# Patient Record
Sex: Male | Born: 1955 | Race: Black or African American | Hispanic: No | Marital: Single | Smoking: Current every day smoker
Health system: Southern US, Community
[De-identification: ages and names within clinical notes are randomized; demographics above are authoritative.]

## PROBLEM LIST (undated history)

## (undated) DIAGNOSIS — N179 Acute kidney failure, unspecified: Secondary | ICD-10-CM

## (undated) DIAGNOSIS — N189 Chronic kidney disease, unspecified: Secondary | ICD-10-CM

## (undated) DIAGNOSIS — J9819 Other pulmonary collapse: Secondary | ICD-10-CM

## (undated) DIAGNOSIS — Z87891 Personal history of nicotine dependence: Secondary | ICD-10-CM

## (undated) DIAGNOSIS — I639 Cerebral infarction, unspecified: Secondary | ICD-10-CM

## (undated) DIAGNOSIS — F32A Depression, unspecified: Secondary | ICD-10-CM

## (undated) DIAGNOSIS — F209 Schizophrenia, unspecified: Secondary | ICD-10-CM

## (undated) DIAGNOSIS — D649 Anemia, unspecified: Secondary | ICD-10-CM

## (undated) DIAGNOSIS — I209 Angina pectoris, unspecified: Secondary | ICD-10-CM

## (undated) DIAGNOSIS — D473 Essential (hemorrhagic) thrombocythemia: Secondary | ICD-10-CM

## (undated) DIAGNOSIS — I739 Peripheral vascular disease, unspecified: Secondary | ICD-10-CM

## (undated) DIAGNOSIS — E079 Disorder of thyroid, unspecified: Secondary | ICD-10-CM

## (undated) DIAGNOSIS — L8990 Pressure ulcer of unspecified site, unspecified stage: Secondary | ICD-10-CM

## (undated) DIAGNOSIS — I10 Essential (primary) hypertension: Secondary | ICD-10-CM

## (undated) DIAGNOSIS — G9341 Metabolic encephalopathy: Secondary | ICD-10-CM

## (undated) DIAGNOSIS — F329 Major depressive disorder, single episode, unspecified: Secondary | ICD-10-CM

## (undated) DIAGNOSIS — I509 Heart failure, unspecified: Secondary | ICD-10-CM

## (undated) DIAGNOSIS — E119 Type 2 diabetes mellitus without complications: Secondary | ICD-10-CM

## (undated) DIAGNOSIS — A419 Sepsis, unspecified organism: Secondary | ICD-10-CM

## (undated) HISTORY — PX: OTHER SURGICAL HISTORY: SHX169

## (undated) HISTORY — DX: Anemia, unspecified: D64.9

## (undated) HISTORY — DX: Chronic kidney disease, unspecified: N18.9

## (undated) HISTORY — DX: Metabolic encephalopathy: G93.41

## (undated) HISTORY — DX: Essential (hemorrhagic) thrombocythemia: D47.3

## (undated) HISTORY — DX: Sepsis, unspecified organism: A41.9

## (undated) HISTORY — DX: Other pulmonary collapse: J98.19

## (undated) HISTORY — DX: Pressure ulcer of unspecified site, unspecified stage: L89.90

## (undated) HISTORY — DX: Chronic kidney disease, unspecified: N17.9

---

## 2015-11-11 DIAGNOSIS — D75839 Thrombocytosis, unspecified: Secondary | ICD-10-CM

## 2015-11-11 DIAGNOSIS — G9341 Metabolic encephalopathy: Secondary | ICD-10-CM

## 2015-11-11 DIAGNOSIS — A419 Sepsis, unspecified organism: Secondary | ICD-10-CM

## 2015-11-11 HISTORY — DX: Sepsis, unspecified organism: A41.9

## 2015-11-11 HISTORY — DX: Metabolic encephalopathy: G93.41

## 2015-11-11 HISTORY — DX: Thrombocytosis, unspecified: D75.839

## 2016-02-01 ENCOUNTER — Emergency Department: Payer: Medicare Other

## 2016-02-01 ENCOUNTER — Inpatient Hospital Stay: Payer: Medicare Other

## 2016-02-01 ENCOUNTER — Inpatient Hospital Stay
Admission: EM | Admit: 2016-02-01 | Discharge: 2016-02-14 | DRG: 871 | Disposition: A | Discharge status: Hospice | Payer: Medicare Other | Attending: Internal Medicine | Admitting: Internal Medicine

## 2016-02-01 DIAGNOSIS — I509 Heart failure, unspecified: Secondary | ICD-10-CM | POA: Diagnosis not present

## 2016-02-01 DIAGNOSIS — T17500A Unspecified foreign body in bronchus causing asphyxiation, initial encounter: Secondary | ICD-10-CM | POA: Insufficient documentation

## 2016-02-01 DIAGNOSIS — Z950 Presence of cardiac pacemaker: Secondary | ICD-10-CM

## 2016-02-01 DIAGNOSIS — E876 Hypokalemia: Secondary | ICD-10-CM | POA: Diagnosis present

## 2016-02-01 DIAGNOSIS — E872 Acidosis, unspecified: Secondary | ICD-10-CM

## 2016-02-01 DIAGNOSIS — X58XXXA Exposure to other specified factors, initial encounter: Secondary | ICD-10-CM | POA: Diagnosis present

## 2016-02-01 DIAGNOSIS — Z01818 Encounter for other preprocedural examination: Secondary | ICD-10-CM

## 2016-02-01 DIAGNOSIS — J189 Pneumonia, unspecified organism: Secondary | ICD-10-CM | POA: Diagnosis not present

## 2016-02-01 DIAGNOSIS — F419 Anxiety disorder, unspecified: Secondary | ICD-10-CM | POA: Diagnosis not present

## 2016-02-01 DIAGNOSIS — G9341 Metabolic encephalopathy: Secondary | ICD-10-CM

## 2016-02-01 DIAGNOSIS — J9621 Acute and chronic respiratory failure with hypoxia: Secondary | ICD-10-CM | POA: Diagnosis not present

## 2016-02-01 DIAGNOSIS — N179 Acute kidney failure, unspecified: Secondary | ICD-10-CM | POA: Diagnosis not present

## 2016-02-01 DIAGNOSIS — E878 Other disorders of electrolyte and fluid balance, not elsewhere classified: Secondary | ICD-10-CM | POA: Diagnosis present

## 2016-02-01 DIAGNOSIS — J9690 Respiratory failure, unspecified, unspecified whether with hypoxia or hypercapnia: Secondary | ICD-10-CM

## 2016-02-01 DIAGNOSIS — Z6821 Body mass index (BMI) 21.0-21.9, adult: Secondary | ICD-10-CM

## 2016-02-01 DIAGNOSIS — R627 Adult failure to thrive: Secondary | ICD-10-CM | POA: Diagnosis present

## 2016-02-01 DIAGNOSIS — R0902 Hypoxemia: Secondary | ICD-10-CM

## 2016-02-01 DIAGNOSIS — D696 Thrombocytopenia, unspecified: Secondary | ICD-10-CM | POA: Diagnosis present

## 2016-02-01 DIAGNOSIS — Z515 Encounter for palliative care: Secondary | ICD-10-CM | POA: Diagnosis not present

## 2016-02-01 DIAGNOSIS — Z7982 Long term (current) use of aspirin: Secondary | ICD-10-CM | POA: Diagnosis not present

## 2016-02-01 DIAGNOSIS — A419 Sepsis, unspecified organism: Secondary | ICD-10-CM | POA: Diagnosis present

## 2016-02-01 DIAGNOSIS — J811 Chronic pulmonary edema: Secondary | ICD-10-CM | POA: Diagnosis present

## 2016-02-01 DIAGNOSIS — J159 Unspecified bacterial pneumonia: Secondary | ICD-10-CM | POA: Diagnosis present

## 2016-02-01 DIAGNOSIS — R64 Cachexia: Secondary | ICD-10-CM | POA: Diagnosis present

## 2016-02-01 DIAGNOSIS — R001 Bradycardia, unspecified: Secondary | ICD-10-CM | POA: Diagnosis present

## 2016-02-01 DIAGNOSIS — F17210 Nicotine dependence, cigarettes, uncomplicated: Secondary | ICD-10-CM | POA: Diagnosis present

## 2016-02-01 DIAGNOSIS — T17590A Other foreign object in bronchus causing asphyxiation, initial encounter: Secondary | ICD-10-CM | POA: Diagnosis present

## 2016-02-01 DIAGNOSIS — E860 Dehydration: Secondary | ICD-10-CM | POA: Diagnosis present

## 2016-02-01 DIAGNOSIS — R0603 Acute respiratory distress: Secondary | ICD-10-CM

## 2016-02-01 DIAGNOSIS — J9620 Acute and chronic respiratory failure, unspecified whether with hypoxia or hypercapnia: Secondary | ICD-10-CM | POA: Diagnosis not present

## 2016-02-01 DIAGNOSIS — J9819 Other pulmonary collapse: Secondary | ICD-10-CM | POA: Diagnosis not present

## 2016-02-01 DIAGNOSIS — J181 Lobar pneumonia, unspecified organism: Secondary | ICD-10-CM

## 2016-02-01 DIAGNOSIS — L8990 Pressure ulcer of unspecified site, unspecified stage: Secondary | ICD-10-CM | POA: Diagnosis not present

## 2016-02-01 DIAGNOSIS — R6521 Severe sepsis with septic shock: Secondary | ICD-10-CM | POA: Diagnosis present

## 2016-02-01 DIAGNOSIS — Z9889 Other specified postprocedural states: Secondary | ICD-10-CM

## 2016-02-01 DIAGNOSIS — D638 Anemia in other chronic diseases classified elsewhere: Secondary | ICD-10-CM | POA: Diagnosis present

## 2016-02-01 DIAGNOSIS — N170 Acute kidney failure with tubular necrosis: Secondary | ICD-10-CM | POA: Diagnosis present

## 2016-02-01 DIAGNOSIS — Z8673 Personal history of transient ischemic attack (TIA), and cerebral infarction without residual deficits: Secondary | ICD-10-CM | POA: Diagnosis not present

## 2016-02-01 DIAGNOSIS — J9809 Other diseases of bronchus, not elsewhere classified: Secondary | ICD-10-CM | POA: Diagnosis not present

## 2016-02-01 DIAGNOSIS — E870 Hyperosmolality and hypernatremia: Secondary | ICD-10-CM | POA: Diagnosis present

## 2016-02-01 DIAGNOSIS — Z4659 Encounter for fitting and adjustment of other gastrointestinal appliance and device: Secondary | ICD-10-CM

## 2016-02-01 DIAGNOSIS — Z66 Do not resuscitate: Secondary | ICD-10-CM | POA: Diagnosis not present

## 2016-02-01 DIAGNOSIS — J9600 Acute respiratory failure, unspecified whether with hypoxia or hypercapnia: Secondary | ICD-10-CM | POA: Diagnosis not present

## 2016-02-01 DIAGNOSIS — R531 Weakness: Secondary | ICD-10-CM

## 2016-02-01 DIAGNOSIS — Z79899 Other long term (current) drug therapy: Secondary | ICD-10-CM

## 2016-02-01 DIAGNOSIS — D649 Anemia, unspecified: Secondary | ICD-10-CM

## 2016-02-01 HISTORY — DX: Personal history of nicotine dependence: Z87.891

## 2016-02-01 LAB — COMPREHENSIVE METABOLIC PANEL
ALBUMIN: 3.5 g/dL (ref 3.5–5.0)
ALT: 47 U/L (ref 17–63)
AST: 56 U/L — AB (ref 15–41)
Alkaline Phosphatase: 79 U/L (ref 38–126)
BUN: 79 mg/dL — AB (ref 6–20)
CALCIUM: 8.7 mg/dL — AB (ref 8.9–10.3)
CO2: 24 mmol/L (ref 22–32)
CREATININE: 2.17 mg/dL — AB (ref 0.61–1.24)
Chloride: >130 mmol/L (ref 101–111)
GFR calc non Af Amer: 32 mL/min — ABNORMAL LOW (ref >60)
GFR, EST AFRICAN AMERICAN: 37 mL/min — AB (ref >60)
GLUCOSE: 144 mg/dL — AB (ref 65–99)
Potassium: 3.7 mmol/L (ref 3.5–5.1)
SODIUM: 172 mmol/L — AB (ref 135–145)
Total Bilirubin: 1.1 mg/dL (ref 0.3–1.2)
Total Protein: 7.1 g/dL (ref 6.5–8.1)

## 2016-02-01 LAB — NA AND K (SODIUM & POTASSIUM), RAND UR: POTASSIUM UR: 50 mmol/L

## 2016-02-01 LAB — URINALYSIS COMPLETE WITH MICROSCOPIC (ARMC ONLY)
Bilirubin Urine: NEGATIVE
Glucose, UA: NEGATIVE mg/dL
Hgb urine dipstick: NEGATIVE
KETONES UR: NEGATIVE mg/dL
Leukocytes, UA: NEGATIVE
Nitrite: NEGATIVE
PH: 5 (ref 5.0–8.0)
PROTEIN: NEGATIVE mg/dL
RBC / HPF: NONE SEEN RBC/hpf (ref 0–5)
SQUAMOUS EPITHELIAL / LPF: NONE SEEN
Specific Gravity, Urine: 1.014 (ref 1.005–1.030)
WBC, UA: NONE SEEN WBC/hpf (ref 0–5)

## 2016-02-01 LAB — BLOOD GAS, ARTERIAL
ACID-BASE DEFICIT: 3.1 mmol/L — AB (ref 0.0–2.0)
Allens test (pass/fail): POSITIVE — AB
BICARBONATE: 21.1 meq/L (ref 21.0–28.0)
FIO2: 1
O2 Saturation: 91.4 %
PH ART: 7.4 (ref 7.350–7.450)
Patient temperature: 37
pCO2 arterial: 34 mmHg (ref 32.0–48.0)
pO2, Arterial: 62 mmHg — ABNORMAL LOW (ref 83.0–108.0)

## 2016-02-01 LAB — CBC WITH DIFFERENTIAL/PLATELET
BASOS ABS: 0 10*3/uL (ref 0–0.1)
Basophils Relative: 0 %
EOS PCT: 0 %
Eosinophils Absolute: 0 10*3/uL (ref 0–0.7)
HEMATOCRIT: 35.2 % — AB (ref 40.0–52.0)
Hemoglobin: 11.5 g/dL — ABNORMAL LOW (ref 13.0–18.0)
LYMPHS ABS: 0.5 10*3/uL — AB (ref 1.0–3.6)
LYMPHS PCT: 9 %
MCH: 31 pg (ref 26.0–34.0)
MCHC: 32.7 g/dL (ref 32.0–36.0)
MCV: 94.8 fL (ref 80.0–100.0)
MONOS PCT: 7 %
Monocytes Absolute: 0.4 10*3/uL (ref 0.2–1.0)
Neutro Abs: 4.5 10*3/uL (ref 1.4–6.5)
Neutrophils Relative %: 84 %
PLATELETS: 141 10*3/uL — AB (ref 150–440)
RBC: 3.72 MIL/uL — ABNORMAL LOW (ref 4.40–5.90)
RDW: 16.2 % — AB (ref 11.5–14.5)
WBC: 5.4 10*3/uL (ref 3.8–10.6)

## 2016-02-01 LAB — BASIC METABOLIC PANEL
BUN: 80 mg/dL — AB (ref 6–20)
BUN: 80 mg/dL — ABNORMAL HIGH (ref 6–20)
BUN: 81 mg/dL — ABNORMAL HIGH (ref 6–20)
CO2: 23 mmol/L (ref 22–32)
CO2: 19 mmol/L — ABNORMAL LOW (ref 22–32)
CO2: 21 mmol/L — ABNORMAL LOW (ref 22–32)
CREATININE: 2.3 mg/dL — AB (ref 0.61–1.24)
CREATININE: 2.49 mg/dL — AB (ref 0.61–1.24)
Calcium: 8.2 mg/dL — ABNORMAL LOW (ref 8.9–10.3)
Calcium: 8.3 mg/dL — ABNORMAL LOW (ref 8.9–10.3)
Calcium: 8.1 mg/dL — ABNORMAL LOW (ref 8.9–10.3)
Chloride: >130 mmol/L (ref 101–111)
Creatinine, Ser: 2.19 mg/dL — ABNORMAL HIGH (ref 0.61–1.24)
GFR calc non Af Amer: 30 mL/min — ABNORMAL LOW (ref >60)
GFR calc non Af Amer: 27 mL/min — ABNORMAL LOW (ref >60)
GFR, EST AFRICAN AMERICAN: 36 mL/min — AB (ref >60)
GFR, EST AFRICAN AMERICAN: 34 mL/min — AB (ref >60)
GFR, EST AFRICAN AMERICAN: 31 mL/min — AB (ref >60)
GFR, EST NON AFRICAN AMERICAN: 31 mL/min — AB (ref >60)
Glucose, Bld: 177 mg/dL — ABNORMAL HIGH (ref 65–99)
Glucose, Bld: 147 mg/dL — ABNORMAL HIGH (ref 65–99)
Glucose, Bld: 156 mg/dL — ABNORMAL HIGH (ref 65–99)
POTASSIUM: 3.3 mmol/L — AB (ref 3.5–5.1)
POTASSIUM: 3.3 mmol/L — AB (ref 3.5–5.1)
Potassium: 3 mmol/L — ABNORMAL LOW (ref 3.5–5.1)
SODIUM: 169 mmol/L — AB (ref 135–145)
SODIUM: 167 mmol/L — AB (ref 135–145)
SODIUM: 167 mmol/L — AB (ref 135–145)

## 2016-02-01 LAB — URINE DRUG SCREEN, QUALITATIVE (ARMC ONLY)
Amphetamines, Ur Screen: NOT DETECTED
BARBITURATES, UR SCREEN: NOT DETECTED
BENZODIAZEPINE, UR SCRN: NOT DETECTED
COCAINE METABOLITE, UR ~~LOC~~: NOT DETECTED
Cannabinoid 50 Ng, Ur ~~LOC~~: NOT DETECTED
MDMA (Ecstasy)Ur Screen: NOT DETECTED
METHADONE SCREEN, URINE: NOT DETECTED
Opiate, Ur Screen: NOT DETECTED
Phencyclidine (PCP) Ur S: NOT DETECTED
TRICYCLIC, UR SCREEN: NOT DETECTED

## 2016-02-01 LAB — MRSA PCR SCREENING: MRSA BY PCR: NEGATIVE

## 2016-02-01 LAB — OSMOLALITY, URINE: Osmolality, Ur: 416 mOsm/kg (ref 300–900)

## 2016-02-01 LAB — LACTIC ACID, PLASMA
LACTIC ACID, VENOUS: 2.5 mmol/L — AB (ref 0.5–2.0)
LACTIC ACID, VENOUS: 1.5 mmol/L (ref 0.5–2.0)

## 2016-02-01 LAB — INFLUENZA PANEL BY PCR (TYPE A & B)
H1N1 flu by pcr: NOT DETECTED
Influenza A By PCR: NEGATIVE
Influenza B By PCR: NEGATIVE

## 2016-02-01 LAB — OSMOLALITY: OSMOLALITY: 387 mosm/kg — AB (ref 275–295)

## 2016-02-01 LAB — GLUCOSE, CAPILLARY: Glucose-Capillary: 125 mg/dL — ABNORMAL HIGH (ref 65–99)

## 2016-02-01 LAB — TROPONIN I: Troponin I: <0.03 ng/mL (ref <0.031)

## 2016-02-01 LAB — ETHANOL: Alcohol, Ethyl (B): <5 mg/dL (ref <5)

## 2016-02-01 MED ORDER — LORAZEPAM 2 MG/ML IJ SOLN: 0.5000 mg | Freq: Four times a day (QID) | INTRAMUSCULAR | Status: DC | PRN

## 2016-02-01 MED ORDER — SODIUM CHLORIDE 0.45 % IV SOLN
INTRAVENOUS | Status: DC
  Administered 2016-02-01: 17:00:00 via INTRAVENOUS

## 2016-02-01 MED ORDER — FENTANYL CITRATE (PF) 100 MCG/2ML IJ SOLN
INTRAMUSCULAR | Status: AC
  Filled 2016-02-01: qty 4

## 2016-02-01 MED ORDER — VANCOMYCIN HCL IN DEXTROSE 750-5 MG/150ML-% IV SOLN
750.0000 mg | INTRAVENOUS | Status: DC
Start: 2016-02-02 — End: 2016-02-03
  Administered 2016-02-02 – 2016-02-03 (×2): 750 mg via INTRAVENOUS
  Filled 2016-02-01 (×3): qty 150

## 2016-02-01 MED ORDER — PIPERACILLIN-TAZOBACTAM 3.375 G IVPB 30 MIN: 3.3750 g | Freq: Once | INTRAVENOUS | Status: DC

## 2016-02-01 MED ORDER — SODIUM CHLORIDE 0.9 % IV BOLUS (SEPSIS)
1000.0000 mL | Freq: Once | INTRAVENOUS | Status: AC
  Administered 2016-02-01: 1000 mL via INTRAVENOUS

## 2016-02-01 MED ORDER — HEPARIN SODIUM (PORCINE) 5000 UNIT/ML IJ SOLN
5000.0000 [IU] | Freq: Three times a day (TID) | INTRAMUSCULAR | Status: DC
  Administered 2016-02-01 – 2016-02-03 (×6): 5000 [IU] via SUBCUTANEOUS
  Filled 2016-02-01 (×6): qty 1

## 2016-02-01 MED ORDER — SODIUM CHLORIDE 0.9% FLUSH
3.0000 mL | Freq: Two times a day (BID) | INTRAVENOUS | Status: DC
  Administered 2016-02-02 – 2016-02-13 (×22): 3 mL via INTRAVENOUS

## 2016-02-01 MED ORDER — VANCOMYCIN HCL IN DEXTROSE 1-5 GM/200ML-% IV SOLN: 1000.0000 mg | Freq: Once | INTRAVENOUS | Status: DC

## 2016-02-01 MED ORDER — ONDANSETRON HCL 4 MG PO TABS: 4.0000 mg | ORAL_TABLET | Freq: Four times a day (QID) | ORAL | Status: DC | PRN

## 2016-02-01 MED ORDER — PANTOPRAZOLE SODIUM 40 MG IV SOLR
40.0000 mg | INTRAVENOUS | Status: DC
  Administered 2016-02-01 – 2016-02-07 (×7): 40 mg via INTRAVENOUS
  Filled 2016-02-01 (×7): qty 40

## 2016-02-01 MED ORDER — DEXTROSE 5 % IV SOLN
1.0000 g | Freq: Once | INTRAVENOUS | Status: AC
  Administered 2016-02-01: 1 g via INTRAVENOUS
  Filled 2016-02-01: qty 10

## 2016-02-01 MED ORDER — ONDANSETRON HCL 4 MG/2ML IJ SOLN: 4.0000 mg | Freq: Four times a day (QID) | INTRAMUSCULAR | Status: DC | PRN

## 2016-02-01 MED ORDER — IPRATROPIUM BROMIDE 0.02 % IN SOLN
0.5000 mg | RESPIRATORY_TRACT | Status: DC
  Administered 2016-02-01 – 2016-02-03 (×10): 0.5 mg via RESPIRATORY_TRACT
  Filled 2016-02-01 (×10): qty 2.5

## 2016-02-01 MED ORDER — DEXTROSE 5 % IV SOLN
500.0000 mg | INTRAVENOUS | Status: DC
  Administered 2016-02-02 – 2016-02-05 (×4): 500 mg via INTRAVENOUS
  Filled 2016-02-01 (×5): qty 500

## 2016-02-01 MED ORDER — ALBUTEROL SULFATE (2.5 MG/3ML) 0.083% IN NEBU
2.5000 mg | INHALATION_SOLUTION | RESPIRATORY_TRACT | Status: DC
  Administered 2016-02-01 – 2016-02-03 (×10): 2.5 mg via RESPIRATORY_TRACT
  Filled 2016-02-01 (×10): qty 3

## 2016-02-01 MED ORDER — DEXTROSE 5 % IV SOLN
500.0000 mg | Freq: Once | INTRAVENOUS | Status: AC
  Administered 2016-02-01: 500 mg via INTRAVENOUS
  Filled 2016-02-01: qty 500

## 2016-02-01 MED ORDER — POTASSIUM CHLORIDE 10 MEQ/100ML IV SOLN
10.0000 meq | INTRAVENOUS | Status: AC
  Administered 2016-02-01 (×4): 10 meq via INTRAVENOUS
  Filled 2016-02-01 (×4): qty 100

## 2016-02-01 MED ORDER — VANCOMYCIN HCL IN DEXTROSE 1-5 GM/200ML-% IV SOLN
1000.0000 mg | INTRAVENOUS | Status: AC
  Administered 2016-02-01: 1000 mg via INTRAVENOUS
  Filled 2016-02-01: qty 200

## 2016-02-01 MED ORDER — NOREPINEPHRINE BITARTRATE 1 MG/ML IV SOLN: 2.0000 ug/min | INTRAVENOUS | Status: DC

## 2016-02-01 MED ORDER — SODIUM CHLORIDE 0.9 % IV SOLN
750.0000 mg | Freq: Two times a day (BID) | INTRAVENOUS | Status: DC
  Administered 2016-02-02 – 2016-02-08 (×14): 750 mg via INTRAVENOUS
  Filled 2016-02-01 (×18): qty 7.5

## 2016-02-01 MED ORDER — DEXTROSE 5 % IV SOLN
Freq: Once | INTRAVENOUS | Status: AC
  Administered 2016-02-01: 20:00:00 via INTRAVENOUS

## 2016-02-01 MED ORDER — SODIUM CHLORIDE 0.9 % IV SOLN: INTRAVENOUS | Status: DC

## 2016-02-01 MED ORDER — LORAZEPAM 2 MG/ML IJ SOLN
0.2500 mg | Freq: Two times a day (BID) | INTRAMUSCULAR | Status: DC
  Administered 2016-02-02 – 2016-02-07 (×7): 0.25 mg via INTRAVENOUS
  Filled 2016-02-01 (×8): qty 1

## 2016-02-01 MED ORDER — PIPERACILLIN-TAZOBACTAM 3.375 G IVPB
3.3750 g | Freq: Three times a day (TID) | INTRAVENOUS | Status: DC
  Administered 2016-02-01 – 2016-02-10 (×26): 3.375 g via INTRAVENOUS
  Filled 2016-02-01 (×31): qty 50

## 2016-02-01 MED ORDER — NOREPINEPHRINE 4 MG/250ML-% IV SOLN
2.0000 ug/min | INTRAVENOUS | Status: DC
Start: 2016-02-01 — End: 2016-02-03
  Administered 2016-02-02: 2 ug/min via INTRAVENOUS
  Filled 2016-02-01: qty 250

## 2016-02-01 MED ORDER — MIDAZOLAM HCL 2 MG/2ML IJ SOLN
INTRAMUSCULAR | Status: AC
  Filled 2016-02-01: qty 4

## 2016-02-01 MED ORDER — RISPERIDONE 1 MG PO TBDP
1.0000 mg | ORAL_TABLET | Freq: Two times a day (BID) | ORAL | Status: DC
  Administered 2016-02-04 – 2016-02-05 (×2): 1 mg via ORAL
  Filled 2016-02-01 (×8): qty 1

## 2016-02-01 MED ORDER — ASPIRIN 300 MG RE SUPP
300.0000 mg | Freq: Every day | RECTAL | Status: DC
  Administered 2016-02-02 – 2016-02-05 (×4): 300 mg via RECTAL
  Filled 2016-02-01 (×4): qty 1

## 2016-02-01 MED ORDER — DEXTROSE 5 % IV SOLN
Freq: Once | INTRAVENOUS | Status: AC
  Administered 2016-02-01: 12:00:00 via INTRAVENOUS

## 2016-02-01 NOTE — Progress Notes (Signed)
Pharmacy Antibiotic Note  [name] is a 60 y.o. male admitted on 02/01/2016 with sepsis.  Pharmacy has been consulted for vancomycin and Zosyn dosing.  Plan: Vancomycin 1g IV STAT. Will continue vancomycin 750mg  IV Q24hr for goal trough of 15-20. Will obtain trough prior to dose on 3/28.    Will continue Zosyn EI 3.375g IV Q8hr.    Height: 5\' 7"  (170.2 cm) Weight: 128 lb 11.2 oz (58.378 kg) IBW/kg (Calculated) : 66.1  Temp (24hrs), Avg:96.7 F (35.9 C), Min:93.1 F (33.9 C), Max:99.1 F (37.3 C)   Recent Labs Lab 02/01/16 0936 02/01/16 1124 02/01/16 1239  WBC 5.4  --   --   CREATININE 2.17* 2.19*  --   LATICACIDVEN 2.5*  --  1.5    Estimated Creatinine Clearance: 30.4 mL/min (by C-G formula based on Cr of 2.19).    No Known Allergies  Antimicrobials this admission: Vancomycin 3/24 >>  Zosyn 3/24 >>  Azithromycin 3/24 >>  Ceftriaxone 3/24>>3/24   Microbiology results: 3/24 BCx x 2: pending  3/24 UCx: pending  3/24 Sputum: pending 3/24 MRSA PCR: pending  3/24 Influenza: pending  Pharmacy will continue to monitor and adjust per consult.   Simpson,Michael L 02/01/2016 3:58 PM

## 2016-02-01 NOTE — ED Notes (Signed)
Pt to ultrasound

## 2016-02-01 NOTE — ED Provider Notes (Signed)
[facility] Emergency Department Provider Note  Time seen: 9:14 AM  I have reviewed the triage vital signs and the nursing notes.   HISTORY  Chief Complaint No chief complaint on file.    HPI [name] is a 60 y.o. male with no known past medical history who presents the emergency department unresponsive by EMS. According to EMS there were called to a group home due to somnolence/unresponsiveness. EMS states the group home worker could not give any history as far as what his baseline is but states that he is normally able to care for himself. For last 2 days since yesterday the patient has been very somnolent and largely unresponsive. Upon arrival the patient keeps his eyes closed, is able to answer occasional questions, will follow very simple commands, is largely noncooperative during exam. When asked if he is in pain the patient states no. Patient is very somnolent, minimally responsive to questioning and commands.    No past medical history on file.  There are no active problems to display for this patient.   No past surgical history on file.  No current outpatient prescriptions on file.  Allergies Review of patient's allergies indicates not on file.  No family history on file.  Social History Social History  Substance Use Topics  . Smoking status: Not on file  . Smokeless tobacco: Not on file  . Alcohol Use: Not on file    Review of Systems Patient not able to adequately answer review of systems questioning.  ____________________________________________   PHYSICAL EXAM:  Constitutional: Somnolent, keeps eyes closed, will occasionally follow a very basic commands, will answer occasionally simple questions. Alert at times, mostly keeps his eyes closed. Eyes: 23 mm, PERRL ENT   Head: Normocephalic and atraumatic.   Mouth/Throat: Dry mucous membranes Cardiovascular: Normal rate, regular rhythm.  Respiratory: Normal respiratory  effort without tachypnea nor retractions. Breath sounds are clear. No rales, rhonchi, wheeze. Gastrointestinal: Soft and nontender. No distention.   Musculoskeletal: Nontender with normal range of motion in all extremities.  Neurologic:  Somnolent, minimal effort with examination, does appear to move extremities at times. Skin:  Skin is warm, dry and intact.  Psychiatric: Somnolent, altered.  ____________________________________________    EKG  EKG reviewed and interpreted by myself shows normal sinus rhythm 82 bpm, wide QRS, left axis deviation, possibly paced although no pacer spikes noted. Patient does appear to have mild ST elevations in 3 and aVF less than 2 mm, with ST changes and 1, aVL, V5, V6 with ST depressions in V5, V6.  ____________________________________________    RADIOLOGY  CT head shows no acute abnormality Chest x-ray shows right lower lobe pneumonia.  ____________________________________________ CRITICAL CARE Performed by: Harvest Dark   Total critical care time: 60 minutes  Critical care time was exclusive of separately billable procedures and treating other patients.  Critical care was necessary to treat or prevent imminent or life-threatening deterioration.  Critical care was time spent personally by me on the following activities: development of treatment plan with patient and/or surrogate as well as nursing, discussions with consultants, evaluation of patient's response to treatment, examination of patient, obtaining history from patient or surrogate, ordering and performing treatments and interventions, ordering and review of laboratory studies, ordering and review of radiographic studies, pulse oximetry and re-evaluation of patient's condition.      INITIAL IMPRESSION / ASSESSMENT AND PLAN / ED COURSE  Pertinent labs & imaging results that were available during my care of the patient were reviewed  by me and considered in my medical decision  making (see chart for details).  Patient presents with acute altered mental status since yesterday. Upon arrival the patient is very somnolent, will occasionally answer yes or no questions, but otherwise does not speak. Can follow very basic commands such as move hands or squeeze fingers, but has minimal effort and often will not follow commands. Patient found to be quite hypotensive, he has very dry mucous membranes. We have no history on this patient including past medical history. Patient is unable to give a history and his current state. We will check labs, CT head, chest x-ray, begin IV hydrating, and we'll closely monitor in the emergency department.   ----------------------------------------- 9:36 AM on 02/01/2016 -----------------------------------------  Patient is vitals show room air oxygen saturations 78%, 6 L increases to 82%. On a nonrebreather the patient's currently satting 100%. Patient's blood pressure initially quite hypotensive 75/57. After 1 L of normal saline the patient's blood pressure is 120/69. The patient is more responsive at this time. He is able to answer some questions, denies any pain, denies chest pain, denies cough or trouble breathing. Patient remained somnolent however. Patient is able to squeeze my hands and follow simple commands. Appears to be improving with IV fluids and oxygen. Currently awaiting labs, chest x-ray, EKG, CT head. At this time I do not believe the patient needs endotracheal intubation as he appears to be protecting his airway adequately, answering simple questions and following simple commands, However we will continue to very closely monitor the situation.   ----------------------------------------- 10:03 AM on 02/01/2016 -----------------------------------------  Discussed EKG findings with Dr. Fletcher Anon, given his AICD, as well as the patient does not describe any chest pain at this time we will hold off on catheter lab intervention until we  have a more clear clinical picture with laboratory results. We do not have any old EKGs for comparison. 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  ----------------------------------------- 10:39 AM on 02/01/2016 -----------------------------------------  X-ray showing right lower lobe pneumonia, given his initial hypotension I have activated sepsis protocols. We will cover with antibiotics, continue IV hydration to a total of 2 L, and admit to the hospital.  ----------------------------------------- 12:06 PM on 02/01/2016 -----------------------------------------  Patient remains hypotensive, receiving IV fluids however we do not want to drop his sodium too quickly. Patient continues to be able answer questions and follow simple commands. Mean arterial pressure is in the 60-65 range. If the patient's pressure work to lower he would likely require pressors, however this point he is maintaining well.  ____________________________________________   FINAL CLINICAL IMPRESSION(S) / ED DIAGNOSES  Altered mental status Right lower lobe pneumonia  Harvest Dark, MD 02/01/16 1208

## 2016-02-01 NOTE — Consult Note (Signed)
[facility] CONSULT NOTE    Date: 02/01/2016                  Patient Name:  [name]  MRN: [id number]  DOB: [birth year]  Age / Sex: 60 y.o., male         PCP: Vista Mink, FNP                 Service Requesting Consult: Dr. Ether Griffins                 Reason for Consult: Acute renal failure.            History of Present Illness: Patient is a 60 y.o. male with no significant past medical history , who was admitted to [facility] on 02/01/2016 for evaluation of altered mental status.  He was found have severe metabolic derangements upon admission. He was found to have a right middle lobe pneumonia. His serum sodium was very high at 167 with chloride greater than 130. BUN was high at 80 with a creatinine of 2.1. Urine output only 100 cc thus far.  Case was discussed with the patient's daughter in depth. The patient is DO NOT RESUSCITATE status however the patient's daughter would be open to a trial of renal replacement therapy if deemed necessary. He is currently receiving half-normal saline for treatment of his severe hypernatremia.   Medications: Outpatient medications: Prescriptions prior to admission  Medication Sig Dispense Refill Last Dose  . aspirin 325 MG tablet Take 325 mg by mouth daily.   unknown at unknown  . carvedilol (COREG) 6.25 MG tablet Take 6.25 mg by mouth 2 (two) times daily with a meal.   unknown at unknown  . fluticasone (FLONASE) 50 MCG/ACT nasal spray Place 1 spray into both nostrils daily.   unknown at unknown  . levETIRAcetam (KEPPRA) 100 MG/ML solution Take 750 mg by mouth 2 (two) times daily.   unknown at unknown  . loratadine (CLARITIN) 10 MG tablet Take 10 mg by mouth daily.   unknown at unknown  . LORazepam (ATIVAN) 0.5 MG tablet Take 0.25 mg by mouth 2 (two) times daily.   unknown at unknown  . LORazepam (ATIVAN) 1 MG tablet Take 0.5 mg by mouth 2 (two) times daily as needed for anxiety (and agitation).   PRN at PRN  .  risperiDONE (RISPERDAL) 1 MG tablet Take 1 mg by mouth 2 (two) times daily.   unknown at unknown  . sertraline (ZOLOFT) 100 MG tablet Take 150 mg by mouth every morning.   unknown at unknown    Current medications: Current Facility-Administered Medications  Medication Dose Route Frequency Provider Last Rate Last Dose  . 0.45 % sodium chloride infusion   Intravenous Continuous Laverle Hobby, MD 250 mL/hr at 02/01/16 1723    . albuterol (PROVENTIL) (2.5 MG/3ML) 0.083% nebulizer solution 2.5 mg  2.5 mg Nebulization Q4H Theodoro Grist, MD   2.5 mg at 02/01/16 1707  . aspirin suppository 300 mg  300 mg Rectal Daily Theodoro Grist, MD      . Derrill Memo ON 02/02/2016] azithromycin (ZITHROMAX) 500 mg in dextrose 5 % 250 mL IVPB  500 mg Intravenous Q24H Crystal G Scarpena, RPH      . heparin injection 5,000 Units  5,000 Units Subcutaneous 3 times per day Theodoro Grist, MD   5,000 Units at 02/01/16 1654  . ipratropium (ATROVENT) nebulizer solution 0.5 mg  0.5 mg Nebulization Q4H Theodoro Grist, MD   0.5 mg at  02/01/16 1708  . levETIRAcetam (KEPPRA) 750 mg in sodium chloride 0.9 % 100 mL IVPB  750 mg Intravenous Q12H Theodoro Grist, MD      . LORazepam (ATIVAN) injection 0.25 mg  0.25 mg Intravenous Q12H Theodoro Grist, MD      . LORazepam (ATIVAN) injection 0.5 mg  0.5 mg Intravenous Q6H PRN Theodoro Grist, MD      . norepinephrine (LEVOPHED) 4mg  in D5W 263mL premix infusion  2 mcg/min Intravenous Titrated Theodoro Grist, MD      . ondansetron (ZOFRAN) tablet 4 mg  4 mg Oral Q6H PRN Theodoro Grist, MD       Or  . ondansetron (ZOFRAN) injection 4 mg  4 mg Intravenous Q6H PRN Theodoro Grist, MD      . pantoprazole (PROTONIX) injection 40 mg  40 mg Intravenous Q24H Laverle Hobby, MD   40 mg at 02/01/16 1654  . piperacillin-tazobactam (ZOSYN) IVPB 3.375 g  3.375 g Intravenous 3 times per day Theodoro Grist, MD   3.375 g at 02/01/16 1655  . risperiDONE (RISPERDAL M-TABS) disintegrating tablet 1 mg  1 mg Oral  BID Theodoro Grist, MD      . sodium chloride flush (NS) 0.9 % injection 3 mL  3 mL Intravenous Q12H Theodoro Grist, MD      . vancomycin (VANCOCIN) IVPB 1000 mg/200 mL premix  1,000 mg Intravenous STAT Theodoro Grist, MD   1,000 mg at 02/01/16 1655  . [START ON 02/02/2016] vancomycin (VANCOCIN) IVPB 750 mg/150 ml premix  750 mg Intravenous Q24H Charlett Nose, RPH          Allergies: No Known Allergies    Past Medical History: No known past medical history.  Past Surgical History: No known past surgical history.  Family History: Unable to obtain from the patient as he has altered mental status.  Social History: Unable to obtain from the patient due to altered mental status.   Review of Systems: Unable to obtain from the patient due to altered mental status.  Vital Signs: Blood pressure 81/55, pulse 56, temperature 99.6 F (37.6 C), temperature source Oral, resp. rate 32, height 5\' 7"  (1.702 m), weight 58.378 kg (128 lb 11.2 oz), SpO2 95 %.  Weight trends: Filed Weights   02/01/16 0913  Weight: 58.378 kg (128 lb 11.2 oz)    Physical Exam: General: Critically ill appearing  Head: Normocephalic, atraumatic.  Eyes: Anicteric, spontaneous EOMs noted  Nose: Mucous membranes dry, not inflammed, nonerythematous.  Throat: Oropharynx nonerythematous, no exudate appreciated.  Oral mucosa very dry  Neck: Supple, trachea midline.  Lungs:  Right basilar rales  Heart: S1S2 no rubs  Abdomen:  BS normoactive. Soft, Nondistended, non-tender.  No masses or organomegaly.  Extremities: No pretibial edema.  Neurologic: Obtunded, not following commands  Skin: No visible rashes, scars.    Lab results: Basic Metabolic Panel:  Recent Labs Lab 02/01/16 0936 02/01/16 1124 02/01/16 1554  NA 172* 169* 167*  K 3.7 3.3* 3.3*  CL >130* >130* >130*  CO2 24 23 19*  GLUCOSE 144* 177* 147*  BUN 79* 80* 80*  CREATININE 2.17* 2.19* 2.30*  CALCIUM 8.7* 8.2* 8.3*    Liver Function  Tests:  Recent Labs Lab 02/01/16 0936  AST 56*  ALT 47  ALKPHOS 79  BILITOT 1.1  PROT 7.1  ALBUMIN 3.5   No results for input(s): LIPASE, AMYLASE in the last 168 hours. No results for input(s): AMMONIA in the last 168 hours.  CBC:  Recent Labs  Lab 02/01/16 0936  WBC 5.4  NEUTROABS 4.5  HGB 11.5*  HCT 35.2*  MCV 94.8  PLT 141*    Cardiac Enzymes:  Recent Labs Lab 02/01/16 0936  TROPONINI <0.03    BNP: Invalid input(s): POCBNP  CBG:  Recent Labs Lab 02/01/16 1552  GLUCAP 125*    Microbiology: No results found for this or any previous visit.  Coagulation Studies: No results for input(s): LABPROT, INR in the last 72 hours.  Urinalysis:  Recent Labs  02/01/16 0936  COLORURINE YELLOW*  LABSPEC 1.014  PHURINE 5.0  GLUCOSEU NEGATIVE  HGBUR NEGATIVE  BILIRUBINUR NEGATIVE  KETONESUR NEGATIVE  PROTEINUR NEGATIVE  NITRITE NEGATIVE  LEUKOCYTESUR NEGATIVE      Imaging: Ct Head Wo Contrast  02/01/2016  CLINICAL DATA:  Unresponsive EXAM: CT HEAD WITHOUT CONTRAST TECHNIQUE: Contiguous axial images were obtained from the base of the skull through the vertex without intravenous contrast. COMPARISON:  None. FINDINGS: Bony calvarium is intact. 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 Scattered chronic white matter ischemic changes noted. No findings to suggest acute hemorrhage, acute infarction or space-occupying mass lesion are noted. Atrophic changes are again seen. IMPRESSION: Chronic changes without acute abnormality. Electronically Signed   By: Inez Catalina M.D.   On: 02/01/2016 11:08   US Renal  02/01/2016  CLINICAL DATA:  Acute renal failure. EXAM: RENAL / URINARY TRACT ULTRASOUND COMPLETE COMPARISON:  None. FINDINGS: Right Kidney: Length: 9.7 cm. Echogenicity within normal limits. No mass visualized. Slight fullness of the renal pelvis without talus seal dilatation. Left Kidney: Length: 9.9 cm.  Echogenicity within normal limits. No solid mass or hydronephrosis visualized. 1.9 x 2.5 x 1.8 cm upper pole cyst. 8 mm interpolar cyst. Bladder: Decompressed by a Foley catheter. IMPRESSION: 1. Slight fullness of the right renal pelvis without frank hydronephrosis. 2. Left renal cysts. Electronically Signed   By: Logan Bores M.D.   On: 02/01/2016 14:01   Dg Chest Portable 1 View  02/01/2016  CLINICAL DATA:  Altered mental status EXAM: PORTABLE CHEST 1 VIEW COMPARISON:  None. FINDINGS: Cardiac enlargement. Single lead cardiac pacer with generator over the left thorax. Retrocardiac left lower lobe not evaluated on single AP film but there appears to be abnormal opacity in the area. On the right, there is a rounded area of opacity most consistent with infiltrate at the right lung base measuring about 6.5 cm in diameter. IMPRESSION: Right lower lobe infiltrate concerning for pneumonia. Followup PA and lateral chest X-ray is recommended in 3-4 weeks following trial of antibiotic therapy to ensure resolution and exclude underlying malignancy. Retrocardiac left lower lobe not well evaluated. Airspace disease in this area is suspected as well. Electronically Signed   By: Skipper Cliche M.D.   On: 02/01/2016 10:17      Assessment & Plan: Pt is a 60 y.o. male with no significant past medical history , who was admitted to [facility] on 02/01/2016 for evaluation of altered mental status.  He was found have severe metabolic derangements upon admission. He was found to have a right middle lobe pneumonia. His serum sodium was very high at 167 with chloride greater than 130. BUN was high at 80 with a creatinine of 2.1.  1. Acute renal failure secondary to acute tubular necrosis, in the setting of pneumonia. - Unknown baseline renal function. Presented creatinine was 2.1 and is currently up to 2.3. Renal ultrasound was negative for definitive pneumonia. Continue with IV fluid hydration  for now. No urgent indication for  dialysis however if oliguria persists we may need to consider this. The patient's daughter is open to a trial of dialysis if deemed necessary.  2. Severe hypernatremia. Serum sodium currently 167. After half-normal saline volume repletion completed switch the patient to D5W at 150 cc per hour.  3. Right lower lobe bacterial pneumonia. He is on a multitude of antibiotics including Zosyn, vancomycin, ceftriaxone, and azithromycin. Consider nearing antibody regimen.  4. Hypokalemia. Serum potassium low at 3.3. Administer potassium chloride 40 mEq IV.

## 2016-02-01 NOTE — Progress Notes (Signed)
Dr Holley Raring notified of pt's lab results of cl>130 and Na 166. Fluids continue, MD has talked with daughter and states they are open for dialysis tomorrow if no improvement.

## 2016-02-01 NOTE — Consult Note (Signed)
[facility] Consultation     ASSESSMENT/PLAN   The patient is a 60 year old group home resident presents with pneumonia with septic shock. Severe hypernatremia with dehydration and AKI.   PULMONARY A:Acute respiratory failure secondary to pneumonia with sepsis. P:   -We'll treat with broad-spectrum antibiotics for likely healthcare associated pneumonia. -We'll test for influenza as well as a MRSA screen. Sputum cultures if possible. -Wean down oxygen as tolerated, will monitor patient's respiration status closely. Should he decompensate. He may require intubation.  CARDIOVASCULAR -Septic shock due to pneumonia, with hypotension P:  -Patient has received 3 L of IV fluids, appears fluid responsive, continue rehydration. -Pressors if needed. -Will consider am cortisol level if BP remains low.   RENAL A:  Acute kidney injury. -Severe hypernatremia, likely secondary to dehydration. P:   -Continue IV rehydration with saline. -Check urine and serum osmolality, monitor serum sodium frequently and adjust infusions as needed. -Nephrology consulted.  GASTROINTESTINAL A:  Will add H2 PPI   HEMATOLOGIC A:  --  INFECTIOUS A:  Pneumonia with sepsis. P:   Continue antibiotics. -Monitor lactic acid. -Await cultures, MRSA screen, influenza screen.  BCx2 3/242: Pending UC 3/24: Pending Sputum-- Vancomycin 3/24>> Zosyn 3/20.>>  ENDOCRINE A: --  NEUROLOGIC A: Acute metabolic encephalopathy, likely due to septic shock as well as acute hypernatremia. -Continue treatment of underlying metabolic abnormalities.  MAJOR EVENTS/TEST RESULTS:     ---------------------------------------  ---------------------------------------   Name: Bubba Daykin MRN: [id number] DOB: [birth year]    ADMISSION DATE:  02/01/2016 CONSULTATION DATE:  02/01/16  REFERRING MD : Dr. Ether Griffins  CHIEF COMPLAINT:  Dyspnea.    HISTORY OF PRESENT ILLNESS:    The patient is a  60 year old male, he is currently minimally responsive. Therefore, all history is obtained from the chart and from staff. The patient presented from his group home because of diminished responsiveness.  Upon initial evaluation, his sodium level was significant for 172, creatinine 2.17, troponin negative.  CT head results. 3/24, no acute changes. Chest x-ray portable. 3/24: Images reviewed, consistent with right middle lobe for his right lower lobe pneumonia. Initial lactic acid is 2.5  Per the H&P: Apparently patient is a resident of group home, usually is independent in his activities, however, since yesterday, was noted to be laying around and is eating somnolent and today he became poorly responsive, he is he was brought to emergency room for further evaluation and treatment, chest x-ray revealed right lower lobe pneumonia, he was hypotensive, systolic blood pressure in 70s on arrival to the hospital, also hypothermic, he was given 2 L of IV fluids   PAST MEDICAL HISTORY :  No past medical history on file. No past surgical history on file. Prior to Admission medications   Medication Sig Start Date End Date Taking? Authorizing Provider  aspirin 325 MG tablet Take 325 mg by mouth daily.   Yes Historical Provider, MD  carvedilol (COREG) 6.25 MG tablet Take 6.25 mg by mouth 2 (two) times daily with a meal.   Yes Historical Provider, MD  fluticasone (FLONASE) 50 MCG/ACT nasal spray Place 1 spray into both nostrils daily.   Yes Historical Provider, MD  levETIRAcetam (KEPPRA) 100 MG/ML solution Take 750 mg by mouth 2 (two) times daily.   Yes Historical Provider, MD  loratadine (CLARITIN) 10 MG tablet Take 10 mg by mouth daily.   Yes Historical Provider, MD  LORazepam (ATIVAN) 0.5 MG tablet Take 0.25 mg by mouth 2 (two) times daily.   Yes Historical  Provider, MD  LORazepam (ATIVAN) 1 MG tablet Take 0.5 mg by mouth 2 (two) times daily as needed for anxiety (and agitation).   Yes Historical Provider,  MD  risperiDONE (RISPERDAL) 1 MG tablet Take 1 mg by mouth 2 (two) times daily.   Yes Historical Provider, MD  sertraline (ZOLOFT) 100 MG tablet Take 150 mg by mouth every morning.   Yes Historical Provider, MD   No Known Allergies  FAMILY HISTORY:  No family history on file. SOCIAL HISTORY:  has no tobacco, alcohol, and drug history on file.  REVIEW OF SYSTEMS:   Cannot provide a review of systems due to altered mental status.   VITAL SIGNS: Temp:  [93.1 F (33.9 C)-98.1 F (36.7 C)] 98.1 F (36.7 C) (03/24 1315) Pulse Rate:  [70-98] 98 (03/24 1315) Resp:  [16-28] 28 (03/24 1315) BP: (71-120)/(49-75) 102/59 mmHg (03/24 1315) SpO2:  [78 %-100 %] 96 % (03/24 1315) FiO2 (%):  [55 %-100 %] 55 % (03/24 1217) Weight:  [128 lb 11.2 oz (58.378 kg)] 128 lb 11.2 oz (58.378 kg) (03/24 0913) HEMODYNAMICS:   VENTILATOR SETTINGS: Vent Mode:  [-]  FiO2 (%):  [55 %-100 %] 55 % INTAKE / OUTPUT:  Intake/Output Summary (Last 24 hours) at 02/01/16 1344 Last data filed at 02/01/16 1059  Gross per 24 hour  Intake   2000 ml  Output    100 ml  Net   1900 ml    Physical Examination:   VS: BP 102/59 mmHg  Pulse 98  Temp(Src) 98.1 F (36.7 C)  Resp 28  Ht 5\' 7"  (1.702 m)  Wt 128 lb 11.2 oz (58.378 kg)  BMI 20.15 kg/m2  SpO2 96%  General Appearance: No distress  Neuro:without focal findings, mental status reduced.  HEENT: PERRLA, EOM intact, no ptosis, no other lesions noticed;  Pulmonary: normal breath sounds but decreased in right base.   CardiovascularNormal S1,S2.  No m/r/g.    Abdomen: Benign, Soft, non-tender, No masses, hepatosplenomegaly, No lymphadenopathy Renal:  No costovertebral tenderness  GU:  Not performed at this time. Endoc: No evident thyromegaly, no signs of acromegaly. Skin:   warm, no rashes, no ecchymosis  Extremities: normal, no cyanosis, clubbing.   LABS: Reviewed   LABORATORY PANEL:   CBC  Recent Labs Lab 02/01/16 0936  WBC 5.4  HGB 11.5*    HCT 35.2*  PLT 141*    Chemistries   Recent Labs Lab 02/01/16 0936 02/01/16 1124  NA 172* 169*  K 3.7 3.3*  CL >130* >130*  CO2 24 23  GLUCOSE 144* 177*  BUN 79* 80*  CREATININE 2.17* 2.19*  CALCIUM 8.7* 8.2*  AST 56*  --   ALT 47  --   ALKPHOS 79  --   BILITOT 1.1  --     No results for input(s): GLUCAP in the last 168 hours. No results for input(s): PHART, PCO2ART, PO2ART in the last 168 hours.  Recent Labs Lab 02/01/16 0936  AST 56*  ALT 47  ALKPHOS 79  BILITOT 1.1  ALBUMIN 3.5    Cardiac Enzymes  Recent Labs Lab 02/01/16 0936  TROPONINI <0.03    RADIOLOGY:  Ct Head Wo Contrast  02/01/2016  CLINICAL DATA:  Unresponsive EXAM: CT HEAD WITHOUT CONTRAST TECHNIQUE: Contiguous axial images were obtained from the base of the skull through the vertex without intravenous contrast. COMPARISON:  None. FINDINGS: Bony calvarium is intact. 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 Scattered  chronic white matter ischemic changes noted. No findings to suggest acute hemorrhage, acute infarction or space-occupying mass lesion are noted. Atrophic changes are again seen. IMPRESSION: Chronic changes without acute abnormality. Electronically Signed   By: Inez Catalina M.D.   On: 02/01/2016 11:08   Dg Chest Portable 1 View  02/01/2016  CLINICAL DATA:  Altered mental status EXAM: PORTABLE CHEST 1 VIEW COMPARISON:  None. FINDINGS: Cardiac enlargement. Single lead cardiac pacer with generator over the left thorax. Retrocardiac left lower lobe not evaluated on single AP film but there appears to be abnormal opacity in the area. On the right, there is a rounded area of opacity most consistent with infiltrate at the right lung base measuring about 6.5 cm in diameter. IMPRESSION: Right lower lobe infiltrate concerning for pneumonia. Followup PA and lateral chest X-ray is recommended in 3-4 weeks following trial of antibiotic therapy to  ensure resolution and exclude underlying malignancy. Retrocardiac left lower lobe not well evaluated. Airspace disease in this area is suspected as well. Electronically Signed   By: Skipper Cliche M.D.   On: 02/01/2016 10:17       --Marda Stalker, MD.  Board Certified in Internal Medicine, Pulmonary Medicine, [place], and Sleep Medicine.  [facility]  Patricia Pesa, M.D.  Vilinda Boehringer, M.D.  Merton Border, M.D   02/01/2016, 1:44 PM  Critical Care Attestation.  I have personally obtained a history, examined the patient, evaluated laboratory and imaging results, formulated the assessment and plan and placed orders. The Patient requires high complexity decision making for assessment and support, frequent evaluation and titration of therapies, application of advanced monitoring technologies and extensive interpretation of multiple databases. The patient has critical illness that could lead imminently to failure of 1 or more organ systems and requires the highest level of physician preparedness to intervene.  Critical Care Time devoted to patient care services described in this note is 35 minutes and is exclusive of time spent in procedures.

## 2016-02-01 NOTE — Progress Notes (Signed)
Pt seen and examined after arrival to the floor. Labs updated.   VS: reviewed, physical exam unchanged from previous. Other than mental status which appears obtunded. Patient is minimally responsive.  General Appearance: No distress  Neuro:without focal findings, mental status reduced.  HEENT: PERRLA, EOM intact, no ptosis, no other lesions noticed;  Pulmonary: normal breath sounds but decreased in right base.    DW ICU RN, and pts wife via telephone. She notes that her husband would not want anything heroic such as life support including intubation/CPR. Pressors are reasonable, she feels that her husband to see him on life support or very sick.   Pt noted to have mild hypotension, will change IVF to half NS at 250/hr times 1 liter, will await further recs from nephro.   Code status changed to DNR.   Marda Stalker, M.D.  02/01/2016   Critical Care Attestation.  I have personally obtained a history, examined the patient, evaluated laboratory and imaging results, formulated the assessment and plan and placed orders. The Patient requires high complexity decision making for assessment and support, frequent evaluation and titration of therapies, application of advanced monitoring technologies and extensive interpretation of multiple databases. The patient has critical illness that could lead imminently to failure of 1 or more organ systems and requires the highest level of physician preparedness to intervene.  Critical Care Time devoted to patient care services described in this note is 35 minutes and is exclusive of time spent in procedures.

## 2016-02-01 NOTE — ED Notes (Signed)
[place] of patient's critical BP and Lab values. No new orders received.

## 2016-02-01 NOTE — Progress Notes (Addendum)
Received pt from ED as new admit, pt in great distress, labored breathimng, O2 sats in the 80s, pt placed on 100%. Family contacted regarding intubation and aggressive measures, code status changed to DNR. Fluids started and ABX given as well. Consults called. Nephrology at bedside with pt, new orders obtained.

## 2016-02-01 NOTE — H&P (Addendum)
[facility] [place] NAME: [name]    MR#:  [id number]  DATE OF BIRTH:  1956/11/02  DATE OF ADMISSION:  02/01/2016  PRIMARY CARE PHYSICIAN: Vista Mink, FNP   REQUESTING/REFERRING PHYSICIAN:   CHIEF COMPLAINT:   Chief Complaint  Patient presents with  . Altered Mental Status    HISTORY OF PRESENT ILLNESS: [name]  is a 60 y.o. male with unknown past medical history who was brought to the hospital because of poor responsiveness. Apparently patient is a resident of group home, usually is independent in his activities, however, since yesterday, was noted to be laying around and is eating somnolent and today he became poorly responsive, he is he was brought to emergency room for further evaluation and treatment, chest x-ray revealed right lower lobe pneumonia, he was hypotensive, systolic blood pressure in 70s on arrival to the hospital, also hypothermic, he was given 2 L of IV fluids, however, blood pressure still remains low. Labs revealed a hypernatremia, acute renal failure, mild anemia, thrombocytopenia, lactic acidosis. Unable to review systems, however, patient is able to cooperate briefly, answers yes and no to some questions, able to squeeze fingers and move his toes,   PAST MEDICAL HISTORY:  No past medical history on file.  PAST SURGICAL HISTORY: No past surgical history on file.  SOCIAL HISTORY:  Social History  Substance Use Topics  . Smoking status: Not on file  . Smokeless tobacco: Not on file  . Alcohol Use: Not on file    FAMILY HISTORY: No family history on file.  DRUG ALLERGIES: No Known Allergies  Review of Systems  Unable to perform ROS: critical illness    MEDICATIONS AT HOME:  Prior to Admission medications   Medication Sig Start Date End Date Taking? Authorizing Provider  aspirin 325 MG tablet Take 325 mg by mouth daily.   Yes Historical Provider, MD  carvedilol (COREG) 6.25 MG  tablet Take 6.25 mg by mouth 2 (two) times daily with a meal.   Yes Historical Provider, MD  fluticasone (FLONASE) 50 MCG/ACT nasal spray Place 1 spray into both nostrils daily.   Yes Historical Provider, MD  levETIRAcetam (KEPPRA) 100 MG/ML solution Take 750 mg by mouth 2 (two) times daily.   Yes Historical Provider, MD  loratadine (CLARITIN) 10 MG tablet Take 10 mg by mouth daily.   Yes Historical Provider, MD  LORazepam (ATIVAN) 0.5 MG tablet Take 0.25 mg by mouth 2 (two) times daily.   Yes Historical Provider, MD  LORazepam (ATIVAN) 1 MG tablet Take 0.5 mg by mouth 2 (two) times daily as needed for anxiety (and agitation).   Yes Historical Provider, MD  risperiDONE (RISPERDAL) 1 MG tablet Take 1 mg by mouth 2 (two) times daily.   Yes Historical Provider, MD  sertraline (ZOLOFT) 100 MG tablet Take 150 mg by mouth every morning.   Yes Historical Provider, MD      PHYSICAL EXAMINATION:   VITAL SIGNS: Blood pressure 105/60, pulse 88, temperature 97.6 F (36.4 C), resp. rate 27, height 5\' 7"  (1.702 m), weight 58.378 kg (128 lb 11.2 oz), SpO2 96 %.  GENERAL:  60 y.o.-year-old patient lying in the be in moderate respiratory distress, tachypneic , on Ventimask at 55% .  EYES: Pupils equal, round, reactive to light and accommodation. No scleral icterus. Extraocular muscles intact.  HEENT: Head atraumatic, normocephalic. Oropharynx and nasopharynx clear.  Ventimask was on the face  NECK:  Supple, no jugular venous  distention. No thyroid enlargement, no tenderness.  LUNGS: Diminished breath  sounds bilaterally at bases ,  tight auscultation, but no wheezing,  few rales,rhonchi , no crepitations.  intermittently using accessory muscles of respiration.  CARDIOVASCULAR: S1, S 2, tachycardic . No murmurs, rubs, or gallops.  ABDOMEN: Soft, nontender, nondistended. Bowel sounds present. No organomegaly or mass.  EXTREMITIES: No pedal edema, cyanosis, or clubbing.  NEUROLOGIC: Cranial nerves II through XII  are grossly  intact. Muscle strength  not able to evaluate due to poor cooperation, weakness, poor responsiveness . Sensation ,  Gait not checked due to critical condition    PSYCHIATRIC: The patient somnolent, able to open his eyes briefly, answer , some questions yes or no,  is able to assess orientation , able to squeeze with his hands, wiggle his toes.  SKIN: No obvious rash, lesion, or ulcer.   LABORATORY PANEL:   CBC  Recent Labs Lab 02/01/16 0936  WBC 5.4  HGB 11.5*  HCT 35.2*  PLT 141*  MCV 94.8  MCH 31.0  MCHC 32.7  RDW 16.2*  LYMPHSABS 0.5*  MONOABS 0.4  EOSABS 0.0  BASOSABS 0.0   ------------------------------------------------------------------------------------------------------------------  Chemistries   Recent Labs Lab 02/01/16 0936 02/01/16 1124  NA 172* 169*  K 3.7 3.3*  CL >130* >130*  CO2 24 23  GLUCOSE 144* 177*  BUN 79* 80*  CREATININE 2.17* 2.19*  CALCIUM 8.7* 8.2*  AST 56*  --   ALT 47  --   ALKPHOS 79  --   BILITOT 1.1  --    ------------------------------------------------------------------------------------------------------------------  Cardiac Enzymes  Recent Labs Lab 02/01/16 0936  TROPONINI <0.03   ------------------------------------------------------------------------------------------------------------------  RADIOLOGY: Ct Head Wo Contrast  02/01/2016  CLINICAL DATA:  Unresponsive EXAM: CT HEAD WITHOUT CONTRAST TECHNIQUE: Contiguous axial images were obtained from the base of the skull through the vertex without intravenous contrast. COMPARISON:  None. FINDINGS: Bony calvarium is intact. 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 Scattered chronic white matter ischemic changes noted. No findings to suggest acute hemorrhage, acute infarction or space-occupying mass lesion are noted. Atrophic changes are again seen. IMPRESSION: Chronic changes without acute abnormality.  Electronically Signed   By: Inez Catalina M.D.   On: 02/01/2016 11:08   Dg Chest Portable 1 View  02/01/2016  CLINICAL DATA:  Altered mental status EXAM: PORTABLE CHEST 1 VIEW COMPARISON:  None. FINDINGS: Cardiac enlargement. Single lead cardiac pacer with generator over the left thorax. Retrocardiac left lower lobe not evaluated on single AP film but there appears to be abnormal opacity in the area. On the right, there is a rounded area of opacity most consistent with infiltrate at the right lung base measuring about 6.5 cm in diameter. IMPRESSION: Right lower lobe infiltrate concerning for pneumonia. Followup PA and lateral chest X-ray is recommended in 3-4 weeks following trial of antibiotic therapy to ensure resolution and exclude underlying malignancy. Retrocardiac left lower lobe not well evaluated. Airspace disease in this area is suspected as well. Electronically Signed   By: Skipper Cliche M.D.   On: 02/01/2016 10:17    EKG: Orders placed or performed during the hospital encounter of 02/01/16  . EKG 12-Lead  . EKG 12-Lead  . EKG 12-Lead  . EKG 12-Lead  . EKG 12-Lead  . EKG 12-Lead  . EKG 12-Lead  . EKG 12-Lead  . ED EKG  . ED EKG  . ED EKG  . ED EKG   EKG revealed  normal sinus rhythm at 82 bpm, left axis deviation, prolonged PR interval LVH with repolarization abnormality. Prolonged QTc interval to 539 ms  IMPRESSION AND PLAN:  Principal Problem:   Septic shock (HCC) Active Problems:   Right lower lobe pneumonia   Hypernatremia   Acute renal failure (ARF) (HCC)   Lactic acidosis   Metabolic encephalopathy   Thrombocytopenia (HCC)   Anemia   Sepsis ([place]) #1. Sepsis with septic shock, admit patient to intensive care unit, continue IV fluids at high rate, initiate patient on broad-spectrum antibiotic therapy, including vancomycin, Zosyn and Zithromax. Initiate patient on Levophed if map is below 65 #2. Right lower lobe pneumonia, healthcare associated, continue patient on  broad-spectrum antibiotic therapy, get sputum cultures if possible, adjust antibiotics depending on culture results #3. Hypernatremia, continue patient on the high rate IV fluids with normal saline for now until a volume is repleted, then change to D5 water solution when blood pressure is stable, check sodium level every 2 hours. #4. Metabolic encephalopathy, neuro checks frequently, supportive care. Getting urinary drug screen #5. Acute renal failure, Foley catheter is in, follow ins and outs, kidney function closely, get ultrasound   #6. Thrombocytopenia, likely DIC, get pro time INR, follow platelet count tomorrow morning   All the records are reviewed and case discussed with ED provider. Management plans discussed with the patient, family and they are in agreement.  CODE STATUS: Code Status History    This patient does not have a recorded code status. Please follow your organizational policy for patients in this situation.       TOTAL  critical care TIME TAKING CARE OF THIS PATIENT: 60 minutes.   discussed with Dr. Onnie Boer M.D on 02/01/2016 at 12:41 PM  Between 7am to 6pm - Pager - [phone] After 6pm go to [email or web address] - password [email or web address] [facility]  Office  [phone]  CC: Primary care physician; Vista Mink, FNP

## 2016-02-01 NOTE — ED Notes (Signed)
[facility] Arrives with patient: C/O "Found unresponsive yesterday" Prehospital treatment and glucose noted. No further report.

## 2016-02-02 DIAGNOSIS — J189 Pneumonia, unspecified organism: Secondary | ICD-10-CM

## 2016-02-02 LAB — CBC
HCT: 29.7 % — ABNORMAL LOW (ref 40.0–52.0)
Hemoglobin: 9.8 g/dL — ABNORMAL LOW (ref 13.0–18.0)
MCH: 30.8 pg (ref 26.0–34.0)
MCHC: 33 g/dL (ref 32.0–36.0)
MCV: 93.4 fL (ref 80.0–100.0)
PLATELETS: 112 10*3/uL — AB (ref 150–440)
RBC: 3.18 MIL/uL — ABNORMAL LOW (ref 4.40–5.90)
RDW: 16.2 % — AB (ref 11.5–14.5)
WBC: 6.9 10*3/uL (ref 3.8–10.6)

## 2016-02-02 LAB — BASIC METABOLIC PANEL
BUN: 89 mg/dL — AB (ref 6–20)
CO2: 19 mmol/L — AB (ref 22–32)
CREATININE: 2.99 mg/dL — AB (ref 0.61–1.24)
Calcium: 7.9 mg/dL — ABNORMAL LOW (ref 8.9–10.3)
GFR calc non Af Amer: 22 mL/min — ABNORMAL LOW (ref >60)
GFR, EST AFRICAN AMERICAN: 25 mL/min — AB (ref >60)
GLUCOSE: 230 mg/dL — AB (ref 65–99)
POTASSIUM: 3.3 mmol/L — AB (ref 3.5–5.1)
Sodium: 160 mmol/L — ABNORMAL HIGH (ref 135–145)

## 2016-02-02 LAB — URINALYSIS COMPLETE WITH MICROSCOPIC (ARMC ONLY)
BACTERIA UA: NONE SEEN
Bilirubin Urine: NEGATIVE
GLUCOSE, UA: NEGATIVE mg/dL
KETONES UR: NEGATIVE mg/dL
LEUKOCYTES UA: NEGATIVE
NITRITE: NEGATIVE
PROTEIN: NEGATIVE mg/dL
SPECIFIC GRAVITY, URINE: 1.011 (ref 1.005–1.030)
pH: 5 (ref 5.0–8.0)

## 2016-02-02 LAB — PHOSPHORUS: Phosphorus: 1.5 mg/dL — ABNORMAL LOW (ref 2.5–4.6)

## 2016-02-02 LAB — MAGNESIUM: Magnesium: 2.3 mg/dL (ref 1.7–2.4)

## 2016-02-02 LAB — STREP PNEUMONIAE URINARY ANTIGEN: Strep Pneumo Urinary Antigen: POSITIVE — AB

## 2016-02-02 LAB — OSMOLALITY: Osmolality: 375 mOsm/kg (ref 275–295)

## 2016-02-02 MED ORDER — POTASSIUM PHOSPHATES 15 MMOLE/5ML IV SOLN
20.0000 meq | Freq: Once | INTRAVENOUS | Status: AC
  Administered 2016-02-02: 20 meq via INTRAVENOUS
  Filled 2016-02-02: qty 4.55

## 2016-02-02 MED ORDER — POTASSIUM CHLORIDE 10 MEQ/100ML IV SOLN
10.0000 meq | INTRAVENOUS | Status: AC
  Administered 2016-02-02 (×4): 10 meq via INTRAVENOUS
  Filled 2016-02-02 (×4): qty 100

## 2016-02-02 MED ORDER — MORPHINE SULFATE (PF) 2 MG/ML IV SOLN
2.0000 mg | INTRAVENOUS | Status: DC | PRN
  Administered 2016-02-02: 2 mg via INTRAVENOUS
  Administered 2016-02-04 (×2): 1 mg via INTRAVENOUS
  Administered 2016-02-04 – 2016-02-05 (×2): 2 mg via INTRAVENOUS
  Filled 2016-02-02 (×4): qty 1

## 2016-02-02 MED ORDER — ACETAMINOPHEN 650 MG RE SUPP
650.0000 mg | Freq: Four times a day (QID) | RECTAL | Status: DC | PRN
  Administered 2016-02-02: 650 mg via RECTAL
  Filled 2016-02-02: qty 1

## 2016-02-02 MED ORDER — DEXTROSE 5 % IV SOLN
INTRAVENOUS | Status: DC
  Administered 2016-02-02 – 2016-02-03 (×6): via INTRAVENOUS

## 2016-02-02 NOTE — Progress Notes (Signed)
[facility] [place] NAME: [name]    MR#:  [id number]  Myrtle:  [birth year]  SUBJECTIVE:  CHIEF COMPLAINT:  Patient is lethargic and very sick looking  REVIEW OF SYSTEMS:  Review of systems unobtainable as patient is lethargic  DRUG ALLERGIES:  No Known Allergies  VITALS:  Blood pressure 78/51, pulse 88, temperature 100.8 F (38.2 C), temperature source Oral, resp. rate 29, height 5\' 7"  (1.702 m), weight 62.5 kg (137 lb 12.6 oz), SpO2 95 %.  PHYSICAL EXAMINATION:  GENERAL:  60 y.o.-year-old patient lying in the bed with no acute distress.  EYES: Pupils equal, round, reactive to light and accommodation. No scleral icterus. Extraocular muscles intact.  HEENT: Head atraumatic, normocephalic. Oropharynx and nasopharynx clear.  NECK:  Supple, no jugular venous distention. No thyroid enlargement, no tenderness.  LUNGS: Moderate air entry with decreased breath sounds at the lower lung fields, no wheezing, positive rales,rhonchi or crepitation. No use of accessory muscles of respiration.  CARDIOVASCULAR: S1, S2 normal. No murmurs, rubs, or gallops.  ABDOMEN: Soft, nontender, nondistended. Bowel sounds present. No organomegaly or mass.  EXTREMITIES: No pedal edema, cyanosis, or clubbing.  NEUROLOGIC: Altered Psych-altered mental status   CBC  Recent Labs Lab 02/02/16 0251  WBC 6.9  HGB 9.8*  HCT 29.7*  PLT 112*   ------------------------------------------------------------------------------------------------------------------  Chemistries   Recent Labs Lab 02/01/16 0936  02/02/16 0251  NA 172*  < > 160*  K 3.7  < > 3.3*  CL >130*  < > >130*  CO2 24  < > 19*  GLUCOSE 144*  < > 230*  BUN 79*  < > 89*  CREATININE 2.17*  < > 2.99*  CALCIUM 8.7*  < > 7.9*  MG  --   --  2.3  AST 56*  --   --   ALT 47  --   --   ALKPHOS 79  --   --   BILITOT 1.1  --   --   < > = values in this interval not  displayed. ------------------------------------------------------------------------------------------------------------------  Cardiac Enzymes  Recent Labs Lab 02/01/16 0936  TROPONINI <0.03   ------------------------------------------------------------------------------------------------------------------  RADIOLOGY:  Ct Head Wo Contrast  02/01/2016  CLINICAL DATA:  Unresponsive EXAM: CT HEAD WITHOUT CONTRAST TECHNIQUE: Contiguous axial images were obtained from the base of the skull through the vertex without intravenous contrast. COMPARISON:  None. FINDINGS: Bony calvarium is intact. 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 Scattered chronic white matter ischemic changes noted. No findings to suggest acute hemorrhage, acute infarction or space-occupying mass lesion are noted. Atrophic changes are again seen. IMPRESSION: Chronic changes without acute abnormality. Electronically Signed   By: Inez Catalina M.D.   On: 02/01/2016 11:08   US Renal  02/01/2016  CLINICAL DATA:  Acute renal failure. EXAM: RENAL / URINARY TRACT ULTRASOUND COMPLETE COMPARISON:  None. FINDINGS: Right Kidney: Length: 9.7 cm. Echogenicity within normal limits. No mass visualized. Slight fullness of the renal pelvis without talus seal dilatation. Left Kidney: Length: 9.9 cm. Echogenicity within normal limits. No solid mass or hydronephrosis visualized. 1.9 x 2.5 x 1.8 cm upper pole cyst. 8 mm interpolar cyst. Bladder: Decompressed by a Foley catheter. IMPRESSION: 1. Slight fullness of the right renal pelvis without frank hydronephrosis. 2. Left renal cysts. Electronically Signed   By: Logan Bores M.D.   On: 02/01/2016 14:01   Dg Chest Portable 1 View  02/01/2016  CLINICAL DATA:  Altered mental status EXAM: PORTABLE CHEST 1 VIEW COMPARISON:  None. FINDINGS: Cardiac enlargement. Single lead cardiac pacer with generator over the left thorax. Retrocardiac left lower lobe not  evaluated on single AP film but there appears to be abnormal opacity in the area. On the right, there is a rounded area of opacity most consistent with infiltrate at the right lung base measuring about 6.5 cm in diameter. IMPRESSION: Right lower lobe infiltrate concerning for pneumonia. Followup PA and lateral chest X-ray is recommended in 3-4 weeks following trial of antibiotic therapy to ensure resolution and exclude underlying malignancy. Retrocardiac left lower lobe not well evaluated. Airspace disease in this area is suspected as well. Electronically Signed   By: Skipper Cliche M.D.   On: 02/01/2016 10:17    EKG:   Orders placed or performed during the hospital encounter of 02/01/16  . EKG 12-Lead  . EKG 12-Lead  . EKG 12-Lead  . EKG 12-Lead  . EKG 12-Lead  . EKG 12-Lead  . EKG 12-Lead  . EKG 12-Lead  . ED EKG  . ED EKG  . ED EKG  . ED EKG    ASSESSMENT AND PLAN:   #1. Sepsis with septic shock,  secondary to pneumonia  continue IV fluids , Levophed as needed for map 65 and greater    patient on broad-spectrum antibiotic therapy, including vancomycin, Zosyn and Zithromax.  Appreciate pulmonology recommendations   #2. Right lower lobe pneumonia, healthcare associated,  continue patient on broad-spectrum antibiotic therapy, get sputum cultures if possible, adjust antibiotics depending on culture results  #3. Hypernatremia,  sodium trended down from 165-160  continue patient on  D5W and monitor , check sodium level Appreciate nephrology recommendations.  #4. Metabolic encephalopathy, secondary to sepsis and acute renal failure  neuro checks frequently, supportive care. Getting urinary drug screen  #5. Acute renal failure, separate acute TUBular necrosis   Foley catheter is in, follow ins and outs, kidney function closely Renal ultrasound is normal    #6. thrombocytopenia, l no active bleeding, monitor platelet counts closely 144-112,      All the records are  reviewed and case discussed with Care Management/Social Workerr. Management plans discussed with the patient, family and they are in agreement.  CODE STATUS: DNR  TOTAL CRITICAL CARE TIME TAKING CARE OF THIS PATIENT: 35 minutes.   POSSIBLE D/C IN ? DAYS, DEPENDING ON CLINICAL CONDITION.   Nicholes Mango M.D on 02/02/2016 at 2:04 PM  Between 7am to 6pm - Pager - [phone] After 6pm go to [email or web address] - password [email or web address] [facility]  Office  [phone]  CC: Primary care physician; Vista Mink, FNP

## 2016-02-02 NOTE — Progress Notes (Signed)
Speech Therapy Note: received order, reviewed chart notes, consulted NSG re: pt's status this morning. Per chart labs and vitals, pt's temp has fluctuated and is now slightly elevated; Sodium has only reduced to 160 from 167. Pt is poorly responsive, lethargic.  ST will hold on BSE at this time until pt's medical status is more stable and pt is fully awake/alert to safely participate in a BSE. Rec. Frequent oral care for hygiene and stim. NSG agreed.

## 2016-02-02 NOTE — Progress Notes (Signed)
[facility]  ROUNDING NOTE   Subjective:  BUN is up to 89 and creatinine up to 2.99. However serum sodium improved down to 160. Urine output noted as being 650 cc over the preceding 24 hours.   Objective:  Vital signs in last 24 hours:  Temp:  [93.1 F (33.9 C)-100.2 F (37.9 C)] 100.2 F (37.9 C) (03/25 0815) Pulse Rate:  [55-120] 89 (03/25 0815) Resp:  [16-43] 37 (03/25 0815) BP: (68-120)/(39-75) 90/74 mmHg (03/25 0815) SpO2:  [78 %-100 %] 90 % (03/25 0815) FiO2 (%):  [55 %-100 %] 55 % (03/25 0815) Weight:  [58.378 kg (128 lb 11.2 oz)-62.5 kg (137 lb 12.6 oz)] 62.5 kg (137 lb 12.6 oz) (03/25 0458)  Weight change:  Filed Weights   02/01/16 0913 02/02/16 0458  Weight: 58.378 kg (128 lb 11.2 oz) 62.5 kg (137 lb 12.6 oz)    Intake/Output: I/O last 3 completed shifts: In: 2952.3 [I.V.:2294.8; IV Piggyback:657.5] Out: 650 [Urine:650]   Intake/Output this shift:     Physical Exam: General: Critically ill appearing  Head: Normocephalic, atraumatic. Moist oral mucosal membranes  Eyes: Anicteric  Neck: Supple, trachea midline  Lungs:  Bilateral rhonchi normal effort  Heart: S1S2 no rubs  Abdomen:  Soft, nontender, BS present   Extremities: no peripheral edema.  Neurologic: Arousable, not consistently following commands  Skin: No lesions       Basic Metabolic Panel:  Recent Labs Lab 02/01/16 0936 02/01/16 1124 02/01/16 1554 02/01/16 1814 02/02/16 0251  NA 172* 169* 167* 167* 160*  K 3.7 3.3* 3.3* 3.0* 3.3*  CL >130* >130* >130* >130* >130*  CO2 24 23 19* 21* 19*  GLUCOSE 144* 177* 147* 156* 230*  BUN 79* 80* 80* 81* 89*  CREATININE 2.17* 2.19* 2.30* 2.49* 2.99*  CALCIUM 8.7* 8.2* 8.3* 8.1* 7.9*  MG  --   --   --   --  2.3  PHOS  --   --   --   --  1.5*    Liver Function Tests:  Recent Labs Lab 02/01/16 0936  AST 56*  ALT 47  ALKPHOS 79  BILITOT 1.1  PROT 7.1  ALBUMIN 3.5   No results for input(s): LIPASE, AMYLASE in the last 168  hours. No results for input(s): AMMONIA in the last 168 hours.  CBC:  Recent Labs Lab 02/01/16 0936 02/02/16 0251  WBC 5.4 6.9  NEUTROABS 4.5  --   HGB 11.5* 9.8*  HCT 35.2* 29.7*  MCV 94.8 93.4  PLT 141* 112*    Cardiac Enzymes:  Recent Labs Lab 02/01/16 0936  TROPONINI <0.03    BNP: Invalid input(s): POCBNP  CBG:  Recent Labs Lab 02/01/16 1552  GLUCAP 125*    Microbiology: Results for orders placed or performed during the hospital encounter of 02/01/16  Urine culture     Status: None (Preliminary result)   Collection Time: 02/01/16  9:36 AM  Result Value Ref Range Status   Specimen Description URINE, RANDOM  Final   Special Requests NONE  Final   Culture NO GROWTH < 24 HOURS  Final   Report Status PENDING  Incomplete  MRSA PCR Screening     Status: None   Collection Time: 02/01/16  5:00 PM  Result Value Ref Range Status   MRSA by PCR NEGATIVE NEGATIVE Final    Comment:        The GeneXpert MRSA Assay (FDA approved for NASAL specimens only), is one component of a comprehensive MRSA colonization surveillance  program. It is not intended to diagnose MRSA infection nor to guide or monitor treatment for MRSA infections.     Coagulation Studies: No results for input(s): LABPROT, INR in the last 72 hours.  Urinalysis:  Recent Labs  02/01/16 0936  COLORURINE YELLOW*  LABSPEC 1.014  PHURINE 5.0  GLUCOSEU NEGATIVE  HGBUR NEGATIVE  BILIRUBINUR NEGATIVE  KETONESUR NEGATIVE  PROTEINUR NEGATIVE  NITRITE NEGATIVE  LEUKOCYTESUR NEGATIVE      Imaging: Ct Head Wo Contrast  02/01/2016  CLINICAL DATA:  Unresponsive EXAM: CT HEAD WITHOUT CONTRAST TECHNIQUE: Contiguous axial images were obtained from the base of the skull through the vertex without intravenous contrast. COMPARISON:  None. FINDINGS: Bony calvarium is intact. 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 Scattered chronic white matter  ischemic changes noted. No findings to suggest acute hemorrhage, acute infarction or space-occupying mass lesion are noted. Atrophic changes are again seen. IMPRESSION: Chronic changes without acute abnormality. Electronically Signed   By: Inez Catalina M.D.   On: 02/01/2016 11:08   US Renal  02/01/2016  CLINICAL DATA:  Acute renal failure. EXAM: RENAL / URINARY TRACT ULTRASOUND COMPLETE COMPARISON:  None. FINDINGS: Right Kidney: Length: 9.7 cm. Echogenicity within normal limits. No mass visualized. Slight fullness of the renal pelvis without talus seal dilatation. Left Kidney: Length: 9.9 cm. Echogenicity within normal limits. No solid mass or hydronephrosis visualized. 1.9 x 2.5 x 1.8 cm upper pole cyst. 8 mm interpolar cyst. Bladder: Decompressed by a Foley catheter. IMPRESSION: 1. Slight fullness of the right renal pelvis without frank hydronephrosis. 2. Left renal cysts. Electronically Signed   By: Logan Bores M.D.   On: 02/01/2016 14:01   Dg Chest Portable 1 View  02/01/2016  CLINICAL DATA:  Altered mental status EXAM: PORTABLE CHEST 1 VIEW COMPARISON:  None. FINDINGS: Cardiac enlargement. Single lead cardiac pacer with generator over the left thorax. Retrocardiac left lower lobe not evaluated on single AP film but there appears to be abnormal opacity in the area. On the right, there is a rounded area of opacity most consistent with infiltrate at the right lung base measuring about 6.5 cm in diameter. IMPRESSION: Right lower lobe infiltrate concerning for pneumonia. Followup PA and lateral chest X-ray is recommended in 3-4 weeks following trial of antibiotic therapy to ensure resolution and exclude underlying malignancy. Retrocardiac left lower lobe not well evaluated. Airspace disease in this area is suspected as well. Electronically Signed   By: Skipper Cliche M.D.   On: 02/01/2016 10:17     Medications:   . sodium chloride 250 mL/hr at 02/01/16 1723  . dextrose 150 mL/hr at 02/02/16 0553  .  norepinephrine 2 mcg/min (02/02/16 0155)   . albuterol  2.5 mg Nebulization Q4H  . aspirin  300 mg Rectal Daily  . azithromycin  500 mg Intravenous Q24H  . heparin  5,000 Units Subcutaneous 3 times per day  . ipratropium  0.5 mg Nebulization Q4H  . levETIRAcetam  750 mg Intravenous Q12H  . LORazepam  0.25 mg Intravenous Q12H  . pantoprazole (PROTONIX) IV  40 mg Intravenous Q24H  . piperacillin-tazobactam (ZOSYN)  IV  3.375 g Intravenous 3 times per day  . potassium phosphate IVPB (mEq)  20 mEq Intravenous Once  . risperiDONE  1 mg Oral BID  . sodium chloride flush  3 mL Intravenous Q12H  . vancomycin  750 mg Intravenous Q24H   LORazepam, morphine injection, ondansetron **OR** ondansetron (ZOFRAN) IV  Assessment/  Plan:  60 y.o. male with no significant past medical history , who was admitted to [facility] on 02/01/2016 for evaluation of altered mental status. He was found have severe metabolic derangements upon admission. He was found to have a right middle lobe pneumonia. His serum sodium was very high at 167 with chloride greater than 130. BUN was high at 80 with a creatinine of 2.1.  1. Acute renal failure secondary to acute tubular necrosis, in the setting of pneumonia. - Unknown baseline renal function. Presenting creatinine was 2.1. - UOP was 650cc, Cr higher today at 2.99.  No urgent indication for HD at the moment, will continue to monitor progress closely. Will consider dialysis tomorrow if renal function worse and UOP drops.  2. Severe hypernatremia. Na down to 160, continue D5W at 150cc/hr.   3. Right lower lobe bacterial pneumonia. He is on a multitude of antibiotics including Zosyn, vancomycin, ceftriaxone, and azithromycin. Consider nearing antibody regimen.  4. Hypokalemia.  Give another KCL 76meq IV x one.   LOS: 1 [name] 3/25/20178:26 AM

## 2016-02-02 NOTE — Progress Notes (Addendum)
Pharmacy Antibiotic Note  [name] is a 60 y.o. male admitted on 02/01/2016 with sepsis.  Pharmacy has been consulted for vancomycin and Zosyn dosing.  Plan: Will continue vancomycin 750mg  IV Q24hr for goal trough of 15-20. Will obtain trough prior to dose on 3/28. Will discuss d/c vancomycin with provider due to negative MRSA PCR.   Will continue Zosyn EI 3.375g IV Q8hr.    Willl continue azithromycin 500 mg iv q 24 hours.   Height: 5\' 7"  (170.2 cm) Weight: 137 lb 12.6 oz (62.5 kg) IBW/kg (Calculated) : 66.1  Temp (24hrs), Avg:98.5 F (36.9 C), Min:97.5 F (36.4 C), Max:100.2 F (37.9 C)   Recent Labs Lab 02/01/16 0936 02/01/16 1124 02/01/16 1239 02/01/16 1554 02/01/16 1814 02/02/16 0251  WBC 5.4  --   --   --   --  6.9  CREATININE 2.17* 2.19*  --  2.30* 2.49* 2.99*  LATICACIDVEN 2.5*  --  1.5  --   --   --     Estimated Creatinine Clearance: 23.8 mL/min (by C-G formula based on Cr of 2.99).    No Known Allergies  Antimicrobials this admission: Vancomycin 3/24 >>  Zosyn 3/24 >>  Azithromycin 3/24 >>  Ceftriaxone 3/24>>3/24   Microbiology results: 3/24 BCx x 2: NGTD 3/24 UCx: NGTD 3/24 Sputum: pending 3/24 MRSA PCR: negative  3/24 Influenza: pending  Pharmacy will continue to monitor and adjust per consult.   [name] D 02/02/2016 11:32 AM

## 2016-02-02 NOTE — Consult Note (Signed)
[facility] Consultation     ASSESSMENT/PLAN   The patient is a 60 year old group home resident presents with pneumonia with septic shock. Severe hypernatremia with dehydration and AKI. Remains on vasopressors, looks very ill  PULMONARY A:Acute respiratory failure secondary to pneumonia with sepsis. P:   -broad-spectrum antibiotics for likely healthcare associated pneumonia. - Sputum cultures if possible. -Wean down oxygen as tolerated, will monitor patient's respiration status closely. Patient is DNR now -step down status  CARDIOVASCULAR -Septic shock due to pneumonia, with hypotension P:  -Patient has received 3 L of IV fluids, appears fluid responsive, continue rehydration. -Pressors if needed to keep MAP>55  RENAL A:  Acute kidney injury. -Severe hypernatremia, likely secondary to dehydration. P:   -Continue IV rehydration with saline. -Nephrology consulted.  INFECTIOUS A:  Pneumonia with sepsis. P:   Continue antibiotics. -Monitor lactic acid. -Await cultures  BCx2 3/242: Pending UC 3/24: Pending Sputum-- Vancomycin 3/24>> Zosyn 3/20.>> MRSA and INF tests NEGATIVE    NEUROLOGIC A: Acute metabolic encephalopathy, likely due to septic shock as well as acute hypernatremia. -Continue treatment of underlying metabolic abnormalities.     Name: [name] MRN: [id number] DOB: 1956-05-05    ADMISSION DATE:  02/01/2016 CONSULTATION DATE:  02/01/16  REFERRING MD : Dr. Ether Griffins  CHIEF COMPLAINT:  Dyspnea.    HISTORY OF PRESENT ILLNESS:   Remains obtunded, on venti mask, looks very ill  No Known Allergies  REVIEW OF SYSTEMS:   Cannot provide a review of systems due to altered mental status.   VITAL SIGNS: Temp:  [93.1 F (33.9 C)-100.2 F (37.9 C)] 100.2 F (37.9 C) (03/25 0815) Pulse Rate:  [55-120] 89 (03/25 0815) Resp:  [16-43] 37 (03/25 0815) BP: (68-120)/(39-75) 90/74 mmHg (03/25 0815) SpO2:  [78 %-100 %] 90 %  (03/25 0815) FiO2 (%):  [55 %-100 %] 55 % (03/25 0815) Weight:  [128 lb 11.2 oz (58.378 kg)-137 lb 12.6 oz (62.5 kg)] 137 lb 12.6 oz (62.5 kg) (03/25 0458) HEMODYNAMICS:   VENTILATOR SETTINGS: Vent Mode:  [-]  FiO2 (%):  [55 %-100 %] 55 % INTAKE / OUTPUT:  Intake/Output Summary (Last 24 hours) at 02/02/16 0848 Last data filed at 02/02/16 0423  Gross per 24 hour  Intake 2952.3 ml  Output    650 ml  Net 2302.3 ml    Physical Examination:   VS: BP 90/74 mmHg  Pulse 89  Temp(Src) 100.2 F (37.9 C) (Oral)  Resp 37  Ht 5\' 7"  (1.702 m)  Wt 137 lb 12.6 oz (62.5 kg)  BMI 21.58 kg/m2  SpO2 90%  General Appearance:+distress  Neuro:without focal findings, mental status reduced. GCs8  HEENT: PERRLA, EOM intact, no ptosis, no other lesions noticed;  Pulmonary: normal breath sounds but decreased in right base. slight  Increased WOB CardiovascularNormal S1,S2.  No m/r/g.    Abdomen: Benign, Soft, non-tender, No masses, hepatosplenomegaly, No lymphadenopathy Renal:  No costovertebral tenderness  GU:  Not performed at this time. Endoc: No evident thyromegaly, no signs of acromegaly. Skin:   warm, no rashes, no ecchymosis  Extremities: normal, no cyanosis, clubbing.   LABS: Reviewed   LABORATORY PANEL:   CBC  Recent Labs Lab 02/02/16 0251  WBC 6.9  HGB 9.8*  HCT 29.7*  PLT 112*    Chemistries   Recent Labs Lab 02/01/16 0936  02/02/16 0251  NA 172*  < > 160*  K 3.7  < > 3.3*  CL >130*  < > >130*  CO2 24  < >  19*  GLUCOSE 144*  < > 230*  BUN 79*  < > 89*  CREATININE 2.17*  < > 2.99*  CALCIUM 8.7*  < > 7.9*  MG  --   --  2.3  PHOS  --   --  1.5*  AST 56*  --   --   ALT 47  --   --   ALKPHOS 79  --   --   BILITOT 1.1  --   --   < > = values in this interval not displayed.   Recent Labs Lab 02/01/16 1552  GLUCAP 125*    Recent Labs Lab 02/01/16 1608  PHART 7.40  PCO2ART 34  PO2ART 62*    Recent Labs Lab 02/01/16 0936  AST 56*  ALT 47    ALKPHOS 79  BILITOT 1.1  ALBUMIN 3.5    Cardiac Enzymes  Recent Labs Lab 02/01/16 0936  TROPONINI <0.03    RADIOLOGY:  Ct Head Wo Contrast  02/01/2016  CLINICAL DATA:  Unresponsive EXAM: CT HEAD WITHOUT CONTRAST TECHNIQUE: Contiguous axial images were obtained from the base of the skull through the vertex without intravenous contrast. COMPARISON:  None. FINDINGS: Bony calvarium is intact. 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 Scattered chronic white matter ischemic changes noted. No findings to suggest acute hemorrhage, acute infarction or space-occupying mass lesion are noted. Atrophic changes are again seen. IMPRESSION: Chronic changes without acute abnormality. Electronically Signed   By: Inez Catalina M.D.   On: 02/01/2016 11:08   US Renal  02/01/2016  CLINICAL DATA:  Acute renal failure. EXAM: RENAL / URINARY TRACT ULTRASOUND COMPLETE COMPARISON:  None. FINDINGS: Right Kidney: Length: 9.7 cm. Echogenicity within normal limits. No mass visualized. Slight fullness of the renal pelvis without talus seal dilatation. Left Kidney: Length: 9.9 cm. Echogenicity within normal limits. No solid mass or hydronephrosis visualized. 1.9 x 2.5 x 1.8 cm upper pole cyst. 8 mm interpolar cyst. Bladder: Decompressed by a Foley catheter. IMPRESSION: 1. Slight fullness of the right renal pelvis without frank hydronephrosis. 2. Left renal cysts. Electronically Signed   By: Logan Bores M.D.   On: 02/01/2016 14:01   Dg Chest Portable 1 View  02/01/2016  CLINICAL DATA:  Altered mental status EXAM: PORTABLE CHEST 1 VIEW COMPARISON:  None. FINDINGS: Cardiac enlargement. Single lead cardiac pacer with generator over the left thorax. Retrocardiac left lower lobe not evaluated on single AP film but there appears to be abnormal opacity in the area. On the right, there is a rounded area of opacity most consistent with infiltrate at the right lung base measuring  about 6.5 cm in diameter. IMPRESSION: Right lower lobe infiltrate concerning for pneumonia. Followup PA and lateral chest X-ray is recommended in 3-4 weeks following trial of antibiotic therapy to ensure resolution and exclude underlying malignancy. Retrocardiac left lower lobe not well evaluated. Airspace disease in this area is suspected as well. Electronically Signed   By: Skipper Cliche M.D.   On: 02/01/2016 10:17     The Patient requires high complexity decision making for assessment and support, frequent evaluation and titration of therapies, application of advanced monitoring technologies and extensive interpretation of multiple databases. Critical Care Time devoted to patient care services described in this note is 35 minutes.   Overall, patient is critically ill, prognosis is guarded.  Patient with Multiorgan failure and at high risk for cardiac arrest and death.    Corrin Parker, M.D.  [facility]  Medical Director [place] Director [facility] Cardio-Pulmonary Department

## 2016-02-03 ENCOUNTER — Inpatient Hospital Stay: Payer: Medicare Other

## 2016-02-03 LAB — LACTIC ACID, PLASMA: LACTIC ACID, VENOUS: 1.3 mmol/L (ref 0.5–2.0)

## 2016-02-03 LAB — URINE CULTURE: CULTURE: NO GROWTH

## 2016-02-03 LAB — CBC WITH DIFFERENTIAL/PLATELET
Basophils Absolute: 0 10*3/uL (ref 0–0.1)
Basophils Relative: 0 %
EOS ABS: 0 10*3/uL (ref 0–0.7)
HCT: 27.4 % — ABNORMAL LOW (ref 40.0–52.0)
HEMOGLOBIN: 9.1 g/dL — AB (ref 13.0–18.0)
LYMPHS ABS: 1.1 10*3/uL (ref 1.0–3.6)
Lymphocytes Relative: 11 %
MCH: 31.5 pg (ref 26.0–34.0)
MCHC: 33.2 g/dL (ref 32.0–36.0)
MCV: 95 fL (ref 80.0–100.0)
Monocytes Absolute: 0.4 10*3/uL (ref 0.2–1.0)
Neutro Abs: 8.2 10*3/uL — ABNORMAL HIGH (ref 1.4–6.5)
Platelets: 87 10*3/uL — ABNORMAL LOW (ref 150–440)
RBC: 2.88 MIL/uL — ABNORMAL LOW (ref 4.40–5.90)
RDW: 16.3 % — ABNORMAL HIGH (ref 11.5–14.5)
WBC: 9.7 10*3/uL (ref 3.8–10.6)

## 2016-02-03 LAB — BASIC METABOLIC PANEL
ANION GAP: 3 — AB (ref 5–15)
BUN: 70 mg/dL — AB (ref 6–20)
BUN: 54 mg/dL — ABNORMAL HIGH (ref 6–20)
CO2: 19 mmol/L — AB (ref 22–32)
CO2: 18 mmol/L — ABNORMAL LOW (ref 22–32)
CREATININE: 2.5 mg/dL — AB (ref 0.61–1.24)
CREATININE: 2.03 mg/dL — AB (ref 0.61–1.24)
Calcium: 8 mg/dL — ABNORMAL LOW (ref 8.9–10.3)
Calcium: 8.1 mg/dL — ABNORMAL LOW (ref 8.9–10.3)
Chloride: 127 mmol/L — ABNORMAL HIGH (ref 101–111)
GFR calc Af Amer: 31 mL/min — ABNORMAL LOW (ref >60)
GFR calc non Af Amer: 27 mL/min — ABNORMAL LOW (ref >60)
GFR calc non Af Amer: 34 mL/min — ABNORMAL LOW (ref >60)
GFR, EST AFRICAN AMERICAN: 40 mL/min — AB (ref >60)
Glucose, Bld: 179 mg/dL — ABNORMAL HIGH (ref 65–99)
Glucose, Bld: 222 mg/dL — ABNORMAL HIGH (ref 65–99)
Potassium: 3.8 mmol/L (ref 3.5–5.1)
Potassium: 2.7 mmol/L — CL (ref 3.5–5.1)
SODIUM: 152 mmol/L — AB (ref 135–145)
SODIUM: 148 mmol/L — AB (ref 135–145)

## 2016-02-03 LAB — TROPONIN I: TROPONIN I: 0.1 ng/mL — AB (ref <0.031)

## 2016-02-03 LAB — GLUCOSE, CAPILLARY: Glucose-Capillary: 208 mg/dL — ABNORMAL HIGH (ref 65–99)

## 2016-02-03 LAB — PHOSPHORUS: PHOSPHORUS: 5.1 mg/dL — AB (ref 2.5–4.6)

## 2016-02-03 LAB — MAGNESIUM: MAGNESIUM: 2.4 mg/dL (ref 1.7–2.4)

## 2016-02-03 MED ORDER — IPRATROPIUM BROMIDE 0.02 % IN SOLN: 0.5000 mg | Freq: Four times a day (QID) | RESPIRATORY_TRACT | Status: DC

## 2016-02-03 MED ORDER — SODIUM CHLORIDE 0.9 % IV SOLN
1.0000 g | Freq: Once | INTRAVENOUS | Status: AC
  Administered 2016-02-03: 1 g via INTRAVENOUS
  Filled 2016-02-03: qty 10

## 2016-02-03 MED ORDER — IPRATROPIUM-ALBUTEROL 0.5-2.5 (3) MG/3ML IN SOLN
3.0000 mL | Freq: Four times a day (QID) | RESPIRATORY_TRACT | Status: DC
  Administered 2016-02-03 – 2016-02-10 (×26): 3 mL via RESPIRATORY_TRACT
  Filled 2016-02-03 (×26): qty 3

## 2016-02-03 MED ORDER — POTASSIUM CHLORIDE 10 MEQ/100ML IV SOLN
10.0000 meq | INTRAVENOUS | Status: AC
  Administered 2016-02-03 – 2016-02-04 (×4): 10 meq via INTRAVENOUS
  Filled 2016-02-03 (×4): qty 100

## 2016-02-03 MED ORDER — ALBUTEROL SULFATE (2.5 MG/3ML) 0.083% IN NEBU: 2.5000 mg | INHALATION_SOLUTION | Freq: Four times a day (QID) | RESPIRATORY_TRACT | Status: DC

## 2016-02-03 NOTE — Progress Notes (Signed)
Pharmacy Antibiotic Note  [name] is a 60 y.o. male admitted on 02/01/2016 with sepsis.  Pharmacy has been consulted for vancomycin and Zosyn dosing.  Plan: Will continue vancomycin 750mg  IV Q24hr for goal trough of 15-20. Will obtain trough prior to dose on 3/28. Spoke to Dr. Margaretmary Eddy regarding negative MRSA PCR. MD will consider d/c vancomycin.   Will continue Zosyn EI 3.375g IV Q8hr.    Willl continue azithromycin 500 mg iv q 24 hours.   Height: 5\' 7"  (170.2 cm) Weight: 137 lb 12.6 oz (62.5 kg) IBW/kg (Calculated) : 66.1  Temp (24hrs), Avg:100 F (37.8 C), Min:98.8 F (37.1 C), Max:101.3 F (38.5 C)   Recent Labs Lab 02/01/16 0936 02/01/16 1124 02/01/16 1239 02/01/16 1554 02/01/16 1814 02/02/16 0251 02/03/16 0610  WBC 5.4  --   --   --   --  6.9 9.7  CREATININE 2.17* 2.19*  --  2.30* 2.49* 2.99* 2.50*  LATICACIDVEN 2.5*  --  1.5  --   --   --   --     Estimated Creatinine Clearance: 28.5 mL/min (by C-G formula based on Cr of 2.5).    No Known Allergies  Antimicrobials this admission: Vancomycin 3/24 >>  Zosyn 3/24 >>  Azithromycin 3/24 >>  Ceftriaxone 3/24>>3/24   Microbiology results: 3/24 BCx x 2: NGTD 3/24 UCx: NGTD 3/24 Sputum: pending 3/24 MRSA PCR: negative  3/24 Influenza: negative  Pharmacy will continue to monitor and adjust per consult.   [name] D 02/03/2016 10:25 AM

## 2016-02-03 NOTE — Consult Note (Signed)
[place] [facility] Consultation     ASSESSMENT/PLAN   The patient is a 60 year old group home resident presents with pneumonia with septic shock. Severe hypernatremia with dehydration and AKI. Remains on vasopressors, looks very ill, Recommend hospice referral  PULMONARY A:Acute respiratory failure secondary to pneumonia with sepsis. P:   -broad-spectrum antibiotics for likely healthcare associated pneumonia. - Sputum cultures if possible. -Wean down oxygen as tolerated, will monitor patient's respiration status closely. Patient is DNR/DNI now -step down status  CARDIOVASCULAR -Septic shock due to pneumonia, with hypotension-resolved off vasopressors   RENAL A:  Acute kidney injury. -Severe hypernatremia, likely secondary to dehydration. P:   -Continue IV rehydration with saline. -Nephrology consulted.  INFECTIOUS A:  Pneumonia with sepsis. P:   Continue antibiotics.   BCx2 3/242: Pending UC 3/24: Pending Sputum-- Vancomycin 3/24>> Zosyn 3/20.>> MRSA and INF tests NEGATIVE    NEUROLOGIC A: Acute metabolic encephalopathy, likely due to septic shock as well as acute hypernatremia. -Continue treatment of underlying metabolic abnormalities.     Name: [name] MRN: [id number] DOB: 29-Apr-1956    ADMISSION DATE:  02/01/2016 CONSULTATION DATE:  02/01/16  REFERRING MD : Dr. Ether Griffins  CHIEF COMPLAINT:  Dyspnea.    HISTORY OF PRESENT ILLNESS:   Remains obtunded, on venti mask, looks very ill  No Known Allergies  REVIEW OF SYSTEMS:   Cannot provide a review of systems due to altered mental status.   VITAL SIGNS: Temp:  [98.8 F (37.1 C)-101.3 F (38.5 C)] 98.8 F (37.1 C) (03/26 0800) Pulse Rate:  [65-88] 67 (03/26 0800) Resp:  [17-37] 25 (03/26 0800) BP: (56-110)/(38-94) 108/51 mmHg (03/26 0800) SpO2:  [91 %-100 %] 91 % (03/26 0800) FiO2 (%):  [45 %-55 %] 45 % (03/26 0809) HEMODYNAMICS:   VENTILATOR SETTINGS: Vent Mode:  [-]    FiO2 (%):  [45 %-55 %] 45 % INTAKE / OUTPUT:  Intake/Output Summary (Last 24 hours) at 02/03/16 0955 Last data filed at 02/03/16 0800  Gross per 24 hour  Intake   4818 ml  Output   2850 ml  Net   1968 ml    Physical Examination:   VS: BP 108/51 mmHg  Pulse 67  Temp(Src) 98.8 F (37.1 C) (Oral)  Resp 25  Ht 5\' 7"  (1.702 m)  Wt 137 lb 12.6 oz (62.5 kg)  BMI 21.58 kg/m2  SpO2 91%  General Appearance:+distress  Neuro:without focal findings, mental status reduced. GCs8  HEENT: PERRLA, EOM intact, no ptosis, no other lesions noticed;  Pulmonary: normal breath sounds but decreased in right base. slight  Increased WOB CardiovascularNormal S1,S2.  No m/r/g.    Abdomen: Benign, Soft, non-tender, No masses, hepatosplenomegaly, No lymphadenopathy Renal:  No costovertebral tenderness  GU:  Not performed at this time. Endoc: No evident thyromegaly, no signs of acromegaly. Skin:   warm, no rashes, no ecchymosis  Extremities: normal, no cyanosis, clubbing.   LABS: Reviewed   LABORATORY PANEL:   CBC  Recent Labs Lab 02/03/16 0610  WBC 9.7  HGB 9.1*  HCT 27.4*  PLT 87*    Chemistries   Recent Labs Lab 02/01/16 0936  02/03/16 0610  NA 172*  < > 152*  K 3.7  < > 3.8  CL >130*  < > >130*  CO2 24  < > 19*  GLUCOSE 144*  < > 179*  BUN 79*  < > 70*  CREATININE 2.17*  < > 2.50*  CALCIUM 8.7*  < > 8.0*  MG  --   < >  2.4  PHOS  --   < > 5.1*  AST 56*  --   --   ALT 47  --   --   ALKPHOS 79  --   --   BILITOT 1.1  --   --   < > = values in this interval not displayed.   Recent Labs Lab 02/01/16 1552  GLUCAP 125*    Recent Labs Lab 02/01/16 1608  PHART 7.40  PCO2ART 34  PO2ART 62*    Recent Labs Lab 02/01/16 0936  AST 56*  ALT 47  ALKPHOS 79  BILITOT 1.1  ALBUMIN 3.5    Cardiac Enzymes  Recent Labs Lab 02/01/16 0936  TROPONINI <0.03    RADIOLOGY:  Ct Head Wo Contrast  02/01/2016  CLINICAL DATA:  Unresponsive EXAM: CT HEAD WITHOUT  CONTRAST TECHNIQUE: Contiguous axial images were obtained from the base of the skull through the vertex without intravenous contrast. COMPARISON:  None. FINDINGS: Bony calvarium is intact. 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 Scattered chronic white matter ischemic changes noted. No findings to suggest acute hemorrhage, acute infarction or space-occupying mass lesion are noted. Atrophic changes are again seen. IMPRESSION: Chronic changes without acute abnormality. Electronically Signed   By: Inez Catalina M.D.   On: 02/01/2016 11:08   US Renal  02/01/2016  CLINICAL DATA:  Acute renal failure. EXAM: RENAL / URINARY TRACT ULTRASOUND COMPLETE COMPARISON:  None. FINDINGS: Right Kidney: Length: 9.7 cm. Echogenicity within normal limits. No mass visualized. Slight fullness of the renal pelvis without talus seal dilatation. Left Kidney: Length: 9.9 cm. Echogenicity within normal limits. No solid mass or hydronephrosis visualized. 1.9 x 2.5 x 1.8 cm upper pole cyst. 8 mm interpolar cyst. Bladder: Decompressed by a Foley catheter. IMPRESSION: 1. Slight fullness of the right renal pelvis without frank hydronephrosis. 2. Left renal cysts. Electronically Signed   By: Logan Bores M.D.   On: 02/01/2016 14:01   Dg Chest Portable 1 View  02/01/2016  CLINICAL DATA:  Altered mental status EXAM: PORTABLE CHEST 1 VIEW COMPARISON:  None. FINDINGS: Cardiac enlargement. Single lead cardiac pacer with generator over the left thorax. Retrocardiac left lower lobe not evaluated on single AP film but there appears to be abnormal opacity in the area. On the right, there is a rounded area of opacity most consistent with infiltrate at the right lung base measuring about 6.5 cm in diameter. IMPRESSION: Right lower lobe infiltrate concerning for pneumonia. Followup PA and lateral chest X-ray is recommended in 3-4 weeks following trial of antibiotic therapy to ensure resolution and  exclude underlying malignancy. Retrocardiac left lower lobe not well evaluated. Airspace disease in this area is suspected as well. Electronically Signed   By: Skipper Cliche M.D.   On: 02/01/2016 10:17     The Patient requires high complexity decision making for assessment and support, frequent evaluation and titration of therapies, application of advanced monitoring technologies and extensive interpretation of multiple databases. Critical Care Time devoted to patient care services described in this note is 35 minutes.   Overall, patient is critically ill, prognosis is guarded.  Patient with Multiorgan failure and at high risk for cardiac arrest and death.    Corrin Parker, M.D.  [facility]  Medical Director [place] Director [facility] Cardio-Pulmonary Department

## 2016-02-03 NOTE — Progress Notes (Signed)
Pt has been sleeping most of morning, but is becoming more awake, mumbling and making sounds, repositioned in bed and woke up and stated "fuck you" to nursing staff. Foley and IV remain in place , urine output has been 1650 mls this shift. Pt remains Npo as is too lethargic to eat, mouthcaren give. O2 remains on at 1 L with sats of 94 % Md order for sats to be above 88%.

## 2016-02-03 NOTE — Progress Notes (Signed)
[place] [facility] [place] NAME: [name]    MR#:  [id number]  DATE OF BIRTH:  Oct 01, 1956  SUBJECTIVE:  CHIEF COMPLAINT:  Patient is less lethargic and arousable to verbal commands but still not providing good history  REVIEW OF SYSTEMS:  Review of systems unobtainable as patient is still lethargic  DRUG ALLERGIES:  No Known Allergies  VITALS:  Blood pressure 108/51, pulse 67, temperature 98.8 F (37.1 C), temperature source Oral, resp. rate 25, height 5\' 7"  (1.702 m), weight 62.5 kg (137 lb 12.6 oz), SpO2 91 %.  PHYSICAL EXAMINATION:  GENERAL:  60 y.o.-year-old patient lying in the bed with no acute distress.  EYES: Pupils equal, round, reactive to light and accommodation. No scleral icterus. Extraocular muscles intact.  HEENT: Head atraumatic, normocephalic. Oropharynx and nasopharynx clear.  NECK:  Supple, no jugular venous distention. No thyroid enlargement, no tenderness.  LUNGS: Moderate air entry with decreased breath sounds at the lower lung fields, wheezing, positive rales,rhonchi or crepitation. No use of accessory muscles of respiration.  CARDIOVASCULAR: S1, S2 normal. No murmurs, rubs, or gallops.  ABDOMEN: Soft, nontender, nondistended. Bowel sounds present. No organomegaly or mass.  EXTREMITIES: No pedal edema, cyanosis, or clubbing.  NEUROLOGIC: Altered Psych-altered mental status   CBC  Recent Labs Lab 02/03/16 0610  WBC 9.7  HGB 9.1*  HCT 27.4*  PLT 87*   ------------------------------------------------------------------------------------------------------------------  Chemistries   Recent Labs Lab 02/01/16 0936  02/03/16 0610  NA 172*  < > 152*  K 3.7  < > 3.8  CL >130*  < > >130*  CO2 24  < > 19*  GLUCOSE 144*  < > 179*  BUN 79*  < > 70*  CREATININE 2.17*  < > 2.50*  CALCIUM 8.7*  < > 8.0*  MG  --   < > 2.4  AST 56*  --   --   ALT 47  --   --   ALKPHOS 79  --   --   BILITOT 1.1  --   --    < > = values in this interval not displayed. ------------------------------------------------------------------------------------------------------------------  Cardiac Enzymes  Recent Labs Lab 02/01/16 0936  TROPONINI <0.03   ------------------------------------------------------------------------------------------------------------------  RADIOLOGY:  US Renal  02/01/2016  CLINICAL DATA:  Acute renal failure. EXAM: RENAL / URINARY TRACT ULTRASOUND COMPLETE COMPARISON:  None. FINDINGS: Right Kidney: Length: 9.7 cm. Echogenicity within normal limits. No mass visualized. Slight fullness of the renal pelvis without talus seal dilatation. Left Kidney: Length: 9.9 cm. Echogenicity within normal limits. No solid mass or hydronephrosis visualized. 1.9 x 2.5 x 1.8 cm upper pole cyst. 8 mm interpolar cyst. Bladder: Decompressed by a Foley catheter. IMPRESSION: 1. Slight fullness of the right renal pelvis without frank hydronephrosis. 2. Left renal cysts. Electronically Signed   By: Logan Bores M.D.   On: 02/01/2016 14:01    EKG:   Orders placed or performed during the hospital encounter of 02/01/16  . EKG 12-Lead  . EKG 12-Lead  . EKG 12-Lead  . EKG 12-Lead  . EKG 12-Lead  . EKG 12-Lead  . EKG 12-Lead  . EKG 12-Lead  . ED EKG  . ED EKG  . ED EKG  . ED EKG    ASSESSMENT AND PLAN:   #1. Sepsis with septic shock,  secondary to pneumonia  continue IV fluids , Levophed as needed for map 65 and greater    patient on broad-spectrum antibiotic therapy, including vancomycin, Zosyn  and Zithromax. Will discontinue vancomycin as MRSA screen is negative Continue IV fluids Appreciate pulmonology recommendations   #2. Right lower lobe pneumonia, healthcare associated,  continue patient on broad-spectrum antibiotic therapy, get sputum cultures if possible, adjust antibiotics depending on culture results  #3. Hypernatremia,  sodium trended down from 165-160 --152 and hyperchloremia continue  patient on  D5W and monitor , check sodium level Appreciate nephrology recommendations.  #4. Metabolic encephalopathy, secondary to sepsis and acute renal failure  neuro checks frequently, supportive care. Getting urinary drug screen  #5. Acute renal failure, separate acute TUBular necrosis   Foley catheter is in, follow ins and outs, kidney function closely Renal ultrasound is normal    #6. thrombocytopenia, l no active bleeding, monitor platelet counts closely 144-112-87,000. DC Lovenox SCDs. CBC a.m.   Discussed with Dr. Jenell Milliner   All the records are reviewed and case discussed with Care Management/Social Workerr. Management plans discussed with the patient, family and they are in agreement.  CODE STATUS: DNR  TOTAL CRITICAL CARE TIME TAKING CARE OF THIS PATIENT: 35 minutes.   POSSIBLE D/C IN ? DAYS, DEPENDING ON CLINICAL CONDITION.   Nicholes Mango M.D on 02/03/2016 at 12:02 PM  Between 7am to 6pm - Pager - [phone] After 6pm go to [email or web address] - password [email or web address] [place] [facility]  Office  [phone]  CC: Primary care physician; Vista Mink, FNP

## 2016-02-03 NOTE — Progress Notes (Signed)
Lab called with critical value of chlide of 130 and greater. DR Mauro Kaufmann notified, no new orders noted

## 2016-02-03 NOTE — Progress Notes (Signed)
Pts O2 sat noted to be in 70's on [place] @ 5lpm and 55% VM. It came up to 88 on NRB.  Coarse rhonchi which improved about 30 min later.

## 2016-02-03 NOTE — Progress Notes (Signed)
Called to patients room by NT doing routine vitals, patient o2 sat as 69 on venti mask, pulse rate 59 on telemetry, rate change to idoventricular from SR, called dr Jannifer Franklin to bedside to evaluate patient, respiratory at bedside switched to non rebreather 88%, stat ekg performed, calcium gluconate administered. Labs drawn, chest xray done, order to transfer patient to ICU, spoke to patients daughter latisha with change in status

## 2016-02-03 NOTE — Progress Notes (Signed)
Called by nurse about pt hypoxia.  Pt here with sepsis due to pneumonia, was in ICU, transferred to floor this afternoon.  This evening became lethargic, less responsive, then hypoxic.  Then began having some cardiac abnormalities, EKG showed frequent PVCs and widening QRS.  O2 sats improved some with venti mask. Calcium gluconate given, stat BMP, lactate, troponin ordered.  CXR ordered.  Has been on IV fluids for hypernatremia.  Has some unknown cardiac history with device implant (pacer/defibrillator?).  Concern for CHF vs aspiration superimposed on sepsis PNA.  Transfer to ICU, follow up studies.  BP stable at this time.  Minimum O2 goal >88-90%, higher if possible.  Jacqulyn Bath [facility] 02/03/2016, 10:42 PM

## 2016-02-03 NOTE — Progress Notes (Signed)
[facility]  ROUNDING NOTE   Subjective:  Patient more awake and alert today. Sodium currently down to 152. Urine output good at 2.7 L over the preceding 3 shifts. Renal function also improved.   Objective:  Vital signs in last 24 hours:  Temp:  [98.8 F (37.1 C)-101.3 F (38.5 C)] 98.8 F (37.1 C) (03/26 0800) Pulse Rate:  [65-88] 67 (03/26 0800) Resp:  [17-37] 25 (03/26 0800) BP: (56-110)/(38-94) 108/51 mmHg (03/26 0800) SpO2:  [91 %-100 %] 91 % (03/26 0800) FiO2 (%):  [45 %-55 %] 45 % (03/26 0809)  Weight change:  Filed Weights   02/01/16 0913 02/02/16 0458  Weight: 58.378 kg (128 lb 11.2 oz) 62.5 kg (137 lb 12.6 oz)    Intake/Output: I/O last 3 completed shifts: In: 5238.6 [I.V.:3816.1; IV Piggyback:1422.5] Out: 2750 [Urine:2750]   Intake/Output this shift:  Total I/O In: 150 [I.V.:150] Out: 300 [Urine:300]  Physical Exam: General: Critically ill appearing  Head: Normocephalic, atraumatic. Dry oral mucosal membranes  Eyes: Anicteric  Neck: Supple, trachea midline  Lungs:  Bilateral rhonchi normal effort  Heart: S1S2 no rubs  Abdomen:  Soft, nontender, BS present   Extremities: no peripheral edema.  Neurologic: Awake, does follow simple commands but minimally conversant.  Skin: No lesions       Basic Metabolic Panel:  Recent Labs Lab 02/01/16 1124 02/01/16 1554 02/01/16 1814 02/02/16 0251 02/03/16 0610  NA 169* 167* 167* 160* 152*  K 3.3* 3.3* 3.0* 3.3* 3.8  CL >130* >130* >130* >130* >130*  CO2 23 19* 21* 19* 19*  GLUCOSE 177* 147* 156* 230* 179*  BUN 80* 80* 81* 89* 70*  CREATININE 2.19* 2.30* 2.49* 2.99* 2.50*  CALCIUM 8.2* 8.3* 8.1* 7.9* 8.0*  MG  --   --   --  2.3 2.4  PHOS  --   --   --  1.5* 5.1*    Liver Function Tests:  Recent Labs Lab 02/01/16 0936  AST 56*  ALT 47  ALKPHOS 79  BILITOT 1.1  PROT 7.1  ALBUMIN 3.5   No results for input(s): LIPASE, AMYLASE in the last 168 hours. No results for input(s):  AMMONIA in the last 168 hours.  CBC:  Recent Labs Lab 02/01/16 0936 02/02/16 0251 02/03/16 0610  WBC 5.4 6.9 9.7  NEUTROABS 4.5  --  8.2*  HGB 11.5* 9.8* 9.1*  HCT 35.2* 29.7* 27.4*  MCV 94.8 93.4 95.0  PLT 141* 112* 87*    Cardiac Enzymes:  Recent Labs Lab 02/01/16 0936  TROPONINI <0.03    BNP: Invalid input(s): POCBNP  CBG:  Recent Labs Lab 02/01/16 1552  GLUCAP 125*    Microbiology: Results for orders placed or performed during the hospital encounter of 02/01/16  Urine culture     Status: None   Collection Time: 02/01/16  9:36 AM  Result Value Ref Range Status   Specimen Description URINE, RANDOM  Final   Special Requests NONE  Final   Culture NO GROWTH 2 DAYS  Final   Report Status 02/03/2016 FINAL  Final  Culture, blood (routine x 2)     Status: None (Preliminary result)   Collection Time: 02/01/16  9:36 AM  Result Value Ref Range Status   Specimen Description BLOOD LEFT HAND  Final   Special Requests BOTTLES DRAWN AEROBIC AND ANAEROBIC  1CC  Final   Culture NO GROWTH < 24 HOURS  Final   Report Status PENDING  Incomplete  Culture, blood (routine x 2)  Status: None (Preliminary result)   Collection Time: 02/01/16 10:00 AM  Result Value Ref Range Status   Specimen Description BLOOD RIGHT HAND  Final   Special Requests BOTTLES DRAWN AEROBIC AND ANAEROBIC  1CC  Final   Culture NO GROWTH < 24 HOURS  Final   Report Status PENDING  Incomplete  MRSA PCR Screening     Status: None   Collection Time: 02/01/16  5:00 PM  Result Value Ref Range Status   MRSA by PCR NEGATIVE NEGATIVE Final    Comment:        The GeneXpert MRSA Assay (FDA approved for NASAL specimens only), is one component of a comprehensive MRSA colonization surveillance program. It is not intended to diagnose MRSA infection nor to guide or monitor treatment for MRSA infections.   Urine culture     Status: None (Preliminary result)   Collection Time: 02/02/16  5:56 PM  Result  Value Ref Range Status   Specimen Description URINE, RANDOM  Final   Special Requests NONE  Final   Culture NO GROWTH < 12 HOURS  Final   Report Status PENDING  Incomplete    Coagulation Studies: No results for input(s): LABPROT, INR in the last 72 hours.  Urinalysis:  Recent Labs  02/01/16 0936 02/02/16 1756  COLORURINE YELLOW* YELLOW*  LABSPEC 1.014 1.011  PHURINE 5.0 5.0  GLUCOSEU NEGATIVE NEGATIVE  HGBUR NEGATIVE 3+*  BILIRUBINUR NEGATIVE NEGATIVE  KETONESUR NEGATIVE NEGATIVE  PROTEINUR NEGATIVE NEGATIVE  NITRITE NEGATIVE NEGATIVE  LEUKOCYTESUR NEGATIVE NEGATIVE      Imaging: Ct Head Wo Contrast  02/01/2016  CLINICAL DATA:  Unresponsive EXAM: CT HEAD WITHOUT CONTRAST TECHNIQUE: Contiguous axial images were obtained from the base of the skull through the vertex without intravenous contrast. COMPARISON:  None. FINDINGS: Bony calvarium is intact. 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 Scattered chronic white matter ischemic changes noted. No findings to suggest acute hemorrhage, acute infarction or space-occupying mass lesion are noted. Atrophic changes are again seen. IMPRESSION: Chronic changes without acute abnormality. Electronically Signed   By: Inez Catalina M.D.   On: 02/01/2016 11:08   US Renal  02/01/2016  CLINICAL DATA:  Acute renal failure. EXAM: RENAL / URINARY TRACT ULTRASOUND COMPLETE COMPARISON:  None. FINDINGS: Right Kidney: Length: 9.7 cm. Echogenicity within normal limits. No mass visualized. Slight fullness of the renal pelvis without talus seal dilatation. Left Kidney: Length: 9.9 cm. Echogenicity within normal limits. No solid mass or hydronephrosis visualized. 1.9 x 2.5 x 1.8 cm upper pole cyst. 8 mm interpolar cyst. Bladder: Decompressed by a Foley catheter. IMPRESSION: 1. Slight fullness of the right renal pelvis without frank hydronephrosis. 2. Left renal cysts. Electronically Signed   By: Logan Bores  M.D.   On: 02/01/2016 14:01     Medications:   . dextrose 150 mL/hr at 02/03/16 0842  . norepinephrine Stopped (02/02/16 0800)   . albuterol  2.5 mg Nebulization Q4H  . aspirin  300 mg Rectal Daily  . azithromycin  500 mg Intravenous Q24H  . heparin  5,000 Units Subcutaneous 3 times per day  . ipratropium  0.5 mg Nebulization Q4H  . levETIRAcetam  750 mg Intravenous Q12H  . LORazepam  0.25 mg Intravenous Q12H  . pantoprazole (PROTONIX) IV  40 mg Intravenous Q24H  . piperacillin-tazobactam (ZOSYN)  IV  3.375 g Intravenous 3 times per day  . risperiDONE  1 mg Oral BID  . sodium chloride flush  3  mL Intravenous Q12H  . vancomycin  750 mg Intravenous Q24H   acetaminophen, LORazepam, morphine injection, ondansetron **OR** ondansetron (ZOFRAN) IV  Assessment/ Plan:  60 y.o. male with no significant past medical history , who was admitted to [facility] on 02/01/2016 for evaluation of altered mental status. He was found have severe metabolic derangements upon admission. He was found to have a right middle lobe pneumonia. His serum sodium was very high at 167 with chloride greater than 130. BUN was high at 80 with a creatinine of 2.1.  1. Acute renal failure secondary to acute tubular necrosis, in the setting of pneumonia. - Unknown baseline renal function. Presenting creatinine was 2.1. - Good urine output noted of 2.7 L over the preceding 3 shifts. Renal function improved creatinine down to 2.5. Therefore no indication for dialysis at the moment. Continue to monitor renal parameters daily.  2. Severe hypernatremia. Sodium has come down to 152. Continue D5W at 150 cc per hour. Hyperchloremia also coexistent.  3. Right lower lobe bacterial pneumonia. Patient remains on azithromycin, Zosyn, and vancomycin.  4. Hypokalemia.  Potassium up to 3.8 and acceptable.   LOS: 2 [name] 3/26/201710:23 AM

## 2016-02-04 ENCOUNTER — Inpatient Hospital Stay: Payer: Medicare Other

## 2016-02-04 DIAGNOSIS — L8990 Pressure ulcer of unspecified site, unspecified stage: Secondary | ICD-10-CM | POA: Insufficient documentation

## 2016-02-04 LAB — BASIC METABOLIC PANEL
Anion gap: 5 (ref 5–15)
Anion gap: 3 — ABNORMAL LOW (ref 5–15)
BUN: 50 mg/dL — AB (ref 6–20)
BUN: 41 mg/dL — AB (ref 6–20)
CHLORIDE: 128 mmol/L — AB (ref 101–111)
CO2: 18 mmol/L — AB (ref 22–32)
CO2: 18 mmol/L — ABNORMAL LOW (ref 22–32)
CREATININE: 1.52 mg/dL — AB (ref 0.61–1.24)
Calcium: 8.5 mg/dL — ABNORMAL LOW (ref 8.9–10.3)
Calcium: 8.3 mg/dL — ABNORMAL LOW (ref 8.9–10.3)
Chloride: 129 mmol/L — ABNORMAL HIGH (ref 101–111)
Creatinine, Ser: 1.91 mg/dL — ABNORMAL HIGH (ref 0.61–1.24)
GFR calc Af Amer: 57 mL/min — ABNORMAL LOW (ref >60)
GFR calc non Af Amer: 37 mL/min — ABNORMAL LOW (ref >60)
GFR, EST AFRICAN AMERICAN: 43 mL/min — AB (ref >60)
GFR, EST NON AFRICAN AMERICAN: 49 mL/min — AB (ref >60)
GLUCOSE: 160 mg/dL — AB (ref 65–99)
Glucose, Bld: 142 mg/dL — ABNORMAL HIGH (ref 65–99)
POTASSIUM: 3.5 mmol/L (ref 3.5–5.1)
Potassium: 3.2 mmol/L — ABNORMAL LOW (ref 3.5–5.1)
SODIUM: 151 mmol/L — AB (ref 135–145)
SODIUM: 150 mmol/L — AB (ref 135–145)

## 2016-02-04 LAB — CBC
HEMATOCRIT: 28.5 % — AB (ref 40.0–52.0)
HEMOGLOBIN: 9 g/dL — AB (ref 13.0–18.0)
MCH: 30.3 pg (ref 26.0–34.0)
MCHC: 31.6 g/dL — AB (ref 32.0–36.0)
MCV: 96 fL (ref 80.0–100.0)
Platelets: 90 10*3/uL — ABNORMAL LOW (ref 150–440)
RBC: 2.97 MIL/uL — AB (ref 4.40–5.90)
RDW: 16.5 % — ABNORMAL HIGH (ref 11.5–14.5)
WBC: 12 10*3/uL — ABNORMAL HIGH (ref 3.8–10.6)

## 2016-02-04 LAB — PHOSPHORUS: PHOSPHORUS: 2.6 mg/dL (ref 2.5–4.6)

## 2016-02-04 LAB — URINE CULTURE: Culture: NO GROWTH

## 2016-02-04 LAB — MAGNESIUM
MAGNESIUM: 2.4 mg/dL (ref 1.7–2.4)
MAGNESIUM: 2.4 mg/dL (ref 1.7–2.4)

## 2016-02-04 LAB — TROPONIN I
TROPONIN I: 0.17 ng/mL — AB (ref <0.031)
Troponin I: 0.18 ng/mL — ABNORMAL HIGH (ref <0.031)
Troponin I: 0.09 ng/mL — ABNORMAL HIGH (ref <0.031)

## 2016-02-04 MED ORDER — POTASSIUM CHLORIDE 10 MEQ/100ML IV SOLN
10.0000 meq | INTRAVENOUS | Status: AC
  Administered 2016-02-04 (×2): 10 meq via INTRAVENOUS
  Filled 2016-02-04 (×2): qty 100

## 2016-02-04 NOTE — Progress Notes (Addendum)
[facility] nurse notified about recent CXR showing mediastinal shift. Nurse will relay message to [place] doc and follow-up. Pt currently on Bipap @40 %, Sat 92 and above. Will continue to monitor respiratory status.

## 2016-02-04 NOTE — Progress Notes (Signed)
[facility]  ROUNDING NOTE   Subjective:   UOP 4250 Tmax 101.1 Na 151 (148) K 3.5 (2.7)  Creatinine 1.91 (2.03)  Transferred to ICU due to hypoxia   Objective:  Vital signs in last 24 hours:  Temp:  [98.8 F (37.1 C)-101.1 F (38.4 C)] 99.5 F (37.5 C) (03/27 0800) Pulse Rate:  [31-133] 133 (03/27 0800) Resp:  [16-33] 24 (03/27 0800) BP: (75-126)/(46-90) 93/71 mmHg (03/27 0800) SpO2:  [87 %-100 %] 93 % (03/27 0830) FiO2 (%):  [100 %] 100 % (03/27 0830) Weight:  [69 kg (152 lb 1.9 oz)] 69 kg (152 lb 1.9 oz) (03/26 2300)  Weight change:  Filed Weights   02/01/16 0913 02/02/16 0458 02/03/16 2300  Weight: 58.378 kg (128 lb 11.2 oz) 62.5 kg (137 lb 12.6 oz) 69 kg (152 lb 1.9 oz)    Intake/Output: I/O last 3 completed shifts: In: 2928 [I.V.:1953; IV Piggyback:975] Out: [id number] [Urine:5550; Stool:1]   Intake/Output this shift:  Total I/O In: 50 [IV Piggyback:50] Out: -   Physical Exam: General: Critically ill appearing  Head: Normocephalic, atraumatic. Dry oral mucosal membranes, +venturi mask  Eyes: Anicteric  Neck: Supple, trachea midline  Lungs:  Bilateral rhonchi normal effort  Heart: tachycardia  Abdomen:  Soft, nontender, BS present   Extremities: no peripheral edema.  Neurologic: Awake, not following commands.  Skin: No lesions       Basic Metabolic Panel:  Recent Labs Lab 02/01/16 1814 02/02/16 0251 02/03/16 0610 02/03/16 2214 02/04/16 0531  NA 167* 160* 152* 148* 151*  K 3.0* 3.3* 3.8 2.7* 3.5  CL >130* >130* >130* 127* 128*  CO2 21* 19* 19* 18* 18*  GLUCOSE 156* 230* 179* 222* 142*  BUN 81* 89* 70* 54* 50*  CREATININE 2.49* 2.99* 2.50* 2.03* 1.91*  CALCIUM 8.1* 7.9* 8.0* 8.1* 8.5*  MG  --  2.3 2.4  --   --   PHOS  --  1.5* 5.1*  --   --     Liver Function Tests:  Recent Labs Lab 02/01/16 0936  AST 56*  ALT 47  ALKPHOS 79  BILITOT 1.1  PROT 7.1  ALBUMIN 3.5   No results for input(s): LIPASE, AMYLASE in the last  168 hours. No results for input(s): AMMONIA in the last 168 hours.  CBC:  Recent Labs Lab 02/01/16 0936 02/02/16 0251 02/03/16 0610 02/04/16 0531  WBC 5.4 6.9 9.7 12.0*  NEUTROABS 4.5  --  8.2*  --   HGB 11.5* 9.8* 9.1* 9.0*  HCT 35.2* 29.7* 27.4* 28.5*  MCV 94.8 93.4 95.0 96.0  PLT 141* 112* 87* 90*    Cardiac Enzymes:  Recent Labs Lab 02/01/16 0936 02/03/16 2214 02/04/16 0531  TROPONINI <0.03 0.10* 0.18*    BNP: Invalid input(s): POCBNP  CBG:  Recent Labs Lab 02/01/16 1552 02/03/16 2259  GLUCAP 125* 68*    Microbiology: Results for orders placed or performed during the hospital encounter of 02/01/16  Urine culture     Status: None   Collection Time: 02/01/16  9:36 AM  Result Value Ref Range Status   Specimen Description URINE, RANDOM  Final   Special Requests NONE  Final   Culture NO GROWTH 2 DAYS  Final   Report Status 02/03/2016 FINAL  Final  Culture, blood (routine x 2)     Status: None (Preliminary result)   Collection Time: 02/01/16  9:36 AM  Result Value Ref Range Status   Specimen Description BLOOD LEFT HAND  Final  Special Requests BOTTLES DRAWN AEROBIC AND ANAEROBIC  1CC  Final   Culture NO GROWTH < 24 HOURS  Final   Report Status PENDING  Incomplete  Culture, blood (routine x 2)     Status: None (Preliminary result)   Collection Time: 02/01/16 10:00 AM  Result Value Ref Range Status   Specimen Description BLOOD RIGHT HAND  Final   Special Requests BOTTLES DRAWN AEROBIC AND ANAEROBIC  1CC  Final   Culture NO GROWTH < 24 HOURS  Final   Report Status PENDING  Incomplete  MRSA PCR Screening     Status: None   Collection Time: 02/01/16  5:00 PM  Result Value Ref Range Status   MRSA by PCR NEGATIVE NEGATIVE Final    Comment:        The GeneXpert MRSA Assay (FDA approved for NASAL specimens only), is one component of a comprehensive MRSA colonization surveillance program. It is not intended to diagnose MRSA infection nor to guide  or monitor treatment for MRSA infections.   Urine culture     Status: None (Preliminary result)   Collection Time: 02/02/16  5:56 PM  Result Value Ref Range Status   Specimen Description URINE, RANDOM  Final   Special Requests NONE  Final   Culture NO GROWTH < 12 HOURS  Final   Report Status PENDING  Incomplete    Coagulation Studies: No results for input(s): LABPROT, INR in the last 72 hours.  Urinalysis:  Recent Labs  02/01/16 0936 02/02/16 1756  COLORURINE YELLOW* YELLOW*  LABSPEC 1.014 1.011  PHURINE 5.0 5.0  GLUCOSEU NEGATIVE NEGATIVE  HGBUR NEGATIVE 3+*  BILIRUBINUR NEGATIVE NEGATIVE  KETONESUR NEGATIVE NEGATIVE  PROTEINUR NEGATIVE NEGATIVE  NITRITE NEGATIVE NEGATIVE  LEUKOCYTESUR NEGATIVE NEGATIVE      Imaging: Dg Chest Port 1 View  02/03/2016  CLINICAL DATA:  Acute onset of hypoxia.  Initial encounter. EXAM: PORTABLE CHEST 1 VIEW COMPARISON:  Chest radiograph from 02/01/2016 FINDINGS: The lungs are well-aerated. Bibasilar airspace opacities, left greater than right, raise concern for multifocal pneumonia, though superimposed pulmonary edema cannot be excluded. Small bilateral pleural effusions are seen. No pneumothorax is identified. The cardiomediastinal silhouette is the borderline normal in size. An AICD is noted overlying the left chest wall, with a single lead ending overlying the right ventricle. No acute osseous abnormalities are seen. IMPRESSION: Bibasilar airspace opacities, left greater than right, raise concern for multifocal pneumonia, though superimposed pulmonary edema cannot be excluded. Small bilateral pleural effusions seen. Electronically Signed   By: Garald Balding M.D.   On: 02/03/2016 23:00     Medications:   . dextrose Stopped (02/03/16 2300)   . aspirin  300 mg Rectal Daily  . azithromycin  500 mg Intravenous Q24H  . ipratropium-albuterol  3 mL Nebulization Q6H  . levETIRAcetam  750 mg Intravenous Q12H  . LORazepam  0.25 mg Intravenous  Q12H  . pantoprazole (PROTONIX) IV  40 mg Intravenous Q24H  . piperacillin-tazobactam (ZOSYN)  IV  3.375 g Intravenous 3 times per day  . potassium chloride  10 mEq Intravenous Q1 Hr x 2  . risperiDONE  1 mg Oral BID  . sodium chloride flush  3 mL Intravenous Q12H   acetaminophen, LORazepam, morphine injection, ondansetron **OR** ondansetron (ZOFRAN) IV  Assessment/ Plan:  60 y.o. black male with no significant past medical history, who was admitted to [facility] on 02/01/2016 for evaluation of altered mental status. He was found have severe metabolic derangements upon admission. He was found to have  a right middle lobe pneumonia.   1. Acute renal failure secondary to acute tubular necrosis, in the setting of pneumonia. Creatinine improved, nonoliguric urine output. No acute indication for dialysis. Renally dose all medications.  No known baseline creatinine.   2. Hypernatremia: Free water deficit of 3.3 litres. Now with pulmonary edema with D5W gtt. Currently off IV fluids.  - echocardiogram - SLP consult for swallow eval, will need to be drinking water.    3. Metabolic Acidosis: CO2 18. With hypokalemia.  - will consider sodium bicarbonate if sodium can be improved.   4. Right lower lobe bacterial pneumonia. Febrile overnight. Patient remains on azithromycin, Zosyn, and vancomycin.    LOS: [place], Caelie [place] 3/27/20179:24 AM

## 2016-02-04 NOTE — Progress Notes (Addendum)
Per Dr. Jannifer Franklin, keep pt on non-rebreather and hold the continuous fluids(D5).   Pt tachypneic. Currently on a non-rebreather mask. Sats 93% and above. Pt denies pain. HOB slightly elevated. E-Link doctor notified. No new orders given at this time.

## 2016-02-04 NOTE — Plan of Care (Signed)
Problem: Fluid and Electrolyte Imbalance Goal: Ability to maintain adequate oxygenation and ventilation will improve Outcome: Progressing Na+ 151 and K= 3.5. Mg WNL. BUN50. Creat 1.9.  Potassium runs iv x2.  D5W at 113ml/ hr DC'd  Problem: Safety: Goal: Ability to remain free from injury will improve Outcome: Progressing No injury  Problem: Health Behavior/Discharge Planning: Goal: Ability to manage health-related needs will improve Outcome: Not Progressing Care manager working on discharge planning  Problem: Pain Managment: Goal: General experience of comfort will improve Outcome: Progressing Denies pain. Morphine 2 given for tachypnea  Problem: Physical Regulation: Goal: Ability to maintain clinical measurements within normal limits will improve Outcome: Progressing VSS.  Sats good on 40% bipap.  Stated he breaths better on bipap.  He does not pull bipap off Goal: Will remain free from infection Outcome: Progressing Temp max 99.8 oral . WBC 12  Problem: Skin Integrity: Goal: Risk for impaired skin integrity will decrease Outcome: Progressing Boggy grey heels with foam in place. Difficult to keep heels elevated as pt frequently moves legs and kicks out pillows  Problem: Activity: Goal: Risk for activity intolerance will decrease Outcome: Progressing Remains on bedrest.  Drowsy.  Moves around himself in bed  Problem: Fluid Volume: Goal: Ability to maintain a balanced intake and output will improve Outcome: Progressing UOP adequate.   Problem: Nutrition: Goal: Adequate nutrition will be maintained Outcome: Progressing SPLT in for swallow eval. Diet ordered.  Pt fed minimal amount today. He was placed on BIPAP after CXR showed collapse left lung  Problem: Bowel/Gastric: Goal: Will not experience complications related to bowel motility Outcome: Progressing Incontinent of loose brown stool

## 2016-02-04 NOTE — Progress Notes (Signed)
[place] [facility] [place] NAME: Geofrey Harbold    MR#:  [id number]  DATE OF BIRTH:  January 25, 1956  SUBJECTIVE:  CHIEF COMPLAINT:  Patient is lethargic and arousable to verbal commands . On BiPAP Per speech therapy patient was swallowing with no aspiration  REVIEW OF SYSTEMS:  Review of systems unobtainable as patient is still lethargic  DRUG ALLERGIES:  No Known Allergies  VITALS:  Blood pressure 88/56, pulse 95, temperature 99.3 F (37.4 C), temperature source Oral, resp. rate 33, height 5\' 7"  (1.702 m), weight 69 kg (152 lb 1.9 oz), SpO2 94 %.  PHYSICAL EXAMINATION:  GENERAL:  60 y.o.-year-old patient lying in the bed with no acute distress.  EYES: Pupils equal, round, reactive to light and accommodation. No scleral icterus. Extraocular muscles intact.  HEENT: Head atraumatic, normocephalic. Oropharynx and nasopharynx clear.  NECK:  Supple, no jugular venous distention. No thyroid enlargement, no tenderness.  LUNGS: Moderate air entry with decreased breath sounds at the lower lung fields, wheezing, positive rales,rhonchi or crepitation. No use of accessory muscles of respiration.  CARDIOVASCULAR: S1, S2 normal. No murmurs, rubs, or gallops.  ABDOMEN: Soft, nontender, nondistended. Bowel sounds present. No organomegaly or mass.  EXTREMITIES: No pedal edema, cyanosis, or clubbing.  NEUROLOGIC: Altered Psych-altered mental status   CBC  Recent Labs Lab 02/04/16 0531  WBC 12.0*  HGB 9.0*  HCT 28.5*  PLT 90*   ------------------------------------------------------------------------------------------------------------------  Chemistries   Recent Labs Lab 02/01/16 0936  02/04/16 0531 02/04/16 1006  NA 172*  < > 151*  --   K 3.7  < > 3.5  --   CL >130*  < > 128*  --   CO2 24  < > 18*  --   GLUCOSE 144*  < > 142*  --   BUN 79*  < > 50*  --   CREATININE 2.17*  < > 1.91*  --   CALCIUM 8.7*  < > 8.5*  --   MG  --   < >  --   2.4  AST 56*  --   --   --   ALT 47  --   --   --   ALKPHOS 79  --   --   --   BILITOT 1.1  --   --   --   < > = values in this interval not displayed. ------------------------------------------------------------------------------------------------------------------  Cardiac Enzymes  Recent Labs Lab 02/04/16 1006  TROPONINI 0.09*   ------------------------------------------------------------------------------------------------------------------  RADIOLOGY:  Dg Chest Port 1 View  02/04/2016  CLINICAL DATA:  Found unresponsive at group home. Respiratory distress, pneumonia. EXAM: PORTABLE CHEST 1 VIEW COMPARISON:  02/03/2016 and 02/01/2016. FINDINGS: 1017 hours. Two views were obtained. There is new complete opacification of the left hemithorax with volume loss and mediastinal shift to the left. Patchy right basilar airspace disease is unchanged. There is no evidence of pneumothorax or significant pleural effusion on the right. Left subclavian AICD lead appears unchanged. No acute osseous findings are seen. IMPRESSION: Complete left lung collapse, likely due to progressive pneumonia as correlated with recent prior studies. Underlying central neoplasm cannot be excluded, and continued follow-up warranted. Electronically Signed   By: Richardean Sale M.D.   On: 02/04/2016 10:55   Dg Chest Port 1 View  02/03/2016  CLINICAL DATA:  Acute onset of hypoxia.  Initial encounter. EXAM: PORTABLE CHEST 1 VIEW COMPARISON:  Chest radiograph from 02/01/2016 FINDINGS: The lungs are well-aerated. Bibasilar airspace opacities, left greater  than right, raise concern for multifocal pneumonia, though superimposed pulmonary edema cannot be excluded. Small bilateral pleural effusions are seen. No pneumothorax is identified. The cardiomediastinal silhouette is the borderline normal in size. An AICD is noted overlying the left chest wall, with a single lead ending overlying the right ventricle. No acute osseous  abnormalities are seen. IMPRESSION: Bibasilar airspace opacities, left greater than right, raise concern for multifocal pneumonia, though superimposed pulmonary edema cannot be excluded. Small bilateral pleural effusions seen. Electronically Signed   By: Garald Balding M.D.   On: 02/03/2016 23:00    EKG:   Orders placed or performed during the hospital encounter of 02/01/16  . EKG 12-Lead  . EKG 12-Lead  . EKG 12-Lead  . EKG 12-Lead  . EKG 12-Lead  . EKG 12-Lead  . EKG 12-Lead  . EKG 12-Lead  . ED EKG  . ED EKG  . ED EKG  . ED EKG  . EKG 12-Lead  . EKG 12-Lead    ASSESSMENT AND PLAN:   #1. Sepsis with septic shock,  secondary to pneumonia  Not in shock anymore continue IV fluids , Levophed as needed for map 65 and greater    patient on broad-spectrum antibiotic therapy, including  Zosyn and Zithromax. discontinued vancomycin as MRSA screen is negative Continue IV fluids Appreciate pulmonology recommendations   #2. Right lower lobe pneumonia, healthcare associated,  continue patient on broad-spectrum antibiotic therapy, get sputum cultures if possible, adjust antibiotics depending on culture results  #3. Hypernatremia,  sodium trended down from 165-160 --152 and he is in--151 and hyperchloremia continue patient on  D5W and monitor , check sodium level Appreciate nephrology recommendations.  #4. Metabolic encephalopathy, secondary to sepsis and acute renal failure  neuro checks frequently, supportive care. Getting urinary drug screen  #5. Acute renal failure, separate acute TUBular necrosis   Foley catheter is in, and output 3.4 L yesterday, kidney function closely Renal ultrasound is normal   appreciate nephrology recommendations   #6. thrombocytopenia, l no active bleeding, monitor platelet counts closely 144-112-87,000. DC Lovenox SCDs. CBC a.m.   Discussed with Dr. Juleen China   All the records are reviewed and case discussed with Care Management/Social  Workerr. Management plans discussed with the patient, family and they are in agreement.  CODE STATUS: DNR  TOTAL CRITICAL CARE TIME TAKING CARE OF THIS PATIENT: 35 minutes.   POSSIBLE D/C IN ? DAYS, DEPENDING ON CLINICAL CONDITION.   Nicholes Mango M.D on 02/04/2016 at 1:48 PM  Between 7am to 6pm - Pager - [phone] After 6pm go to [email or web address] - password [email or web address] [facility]  Office  [phone]  CC: Primary care physician; Vista Mink, FNP

## 2016-02-04 NOTE — Progress Notes (Signed)
Dr Stevenson Clinch called regarding CXR report: Left lung collapse.  Orders for BIPAP.  RT notified

## 2016-02-04 NOTE — Progress Notes (Deleted)
IR here for thoracentesis on right by Dr Anselm Pancoast.  Pt alert and able to teachback what  The thoracentesis procedure is and why it is being done.  She has signed her consent. I asked if she wanted me to call her daughter prior.  She said "it is not necessary"

## 2016-02-04 NOTE — Progress Notes (Signed)
eLink Physician-Brief Progress Note Patient Name: [name] DOB: May 08, 1956 MRN: [id number]   Date of Service  02/04/2016  HPI/Events of Note  8 PM CXR >> "white out" of L lung c/w mucous plugging. Sat = 95% and RR = 27.  eICU Interventions  Dr. Ashby Dawes notified of finding. He doesn't feel that urgent bronchoscopy indicated. Will continue Chest PT and PD.        [name],[name] [name] 02/04/2016, 9:07 PM

## 2016-02-04 NOTE — Evaluation (Signed)
Clinical/Bedside Swallow Evaluation Patient Details  Name: [name] MRN: [id number] Date of Birth: 05/20/1956  Today's Date: 02/04/2016 Time: SLP Start Time (ACUTE ONLY): 1200 SLP Stop Time (ACUTE ONLY): 1300 SLP Time Calculation (min) (ACUTE ONLY): 60 min  Past Medical History:  Past Medical History  Diagnosis Date  . Discontinued smoking    Past Surgical History: History reviewed. No pertinent past surgical history. HPI:  Pt is a 60 y.o. male with unknown past medical history who was brought to the hospital because of poor responsiveness. Apparently patient is a resident of group home, usually is independent in his activities, however, since yesterday, was noted to be laying around and is eating somnolent and today he became poorly responsive, he is he was brought to emergency room for further evaluation and treatment, chest x-ray revealed right lower lobe pneumonia, he was hypotensive, systolic blood pressure in 70s on arrival to the hospital, also hypothermic, he was given 2 L of IV fluids, however, blood pressure still remains low. Labs revealed a hypernatremia, acute renal failure, mild anemia, thrombocytopenia, lactic acidosis. Pt was dx'd w/ pneumonia and septic shock. Recent CXR f\u - "L lung collapse, new compared to CXR on 3/24, Most likely mucus plugged is the reason for the this new finding.", per MD note. Pt has weaned from BiPAP to NRB to  O2 support currently w/ stable O2 sats and RR/HR per NSG. Pt is awake w/ mumbled speech giving few responses to basic questions; he does not follow instruction and requires max. cues for follow through. Pt has been NPO. Noted dried saliva at mouth.   Assessment / Plan / Recommendation Clinical Impression  Pt appeared to adequately tolerate trials of ice chips, Nectar consistency liquids via small, single sips from tsp then straw(pinched to monitor amt), and trials of puree (1/2 tsp size) w/ no immediate, overt s/s of aspiration noted. No  immediate coughing or change in vocal quality noted during mumbled speech; no decline in O2 sats or RR w/ po trials. However, pt exhibited declined Cognitive awareness for po trials and required max verbal/tactile cues for follow through w/ po tasks/trials. His declined Cognitive awareness impacted the oral phase of swallowing which can then increased risk for pharyngeal phase deficits. Pt was given rest breaks to avoid any increased WOB w/ exertion of po trials - no overt decline in O2 sats or RR/HR occured during po trials given. Pt's respiratory rate and O2 sats fluctuated around his baseline. Pt required full feeding assistance and cues including monitoring of oral clearing w/ po trials. Of note, post po trials, pt was resting and increased pharyngeal wetness was noted during breathing (increased saliva pooling from the po trials?). Pt appeares at moderate risk for aspiration sec. to declined Cognitive and Respiratory status' overall. Rec. initiate a dysphagia 1 diet w/ Nectar consistency liqudis w/ strict aspiration precautions during feeding. ST will f/u to assess tolerance of diet consistency for any potential upgrade next 1-3 days. NSG updated.     Aspiration Risk  Moderate aspiration risk    Diet Recommendation  Dys. 1 w/ Nectar liquids; strict aspiration precautions; full feeding assistance at all meals w/ upright positioning(head forward).   Medication Administration: Crushed with puree    Other  Recommendations Recommended Consults:  (Dietician) Oral Care Recommendations: Oral care BID;Staff/trained caregiver to provide oral care Other Recommendations: Order thickener from pharmacy;Prohibited food (jello, ice cream, thin soups);Remove water pitcher   Follow up Recommendations  Skilled Nursing facility (TBD)    Frequency  and Duration min 3x week  2 weeks       Prognosis Prognosis for Safe Diet Advancement: Fair Barriers to Reach Goals: Cognitive deficits      Swallow Study    General Date of Onset: 02/01/16 HPI: Pt is a 60 y.o. male with unknown past medical history who was brought to the hospital because of poor responsiveness. Apparently patient is a resident of group home, usually is independent in his activities, however, since yesterday, was noted to be laying around and is eating somnolent and today he became poorly responsive, he is he was brought to emergency room for further evaluation and treatment, chest x-ray revealed right lower lobe pneumonia, he was hypotensive, systolic blood pressure in 70s on arrival to the hospital, also hypothermic, he was given 2 L of IV fluids, however, blood pressure still remains low. Labs revealed a hypernatremia, acute renal failure, mild anemia, thrombocytopenia, lactic acidosis. Pt was dx'd w/ pneumonia and septic shock. Recent CXR f\u - "L lung collapse, new compared to CXR on 3/24, Most likely mucus plugged is the reason for the this new finding.", per MD note. Pt has weaned from BiPAP to NRB to [place] O2 support currently w/ stable O2 sats and RR/HR per NSG. Pt is awake w/ mumbled speech giving few responses to basic questions; he does not follow instruction and requires max. cues for follow through. Pt has been NPO. Noted dried saliva at mouth. Type of Study: Bedside Swallow Evaluation Previous Swallow Assessment: none indicated Diet Prior to this Study:  (unknown) Temperature Spikes Noted: Yes (wbc 12.0) Respiratory Status: Nasal cannula (4 liters) History of Recent Intubation: No Behavior/Cognition: Pleasant mood;Confused;Lethargic/Drowsy;Distractible;Requires cueing;Doesn't follow directions (awake) Oral Cavity Assessment:  (unable to assess sec. to declined Cognitive status) Oral Care Completed by SLP:  (unable to complete sec. to declined Cognitive status) Oral Cavity - Dentition:  (some native dentition (front) noted) Vision:  (n/a) Self-Feeding Abilities: Total assist Patient Positioning: Postural control adequate for  testing (head turn to the Left) Baseline Vocal Quality: Low vocal intensity (mumbled speech) Volitional Cough: Cognitively unable to elicit Volitional Swallow: Unable to elicit    Oral/Motor/Sensory Function Overall Oral Motor/Sensory Function: Mild impairment (overall decreased movements; clenched teeth) Facial Symmetry: Within Functional Limits   Ice Chips Ice chips: Impaired Presentation: Spoon (fed; 3 trials) Oral Phase Impairments: Poor awareness of bolus;Reduced lingual movement/coordination (Cognitive deficits) Oral Phase Functional Implications: Prolonged oral transit Pharyngeal Phase Impairments:  (none)   Thin Liquid Thin Liquid: Not tested    Nectar Thick Nectar Thick Liquid: Impaired Presentation: Spoon;Straw (fed; 6 trials then ~1-2 via straw(pinched to monitor amt)) Oral Phase Impairments: Poor awareness of bolus;Reduced lingual movement/coordination Oral phase functional implications: Prolonged oral transit Pharyngeal Phase Impairments:  (none)   Honey Thick Honey Thick Liquid: Not tested   Puree Puree: Impaired Presentation: Spoon (fed; 6 trials) Oral Phase Impairments: Reduced lingual movement/coordination;Impaired mastication;Poor awareness of bolus (Cognitive deficits) Oral Phase Functional Implications: Prolonged oral transit Pharyngeal Phase Impairments:  (none)   Solid   GO   Solid: Not tested       Orinda Kenner, MS, CCC-SLP   Watson,Katherine 02/04/2016,3:49 PM

## 2016-02-04 NOTE — Progress Notes (Signed)
Dr. Juanell Fairly notified of CXR findings by Dr. Oletta Darter from [place]. Per Dr. Juanell Fairly, no urgent bronchoscopy is necessary at this time. This RN will continue to monitor pt closely, and notify eLink if any respiratory changes occur.

## 2016-02-04 NOTE — Consult Note (Signed)
MEDICATION RELATED CONSULT NOTE - FOLLOW UP  Pharmacy Consult for electrolyte management Indication: hypokalemia, hypomagnesemia  No Known Allergies  Patient Measurements: Height: 5\' 7"  (170.2 cm) Weight: 152 lb 1.9 oz (69 kg) IBW/kg (Calculated) : 66.1  Vital Signs: Temp: 99.9 F (37.7 C) (03/27 1400) Temp Source: Core (Comment) (03/27 0400) BP: 107/64 mmHg (03/27 1400) Pulse Rate: 89 (03/27 1400) Intake/Output from previous day: 03/26 0701 - 03/27 0700 In: 770.5 [I.V.:153; IV Piggyback:617.5] Out: 4251 [Urine:4250; Stool:1] Intake/Output from this shift: Total I/O In: 500 [IV Piggyback:500] Out: 976 [Urine:975; Stool:1]  Assessment: DG is a 60yo male admitted for PNA. Pharmacy consulted to manage electrolytes in this patient. 3/27 K 3.5, Mag 2.4.  3/26 phos 5.1  Plan:  Will give KCl 67mEq IV x2. Will recheck with AM labs.  Vena Rua 02/04/2016,3:41 PM

## 2016-02-04 NOTE — Consult Note (Addendum)
[facility] Consultation     ASSESSMENT/PLAN   60 year old group home resident presents with pneumonia with septic shock. Severe hypernatremia with dehydration and AKI, now off vasopressors, with slow improvement. Recommend hospice referral  PULMONARY A:Acute respiratory failure secondary to pneumonia with sepsis - slowly improving, had NRB overnight P:   -broad-spectrum antibiotics for likely healthcare associated pneumonia. - Sputum cultures if possible. -Wean down oxygen as tolerated, will monitor patient's respiration status closely. Patient is DNR/DNI now -step down status -maintain sats >90% -CXR f\u - L lung collapse, new compared to CXR on 3/24, plan for Bipap today and tonight, recheck CXR in the AM.  Most likely mucus plugged is the reason for the this new finding.    CARDIOVASCULAR -Septic shock due to pneumonia, with hypotension-resolved off vasopressors   RENAL A:  Acute kidney injury. -Severe hypernatremia, likely secondary to dehydration. P:   -Continue IV rehydration with saline. -Nephrology following  -f/u BMP, Mg/Phos  INFECTIOUS A:  Pneumonia with sepsis. P:   Continue antibiotics.   BCx2 3/242: Pending UC 3/24: Pending Sputum-- Vancomycin 3/24>> Zosyn 3/20.>> MRSA and INF tests NEGATIVE    NEUROLOGIC A: Acute metabolic encephalopathy, likely due to septic shock as well as acute hypernatremia. -Continue treatment of underlying metabolic abnormalities.  Name: [name] MRN: [id number] DOB: 1956-04-14    ADMISSION DATE:  02/01/2016 CONSULTATION DATE:  02/01/16  REFERRING MD : Dr. Ether Griffins  CHIEF COMPLAINT:  Dyspnea.    SUBJECTIVE: Remains confused, but becoming more awake, able to wean of NRB this morning. One episode of fever overnight.  No Known Allergies  REVIEW OF SYSTEMS:   Cannot provide a review of systems due to altered mental status.   VITAL SIGNS: Temp:  [98.8 F (37.1 C)-101.1 F (38.4 C)]  99.5 F (37.5 C) (03/27 0800) Pulse Rate:  [31-133] 133 (03/27 0800) Resp:  [16-33] 24 (03/27 0800) BP: (75-126)/(46-90) 93/71 mmHg (03/27 0800) SpO2:  [87 %-100 %] 93 % (03/27 0830) FiO2 (%):  [100 %] 100 % (03/27 0830) Weight:  [152 lb 1.9 oz (69 kg)] 152 lb 1.9 oz (69 kg) (03/26 2300) HEMODYNAMICS:   VENTILATOR SETTINGS: Vent Mode:  [-]  FiO2 (%):  [100 %] 100 % INTAKE / OUTPUT:  Intake/Output Summary (Last 24 hours) at 02/04/16 0953 Last data filed at 02/04/16 0800  Gross per 24 hour  Intake  670.5 ml  Output   3951 ml  Net -3280.5 ml    Physical Examination:   VS: BP 93/71 mmHg  Pulse 133  Temp(Src) 99.5 F (37.5 C) (Oral)  Resp 24  Ht 5\' 7"  (1.702 m)  Wt 152 lb 1.9 oz (69 kg)  BMI 23.82 kg/m2  SpO2 93%  General Appearance:+distress  Neuro:without focal findings, mental status reduced. GCs8  HEENT: PERRLA, EOM intact, no ptosis, no other lesions noticed;  Pulmonary:shallow BS, dec on the left.  CardiovascularNormal S1,S2.  No m/r/g.    Abdomen: Benign, Soft, non-tender, No masses, hepatosplenomegaly, No lymphadenopathy Renal:  No costovertebral tenderness  GU:  Not performed at this time. Endoc: No evident thyromegaly, no signs of acromegaly. Skin:   warm, no rashes, no ecchymosis  Extremities: normal, no cyanosis, clubbing.   LABS: Reviewed   LABORATORY PANEL:   CBC  Recent Labs Lab 02/04/16 0531  WBC 12.0*  HGB 9.0*  HCT 28.5*  PLT 90*    Chemistries   Recent Labs Lab 02/01/16 0936  02/03/16 0610  02/04/16 0531  NA 172*  < >  152*  < > 151*  K 3.7  < > 3.8  < > 3.5  CL >130*  < > >130*  < > 128*  CO2 24  < > 19*  < > 18*  GLUCOSE 144*  < > 179*  < > 142*  BUN 79*  < > 70*  < > 50*  CREATININE 2.17*  < > 2.50*  < > 1.91*  CALCIUM 8.7*  < > 8.0*  < > 8.5*  MG  --   < > 2.4  --   --   PHOS  --   < > 5.1*  --   --   AST 56*  --   --   --   --   ALT 47  --   --   --   --   ALKPHOS 79  --   --   --   --   BILITOT 1.1  --   --   --    --   < > = values in this interval not displayed.   Recent Labs Lab 02/01/16 1552 02/03/16 2259  GLUCAP 125* 208*    Recent Labs Lab 02/01/16 1608  PHART 7.40  PCO2ART 34  PO2ART 62*    Recent Labs Lab 02/01/16 0936  AST 56*  ALT 47  ALKPHOS 79  BILITOT 1.1  ALBUMIN 3.5    Cardiac Enzymes  Recent Labs Lab 02/04/16 0531  TROPONINI 0.18*    RADIOLOGY:  Dg Chest Port 1 View  02/03/2016  CLINICAL DATA:  Acute onset of hypoxia.  Initial encounter. EXAM: PORTABLE CHEST 1 VIEW COMPARISON:  Chest radiograph from 02/01/2016 FINDINGS: The lungs are well-aerated. Bibasilar airspace opacities, left greater than right, raise concern for multifocal pneumonia, though superimposed pulmonary edema cannot be excluded. Small bilateral pleural effusions are seen. No pneumothorax is identified. The cardiomediastinal silhouette is the borderline normal in size. An AICD is noted overlying the left chest wall, with a single lead ending overlying the right ventricle. No acute osseous abnormalities are seen. IMPRESSION: Bibasilar airspace opacities, left greater than right, raise concern for multifocal pneumonia, though superimposed pulmonary edema cannot be excluded. Small bilateral pleural effusions seen. Electronically Signed   By: Garald Balding M.D.   On: 02/03/2016 23:00    I have personally obtained a history, examined the patient, evaluated laboratory and imaging results, formulated the assessment and plan and placed orders. CRITICAL CARE: The patient is critically ill with multiple organ systems failure and requires high complexity decision making for assessment and support, frequent evaluation and titration of therapies, application of advanced monitoring technologies and extensive interpretation of multiple databases.   Critical Care Time devoted to patient care services described in this note is 35 minutes.    Vilinda Boehringer, MD [facility] Pager [phone] (please enter 7-digits) On Call Pager - [phone] (please enter 7-digits)

## 2016-02-04 NOTE — Progress Notes (Signed)
Chaplain rounded the unit and provided a compassionate presence and support with silent prayer to the patient. [name] [phone]

## 2016-02-04 NOTE — Consult Note (Signed)
Pharmacy Antibiotic Note  [name] is a 60 y.o. male admitted on 02/01/2016 with pneumonia.  Pharmacy has been consulted for azithromycin and zosyn dosing (previously on vanc which was D/c'd due to negative MRSA PCR).  Plan: Zosyn 3.375g IV q8h (4 hour infusion). Continue azithromycin 500mg  IV q24hrs  Will address narrowing antibiotics with MD on rounds tomorrow (wanted one more day of broad spectrum today).  Height: 5\' 7"  (170.2 cm) Weight: 152 lb 1.9 oz (69 kg) IBW/kg (Calculated) : 66.1  Temp (24hrs), Avg:99.8 F (37.7 C), Min:99 F (37.2 C), Max:101.1 F (38.4 C)   Recent Labs Lab 02/01/16 0936  02/01/16 1239  02/01/16 1814 02/02/16 0251 02/03/16 0610 02/03/16 2214 02/03/16 2219 02/04/16 0531  WBC 5.4  --   --   --   --  6.9 9.7  --   --  12.0*  CREATININE 2.17*  < >  --   < > 2.49* 2.99* 2.50* 2.03*  --  1.91*  LATICACIDVEN 2.5*  --  1.5  --   --   --   --   --  1.3  --   < > = values in this interval not displayed.  Estimated Creatinine Clearance: 39.4 mL/min (by C-G formula based on Cr of 1.91).    No Known Allergies  Antimicrobials this admission: Zosyn 3/24 >>  Azithromycin 3/24>> Vancomycin 3/24 >> 3/26  Microbiology results: 3/25 BCx x2: NGTD 3/25 UCx: NGTD 3/24 MRSA PCR: (-)  Thank you for allowing pharmacy to be a part of this patient's care.  Vena Rua 02/04/2016 3:36 PM

## 2016-02-04 NOTE — Significant Event (Signed)
Rapid Response Event Note  Overview:  Called to room by supervisor.  Patient transferred from ICU to floor earlier today, now more lethargic.   Initial Focused Assessment:  Pt on 100% NRB mask, breathing 35-40 times per minute. O2 sats 99%.  Minimal response to sternal rub but withdraws from pain.  Lungs sound coarse and rhonchus.  Febrile 101 axillary.    Interventions:  Dr. Jannifer Franklin at bedside, stat orders for labs and chest xray.  Transfer to ICU.  Event Summary:  Called at 2200     Ended at 2210     Transferred to ICU     Farris Has D

## 2016-02-04 NOTE — Care Management (Addendum)
Order present for CM assessment for long term placement.  Patient presented from [facility] assisted living.  Contacted Herschell Dimes  from the [phone].  Patient has been resident for 3 months and was admitted  there from home environment.  On 3/24,  He was found unresponsive in the facility and sent to the ED.  Admitted with septic shock and admitted to icu on levophed.  He was transferred out of ICU 3/26 but transferred back to icu due to respiratory difficulty.  He is currently  DNR.  There is a palliative consult present for hospice care.  CSW referral made.Marland Kitchen

## 2016-02-05 ENCOUNTER — Inpatient Hospital Stay: Payer: Medicare Other

## 2016-02-05 ENCOUNTER — Inpatient Hospital Stay: Admit: 2016-02-05 | Payer: Medicare Other

## 2016-02-05 ENCOUNTER — Ambulatory Visit: Payer: Self-pay | Admitting: Cardiology

## 2016-02-05 ENCOUNTER — Inpatient Hospital Stay (HOSPITAL_COMMUNITY)
Admit: 2016-02-05 | Discharge: 2016-02-05 | Disposition: A | Discharge status: Home / Self Care | Payer: Medicare Other | Attending: Nephrology | Admitting: Nephrology

## 2016-02-05 DIAGNOSIS — J9620 Acute and chronic respiratory failure, unspecified whether with hypoxia or hypercapnia: Secondary | ICD-10-CM | POA: Insufficient documentation

## 2016-02-05 DIAGNOSIS — Z515 Encounter for palliative care: Secondary | ICD-10-CM

## 2016-02-05 DIAGNOSIS — J9811 Atelectasis: Secondary | ICD-10-CM

## 2016-02-05 DIAGNOSIS — E860 Dehydration: Secondary | ICD-10-CM

## 2016-02-05 DIAGNOSIS — F419 Anxiety disorder, unspecified: Secondary | ICD-10-CM

## 2016-02-05 DIAGNOSIS — E876 Hypokalemia: Secondary | ICD-10-CM

## 2016-02-05 DIAGNOSIS — T17500A Unspecified foreign body in bronchus causing asphyxiation, initial encounter: Secondary | ICD-10-CM | POA: Insufficient documentation

## 2016-02-05 DIAGNOSIS — Z66 Do not resuscitate: Secondary | ICD-10-CM

## 2016-02-05 DIAGNOSIS — I509 Heart failure, unspecified: Secondary | ICD-10-CM

## 2016-02-05 DIAGNOSIS — J9622 Acute and chronic respiratory failure with hypercapnia: Secondary | ICD-10-CM

## 2016-02-05 DIAGNOSIS — I959 Hypotension, unspecified: Secondary | ICD-10-CM

## 2016-02-05 DIAGNOSIS — J9809 Other diseases of bronchus, not elsewhere classified: Secondary | ICD-10-CM

## 2016-02-05 DIAGNOSIS — F17210 Nicotine dependence, cigarettes, uncomplicated: Secondary | ICD-10-CM

## 2016-02-05 DIAGNOSIS — J9600 Acute respiratory failure, unspecified whether with hypoxia or hypercapnia: Secondary | ICD-10-CM

## 2016-02-05 DIAGNOSIS — J9819 Other pulmonary collapse: Secondary | ICD-10-CM | POA: Insufficient documentation

## 2016-02-05 DIAGNOSIS — J9621 Acute and chronic respiratory failure with hypoxia: Secondary | ICD-10-CM

## 2016-02-05 LAB — CBC
HCT: 28.9 % — ABNORMAL LOW (ref 40.0–52.0)
HEMOGLOBIN: 9.6 g/dL — AB (ref 13.0–18.0)
MCH: 31.1 pg (ref 26.0–34.0)
MCHC: 33.2 g/dL (ref 32.0–36.0)
MCV: 93.6 fL (ref 80.0–100.0)
Platelets: 84 10*3/uL — ABNORMAL LOW (ref 150–440)
RBC: 3.09 MIL/uL — AB (ref 4.40–5.90)
RDW: 15.7 % — ABNORMAL HIGH (ref 11.5–14.5)
WBC: 7 10*3/uL (ref 3.8–10.6)

## 2016-02-05 LAB — BLOOD GAS, ARTERIAL
ALLENS TEST (PASS/FAIL): POSITIVE — AB
Acid-base deficit: 4 mmol/L — ABNORMAL HIGH (ref 0.0–2.0)
Bicarbonate: 21.6 mEq/L (ref 21.0–28.0)
FIO2: 0.4
O2 Saturation: 95 %
PEEP: 5 cmH2O
Patient temperature: 37
RATE: 16 resp/min
VT: 450 mL
pCO2 arterial: 41 mmHg (ref 32.0–48.0)
pH, Arterial: 7.33 — ABNORMAL LOW (ref 7.350–7.450)
pO2, Arterial: 81 mmHg — ABNORMAL LOW (ref 83.0–108.0)

## 2016-02-05 LAB — BASIC METABOLIC PANEL
BUN: 39 mg/dL — ABNORMAL HIGH (ref 6–20)
CO2: 20 mmol/L — AB (ref 22–32)
Calcium: 8.7 mg/dL — ABNORMAL LOW (ref 8.9–10.3)
Creatinine, Ser: 1.65 mg/dL — ABNORMAL HIGH (ref 0.61–1.24)
GFR calc non Af Amer: 44 mL/min — ABNORMAL LOW (ref >60)
GFR, EST AFRICAN AMERICAN: 51 mL/min — AB (ref >60)
Glucose, Bld: 142 mg/dL — ABNORMAL HIGH (ref 65–99)
POTASSIUM: 3.5 mmol/L (ref 3.5–5.1)
Sodium: 155 mmol/L — ABNORMAL HIGH (ref 135–145)

## 2016-02-05 LAB — GLUCOSE, CAPILLARY: Glucose-Capillary: 150 mg/dL — ABNORMAL HIGH (ref 65–99)

## 2016-02-05 LAB — ECHOCARDIOGRAM COMPLETE
Height: 67 in
WEIGHTICAEL: 2433.88 [oz_av]

## 2016-02-05 MED ORDER — VITAL HIGH PROTEIN PO LIQD
1000.0000 mL | ORAL | Status: DC
  Administered 2016-02-05: 23:00:00
  Administered 2016-02-05 – 2016-02-06 (×2): 1000 mL
  Administered 2016-02-06: 05:00:00

## 2016-02-05 MED ORDER — MIDAZOLAM HCL 2 MG/2ML IJ SOLN
2.0000 mg | Freq: Once | INTRAMUSCULAR | Status: AC
  Administered 2016-02-05: 2 mg via INTRAVENOUS

## 2016-02-05 MED ORDER — ROCURONIUM BROMIDE 50 MG/5ML IV SOLN
1.0000 mg/kg | Freq: Once | INTRAVENOUS | Status: AC
  Administered 2016-02-05: 50 mg via INTRAVENOUS

## 2016-02-05 MED ORDER — SODIUM CHLORIDE 0.9 % IV SOLN
200.0000 mg | Freq: Once | INTRAVENOUS | Status: AC
  Administered 2016-02-05: 200 mg via INTRAVENOUS
  Filled 2016-02-05: qty 200

## 2016-02-05 MED ORDER — FENTANYL CITRATE (PF) 100 MCG/2ML IJ SOLN
100.0000 ug | INTRAMUSCULAR | Status: DC | PRN
  Administered 2016-02-05 – 2016-02-06 (×4): 100 ug via INTRAVENOUS
  Filled 2016-02-05 (×2): qty 2

## 2016-02-05 MED ORDER — FREE WATER
100.0000 mL | Status: DC
Start: 2016-02-05 — End: 2016-02-06
  Administered 2016-02-05 – 2016-02-06 (×6): 100 mL

## 2016-02-05 MED ORDER — POTASSIUM CHLORIDE 20 MEQ PO PACK
20.0000 meq | PACK | Freq: Once | ORAL | Status: AC
  Administered 2016-02-05: 20 meq
  Filled 2016-02-05: qty 1

## 2016-02-05 MED ORDER — MIDAZOLAM HCL 2 MG/2ML IJ SOLN: 2.0000 mg | INTRAMUSCULAR | Status: DC | PRN

## 2016-02-05 MED ORDER — ANTISEPTIC ORAL RINSE SOLUTION (CORINZ)
7.0000 mL | Freq: Four times a day (QID) | OROMUCOSAL | Status: DC
  Administered 2016-02-06 – 2016-02-09 (×15): 7 mL via OROMUCOSAL
  Filled 2016-02-05 (×18): qty 7

## 2016-02-05 MED ORDER — FENTANYL CITRATE (PF) 100 MCG/2ML IJ SOLN
100.0000 ug | Freq: Once | INTRAMUSCULAR | Status: AC
  Administered 2016-02-05: 100 ug via INTRAVENOUS

## 2016-02-05 MED ORDER — SODIUM CHLORIDE 0.9 % IV SOLN
100.0000 mg | INTRAVENOUS | Status: DC
  Administered 2016-02-06: 100 mg via INTRAVENOUS
  Filled 2016-02-05 (×2): qty 100

## 2016-02-05 MED ORDER — HYDROCHLOROTHIAZIDE 50 MG PO TABS
50.0000 mg | ORAL_TABLET | Freq: Once | ORAL | Status: AC
  Administered 2016-02-05: 50 mg via ORAL
  Filled 2016-02-05: qty 1

## 2016-02-05 MED ORDER — CHLORHEXIDINE GLUCONATE 0.12% ORAL RINSE (MEDLINE KIT)
15.0000 mL | Freq: Two times a day (BID) | OROMUCOSAL | Status: DC
  Administered 2016-02-05 – 2016-02-09 (×8): 15 mL via OROMUCOSAL
  Filled 2016-02-05 (×10): qty 15

## 2016-02-05 MED ORDER — RISPERIDONE 1 MG PO TBDP
0.5000 mg | ORAL_TABLET | Freq: Two times a day (BID) | ORAL | Status: DC
  Administered 2016-02-06 – 2016-02-08 (×5): 0.5 mg via ORAL
  Filled 2016-02-05 (×5): qty 1

## 2016-02-05 MED ORDER — FENTANYL CITRATE (PF) 100 MCG/2ML IJ SOLN
100.0000 ug | INTRAMUSCULAR | Status: DC | PRN
  Filled 2016-02-05: qty 2

## 2016-02-05 NOTE — Progress Notes (Signed)
[facility]  ROUNDING NOTE   Subjective:   On bipap this morning.  Na 155 (150)   Objective:  Vital signs in last 24 hours:  Temp:  [98.1 F (36.7 C)-99.9 F (37.7 C)] 98.1 F (36.7 C) (03/28 0900) Pulse Rate:  [40-97] 68 (03/28 0900) Resp:  [22-37] 27 (03/28 0900) BP: (88-130)/(53-101) 114/75 mmHg (03/28 0900) SpO2:  [88 %-100 %] 100 % (03/28 0915) FiO2 (%):  [40 %] 40 % (03/28 0915)  Weight change:  Filed Weights   02/01/16 0913 02/02/16 0458 02/03/16 2300  Weight: 58.378 kg (128 lb 11.2 oz) 62.5 kg (137 lb 12.6 oz) 69 kg (152 lb 1.9 oz)    Intake/Output: I/O last 3 completed shifts: In: 1435.5 [I.V.:3; IV Piggyback:1432.5] Out: [id number]; Stool:1]   Intake/Output this shift:  Total I/O In: -  Out: 125 [Urine:125]  Physical Exam: General: Critically ill appearing  Head: Normocephalic, atraumatic. Dry oral mucosal membranes,   Eyes: Anicteric  Neck: Supple, trachea midline  Lungs:  Bilateral rhonchi BiPAP  Heart: tachycardia  Abdomen:  Soft, nontender, BS present   Extremities: no peripheral edema.  Neurologic: Awake, not following commands.  Skin: No lesions       Basic Metabolic Panel:  Recent Labs Lab 02/02/16 0251 02/03/16 0610 02/03/16 2214 02/04/16 0531 02/04/16 1006 02/04/16 1609 02/05/16 0643  NA 160* 152* 148* 151*  --  150* 155*  K 3.3* 3.8 2.7* 3.5  --  3.2* 3.5  CL >130* >130* 127* 128*  --  129* >130*  CO2 19* 19* 18* 18*  --  18* 20*  GLUCOSE 230* 179* 222* 142*  --  160* 142*  BUN 89* 70* 54* 50*  --  41* 39*  CREATININE 2.99* 2.50* 2.03* 1.91*  --  1.52* 1.65*  CALCIUM 7.9* 8.0* 8.1* 8.5*  --  8.3* 8.7*  MG 2.3 2.4  --   --  2.4 2.4  --   PHOS 1.5* 5.1*  --   --   --  2.6  --     Liver Function Tests:  Recent Labs Lab 02/01/16 0936  AST 56*  ALT 47  ALKPHOS 79  BILITOT 1.1  PROT 7.1  ALBUMIN 3.5   No results for input(s): LIPASE, AMYLASE in the last 168 hours. No results for input(s): AMMONIA  in the last 168 hours.  CBC:  Recent Labs Lab 02/01/16 0936 02/02/16 0251 02/03/16 0610 02/04/16 0531 02/05/16 0643  WBC 5.4 6.9 9.7 12.0* 7.0  NEUTROABS 4.5  --  8.2*  --   --   HGB 11.5* 9.8* 9.1* 9.0* 9.6*  HCT 35.2* 29.7* 27.4* 28.5* 28.9*  MCV 94.8 93.4 95.0 96.0 93.6  PLT 141* 112* 87* 90* 84*    Cardiac Enzymes:  Recent Labs Lab 02/01/16 0936 02/03/16 2214 02/04/16 0531 02/04/16 1006 02/04/16 1609  TROPONINI <0.03 0.10* 0.18* 0.09* 0.17*    BNP: Invalid input(s): POCBNP  CBG:  Recent Labs Lab 02/01/16 1552 02/03/16 2259  GLUCAP 125* 208*    Microbiology: Results for orders placed or performed during the hospital encounter of 02/01/16  Urine culture     Status: None   Collection Time: 02/01/16  9:36 AM  Result Value Ref Range Status   Specimen Description URINE, RANDOM  Final   Special Requests NONE  Final   Culture NO GROWTH 2 DAYS  Final   Report Status 02/03/2016 FINAL  Final  Culture, blood (routine x 2)     Status: None (Preliminary  result)   Collection Time: 02/01/16  9:36 AM  Result Value Ref Range Status   Specimen Description BLOOD LEFT HAND  Final   Special Requests BOTTLES DRAWN AEROBIC AND ANAEROBIC  1CC  Final   Culture NO GROWTH 3 DAYS  Final   Report Status PENDING  Incomplete  Culture, blood (routine x 2)     Status: None (Preliminary result)   Collection Time: 02/01/16 10:00 AM  Result Value Ref Range Status   Specimen Description BLOOD RIGHT HAND  Final   Special Requests BOTTLES DRAWN AEROBIC AND ANAEROBIC  1CC  Final   Culture NO GROWTH 3 DAYS  Final   Report Status PENDING  Incomplete  MRSA PCR Screening     Status: None   Collection Time: 02/01/16  5:00 PM  Result Value Ref Range Status   MRSA by PCR NEGATIVE NEGATIVE Final    Comment:        The GeneXpert MRSA Assay (FDA approved for NASAL specimens only), is one component of a comprehensive MRSA colonization surveillance program. It is not intended to diagnose  MRSA infection nor to guide or monitor treatment for MRSA infections.   Urine culture     Status: None   Collection Time: 02/02/16  5:56 PM  Result Value Ref Range Status   Specimen Description URINE, RANDOM  Final   Special Requests NONE  Final   Culture NO GROWTH 2 DAYS  Final   Report Status 02/04/2016 FINAL  Final  Culture, blood (Routine X 2) w Reflex to ID Panel     Status: None (Preliminary result)   Collection Time: 02/02/16  6:30 PM  Result Value Ref Range Status   Specimen Description BLOOD RIGHT HAND  Final   Special Requests   Final    BOTTLES DRAWN AEROBIC AND ANAEROBIC  AER 4CC ANA 10CC   Culture NO GROWTH 2 DAYS  Final   Report Status PENDING  Incomplete  Culture, blood (Routine X 2) w Reflex to ID Panel     Status: None (Preliminary result)   Collection Time: 02/02/16  6:44 PM  Result Value Ref Range Status   Specimen Description BLOOD BLOOD LEFT HAND  Final   Special Requests BOTTLES DRAWN AEROBIC AND ANAEROBIC 6CC  Final   Culture NO GROWTH 2 DAYS  Final   Report Status PENDING  Incomplete    Coagulation Studies: No results for input(s): LABPROT, INR in the last 72 hours.  Urinalysis:  Recent Labs  02/02/16 1756  COLORURINE YELLOW*  LABSPEC 1.011  PHURINE 5.0  GLUCOSEU NEGATIVE  HGBUR 3+*  BILIRUBINUR NEGATIVE  KETONESUR NEGATIVE  PROTEINUR NEGATIVE  NITRITE NEGATIVE  LEUKOCYTESUR NEGATIVE      Imaging: Dg Chest Port 1 View  02/04/2016  CLINICAL DATA:  Collapsed lung, sepsis, acute renal failure, former smoker EXAM: PORTABLE CHEST 1 VIEW COMPARISON:  Portable exam 1955 hours compared to 1017 hours FINDINGS: Complete opacification of the LEFT hemithorax with mediastinal shift to the LEFT compatible with LEFT lung atelectasis. This is new since 02/03/2016 exam when patient demonstrated bibasilar consolidation. Abrupt termination of the air column within the LEFT mainstem bronchus cannot exclude mucous plugging Hyperexpanded RIGHT lung is clear. No  pneumothorax. Bones unremarkable. IMPRESSION: Complete opacification of the LEFT hemi thorax with associated volume loss likely representing a combination of LEFT lung atelectasis and underlying consolidation seen on an earlier study of 02/03/2016. Unable to exclude mucous plugging/endobronchial obstruction of the LEFT mainstem bronchus ; followup until resolution recommended. Electronically Signed  By: Lavonia Dana M.D.   On: 02/04/2016 20:26   Dg Chest Port 1 View  02/04/2016  CLINICAL DATA:  Found unresponsive at group home. Respiratory distress, pneumonia. EXAM: PORTABLE CHEST 1 VIEW COMPARISON:  02/03/2016 and 02/01/2016. FINDINGS: 1017 hours. Two views were obtained. There is new complete opacification of the left hemithorax with volume loss and mediastinal shift to the left. Patchy right basilar airspace disease is unchanged. There is no evidence of pneumothorax or significant pleural effusion on the right. Left subclavian AICD lead appears unchanged. No acute osseous findings are seen. IMPRESSION: Complete left lung collapse, likely due to progressive pneumonia as correlated with recent prior studies. Underlying central neoplasm cannot be excluded, and continued follow-up warranted. Electronically Signed   By: Richardean Sale M.D.   On: 02/04/2016 10:55   Dg Chest Port 1 View  02/03/2016  CLINICAL DATA:  Acute onset of hypoxia.  Initial encounter. EXAM: PORTABLE CHEST 1 VIEW COMPARISON:  Chest radiograph from 02/01/2016 FINDINGS: The lungs are well-aerated. Bibasilar airspace opacities, left greater than right, raise concern for multifocal pneumonia, though superimposed pulmonary edema cannot be excluded. Small bilateral pleural effusions are seen. No pneumothorax is identified. The cardiomediastinal silhouette is the borderline normal in size. An AICD is noted overlying the left chest wall, with a single lead ending overlying the right ventricle. No acute osseous abnormalities are seen. IMPRESSION:  Bibasilar airspace opacities, left greater than right, raise concern for multifocal pneumonia, though superimposed pulmonary edema cannot be excluded. Small bilateral pleural effusions seen. Electronically Signed   By: Garald Balding M.D.   On: 02/03/2016 23:00     Medications:     . aspirin  300 mg Rectal Daily  . azithromycin  500 mg Intravenous Q24H  . hydrochlorothiazide  50 mg Oral Once  . ipratropium-albuterol  3 mL Nebulization Q6H  . levETIRAcetam  750 mg Intravenous Q12H  . LORazepam  0.25 mg Intravenous Q12H  . pantoprazole (PROTONIX) IV  40 mg Intravenous Q24H  . piperacillin-tazobactam (ZOSYN)  IV  3.375 g Intravenous 3 times per day  . risperiDONE  1 mg Oral BID  . sodium chloride flush  3 mL Intravenous Q12H   acetaminophen, LORazepam, morphine injection, ondansetron **OR** ondansetron (ZOFRAN) IV  Assessment/ Plan:  60 y.o. black male with no significant past medical history, who was admitted to [facility] on 02/01/2016 for evaluation of altered mental status. He was found have severe metabolic derangements upon admission. He was found to have a right middle lobe pneumonia.   1. Acute renal failure secondary to acute tubular necrosis, in the setting of pneumonia. Creatinine worse, nonoliguric urine output. No acute indication for dialysis. Renally dose all medications.  No known baseline creatinine.   2. Hypernatremia: Free water deficit of 4.4 litres. Now with pulmonary edema with D5W gtt. Currently off IV fluids.  - Unable to get feeding tube for free water flushes. - trial of PO hydrochlorothiazide today. Will get pharmacy to order IV hydrochlorothiazide.   3. Metabolic Acidosis: CO2 20. With hypokalemia.  - hydrochlorothiazide will help   4. Right lower lobe bacterial pneumonia. Febrile overnight. Patient remains on azithromycin, Zosyn, and vancomycin.  5. Anemia with acute renal failure: hemoglobin 9.6 - has not received epo.     LOS: [name],  Alpha 3/28/20179:19 AM

## 2016-02-05 NOTE — Progress Notes (Signed)
Pts daughter called back and said her and her mother would be here this AM.

## 2016-02-05 NOTE — Progress Notes (Signed)
Pt became increasingly tachypneic. Breathing seems labored with accessory muscle use and slightly more asymetrical. Breath sounds remain diminished on the left side. E-Link notified, suggested that I contact the daughter in order to start discussing comfort care.

## 2016-02-05 NOTE — Consult Note (Signed)
Pharmacy Antibiotic Note  [name] is a 60 y.o. male admitted on 02/01/2016 with pneumonia.  Pharmacy has been consulted for eraxis and zosyn dosing (previously on azithro and vanc which was D/c'd due to negative MRSA PCR).  Anidulafungin (eraxis) ordered after patient was intubated and found to have extensive white fluid throughout lungs, suspicious for fungal infection.  Plan: Continue zosyn 3.375 gm EIV q8hrs  Will initiate anidulafungin (eraxis) 200mg  IV x1 and then 100mg  IV q24hrs thereafter.  Height: 5\' 7"  (170.2 cm) Weight: 152 lb 1.9 oz (69 kg) IBW/kg (Calculated) : 66.1  Temp (24hrs), Avg:98.6 F (37 C), Min:97.2 F (36.2 C), Max:99.9 F (37.7 C)   Recent Labs Lab 02/01/16 0936  02/01/16 1239  02/02/16 0251 02/03/16 0610 02/03/16 2214 02/03/16 2219 02/04/16 0531 02/04/16 1609 02/05/16 0643  WBC 5.4  --   --   --  6.9 9.7  --   --  12.0*  --  7.0  CREATININE 2.17*  < >  --   < > 2.99* 2.50* 2.03*  --  1.91* 1.52* 1.65*  LATICACIDVEN 2.5*  --  1.5  --   --   --   --  1.3  --   --   --   < > = values in this interval not displayed.  Estimated Creatinine Clearance: 45.6 mL/min (by C-G formula based on Cr of 1.65).    No Known Allergies  Antimicrobials this admission: Zosyn 3/24 >>  Anidulafungin (Eraxis) 3/28>> Azithromycin 3/24>>3/28 Vancomycin 3/24 >> 3/26  Microbiology results: 3/25 BCx x2: NGTD 3/25 UCx: NGTD 3/24 MRSA PCR: (-)  Thank you for allowing pharmacy to be a part of this patient's care.  Vena Rua 02/05/2016 3:15 PM

## 2016-02-05 NOTE — Progress Notes (Signed)
[place], [place].   02/05/2016  Patient: [name]   Date of Birth:  08-23-1956  Date of admission:  02/01/2016  Date of Discharge  02/05/2016    To Whom it May Concern:   [name] is under our care. His wife Ms. [name] is here in the hospital on March 28 to accompany him  If you have any questions or concerns, please don't hesitate to call.  Sincerely,   Nicholes Mango M.D Pager [phone] Office : [phone]   .

## 2016-02-05 NOTE — Progress Notes (Signed)
eLink Physician-Brief Progress Note Patient Name: [name] DOB: 01-16-1956 MRN: [id number]   Date of Service  02/05/2016  HPI/Events of Note  Called by bedside RN with tachypnea.  eICU Interventions  Patient is DNR and complete whiteout on the left that is known and seen by PCCM during the day.  Elected to rightfully not do anything about given code status and functional status.  Will not give narcotics right now, need to discuss comfort care.  As of now, DNR not comfort care but needs comfort care discussion.     Intervention Category Major Interventions: Other:  Tannya Gonet 02/05/2016, 5:13 AM

## 2016-02-05 NOTE — Progress Notes (Signed)
[place], [place].   02/05/2016  Patient: [name]   Date of Birth:  11/26/1955  Date of admission:  02/01/2016  Date of Discharge     To Whom it May Concern:   Dodger Shy is under our care. His daughter Domenic Moras is here in the hospital on March 28 to accompany him   If you have any questions or concerns, please don't hesitate to call.  Sincerely,   Nicholes Mango M.D Pager [phone] Office : [phone]   .

## 2016-02-05 NOTE — Consult Note (Signed)
[facility] Consultation     ASSESSMENT/PLAN   60 year old group home resident presents with pneumonia with septic shock. Severe hypernatremia with dehydration and AKI, now off vasopressors, left lung collapse, intubated on 02/05/2016, bronchoscopy with large mucous plug removed from left mainstem.  PULMONARY A:Acute respiratory failure secondary to pneumonia with sepsis - slowly improving Left lung collapse Mucus plugging Status post bronchoscopy on 02/05/2016 Ventilator-dependent respiratory failure 02/04/2016 ?Fungal infection of upper airways (see bronch report).  P:   -Continue mechanical ventilation, wean as tolerated -maintain sats >90% - as Needed nebulizers - SBT/WUA daily - ABG as needed -I have discussed intubation and left lung collapse with family, they have agreed with intubation and will reevaluate goals of care in 3 or 4 days if unable to wean off ventilator - Follow-up BAL cultures (micro, viral, fungal, afb) - continue with Eraxis for suspected pulmonary fungal infection, follow up fungal BAL cultures.    CARDIOVASCULAR -Septic shock due to pneumonia, with hypotension-resolved off vasopressors   RENAL A:  Acute kidney injury. -Severe hypernatremia, likely secondary to dehydration. Na=155 today P:   -Continue IV rehydration with saline. -Nephrology following  -f/u BMP, Mg/Phos -Now intubated and has an NG tube, will try free water flushes for hyponatremia -Got a dose of HCTZ today  INFECTIOUS A:  Pneumonia with sepsis. P:   Continue antibiotics.   BCx2 3/242: Pending UC 3/24: Pending Sputum-- Vancomycin 3/24>> Zosyn 3/20.>> MRSA and INF tests NEGATIVE BAL 3/28>>    NEUROLOGIC A: Acute metabolic encephalopathy, likely due to septic shock as well as acute hypernatremia. -Continue treatment of underlying metabolic abnormalities.  Name: [name] MRN: [id number] DOB: 03/08/1956    ADMISSION DATE:   02/01/2016 CONSULTATION DATE:  02/01/16  REFERRING MD : Dr. Ether Griffins  CHIEF COMPLAINT:  Dyspnea.    SUBJECTIVE: Remains confused, spoke with family at bedside, they have decided on intubation along with bronchoscopy for suspected mucus plugging of left lobe causing worsening clinical deterioration..  No Known Allergies  REVIEW OF SYSTEMS:   Cannot provide a review of systems due to altered mental status.   VITAL SIGNS: Temp:  [98.1 F (36.7 C)-99.9 F (37.7 C)] 98.1 F (36.7 C) (03/28 0900) Pulse Rate:  [40-97] 68 (03/28 0900) Resp:  [22-37] 27 (03/28 0900) BP: (88-130)/(53-101) 114/75 mmHg (03/28 0900) SpO2:  [88 %-100 %] 100 % (03/28 0915) FiO2 (%):  [40 %] 40 % (03/28 0915) HEMODYNAMICS:   VENTILATOR SETTINGS: Vent Mode:  [-]  FiO2 (%):  [40 %] 40 % INTAKE / OUTPUT:  Intake/Output Summary (Last 24 hours) at 02/05/16 0927 Last data filed at 02/05/16 0800  Gross per 24 hour  Intake    768 ml  Output   2361 ml  Net  -1593 ml    Physical Examination:   VS: BP 114/75 mmHg  Pulse 68  Temp(Src) 98.1 F (36.7 C) (Core (Comment))  Resp 27  Ht 5\' 7"  (1.702 m)  Wt 152 lb 1.9 oz (69 kg)  BMI 23.82 kg/m2  SpO2 100%  General Appearance:+distress  Neuro:without focal findings, mental status reduced. GCs8  HEENT: PERRLA, EOM intact, no ptosis, no other lesions noticed;  Pulmonary:shallow BS, dec on the left.  CardiovascularNormal S1,S2.  No m/r/g.    Abdomen: Benign, Soft, non-tender, No masses, hepatosplenomegaly, No lymphadenopathy Renal:  No costovertebral tenderness  GU:  Not performed at this time. Endoc: No evident thyromegaly, no signs of acromegaly. Skin:   warm, no rashes, no ecchymosis  Extremities: normal,  no cyanosis, clubbing.   LABS: Reviewed   LABORATORY PANEL:   CBC  Recent Labs Lab 02/05/16 0643  WBC 7.0  HGB 9.6*  HCT 28.9*  PLT 84*    Chemistries   Recent Labs Lab 02/01/16 0936  02/04/16 1609 02/05/16 0643  NA 172*  < > 150*  155*  K 3.7  < > 3.2* 3.5  CL >130*  < > 129* >130*  CO2 24  < > 18* 20*  GLUCOSE 144*  < > 160* 142*  BUN 79*  < > 41* 39*  CREATININE 2.17*  < > 1.52* 1.65*  CALCIUM 8.7*  < > 8.3* 8.7*  MG  --   < > 2.4  --   PHOS  --   < > 2.6  --   AST 56*  --   --   --   ALT 47  --   --   --   ALKPHOS 79  --   --   --   BILITOT 1.1  --   --   --   < > = values in this interval not displayed.   Recent Labs Lab 02/01/16 1552 02/03/16 2259  GLUCAP 125* 208*    Recent Labs Lab 02/01/16 1608  PHART 7.40  PCO2ART 34  PO2ART 62*    Recent Labs Lab 02/01/16 0936  AST 56*  ALT 47  ALKPHOS 79  BILITOT 1.1  ALBUMIN 3.5    Cardiac Enzymes  Recent Labs Lab 02/04/16 1609  TROPONINI 0.17*    RADIOLOGY:  Dg Chest Port 1 View  02/04/2016  CLINICAL DATA:  Collapsed lung, sepsis, acute renal failure, former smoker EXAM: PORTABLE CHEST 1 VIEW COMPARISON:  Portable exam 1955 hours compared to 1017 hours FINDINGS: Complete opacification of the LEFT hemithorax with mediastinal shift to the LEFT compatible with LEFT lung atelectasis. This is new since 02/03/2016 exam when patient demonstrated bibasilar consolidation. Abrupt termination of the air column within the LEFT mainstem bronchus cannot exclude mucous plugging Hyperexpanded RIGHT lung is clear. No pneumothorax. Bones unremarkable. IMPRESSION: Complete opacification of the LEFT hemi thorax with associated volume loss likely representing a combination of LEFT lung atelectasis and underlying consolidation seen on an earlier study of 02/03/2016. Unable to exclude mucous plugging/endobronchial obstruction of the LEFT mainstem bronchus ; followup until resolution recommended. Electronically Signed   By: Lavonia Dana M.D.   On: 02/04/2016 20:26   Dg Chest Port 1 View  02/04/2016  CLINICAL DATA:  Found unresponsive at group home. Respiratory distress, pneumonia. EXAM: PORTABLE CHEST 1 VIEW COMPARISON:  02/03/2016 and 02/01/2016. FINDINGS: 1017  hours. Two views were obtained. There is new complete opacification of the left hemithorax with volume loss and mediastinal shift to the left. Patchy right basilar airspace disease is unchanged. There is no evidence of pneumothorax or significant pleural effusion on the right. Left subclavian AICD lead appears unchanged. No acute osseous findings are seen. IMPRESSION: Complete left lung collapse, likely due to progressive pneumonia as correlated with recent prior studies. Underlying central neoplasm cannot be excluded, and continued follow-up warranted. Electronically Signed   By: Richardean Sale M.D.   On: 02/04/2016 10:55   Dg Chest Port 1 View  02/03/2016  CLINICAL DATA:  Acute onset of hypoxia.  Initial encounter. EXAM: PORTABLE CHEST 1 VIEW COMPARISON:  Chest radiograph from 02/01/2016 FINDINGS: The lungs are well-aerated. Bibasilar airspace opacities, left greater than right, raise concern for multifocal pneumonia, though superimposed pulmonary edema cannot be excluded. Small bilateral  pleural effusions are seen. No pneumothorax is identified. The cardiomediastinal silhouette is the borderline normal in size. An AICD is noted overlying the left chest wall, with a single lead ending overlying the right ventricle. No acute osseous abnormalities are seen. IMPRESSION: Bibasilar airspace opacities, left greater than right, raise concern for multifocal pneumonia, though superimposed pulmonary edema cannot be excluded. Small bilateral pleural effusions seen. Electronically Signed   By: Garald Balding M.D.   On: 02/03/2016 23:00    I have personally obtained a history, examined the patient, evaluated laboratory and imaging results, formulated the assessment and plan and placed orders. CRITICAL CARE: The patient is critically ill with multiple organ systems failure and requires high complexity decision making for assessment and support, frequent evaluation and titration of therapies, application of advanced  monitoring technologies and extensive interpretation of multiple databases.   Critical Care Time devoted to patient care services described in this note is 45 minutes.    Vilinda Boehringer, MD [facility] Pager [phone] (please enter 7-digits) On Call Pager - [phone] (please enter 7-digits)

## 2016-02-05 NOTE — Progress Notes (Signed)
Speech Therapy Note: reviewed chart notes; consulted NSG/MD. Pt is being orally intubated this morning sec. To declined respiratory status(lung collapse noted per chart notes). Diet order d/c'd; ST will f/u w/ pt's status when extubated and appropriate. MD agreed.

## 2016-02-05 NOTE — Progress Notes (Signed)
Brief Nutrition Note  Consult received for enteral/tube feeding initiation and management.  Adult Enteral Nutrition Protocol initiated. Full assessment to follow.  Admitting Dx: Hypernatremia [E87.0] ARF (acute renal failure) (HCC) [N17.9] Right lower lobe pneumonia [J18.9] Sepsis, due to unspecified organism ([facility]) [A41.9]  Body mass index is 23.82 kg/(m^2). BMI WNL.   Labs:   Recent Labs Lab 02/02/16 0251 02/03/16 0610  02/04/16 0531 02/04/16 1006 02/04/16 1609 02/05/16 0643  NA 160* 152*  < > 151*  --  150* 155*  K 3.3* 3.8  < > 3.5  --  3.2* 3.5  CL >130* >130*  < > 128*  --  129* >130*  CO2 19* 19*  < > 18*  --  18* 20*  BUN 89* 70*  < > 50*  --  41* 39*  CREATININE 2.99* 2.50*  < > 1.91*  --  1.52* 1.65*  CALCIUM 7.9* 8.0*  < > 8.5*  --  8.3* 8.7*  MG 2.3 2.4  --   --  2.4 2.4  --   PHOS 1.5* 5.1*  --   --   --  2.6  --   GLUCOSE 230* 179*  < > 142*  --  160* 142*  < > = values in this interval not displayed.  [place], [place], [place] Pager [phone] After Hours Pager

## 2016-02-05 NOTE — Progress Notes (Signed)
Update: Family at Bedside - daughter, mother, sister, son-in-law  Update family on current clinical status. L lung collapse with mediastinal shift - probable mucus plug Tried non invasive measures - chest physiotherapy, percussion, bipap- unable to re-expand lung.  After discussion with family, they have agreed to the following plan 1. Intubation  2. Bronchoscopy  3. If unable to wean of the vent after 3-4 days, then will readdress goal of care 4. Keep DNR (even on mechanical ventilation).   RN Witness  - Johney Maine, MD [facility] Pager [phone] (please enter 7-digits) On Call Pager [phone] (please enter 7-digits)

## 2016-02-05 NOTE — Plan of Care (Signed)
Problem: Fluid and Electrolyte Imbalance Goal: Ability to maintain adequate oxygenation and ventilation will improve Outcome: Progressing ABG good after bronchoscopy. On 40% / rate 16/ TV 450/ 5peep  Problem: Safety: Goal: Ability to remain free from injury will improve Outcome: Progressing No injury  Problem: Health Behavior/Discharge Planning: Goal: Ability to manage health-related needs will improve Outcome: Progressing Daughter and wife met with Dr Stevenson Clinch.  Decision made to intubate and do bronchoscopy.  Problem: Pain Managment: Goal: General experience of comfort will improve Outcome: Progressing Fentanyl prn for pain  Problem: Physical Regulation: Goal: Ability to maintain clinical measurements within normal limits will improve Outcome: Progressing Lung sounds and CXR improved after bronchoscopy. Copius  tan and yellow thick tenacous secretions removed and sent for testing. Goal: Will remain free from infection Outcome: Progressing Temp increasing to 100.7 now.  Cultures pending. Eraxis added.  Zosyn continues  Problem: Skin Integrity: Goal: Risk for impaired skin integrity will decrease Outcome: Progressing Lower extremities unchanged.  Heels floated  Problem: Activity: Goal: Risk for activity intolerance will decrease Outcome: Not Progressing Remains critically ill on bedrest.  Problem: Fluid Volume: Goal: Ability to maintain a balanced intake and output will improve Outcome: Progressing UOP good. Water flushes added for elevated sodium.  Problem: Nutrition: Goal: Adequate nutrition will be maintained Outcome: Progressing TF started at 1700.  Problem: Bowel/Gastric: Goal: Will not experience complications related to bowel motility Outcome: Progressing Large loose brown stool x1 today

## 2016-02-05 NOTE — Consult Note (Addendum)
MEDICATION RELATED CONSULT NOTE - FOLLOW UP  Pharmacy Consult for electrolyte management Indication: hypokalemia, hypomagnesemia  No Known Allergies  Patient Measurements: Height: 5\' 7"  (170.2 cm) Weight: 152 lb 1.9 oz (69 kg) IBW/kg (Calculated) : 66.1  Vital Signs: Temp: 98.2 F (36.8 C) (03/28 1500) Temp Source: Core (Comment) (03/28 0400) BP: 102/69 mmHg (03/28 1500) Pulse Rate: 108 (03/28 1500) Intake/Output from previous day: 03/27 0701 - 03/28 0700 In: 868 [I.V.:3; IV Piggyback:865] Out: 2236 [Urine:2235; Stool:1] Intake/Output from this shift: Total I/O In: 310 [IV Piggyback:310] Out: 751 [Urine:750; Stool:1]  Assessment: [name] is a 60yo male admitted for PNA. Pharmacy consulted to manage electrolytes in this patient. 3/27 Mag 2.4, Phos 2.6  3/28 K 3.5  Plan:  Patient was intubated today, starting tube feeds. Will give KCl 51mEq packet PT x1.   Will recheck with AM labs.   Vena Rua 02/05/2016,3:20 PM

## 2016-02-05 NOTE — Progress Notes (Signed)
Was able to get in touch with daughter the second try. I gave an update about her dad, and explained that his condition had declined. I suggested that she try to come in today to meet with the physician to discuss next steps in care. She stated, "I will call my mom and get back with you." I inquired whether or not her mother, the pts wife, was the legal decision maker and she stated, "they are still legally married, but it's better if I relay the information to her because she has a difficult time understanding."

## 2016-02-05 NOTE — Procedures (Signed)
Procedure Note:  Orotracheal Intubation  Implied consent due to emergent nature of patient's condition. Correct Patient, Name & ID confirmed.  The patient was pre-oxygenated and then, under direct visualization, a 7.5 mm cuffed endotracheal tube was placed through the vocal cords into the trachea, using the Glidescope.  Total attempts made 2, using Glidescope, initially tried 8.77mm ETT but unable to successfully intubate due to very anterior vocal cords.  During intubation an assistant applied gentle pressure to the cricoid cartilage.  Position confirmed by auscultation of lungs (good breath sounds bilaterally) and no stomach sounds.  Tube secured at 25 cm. Pulse ox 98 %. CO2 detector in place with appropriate color change.   ETT position verified via FOB.   Pt tolerated procedure well.  No complications were noted.   CXR ordered.   Vilinda Boehringer, MD [facility] Pager [phone] On Call Pager [phone]

## 2016-02-05 NOTE — Progress Notes (Addendum)
Was contacted by Dr. Corinna Lines from [facility], noted pt with white out of lung. Explained to Dr. Corinna Lines that if patient undergoes bronchoscopy,  at this time will likely require intubation due to sedation required for bronchoscopy, as pt apperas to be on bipap already.  Asked Dr. Corinna Lines if pt is in respiratory distress requiring intubation. He felt that the patient is not in respiratory distress at this time.  Asked Dr. Corinna Lines to call me should the patient develop respiratory distress and I would subsequently intubate the patient at that time, and bronchoscopy could also be completed at that time if indicated.

## 2016-02-05 NOTE — Procedures (Signed)
Date: 02/05/2016, @TIME     PHYSICIAN:  Fox Salminen  Indications/Preliminary Diagnosis:   Consent: (Place X beside choice/s below)  The benefits, risks and possible complications of the procedure were        explained to:  ___ patient  __x_ patient's family  ___ other:___________  who verbalized understanding and gave:  ___ verbal  ___ written  __x_ verbal and written  ___ telephone  ___ other:________ consent.      Unable to obtain consent; procedure performed on emergent basis.     Other:       PRESEDATION ASSESSMENT: History and Physical has been performed. Patient meds and allergies have been reviewed. Presedation airway examination has been performed and documented. Baseline vital signs, sedation score, oxygenation status, and cardiac rhythm were reviewed. Patient was deemed to be in satisfactory condition to undergo the procedure.  PREMEDICATIONS:   Sedative/Narcotic Amt Dose   Versed  mg   Fentanyl  mcg  Diprivan  mg        Airway Prep (Place X beside choice below)   1% Transtracheal Lidocaine Anesthetization 7 cc   Patient prepped per Bronchoscopy Lab Policy       Insertion Route (Place X beside choice below)   Nasal   Oral  x Endotracheal Tube   Tracheostomy   INTRAPROCEDURE MEDICATIONS:  Sedative/Narcotic Amt Dose   Versed  mg   Fentanyl  mcg  Diprivan  mg       Medication Amt Dose  Medication Amt Dose  Xylocaine 2%  cc  Epinephrine 1:10,000 sol  cc  Xylocaine 4%  cc  Cocaine  cc   TECHNICAL PROCEDURES: (Place X beside choice below)   Procedures  Description    None     Electrocautery     Cryotherapy     Balloon Dilatation     Bronchography     Stent Placement   x  Therapeutic Aspiration LLL    Laser/Argon Plasma    Brachytherapy Catheter Placement    Foreign Body Removal     SPECIMENS (Sites): (Place X beside choice below)  Specimens Description   No Specimens Obtained     Washings   x Lavage LLL   Biopsies    Fine Needle Aspirates     Brushings    Sputum    FINDINGS: see below  ESTIMATED BLOOD LOSS: none  COMPLICATIONS/RESOLUTION: none  PROCEDURE DETAILS: Timeout performed and correct patient, name, & ID confirmed. Following prep per Pulmonary policy, appropriate sedation was administered.  Airway exam proceeded with findings, technical procedures, and specimen collection as noted below. At the end of exam the scope was withdrawn without incident. Impression and Plan as noted below.   Inspection: Trachea with thick white material that here to the tracheal wall extending down to the right and left mainstem bronchus, some of this material was easily suctioned, while the majority of it was not. The majority of the right airways with moderate erythema, no endobronchial lesions in the right upper lobe, right middle lobe, there was a small mucous plug in the anterior aspect of the right lower lobe. There was significant mucus plugging of the left mainstem bronchus, this was suction using therapeutic bronchoscope, after removal of mucous plug there was noted to be significant mucus plugging in the left lower lobe, it was flushed with 3 rounds of 10 mL of saline and successfully suction/aspirated. After complete therapeutic aspiration of the left mainstem and left lower lobe bronchus, further inspection did not show any  endobronchial lesions. Left mainstem bronchus with white thick adherent material, the rest of the left airways with moderate erythema. Procedure: BAL of left lower lobe Therapeutic aspiration of left mainstem bronchus mucous plug See above  IMPLANTED DEVICE(S): None  IMPRESSION:POST-PROCEDURE DX: Left mainstem mucous plug, successfully aspirated. Possible fungal infection of trachea and left and right mainstem bronchus.  RECOMMENDATION/PLAN:  Consider antifungal for possible fungal infection of upper airways Follow-up the BAL of left lower lobe   Procedure time: 35 mins  Larry Boehringer, MD [facility] Pager : [phone] (Please enter 7 digits) On call pager [phone] (please enter 7-digits) Clinic - [phone]

## 2016-02-05 NOTE — Consult Note (Signed)
Palliative Medicine Inpatient Consult Note   Name: [name] Date: 02/05/2016 MRN: [id number]  DOB: [birth year]  Referring Physician: Nicholes Mango, MD  Palliative Care consult requested for this 60 y.o. male for goals of medical therapy in patient with pneumonia.  HISTORY: [name] is a resident of a [facility] which is equivalent to assisted living).  He has been there for three months and came from home. He was admitted in septic shock from right lower lobe pneumonia. He was hypotensive and was reated with fluids and levophed, and was transferred to the floor but then back to the ICU due to worsening respiratory failure. He also had severe hypernatremia with dehydration and acute kidney injury. Vancomycin and Zosyn were started. Pt has been encephalopathic.    Pt is married.  His wife told Dr Ashby Dawes that she would not want intubation or CPR but pressors would be OK.  Sodium improved with fluids and urine output and creatinine improved by 3/26.  However, after doing reasonably well on 1 LPM O2 he worsened and was placed on NRB mask.  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  His glucose has run high and his hgb is mildly low at around 9. He had idioventricular rhythm on 3/26 at 11/29 and calcium gluconate was given and pt was moved back to the ICU. Metabolic acidosis has been improving recently.  Unknown baseline creatinine.  Pt had thoracentesis on 3/27 of right side.   Chest films began to show complete collapse of left lung and this was thought to be related to mucous plugging.  BIPAP was started. Lovenox was DCd due to dropping platelets.  Dr. Stevenson Clinch spoke with pt's family members and they agreed with intubation and bronchoscopy on 3/28. This was done and pt also had mucous plugging suctioned out.  There remained white thick  adherent material in left mainstem bronchus but the mucous had been aspirated.   I have discussed pt's current status with his attending and with Dr. Stevenson Clinch.  I feel it best that I follow at a distance for a few days since Dr. Stevenson Clinch has already obtained an agreement from family that they will re-assess goals of care in a few days if there is little progress.  Pts CXR has already improved after mucous plugging was suctioned out during bronchoscopy.   I do think it would be helpful to try to get more past medical hx about pt from family.  He apparently has a pacemaker.  Also, it would be good to know why he is on Risperdal twice a day.  In fact, I have reduced this to half dose since he was so very lethargic since admission (per notes).  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     IMPRESSION: Acute Respiratory Failure due to pneumonia Right Middle lobe HCAP (pt in family care home) Septic Shock due to pneumonia with sepsis ---with hypothermia Acute renal failure due to sepsis and dehydration with ATN Thrombocytopenia Metabolic acidosis due to sepsis Hypokalemia Left lung collapse due to mucous plugging. Hypomagnesemia Hypokalemia     REVIEW OF SYSTEMS:  Patient is not able to provide ROS due to being intubated  SPIRITUAL SUPPORT SYSTEM: Yes -family  SOCIAL HISTORY:  reports  that he has been smoking Cigarettes.  He has a 30 pack-year smoking history. He has never used smokeless tobacco. He reports that he does not drink alcohol or use illicit drugs. Pt is married and has a daughter. Wife had asked for DNR status on 3/24.  Daughter said he can have dialysis if needed as a trial on 3/24. Daughter is Location manager.  She has stated that pt's wife 'has a hard time processing the information' and that 'they are still legally married though'. She has stated that it is best to  talk to her and then she will relay info to pt';s wife.   LEGAL DOCUMENTS:  I placed a completed portable DNR form in the paper chart.   CODE STATUS: DNR (even while intubated)  PAST MEDICAL HISTORY: Past Medical History  Diagnosis Date  . Discontinued smoking     PAST SURGICAL HISTORY: History reviewed. No pertinent past surgical history.  ALLERGIES:  has No Known Allergies.  MEDICATIONS:  Current Facility-Administered Medications  Medication Dose Route Frequency Provider Last Rate Last Dose  . acetaminophen (TYLENOL) suppository 650 mg  650 mg Rectal Q6H PRN Mikael Spray, NP   650 mg at 02/02/16 1752  . anidulafungin (ERAXIS) 200 mg in sodium chloride 0.9 % 200 mL IVPB  200 mg Intravenous Once Vena Rua, RPH   200 mg at 02/05/16 1217   Followed by  . [START ON 02/06/2016] anidulafungin (ERAXIS) 100 mg in sodium chloride 0.9 % 100 mL IVPB  100 mg Intravenous Q24H Vena Rua, RPH      . aspirin suppository 300 mg  300 mg Rectal Daily Theodoro Grist, MD   300 mg at 02/05/16 0929  . fentaNYL (SUBLIMAZE) injection 100 mcg  100 mcg Intravenous Q15 min PRN Vishal Mungal, MD      . fentaNYL (SUBLIMAZE) injection 100 mcg  100 mcg Intravenous Q2H PRN Vishal Mungal, MD   100 mcg at 02/05/16 1214  . free water 100 mL  100 mL Per Tube 6 times per day Lavonia Dana, MD   100 mL at 02/05/16 1600  . ipratropium-albuterol (DUONEB) 0.5-2.5 (3) MG/3ML nebulizer solution 3 mL  3 mL Nebulization Q6H Flora Lipps, MD   3 mL at 02/05/16 1435  . levETIRAcetam (KEPPRA) 750 mg in sodium chloride 0.9 % 100 mL IVPB  750 mg Intravenous Q12H Theodoro Grist, MD   750 mg at 02/05/16 1345  . LORazepam (ATIVAN) injection 0.25 mg  0.25 mg Intravenous Q12H Theodoro Grist, MD   Stopped at 02/04/16 2200  . LORazepam (ATIVAN) injection 0.5 mg  0.5 mg Intravenous Q6H PRN Theodoro Grist, MD      . midazolam (VERSED) injection 2 mg  2 mg Intravenous Q15 min PRN Vishal Mungal, MD      . midazolam (VERSED)  injection 2 mg  2 mg Intravenous Q2H PRN Vishal Mungal, MD      . morphine 2 MG/ML injection 2 mg  2 mg Intravenous Q4H PRN Lance Coon, MD   2 mg at 02/05/16 0529  . ondansetron (ZOFRAN) tablet 4 mg  4 mg Oral Q6H PRN Theodoro Grist, MD       Or  . ondansetron (ZOFRAN) injection 4 mg  4 mg Intravenous Q6H PRN Theodoro Grist, MD      . pantoprazole (PROTONIX) injection 40 mg  40 mg Intravenous Q24H Laverle Hobby, MD   40 mg at 02/05/16 1452  . piperacillin-tazobactam (ZOSYN) IVPB 3.375 g  3.375 g Intravenous 3  times per day Theodoro Grist, MD   3.375 g at 02/05/16 1451  . risperiDONE (RISPERDAL M-TABS) disintegrating tablet 1 mg  1 mg Oral BID Theodoro Grist, MD   1 mg at 02/05/16 0929  . sodium chloride flush (NS) 0.9 % injection 3 mL  3 mL Intravenous Q12H Theodoro Grist, MD   3 mL at 02/05/16 1100    Vital Signs: BP 102/69 mmHg  Pulse 108  Temp(Src) 98.2 F (36.8 C) (Core (Comment))  Resp 22  Ht 5\' 7"  (1.702 m)  Wt 69 kg (152 lb 1.9 oz)  BMI 23.82 kg/m2  SpO2 100% Filed Weights   02/01/16 0913 02/02/16 0458 02/03/16 2300  Weight: 58.378 kg (128 lb 11.2 oz) 62.5 kg (137 lb 12.6 oz) 69 kg (152 lb 1.9 oz)    Estimated body mass index is 23.82 kg/(m^2) as calculated from the following:   Height as of this encounter: 5\' 7"  (1.702 m).   Weight as of this encounter: 69 kg (152 lb 1.9 oz).  PERFORMANCE STATUS (ECOG) : 4 - Bedbound  PHYSICAL EXAM: Sedated and intubated Eyes closed Neck w/o JVD or TM hrt rrr no m Lungs with some ronchi Abd soft and NT Ext no mottling or cyanosis  LABS: CBC:    Component Value Date/Time   WBC 7.0 02/05/2016 0643   HGB 9.6* 02/05/2016 0643   HCT 28.9* 02/05/2016 0643   PLT 84* 02/05/2016 0643   MCV 93.6 02/05/2016 0643   NEUTROABS 8.2* 02/03/2016 0610   LYMPHSABS 1.1 02/03/2016 0610   MONOABS 0.4 02/03/2016 0610   EOSABS 0.0 02/03/2016 0610   BASOSABS 0.0 02/03/2016 0610   Comprehensive Metabolic Panel:    Component Value Date/Time    NA 155* 02/05/2016 0643   K 3.5 02/05/2016 0643   CL >130* 02/05/2016 0643   CO2 20* 02/05/2016 0643   BUN 39* 02/05/2016 0643   CREATININE 1.65* 02/05/2016 0643   GLUCOSE 142* 02/05/2016 0643   CALCIUM 8.7* 02/05/2016 0643   AST 56* 02/01/2016 0936   ALT 47 02/01/2016 0936   ALKPHOS 79 02/01/2016 0936   BILITOT 1.1 02/01/2016 0936   PROT 7.1 02/01/2016 0936   ALBUMIN 3.5 02/01/2016 0936    More than 50% of the visit was spent in counseling/coordination of care: Yes  Time Spent: 80 minutes

## 2016-02-05 NOTE — Progress Notes (Signed)
[facility] [place] NAME: [name]    MR#:  [id number]  DATE OF BIRTH:  05-02-1956  SUBJECTIVE:  CHIEF COMPLAINT:  Patient is lethargic. On BiPAP   REVIEW OF SYSTEMS:  Review of systems unobtainable as patient is still lethargic  DRUG ALLERGIES:  No Known Allergies  VITALS:  Blood pressure 97/76, pulse 101, temperature 97.2 F (36.2 C), temperature source Core (Comment), resp. rate 24, height 5\' 7"  (1.702 m), weight 69 kg (152 lb 1.9 oz), SpO2 100 %.  PHYSICAL EXAMINATION:  GENERAL:  60 y.o.-year-old patient lying in the bed with no acute distress.  EYES: Pupils equal, round, reactive to light and accommodation. No scleral icterus. Extraocular muscles intact.  HEENT: Head atraumatic, normocephalic. Oropharynx and nasopharynx clear.  NECK:  Supple, no jugular venous distention. No thyroid enlargement, no tenderness.  LUNGS: Moderate air entry on rt with decreased breath sounds on the left lung fields, wheezing, positive rales,rhonchi or crepitation. No use of accessory muscles of respiration.  CARDIOVASCULAR: S1, S2 normal. No murmurs, rubs, or gallops.  ABDOMEN: Soft, nontender, nondistended. Bowel sounds present. No organomegaly or mass.  EXTREMITIES: No pedal edema, cyanosis, or clubbing.  NEUROLOGIC: Altered Psych-altered mental status   CBC  Recent Labs Lab 02/05/16 0643  WBC 7.0  HGB 9.6*  HCT 28.9*  PLT 84*   ------------------------------------------------------------------------------------------------------------------  Chemistries   Recent Labs Lab 02/01/16 0936  02/04/16 1609 02/05/16 0643  NA 172*  < > 150* 155*  K 3.7  < > 3.2* 3.5  CL >130*  < > 129* >130*  CO2 24  < > 18* 20*  GLUCOSE 144*  < > 160* 142*  BUN 79*  < > 41* 39*  CREATININE 2.17*  < > 1.52* 1.65*  CALCIUM 8.7*  < > 8.3* 8.7*  MG  --   < > 2.4  --   AST 56*  --   --   --   ALT 47  --   --   --   ALKPHOS 79  --   --   --    BILITOT 1.1  --   --   --   < > = values in this interval not displayed. ------------------------------------------------------------------------------------------------------------------  Cardiac Enzymes  Recent Labs Lab 02/04/16 1609  TROPONINI 0.17*   ------------------------------------------------------------------------------------------------------------------  RADIOLOGY:  Dg Abd 1 View  02/05/2016  CLINICAL DATA:  Status post NG tube placement EXAM: ABDOMEN - 1 VIEW COMPARISON:  02/04/2016 FINDINGS: The nasogastric tube tip is in the projection of the left upper quadrant of the abdomen. The side port is well below the expected location of the GE junction. Cholecystectomy clips are identified in the right upper quadrant. There is gaseous distension of the large and small bowel loops. IMPRESSION: 1. Nasogastric tube is in the projection of the left upper quadrant of the abdomen. 2. Gaseous distension of the bowel loops. Electronically Signed   By: Kerby Moors M.D.   On: 02/05/2016 12:22   Dg Chest Port 1 View  02/05/2016  CLINICAL DATA:  Endotracheal tube placement. EXAM: PORTABLE CHEST 1 VIEW COMPARISON:  02/04/2016 FINDINGS: The endotracheal the it is 3.5 cm above the carina. The NGT and is in the stomach. The pacer wires are stable. Interval re-expansion of the left lung with some post expansion edema. Persistent left basilar atelectasis and probable small left effusion. The right lung remains clear. IMPRESSION: The endotracheal tube is in good position, 3.5 cm above the  carina. The NG tube is in good position in the stomach. Re-aeration/ Re-expansion of the left lung. Some residual left lower lobe atelectasis and small left effusion. Electronically Signed   By: Marijo Sanes M.D.   On: 02/05/2016 12:23   Dg Chest Port 1 View  02/04/2016  CLINICAL DATA:  Collapsed lung, sepsis, acute renal failure, former smoker EXAM: PORTABLE CHEST 1 VIEW COMPARISON:  Portable exam 1955 hours  compared to 1017 hours FINDINGS: Complete opacification of the LEFT hemithorax with mediastinal shift to the LEFT compatible with LEFT lung atelectasis. This is new since 02/03/2016 exam when patient demonstrated bibasilar consolidation. Abrupt termination of the air column within the LEFT mainstem bronchus cannot exclude mucous plugging Hyperexpanded RIGHT lung is clear. No pneumothorax. Bones unremarkable. IMPRESSION: Complete opacification of the LEFT hemi thorax with associated volume loss likely representing a combination of LEFT lung atelectasis and underlying consolidation seen on an earlier study of 02/03/2016. Unable to exclude mucous plugging/endobronchial obstruction of the LEFT mainstem bronchus ; followup until resolution recommended. Electronically Signed   By: Lavonia Dana M.D.   On: 02/04/2016 20:26   Dg Chest Port 1 View  02/04/2016  CLINICAL DATA:  Found unresponsive at group home. Respiratory distress, pneumonia. EXAM: PORTABLE CHEST 1 VIEW COMPARISON:  02/03/2016 and 02/01/2016. FINDINGS: 1017 hours. Two views were obtained. There is new complete opacification of the left hemithorax with volume loss and mediastinal shift to the left. Patchy right basilar airspace disease is unchanged. There is no evidence of pneumothorax or significant pleural effusion on the right. Left subclavian AICD lead appears unchanged. No acute osseous findings are seen. IMPRESSION: Complete left lung collapse, likely due to progressive pneumonia as correlated with recent prior studies. Underlying central neoplasm cannot be excluded, and continued follow-up warranted. Electronically Signed   By: Richardean Sale M.D.   On: 02/04/2016 10:55   Dg Chest Port 1 View  02/03/2016  CLINICAL DATA:  Acute onset of hypoxia.  Initial encounter. EXAM: PORTABLE CHEST 1 VIEW COMPARISON:  Chest radiograph from 02/01/2016 FINDINGS: The lungs are well-aerated. Bibasilar airspace opacities, left greater than right, raise concern for  multifocal pneumonia, though superimposed pulmonary edema cannot be excluded. Small bilateral pleural effusions are seen. No pneumothorax is identified. The cardiomediastinal silhouette is the borderline normal in size. An AICD is noted overlying the left chest wall, with a single lead ending overlying the right ventricle. No acute osseous abnormalities are seen. IMPRESSION: Bibasilar airspace opacities, left greater than right, raise concern for multifocal pneumonia, though superimposed pulmonary edema cannot be excluded. Small bilateral pleural effusions seen. Electronically Signed   By: Garald Balding M.D.   On: 02/03/2016 23:00    EKG:   Orders placed or performed during the hospital encounter of 02/01/16  . EKG 12-Lead  . EKG 12-Lead  . EKG 12-Lead  . EKG 12-Lead  . EKG 12-Lead  . EKG 12-Lead  . EKG 12-Lead  . EKG 12-Lead  . ED EKG  . ED EKG  . ED EKG  . ED EKG  . EKG 12-Lead  . EKG 12-Lead    ASSESSMENT AND PLAN:   #1. Acute respiratory failure secondary to pneumonia with sepsis Chest x-ray has revealed left lung collapse which is most likely from mucous plugging Patient is intubated and bronchoscopies done today reexpansion of the left lung after discussing with the patient's family Appreciate pulmonology recommendations  continue patient on broad-spectrum antibiotic therapy, get sputum cultures if possible, adjust antibiotics depending on culture results patient  on broad-spectrum antibiotic therapy, including  Zosyn and Zithromax. discontinued vancomycin as MRSA screen is negative Continue IV fluids     #3. Hypernatremia,  sodium trended down from 165-160 --152 and 155 and hyperchloremia continue patient on  D5W and monitor , check sodium level Appreciate nephrology recommendations. Will consider IV hydrochlorothiazide if no improvement  #4. Metabolic encephalopathy, secondary to sepsis and acute renal failure  neuro checks frequently, supportive care. Getting urinary  drug screen  #5. Acute renal failure, separate acute TUBular necrosis   Foley catheter is in, and output 3.4 L yesterday, kidney function closely Renal ultrasound is normal   appreciate nephrology recommendations   #6. thrombocytopenia, l no active bleeding, monitor platelet counts closely [id number] 7 84,000- DC Lovenox SCDs. CBC a.m.  #7 failure to thrive  Palliative care consult is placed  Currently patient is DO NOT RESUSCITATE  Family is considering alternat options if there is no improvement in patient's situation in next 2 days  Discussed with Dr. Juleen China   All the records are reviewed and case discussed with Care Management/Social Workerr. Management plans discussed with the patient, family and they are in agreement.  CODE STATUS: DNR  TOTAL CRITICAL CARE TIME TAKING CARE OF THIS PATIENT: 35 minutes.   POSSIBLE D/C IN ? DAYS, DEPENDING ON CLINICAL CONDITION.   Nicholes Mango M.D on 02/05/2016 at 1:43 PM  Between 7am to 6pm - Pager - [phone] After 6pm go to [email or web address] - password [email or web address] [facility]  Office  [phone]  CC: Primary care physician; Vista Mink, FNP

## 2016-02-06 ENCOUNTER — Inpatient Hospital Stay: Payer: Medicare Other

## 2016-02-06 LAB — BASIC METABOLIC PANEL
ANION GAP: UNDETERMINED (ref 5–15)
BUN: 39 mg/dL — AB (ref 6–20)
CALCIUM: 8.4 mg/dL — AB (ref 8.9–10.3)
CO2: 20 mmol/L — ABNORMAL LOW (ref 22–32)
Chloride: >130 mmol/L (ref 101–111)
Creatinine, Ser: 1.55 mg/dL — ABNORMAL HIGH (ref 0.61–1.24)
GFR calc Af Amer: 55 mL/min — ABNORMAL LOW (ref >60)
GFR, EST NON AFRICAN AMERICAN: 48 mL/min — AB (ref >60)
GLUCOSE: 210 mg/dL — AB (ref 65–99)
Potassium: 3.1 mmol/L — ABNORMAL LOW (ref 3.5–5.1)
SODIUM: 155 mmol/L — AB (ref 135–145)

## 2016-02-06 LAB — CBC
HCT: 26.6 % — ABNORMAL LOW (ref 40.0–52.0)
Hemoglobin: 8.9 g/dL — ABNORMAL LOW (ref 13.0–18.0)
MCH: 32 pg (ref 26.0–34.0)
MCHC: 33.6 g/dL (ref 32.0–36.0)
MCV: 95.3 fL (ref 80.0–100.0)
PLATELETS: 92 10*3/uL — AB (ref 150–440)
RBC: 2.79 MIL/uL — AB (ref 4.40–5.90)
RDW: 15.9 % — AB (ref 11.5–14.5)
WBC: 4.5 10*3/uL (ref 3.8–10.6)

## 2016-02-06 LAB — CULTURE, BLOOD (ROUTINE X 2)
CULTURE: NO GROWTH
Culture: NO GROWTH

## 2016-02-06 LAB — SODIUM
SODIUM: 157 mmol/L — AB (ref 135–145)
Sodium: 157 mmol/L — ABNORMAL HIGH (ref 135–145)
Sodium: 159 mmol/L — ABNORMAL HIGH (ref 135–145)

## 2016-02-06 LAB — GLUCOSE, CAPILLARY
GLUCOSE-CAPILLARY: 197 mg/dL — AB (ref 65–99)
GLUCOSE-CAPILLARY: 205 mg/dL — AB (ref 65–99)
GLUCOSE-CAPILLARY: 169 mg/dL — AB (ref 65–99)
GLUCOSE-CAPILLARY: 224 mg/dL — AB (ref 65–99)
Glucose-Capillary: 166 mg/dL — ABNORMAL HIGH (ref 65–99)
Glucose-Capillary: 190 mg/dL — ABNORMAL HIGH (ref 65–99)

## 2016-02-06 LAB — MAGNESIUM: MAGNESIUM: 2.7 mg/dL — AB (ref 1.7–2.4)

## 2016-02-06 MED ORDER — FREE WATER
200.0000 mL | Status: DC
  Administered 2016-02-06 (×2): 200 mL

## 2016-02-06 MED ORDER — INSULIN ASPART 100 UNIT/ML ~~LOC~~ SOLN
0.0000 [IU] | Freq: Four times a day (QID) | SUBCUTANEOUS | Status: DC
  Administered 2016-02-06: 2 [IU] via SUBCUTANEOUS
  Administered 2016-02-06 – 2016-02-07 (×3): 3 [IU] via SUBCUTANEOUS
  Administered 2016-02-07: 2 [IU] via SUBCUTANEOUS
  Administered 2016-02-07 – 2016-02-08 (×3): 3 [IU] via SUBCUTANEOUS
  Administered 2016-02-08: 2 [IU] via SUBCUTANEOUS
  Administered 2016-02-08: 3 [IU] via SUBCUTANEOUS
  Filled 2016-02-06: qty 3
  Filled 2016-02-06 (×3): qty 2
  Filled 2016-02-06 (×6): qty 3

## 2016-02-06 MED ORDER — VITAL HIGH PROTEIN PO LIQD
1000.0000 mL | ORAL | Status: DC
  Administered 2016-02-07: 1000 mL

## 2016-02-06 MED ORDER — INSULIN ASPART 100 UNIT/ML ~~LOC~~ SOLN: 0.0000 [IU] | Freq: Three times a day (TID) | SUBCUTANEOUS | Status: DC

## 2016-02-06 MED ORDER — DEXTROSE 5 % IV SOLN
INTRAVENOUS | Status: DC
  Administered 2016-02-06 – 2016-02-10 (×4): via INTRAVENOUS

## 2016-02-06 MED ORDER — POTASSIUM CHLORIDE 20 MEQ PO PACK
40.0000 meq | PACK | Freq: Two times a day (BID) | ORAL | Status: AC
  Administered 2016-02-06 (×2): 40 meq
  Filled 2016-02-06 (×2): qty 2

## 2016-02-06 MED ORDER — SODIUM CHLORIDE 0.45 % IV SOLN
INTRAVENOUS | Status: DC
  Administered 2016-02-06: 07:00:00 via INTRAVENOUS

## 2016-02-06 MED ORDER — INSULIN ASPART 100 UNIT/ML ~~LOC~~ SOLN
0.0000 [IU] | Freq: Every day | SUBCUTANEOUS | Status: DC
  Administered 2016-02-06: 2 [IU] via SUBCUTANEOUS
  Filled 2016-02-06: qty 2

## 2016-02-06 MED ORDER — ASPIRIN 81 MG PO CHEW
324.0000 mg | CHEWABLE_TABLET | Freq: Every day | ORAL | Status: DC
  Administered 2016-02-07 – 2016-02-08 (×2): 324 mg
  Filled 2016-02-06 (×2): qty 4

## 2016-02-06 MED ORDER — FREE WATER
250.0000 mL | Status: DC
  Administered 2016-02-06 – 2016-02-07 (×4): 250 mL

## 2016-02-06 NOTE — Progress Notes (Signed)
[facility] [place] NAME: [name]    MR#:  [id number]  DATE OF BIRTH:  1955/11/28  SUBJECTIVE:  CHIEF COMPLAINT:  Patient is intubated. Had bronchoscopy yesterday   REVIEW OF SYSTEMS:  Review of systems unobtainable as patient is still lethargic  DRUG ALLERGIES:  No Known Allergies  VITALS:  Blood pressure 92/60, pulse 90, temperature 99.9 F (37.7 C), temperature source Core (Comment), resp. rate 18, height 5\' 7"  (1.702 m), weight 60.7 kg (133 lb 13.1 oz), SpO2 98 %.  PHYSICAL EXAMINATION:  GENERAL:  59 y.o.-year-old patient lying in the bed with no acute distress.  EYES: Pupils equal, round, reactive to light and accommodation. No scleral icterus.  HEENT: Head atraumatic, normocephalic. Oropharynx-ET tube intact NECK:  Supple, no jugular venous distention. No thyroid enlargement, no tenderness.  LUNGS: Moderate air entry bilaterally, no wheezing,  rales,rhonchi or crepitation. No use of accessory muscles of respiration.  CARDIOVASCULAR: S1, S2 normal. No murmurs, rubs, or gallops.  ABDOMEN: Soft, nontender, nondistended. Bowel sounds present. No organomegaly or mass.  EXTREMITIES: No pedal edema, cyanosis, or clubbing.  NEUROLOGIC: Altered Psych-altered mental status   CBC  Recent Labs Lab 02/06/16 0538  WBC 4.5  HGB 8.9*  HCT 26.6*  PLT 92*   ------------------------------------------------------------------------------------------------------------------  Chemistries   Recent Labs Lab 02/01/16 0936  02/06/16 0538 02/06/16 1000  NA 172*  < > 155* 157*  K 3.7  < > 3.1*  --   CL >130*  < > >130*  --   CO2 24  < > 20*  --   GLUCOSE 144*  < > 210*  --   BUN 79*  < > 39*  --   CREATININE 2.17*  < > 1.55*  --   CALCIUM 8.7*  < > 8.4*  --   MG  --   < > 2.7*  --   AST 56*  --   --   --   ALT 47  --   --   --   ALKPHOS 79  --   --   --   BILITOT 1.1  --   --   --   < > = values in this interval not  displayed. ------------------------------------------------------------------------------------------------------------------  Cardiac Enzymes  Recent Labs Lab 02/04/16 1609  TROPONINI 0.17*   ------------------------------------------------------------------------------------------------------------------  RADIOLOGY:  Dg Abd 1 View  02/05/2016  CLINICAL DATA:  Status post NG tube placement EXAM: ABDOMEN - 1 VIEW COMPARISON:  02/04/2016 FINDINGS: The nasogastric tube tip is in the projection of the left upper quadrant of the abdomen. The side port is well below the expected location of the GE junction. Cholecystectomy clips are identified in the right upper quadrant. There is gaseous distension of the large and small bowel loops. IMPRESSION: 1. Nasogastric tube is in the projection of the left upper quadrant of the abdomen. 2. Gaseous distension of the bowel loops. Electronically Signed   By: Kerby Moors M.D.   On: 02/05/2016 12:22   Portable Chest Xray  02/06/2016  CLINICAL DATA:  Respiratory failure. EXAM: PORTABLE CHEST 1 VIEW COMPARISON:  02/05/2016 FINDINGS: Endotracheal tube is 3.5 cm above the carina. Nasogastric tube tip in the gastric fundus. Persistent opacity in the retrocardiac space with probable left pleural fluid. Right lung is clear. Heart size is within normal limits and stable. 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 IMPRESSION: Stable densities at the left lung base. Findings are suggestive for consolidation and pleural fluid.  Minimal change from the prior examination. Support apparatuses are appropriately positioned. Electronically Signed   By: Markus Daft M.D.   On: 02/06/2016 07:22   Dg Chest Port 1 View  02/05/2016  CLINICAL DATA:  Endotracheal tube placement. EXAM: PORTABLE CHEST 1 VIEW COMPARISON:  02/04/2016 FINDINGS: The endotracheal the it is 3.5 cm above the carina. The NGT and is in the stomach. The pacer wires are stable. Interval re-expansion of the left lung with  some post expansion edema. Persistent left basilar atelectasis and probable small left effusion. The right lung remains clear. IMPRESSION: The endotracheal tube is in good position, 3.5 cm above the carina. The NG tube is in good position in the stomach. Re-aeration/ Re-expansion of the left lung. Some residual left lower lobe atelectasis and small left effusion. Electronically Signed   By: Marijo Sanes M.D.   On: 02/05/2016 12:23   Dg Chest Port 1 View  02/04/2016  CLINICAL DATA:  Collapsed lung, sepsis, acute renal failure, former smoker EXAM: PORTABLE CHEST 1 VIEW COMPARISON:  Portable exam 1955 hours compared to 1017 hours FINDINGS: Complete opacification of the LEFT hemithorax with mediastinal shift to the LEFT compatible with LEFT lung atelectasis. This is new since 02/03/2016 exam when patient demonstrated bibasilar consolidation. Abrupt termination of the air column within the LEFT mainstem bronchus cannot exclude mucous plugging Hyperexpanded RIGHT lung is clear. No pneumothorax. Bones unremarkable. IMPRESSION: Complete opacification of the LEFT hemi thorax with associated volume loss likely representing a combination of LEFT lung atelectasis and underlying consolidation seen on an earlier study of 02/03/2016. Unable to exclude mucous plugging/endobronchial obstruction of the LEFT mainstem bronchus ; followup until resolution recommended. Electronically Signed   By: Lavonia Dana M.D.   On: 02/04/2016 20:26    EKG:   Orders placed or performed during the hospital encounter of 02/01/16  . EKG 12-Lead  . EKG 12-Lead  . EKG 12-Lead  . EKG 12-Lead  . EKG 12-Lead  . EKG 12-Lead  . EKG 12-Lead  . EKG 12-Lead  . ED EKG  . ED EKG  . ED EKG  . ED EKG  . EKG 12-Lead  . EKG 12-Lead    ASSESSMENT AND PLAN:   #1. Acute respiratory failure secondary to pneumonia with sepsis Chest x-ray has revealed left lung collapse which is most likely from mucous plugging status post bronchoscopy Patient is  intubated and bronchoscopies done 02/05/2016- reexpansion of the left lung noticed Appreciate pulmonology recommendations  continue patient on broad-spectrum antibiotic therapy, get sputum cultures if possible, adjust antibiotics depending on culture results patient on broad-spectrum antibiotic therapy, including  Zosyn and Zithromax. discontinued vancomycin as MRSA screen is negative Continue IV fluids     #3. Hypernatremia,  sodium trended down from 165-160 --152 and 155 and hyperchloremia continue patient on  D5W and monitor , free water flushes 200 mL every 4 hours check sodium level Appreciate nephrology recommendations. Will consider IV hydrochlorothiazide if no improvement  #4. Metabolic encephalopathy, secondary to sepsis and acute renal failure  neuro checks frequently, supportive care. Getting urinary drug screen  #5. Acute renal failure, separate acute TUBular necrosis   Foley catheter is in, monitor intake and output closely Renal ultrasound is normal   appreciate nephrology recommendations   #6. thrombocytopenia, l no active bleeding, monitor platelet counts closely 144-112-8 7 84,000-92,000 DC Lovenox SCDs. CBC a.m.  #7 failure to thrive  Palliative care consult is pending Currently patient is DO NOT RESUSCITATE  Family is considering alternat options  if there is no improvement in patient's situation in next 2 days  Discussed with Dr. Juleen China, Dr. Stevenson Clinch   All the records are reviewed and case discussed with Care Management/Social Workerr. Management plans discussed with the patient, family and they are in agreement.  CODE STATUS: DNR  TOTAL CRITICAL CARE TIME TAKING CARE OF THIS PATIENT: 35 minutes.   POSSIBLE D/C IN ? DAYS, DEPENDING ON CLINICAL CONDITION.   Nicholes Mango M.D on 02/06/2016 at 3:48 PM  Between 7am to 6pm - Pager - [phone] After 6pm go to [email or web address] - password [email or web address] [facility]  Office   [phone]  CC: Primary care physician; Vista Mink, FNP

## 2016-02-06 NOTE — Progress Notes (Signed)
Palliative Care Update  This note is simply a reminder that I am following pt at a distance while it is being determined if he can be safely weaned from the ventilator within 3-4 days.  Dr Stevenson Clinch had discussed this plan with family.  If I am needed due to failure of pt to improve, then I will be glad to assist in talking with family about comfort care options.    Colleen Can, MD

## 2016-02-06 NOTE — Care Management (Signed)
Barrier to discharge- intubated and on ventilator.

## 2016-02-06 NOTE — Consult Note (Signed)
MEDICATION RELATED CONSULT NOTE - FOLLOW UP  Pharmacy Consult for electrolyte management Indication: hypokalemia, hypomagnesemia  No Known Allergies  Patient Measurements: Height: 5\' 7"  (170.2 cm) Weight: 133 lb 13.1 oz (60.7 kg) IBW/kg (Calculated) : 66.1  Vital Signs: Temp: 99.3 F (37.4 C) (03/29 0900) Temp Source: Core (Comment) (03/29 0400) BP: 92/70 mmHg (03/29 0900) Pulse Rate: 97 (03/29 0900) Intake/Output from previous day: 03/28 0701 - 03/29 0700 In: 1026 [I.V.:78; NG/GT:430.5; IV Piggyback:517.5] Out: 2176 [Urine:2175; Stool:1] Intake/Output from this shift: Total I/O In: 305 [I.V.:125; NG/GT:180] Out: -   Assessment: [name] is a 60yo male admitted for PNA. Pharmacy consulted to manage electrolytes in this patient. 3/29 K 3.1  Plan:  Patient was intubated today, starting tube feeds. Will give KCl 66mEq packet PT x2.   Will recheck with AM labs.   Vena Rua 02/06/2016,9:42 AM

## 2016-02-06 NOTE — Progress Notes (Signed)
Dr. Juleen China present and RN made him aware of most recent sodium result of 159.  MD gave order to increase d5W to 100cc/H and increase free water flushes to 250cc q4H.

## 2016-02-06 NOTE — Consult Note (Signed)
Cross Medicine Consultation     ASSESSMENT/PLAN   60 year old group home resident presents with pneumonia with septic shock. Severe hypernatremia with dehydration and AKI, now off vasopressors, left lung collapse, intubated on 02/05/2016, bronchoscopy with large mucous plug removed from left mainstem.  PULMONARY A:Acute respiratory failure secondary to pneumonia with sepsis - slowly improving Left lung collapse - intubated Mucus plugging - resolved Status post bronchoscopy on 02/05/2016 Ventilator-dependent respiratory failure 02/04/2016 ?Fungal infection of upper airways (see bronch report).  P:   -Continue mechanical ventilation, wean as tolerated.  Possible extubation in next 1-2 days. Patient with hypernatremia and adjust IVFs and free H2O flushes.  -maintain sats >90% - as Needed nebulizers - SBT/WUA daily - ABG as needed -I have discussed intubation and left lung collapse with family, they have agreed with intubation and will reevaluate goals of care in 3 or 4 days if unable to wean off ventilator - Follow-up BAL cultures (micro, viral, fungal, afb) - continue with Eraxis for suspected pulmonary fungal infection, follow up fungal BAL cultures.    CARDIOVASCULAR -Septic shock due to pneumonia, with hypotension-resolved, off vasopressors   RENAL A:  Acute kidney injury. -Severe hypernatremia, likely secondary to dehydration. Na=157 today P:   -started on D5W@75cc , Free H2O flushes increased to 200 Q4hrs -Nephrology following  -f/u BMP, Mg/Phos  INFECTIOUS A:  Pneumonia with sepsis. P:   Continue antibiotics.   BCx2 3/242: Pending UC 3/24: Pending Sputum-- Vancomycin 3/24>> Zosyn 3/20.>> MRSA and INF tests NEGATIVE BAL 3/28>>    NEUROLOGIC A: Acute metabolic encephalopathy, likely due to septic shock as well as acute hypernatremia. -Continue treatment of underlying metabolic abnormalities.  Name: [name] MRN: [id number] DOB: August 16, 1956    ADMISSION DATE:  02/01/2016 CONSULTATION DATE:  02/01/16  REFERRING MD : Dr. Ether Griffins  CHIEF COMPLAINT:  Dyspnea.    SUBJECTIVE: Tolerated WUA well today, however, given hypernatremia and volume status, not ready to come off vent yet. No Known Allergies  REVIEW OF SYSTEMS:   Cannot provide a review of systems due to intubated.    VITAL SIGNS: Temp:  [97.2 F (36.2 C)-100.8 F (38.2 C)] 99.3 F (37.4 C) (03/29 0900) Pulse Rate:  [27-111] 97 (03/29 0900) Resp:  [14-49] 18 (03/29 0900) BP: (79-123)/(51-77) 92/70 mmHg (03/29 0900) SpO2:  [93 %-100 %] 100 % (03/29 0900) FiO2 (%):  [28 %-40 %] 28 % (03/29 0828) Weight:  [133 lb 13.1 oz (60.7 kg)] 133 lb 13.1 oz (60.7 kg) (03/29 0500) HEMODYNAMICS:   VENTILATOR SETTINGS: Vent Mode:  [-] PSV;CPAP FiO2 (%):  [28 %-40 %] 28 % Set Rate:  [16 bmp] 16 bmp Vt Set:  [450 mL] 450 mL PEEP:  [5 cmH20] 5 cmH20 Pressure Support:  [5 cmH20] 5 cmH20 Plateau Pressure:  [14 cmH20] 14 cmH20 INTAKE / OUTPUT:  Intake/Output Summary (Last 24 hours) at 02/06/16 0926 Last data filed at 02/06/16 0901  Gross per 24 hour  Intake   1331 ml  Output   2051 ml  Net   -720 ml    Physical Examination:   VS: BP 92/70 mmHg  Pulse 97  Temp(Src) 99.3 F (37.4 C) (Core (Comment))  Resp 18  Ht 5\' 7"  (1.702 m)  Wt 133 lb 13.1 oz (60.7 kg)  BMI 20.95 kg/m2  SpO2 100%  General Appearance:+distress  Neuro:without focal findings, mental status reduced.  HEENT: PERRLA, EOM intact, no ptosis, no other lesions noticed;  Pulmonary:shallow BS, improving BS on the left.  CardiovascularNormal S1,S2.  No m/r/g.    Abdomen: Benign, Soft, non-tender, No masses, hepatosplenomegaly, No lymphadenopathy Renal:  No costovertebral tenderness  GU:  Not performed at this time. Endoc: No evident thyromegaly, no signs of acromegaly. Skin:   warm, no rashes, no ecchymosis  Extremities: normal, no cyanosis,  clubbing.   LABS: Reviewed   LABORATORY PANEL:   CBC  Recent Labs Lab 02/05/16 0643  WBC 7.0  HGB 9.6*  HCT 28.9*  PLT 84*    Chemistries   Recent Labs Lab 02/01/16 0936  02/04/16 1609  02/06/16 0538  NA 172*  < > 150*  < > 155*  K 3.7  < > 3.2*  < > 3.1*  CL >130*  < > 129*  < > >130*  CO2 24  < > 18*  < > 20*  GLUCOSE 144*  < > 160*  < > 210*  BUN 79*  < > 41*  < > 39*  CREATININE 2.17*  < > 1.52*  < > 1.55*  CALCIUM 8.7*  < > 8.3*  < > 8.4*  MG  --   < > 2.4  --  2.7*  PHOS  --   < > 2.6  --   --   AST 56*  --   --   --   --   ALT 47  --   --   --   --   ALKPHOS 79  --   --   --   --   BILITOT 1.1  --   --   --   --   < > = values in this interval not displayed.   Recent Labs Lab 02/01/16 1552 02/03/16 2259 02/05/16 2006 02/06/16 0012 02/06/16 0442 02/06/16 0746  GLUCAP 125* 208* 150* 166* 197* 190*    Recent Labs Lab 02/01/16 1608 02/05/16 1233  PHART 7.40 7.33*  PCO2ART 34 41  PO2ART 62* 81*    Recent Labs Lab 02/01/16 0936  AST 56*  ALT 47  ALKPHOS 79  BILITOT 1.1  ALBUMIN 3.5    Cardiac Enzymes  Recent Labs Lab 02/04/16 1609  TROPONINI 0.17*    RADIOLOGY:  Dg Abd 1 View  02/05/2016  CLINICAL DATA:  Status post NG tube placement EXAM: ABDOMEN - 1 VIEW COMPARISON:  02/04/2016 FINDINGS: The nasogastric tube tip is in the projection of the left upper quadrant of the abdomen. The side port is well below the expected location of the GE junction. Cholecystectomy clips are identified in the right upper quadrant. There is gaseous distension of the large and small bowel loops. IMPRESSION: 1. Nasogastric tube is in the projection of the left upper quadrant of the abdomen. 2. Gaseous distension of the bowel loops. Electronically Signed   By: Kerby Moors M.D.   On: 02/05/2016 12:22   Portable Chest Xray  02/06/2016  CLINICAL DATA:  Respiratory failure. EXAM: PORTABLE CHEST 1 VIEW COMPARISON:  02/05/2016 FINDINGS: Endotracheal tube is  3.5 cm above the carina. Nasogastric tube tip in the gastric fundus. Persistent opacity in the retrocardiac space with probable left pleural fluid. Right lung is clear. Heart size is within normal limits and stable. 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 IMPRESSION: Stable densities at the left lung base. Findings are suggestive for consolidation and pleural fluid. Minimal change from the prior examination. Support apparatuses are appropriately positioned. Electronically Signed   By: Markus Daft M.D.   On: 02/06/2016 07:22   Dg Chest [facility] 1 [address]  02/05/2016  CLINICAL DATA:  Endotracheal tube placement. EXAM: PORTABLE CHEST 1 VIEW COMPARISON:  02/04/2016 FINDINGS: The endotracheal the it is 3.5 cm above the carina. The NGT and is in the stomach. The pacer wires are stable. Interval re-expansion of the left lung with some post expansion edema. Persistent left basilar atelectasis and probable small left effusion. The right lung remains clear. IMPRESSION: The endotracheal tube is in good position, 3.5 cm above the carina. The NG tube is in good position in the stomach. Re-aeration/ Re-expansion of the left lung. Some residual left lower lobe atelectasis and small left effusion. Electronically Signed   By: Marijo Sanes M.D.   On: 02/05/2016 12:23   Dg Chest Port 1 View  02/04/2016  CLINICAL DATA:  Collapsed lung, sepsis, acute renal failure, former smoker EXAM: PORTABLE CHEST 1 VIEW COMPARISON:  Portable exam 1955 hours compared to 1017 hours FINDINGS: Complete opacification of the LEFT hemithorax with mediastinal shift to the LEFT compatible with LEFT lung atelectasis. This is new since 02/03/2016 exam when patient demonstrated bibasilar consolidation. Abrupt termination of the air column within the LEFT mainstem bronchus cannot exclude mucous plugging Hyperexpanded RIGHT lung is clear. No pneumothorax. Bones unremarkable. IMPRESSION: Complete opacification of the LEFT hemi thorax with associated volume loss likely  representing a combination of LEFT lung atelectasis and underlying consolidation seen on an earlier study of 02/03/2016. Unable to exclude mucous plugging/endobronchial obstruction of the LEFT mainstem bronchus ; followup until resolution recommended. Electronically Signed   By: Lavonia Dana M.D.   On: 02/04/2016 20:26   Dg Chest Port 1 View  02/04/2016  CLINICAL DATA:  Found unresponsive at group home. Respiratory distress, pneumonia. EXAM: PORTABLE CHEST 1 VIEW COMPARISON:  02/03/2016 and 02/01/2016. FINDINGS: 1017 hours. Two views were obtained. There is new complete opacification of the left hemithorax with volume loss and mediastinal shift to the left. Patchy right basilar airspace disease is unchanged. There is no evidence of pneumothorax or significant pleural effusion on the right. Left subclavian AICD lead appears unchanged. No acute osseous findings are seen. IMPRESSION: Complete left lung collapse, likely due to progressive pneumonia as correlated with recent prior studies. Underlying central neoplasm cannot be excluded, and continued follow-up warranted. Electronically Signed   By: Richardean Sale M.D.   On: 02/04/2016 10:55    I have personally obtained a history, examined the patient, evaluated laboratory and imaging results, formulated the assessment and plan and placed orders. CRITICAL CARE: The patient is critically ill with multiple organ systems failure and requires high complexity decision making for assessment and support, frequent evaluation and titration of therapies, application of advanced monitoring technologies and extensive interpretation of multiple databases.   Critical Care Time devoted to patient care services described in this note is 35 minutes.    Vilinda Boehringer, MD [facility] Pager [phone] (please enter 7-digits) On Call Pager - [phone] (please enter 7-digits)

## 2016-02-06 NOTE — Progress Notes (Signed)
[facility]  ROUNDING NOTE   Subjective:   Intubated, bronchoscopy yesterday.  Na 155 - started on 1/2NS at 75   Objective:  Vital signs in last 24 hours:  Temp:  [97.2 F (36.2 C)-100.8 F (38.2 C)] 99.3 F (37.4 C) (03/29 0800) Pulse Rate:  [27-111] 102 (03/29 0700) Resp:  [14-49] 17 (03/29 0800) BP: (79-123)/(51-77) 98/66 mmHg (03/29 0800) SpO2:  [93 %-100 %] 100 % (03/29 0828) FiO2 (%):  [28 %-40 %] 28 % (03/29 0828) Weight:  [60.7 kg (133 lb 13.1 oz)] 60.7 kg (133 lb 13.1 oz) (03/29 0500)  Weight change:  Filed Weights   02/02/16 0458 02/03/16 2300 02/06/16 0500  Weight: 62.5 kg (137 lb 12.6 oz) 69 kg (152 lb 1.9 oz) 60.7 kg (133 lb 13.1 oz)    Intake/Output: I/O last 3 completed shifts: In: 1236.5 [I.V.:81; NG/GT:430.5; IV Piggyback:725] Out: 2130 [Urine:3260; Stool:1]   Intake/Output this shift:  Total I/O In: 215 [I.V.:75; NG/GT:140] Out: -   Physical Exam: General: Critically ill appearing  Head: Normocephalic, atraumatic. +ETT +OGT  Eyes: Anicteric  Neck: Supple, trachea midline  Lungs:  Diminished bilaterally, PRVC 28% Pressure support  Heart: regular  Abdomen:  Soft, nontender, BS present   Extremities: no peripheral edema.  Neurologic: Sedated, intubated  Skin: No lesions       Basic Metabolic Panel:  Recent Labs Lab 02/02/16 0251 02/03/16 0610 02/03/16 2214 02/04/16 0531 02/04/16 1006 02/04/16 1609 02/05/16 0643 02/06/16 0538  NA 160* 152* 148* 151*  --  150* 155* 155*  K 3.3* 3.8 2.7* 3.5  --  3.2* 3.5 3.1*  CL >130* >130* 127* 128*  --  129* >130* >130*  CO2 19* 19* 18* 18*  --  18* 20* 20*  GLUCOSE 230* 179* 222* 142*  --  160* 142* 210*  BUN 89* 70* 54* 50*  --  41* 39* 39*  CREATININE 2.99* 2.50* 2.03* 1.91*  --  1.52* 1.65* 1.55*  CALCIUM 7.9* 8.0* 8.1* 8.5*  --  8.3* 8.7* 8.4*  MG 2.3 2.4  --   --  2.4 2.4  --  2.7*  PHOS 1.5* 5.1*  --   --   --  2.6  --   --     Liver Function Tests:  Recent Labs Lab  02/01/16 0936  AST 56*  ALT 47  ALKPHOS 79  BILITOT 1.1  PROT 7.1  ALBUMIN 3.5   No results for input(s): LIPASE, AMYLASE in the last 168 hours. No results for input(s): AMMONIA in the last 168 hours.  CBC:  Recent Labs Lab 02/01/16 0936 02/02/16 0251 02/03/16 0610 02/04/16 0531 02/05/16 0643  WBC 5.4 6.9 9.7 12.0* 7.0  NEUTROABS 4.5  --  8.2*  --   --   HGB 11.5* 9.8* 9.1* 9.0* 9.6*  HCT 35.2* 29.7* 27.4* 28.5* 28.9*  MCV 94.8 93.4 95.0 96.0 93.6  PLT 141* 112* 87* 90* 84*    Cardiac Enzymes:  Recent Labs Lab 02/01/16 0936 02/03/16 2214 02/04/16 0531 02/04/16 1006 02/04/16 1609  TROPONINI <0.03 0.10* 0.18* 0.09* 0.17*    BNP: Invalid input(s): POCBNP  CBG:  Recent Labs Lab 02/03/16 2259 02/05/16 2006 02/06/16 0012 02/06/16 0442 02/06/16 0746  GLUCAP 208* 150* 166* 197* 190*    Microbiology: Results for orders placed or performed during the hospital encounter of 02/01/16  Urine culture     Status: None   Collection Time: 02/01/16  9:36 AM  Result Value Ref Range Status  Specimen Description URINE, RANDOM  Final   Special Requests NONE  Final   Culture NO GROWTH 2 DAYS  Final   Report Status 02/03/2016 FINAL  Final  Culture, blood (routine x 2)     Status: None (Preliminary result)   Collection Time: 02/01/16  9:36 AM  Result Value Ref Range Status   Specimen Description BLOOD LEFT HAND  Final   Special Requests BOTTLES DRAWN AEROBIC AND ANAEROBIC  1CC  Final   Culture NO GROWTH 4 DAYS  Final   Report Status PENDING  Incomplete  Culture, blood (routine x 2)     Status: None (Preliminary result)   Collection Time: 02/01/16 10:00 AM  Result Value Ref Range Status   Specimen Description BLOOD RIGHT HAND  Final   Special Requests BOTTLES DRAWN AEROBIC AND ANAEROBIC  1CC  Final   Culture NO GROWTH 4 DAYS  Final   Report Status PENDING  Incomplete  MRSA PCR Screening     Status: None   Collection Time: 02/01/16  5:00 PM  Result Value Ref  Range Status   MRSA by PCR NEGATIVE NEGATIVE Final    Comment:        The GeneXpert MRSA Assay (FDA approved for NASAL specimens only), is one component of a comprehensive MRSA colonization surveillance program. It is not intended to diagnose MRSA infection nor to guide or monitor treatment for MRSA infections.   Urine culture     Status: None   Collection Time: 02/02/16  5:56 PM  Result Value Ref Range Status   Specimen Description URINE, RANDOM  Final   Special Requests NONE  Final   Culture NO GROWTH 2 DAYS  Final   Report Status 02/04/2016 FINAL  Final  Culture, blood (Routine X 2) w Reflex to ID Panel     Status: None (Preliminary result)   Collection Time: 02/02/16  6:30 PM  Result Value Ref Range Status   Specimen Description BLOOD RIGHT HAND  Final   Special Requests   Final    BOTTLES DRAWN AEROBIC AND ANAEROBIC  AER 4CC ANA 10CC   Culture NO GROWTH 3 DAYS  Final   Report Status PENDING  Incomplete  Culture, blood (Routine X 2) w Reflex to ID Panel     Status: None (Preliminary result)   Collection Time: 02/02/16  6:44 PM  Result Value Ref Range Status   Specimen Description BLOOD BLOOD LEFT HAND  Final   Special Requests BOTTLES DRAWN AEROBIC AND ANAEROBIC 6CC  Final   Culture NO GROWTH 3 DAYS  Final   Report Status PENDING  Incomplete    Coagulation Studies: No results for input(s): LABPROT, INR in the last 72 hours.  Urinalysis: No results for input(s): COLORURINE, LABSPEC, PHURINE, GLUCOSEU, HGBUR, BILIRUBINUR, KETONESUR, PROTEINUR, UROBILINOGEN, NITRITE, LEUKOCYTESUR in the last 72 hours.  Invalid input(s): APPERANCEUR    Imaging: Dg Abd 1 View  02/05/2016  CLINICAL DATA:  Status post NG tube placement EXAM: ABDOMEN - 1 VIEW COMPARISON:  02/04/2016 FINDINGS: The nasogastric tube tip is in the projection of the left upper quadrant of the abdomen. The side port is well below the expected location of the GE junction. Cholecystectomy clips are identified in  the right upper quadrant. There is gaseous distension of the large and small bowel loops. IMPRESSION: 1. Nasogastric tube is in the projection of the left upper quadrant of the abdomen. 2. Gaseous distension of the bowel loops. Electronically Signed   By: Queen Slough.D.  On: 02/05/2016 12:22   Portable Chest Xray  02/06/2016  CLINICAL DATA:  Respiratory failure. EXAM: PORTABLE CHEST 1 VIEW COMPARISON:  02/05/2016 FINDINGS: Endotracheal tube is 3.5 cm above the carina. Nasogastric tube tip in the gastric fundus. Persistent opacity in the retrocardiac space with probable left pleural fluid. Right lung is clear. Heart size is within normal limits and stable. 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 IMPRESSION: Stable densities at the left lung base. Findings are suggestive for consolidation and pleural fluid. Minimal change from the prior examination. Support apparatuses are appropriately positioned. Electronically Signed   By: Markus Daft M.D.   On: 02/06/2016 07:22   Dg Chest Port 1 View  02/05/2016  CLINICAL DATA:  Endotracheal tube placement. EXAM: PORTABLE CHEST 1 VIEW COMPARISON:  02/04/2016 FINDINGS: The endotracheal the it is 3.5 cm above the carina. The NGT and is in the stomach. The pacer wires are stable. Interval re-expansion of the left lung with some post expansion edema. Persistent left basilar atelectasis and probable small left effusion. The right lung remains clear. IMPRESSION: The endotracheal tube is in good position, 3.5 cm above the carina. The NG tube is in good position in the stomach. Re-aeration/ Re-expansion of the left lung. Some residual left lower lobe atelectasis and small left effusion. Electronically Signed   By: Marijo Sanes M.D.   On: 02/05/2016 12:23   Dg Chest Port 1 View  02/04/2016  CLINICAL DATA:  Collapsed lung, sepsis, acute renal failure, former smoker EXAM: PORTABLE CHEST 1 VIEW COMPARISON:  Portable exam 1955 hours compared to 1017 hours FINDINGS: Complete  opacification of the LEFT hemithorax with mediastinal shift to the LEFT compatible with LEFT lung atelectasis. This is new since 02/03/2016 exam when patient demonstrated bibasilar consolidation. Abrupt termination of the air column within the LEFT mainstem bronchus cannot exclude mucous plugging Hyperexpanded RIGHT lung is clear. No pneumothorax. Bones unremarkable. IMPRESSION: Complete opacification of the LEFT hemi thorax with associated volume loss likely representing a combination of LEFT lung atelectasis and underlying consolidation seen on an earlier study of 02/03/2016. Unable to exclude mucous plugging/endobronchial obstruction of the LEFT mainstem bronchus ; followup until resolution recommended. Electronically Signed   By: Lavonia Dana M.D.   On: 02/04/2016 20:26   Dg Chest Port 1 View  02/04/2016  CLINICAL DATA:  Found unresponsive at group home. Respiratory distress, pneumonia. EXAM: PORTABLE CHEST 1 VIEW COMPARISON:  02/03/2016 and 02/01/2016. FINDINGS: 1017 hours. Two views were obtained. There is new complete opacification of the left hemithorax with volume loss and mediastinal shift to the left. Patchy right basilar airspace disease is unchanged. There is no evidence of pneumothorax or significant pleural effusion on the right. Left subclavian AICD lead appears unchanged. No acute osseous findings are seen. IMPRESSION: Complete left lung collapse, likely due to progressive pneumonia as correlated with recent prior studies. Underlying central neoplasm cannot be excluded, and continued follow-up warranted. Electronically Signed   By: Richardean Sale M.D.   On: 02/04/2016 10:55     Medications:     . anidulafungin  100 mg Intravenous Q24H  . antiseptic oral rinse  7 mL Mouth Rinse QID  . aspirin  300 mg Rectal Daily  . chlorhexidine gluconate (SAGE KIT)  15 mL Mouth Rinse BID  . feeding supplement (VITAL HIGH PROTEIN)  1,000 mL Per Tube Q24H  . free water  100 mL Per Tube 6 times per day   . insulin aspart  0-5 Units Subcutaneous QHS  . insulin  aspart  0-9 Units Subcutaneous 4 times per day  . ipratropium-albuterol  3 mL Nebulization Q6H  . levETIRAcetam  750 mg Intravenous Q12H  . LORazepam  0.25 mg Intravenous Q12H  . pantoprazole (PROTONIX) IV  40 mg Intravenous Q24H  . piperacillin-tazobactam (ZOSYN)  IV  3.375 g Intravenous 3 times per day  . risperiDONE  0.5 mg Oral BID  . sodium chloride flush  3 mL Intravenous Q12H   acetaminophen, fentaNYL (SUBLIMAZE) injection, fentaNYL (SUBLIMAZE) injection, LORazepam, midazolam, midazolam, morphine injection, ondansetron **OR** ondansetron (ZOFRAN) IV  Assessment/ Plan:  60 y.o. black male with no significant past medical history, who was admitted to [facility] on 02/01/2016 for evaluation of altered mental status. He was found have severe metabolic derangements upon admission. He was found to have a right middle lobe pneumonia.   1. Acute renal failure secondary to acute tubular necrosis, in the setting of pneumonia:  No known baseline creatinine.  Creatinine stable. Nonoliguric urine output.  - No acute indication for dialysis. Renally dose all medications.   2. Hypernatremia: Free water deficit of 4.4 litres. Now with pulmonary edema  - Restart D5W at 25 - Increase free water flushes to 232m q4 hours   3. Metabolic Acidosis: CO2 20. With hypokalemia.   4. Right lower lobe bacterial pneumonia. Febrile overnight.  - appreciate pulmonary input  5. Anemia with acute renal failure: hemoglobin 9.6 - has not received epo.     LOS: [place]3/29/20178:43 AM

## 2016-02-06 NOTE — Progress Notes (Signed)
eLink Physician-Brief Progress Note Patient Name: [name] DOB: Mar 28, 1956 MRN: [id number]   Date of Service  02/06/2016  HPI/Events of Note  Called with critical Cl- > 130.  eICU Interventions  Will start IV fluid with 0.45 NaCl to run IV at 75 mL/hour.      Intervention Category Intermediate Interventions: Electrolyte abnormality - evaluation and management  [name] 02/06/2016, 6:43 AM

## 2016-02-06 NOTE — Progress Notes (Signed)
Initial Nutrition Assessment    INTERVENTION:   EN: recommend increasing Vital High Protein to new goal rate of 65 ml/hr providing 1560 kcals, 137 g of protein, 1310 mL of free water. Additional 1200 mL of free water from scheduled free water flushes in addition to flushes with meds at present. Calories from D5 also taken into consideration. Meets 100% of estimated calorie and protein needs per ASPEN guidelines   NUTRITION DIAGNOSIS:   Inadequate oral intake related to acute illness as evidenced by NPO status.  GOAL:   Provide needs based on ASPEN/SCCM guidelines  MONITOR:   Labs, Vent status, I & O's, Weight trends, TF tolerance  REASON FOR ASSESSMENT:   Ventilator, Consult Enteral/tube feeding initiation and management  ASSESSMENT:   60 yo male admitted with acute respiratory failure due to pneumonia, sepsis, AKI due to dehdration with severe hypernatremia. Pt currently sedated on vent  Past Medical History  Diagnosis Date  . Discontinued smoking     Diet Order:  Diet NPO time specified   EN: tolerating Vital High Protein at rate of 40 ml/hr, free water flushes at 200 mL q 4 hours per MD order  Digestive System: abdomen soft, BS active, +BM   Skin:   (multiple deep tissue injuries)  Last BM:  02/06/16    Recent Labs Lab 02/02/16 0251 02/03/16 0610  02/04/16 1006 02/04/16 1609 02/05/16 0643 02/06/16 0538 02/06/16 1000  NA 160* 152*  < >  --  150* 155* 155* 157*  K 3.3* 3.8  < >  --  3.2* 3.5 3.1*  --   CL >130* >130*  < >  --  129* >130* >130*  --   CO2 19* 19*  < >  --  18* 20* 20*  --   BUN 89* 70*  < >  --  41* 39* 39*  --   CREATININE 2.99* 2.50*  < >  --  1.52* 1.65* 1.55*  --   CALCIUM 7.9* 8.0*  < >  --  8.3* 8.7* 8.4*  --   MG 2.3 2.4  --  2.4 2.4  --  2.7*  --   PHOS 1.5* 5.1*  --   --  2.6  --   --   --   GLUCOSE 230* 179*  < >  --  160* 142* 210*  --   < > = values in this interval not displayed.  Glucose Profile:   Recent Labs  02/06/16 0442 02/06/16 0746 02/06/16 1155  GLUCAP 197* 190* 205*   Meds: D5 at 50 ml/hr (204 kcals), ss novolog, potassium chloride  Height:   Ht Readings from Last 1 Encounters:  02/03/16 5\' 7"  (1.702 m)    Weight:   Wt Readings from Last 1 Encounters:  02/06/16 133 lb 13.1 oz (60.7 kg)    Filed Weights   02/02/16 0458 02/03/16 2300 02/06/16 0500  Weight: 137 lb 12.6 oz (62.5 kg) 152 lb 1.9 oz (69 kg) 133 lb 13.1 oz (60.7 kg)    BMI:  Body mass index is 20.95 kg/(m^2).  Estimated Nutritional Needs:   Kcal:  1856 kcals (Ve: 11.7, Tmax: 38.2)   Protein:  92-122 g (1.5-2.0 g/kg)   Fluid:  1.8 L  EDUCATION NEEDS:   Education needs no appropriate at this time  [place], [place], [place] [phone] Pager  [phone] Weekend/On-Call Pager

## 2016-02-07 LAB — CBC
HEMATOCRIT: 22.9 % — AB (ref 40.0–52.0)
Hemoglobin: 7.6 g/dL — ABNORMAL LOW (ref 13.0–18.0)
MCH: 31.3 pg (ref 26.0–34.0)
MCHC: 33.1 g/dL (ref 32.0–36.0)
MCV: 94.6 fL (ref 80.0–100.0)
PLATELETS: 105 10*3/uL — AB (ref 150–440)
RBC: 2.42 MIL/uL — AB (ref 4.40–5.90)
RDW: 15.5 % — ABNORMAL HIGH (ref 11.5–14.5)
WBC: 4.8 10*3/uL (ref 3.8–10.6)

## 2016-02-07 LAB — BASIC METABOLIC PANEL
Anion gap: UNDETERMINED (ref 5–15)
BUN: 37 mg/dL — AB (ref 6–20)
CO2: 23 mmol/L (ref 22–32)
CREATININE: 1.45 mg/dL — AB (ref 0.61–1.24)
Calcium: 8.4 mg/dL — ABNORMAL LOW (ref 8.9–10.3)
Chloride: >130 mmol/L (ref 101–111)
GFR calc Af Amer: 60 mL/min — ABNORMAL LOW (ref >60)
GFR calc non Af Amer: 52 mL/min — ABNORMAL LOW (ref >60)
GLUCOSE: 196 mg/dL — AB (ref 65–99)
POTASSIUM: 3.2 mmol/L — AB (ref 3.5–5.1)
Sodium: 157 mmol/L — ABNORMAL HIGH (ref 135–145)

## 2016-02-07 LAB — GLUCOSE, CAPILLARY
GLUCOSE-CAPILLARY: 239 mg/dL — AB (ref 65–99)
GLUCOSE-CAPILLARY: 199 mg/dL — AB (ref 65–99)
Glucose-Capillary: 200 mg/dL — ABNORMAL HIGH (ref 65–99)
Glucose-Capillary: 201 mg/dL — ABNORMAL HIGH (ref 65–99)
Glucose-Capillary: 217 mg/dL — ABNORMAL HIGH (ref 65–99)

## 2016-02-07 LAB — SODIUM
Sodium: 155 mmol/L — ABNORMAL HIGH (ref 135–145)
Sodium: 152 mmol/L — ABNORMAL HIGH (ref 135–145)
Sodium: 153 mmol/L — ABNORMAL HIGH (ref 135–145)

## 2016-02-07 LAB — CULTURE, BLOOD (ROUTINE X 2)
CULTURE: NO GROWTH
CULTURE: NO GROWTH

## 2016-02-07 MED ORDER — FREE WATER
300.0000 mL | Status: DC
  Administered 2016-02-07 – 2016-02-08 (×6): 300 mL

## 2016-02-07 MED ORDER — DEXTROSE 5 % IV SOLN
INTRAVENOUS | Status: DC
  Administered 2016-02-08: 10:00:00 via INTRAVENOUS

## 2016-02-07 MED ORDER — POTASSIUM CHLORIDE 20 MEQ PO PACK
40.0000 meq | PACK | Freq: Three times a day (TID) | ORAL | Status: AC
  Administered 2016-02-07 (×3): 40 meq
  Filled 2016-02-07 (×3): qty 2

## 2016-02-07 MED ORDER — SODIUM CHLORIDE 0.9 % IV SOLN
100.0000 mg | INTRAVENOUS | Status: DC
  Administered 2016-02-07 – 2016-02-08 (×2): 100 mg via INTRAVENOUS
  Filled 2016-02-07 (×4): qty 100

## 2016-02-07 NOTE — Progress Notes (Signed)
While turning and repositioning patient he became agitated and combative swinging right arm at RN and NT.  Safety mittens placed on patient.

## 2016-02-07 NOTE — Progress Notes (Signed)
Inpatient Diabetes Program Recommendations  AACE/ADA: New Consensus Statement on Inpatient Glycemic Control (2015)  Target Ranges:  Prepandial:   less than 140 mg/dL      Peak postprandial:   less than 180 mg/dL (1-2 hours)      Critically ill patients:  140 - 180 mg/dL   Review of Glycemic Control  Results for CHIEDOZIE, OVERMEYER (MRN [id number]) as of 02/07/2016 12:08  Ref. Range 02/06/2016 22:03 02/07/2016 00:07 02/07/2016 02:39 02/07/2016 07:16 02/07/2016 11:40  Glucose-Capillary Latest Ref Range: 65-99 mg/dL 224 (H) 217 (H) 201 (H) 200 (H) 239 (H)    Diabetes history: unknown Outpatient Diabetes medications: none Current orders for Inpatient glycemic control: Novolog 0-9 units tid, Novolog 0-5 units qhs  Inpatient Diabetes Program Recommendations: Please consider changing Novolog to q4h since the patient is NPO and blood sugars remain elevated.  Consider starting low dose basal insulin, Lantus 7 units qday. (0.1 unit/kg)   Gentry Fitz, RN, [place], [place], CDE Diabetes Coordinator Inpatient Diabetes Program  [phone] (Team Pager) [phone] ([place]) 02/07/2016 12:11 PM

## 2016-02-07 NOTE — Consult Note (Signed)
[facility] Consultation     ASSESSMENT/PLAN   60 year old group home resident presents with pneumonia with septic shock. Severe hypernatremia with dehydration and AKI, now off vasopressors, left lung collapse, intubated on 02/05/2016, bronchoscopy with large mucous plug removed from left mainstem.  PULMONARY A:Acute respiratory failure secondary to pneumonia with sepsis - slowly improving Left lung collapse - intubated 3/28 Mucus plugging - resolved Status post bronchoscopy on 02/05/2016 Ventilator-dependent respiratory failure 02/04/2016 ?Fungal infection of upper airways (see bronch report).  P:   -Continue mechanical ventilation, wean as tolerated.  Possible extubation in next 1-2 days. Patient with hypernatremia and adjust IVFs and free H2O flushes.  -maintain sats >90% - as Needed nebulizers - SBT/WUA daily - ABG as needed - Follow-up BAL cultures (micro, viral, fungal, afb) - continue with Eraxis for suspected pulmonary fungal infection, follow up fungal BAL cultures.  - given hypernatremia and volume status, not ready to come off vent yet.  -I have discussed intubation and left lung collapse with family, they have agreed with intubation and will reevaluate goals of care in 3 or 4 days if unable to wean off ventilator  CARDIOVASCULAR -Septic shock due to pneumonia, with hypotension-resolved, off vasopressors   RENAL A:  Acute kidney injury. -Severe hypernatremia, likely secondary to dehydration. Na=152 today P:   -started on D5W@50cc , Free H2O flushes increased to 300 Q4hrs -Nephrology following  -f/u BMP, Mg/Phos  INFECTIOUS A:  Pneumonia with sepsis. P:   Continue antibiotics.   BCx2 3/242: Pending UC 3/24: Pending Sputum-- Vancomycin 3/24>> Zosyn 3/20.>> MRSA and INF tests NEGATIVE BAL 3/28>>    NEUROLOGIC A: Acute metabolic encephalopathy, likely due to septic shock as well as acute hypernatremia. Hx of multiple  CVAs -Continue treatment of underlying metabolic abnormalities.  Name: [name] MRN: [id number] DOB: 1956-06-20    ADMISSION DATE:  02/01/2016 CONSULTATION DATE:  02/01/16  REFERRING MD : Dr. Ether Griffins  CHIEF COMPLAINT:  Dyspnea.    SUBJECTIVE: Tolerated WUA well today, however, given hypernatremia and volume status, not ready to come off vent yet.  Na elevated this AM, patient with lethargy this AM.  No Known Allergies  REVIEW OF SYSTEMS:   Cannot provide a review of systems due to intubated.    VITAL SIGNS: Temp:  [98.2 F (36.8 C)-99.7 F (37.6 C)] 99.5 F (37.5 C) (03/30 1900) Pulse Rate:  [74-92] 82 (03/30 1900) Resp:  [13-25] 17 (03/30 1900) BP: (89-112)/(59-75) 103/68 mmHg (03/30 1900) SpO2:  [98 %-100 %] 100 % (03/30 1900) FiO2 (%):  [28 %] 28 % (03/30 1556) Weight:  [136 lb 11 oz (62 kg)] 136 lb 11 oz (62 kg) (03/30 0500) HEMODYNAMICS:   VENTILATOR SETTINGS: Vent Mode:  [-] PSV;CPAP FiO2 (%):  [28 %] 28 % PEEP:  [5 cmH20] 5 cmH20 Pressure Support:  [5 cmH20-10 cmH20] 10 cmH20 INTAKE / OUTPUT:  Intake/Output Summary (Last 24 hours) at 02/07/16 2050 Last data filed at 02/07/16 1924  Gross per 24 hour  Intake 6015.83 ml  Output   2752 ml  Net 3263.83 ml    Physical Examination:   VS: BP 103/68 mmHg  Pulse 82  Temp(Src) 99.5 F (37.5 C) (Core (Comment))  Resp 17  Ht 5\' 7"  (1.702 m)  Wt 136 lb 11 oz (62 kg)  BMI 21.40 kg/m2  SpO2 100%  General Appearance:+distress  Neuro:without focal findings, mental status reduced.  HEENT: PERRLA, EOM intact, no ptosis, no other lesions noticed;  Pulmonary:shallow BS, improving BS on the  left.   CardiovascularNormal S1,S2.  No m/r/g.    Abdomen: Benign, Soft, non-tender, No masses, hepatosplenomegaly, No lymphadenopathy Renal:  No costovertebral tenderness  GU:  Not performed at this time. Endoc: No evident thyromegaly, no signs of acromegaly. Skin:   warm, no rashes, no ecchymosis  Extremities: normal, no  cyanosis, clubbing.   LABS: Reviewed   LABORATORY PANEL:   CBC  Recent Labs Lab 02/07/16 0331  WBC 4.8  HGB 7.6*  HCT 22.9*  PLT 105*    Chemistries   Recent Labs Lab 02/01/16 0936  02/04/16 1609  02/06/16 0538  02/07/16 0331  02/07/16 1452  NA 172*  < > 150*  < > 155*  < > 157*  < > 152*  K 3.7  < > 3.2*  < > 3.1*  --  3.2*  --   --   CL >130*  < > 129*  < > >130*  --  >130*  --   --   CO2 24  < > 18*  < > 20*  --  23  --   --   GLUCOSE 144*  < > 160*  < > 210*  --  196*  --   --   BUN 79*  < > 41*  < > 39*  --  37*  --   --   CREATININE 2.17*  < > 1.52*  < > 1.55*  --  1.45*  --   --   CALCIUM 8.7*  < > 8.3*  < > 8.4*  --  8.4*  --   --   MG  --   < > 2.4  --  2.7*  --   --   --   --   PHOS  --   < > 2.6  --   --   --   --   --   --   AST 56*  --   --   --   --   --   --   --   --   ALT 47  --   --   --   --   --   --   --   --   ALKPHOS 79  --   --   --   --   --   --   --   --   BILITOT 1.1  --   --   --   --   --   --   --   --   < > = values in this interval not displayed.   Recent Labs Lab 02/06/16 2203 02/07/16 0007 02/07/16 0239 02/07/16 0716 02/07/16 1140 02/07/16 1631  GLUCAP 224* 217* 201* 200* 239* 199*    Recent Labs Lab 02/01/16 1608 02/05/16 1233  PHART 7.40 7.33*  PCO2ART 34 41  PO2ART 62* 81*    Recent Labs Lab 02/01/16 0936  AST 56*  ALT 47  ALKPHOS 79  BILITOT 1.1  ALBUMIN 3.5    Cardiac Enzymes  Recent Labs Lab 02/04/16 1609  TROPONINI 0.17*    RADIOLOGY:  Portable Chest Xray  02/06/2016  CLINICAL DATA:  Respiratory failure. EXAM: PORTABLE CHEST 1 VIEW COMPARISON:  02/05/2016 FINDINGS: Endotracheal tube is 3.5 cm above the carina. Nasogastric tube tip in the gastric fundus. Persistent opacity in the retrocardiac space with probable left pleural fluid. Right lung is clear. Heart size is within normal limits and stable. 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 IMPRESSION: Stable  densities at the left lung base.  Findings are suggestive for consolidation and pleural fluid. Minimal change from the prior examination. Support apparatuses are appropriately positioned. Electronically Signed   By: Markus Daft M.D.   On: 02/06/2016 07:22    I have personally obtained a history, examined the patient, evaluated laboratory and imaging results, formulated the assessment and plan and placed orders. CRITICAL CARE: The patient is critically ill with multiple organ systems failure and requires high complexity decision making for assessment and support, frequent evaluation and titration of therapies, application of advanced monitoring technologies and extensive interpretation of multiple databases.   Critical Care Time devoted to patient care services described in this note is 35 minutes.    Vilinda Boehringer, MD [facility] Pager [phone] (please enter 7-digits) On Call Pager - [phone] (please enter 7-digits)

## 2016-02-07 NOTE — Progress Notes (Signed)
Speech Therapy Note: reviewed chart notes; pt remains orally intubated and on vent support at this time. ST will f/u when appropriate.

## 2016-02-07 NOTE — Progress Notes (Signed)
[facility] [place] NAME: [name]    MR#:  [id number]  DATE OF BIRTH:  27-Dec-1955  SUBJECTIVE:  CHIEF COMPLAINT:  Remains intubated sodium  Little improved   REVIEW OF SYSTEMS:  Review of systems unobtainable as patient is intubated  DRUG ALLERGIES:  No Known Allergies  VITALS:  Blood pressure 108/72, pulse 77, temperature 98.6 F (37 C), temperature source Core (Comment), resp. rate 20, height 5\' 7"  (1.702 m), weight 62 kg (136 lb 11 oz), SpO2 100 %.  PHYSICAL EXAMINATION:  GENERAL:  60 y.o.-year-old patient lying in the bed critically  Ill on the ventilator nd, reactive to light and accommodation. No scleral icterus.  HEENT: Head atraumatic, normocephalic. Oropharynx-ET tube intact NECK:  Supple, no jugular venous distention. No thyroid enlargement, no tenderness.  LUNGS: Diminished breath sounds bilaterally without any rales rhonchi wheezing  CARDIOVASCULAR: S1, S2 normal. No murmurs, rubs, or gallops.  ABDOMEN: Soft, nontender, nondistended. Bowel sounds present. No organomegaly or mass.  EXTREMITIES: No pedal edema, cyanosis, or clubbing.  NEUROLOGIC:  intubated and sedated psych-intubated    CBC  Recent Labs Lab 02/07/16 0331  WBC 4.8  HGB 7.6*  HCT 22.9*  PLT 105*   ------------------------------------------------------------------------------------------------------------------  Chemistries   Recent Labs Lab 02/01/16 0936  02/06/16 0538  02/07/16 0331 02/07/16 0852  NA 172*  < > 155*  < > 157* 155*  K 3.7  < > 3.1*  --  3.2*  --   CL >130*  < > >130*  --  >130*  --   CO2 24  < > 20*  --  23  --   GLUCOSE 144*  < > 210*  --  196*  --   BUN 79*  < > 39*  --  37*  --   CREATININE 2.17*  < > 1.55*  --  1.45*  --   CALCIUM 8.7*  < > 8.4*  --  8.4*  --   MG  --   < > 2.7*  --   --   --   AST 56*  --   --   --   --   --   ALT 47  --   --   --   --   --   ALKPHOS 79  --   --   --   --   --    BILITOT 1.1  --   --   --   --   --   < > = values in this interval not displayed. ------------------------------------------------------------------------------------------------------------------  Cardiac Enzymes  Recent Labs Lab 02/04/16 1609  TROPONINI 0.17*   ------------------------------------------------------------------------------------------------------------------  RADIOLOGY:  Dg Abd 1 View  02/05/2016  CLINICAL DATA:  Status post NG tube placement EXAM: ABDOMEN - 1 VIEW COMPARISON:  02/04/2016 FINDINGS: The nasogastric tube tip is in the projection of the left upper quadrant of the abdomen. The side port is well below the expected location of the GE junction. Cholecystectomy clips are identified in the right upper quadrant. There is gaseous distension of the large and small bowel loops. IMPRESSION: 1. Nasogastric tube is in the projection of the left upper quadrant of the abdomen. 2. Gaseous distension of the bowel loops. Electronically Signed   By: Kerby Moors M.D.   On: 02/05/2016 12:22   Portable Chest Xray  02/06/2016  CLINICAL DATA:  Respiratory failure. EXAM: PORTABLE CHEST 1 VIEW COMPARISON:  02/05/2016 FINDINGS: Endotracheal tube is 3.5 cm  above the carina. Nasogastric tube tip in the gastric fundus. Persistent opacity in the retrocardiac space with probable left pleural fluid. Right lung is clear. Heart size is within normal limits and stable. 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 IMPRESSION: Stable densities at the left lung base. Findings are suggestive for consolidation and pleural fluid. Minimal change from the prior examination. Support apparatuses are appropriately positioned. Electronically Signed   By: Markus Daft M.D.   On: 02/06/2016 07:22   Dg Chest Port 1 View  02/05/2016  CLINICAL DATA:  Endotracheal tube placement. EXAM: PORTABLE CHEST 1 VIEW COMPARISON:  02/04/2016 FINDINGS: The endotracheal the it is 3.5 cm above the carina. The NGT and is in the stomach.  The pacer wires are stable. Interval re-expansion of the left lung with some post expansion edema. Persistent left basilar atelectasis and probable small left effusion. The right lung remains clear. IMPRESSION: The endotracheal tube is in good position, 3.5 cm above the carina. The NG tube is in good position in the stomach. Re-aeration/ Re-expansion of the left lung. Some residual left lower lobe atelectasis and small left effusion. Electronically Signed   By: Marijo Sanes M.D.   On: 02/05/2016 12:23    EKG:   Orders placed or performed during the hospital encounter of 02/01/16  . EKG 12-Lead  . EKG 12-Lead  . EKG 12-Lead  . EKG 12-Lead  . EKG 12-Lead  . EKG 12-Lead  . EKG 12-Lead  . EKG 12-Lead  . ED EKG  . ED EKG  . ED EKG  . ED EKG  . EKG 12-Lead  . EKG 12-Lead    ASSESSMENT AND PLAN:   #1. Acute respiratory failure secondary to pneumonia with sepsis left lung collapse  from mucous plugging status post bronchoscopy Patient is intubated and bronchoscopies done 02/05/2016- reexpansion of the left lung noticed Appreciate pulmonology recommendations continue patient on broad-spectrum antibiotic therapy,  per pulmonary concern for fungal infection continue Eraxis    #3. Hypernatremia,   Continue D5W containing fluid as well as free water Appreciate nephrology input and follow-up Continue to monitor sodium closely   #4. Metabolic encephalopathy, secondary to sepsis and acute renal failure Continue ventilator and sedation assess mental status once extubated   #5. Acute renal failure, separate acute TUBular necrosis   Foley catheter is in, monitor intake and output closely  renal function improved   #6. Thrombocytopenia, Continue to monitor he is off Lovenox therapy   #7 failure to thrive  Palliative care consult is pending Currently patient is DO NOT RESUSCITATE  Prognosis poor   All the records are reviewed and case discussed with Care Management/Social  Workerr. Management plans discussed with the patient, family and they are in agreement.  CODE STATUS: DNR  TOTAL CRITICAL CARE TIME TAKING CARE OF THIS PATIENT: 35 minutes.      Dustin Flock M.D on 02/07/2016 at 11:52 AM  Between 7am to 6pm - Pager - [phone] After 6pm go to [email or web address] - password [email or web address] [facility]  Office  [phone]  CC: Primary care physician; Vista Mink, FNP

## 2016-02-07 NOTE — Consult Note (Signed)
MEDICATION RELATED CONSULT NOTE - FOLLOW UP  Pharmacy Consult for electrolyte management Indication: hypokalemia, hypomagnesemia  No Known Allergies  Patient Measurements: Height: 5\' 7"  (170.2 cm) Weight: 136 lb 11 oz (62 kg) IBW/kg (Calculated) : 66.1  Vital Signs: Temp: 98.6 F (37 C) (03/30 1500) BP: 98/68 mmHg (03/30 1500) Pulse Rate: 85 (03/30 1500) Intake/Output from previous day: 03/29 0701 - 03/30 0700 In: 4982.5 [I.V.:1850; NG/GT:2795; IV Piggyback:337.5] Out: 2450 [Urine:2450] Intake/Output from this shift: Total I/O In: 2092.5 [I.V.:600; Other:300; NG/GT:905; IV Piggyback:287.5] Out: 850 [Urine:850]  Assessment: [name] is a 60yo male admitted for PNA. Pharmacy consulted to manage electrolytes in this patient.  K= 3.2 Plan:  Potassium replaced with 40 meq PT x 3.   Will recheck with AM labs.   [name] D 02/07/2016,4:01 PM

## 2016-02-07 NOTE — Progress Notes (Signed)
[facility]  ROUNDING NOTE   Subjective:   Na 155 (157)   Objective:  Vital signs in last 24 hours:  Temp:  [98.4 F (36.9 C)-99.9 F (37.7 C)] 98.6 F (37 C) (03/30 0900) Pulse Rate:  [74-100] 84 (03/30 0900) Resp:  [14-24] 20 (03/30 0900) BP: (89-110)/(48-74) 96/68 mmHg (03/30 0900) SpO2:  [96 %-100 %] 99 % (03/30 0900) FiO2 (%):  [28 %] 28 % (03/30 0821) Weight:  [62 kg (136 lb 11 oz)] 62 kg (136 lb 11 oz) (03/30 0500)  Weight change: 1.3 kg (2 lb 13.9 oz) Filed Weights   02/03/16 2300 02/06/16 0500 02/07/16 0500  Weight: 69 kg (152 lb 1.9 oz) 60.7 kg (133 lb 13.1 oz) 62 kg (136 lb 11 oz)    Intake/Output: I/O last 3 completed shifts: In: 5667.2 [I.V.:1928; LX/BW:6203.5; IV Piggyback:545] Out: 5974 [Urine:3550]   Intake/Output this shift:  Total I/O In: 660 [I.V.:200; NG/GT:460] Out: 250 [Urine:250]  Physical Exam: General: Critically ill appearing  Head: Normocephalic, atraumatic. +ETT +OGT  Eyes: Anicteric  Neck: Supple, trachea midline  Lungs:  Diminished bilaterally, PRVC 28% Pressure support  Heart: regular  Abdomen:  Soft, nontender, BS present   Extremities: no peripheral edema.  Neurologic: Sedated, intubated  Skin: No lesions       Basic Metabolic Panel:  Recent Labs Lab 02/02/16 0251 02/03/16 0610  02/04/16 0531 02/04/16 1006 02/04/16 1609 02/05/16 0643 02/06/16 0538 02/06/16 1000 02/06/16 1519 02/06/16 2046 02/07/16 0331 02/07/16 0852  NA 160* 152*  < > 151*  --  150* 155* 155* 157* 159* 157* 157* 155*  K 3.3* 3.8  < > 3.5  --  3.2* 3.5 3.1*  --   --   --  3.2*  --   CL >130* >130*  < > 128*  --  129* >130* >130*  --   --   --  >130*  --   CO2 19* 19*  < > 18*  --  18* 20* 20*  --   --   --  23  --   GLUCOSE 230* 179*  < > 142*  --  160* 142* 210*  --   --   --  196*  --   BUN 89* 70*  < > 50*  --  41* 39* 39*  --   --   --  37*  --   CREATININE 2.99* 2.50*  < > 1.91*  --  1.52* 1.65* 1.55*  --   --   --  1.45*  --    CALCIUM 7.9* 8.0*  < > 8.5*  --  8.3* 8.7* 8.4*  --   --   --  8.4*  --   MG 2.3 2.4  --   --  2.4 2.4  --  2.7*  --   --   --   --   --   PHOS 1.5* 5.1*  --   --   --  2.6  --   --   --   --   --   --   --   < > = values in this interval not displayed.  Liver Function Tests:  Recent Labs Lab 02/01/16 0936  AST 56*  ALT 47  ALKPHOS 79  BILITOT 1.1  PROT 7.1  ALBUMIN 3.5   No results for input(s): LIPASE, AMYLASE in the last 168 hours. No results for input(s): AMMONIA in the last 168 hours.  CBC:  Recent Labs Lab 02/01/16  1224  02/03/16 0610 02/04/16 0531 02/05/16 0643 02/06/16 0538 02/07/16 0331  WBC 5.4  < > 9.7 12.0* 7.0 4.5 4.8  NEUTROABS 4.5  --  8.2*  --   --   --   --   HGB 11.5*  < > 9.1* 9.0* 9.6* 8.9* 7.6*  HCT 35.2*  < > 27.4* 28.5* 28.9* 26.6* 22.9*  MCV 94.8  < > 95.0 96.0 93.6 95.3 94.6  PLT 141*  < > 87* 90* 84* 92* 105*  < > = values in this interval not displayed.  Cardiac Enzymes:  Recent Labs Lab 02/01/16 0936 02/03/16 2214 02/04/16 0531 02/04/16 1006 02/04/16 1609  TROPONINI <0.03 0.10* 0.18* 0.09* 0.17*    BNP: Invalid input(s): POCBNP  CBG:  Recent Labs Lab 02/06/16 1626 02/06/16 2203 02/07/16 0007 02/07/16 0239 02/07/16 0716  GLUCAP 169* 224* 217* 201* 200*    Microbiology: Results for orders placed or performed during the hospital encounter of 02/01/16  Urine culture     Status: None   Collection Time: 02/01/16  9:36 AM  Result Value Ref Range Status   Specimen Description URINE, RANDOM  Final   Special Requests NONE  Final   Culture NO GROWTH 2 DAYS  Final   Report Status 02/03/2016 FINAL  Final  Culture, blood (routine x 2)     Status: None   Collection Time: 02/01/16  9:36 AM  Result Value Ref Range Status   Specimen Description BLOOD LEFT HAND  Final   Special Requests BOTTLES DRAWN AEROBIC AND ANAEROBIC  1CC  Final   Culture NO GROWTH 5 DAYS  Final   Report Status 02/06/2016 FINAL  Final  Culture, blood  (routine x 2)     Status: None   Collection Time: 02/01/16 10:00 AM  Result Value Ref Range Status   Specimen Description BLOOD RIGHT HAND  Final   Special Requests BOTTLES DRAWN AEROBIC AND ANAEROBIC  1CC  Final   Culture NO GROWTH 5 DAYS  Final   Report Status 02/06/2016 FINAL  Final  MRSA PCR Screening     Status: None   Collection Time: 02/01/16  5:00 PM  Result Value Ref Range Status   MRSA by PCR NEGATIVE NEGATIVE Final    Comment:        The GeneXpert MRSA Assay (FDA approved for NASAL specimens only), is one component of a comprehensive MRSA colonization surveillance program. It is not intended to diagnose MRSA infection nor to guide or monitor treatment for MRSA infections.   Urine culture     Status: None   Collection Time: 02/02/16  5:56 PM  Result Value Ref Range Status   Specimen Description URINE, RANDOM  Final   Special Requests NONE  Final   Culture NO GROWTH 2 DAYS  Final   Report Status 02/04/2016 FINAL  Final  Culture, blood (Routine X 2) w Reflex to ID Panel     Status: None (Preliminary result)   Collection Time: 02/02/16  6:30 PM  Result Value Ref Range Status   Specimen Description BLOOD RIGHT HAND  Final   Special Requests   Final    BOTTLES DRAWN AEROBIC AND ANAEROBIC  AER 4CC ANA 10CC   Culture NO GROWTH 4 DAYS  Final   Report Status PENDING  Incomplete  Culture, blood (Routine X 2) w Reflex to ID Panel     Status: None (Preliminary result)   Collection Time: 02/02/16  6:44 PM  Result Value Ref Range Status  Specimen Description BLOOD BLOOD LEFT HAND  Final   Special Requests BOTTLES DRAWN AEROBIC AND ANAEROBIC 6CC  Final   Culture NO GROWTH 4 DAYS  Final   Report Status PENDING  Incomplete  Culture, bal-quantitative     Status: None (Preliminary result)   Collection Time: 02/05/16 11:35 AM  Result Value Ref Range Status   Specimen Description BRONCHIAL WASHINGS  Final   Special Requests Normal  Final   Gram Stain PENDING  Incomplete    Culture NO GROWTH < 24 HOURS  Final   Report Status PENDING  Incomplete  Culture, fungus without smear ([facility]-Only)     Status: None (Preliminary result)   Collection Time: 02/05/16 11:35 AM  Result Value Ref Range Status   Specimen Description BRONCHIAL WASHINGS  Final   Special Requests Normal  Final   Culture TOO YOUNG TO READ  Final   Report Status PENDING  Incomplete    Coagulation Studies: No results for input(s): LABPROT, INR in the last 72 hours.  Urinalysis: No results for input(s): COLORURINE, LABSPEC, PHURINE, GLUCOSEU, HGBUR, BILIRUBINUR, KETONESUR, PROTEINUR, UROBILINOGEN, NITRITE, LEUKOCYTESUR in the last 72 hours.  Invalid input(s): APPERANCEUR    Imaging: Dg Abd 1 View  02/05/2016  CLINICAL DATA:  Status post NG tube placement EXAM: ABDOMEN - 1 VIEW COMPARISON:  02/04/2016 FINDINGS: The nasogastric tube tip is in the projection of the left upper quadrant of the abdomen. The side port is well below the expected location of the GE junction. Cholecystectomy clips are identified in the right upper quadrant. There is gaseous distension of the large and small bowel loops. IMPRESSION: 1. Nasogastric tube is in the projection of the left upper quadrant of the abdomen. 2. Gaseous distension of the bowel loops. Electronically Signed   By: Kerby Moors M.D.   On: 02/05/2016 12:22   Portable Chest Xray  02/06/2016  CLINICAL DATA:  Respiratory failure. EXAM: PORTABLE CHEST 1 VIEW COMPARISON:  02/05/2016 FINDINGS: Endotracheal tube is 3.5 cm above the carina. Nasogastric tube tip in the gastric fundus. Persistent opacity in the retrocardiac space with probable left pleural fluid. Right lung is clear. Heart size is within normal limits and stable. 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 IMPRESSION: Stable densities at the left lung base. Findings are suggestive for consolidation and pleural fluid. Minimal change from the prior examination. Support apparatuses are appropriately positioned.  Electronically Signed   By: Markus Daft M.D.   On: 02/06/2016 07:22   Dg Chest Port 1 View  02/05/2016  CLINICAL DATA:  Endotracheal tube placement. EXAM: PORTABLE CHEST 1 VIEW COMPARISON:  02/04/2016 FINDINGS: The endotracheal the it is 3.5 cm above the carina. The NGT and is in the stomach. The pacer wires are stable. Interval re-expansion of the left lung with some post expansion edema. Persistent left basilar atelectasis and probable small left effusion. The right lung remains clear. IMPRESSION: The endotracheal tube is in good position, 3.5 cm above the carina. The NG tube is in good position in the stomach. Re-aeration/ Re-expansion of the left lung. Some residual left lower lobe atelectasis and small left effusion. Electronically Signed   By: Marijo Sanes M.D.   On: 02/05/2016 12:23     Medications:   . dextrose 100 mL/hr at 02/07/16 0500  . feeding supplement (VITAL HIGH PROTEIN) 1,000 mL (02/07/16 0500)   . anidulafungin  100 mg Intravenous Q24H  . antiseptic oral rinse  7 mL Mouth Rinse QID  . aspirin  324 mg Per  Tube Daily  . chlorhexidine gluconate (SAGE KIT)  15 mL Mouth Rinse BID  . free water  250 mL Per Tube 6 times per day  . insulin aspart  0-5 Units Subcutaneous QHS  . insulin aspart  0-9 Units Subcutaneous 4 times per day  . ipratropium-albuterol  3 mL Nebulization Q6H  . levETIRAcetam  750 mg Intravenous Q12H  . LORazepam  0.25 mg Intravenous Q12H  . pantoprazole (PROTONIX) IV  40 mg Intravenous Q24H  . piperacillin-tazobactam (ZOSYN)  IV  3.375 g Intravenous 3 times per day  . risperiDONE  0.5 mg Oral BID  . sodium chloride flush  3 mL Intravenous Q12H   acetaminophen, fentaNYL (SUBLIMAZE) injection, fentaNYL (SUBLIMAZE) injection, LORazepam, midazolam, midazolam, morphine injection, ondansetron **OR** ondansetron (ZOFRAN) IV  Assessment/ Plan:  60 y.o. black male with no significant past medical history, who was admitted to [facility] on 02/01/2016 for evaluation of  altered mental status. He was found have severe metabolic derangements upon admission. He was found to have a right middle lobe pneumonia.   1. Acute renal failure secondary to acute tubular necrosis, in the setting of pneumonia:  No known baseline creatinine.  Creatinine stable. Nonoliguric urine output.  - No acute indication for dialysis. Renally dose all medications.   2. Hypernatremia: Free water deficit of 4 litres. Now with pulmonary edema  - Continue D5W  - Increase free water flushes to 364m q4 hours   3. Metabolic Acidosis: CO2 23. With hypokalemia.   4. Right lower lobe bacterial pneumonia. Febrile overnight.  - appreciate pulmonary input. Status post bronchoscopy  5. Anemia with acute renal failure: hemoglobin 7.6 - has not received epo.  - low threshold for transfusion.     LOS: 6 ,  3/30/201710:28 AM

## 2016-02-07 NOTE — Progress Notes (Signed)
Nutrition Follow-up    INTERVENTION:   EN: recommend continuing current TF regimen with current goal rate, additional kcals from D5 taken into consideration at present. Meets 100% estimated calorie and protein needs per ASPEN    NUTRITION DIAGNOSIS:   Inadequate oral intake related to acute illness as evidenced by NPO status. Being addressed via TF  GOAL:   Provide needs based on ASPEN/SCCM guidelines  MONITOR:   Labs, Vent status, I & O's, Weight trends, TF tolerance  REASON FOR ASSESSMENT:   Ventilator, Consult Enteral/tube feeding initiation and management  ASSESSMENT:   60 yo male admitted with acute respiratory failure due to pneumonia, sepsis, AKI due to dehdration with severe hypernatremia  Hypernatremia persists, potassium supplemented  Patient is currently intubated on ventilator support MV: 11.7 L/min Temp (24hrs), Avg:99.1 F (37.3 C), Min:98.2 F (36.8 C), Max:99.9 F (37.7 C)  Diet Order:  Diet NPO time specified   EN: tolerating Vital High protein at rate of 65 ml/hr, free water increase to 300 mL q 4 hours per MD order  Digestive System: no signs of TF intolerance  Skin:   (multiple deep tissue injuries)  Last BM:  02/06/16 large loose BM    Recent Labs Lab 02/02/16 0251 02/03/16 0610  02/04/16 1006 02/04/16 1609 02/05/16 0643 02/06/16 0538  02/06/16 2046 02/07/16 0331 02/07/16 0852  NA 160* 152*  < >  --  150* 155* 155*  < > 157* 157* 155*  K 3.3* 3.8  < >  --  3.2* 3.5 3.1*  --   --  3.2*  --   CL >130* >130*  < >  --  129* >130* >130*  --   --  >130*  --   CO2 19* 19*  < >  --  18* 20* 20*  --   --  23  --   BUN 89* 70*  < >  --  41* 39* 39*  --   --  37*  --   CREATININE 2.99* 2.50*  < >  --  1.52* 1.65* 1.55*  --   --  1.45*  --   CALCIUM 7.9* 8.0*  < >  --  8.3* 8.7* 8.4*  --   --  8.4*  --   MG 2.3 2.4  --  2.4 2.4  --  2.7*  --   --   --   --   PHOS 1.5* 5.1*  --   --  2.6  --   --   --   --   --   --   GLUCOSE 230* 179*  < >   --  160* 142* 210*  --   --  196*  --   < > = values in this interval not displayed.  Glucose Profile:   Recent Labs  02/07/16 0239 02/07/16 0716 02/07/16 1140  GLUCAP 201* 200* 239*   Meds: D5 at 100 ml/hr (408 kcals), ss novolog  Height:   Ht Readings from Last 1 Encounters:  02/03/16 5\' 7"  (1.702 m)    Weight:   Wt Readings from Last 1 Encounters:  02/07/16 136 lb 11 oz (62 kg)    Filed Weights   02/03/16 2300 02/06/16 0500 02/07/16 0500  Weight: 152 lb 1.9 oz (69 kg) 133 lb 13.1 oz (60.7 kg) 136 lb 11 oz (62 kg)    BMI:  Body mass index is 21.4 kg/(m^2).  Estimated Nutritional Needs:   Kcal:  1783 kcals (BEE 1393, Tmax: 37.7.  Ve: 11.7) using wt of 62 kg  Protein:  92-122 g (1.5-2.0 g/kg)   Fluid:  1.8 L  EDUCATION NEEDS:   Education needs no appropriate at this time  [place], [place], [place] [phone] Pager  [phone] Weekend/On-Call Pager

## 2016-02-08 ENCOUNTER — Inpatient Hospital Stay: Payer: Medicare Other

## 2016-02-08 LAB — CBC
HCT: 21 % — ABNORMAL LOW (ref 40.0–52.0)
Hemoglobin: 6.9 g/dL — ABNORMAL LOW (ref 13.0–18.0)
MCH: 31 pg (ref 26.0–34.0)
MCHC: 32.9 g/dL (ref 32.0–36.0)
MCV: 94.2 fL (ref 80.0–100.0)
PLATELETS: 121 10*3/uL — AB (ref 150–440)
RBC: 2.23 MIL/uL — ABNORMAL LOW (ref 4.40–5.90)
RDW: 15.6 % — AB (ref 11.5–14.5)
WBC: 5.7 10*3/uL (ref 3.8–10.6)

## 2016-02-08 LAB — GLUCOSE, CAPILLARY
GLUCOSE-CAPILLARY: 204 mg/dL — AB (ref 65–99)
GLUCOSE-CAPILLARY: 198 mg/dL — AB (ref 65–99)
Glucose-Capillary: 135 mg/dL — ABNORMAL HIGH (ref 65–99)
Glucose-Capillary: 187 mg/dL — ABNORMAL HIGH (ref 65–99)
Glucose-Capillary: 210 mg/dL — ABNORMAL HIGH (ref 65–99)
Glucose-Capillary: 215 mg/dL — ABNORMAL HIGH (ref 65–99)
Glucose-Capillary: 219 mg/dL — ABNORMAL HIGH (ref 65–99)

## 2016-02-08 LAB — BASIC METABOLIC PANEL
BUN: 30 mg/dL — ABNORMAL HIGH (ref 6–20)
CALCIUM: 8.5 mg/dL — AB (ref 8.9–10.3)
CO2: 22 mmol/L (ref 22–32)
CREATININE: 1.13 mg/dL (ref 0.61–1.24)
Chloride: >130 mmol/L (ref 101–111)
GFR calc Af Amer: >60 mL/min (ref >60)
GFR calc non Af Amer: >60 mL/min (ref >60)
Glucose, Bld: 214 mg/dL — ABNORMAL HIGH (ref 65–99)
Potassium: 3.5 mmol/L (ref 3.5–5.1)
Sodium: 152 mmol/L — ABNORMAL HIGH (ref 135–145)

## 2016-02-08 LAB — SODIUM
Sodium: 152 mmol/L — ABNORMAL HIGH (ref 135–145)
Sodium: 151 mmol/L — ABNORMAL HIGH (ref 135–145)
Sodium: 148 mmol/L — ABNORMAL HIGH (ref 135–145)

## 2016-02-08 LAB — MAGNESIUM: Magnesium: 2.2 mg/dL (ref 1.7–2.4)

## 2016-02-08 MED ORDER — FENTANYL CITRATE (PF) 100 MCG/2ML IJ SOLN
25.0000 ug | INTRAMUSCULAR | Status: DC | PRN
  Administered 2016-02-09: 25 ug via INTRAVENOUS
  Filled 2016-02-08: qty 2

## 2016-02-08 MED ORDER — FREE WATER
200.0000 mL | Status: DC
  Administered 2016-02-08 – 2016-02-09 (×5): 200 mL

## 2016-02-08 MED ORDER — MIDAZOLAM HCL 2 MG/2ML IJ SOLN: 1.0000 mg | INTRAMUSCULAR | Status: DC | PRN

## 2016-02-08 MED ORDER — INSULIN ASPART 100 UNIT/ML ~~LOC~~ SOLN
0.0000 [IU] | SUBCUTANEOUS | Status: DC
  Administered 2016-02-08: 3 [IU] via SUBCUTANEOUS
  Administered 2016-02-08: 2 [IU] via SUBCUTANEOUS
  Administered 2016-02-08: 3 [IU] via SUBCUTANEOUS
  Administered 2016-02-09: 2 [IU] via SUBCUTANEOUS
  Administered 2016-02-09: 3 [IU] via SUBCUTANEOUS
  Filled 2016-02-08: qty 3
  Filled 2016-02-08: qty 2
  Filled 2016-02-08: qty 3
  Filled 2016-02-08: qty 2
  Filled 2016-02-08: qty 3

## 2016-02-08 MED ORDER — POTASSIUM CHLORIDE 20 MEQ/15ML (10%) PO SOLN
40.0000 meq | Freq: Once | ORAL | Status: AC
  Administered 2016-02-08: 40 meq
  Filled 2016-02-08: qty 30

## 2016-02-08 MED ORDER — POTASSIUM CHLORIDE 20 MEQ PO PACK
40.0000 meq | PACK | Freq: Once | ORAL | Status: AC
  Administered 2016-02-08: 40 meq via ORAL
  Filled 2016-02-08: qty 2

## 2016-02-08 MED ORDER — VITAL HIGH PROTEIN PO LIQD
1000.0000 mL | ORAL | Status: DC
  Administered 2016-02-08 – 2016-02-09 (×2): 1000 mL

## 2016-02-08 MED ORDER — RISPERIDONE 1 MG PO TBDP: 1.0000 mg | ORAL_TABLET | Freq: Every day | ORAL | Status: DC

## 2016-02-08 MED ORDER — FREE WATER
400.0000 mL | Status: DC
Start: 2016-02-08 — End: 2016-02-08
  Administered 2016-02-08: 400 mL

## 2016-02-08 MED ORDER — SODIUM CHLORIDE 0.9 % IV SOLN
500.0000 mg | Freq: Two times a day (BID) | INTRAVENOUS | Status: DC
  Administered 2016-02-08 – 2016-02-12 (×8): 500 mg via INTRAVENOUS
  Filled 2016-02-08 (×10): qty 5

## 2016-02-08 MED ORDER — PANTOPRAZOLE SODIUM 40 MG PO PACK
40.0000 mg | PACK | Freq: Every day | ORAL | Status: DC
  Administered 2016-02-08: 40 mg
  Filled 2016-02-08: qty 20

## 2016-02-08 NOTE — Care Management (Signed)
Intensivisit anticipates patient will be able to wean off vent.  Tube feedings

## 2016-02-08 NOTE — Progress Notes (Signed)
[facility]  ROUNDING NOTE   Subjective:   Na 152 [phone]   Objective:  Vital signs in last 24 hours:  Temp:  [98.2 F (36.8 C)-99.7 F (37.6 C)] 98.8 F (37.1 C) (03/31 0800) Pulse Rate:  [69-88] 69 (03/31 0800) Resp:  [13-25] 13 (03/31 0800) BP: (89-112)/(46-77) 103/62 mmHg (03/31 0800) SpO2:  [98 %-100 %] 100 % (03/31 0816) FiO2 (%):  [28 %] 28 % (03/31 0816) Weight:  [59.3 kg (130 lb 11.7 oz)] 59.3 kg (130 lb 11.7 oz) (03/31 0510)  Weight change: -2.7 kg (-5 lb 15.2 oz) Filed Weights   02/06/16 0500 02/07/16 0500 02/08/16 0510  Weight: 60.7 kg (133 lb 13.1 oz) 62 kg (136 lb 11 oz) 59.3 kg (130 lb 11.7 oz)    Intake/Output: I/O last 3 completed shifts: In: 9230.8 [I.V.:3778.3; Other:300; NG/GT:4815; IV Piggyback:337.5] Out: 7322 [Urine:4250; Stool:2]   Intake/Output this shift:     Physical Exam: General: Critically ill appearing  Head: Normocephalic, atraumatic. +ETT +OGT  Eyes: Anicteric  Neck: Supple, trachea midline  Lungs:  Diminished bilaterally, PRVC 28% Pressure support  Heart: regular  Abdomen:  Soft, nontender, BS present   Extremities: no peripheral edema.  Neurologic: Sedated, intubated  Skin: No lesions       Basic Metabolic Panel:  Recent Labs Lab 02/02/16 0251 02/03/16 0610  02/04/16 1006 02/04/16 1609 02/05/16 0254 02/06/16 0538  02/07/16 0331 02/07/16 2706 02/07/16 1452 02/07/16 2044 02/08/16 0307 02/08/16 0629  NA 160* 152*  < >  --  150* 155* 155*  < > 157* 155* 152* 153* 152* 152*  K 3.3* 3.8  < >  --  3.2* 3.5 3.1*  --  3.2*  --   --   --   --  3.5  CL >130* >130*  < >  --  129* >130* >130*  --  >130*  --   --   --   --  >130*  CO2 19* 19*  < >  --  18* 20* 20*  --  23  --   --   --   --  22  GLUCOSE 230* 179*  < >  --  160* 142* 210*  --  196*  --   --   --   --  214*  BUN 89* 70*  < >  --  41* 39* 39*  --  37*  --   --   --   --  30*  CREATININE 2.99* 2.50*  < >  --  1.52* 1.65* 1.55*  --  1.45*  --   --    --   --  1.13  CALCIUM 7.9* 8.0*  < >  --  8.3* 8.7* 8.4*  --  8.4*  --   --   --   --  8.5*  MG 2.3 2.4  --  2.4 2.4  --  2.7*  --   --   --   --   --   --  2.2  PHOS 1.5* 5.1*  --   --  2.6  --   --   --   --   --   --   --   --   --   < > = values in this interval not displayed.  Liver Function Tests:  Recent Labs Lab 02/01/16 0936  AST 56*  ALT 47  ALKPHOS 79  BILITOT 1.1  PROT 7.1  ALBUMIN 3.5   No results for input(s):  LIPASE, AMYLASE in the last 168 hours. No results for input(s): AMMONIA in the last 168 hours.  CBC:  Recent Labs Lab 02/01/16 0936  02/03/16 0610 02/04/16 0531 02/05/16 0643 02/06/16 0538 02/07/16 0331 02/08/16 0629  WBC 5.4  < > 9.7 12.0* 7.0 4.5 4.8 5.7  NEUTROABS 4.5  --  8.2*  --   --   --   --   --   HGB 11.5*  < > 9.1* 9.0* 9.6* 8.9* 7.6* 6.9*  HCT 35.2*  < > 27.4* 28.5* 28.9* 26.6* 22.9* 21.0*  MCV 94.8  < > 95.0 96.0 93.6 95.3 94.6 94.2  PLT 141*  < > 87* 90* 84* 92* 105* 121*  < > = values in this interval not displayed.  Cardiac Enzymes:  Recent Labs Lab 02/01/16 0936 02/03/16 2214 02/04/16 0531 02/04/16 1006 02/04/16 1609  TROPONINI <0.03 0.10* 0.18* 0.09* 0.17*    BNP: Invalid input(s): POCBNP  CBG:  Recent Labs Lab 02/07/16 1140 02/07/16 1631 02/08/16 0015 02/08/16 0651 02/08/16 0727  GLUCAP 239* 199* 219* 210* 204*    Microbiology: Results for orders placed or performed during the hospital encounter of 02/01/16  Urine culture     Status: None   Collection Time: 02/01/16  9:36 AM  Result Value Ref Range Status   Specimen Description URINE, RANDOM  Final   Special Requests NONE  Final   Culture NO GROWTH 2 DAYS  Final   Report Status 02/03/2016 FINAL  Final  Culture, blood (routine x 2)     Status: None   Collection Time: 02/01/16  9:36 AM  Result Value Ref Range Status   Specimen Description BLOOD LEFT HAND  Final   Special Requests BOTTLES DRAWN AEROBIC AND ANAEROBIC  1CC  Final   Culture NO GROWTH 5  DAYS  Final   Report Status 02/06/2016 FINAL  Final  Culture, blood (routine x 2)     Status: None   Collection Time: 02/01/16 10:00 AM  Result Value Ref Range Status   Specimen Description BLOOD RIGHT HAND  Final   Special Requests BOTTLES DRAWN AEROBIC AND ANAEROBIC  1CC  Final   Culture NO GROWTH 5 DAYS  Final   Report Status 02/06/2016 FINAL  Final  MRSA PCR Screening     Status: None   Collection Time: 02/01/16  5:00 PM  Result Value Ref Range Status   MRSA by PCR NEGATIVE NEGATIVE Final    Comment:        The GeneXpert MRSA Assay (FDA approved for NASAL specimens only), is one component of a comprehensive MRSA colonization surveillance program. It is not intended to diagnose MRSA infection nor to guide or monitor treatment for MRSA infections.   Urine culture     Status: None   Collection Time: 02/02/16  5:56 PM  Result Value Ref Range Status   Specimen Description URINE, RANDOM  Final   Special Requests NONE  Final   Culture NO GROWTH 2 DAYS  Final   Report Status 02/04/2016 FINAL  Final  Culture, blood (Routine X 2) w Reflex to ID Panel     Status: None   Collection Time: 02/02/16  6:30 PM  Result Value Ref Range Status   Specimen Description BLOOD RIGHT HAND  Final   Special Requests   Final    BOTTLES DRAWN AEROBIC AND ANAEROBIC  AER 4CC ANA 10CC   Culture NO GROWTH 5 DAYS  Final   Report Status 02/07/2016 FINAL  Final  Culture,  blood (Routine X 2) w Reflex to ID Panel     Status: None   Collection Time: 02/02/16  6:44 PM  Result Value Ref Range Status   Specimen Description BLOOD BLOOD LEFT HAND  Final   Special Requests BOTTLES DRAWN AEROBIC AND ANAEROBIC 6CC  Final   Culture NO GROWTH 5 DAYS  Final   Report Status 02/07/2016 FINAL  Final  Culture, bal-quantitative     Status: None (Preliminary result)   Collection Time: 02/05/16 11:35 AM  Result Value Ref Range Status   Specimen Description BRONCHIAL WASHINGS  Final   Special Requests Normal  Final    Gram Stain MODERATE WBC SEEN NO ORGANISMS SEEN   Final   Culture HOLDING FOR POSSIBLE PATHOGEN  Final   Report Status PENDING  Incomplete  Culture, fungus without smear ([facility]-Only)     Status: None (Preliminary result)   Collection Time: 02/05/16 11:35 AM  Result Value Ref Range Status   Specimen Description BRONCHIAL WASHINGS  Final   Special Requests Normal  Final   Culture HOLDING FOR POSSIBLE PATHOGEN  Final   Report Status PENDING  Incomplete    Coagulation Studies: No results for input(s): LABPROT, INR in the last 72 hours.  Urinalysis: No results for input(s): COLORURINE, LABSPEC, PHURINE, GLUCOSEU, HGBUR, BILIRUBINUR, KETONESUR, PROTEINUR, UROBILINOGEN, NITRITE, LEUKOCYTESUR in the last 72 hours.  Invalid input(s): APPERANCEUR    Imaging: Dg Chest Port 1 View  02/08/2016  CLINICAL DATA:  Hypoxia EXAM: PORTABLE CHEST 1 VIEW COMPARISON:  February 06, 2016 FINDINGS: Endotracheal tube tip is 3.9 cm above the carina. Nasogastric tube tip and side port are below the diaphragm. 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 No pneumothorax. There is persistent left lower lobe consolidation with small left pleural effusion. The right lung is clear. Heart is upper normal in size with pulmonary vascular within normal limits. No adenopathy. IMPRESSION: Tube positions as described without pneumothorax. Persistent left lower lobe consolidation with small left effusion. Right lung clear. No new opacity. No change in cardiac silhouette. Electronically Signed   By: Lowella Grip III M.D.   On: 02/08/2016 07:27     Medications:   . dextrose 100 mL/hr at 02/08/16 0600  . dextrose 50 mL/hr at 02/08/16 0600  . feeding supplement (VITAL HIGH PROTEIN) 1,000 mL (02/08/16 0600)   . anidulafungin  100 mg Intravenous Q24H  . antiseptic oral rinse  7 mL Mouth Rinse QID  . aspirin  324 mg Per Tube Daily  . chlorhexidine gluconate (SAGE KIT)  15 mL Mouth Rinse BID  . free water  300 mL Per  Tube 6 times per day  . insulin aspart  0-5 Units Subcutaneous QHS  . insulin aspart  0-9 Units Subcutaneous 4 times per day  . ipratropium-albuterol  3 mL Nebulization Q6H  . levETIRAcetam  750 mg Intravenous Q12H  . LORazepam  0.25 mg Intravenous Q12H  . pantoprazole (PROTONIX) IV  40 mg Intravenous Q24H  . piperacillin-tazobactam (ZOSYN)  IV  3.375 g Intravenous 3 times per day  . potassium chloride  40 mEq Oral Once  . risperiDONE  0.5 mg Oral BID  . sodium chloride flush  3 mL Intravenous Q12H   acetaminophen, fentaNYL (SUBLIMAZE) injection, fentaNYL (SUBLIMAZE) injection, LORazepam, midazolam, midazolam, morphine injection, ondansetron **OR** ondansetron (ZOFRAN) IV  Assessment/ Plan:  60 y.o. black male with no significant past medical history, who was admitted to [facility] on 02/01/2016 for evaluation of altered mental status. He was found have  severe metabolic derangements upon admission. He was found to have a right middle lobe pneumonia.   1. Acute renal failure secondary to acute tubular necrosis, in the setting of pneumonia:  No known baseline creatinine.  Creatinine improved. Nonoliguric urine output.  - No acute indication for dialysis. Renally dose all medications.   2. Hypernatremia: Free water deficit of 3 litres. Now with pulmonary edema  - Continue D5W  - Increase free water flushes to 419m q4 hours   3. Metabolic Acidosis: CO2 22. With hypokalemia 3.5  4. Right lower lobe bacterial pneumonia.  - anidulafungin, zosyn  - appreciate pulmonary input. Status post bronchoscopy  5. Anemia with acute renal failure: hemoglobin 6.9 - has not received epo.  - low threshold for transfusion.     LOS: 7 Dinorah Masullo 3/31/20178:59 AM

## 2016-02-08 NOTE — Consult Note (Signed)
MEDICATION RELATED CONSULT NOTE - FOLLOW UP  Pharmacy Consult for electrolyte management Indication: hypokalemia, hypomagnesemia  No Known Allergies  Patient Measurements: Height: 5\' 7"  (170.2 cm) Weight: 130 lb 11.7 oz (59.3 kg) IBW/kg (Calculated) : 66.1  Vital Signs: Temp: 98.8 F (37.1 C) (03/31 0800) BP: 103/62 mmHg (03/31 0800) Pulse Rate: 69 (03/31 0800) Intake/Output from previous day: 03/30 0701 - 03/31 0700 In: 6450.8 [I.V.:2578.3; NG/GT:3285; IV Piggyback:287.5] Out: [id number] [Urine:3150; Stool:2] Intake/Output from this shift:    Assessment: [name] is a 60yo male admitted for PNA. Pharmacy consulted to manage electrolytes in this patient.  K= 3.5, Mg= 2.2  Plan:  Potassium replaced with 40 meq po once.    Will recheck with AM labs.   [name] D 02/08/2016,8:17 AM

## 2016-02-08 NOTE — Progress Notes (Signed)
[facility] [place] NAME: Jaydis William    MR#:  [id number]  DATE OF BIRTH:  Jan 08, 1956  SUBJECTIVE:  CHIEF COMPLAINT:  Remains on vent off sedation since this morning not responding   REVIEW OF SYSTEMS:  Review of systems unobtainable as patient is intubated  DRUG ALLERGIES:  No Known Allergies  VITALS:  Blood pressure 103/62, pulse 69, temperature 98.8 F (37.1 C), temperature source Core (Comment), resp. rate 13, height 5\' 7"  (1.702 m), weight 59.3 kg (130 lb 11.7 oz), SpO2 100 %.  PHYSICAL EXAMINATION:  GENERAL:  60 y.o.-year-old patient lying in the bed critically  Ill on the ventilator nd, reactive to light and accommodation. No scleral icterus.  HEENT: Head atraumatic, normocephalic. Oropharynx-ET tube intact NECK:  Supple, no jugular venous distention. No thyroid enlargement, no tenderness.  LUNGS: Diminished breath sounds bilaterally without any rales rhonchi wheezing  CARDIOVASCULAR: S1, S2 normal. No murmurs, rubs, or gallops.  ABDOMEN: Soft, nontender, nondistended. Bowel sounds present. No organomegaly or mass.  EXTREMITIES: No pedal edema, cyanosis, or clubbing.  NEUROLOGIC:  intubated and sedated psych-intubated    CBC  Recent Labs Lab 02/08/16 0629  WBC 5.7  HGB 6.9*  HCT 21.0*  PLT 121*   ------------------------------------------------------------------------------------------------------------------  Chemistries   Recent Labs Lab 02/08/16 0629 02/08/16 0903  NA 152* 151*  K 3.5  --   CL >130*  --   CO2 22  --   GLUCOSE 214*  --   BUN 30*  --   CREATININE 1.13  --   CALCIUM 8.5*  --   MG 2.2  --    ------------------------------------------------------------------------------------------------------------------  Cardiac Enzymes  Recent Labs Lab 02/04/16 1609  TROPONINI 0.17*    ------------------------------------------------------------------------------------------------------------------  RADIOLOGY:  Dg Chest Port 1 View  02/08/2016  CLINICAL DATA:  Hypoxia EXAM: PORTABLE CHEST 1 VIEW COMPARISON:  February 06, 2016 FINDINGS: Endotracheal tube tip is 3.9 cm above the carina. Nasogastric tube tip and side port are below the diaphragm. 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 No pneumothorax. There is persistent left lower lobe consolidation with small left pleural effusion. The right lung is clear. Heart is upper normal in size with pulmonary vascular within normal limits. No adenopathy. IMPRESSION: Tube positions as described without pneumothorax. Persistent left lower lobe consolidation with small left effusion. Right lung clear. No new opacity. No change in cardiac silhouette. Electronically Signed   By: Lowella Grip III M.D.   On: 02/08/2016 07:27    EKG:   Orders placed or performed during the hospital encounter of 02/01/16  . EKG 12-Lead  . EKG 12-Lead  . EKG 12-Lead  . EKG 12-Lead  . EKG 12-Lead  . EKG 12-Lead  . EKG 12-Lead  . EKG 12-Lead  . ED EKG  . ED EKG  . ED EKG  . ED EKG  . EKG 12-Lead  . EKG 12-Lead    ASSESSMENT AND PLAN:   #1. Acute respiratory failure secondary to pneumonia with sepsis left lung collapse  from mucous plugging status post bronchoscopy Patient is intubated and bronchoscopies done 02/05/2016- reexpansion of the left lung noticed Appreciate pulmonology recommendations continue patient on broad-spectrum antibiotic therapy,  per pulmonary concern for fungal infection continue Eraxis  Cultures pending  #3. Hypernatremia,   Continue D5W containing fluid as well as free water Appreciate nephrology input and follow-up Sodium improved  #4. Metabolic encephalopathy, secondary to sepsis and acute renal failure Is not responding this morning.  May need EEG  #5. Acute renal failure, separate acute TUBular  necrosis   Foley catheter is in, monitor intake and output closely  renal function improved   #6. Thrombocytopenia, Continue to monitor he is off Lovenox therapy  #7 failure to thrive  Palliative care consult is pending Currently patient is DO NOT RESUSCITATE  Prognosis poor   All the records are reviewed and case discussed with Care Management/Social Workerr. Management plans discussed with the patient, family and they are in agreement.  CODE STATUS: DNR  TOTAL CRITICAL CARE TIME TAKING CARE OF THIS PATIENT: 35 minutes.      Dustin Flock M.D on 02/08/2016 at 10:41 AM  Between 7am to 6pm - Pager - [phone] After 6pm go to [email or web address] - password [email or web address] [facility]  Office  [phone]  CC: Primary care physician; Vista Mink, FNP

## 2016-02-08 NOTE — Progress Notes (Signed)
Remains intubated Tolerating PS 10 cm H2O Depressed LOC persists  Filed Vitals:   02/08/16 1300 02/08/16 1317 02/08/16 1400 02/08/16 1513  BP: 97/63  100/65   Pulse: 69  71   Temp: 98.6 F (37 C)  98.6 F (37 C)   TempSrc:      Resp: 21  12   Height:      Weight:      SpO2: 100% 100% 100% 100%   RASS -3, not F/C HEENT WNL No JVD noted No wheezes Reg, soft systolic M NABS, soft, +BS Ext warm without edema   BMP Latest Ref Rng 02/08/2016 02/08/2016 02/08/2016  Glucose 65 - 99 mg/dL - 214(H) -  BUN 6 - 20 mg/dL - 30(H) -  Creatinine 0.61 - 1.24 mg/dL - 1.13 -  Sodium 135 - 145 mmol/L 151(H) 152(H) 152(H)  Potassium 3.5 - 5.1 mmol/L - 3.5 -  Chloride 101 - 111 mmol/L - >130(HH) -  CO2 22 - 32 mmol/L - 22 -  Calcium 8.9 - 10.3 mg/dL - 8.5(L) -    CBC Latest Ref Rng 02/08/2016 02/07/2016 02/06/2016  WBC 3.8 - 10.6 K/uL 5.7 4.8 4.5  Hemoglobin 13.0 - 18.0 g/dL 6.9(L) 7.6(L) 8.9(L)  Hematocrit 40.0 - 52.0 % 21.0(L) 22.9(L) 26.6(L)  Platelets 150 - 440 K/uL 121(L) 105(L) 92(L)    CXR: LLL infiltrate/atx  Results for orders placed or performed during the hospital encounter of 02/01/16  Urine culture     Status: None   Collection Time: 02/01/16  9:36 AM  Result Value Ref Range Status   Specimen Description URINE, RANDOM  Final   Special Requests NONE  Final   Culture NO GROWTH 2 DAYS  Final   Report Status 02/03/2016 FINAL  Final  Culture, blood (routine x 2)     Status: None   Collection Time: 02/01/16  9:36 AM  Result Value Ref Range Status   Specimen Description BLOOD LEFT HAND  Final   Special Requests BOTTLES DRAWN AEROBIC AND ANAEROBIC  1CC  Final   Culture NO GROWTH 5 DAYS  Final   Report Status 02/06/2016 FINAL  Final  Culture, blood (routine x 2)     Status: None   Collection Time: 02/01/16 10:00 AM  Result Value Ref Range Status   Specimen Description BLOOD RIGHT HAND  Final   Special Requests BOTTLES DRAWN AEROBIC AND ANAEROBIC  1CC  Final   Culture NO  GROWTH 5 DAYS  Final   Report Status 02/06/2016 FINAL  Final  MRSA PCR Screening     Status: None   Collection Time: 02/01/16  5:00 PM  Result Value Ref Range Status   MRSA by PCR NEGATIVE NEGATIVE Final    Comment:        The GeneXpert MRSA Assay (FDA approved for NASAL specimens only), is one component of a comprehensive MRSA colonization surveillance program. It is not intended to diagnose MRSA infection nor to guide or monitor treatment for MRSA infections.   Urine culture     Status: None   Collection Time: 02/02/16  5:56 PM  Result Value Ref Range Status   Specimen Description URINE, RANDOM  Final   Special Requests NONE  Final   Culture NO GROWTH 2 DAYS  Final   Report Status 02/04/2016 FINAL  Final  Culture, blood (Routine X 2) w Reflex to ID Panel     Status: None   Collection Time: 02/02/16  6:30 PM  Result Value Ref Range Status  Specimen Description BLOOD RIGHT HAND  Final   Special Requests   Final    BOTTLES DRAWN AEROBIC AND ANAEROBIC  AER 4CC ANA 10CC   Culture NO GROWTH 5 DAYS  Final   Report Status 02/07/2016 FINAL  Final  Culture, blood (Routine X 2) w Reflex to ID Panel     Status: None   Collection Time: 02/02/16  6:44 PM  Result Value Ref Range Status   Specimen Description BLOOD BLOOD LEFT HAND  Final   Special Requests BOTTLES DRAWN AEROBIC AND ANAEROBIC 6CC  Final   Culture NO GROWTH 5 DAYS  Final   Report Status 02/07/2016 FINAL  Final  Culture, bal-quantitative     Status: None (Preliminary result)   Collection Time: 02/05/16 11:35 AM  Result Value Ref Range Status   Specimen Description BRONCHIAL WASHINGS  Final   Special Requests Normal  Final   Gram Stain MODERATE WBC SEEN NO ORGANISMS SEEN   Final   Culture MODERATE GROWTH YEAST IDENTIFICATION TO FOLLOW   Final   Report Status PENDING  Incomplete  Culture, fungus without smear ([facility]-Only)     Status: None (Preliminary result)   Collection Time: 02/05/16 11:35 AM  Result Value Ref  Range Status   Specimen Description BRONCHIAL WASHINGS  Final   Special Requests Normal  Final   Culture HOLDING FOR POSSIBLE PATHOGEN  Final   Report Status PENDING  Incomplete   Anti-infectives    Start     Dose/Rate Route Frequency Ordered Stop   02/07/16 1300  anidulafungin (ERAXIS) 100 mg in sodium chloride 0.9 % 100 mL IVPB     100 mg over 90 Minutes Intravenous Every 24 hours 02/07/16 0901     02/06/16 1130  anidulafungin (ERAXIS) 100 mg in sodium chloride 0.9 % 100 mL IVPB  Status:  Discontinued     100 mg over 90 Minutes Intravenous Every 24 hours 02/05/16 1128 02/07/16 0901   02/05/16 1130  anidulafungin (ERAXIS) 200 mg in sodium chloride 0.9 % 200 mL IVPB     200 mg over 180 Minutes Intravenous  Once 02/05/16 1128 02/05/16 1517   02/02/16 1000  azithromycin (ZITHROMAX) 500 mg in dextrose 5 % 250 mL IVPB  Status:  Discontinued     500 mg 250 mL/hr over 60 Minutes Intravenous Every 24 hours 02/01/16 1413 02/05/16 1124   02/02/16 0400  vancomycin (VANCOCIN) IVPB 750 mg/150 ml premix  Status:  Discontinued     750 mg 150 mL/hr over 60 Minutes Intravenous Every 24 hours 02/01/16 1557 02/03/16 1207   02/01/16 1600  vancomycin (VANCOCIN) IVPB 1000 mg/200 mL premix     1,000 mg 200 mL/hr over 60 Minutes Intravenous STAT 02/01/16 1554 02/01/16 1755   02/01/16 1600  piperacillin-tazobactam (ZOSYN) IVPB 3.375 g     3.375 g 12.5 mL/hr over 240 Minutes Intravenous 3 times per day 02/01/16 1555     02/01/16 1315  piperacillin-tazobactam (ZOSYN) IVPB 3.375 g  Status:  Discontinued     3.375 g 100 mL/hr over 30 Minutes Intravenous  Once 02/01/16 1308 02/01/16 1555   02/01/16 1315  vancomycin (VANCOCIN) IVPB 1000 mg/200 mL premix  Status:  Discontinued     1,000 mg 200 mL/hr over 60 Minutes Intravenous  Once 02/01/16 1308 02/01/16 1554   02/01/16 1045  cefTRIAXone (ROCEPHIN) 1 g in dextrose 5 % 50 mL IVPB     1 g 100 mL/hr over 30 Minutes Intravenous  Once 02/01/16 1038 02/01/16 1123  02/01/16 1045  azithromycin (ZITHROMAX) 500 mg in dextrose 5 % 250 mL IVPB     500 mg 250 mL/hr over 60 Minutes Intravenous  Once 02/01/16 1038 02/01/16 1145     IMPRESSION: VDRF Pneumonia Mucus plugging Hypotension, resolved AKI, improving Severe hypernatremia, improving Acute metabolic encephalopathy H/O multiple CVAs  PLAN/REC: Cont vent support - settings reviewed and/or adjusted Wean in PSV as tolerated Cont vent bundle Daily SBT if/when meets criteria MAP goal > 65 mmHg Monitor BMET intermittently Monitor I/Os Correct electrolytes as indicated Monitor temp, WBC count Micro and abx as above DVT px: SQ heparin Monitor CBC intermittently Transfuse per usual guidelines Minimize sedation  I will meet with family 04/01 and encourage that we optimize respiratory and neurological status and then pursue one way extubation  CCM time: 35 mins The above time includes time spent in consultation with patient and/or family members and reviewing care plan on multidisciplinary rounds  Merton Border, MD PCCM service Mobile [phone] Pager [phone]

## 2016-02-08 NOTE — Progress Notes (Signed)
Inpatient Diabetes Program Recommendations  AACE/ADA: New Consensus Statement on Inpatient Glycemic Control (2015)  Target Ranges:  Prepandial:   less than 140 mg/dL      Peak postprandial:   less than 180 mg/dL (1-2 hours)      Critically ill patients:  140 - 180 mg/dL   Review of Glycemic Control   Results for HUW, DEMUTH (MRN [id number]) as of 02/08/2016 09:05  Ref. Range 02/07/2016 11:40 02/07/2016 16:31 02/08/2016 00:15 02/08/2016 06:51 02/08/2016 07:27  Glucose-Capillary Latest Ref Range: 65-99 mg/dL 239 (H) 199 (H) 219 (H) 210 (H) 204 (H)    Diabetes history: unknown Outpatient Diabetes medications: none Current orders for Inpatient glycemic control: Novolog 0-9 units q6h, Novolog 0-5 units qhs  Inpatient Diabetes Program Recommendations: Please stop Novolog 0-5 units qhs since Novolog 0-9 units has been ordered q6h.   Consider starting low dose basal insulin, Lantus 7 units qday. (0.1 unit/kg)  Gentry Fitz, RN, [place], [place], CDE Diabetes Coordinator Inpatient Diabetes Program  [phone] (Team Pager) [phone] ([place]) 02/08/2016 9:08 AM

## 2016-02-08 NOTE — Progress Notes (Signed)
Spoke w/Magdeline, NP about VTE prophylaxis.  She will address.

## 2016-02-08 NOTE — Progress Notes (Signed)
Nutrition Follow-up    INTERVENTION:   EN: calories from D5 taken into consideration, recommend decreasing TF to rate of 55 ml/hr, continues to meet 100% calorie and protein needs estimated vis ASPEN guidelines  NUTRITION DIAGNOSIS:   Inadequate oral intake related to acute illness as evidenced by NPO status. Being addressed via TF  GOAL:   Provide needs based on ASPEN/SCCM guidelines  MONITOR:   Labs, Vent status, I & O's, Weight trends, TF tolerance  REASON FOR ASSESSMENT:   Ventilator, Consult Enteral/tube feeding initiation and management  ASSESSMENT:   60 yo male admitted with acute respiratory failure due to pneumonia, sepsis, AKI due to dehdration with severe hypernatremia   Hypernatremia persists, on free water flushes and D5 infusion  Patient is currently intubated on ventilator support, off sedation this AM, poorly responsive MV: 10.1 L/min Temp (24hrs), Avg:99.1 F (37.3 C), Min:98.2 F (36.8 C), Max:99.7 F (37.6 C)  Diet Order:   NPO  EN: tolerating Vital High Protein at rate of 65 ml/hr, free water 200 mL q 4 hours  Skin:  Reviewed, no issues  Last BM:  02/07/16 large loose BM    Recent Labs Lab 02/02/16 0251 02/03/16 0610  02/04/16 1609  02/06/16 0538  02/07/16 0331  02/08/16 0307 02/08/16 0629 02/08/16 0903  NA 160* 152*  < > 150*  < > 155*  < > 157*  < > 152* 152* 151*  K 3.3* 3.8  < > 3.2*  < > 3.1*  --  3.2*  --   --  3.5  --   CL >130* >130*  < > 129*  < > >130*  --  >130*  --   --  >130*  --   CO2 19* 19*  < > 18*  < > 20*  --  23  --   --  22  --   BUN 89* 70*  < > 41*  < > 39*  --  37*  --   --  30*  --   CREATININE 2.99* 2.50*  < > 1.52*  < > 1.55*  --  1.45*  --   --  1.13  --   CALCIUM 7.9* 8.0*  < > 8.3*  < > 8.4*  --  8.4*  --   --  8.5*  --   MG 2.3 2.4  < > 2.4  --  2.7*  --   --   --   --  2.2  --   PHOS 1.5* 5.1*  --  2.6  --   --   --   --   --   --   --   --   GLUCOSE 230* 179*  < > 160*  < > 210*  --  196*  --   --   214*  --   < > = values in this interval not displayed.  Glucose Profile:   Recent Labs  02/08/16 0651 02/08/16 0727 02/08/16 1126  GLUCAP 210* 204* 215*   Meds: ss novolog, D5 at 100 ml/hr (408 kcals)  Height:   Ht Readings from Last 1 Encounters:  02/03/16 5\' 7"  (1.702 m)    Weight:   Wt Readings from Last 1 Encounters:  02/08/16 130 lb 11.7 oz (59.3 kg)    Filed Weights   02/06/16 0500 02/07/16 0500 02/08/16 0510  Weight: 133 lb 13.1 oz (60.7 kg) 136 lb 11 oz (62 kg) 130 lb 11.7 oz (59.3 kg)    BMI:  Body mass index is 20.47 kg/(m^2).  Estimated Nutritional Needs:   Kcal:  1709 kcals (Ve: 10.1, Tmax: 37.7)   Protein:  92-122 g (1.5-2.0 g/kg)   Fluid:  1.8 L  EDUCATION NEEDS:   Education needs no appropriate at this time  [facility], [place], [place] [phone] Pager  [phone] Weekend/On-Call Pager

## 2016-02-08 NOTE — Consult Note (Signed)
Pharmacy Antibiotic Note  [name] is a 60 y.o. male admitted on 02/01/2016 with pneumonia.  Pharmacy has been consulted for eraxis and zosyn dosing (previously on azithro and vanc which was D/c'd due to negative MRSA PCR).  Anidulafungin (eraxis) ordered after patient was intubated and found to have extensive white fluid throughout lungs, suspicious for fungal infection.  Plan: Continue zosyn 3.375 gm EIV q8hrs  Condtinue anidulafungin 100 mg iv q 24 hours.  Height: 5\' 7"  (170.2 cm) Weight: 130 lb 11.7 oz (59.3 kg) IBW/kg (Calculated) : 66.1  Temp (24hrs), Avg:99 F (37.2 C), Min:98.2 F (36.8 C), Max:99.7 F (37.6 C)   Recent Labs Lab 02/01/16 0936  02/01/16 1239  02/03/16 2219 02/04/16 0531 02/04/16 1609 02/05/16 0643 02/06/16 0538 02/07/16 0331 02/08/16 0629  WBC 5.4  --   --   < >  --  12.0*  --  7.0 4.5 4.8 5.7  CREATININE 2.17*  < >  --   < >  --  1.91* 1.52* 1.65* 1.55* 1.45* 1.13  LATICACIDVEN 2.5*  --  1.5  --  1.3  --   --   --   --   --   --   < > = values in this interval not displayed.  Estimated Creatinine Clearance: 59.8 mL/min (by C-G formula based on Cr of 1.13).    No Known Allergies  Antimicrobials this admission: Zosyn 3/24 >>  Anidulafungin (Eraxis) 3/28>> Azithromycin 3/24>>3/28 Vancomycin 3/24 >> 3/26  Microbiology results: 3/25 BCx x2: negative 3/24 UCx: negative 3/24 MRSA PCR: (-) 3/28 BAL: pending  Thank you for allowing pharmacy to be a part of this patient's care.  [name] D 02/08/2016 8:19 AM

## 2016-02-09 LAB — BASIC METABOLIC PANEL
ANION GAP: 1 — AB (ref 5–15)
BUN: 28 mg/dL — AB (ref 6–20)
CALCIUM: 8.9 mg/dL (ref 8.9–10.3)
CO2: 24 mmol/L (ref 22–32)
CREATININE: 0.93 mg/dL (ref 0.61–1.24)
Chloride: 125 mmol/L — ABNORMAL HIGH (ref 101–111)
GFR calc Af Amer: >60 mL/min (ref >60)
GLUCOSE: 182 mg/dL — AB (ref 65–99)
Potassium: 3.8 mmol/L (ref 3.5–5.1)
Sodium: 150 mmol/L — ABNORMAL HIGH (ref 135–145)

## 2016-02-09 LAB — CBC
HCT: 22.3 % — ABNORMAL LOW (ref 40.0–52.0)
HEMOGLOBIN: 7.4 g/dL — AB (ref 13.0–18.0)
MCH: 32.2 pg (ref 26.0–34.0)
MCHC: 33.2 g/dL (ref 32.0–36.0)
MCV: 96.9 fL (ref 80.0–100.0)
PLATELETS: 162 10*3/uL (ref 150–440)
RBC: 2.3 MIL/uL — ABNORMAL LOW (ref 4.40–5.90)
RDW: 15.6 % — AB (ref 11.5–14.5)
WBC: 8 10*3/uL (ref 3.8–10.6)

## 2016-02-09 LAB — CULTURE, BAL-QUANTITATIVE W GRAM STAIN

## 2016-02-09 LAB — GLUCOSE, CAPILLARY
GLUCOSE-CAPILLARY: 213 mg/dL — AB (ref 65–99)
GLUCOSE-CAPILLARY: 153 mg/dL — AB (ref 65–99)
Glucose-Capillary: 139 mg/dL — ABNORMAL HIGH (ref 65–99)
Glucose-Capillary: 188 mg/dL — ABNORMAL HIGH (ref 65–99)
Glucose-Capillary: 108 mg/dL — ABNORMAL HIGH (ref 65–99)

## 2016-02-09 LAB — CULTURE, BAL-QUANTITATIVE: SPECIAL REQUESTS: NORMAL

## 2016-02-09 LAB — MAGNESIUM: MAGNESIUM: 2 mg/dL (ref 1.7–2.4)

## 2016-02-09 LAB — PHOSPHORUS: PHOSPHORUS: 2.7 mg/dL (ref 2.5–4.6)

## 2016-02-09 MED ORDER — CETYLPYRIDINIUM CHLORIDE 0.05 % MT LIQD
7.0000 mL | Freq: Two times a day (BID) | OROMUCOSAL | Status: DC
  Administered 2016-02-09 – 2016-02-10 (×4): 7 mL via OROMUCOSAL

## 2016-02-09 MED ORDER — INSULIN ASPART 100 UNIT/ML ~~LOC~~ SOLN
0.0000 [IU] | SUBCUTANEOUS | Status: DC
  Administered 2016-02-09: 3 [IU] via SUBCUTANEOUS
  Administered 2016-02-09: 2 [IU] via SUBCUTANEOUS
  Administered 2016-02-10 (×5): 1 [IU] via SUBCUTANEOUS
  Administered 2016-02-10: 2 [IU] via SUBCUTANEOUS
  Administered 2016-02-11 – 2016-02-12 (×6): 1 [IU] via SUBCUTANEOUS
  Administered 2016-02-13: 100 [IU] via SUBCUTANEOUS
  Filled 2016-02-09: qty 2
  Filled 2016-02-09 (×5): qty 1
  Filled 2016-02-09: qty 3
  Filled 2016-02-09 (×6): qty 1
  Filled 2016-02-09: qty 2
  Filled 2016-02-09: qty 1

## 2016-02-09 MED ORDER — IPRATROPIUM-ALBUTEROL 0.5-2.5 (3) MG/3ML IN SOLN
RESPIRATORY_TRACT | Status: AC
  Administered 2016-02-09: 3 mL
  Filled 2016-02-09: qty 3

## 2016-02-09 MED ORDER — ENOXAPARIN SODIUM 40 MG/0.4ML ~~LOC~~ SOLN
40.0000 mg | SUBCUTANEOUS | Status: DC
  Administered 2016-02-09 – 2016-02-12 (×4): 40 mg via SUBCUTANEOUS
  Filled 2016-02-09 (×5): qty 0.4

## 2016-02-09 NOTE — Consult Note (Signed)
Pharmacy Antibiotic Note  [name] is a 60 y.o. male admitted on 02/01/2016 with pneumonia.  Pharmacy has been consulted for eraxis and zosyn dosing (previously on azithro and vanc which was D/c'd due to negative MRSA PCR).  Anidulafungin (eraxis) ordered after patient was intubated and found to have extensive white fluid throughout lungs, suspicious for fungal infection.  Plan: Continue zosyn 3.375 gm EIV q8hrs  Condtinue anidulafungin 100 mg iv q 24 hours.  Height: 5\' 7"  (170.2 cm) Weight: 137 lb 9.1 oz (62.4 kg) IBW/kg (Calculated) : 66.1  Temp (24hrs), Avg:98.8 F (37.1 C), Min:96.1 F (35.6 C), Max:99.7 F (37.6 C)   Recent Labs Lab 02/03/16 2219  02/05/16 0643 02/06/16 0538 02/07/16 0331 02/08/16 0629 02/09/16 0551  WBC  --   < > 7.0 4.5 4.8 5.7 8.0  CREATININE  --   < > 1.65* 1.55* 1.45* 1.13 0.93  LATICACIDVEN 1.3  --   --   --   --   --   --   < > = values in this interval not displayed.  Estimated Creatinine Clearance: 76.4 mL/min (by C-G formula based on Cr of 0.93).    No Known Allergies  Antimicrobials this admission: Zosyn 3/24 >>  Anidulafungin (Eraxis) 3/28>> Azithromycin 3/24>>3/28 Vancomycin 3/24 >> 3/26  Microbiology results: 3/25 BCx x2: negative 3/24 UCx: negative 3/24 MRSA PCR: (-) 3/28 BAL: pending  Thank you for allowing pharmacy to be a part of this patient's care.  Charell Faulk D 02/09/2016 9:19 AM

## 2016-02-09 NOTE — Progress Notes (Signed)
LCSW met with ICU nurse and consulted. LCSW agreed to contact group home and collect information to complete assessment and  [place] [phone]

## 2016-02-09 NOTE — Progress Notes (Signed)
[facility]  ROUNDING NOTE   Subjective:   Na 150 (152) (155) (157)   Objective:  Vital signs in last 24 hours:  Temp:  [96.1 F (35.6 C)-99.7 F (37.6 C)] 99.1 F (37.3 C) (04/01 0800) Pulse Rate:  [57-113] 57 (04/01 0800) Resp:  [11-21] 11 (04/01 0800) BP: (85-139)/(53-98) 90/59 mmHg (04/01 0800) SpO2:  [96 %-100 %] 100 % (04/01 0854) FiO2 (%):  [28 %] 28 % (04/01 0854) Weight:  [62.4 kg (137 lb 9.1 oz)] 62.4 kg (137 lb 9.1 oz) (04/01 0500)  Weight change: 3.1 kg (6 lb 13.4 oz) Filed Weights   02/07/16 0500 02/08/16 0510 02/09/16 0500  Weight: 62 kg (136 lb 11 oz) 59.3 kg (130 lb 11.7 oz) 62.4 kg (137 lb 9.1 oz)    Intake/Output: I/O last 3 completed shifts: In: 7832.7 [I.V.:4053; NG/GT:3239.7; IV Piggyback:540] Out: 5101 [Urine:5100; Stool:1]   Intake/Output this shift:  Total I/O In: 560 [I.V.:200; NG/GT:310; IV Piggyback:50] Out: 400 [Urine:400]  Physical Exam: General: Critically ill appearing  Head: Normocephalic, atraumatic. +ETT +OGT  Eyes: Anicteric  Neck: Supple, trachea midline  Lungs:  Diminished bilaterally, PRVC 28% Pressure support  Heart: regular  Abdomen:  Soft, nontender, BS present   Extremities: no peripheral edema.  Neurologic: Sedated, intubated  Skin: No lesions       Basic Metabolic Panel:  Recent Labs Lab 02/03/16 0610  02/04/16 1006 02/04/16 1609 02/05/16 6269 02/06/16 0538  02/07/16 0331  02/08/16 0307 02/08/16 0629 02/08/16 0903 02/08/16 1737 02/09/16 0551  NA 152*  < >  --  150* 155* 155*  < > 157*  < > 152* 152* 151* 148* 150*  K 3.8  < >  --  3.2* 3.5 3.1*  --  3.2*  --   --  3.5  --   --  3.8  CL >130*  < >  --  129* >130* >130*  --  >130*  --   --  >130*  --   --  125*  CO2 19*  < >  --  18* 20* 20*  --  23  --   --  22  --   --  24  GLUCOSE 179*  < >  --  160* 142* 210*  --  196*  --   --  214*  --   --  182*  BUN 70*  < >  --  41* 39* 39*  --  37*  --   --  30*  --   --  28*  CREATININE 2.50*  < >   --  1.52* 1.65* 1.55*  --  1.45*  --   --  1.13  --   --  0.93  CALCIUM 8.0*  < >  --  8.3* 8.7* 8.4*  --  8.4*  --   --  8.5*  --   --  8.9  MG 2.4  --  2.4 2.4  --  2.7*  --   --   --   --  2.2  --   --  2.0  PHOS 5.1*  --   --  2.6  --   --   --   --   --   --   --   --   --  2.7  < > = values in this interval not displayed.  Liver Function Tests: No results for input(s): AST, ALT, ALKPHOS, BILITOT, PROT, ALBUMIN in the last 168 hours. No results for input(s):  LIPASE, AMYLASE in the last 168 hours. No results for input(s): AMMONIA in the last 168 hours.  CBC:  Recent Labs Lab 02/03/16 0610  02/05/16 0643 02/06/16 0538 02/07/16 0331 02/08/16 0629 02/09/16 0551  WBC 9.7  < > 7.0 4.5 4.8 5.7 8.0  NEUTROABS 8.2*  --   --   --   --   --   --   HGB 9.1*  < > 9.6* 8.9* 7.6* 6.9* 7.4*  HCT 27.4*  < > 28.9* 26.6* 22.9* 21.0* 22.3*  MCV 95.0  < > 93.6 95.3 94.6 94.2 96.9  PLT 87*  < > 84* 92* 105* 121* 162  < > = values in this interval not displayed.  Cardiac Enzymes:  Recent Labs Lab 02/03/16 2214 02/04/16 0531 02/04/16 1006 02/04/16 1609  TROPONINI 0.10* 0.18* 0.09* 0.17*    BNP: Invalid input(s): POCBNP  CBG:  Recent Labs Lab 02/08/16 1736 02/08/16 1957 02/08/16 2326 02/09/16 0405 02/09/16 0722  GLUCAP 187* 198* 135* 139* 188*    Microbiology: Results for orders placed or performed during the hospital encounter of 02/01/16  Urine culture     Status: None   Collection Time: 02/01/16  9:36 AM  Result Value Ref Range Status   Specimen Description URINE, RANDOM  Final   Special Requests NONE  Final   Culture NO GROWTH 2 DAYS  Final   Report Status 02/03/2016 FINAL  Final  Culture, blood (routine x 2)     Status: None   Collection Time: 02/01/16  9:36 AM  Result Value Ref Range Status   Specimen Description BLOOD LEFT HAND  Final   Special Requests BOTTLES DRAWN AEROBIC AND ANAEROBIC  1CC  Final   Culture NO GROWTH 5 DAYS  Final   Report Status  02/06/2016 FINAL  Final  Culture, blood (routine x 2)     Status: None   Collection Time: 02/01/16 10:00 AM  Result Value Ref Range Status   Specimen Description BLOOD RIGHT HAND  Final   Special Requests BOTTLES DRAWN AEROBIC AND ANAEROBIC  1CC  Final   Culture NO GROWTH 5 DAYS  Final   Report Status 02/06/2016 FINAL  Final  MRSA PCR Screening     Status: None   Collection Time: 02/01/16  5:00 PM  Result Value Ref Range Status   MRSA by PCR NEGATIVE NEGATIVE Final    Comment:        The GeneXpert MRSA Assay (FDA approved for NASAL specimens only), is one component of a comprehensive MRSA colonization surveillance program. It is not intended to diagnose MRSA infection nor to guide or monitor treatment for MRSA infections.   Urine culture     Status: None   Collection Time: 02/02/16  5:56 PM  Result Value Ref Range Status   Specimen Description URINE, RANDOM  Final   Special Requests NONE  Final   Culture NO GROWTH 2 DAYS  Final   Report Status 02/04/2016 FINAL  Final  Culture, blood (Routine X 2) w Reflex to ID Panel     Status: None   Collection Time: 02/02/16  6:30 PM  Result Value Ref Range Status   Specimen Description BLOOD RIGHT HAND  Final   Special Requests   Final    BOTTLES DRAWN AEROBIC AND ANAEROBIC  AER 4CC ANA 10CC   Culture NO GROWTH 5 DAYS  Final   Report Status 02/07/2016 FINAL  Final  Culture, blood (Routine X 2) w Reflex to ID Panel  Status: None   Collection Time: 02/02/16  6:44 PM  Result Value Ref Range Status   Specimen Description BLOOD BLOOD LEFT HAND  Final   Special Requests BOTTLES DRAWN AEROBIC AND ANAEROBIC 6CC  Final   Culture NO GROWTH 5 DAYS  Final   Report Status 02/07/2016 FINAL  Final  Culture, bal-quantitative     Status: None   Collection Time: 02/05/16 11:35 AM  Result Value Ref Range Status   Specimen Description BRONCHIAL WASHINGS  Final   Special Requests Normal  Final   Gram Stain MODERATE WBC SEEN NO ORGANISMS SEEN    Final   Culture MODERATE GROWTH CANDIDA ALBICANS  Final   Report Status 02/09/2016 FINAL  Final  Culture, fungus without smear ([facility]-Only)     Status: None (Preliminary result)   Collection Time: 02/05/16 11:35 AM  Result Value Ref Range Status   Specimen Description BRONCHIAL WASHINGS  Final   Special Requests Normal  Final   Culture CANDIDA ALBICANS  Final   Report Status PENDING  Incomplete    Coagulation Studies: No results for input(s): LABPROT, INR in the last 72 hours.  Urinalysis: No results for input(s): COLORURINE, LABSPEC, PHURINE, GLUCOSEU, HGBUR, BILIRUBINUR, KETONESUR, PROTEINUR, UROBILINOGEN, NITRITE, LEUKOCYTESUR in the last 72 hours.  Invalid input(s): APPERANCEUR    Imaging: Dg Chest Port 1 View  02/08/2016  CLINICAL DATA:  Hypoxia EXAM: PORTABLE CHEST 1 VIEW COMPARISON:  February 06, 2016 FINDINGS: Endotracheal tube tip is 3.9 cm above the carina. Nasogastric tube tip and side port are below the diaphragm. 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 No pneumothorax. There is persistent left lower lobe consolidation with small left pleural effusion. The right lung is clear. Heart is upper normal in size with pulmonary vascular within normal limits. No adenopathy. IMPRESSION: Tube positions as described without pneumothorax. Persistent left lower lobe consolidation with small left effusion. Right lung clear. No new opacity. No change in cardiac silhouette. Electronically Signed   By: Lowella Grip III M.D.   On: 02/08/2016 07:27     Medications:   . dextrose 100 mL/hr at 02/08/16 1338  . feeding supplement (VITAL HIGH PROTEIN) 1,000 mL (02/09/16 0325)   . anidulafungin  100 mg Intravenous Q24H  . antiseptic oral rinse  7 mL Mouth Rinse QID  . aspirin  324 mg Per Tube Daily  . chlorhexidine gluconate (SAGE KIT)  15 mL Mouth Rinse BID  . free water  200 mL Per Tube 6 times per day  . insulin aspart  0-15 Units Subcutaneous 6 times per day  .  ipratropium-albuterol  3 mL Nebulization Q6H  . levETIRAcetam  500 mg Intravenous Q12H  . pantoprazole sodium  40 mg Per Tube Q1200  . piperacillin-tazobactam (ZOSYN)  IV  3.375 g Intravenous 3 times per day  . risperiDONE  1 mg Sublingual QHS  . sodium chloride flush  3 mL Intravenous Q12H   acetaminophen, fentaNYL (SUBLIMAZE) injection, midazolam  Assessment/ Plan:  60 y.o. black male with no significant past medical history, who was admitted to [facility] on 02/01/2016 for evaluation of altered mental status. He was found have severe metabolic derangements upon admission. He was found to have a right middle lobe pneumonia.   1. Acute renal failure secondary to acute tubular necrosis, in the setting of pneumonia:  No known baseline creatinine.  Creatinine improved. Nonoliguric urine output.  - No acute indication for dialysis. Renally dose all medications.   2. Hypernatremia: Free water deficit of 3  litres. Now with pulmonary edema  - Continue D5W  - Increase free water flushes to 451m q4 hours   3. Metabolic Acidosis: CO2 22. With hypokalemia 3.5  4. Right lower lobe bacterial pneumonia.  - anidulafungin, zosyn  - appreciate pulmonary input. Status post bronchoscopy  5. Anemia with acute renal failure: - has not received epo.  - low threshold for transfusion.     LOS: 8Elmo Jacquelynne Hughes 4/1/20178:57 AM

## 2016-02-09 NOTE — Progress Notes (Signed)
RASS -1. Not F/C. Passed SBT. Extubated and tolerating  Filed Vitals:   02/09/16 0900 02/09/16 1000 02/09/16 1100 02/09/16 1200  BP: 84/61 93/59 89/62  87/54  Pulse: 67 64 66 51  Temp: 99 F (37.2 C) 99 F (37.2 C) 98.8 F (37.1 C) 98.8 F (37.1 C)  TempSrc:      Resp: 11 13 12 16   Height:      Weight:      SpO2: 100% 100% 100% 100%   RASS -1, not F/C HEENT WNL No JVD noted No wheezes Reg, soft systolic M NABS, soft, +BS Ext warm without edema   BMP Latest Ref Rng 02/09/2016 02/08/2016 02/08/2016  Glucose 65 - 99 mg/dL 182(H) - -  BUN 6 - 20 mg/dL 28(H) - -  Creatinine 0.61 - 1.24 mg/dL 0.93 - -  Sodium 135 - 145 mmol/L 150(H) 148(H) 151(H)  Potassium 3.5 - 5.1 mmol/L 3.8 - -  Chloride 101 - 111 mmol/L 125(H) - -  CO2 22 - 32 mmol/L 24 - -  Calcium 8.9 - 10.3 mg/dL 8.9 - -    CBC Latest Ref Rng 02/09/2016 02/08/2016 02/07/2016  WBC 3.8 - 10.6 K/uL 8.0 5.7 4.8  Hemoglobin 13.0 - 18.0 g/dL 7.4(L) 6.9(L) 7.6(L)  Hematocrit 40.0 - 52.0 % 22.3(L) 21.0(L) 22.9(L)  Platelets 150 - 440 K/uL 162 121(L) 105(L)    CXR: NNF  Results for orders placed or performed during the hospital encounter of 02/01/16  Urine culture     Status: None   Collection Time: 02/01/16  9:36 AM  Result Value Ref Range Status   Specimen Description URINE, RANDOM  Final   Special Requests NONE  Final   Culture NO GROWTH 2 DAYS  Final   Report Status 02/03/2016 FINAL  Final  Culture, blood (routine x 2)     Status: None   Collection Time: 02/01/16  9:36 AM  Result Value Ref Range Status   Specimen Description BLOOD LEFT HAND  Final   Special Requests BOTTLES DRAWN AEROBIC AND ANAEROBIC  1CC  Final   Culture NO GROWTH 5 DAYS  Final   Report Status 02/06/2016 FINAL  Final  Culture, blood (routine x 2)     Status: None   Collection Time: 02/01/16 10:00 AM  Result Value Ref Range Status   Specimen Description BLOOD RIGHT HAND  Final   Special Requests BOTTLES DRAWN AEROBIC AND ANAEROBIC  1CC  Final   Culture NO GROWTH 5 DAYS  Final   Report Status 02/06/2016 FINAL  Final  MRSA PCR Screening     Status: None   Collection Time: 02/01/16  5:00 PM  Result Value Ref Range Status   MRSA by PCR NEGATIVE NEGATIVE Final    Comment:        The GeneXpert MRSA Assay (FDA approved for NASAL specimens only), is one component of a comprehensive MRSA colonization surveillance program. It is not intended to diagnose MRSA infection nor to guide or monitor treatment for MRSA infections.   Urine culture     Status: None   Collection Time: 02/02/16  5:56 PM  Result Value Ref Range Status   Specimen Description URINE, RANDOM  Final   Special Requests NONE  Final   Culture NO GROWTH 2 DAYS  Final   Report Status 02/04/2016 FINAL  Final  Culture, blood (Routine X 2) w Reflex to ID Panel     Status: None   Collection Time: 02/02/16  6:30 PM  Result Value Ref  Range Status   Specimen Description BLOOD RIGHT HAND  Final   Special Requests   Final    BOTTLES DRAWN AEROBIC AND ANAEROBIC  AER 4CC ANA 10CC   Culture NO GROWTH 5 DAYS  Final   Report Status 02/07/2016 FINAL  Final  Culture, blood (Routine X 2) w Reflex to ID Panel     Status: None   Collection Time: 02/02/16  6:44 PM  Result Value Ref Range Status   Specimen Description BLOOD BLOOD LEFT HAND  Final   Special Requests BOTTLES DRAWN AEROBIC AND ANAEROBIC 6CC  Final   Culture NO GROWTH 5 DAYS  Final   Report Status 02/07/2016 FINAL  Final  Culture, bal-quantitative     Status: None   Collection Time: 02/05/16 11:35 AM  Result Value Ref Range Status   Specimen Description BRONCHIAL WASHINGS  Final   Special Requests Normal  Final   Gram Stain MODERATE WBC SEEN NO ORGANISMS SEEN   Final   Culture MODERATE GROWTH CANDIDA ALBICANS  Final   Report Status 02/09/2016 FINAL  Final  Culture, fungus without smear ([facility]-Only)     Status: None (Preliminary result)   Collection Time: 02/05/16 11:35 AM  Result Value Ref Range Status   Specimen  Description BRONCHIAL WASHINGS  Final   Special Requests Normal  Final   Culture CANDIDA ALBICANS  Final   Report Status PENDING  Incomplete   Anti-infectives    Start     Dose/Rate Route Frequency Ordered Stop   02/07/16 1300  anidulafungin (ERAXIS) 100 mg in sodium chloride 0.9 % 100 mL IVPB  Status:  Discontinued     100 mg over 90 Minutes Intravenous Every 24 hours 02/07/16 0901 02/09/16 0935   02/06/16 1130  anidulafungin (ERAXIS) 100 mg in sodium chloride 0.9 % 100 mL IVPB  Status:  Discontinued     100 mg over 90 Minutes Intravenous Every 24 hours 02/05/16 1128 02/07/16 0901   02/05/16 1130  anidulafungin (ERAXIS) 200 mg in sodium chloride 0.9 % 200 mL IVPB     200 mg over 180 Minutes Intravenous  Once 02/05/16 1128 02/05/16 1517   02/02/16 1000  azithromycin (ZITHROMAX) 500 mg in dextrose 5 % 250 mL IVPB  Status:  Discontinued     500 mg 250 mL/hr over 60 Minutes Intravenous Every 24 hours 02/01/16 1413 02/05/16 1124   02/02/16 0400  vancomycin (VANCOCIN) IVPB 750 mg/150 ml premix  Status:  Discontinued     750 mg 150 mL/hr over 60 Minutes Intravenous Every 24 hours 02/01/16 1557 02/03/16 1207   02/01/16 1600  vancomycin (VANCOCIN) IVPB 1000 mg/200 mL premix     1,000 mg 200 mL/hr over 60 Minutes Intravenous STAT 02/01/16 1554 02/01/16 1755   02/01/16 1600  piperacillin-tazobactam (ZOSYN) IVPB 3.375 g     3.375 g 12.5 mL/hr over 240 Minutes Intravenous 3 times per day 02/01/16 1555     02/01/16 1315  piperacillin-tazobactam (ZOSYN) IVPB 3.375 g  Status:  Discontinued     3.375 g 100 mL/hr over 30 Minutes Intravenous  Once 02/01/16 1308 02/01/16 1555   02/01/16 1315  vancomycin (VANCOCIN) IVPB 1000 mg/200 mL premix  Status:  Discontinued     1,000 mg 200 mL/hr over 60 Minutes Intravenous  Once 02/01/16 1308 02/01/16 1554   02/01/16 1045  cefTRIAXone (ROCEPHIN) 1 g in dextrose 5 % 50 mL IVPB     1 g 100 mL/hr over 30 Minutes Intravenous  Once 02/01/16 1038 02/01/16 1123  02/01/16 1045  azithromycin (ZITHROMAX) 500 mg in dextrose 5 % 250 mL IVPB     500 mg 250 mL/hr over 60 Minutes Intravenous  Once 02/01/16 1038 02/01/16 1145     IMPRESSION: VDRF - appears resolved Pneumonia Mucus plugging/depressed cough reflex Hypotension, resolved AKI, improving Severe hypernatremia, improving Acute metabolic encephalopathy H/O multiple CVAs  PLAN/REC: Cont supplemental O2 as needed PRN BiPAP Monitor BMET intermittently Monitor I/Os Correct electrolytes as indicated Monitor temp, WBC count Micro and abx as above DVT px: LMWH Monitor CBC intermittently Transfuse per usual guidelines No sedatives!!!  I have spoken with daughter over phone and will meet in person 04/02. She seems to be leaning towards no re-intubation but wishes to have other family members involved in the conversation  CCM time: 40 mins The above time includes time spent in consultation with patient and/or family members and reviewing care plan on multidisciplinary rounds  Merton Border, MD PCCM service Mobile [phone] Pager [phone]

## 2016-02-09 NOTE — Progress Notes (Signed)
Nutrition Follow-up   INTERVENTION:   -Await diet advancement as medically able as pt s/p extubation this am -Pt may benefit from addition of supplement once diet able to be advanced   NUTRITION DIAGNOSIS:   Inadequate oral intake related to acute illness as evidenced by NPO status.  GOAL:   Patient will meet greater than or equal to 90% of their needs  MONITOR:   Diet advancement, Labs, Weight trends, I & O's  REASON FOR ASSESSMENT:   Ventilator, Consult Enteral/tube feeding initiation and management  ASSESSMENT:   60 yo male admitted with acute respiratory failure due to pneumonia, sepsis, AKI due to dehdration with severe hypernatremia  Pt extubated this am. Resting on rounds this afternoon.   Diet Order:  Diet NPO time specified    Current Nutrition: pt tolerating Vital High Protein at 83mL/hr with free water flushes of 244mL q 4 hours this am prior to extubation.    Gastrointestinal Profile: Last BM:  4/1 loose BM   Scheduled Medications:  . antiseptic oral rinse  7 mL Mouth Rinse BID  . enoxaparin (LOVENOX) injection  40 mg Subcutaneous Q24H  . insulin aspart  0-9 Units Subcutaneous 6 times per day  . ipratropium-albuterol  3 mL Nebulization Q6H  . levETIRAcetam  500 mg Intravenous Q12H  . piperacillin-tazobactam (ZOSYN)  IV  3.375 g Intravenous 3 times per day  . sodium chloride flush  3 mL Intravenous Q12H    Continuous Medications:  . dextrose 100 mL/hr at 02/08/16 1338   D5 at 153mL/hr providing 408kcals in 24 hours   Electrolyte/Renal Profile and Glucose Profile:   Recent Labs Lab 02/03/16 0610  02/04/16 1609  02/06/16 0538  02/07/16 0331  02/08/16 0629 02/08/16 0903 02/08/16 1737 02/09/16 0551  NA 152*  < > 150*  < > 155*  < > 157*  < > 152* 151* 148* 150*  K 3.8  < > 3.2*  < > 3.1*  --  3.2*  --  3.5  --   --  3.8  CL >130*  < > 129*  < > >130*  --  >130*  --  >130*  --   --  125*  CO2 19*  < > 18*  < > 20*  --  23  --  22  --   --   24  BUN 70*  < > 41*  < > 39*  --  37*  --  30*  --   --  28*  CREATININE 2.50*  < > 1.52*  < > 1.55*  --  1.45*  --  1.13  --   --  0.93  CALCIUM 8.0*  < > 8.3*  < > 8.4*  --  8.4*  --  8.5*  --   --  8.9  MG 2.4  < > 2.4  --  2.7*  --   --   --  2.2  --   --  2.0  PHOS 5.1*  --  2.6  --   --   --   --   --   --   --   --  2.7  GLUCOSE 179*  < > 160*  < > 210*  --  196*  --  214*  --   --  182*  < > = values in this interval not displayed.   Weight Trend since Admission: Filed Weights   02/07/16 0500 02/08/16 0510 02/09/16 0500  Weight: 136 lb 11 oz (62  kg) 130 lb 11.7 oz (59.3 kg) 137 lb 9.1 oz (62.4 kg)     Skin:  Reviewed, no issues   BMI:  Body mass index is 21.54 kg/(m^2).  Estimated Nutritional Needs:   Kcal:  2006-2341kcals, (IF 1.2-1.4)(AF 1.2)  Protein:  75-93g protein (1.2-1.5g/kg)  Fluid:  1.8-2.1L of fluid   EDUCATION NEEDS:   Education needs no appropriate at this time  Dwyane Luo, RD, LDN Pager [phone] Weekend/On-Call Pager [phone]

## 2016-02-09 NOTE — Consult Note (Signed)
MEDICATION RELATED CONSULT NOTE - FOLLOW UP  Pharmacy Consult for electrolyte management Indication: hypokalemia, hypomagnesemia  No Known Allergies  Patient Measurements: Height: 5\' 7"  (170.2 cm) Weight: 137 lb 9.1 oz (62.4 kg) IBW/kg (Calculated) : 66.1  Vital Signs: Temp: 99.1 F (37.3 C) (04/01 0800) Temp Source: Core (Comment) (04/01 0700) BP: 90/59 mmHg (04/01 0800) Pulse Rate: 57 (04/01 0800) Intake/Output from previous day: 03/31 0701 - 04/01 0700 In: 4567.7 [I.V.:2403; NG/GT:1624.7; IV Piggyback:540] Out: 3250 [Urine:3250] Intake/Output from this shift: Total I/O In: 560 [I.V.:200; NG/GT:310; IV Piggyback:50] Out: 400 [Urine:400]  Assessment: [name] is a 60yo male admitted for PNA. Pharmacy consulted to manage electrolytes in this patient.  All electrolytes wnl   Plan:  Will recheck with AM labs.   [name] D 02/09/2016,9:24 AM

## 2016-02-09 NOTE — Progress Notes (Signed)
Pt. Extubated and tolerated to stay on room air,sat 97%. No apparent resp. Distress noted .

## 2016-02-10 ENCOUNTER — Inpatient Hospital Stay: Payer: Medicare Other

## 2016-02-10 LAB — GLUCOSE, CAPILLARY
GLUCOSE-CAPILLARY: 123 mg/dL — AB (ref 65–99)
GLUCOSE-CAPILLARY: 129 mg/dL — AB (ref 65–99)
Glucose-Capillary: 137 mg/dL — ABNORMAL HIGH (ref 65–99)
Glucose-Capillary: 146 mg/dL — ABNORMAL HIGH (ref 65–99)
Glucose-Capillary: 147 mg/dL — ABNORMAL HIGH (ref 65–99)
Glucose-Capillary: 147 mg/dL — ABNORMAL HIGH (ref 65–99)
Glucose-Capillary: 153 mg/dL — ABNORMAL HIGH (ref 65–99)
Glucose-Capillary: 163 mg/dL — ABNORMAL HIGH (ref 65–99)

## 2016-02-10 LAB — CBC WITH DIFFERENTIAL/PLATELET
BASOS ABS: 0 10*3/uL (ref 0–0.1)
BASOS PCT: 0 %
EOS PCT: 2 %
Eosinophils Absolute: 0.1 10*3/uL (ref 0–0.7)
HEMATOCRIT: 20.8 % — AB (ref 40.0–52.0)
Hemoglobin: 6.9 g/dL — ABNORMAL LOW (ref 13.0–18.0)
Lymphocytes Relative: 16 %
Lymphs Abs: 1 10*3/uL (ref 1.0–3.6)
MCH: 31.6 pg (ref 26.0–34.0)
MCHC: 33.2 g/dL (ref 32.0–36.0)
MCV: 95.3 fL (ref 80.0–100.0)
MONO ABS: 0.2 10*3/uL (ref 0.2–1.0)
Monocytes Relative: 4 %
NEUTROS ABS: 5.1 10*3/uL (ref 1.4–6.5)
Neutrophils Relative %: 78 %
Platelets: 159 10*3/uL (ref 150–440)
RBC: 2.18 MIL/uL — AB (ref 4.40–5.90)
RDW: 15.2 % — AB (ref 11.5–14.5)
WBC: 6.5 10*3/uL (ref 3.8–10.6)

## 2016-02-10 LAB — BASIC METABOLIC PANEL
ANION GAP: 3 — AB (ref 5–15)
BUN: 23 mg/dL — ABNORMAL HIGH (ref 6–20)
CHLORIDE: 119 mmol/L — AB (ref 101–111)
CO2: 23 mmol/L (ref 22–32)
CREATININE: 1.03 mg/dL (ref 0.61–1.24)
Calcium: 8.5 mg/dL — ABNORMAL LOW (ref 8.9–10.3)
GFR calc non Af Amer: >60 mL/min (ref >60)
GLUCOSE: 168 mg/dL — AB (ref 65–99)
Potassium: 3 mmol/L — ABNORMAL LOW (ref 3.5–5.1)
Sodium: 145 mmol/L (ref 135–145)

## 2016-02-10 MED ORDER — IPRATROPIUM-ALBUTEROL 0.5-2.5 (3) MG/3ML IN SOLN: 3.0000 mL | Freq: Four times a day (QID) | RESPIRATORY_TRACT | Status: DC | PRN

## 2016-02-10 MED ORDER — POTASSIUM CHLORIDE 2 MEQ/ML IV SOLN
INTRAVENOUS | Status: DC
  Administered 2016-02-10 – 2016-02-12 (×5): via INTRAVENOUS
  Filled 2016-02-10 (×7): qty 1000

## 2016-02-10 MED ORDER — CHLORHEXIDINE GLUCONATE 0.12 % MT SOLN
15.0000 mL | Freq: Two times a day (BID) | OROMUCOSAL | Status: DC
  Administered 2016-02-11 – 2016-02-13 (×5): 15 mL via OROMUCOSAL
  Filled 2016-02-10 (×5): qty 15

## 2016-02-10 MED ORDER — CETYLPYRIDINIUM CHLORIDE 0.05 % MT LIQD: 7.0000 mL | Freq: Two times a day (BID) | OROMUCOSAL | Status: DC

## 2016-02-10 MED ORDER — SERTRALINE HCL 50 MG PO TABS
50.0000 mg | ORAL_TABLET | Freq: Every day | ORAL | Status: DC
  Administered 2016-02-10 – 2016-02-13 (×4): 50 mg via ORAL
  Filled 2016-02-10 (×4): qty 1

## 2016-02-10 NOTE — Clinical Social Work Note (Signed)
Clinical Social Work Assessment  Patient Details  Name: [name] MRN: [id number] Date of Birth: 1956/01/16  Date of referral:  02/10/16               Reason for consult:  Facility Placement                Permission sought to share information with:  Family Supports, Customer service manager Permission granted to share information::  Yes, Verbal Permission Granted  Name::     [facility] [phone]  Agency::  [facility] [phone]  Relationship::  yes  Contact Information:  yes  Housing/Transportation Living arrangements for the past 2 months:  [place] of Information:  Facility Patient Interpreter Needed:  None Criminal Activity/Legal Involvement Pertinent to Current Situation/Hospitalization:    Significant Relationships:  Spouse Lives with:  Facility Resident Do you feel safe going back to the place where you live?  Yes Need for family participation in patient care:  Yes (Comment)  Care giving concerns: Patient was unable to participate in his assessment data collected from [place] Provider   Social Worker assessment / plan: LCSW met with patient on April 1st and he was able to nod yes and no to simple questions. LCSW was informed the patient became very weak in the group home and was encouraged to drink ensures. Patient very weak and refused to get out of bed and [facility] brought him to ED. Once family contacted more information will be collected. He is married, he has a daughter and he has medicare and Medicaid and has been in [place] since February 2017. They do not have a lot of medical history on the patient. LCSW in process of collecting more information from family  Employment status:  Disabled (Comment on whether or not currently receiving Disability) Insurance information:  Medicare, Medicaid In [place] PT Recommendations:  Not assessed at this time Information / Referral to community resources:  Other (Comment Required) (Group Home in  [place])  Patient/Family's Response to care:  TBD  Patient/Family's Understanding of and Emotional Response to Diagnosis, Current Treatment, and Prognosis:  TBD  Emotional Assessment Appearance:  Appears stated age Attitude/Demeanor/Rapport:  Unresponsive Affect (typically observed):  Unable to Assess Orientation:  Fluctuating Orientation (Suspected and/or reported Sundowners), Oriented to Self Alcohol / Substance use:  Never Used Psych involvement (Current and /or in the community):  No (Comment)  Discharge Needs  Concerns to be addressed:  Adjustment to Illness Readmission within the last 30 days:  No Current discharge risk:  Chronically ill Barriers to Discharge:  Continued Medical Work up   [place], [place], LCSW 02/10/2016, 8:05 AM

## 2016-02-10 NOTE — Progress Notes (Signed)
Subjective: Pt now stable post-extubation for acute respiratory failure from mucous plugging and pneumonia. Less SOB. Denies CP. Afebrele. AKI has resolved.  Objective: Vital signs in last 24 hours: Temp:  [98.8 F (37.1 C)-99.9 F (37.7 C)] 99.1 F (37.3 C) (04/02 0900) Pulse Rate:  [50-74] 53 (04/02 0900) Resp:  [11-19] 13 (04/02 0900) BP: (85-103)/(44-70) 98/62 mmHg (04/02 0900) SpO2:  [95 %-100 %] 98 % (04/02 1024) Weight change:  Last BM Date: 02/09/16  Intake/Output from previous day: 04/01 0701 - 04/02 0700 In: 3028.4 [I.V.:2126; NG/GT:492.4; IV Piggyback:410] Out: 2365 [Urine:2365] Intake/Output this shift:    General appearance: alert, cachectic and no distress Neck: no adenopathy, no carotid bruit, no JVD, supple, symmetrical, trachea midline and thyroid not enlarged, symmetric, no tenderness/mass/nodules Resp: diminished breath sounds bilaterally and rhonchi bilaterally Cardio: slow rate, regualr rhythm. no rubs or gallops GI: soft, non-tender; bowel sounds normal; no masses,  no organomegaly Extremities: extremities normal, atraumatic, no cyanosis or edema Neurologic: Alert and oriented X 3, normal strength and tone. Normal symmetric reflexes. Normal coordination and gait  Lab Results:  Recent Labs  02/09/16 0551 02/10/16 0458  WBC 8.0 6.5  HGB 7.4* 6.9*  HCT 22.3* 20.8*  PLT 162 159   BMET  Recent Labs  02/09/16 0551 02/10/16 0458  NA 150* 145  K 3.8 3.0*  CL 125* 119*  CO2 24 23  GLUCOSE 182* 168*  BUN 28* 23*  CREATININE 0.93 1.03  CALCIUM 8.9 8.5*    Studies/Results: Dg Chest 1 View  02/10/2016  CLINICAL DATA:  Pulmonary edema. EXAM: CHEST 1 VIEW COMPARISON:  February 08, 2016. FINDINGS: Stable cardiomediastinal silhouette. Endotracheal and nasogastric tubes have been removed. Left-sided pacemaker is unchanged in position. No pneumothorax is noted. Right lung is clear. Mild left basilar opacity is noted concerning for pneumonia or atelectasis  with associated pleural effusion. Bony thorax is unremarkable. IMPRESSION: Mild left basilar opacity concerning for pneumonia or atelectasis with associated effusion. Electronically Signed   By: Marijo Conception, M.D.   On: 02/10/2016 09:25   Ct Head Wo Contrast  02/10/2016  CLINICAL DATA:  60 year old male with altered mental status and right-sided weakness. EXAM: CT HEAD WITHOUT CONTRAST TECHNIQUE: Contiguous axial images were obtained from the base of the skull through the vertex without intravenous contrast. COMPARISON:  02/01/2016 FINDINGS: Atrophy and moderate to severe chronic small-vessel white matter ischemic changes are again noted. 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 No acute intracranial abnormalities are identified, including mass lesion or mass effect, hydrocephalus, extra-axial fluid collection, midline shift, hemorrhage, or acute infarction. The visualized bony calvarium is unremarkable. IMPRESSION: No evidence of acute intracranial abnormality. Atrophy, chronic small-vessel white matter ischemic changes and remote infarcts as described. Electronically Signed   By: Margarette Canada M.D.   On: 02/10/2016 09:28    Medications: I have reviewed the patient's current medications.  Assessment/Plan: Will move to telemetry and assume care. No change in meds for now. Repeat labs and CXR tomorrow   LOS: 9 days   SPARKS,JEFFREY D 02/10/2016, 10:48 AM

## 2016-02-10 NOTE — Progress Notes (Signed)
Pt is alert, oriented to self an situation, follows commands.  Pupils are unequal, 3 mm left and 1 mm on right.  CT head obtained and negative for any acute changes.  Sinus Brady, lungs diminished on room air.  Pt remains NPO, speech consult pending.  VSS, afebrile.  Remains on D5W with 40K @100ml /hr.  Pt to be transferred to Telemetry when bed available.  Adequate urine output from foley, per Dr. Doy Hutching leave foley in place at this time.

## 2016-02-10 NOTE — Progress Notes (Signed)
eLink Physician-Brief Progress Note Patient Name: [name] DOB: 12/29/1955 MRN: [id number]   Date of Service  02/10/2016  HPI/Events of Note  Camera check on patient post extubation on 4/1. Patient currently normotensive &saturating well on room air. Sleeping comfortably turned to the right side.  eICU Interventions  Continue ICU monitoring overnight.     Intervention Category Major Interventions: Respiratory failure - evaluation and management  Tera Partridge 02/10/2016, 2:10 AM

## 2016-02-10 NOTE — Progress Notes (Signed)
PT Cancellation Note  Patient Details Name: [name] MRN: [id number] DOB: 06/15/1956   Cancelled Treatment:    Reason Eval/Treat Not Completed: Patient not medically ready Pt's Hgb at 6.9, has low BP - will check in tomorrow to assess if pt is ready for PT.   Wayne Both, PT, DPT [phone]  Kreg Shropshire 02/10/2016, 2:14 PM

## 2016-02-10 NOTE — Progress Notes (Signed)
Pt pupils are unequal, right is 1 mm and sluggish, left is 3 mm and brisk.  Pt has some weakness to RLE.  Spoke with Dr. Juleen China regarding pupils and weakness, per [facility] order for CT Head and CXR.

## 2016-02-10 NOTE — Plan of Care (Signed)
Problem: Fluid and Electrolyte Imbalance Goal: Ability to maintain adequate oxygenation and ventilation will improve Outcome: Progressing Pt. On rm air since extubation, no complications  Problem: Safety: Goal: Ability to remain free from injury will improve Outcome: Progressing Pt. On bedrest, no injuries O/N  Problem: Health Behavior/Discharge Planning: Goal: Ability to manage health-related needs will improve Outcome: Not Progressing Pt. Is dependent and requires repositioning and all direct care to be done by staff  Problem: Pain Managment: Goal: General experience of comfort will improve Outcome: Progressing Pt. Was able to rest without any medication assistance  Problem: Physical Regulation: Goal: Ability to maintain clinical measurements within normal limits will improve Outcome: Progressing VSS O/N  Problem: Skin Integrity: Goal: Risk for impaired skin integrity will decrease Outcome: Not Progressing Pt. Unable to reposition himself and requires prevalon boots on feet bilaterally to protect deep tissue injuries on heels  Problem: Fluid Volume: Goal: Ability to maintain a balanced intake and output will improve Outcome: Progressing Pt. Getting 100 mL/h of maintenance fluid- net I & O for shift: +290.  Problem: Nutrition: Goal: Adequate nutrition will be maintained Outcome: Not Progressing Pt. Awaiting nutrition consult, NPO since extubation yesterday  Problem: Bowel/Gastric: Goal: Will not experience complications related to bowel motility Outcome: Progressing Pt. Had BM on 02/09/16, active bowel sounds

## 2016-02-10 NOTE — NC FL2 (Deleted)
[place] LEVEL OF CARE SCREENING TOOL     IDENTIFICATION  Patient Name: [name] Birthdate: 1956-02-12 Sex: male Admission Date (Current Location): 02/01/2016  [place] and [place] Number:  Engineering geologist and Address:  [facility], [address] [place], [place] [address]      Provider Number: [id number]  Attending Physician Name and Address:  Wilhelmina Mcardle, MD  Relative Name and Phone Number:       Current Level of Care: Hospital Recommended Level of Care: [facility] Prior Approval Number:    Date Approved/Denied:   PASRR Number:    Discharge Plan:  [facility])    Current Diagnoses: Patient Active Problem List   Diagnosis Date Noted  . Collapse of lung   . Mucus plugging of bronchi   . Acute on chronic respiratory failure ([place])   . Pressure ulcer 02/04/2016  . Right lower lobe pneumonia 02/01/2016  . Septic shock ([place]) 02/01/2016  . Hypernatremia 02/01/2016  . Acute renal failure (ARF) ([place]) 02/01/2016  . Lactic acidosis 02/01/2016  . Metabolic encephalopathy [id number]  . Thrombocytopenia ([place]) 02/01/2016  . Anemia 02/01/2016  . Sepsis ([place]) 02/01/2016    Orientation RESPIRATION BLADDER Height & Weight     Self    Incontinent Weight: 137 lb 9.1 oz (62.4 kg) Height:  5\' 7"  (170.2 cm)  BEHAVIORAL SYMPTOMS/MOOD NEUROLOGICAL BOWEL NUTRITION STATUS      Incontinent Diet (Stopped eating)  AMBULATORY STATUS COMMUNICATION OF NEEDS Skin   Supervision   Normal                       Personal Care Assistance Level of Assistance  Bathing, Feeding, Dressing, Total care Bathing Assistance: Limited assistance Feeding assistance: Limited assistance Dressing Assistance: Limited assistance Total Care Assistance: Limited assistance   Functional Limitations Info  Sight, Hearing, Speech Sight Info: Adequate Hearing Info: Adequate Speech Info: Adequate    SPECIAL CARE FACTORS FREQUENCY                        Contractures Contractures Info: Not present    Additional Factors Info  Code Status Code Status Info: DNR             Current Medications (02/10/2016):  This is the current hospital active medication list Current Facility-Administered Medications  Medication Dose Route Frequency Provider Last Rate Last Dose  . acetaminophen (TYLENOL) suppository 650 mg  650 mg Rectal Q6H PRN Mikael Spray, NP   650 mg at 02/02/16 1752  . antiseptic oral rinse (CPC / CETYLPYRIDINIUM CHLORIDE 0.05%) solution 7 mL  7 mL Mouth Rinse BID Wilhelmina Mcardle, MD   7 mL at 02/09/16 2300  . dextrose 5% 1000 mL with potassium chloride 40 mEq/L IV infusion   Intravenous Continuous Mikael Spray, NP 100 mL/hr at 02/10/16 0733    . enoxaparin (LOVENOX) injection 40 mg  40 mg Subcutaneous Q24H Wilhelmina Mcardle, MD   40 mg at 02/09/16 2208  . insulin aspart (novoLOG) injection 0-9 Units  0-9 Units Subcutaneous 6 times per day Wilhelmina Mcardle, MD   1 Units at 02/10/16 0410  . ipratropium-albuterol (DUONEB) 0.5-2.5 (3) MG/3ML nebulizer solution 3 mL  3 mL Nebulization Q6H Flora Lipps, MD   3 mL at 02/10/16 0254  . levETIRAcetam (KEPPRA) 500 mg in sodium chloride 0.9 % 100 mL IVPB  500 mg Intravenous Q12H Wilhelmina Mcardle,  MD   500 mg at 02/10/16 0034  . piperacillin-tazobactam (ZOSYN) IVPB 3.375 g  3.375 g Intravenous 3 times per day Theodoro Grist, MD   3.375 g at 02/10/16 0556  . sodium chloride flush (NS) 0.9 % injection 3 mL  3 mL Intravenous Q12H Theodoro Grist, MD   3 mL at 02/09/16 2200     Discharge Medications: Please see discharge summary for a list of discharge medications.  Relevant Imaging Results:  Relevant Lab Results:   Additional Information    Pauletta [place], [place], [place]

## 2016-02-10 NOTE — Progress Notes (Signed)
Dr. Doy Hutching at bedside seeing pt.  Per Dr. Doy Hutching, leave foley in place at present time due to pt immobility.

## 2016-02-10 NOTE — Progress Notes (Signed)
LCSW called and collected data with group home consultant [facility]. LCSW met with patient late yesterday afternoon and he was able to answer some questions. LCSW will proceed to complete assessment/Fl2, Patient is able to return to group home once he is better.  [employer]  [phone]

## 2016-02-10 NOTE — Progress Notes (Signed)
[facility]  ROUNDING NOTE   Subjective:   nas 145  Extubated  Now with unequal pupils and right sided weakness  Objective:  Vital signs in last 24 hours:  Temp:  [98.8 F (37.1 C)-99.9 F (37.7 C)] 99.1 F (37.3 C) (04/02 0700) Pulse Rate:  [50-74] 61 (04/02 0700) Resp:  [11-19] 14 (04/02 0700) BP: (84-102)/(44-70) 102/61 mmHg (04/02 0700) SpO2:  [95 %-100 %] 99 % (04/02 0700) FiO2 (%):  [28 %] 28 % (04/01 0854)  Weight change:  Filed Weights   02/07/16 0500 02/08/16 0510 02/09/16 0500  Weight: 62 kg (136 lb 11 oz) 59.3 kg (130 lb 11.7 oz) 62.4 kg (137 lb 9.1 oz)    Intake/Output: I/O last 3 completed shifts: In: 7463.1 [I.V.:4526; NG/GT:2117.1; IV Piggyback:820] Out: [id number] [Urine:4115]   Intake/Output this shift:     Physical Exam: General: Critically ill appearing  Head: Normocephalic, atraumatic.  Eyes: Anicteric, unequal pupils  Neck: Supple, trachea midline  Lungs:  Diminished bilaterally  Heart: regular  Abdomen:  Soft, nontender, BS present   Extremities: no peripheral edema.  Neurologic: Lethargic, answering commands  Skin: No lesions       Basic Metabolic Panel:  Recent Labs Lab 02/04/16 1006 02/04/16 1609  02/06/16 0538  02/07/16 0331  02/08/16 0629 02/08/16 0903 02/08/16 1737 02/09/16 0551 02/10/16 0458  NA  --  150*  < > 155*  < > 157*  < > 152* 151* 148* 150* 145  K  --  3.2*  < > 3.1*  --  3.2*  --  3.5  --   --  3.8 3.0*  CL  --  129*  < > >130*  --  >130*  --  >130*  --   --  125* 119*  CO2  --  18*  < > 20*  --  23  --  22  --   --  24 23  GLUCOSE  --  160*  < > 210*  --  196*  --  214*  --   --  182* 168*  BUN  --  41*  < > 39*  --  37*  --  30*  --   --  28* 23*  CREATININE  --  1.52*  < > 1.55*  --  1.45*  --  1.13  --   --  0.93 1.03  CALCIUM  --  8.3*  < > 8.4*  --  8.4*  --  8.5*  --   --  8.9 8.5*  MG 2.4 2.4  --  2.7*  --   --   --  2.2  --   --  2.0  --   PHOS  --  2.6  --   --   --   --   --   --   --    --  2.7  --   < > = values in this interval not displayed.  Liver Function Tests: No results for input(s): AST, ALT, ALKPHOS, BILITOT, PROT, ALBUMIN in the last 168 hours. No results for input(s): LIPASE, AMYLASE in the last 168 hours. No results for input(s): AMMONIA in the last 168 hours.  CBC:  Recent Labs Lab 02/05/16 0643 02/06/16 0538 02/07/16 0331 02/08/16 0629 02/09/16 0551  WBC 7.0 4.5 4.8 5.7 8.0  HGB 9.6* 8.9* 7.6* 6.9* 7.4*  HCT 28.9* 26.6* 22.9* 21.0* 22.3*  MCV 93.6 95.3 94.6 94.2 96.9  PLT 84* 92* 105* 121* 162  Cardiac Enzymes:  Recent Labs Lab 02/03/16 2214 02/04/16 0531 02/04/16 1006 02/04/16 1609  TROPONINI 0.10* 0.18* 0.09* 0.17*    BNP: Invalid input(s): POCBNP  CBG:  Recent Labs Lab 02/09/16 1625 02/09/16 2059 02/10/16 0011 02/10/16 0400 02/10/16 0721  GLUCAP 153* 108* 147* 146* 163*    Microbiology: Results for orders placed or performed during the hospital encounter of 02/01/16  Urine culture     Status: None   Collection Time: 02/01/16  9:36 AM  Result Value Ref Range Status   Specimen Description URINE, RANDOM  Final   Special Requests NONE  Final   Culture NO GROWTH 2 DAYS  Final   Report Status 02/03/2016 FINAL  Final  Culture, blood (routine x 2)     Status: None   Collection Time: 02/01/16  9:36 AM  Result Value Ref Range Status   Specimen Description BLOOD LEFT HAND  Final   Special Requests BOTTLES DRAWN AEROBIC AND ANAEROBIC  1CC  Final   Culture NO GROWTH 5 DAYS  Final   Report Status 02/06/2016 FINAL  Final  Culture, blood (routine x 2)     Status: None   Collection Time: 02/01/16 10:00 AM  Result Value Ref Range Status   Specimen Description BLOOD RIGHT HAND  Final   Special Requests BOTTLES DRAWN AEROBIC AND ANAEROBIC  1CC  Final   Culture NO GROWTH 5 DAYS  Final   Report Status 02/06/2016 FINAL  Final  MRSA PCR Screening     Status: None   Collection Time: 02/01/16  5:00 PM  Result Value Ref Range  Status   MRSA by PCR NEGATIVE NEGATIVE Final    Comment:        The GeneXpert MRSA Assay (FDA approved for NASAL specimens only), is one component of a comprehensive MRSA colonization surveillance program. It is not intended to diagnose MRSA infection nor to guide or monitor treatment for MRSA infections.   Urine culture     Status: None   Collection Time: 02/02/16  5:56 PM  Result Value Ref Range Status   Specimen Description URINE, RANDOM  Final   Special Requests NONE  Final   Culture NO GROWTH 2 DAYS  Final   Report Status 02/04/2016 FINAL  Final  Culture, blood (Routine X 2) w Reflex to ID Panel     Status: None   Collection Time: 02/02/16  6:30 PM  Result Value Ref Range Status   Specimen Description BLOOD RIGHT HAND  Final   Special Requests   Final    BOTTLES DRAWN AEROBIC AND ANAEROBIC  AER 4CC ANA 10CC   Culture NO GROWTH 5 DAYS  Final   Report Status 02/07/2016 FINAL  Final  Culture, blood (Routine X 2) w Reflex to ID Panel     Status: None   Collection Time: 02/02/16  6:44 PM  Result Value Ref Range Status   Specimen Description BLOOD BLOOD LEFT HAND  Final   Special Requests BOTTLES DRAWN AEROBIC AND ANAEROBIC 6CC  Final   Culture NO GROWTH 5 DAYS  Final   Report Status 02/07/2016 FINAL  Final  Culture, bal-quantitative     Status: None   Collection Time: 02/05/16 11:35 AM  Result Value Ref Range Status   Specimen Description BRONCHIAL WASHINGS  Final   Special Requests Normal  Final   Gram Stain MODERATE WBC SEEN NO ORGANISMS SEEN   Final   Culture MODERATE GROWTH CANDIDA ALBICANS  Final   Report Status 02/09/2016 FINAL  Final  Culture, fungus without smear ([facility]-Only)     Status: None (Preliminary result)   Collection Time: 02/05/16 11:35 AM  Result Value Ref Range Status   Specimen Description BRONCHIAL WASHINGS  Final   Special Requests Normal  Final   Culture CANDIDA ALBICANS  Final   Report Status PENDING  Incomplete    Coagulation Studies: No  results for input(s): LABPROT, INR in the last 72 hours.  Urinalysis: No results for input(s): COLORURINE, LABSPEC, PHURINE, GLUCOSEU, HGBUR, BILIRUBINUR, KETONESUR, PROTEINUR, UROBILINOGEN, NITRITE, LEUKOCYTESUR in the last 72 hours.  Invalid input(s): APPERANCEUR    Imaging: No results found.   Medications:   . dextrose 5 % with KCl Pediatric custom IV fluid 100 mL/hr at 02/10/16 0733   . antiseptic oral rinse  7 mL Mouth Rinse BID  . enoxaparin (LOVENOX) injection  40 mg Subcutaneous Q24H  . insulin aspart  0-9 Units Subcutaneous 6 times per day  . ipratropium-albuterol  3 mL Nebulization Q6H  . levETIRAcetam  500 mg Intravenous Q12H  . piperacillin-tazobactam (ZOSYN)  IV  3.375 g Intravenous 3 times per day  . sodium chloride flush  3 mL Intravenous Q12H   acetaminophen  Assessment/ Plan:  60 y.o. black male with no significant past medical history, who was admitted to [facility] on 02/01/2016 for evaluation of altered mental status. He was found have severe metabolic derangements upon admission. He was found to have a right middle lobe pneumonia.   1. Acute renal failure secondary to acute tubular necrosis, in the setting of pneumonia:  No known baseline creatinine.  Creatinine improved. Nonoliguric urine output.   2. Hypernatremia: Free water deficit improved. Now with pulmonary edema  - Continue D5W  - Swallow eval and CT before allowing PO  3. Metabolic Acidosis: CO2 22. With hypokalemia 3 - IV potassium replacement  4. Right lower lobe bacterial pneumonia. Afebrile  - anidulafungin, zosyn  - appreciate pulmonary input. Status post bronchoscopy  5. Anemia with acute renal failure: no CBC this morning - has not received epo.  - low threshold for transfusion.     LOS: [place], [name] 4/2/20177:45 AM

## 2016-02-10 NOTE — Progress Notes (Addendum)
Tolerating extubation. RASS 0. + F/C CT head ordered by Kolluru - No evidence of acute intracranial abnormality  Filed Vitals:   02/10/16 0600 02/10/16 0700 02/10/16 0800 02/10/16 0900  BP: 93/57 102/61 103/63 98/62  Pulse: 59 61 52 53  Temp: 99.7 F (37.6 C) 99.1 F (37.3 C) 99.1 F (37.3 C) 99.1 F (37.3 C)  TempSrc:      Resp: 17 14 14 13   Height:      Weight:      SpO2: 97% 99% 99% 100%   RASS 0, + F/C HEENT WNL No JVD noted No wheezes Reg, soft systolic M NABS, soft, +BS Ext warm without edema   BMP Latest Ref Rng 02/10/2016 02/09/2016 02/08/2016  Glucose 65 - 99 mg/dL 168(H) 182(H) -  BUN 6 - 20 mg/dL 23(H) 28(H) -  Creatinine 0.61 - 1.24 mg/dL 1.03 0.93 -  Sodium 135 - 145 mmol/L 145 150(H) 148(H)  Potassium 3.5 - 5.1 mmol/L 3.0(L) 3.8 -  Chloride 101 - 111 mmol/L 119(H) 125(H) -  CO2 22 - 32 mmol/L 23 24 -  Calcium 8.9 - 10.3 mg/dL 8.5(L) 8.9 -    CBC Latest Ref Rng 02/10/2016 02/09/2016 02/08/2016  WBC 3.8 - 10.6 K/uL 6.5 8.0 5.7  Hemoglobin 13.0 - 18.0 g/dL 6.9(L) 7.4(L) 6.9(L)  Hematocrit 40.0 - 52.0 % 20.8(L) 22.3(L) 21.0(L)  Platelets 150 - 440 K/uL 159 162 121(L)    CXR (4/02): [place]  Results for orders placed or performed during the hospital encounter of 02/01/16  Urine culture     Status: None   Collection Time: 02/01/16  9:36 AM  Result Value Ref Range Status   Specimen Description URINE, RANDOM  Final   Special Requests NONE  Final   Culture NO GROWTH 2 DAYS  Final   Report Status 02/03/2016 FINAL  Final  Culture, blood (routine x 2)     Status: None   Collection Time: 02/01/16  9:36 AM  Result Value Ref Range Status   Specimen Description BLOOD LEFT HAND  Final   Special Requests BOTTLES DRAWN AEROBIC AND ANAEROBIC  1CC  Final   Culture NO GROWTH 5 DAYS  Final   Report Status 02/06/2016 FINAL  Final  Culture, blood (routine x 2)     Status: None   Collection Time: 02/01/16 10:00 AM  Result Value Ref Range Status   Specimen Description BLOOD  RIGHT HAND  Final   Special Requests BOTTLES DRAWN AEROBIC AND ANAEROBIC  1CC  Final   Culture NO GROWTH 5 DAYS  Final   Report Status 02/06/2016 FINAL  Final  MRSA PCR Screening     Status: None   Collection Time: 02/01/16  5:00 PM  Result Value Ref Range Status   MRSA by PCR NEGATIVE NEGATIVE Final    Comment:        The GeneXpert MRSA Assay (FDA approved for NASAL specimens only), is one component of a comprehensive MRSA colonization surveillance program. It is not intended to diagnose MRSA infection nor to guide or monitor treatment for MRSA infections.   Urine culture     Status: None   Collection Time: 02/02/16  5:56 PM  Result Value Ref Range Status   Specimen Description URINE, RANDOM  Final   Special Requests NONE  Final   Culture NO GROWTH 2 DAYS  Final   Report Status 02/04/2016 FINAL  Final  Culture, blood (Routine X 2) w Reflex to ID Panel     Status: None  Collection Time: 02/02/16  6:30 PM  Result Value Ref Range Status   Specimen Description BLOOD RIGHT HAND  Final   Special Requests   Final    BOTTLES DRAWN AEROBIC AND ANAEROBIC  AER 4CC ANA 10CC   Culture NO GROWTH 5 DAYS  Final   Report Status 02/07/2016 FINAL  Final  Culture, blood (Routine X 2) w Reflex to ID Panel     Status: None   Collection Time: 02/02/16  6:44 PM  Result Value Ref Range Status   Specimen Description BLOOD BLOOD LEFT HAND  Final   Special Requests BOTTLES DRAWN AEROBIC AND ANAEROBIC 6CC  Final   Culture NO GROWTH 5 DAYS  Final   Report Status 02/07/2016 FINAL  Final  Culture, bal-quantitative     Status: None   Collection Time: 02/05/16 11:35 AM  Result Value Ref Range Status   Specimen Description BRONCHIAL WASHINGS  Final   Special Requests Normal  Final   Gram Stain MODERATE WBC SEEN NO ORGANISMS SEEN   Final   Culture MODERATE GROWTH CANDIDA ALBICANS  Final   Report Status 02/09/2016 FINAL  Final  Culture, fungus without smear ([facility]-Only)     Status: None (Preliminary  result)   Collection Time: 02/05/16 11:35 AM  Result Value Ref Range Status   Specimen Description BRONCHIAL WASHINGS  Final   Special Requests Normal  Final   Culture CANDIDA ALBICANS  Final   Report Status PENDING  Incomplete   Anti-infectives    Start     Dose/Rate Route Frequency Ordered Stop   02/07/16 1300  anidulafungin (ERAXIS) 100 mg in sodium chloride 0.9 % 100 mL IVPB  Status:  Discontinued     100 mg over 90 Minutes Intravenous Every 24 hours 02/07/16 0901 02/09/16 0935   02/06/16 1130  anidulafungin (ERAXIS) 100 mg in sodium chloride 0.9 % 100 mL IVPB  Status:  Discontinued     100 mg over 90 Minutes Intravenous Every 24 hours 02/05/16 1128 02/07/16 0901   02/05/16 1130  anidulafungin (ERAXIS) 200 mg in sodium chloride 0.9 % 200 mL IVPB     200 mg over 180 Minutes Intravenous  Once 02/05/16 1128 02/05/16 1517   02/02/16 1000  azithromycin (ZITHROMAX) 500 mg in dextrose 5 % 250 mL IVPB  Status:  Discontinued     500 mg 250 mL/hr over 60 Minutes Intravenous Every 24 hours 02/01/16 1413 02/05/16 1124   02/02/16 0400  vancomycin (VANCOCIN) IVPB 750 mg/150 ml premix  Status:  Discontinued     750 mg 150 mL/hr over 60 Minutes Intravenous Every 24 hours 02/01/16 1557 02/03/16 1207   02/01/16 1600  vancomycin (VANCOCIN) IVPB 1000 mg/200 mL premix     1,000 mg 200 mL/hr over 60 Minutes Intravenous STAT 02/01/16 1554 02/01/16 1755   02/01/16 1600  piperacillin-tazobactam (ZOSYN) IVPB 3.375 g  Status:  Discontinued     3.375 g 12.5 mL/hr over 240 Minutes Intravenous 3 times per day 02/01/16 1555 02/10/16 0907   02/01/16 1315  piperacillin-tazobactam (ZOSYN) IVPB 3.375 g  Status:  Discontinued     3.375 g 100 mL/hr over 30 Minutes Intravenous  Once 02/01/16 1308 02/01/16 1555   02/01/16 1315  vancomycin (VANCOCIN) IVPB 1000 mg/200 mL premix  Status:  Discontinued     1,000 mg 200 mL/hr over 60 Minutes Intravenous  Once 02/01/16 1308 02/01/16 1554   02/01/16 1045  cefTRIAXone  (ROCEPHIN) 1 g in dextrose 5 % 50 mL IVPB  1 g 100 mL/hr over 30 Minutes Intravenous  Once 02/01/16 1038 02/01/16 1123   02/01/16 1045  azithromycin (ZITHROMAX) 500 mg in dextrose 5 % 250 mL IVPB     500 mg 250 mL/hr over 60 Minutes Intravenous  Once 02/01/16 1038 02/01/16 1145     IMPRESSION: VDRF, resolved Pneumonia, NOS - Has completed 9 days abx Mucus plugging - resolved Hypotension, resolved Sinus bradycardia -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 Prior CVAs noted  PLAN/REC: Transfer to telemetry Cont supplemental O2 as needed Monitor BMET intermittently Monitor I/Os Correct electrolytes as indicated IVFs adjusted by Dr Juleen China Monitor temp, WBC count Micro and abx as above DVT px: LMWH Monitor CBC intermittently NPO until SLP eval completed  Merton Border, MD PCCM service Mobile [phone] Pager [phone]

## 2016-02-11 ENCOUNTER — Inpatient Hospital Stay: Payer: Medicare Other

## 2016-02-11 LAB — GLUCOSE, CAPILLARY
GLUCOSE-CAPILLARY: 134 mg/dL — AB (ref 65–99)
GLUCOSE-CAPILLARY: 123 mg/dL — AB (ref 65–99)
GLUCOSE-CAPILLARY: 123 mg/dL — AB (ref 65–99)
Glucose-Capillary: 103 mg/dL — ABNORMAL HIGH (ref 65–99)
Glucose-Capillary: 124 mg/dL — ABNORMAL HIGH (ref 65–99)
Glucose-Capillary: 127 mg/dL — ABNORMAL HIGH (ref 65–99)

## 2016-02-11 LAB — BASIC METABOLIC PANEL
Anion gap: 2 — ABNORMAL LOW (ref 5–15)
BUN: 15 mg/dL (ref 6–20)
CHLORIDE: 115 mmol/L — AB (ref 101–111)
CO2: 24 mmol/L (ref 22–32)
CREATININE: 0.85 mg/dL (ref 0.61–1.24)
Calcium: 8.5 mg/dL — ABNORMAL LOW (ref 8.9–10.3)
GFR calc Af Amer: >60 mL/min (ref >60)
GFR calc non Af Amer: >60 mL/min (ref >60)
GLUCOSE: 138 mg/dL — AB (ref 65–99)
Potassium: 3 mmol/L — ABNORMAL LOW (ref 3.5–5.1)
Sodium: 141 mmol/L (ref 135–145)

## 2016-02-11 LAB — CBC
HCT: 21.4 % — ABNORMAL LOW (ref 40.0–52.0)
Hemoglobin: 7.1 g/dL — ABNORMAL LOW (ref 13.0–18.0)
MCH: 31 pg (ref 26.0–34.0)
MCHC: 33.3 g/dL (ref 32.0–36.0)
MCV: 93.1 fL (ref 80.0–100.0)
PLATELETS: 195 10*3/uL (ref 150–440)
RBC: 2.3 MIL/uL — AB (ref 4.40–5.90)
RDW: 14.8 % — AB (ref 11.5–14.5)
WBC: 6.3 10*3/uL (ref 3.8–10.6)

## 2016-02-11 LAB — POTASSIUM: POTASSIUM: 4.9 mmol/L (ref 3.5–5.1)

## 2016-02-11 MED ORDER — POTASSIUM CHLORIDE 20 MEQ PO PACK
40.0000 meq | PACK | Freq: Once | ORAL | Status: AC
  Administered 2016-02-11: 40 meq via ORAL
  Filled 2016-02-11: qty 2

## 2016-02-11 NOTE — Progress Notes (Signed)
Nutrition Follow-up  DOCUMENTATION CODES:      INTERVENTION:   Medical Food Supplement Therapy: will send Mighty Shakes TID and Magic Cup BID on meal trays  NUTRITION DIAGNOSIS:   Inadequate oral intake related to acute illness as evidenced by NPO status.  GOAL:   Patient will meet greater than or equal to 90% of their needs  MONITOR:   PO intake, Supplement acceptance, Labs, Skin, Weight trends  REASON FOR ASSESSMENT:   Ventilator, Consult Enteral/tube feeding initiation and management  ASSESSMENT:   60 yo male admitted with acute respiratory failure due to pneumonia, sepsis, AKI due to dehdration with severe hypernatremia  Pt s/p extubation, diet advanced post SLP evaluation today  Diet Order:  DIET - DYS 1 Room service appropriate?: Yes with Assist; Fluid consistency:: Nectar Thick  Energy Intake: no po intake recorded, diet just advanced today  Skin:   (multiple deep tissue injuries)  Last BM:  02/10/16    Recent Labs Lab 02/04/16 1609  02/06/16 0538  02/08/16 0629  02/09/16 0551 02/10/16 0458 02/11/16 0351 02/11/16 1311  NA 150*  < > 155*  < > 152*  < > 150* 145 141  --   K 3.2*  < > 3.1*  < > 3.5  --  3.8 3.0* 3.0* 4.9  CL 129*  < > >130*  < > >130*  --  125* 119* 115*  --   CO2 18*  < > 20*  < > 22  --  24 23 24   --   BUN 41*  < > 39*  < > 30*  --  28* 23* 15  --   CREATININE 1.52*  < > 1.55*  < > 1.13  --  0.93 1.03 0.85  --   CALCIUM 8.3*  < > 8.4*  < > 8.5*  --  8.9 8.5* 8.5*  --   MG 2.4  --  2.7*  --  2.2  --  2.0  --   --   --   PHOS 2.6  --   --   --   --   --  2.7  --   --   --   GLUCOSE 160*  < > 210*  < > 214*  --  182* 168* 138*  --   < > = values in this interval not displayed.  Meds: D5 at 100 ml/hr, potassium chloride, ss novolog  Height:   Ht Readings from Last 1 Encounters:  02/03/16 5\' 7"  (1.702 m)    Weight:   Wt Readings from Last 1 Encounters:  02/09/16 137 lb 9.1 oz (62.4 kg)    Filed Weights   02/07/16 0500  02/08/16 0510 02/09/16 0500  Weight: 136 lb 11 oz (62 kg) 130 lb 11.7 oz (59.3 kg) 137 lb 9.1 oz (62.4 kg)    BMI:  Body mass index is 21.54 kg/(m^2).  Estimated Nutritional Needs:   Kcal:  2006-2341kcals, (IF 1.2-1.4)(AF 1.2)  Protein:  75-93g protein (1.2-1.5g/kg)  Fluid:  1.8-2.1L of fluid   EDUCATION NEEDS:   Education needs no appropriate at this time  [place], [place], [place] [phone] Pager  [phone] Weekend/On-Call Pager

## 2016-02-11 NOTE — Progress Notes (Addendum)
[place] [facility] [place] NAME: [name]    MR#:  [id number]  DATE OF BIRTH:  December 31, 1955  SUBJECTIVE:   Patient has been extubated for acute respiratory failure from mucous plugging and pneumonia. Nursing patient was talking this morning whenever currently patient opens his eyes and is alert by does not answer any questions. Per intensivist and nursing this may be at baseline  REVIEW OF SYSTEMS:    Review of Systems  Unable to perform ROS: medical condition    Tolerating Diet: Nothing by mouth      DRUG ALLERGIES:  No Known Allergies  VITALS:  Blood pressure 92/63, pulse 77, temperature 99.3 F (37.4 C), temperature source Core (Comment), resp. rate 21, height 5\' 7"  (1.702 m), weight 62.4 kg (137 lb 9.1 oz), SpO2 97 %.  PHYSICAL EXAMINATION:   Physical Exam  Constitutional: He is well-developed, well-nourished, and in no distress. No distress.  HENT:  Head: Normocephalic.  Eyes: No scleral icterus.  Neck: No JVD present. No tracheal deviation present.  Cardiovascular: Normal rate, regular rhythm and normal heart sounds.  Exam reveals no gallop and no friction rub.   No murmur heard. Pulmonary/Chest: Effort normal and breath sounds normal. No respiratory distress. He has no wheezes. He has no rales. He exhibits no tenderness.  Abdominal: Soft. Bowel sounds are normal. He exhibits no distension and no mass. There is no tenderness. There is no rebound and no guarding.  Musculoskeletal: Normal range of motion. He exhibits no edema.  Neurological: He is alert.  Skin: Skin is warm. No rash noted. No erythema.      LABORATORY PANEL:   CBC  Recent Labs Lab 02/11/16 0351  WBC 6.3  HGB 7.1*  HCT 21.4*  PLT 195   ------------------------------------------------------------------------------------------------------------------  Chemistries   Recent Labs Lab 02/09/16 0551  02/11/16 0351  NA 150*  < > 141  K  3.8  < > 3.0*  CL 125*  < > 115*  CO2 24  < > 24  GLUCOSE 182*  < > 138*  BUN 28*  < > 15  CREATININE 0.93  < > 0.85  CALCIUM 8.9  < > 8.5*  MG 2.0  --   --   < > = values in this interval not displayed. ------------------------------------------------------------------------------------------------------------------  Cardiac Enzymes  Recent Labs Lab 02/04/16 1609  TROPONINI 0.17*   ------------------------------------------------------------------------------------------------------------------  RADIOLOGY:  Dg Chest 1 View  02/10/2016  CLINICAL DATA:  Pulmonary edema. EXAM: CHEST 1 VIEW COMPARISON:  February 08, 2016. FINDINGS: Stable cardiomediastinal silhouette. Endotracheal and nasogastric tubes have been removed. Left-sided pacemaker is unchanged in position. No pneumothorax is noted. Right lung is clear. Mild left basilar opacity is noted concerning for pneumonia or atelectasis with associated pleural effusion. Bony thorax is unremarkable. IMPRESSION: Mild left basilar opacity concerning for pneumonia or atelectasis with associated effusion. Electronically Signed   By: Marijo Conception, M.D.   On: 02/10/2016 09:25   Dg Chest 2 View  02/11/2016  CLINICAL DATA:  Left lower lobe pneumonia. Septic shock. Acute renal failure. EXAM: CHEST  2 VIEW COMPARISON:  02/10/2016 FINDINGS: Left lower lobe airspace disease shows no significant interval change. Right lung remains clear. Mild cardiomegaly stable. Pacemaker remains in appropriate position. IMPRESSION: Left lower lobe airspace disease, without significant change. Electronically Signed   By: Earle Gell M.D.   On: 02/11/2016 08:13   Ct Head Wo Contrast  02/10/2016  CLINICAL DATA:  60 year old male with  altered mental status and right-sided weakness. EXAM: CT HEAD WITHOUT CONTRAST TECHNIQUE: Contiguous axial images were obtained from the base of the skull through the vertex without intravenous contrast. COMPARISON:  02/01/2016 FINDINGS: Atrophy  and moderate to severe chronic small-vessel white matter ischemic changes are again noted. 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 No acute intracranial abnormalities are identified, including mass lesion or mass effect, hydrocephalus, extra-axial fluid collection, midline shift, hemorrhage, or acute infarction. The visualized bony calvarium is unremarkable. IMPRESSION: No evidence of acute intracranial abnormality. Atrophy, chronic small-vessel white matter ischemic changes and remote infarcts as described. Electronically Signed   By: Margarette Canada M.D.   On: 02/10/2016 09:28     ASSESSMENT AND PLAN:   60 year old male who is initially brought in with altered mental status and found to have pneumonia and mucous plug and was subsequently intubated and now extubated.   1. Acute respiratory failure: This is resolved. Patient has now been extubated. He has completed 90 treatment for community-acquired pneumonia. His mucous plug has also resolved.  2. Septic shock: Hypotension has resolved. Patient was treated for sepsis due to pneumonia as mentioned above.    3. Acute renal failure secondary to ATN in the setting of pneumonia: Creatinine is much improved.  4. Severe hypernatremia: Sodium level has now improved as well on D5. Will needs to stop this and see how he does without dextrose.  5. Anemia of chronic disease: Hemoglobin has remained stable. No indication for blood transfusion.  6. Hypokalemia: Potassium will be repleted and magnesium level will be checked..  Patient needs speech and physical therapy consultation for disposition.     CODE STATUS: DNR TOTAL TIME TAKING CARE OF THIS PATIENT: 25 minutes.     POSSIBLE D/C 1-2 days, DEPENDING ON CLINICAL CONDITION.   [name] M.D on 02/11/2016 at 11:35 AM  Between 7am to 6pm - Pager - [phone] After 6pm go to [email or web address] - password [email or web address] [facility]   Office  [phone]  CC: Primary care physician; Vista Mink, FNP  Note: This dictation was prepared with Dragon dictation along with smaller phrase technology. Any transcriptional errors that result from this process are unintentional.

## 2016-02-11 NOTE — Care Management (Signed)
Extubated and floor care as of 4/2.  Pt consult pending.

## 2016-02-11 NOTE — Evaluation (Signed)
Clinical/Bedside Swallow Evaluation Patient Details  Name: [name] MRN: [id number] Date of Birth: 02/09/1956  Today's Date: 02/11/2016 Time: SLP Start Time (ACUTE ONLY): [id number] SLP Stop Time (ACUTE ONLY): 1515 SLP Time Calculation (min) (ACUTE ONLY): 60 min  Past Medical History:  Past Medical History  Diagnosis Date  . Discontinued smoking    Past Surgical History: History reviewed. No pertinent past surgical history. HPI:  Pt is a 60 y.o. male with unknown past medical history who was brought to the hospital because of poor responsiveness. Apparently patient is a resident of group home, usually is independent in his activities, however, since yesterday, was noted to be laying around and is eating somnolent and today he became poorly responsive, he is he was brought to emergency room for further evaluation and treatment, chest x-ray revealed right lower lobe pneumonia, he was hypotensive, systolic blood pressure in 70s on arrival to the hospital, also hypothermic, he was given 2 L of IV fluids, however, blood pressure still remains low. Labs revealed a hypernatremia, acute renal failure, mild anemia, thrombocytopenia, lactic acidosis. Pt was dx'd w/ pneumonia and septic shock. Recent CXR f\u - "L lung collapse, new compared to CXR on 3/24, Most likely mucus plugged is the reason for the this new finding.", per MD note. Pt has weaned from BiPAP to NRB to [place]  O2 support currently w/ stable O2 sats and RR/HR per NSG. Pt is awake w/ mumbled speech giving few responses to basic questions; he does not consistently follow instruction and requires mod. cues for follow through. Pt has been NPO since oral extubation (5 days orally intubated). NSG reported pt tolerated ice chips last night.    Assessment / Plan / Recommendation Clinical Impression  This BSE was completed post pt's recent oral intubation now extubation indicating a change in status from his previous BSE this admission. Pt appeared to  adequately tolerate trials of ice chips, Nectar consistency liquids via smaller, 1-2 sips from straw (piinched to monitor amt), and trials of puree (1/2 tsp size) w/ no immediate, overt s/s of aspiration noted. No immediate coughing or change in vocal quality noted during mumbled speech; no decline in O2 sats or RR w/ po trials. However, overt s/s of aspiration (Delayed Coughing) were noted w/ trials of thin liquids x2/2 trials. Pt exhibited declined Cognitive awareness for po trials and required max verbal/tactile cues for follow through w/ po tasks/trials. His declined Cognitive awareness can then increased risk for pharyngeal phase deficits. Pt was given rest breaks to avoid any increased WOB w/ exertion of po trials - no overt decline in O2 sats or RR/HR occured during po trials given. Pt required full feeding assistance and cues including monitoring of bolus size during drinking liquids. Of note, post po trials, pt appeared to tolerate few ice chip trials. Pt appeares at moderate risk for aspiration sec. to declined Cognitive and Medical/Respiratory status' overall. Rec. initiate a dysphagia 1 diet w/ Nectar consistency liqudis w/ strict aspiration precautions during feeding. ST will f/u to assess tolerance of diet consistency for any potential upgrade next 1-3 days. NSG updated.     Aspiration Risk  Moderate aspiration risk    Diet Recommendation  Dys. 1 w/ Nectar liquids; strict aspiration precautions; feeding assistance at meals; rest breaks as nec.  Medication Administration: Crushed with puree    Other  Recommendations Recommended Consults:  (Dietician) Oral Care Recommendations: Oral care BID;Staff/trained caregiver to provide oral care Other Recommendations: Order thickener from pharmacy;Prohibited food (jello, ice cream,  thin soups);Remove water pitcher   Follow up Recommendations  Skilled Nursing facility (TBD)    Frequency and Duration min 3x week  2 weeks       Prognosis  Prognosis for Safe Diet Advancement: Fair Barriers to Reach Goals: Cognitive deficits      Swallow Study   General Date of Onset: 02/01/16 HPI: Pt is a 60 y.o. male with unknown past medical history who was brought to the hospital because of poor responsiveness. Apparently patient is a resident of group home, usually is independent in his activities, however, since yesterday, was noted to be laying around and is eating somnolent and today he became poorly responsive, he is he was brought to emergency room for further evaluation and treatment, chest x-ray revealed right lower lobe pneumonia, he was hypotensive, systolic blood pressure in 70s on arrival to the hospital, also hypothermic, he was given 2 L of IV fluids, however, blood pressure still remains low. Labs revealed a hypernatremia, acute renal failure, mild anemia, thrombocytopenia, lactic acidosis. Pt was dx'd w/ pneumonia and septic shock. Recent CXR f\u - "L lung collapse, new compared to CXR on 3/24, Most likely mucus plugged is the reason for the this new finding.", per MD note. Pt has weaned from BiPAP to NRB to [place] O2 support currently w/ stable O2 sats and RR/HR per NSG. Pt is awake w/ mumbled speech giving few responses to basic questions; he does not consistently follow instruction and requires mod. cues for follow through. Pt has been NPO since oral extubation (5 days orally intubated). NSG reported pt tolerated ice chips last night.  Type of Study: Bedside Swallow Evaluation (re-evaluation post oral extubation) Previous Swallow Assessment: 3.27.17 Diet Prior to this Study: NPO (orally intubated since 02/05/16) Temperature Spikes Noted: Yes (temps 99; wbc 6.3) Respiratory Status: Room air History of Recent Intubation: Yes Length of Intubations (days): 5 days Date extubated: 02/09/16 Behavior/Cognition: Cooperative;Pleasant mood;Distractible;Requires cueing (awake) Oral Cavity Assessment: Dry (sticky) Oral Care Completed by SLP:  Yes Oral Cavity - Dentition: Adequate natural dentition;Missing dentition Vision:  (n/a) Self-Feeding Abilities: Total assist (helped to hold cup) Patient Positioning: Upright in bed Baseline Vocal Quality: Low vocal intensity (mumbled speech) Volitional Cough: Cognitively unable to elicit Volitional Swallow: Unable to elicit    Oral/Motor/Sensory Function Overall Oral Motor/Sensory Function: Generalized oral weakness (min.; unable to assess overall sec. to cognitive status) Facial Symmetry: Within Functional Limits Lingual ROM: Within Functional Limits Lingual Symmetry: Within Functional Limits   Ice Chips Ice chips: Within functional limits Presentation: Spoon (fed 3 trials) Pharyngeal Phase Impairments:  (none)   Thin Liquid Thin Liquid: Impaired Presentation: Cup;Straw (2 trials; assisted w/ feeding support of cup) Oral Phase Impairments:  (none) Oral Phase Functional Implications:  (none) Pharyngeal  Phase Impairments: Cough - Delayed (x2/2 trials) Other Comments: congested, phlegmy cough    Nectar Thick Nectar Thick Liquid: Within functional limits Presentation: Straw (fed; ~3 ozs total) Oral Phase Impairments:  (none) Oral phase functional implications:  (none) Pharyngeal Phase Impairments:  (none)   Honey Thick Honey Thick Liquid: Not tested   Puree Puree: Within functional limits Presentation: Spoon (fed; 8-9 trials) Oral Phase Impairments:  (none) Oral Phase Functional Implications:  (none) Pharyngeal Phase Impairments:  (none)   Solid   GO   Solid: Not tested         Orinda Kenner, MS, CCC-SLP  Watson,Katherine 02/11/2016,4:40 PM

## 2016-02-11 NOTE — Progress Notes (Signed)
Patient transferred to room. Bed alarm on. Patient resting quietly, no complaints. Skin checked with Velna Hatchet RN. Tele box called into [facility].

## 2016-02-11 NOTE — Plan of Care (Signed)
Problem: Fluid and Electrolyte Imbalance Goal: Ability to maintain adequate oxygenation and ventilation will improve Outcome: Progressing Pt. Maintains appropriate O2Sats on room air O/N  Problem: Pain Managment: Goal: General experience of comfort will improve Outcome: Progressing Pt. Denied pain and was able to rest most of the evening.  Problem: Physical Regulation: Goal: Ability to maintain clinical measurements within normal limits will improve Outcome: Progressing VSS O/N  Problem: Fluid Volume: Goal: Ability to maintain a balanced intake and output will improve Outcome: Progressing Pt.'s net I&O +118  Problem: Nutrition: Goal: Adequate nutrition will be maintained Outcome: Not Progressing Pt. Scheduled for SLP eval  Problem: Bowel/Gastric: Goal: Will not experience complications related to bowel motility Outcome: Progressing Pt. LBM 02/10/16

## 2016-02-11 NOTE — Care Management (Signed)
Informed by physical therapy discipline was not able to evaluate patient today because he became combative

## 2016-02-11 NOTE — Progress Notes (Signed)
PT Cancellation Note  Patient Details Name: [name] MRN: [id number] DOB: 1956/02/17   Cancelled Treatment:    Reason Eval/Treat Not Completed: Other (comment) Started evaluation of pt. Pt became agitated and combative during eval. Pt unwilling to participate or have PT move LE. Started growling at therapist. Unable to further evaluate pt at this time. Will re-attempt next date.    Sherral Hammers 02/11/2016, 2:27 PM M. Barnett Abu, SPT

## 2016-02-11 NOTE — Clinical Social Work Note (Signed)
Patient's hemoglobin was too low in order to work with PT today. It is difficult to surmise if patient will be able to return to his family care home until he is assessed by PT. CSW attempting to contact patient's family care home to determine what the name of the family care home is as this was not mentioned in original assessment. CSW has noted patient has orders for floor care. CSW will continue to follow.  Shela Leff MSW,LCSW [phone]

## 2016-02-12 LAB — GLUCOSE, CAPILLARY
Glucose-Capillary: 113 mg/dL — ABNORMAL HIGH (ref 65–99)
Glucose-Capillary: 121 mg/dL — ABNORMAL HIGH (ref 65–99)
Glucose-Capillary: 112 mg/dL — ABNORMAL HIGH (ref 65–99)
Glucose-Capillary: 112 mg/dL — ABNORMAL HIGH (ref 65–99)
Glucose-Capillary: 91 mg/dL (ref 65–99)

## 2016-02-12 LAB — VITAMIN B12: Vitamin B-12: 1109 pg/mL — ABNORMAL HIGH (ref 180–914)

## 2016-02-12 LAB — TSH: TSH: 5.352 u[IU]/mL — AB (ref 0.350–4.500)

## 2016-02-12 MED ORDER — ASPIRIN 325 MG PO TABS: 325.0000 mg | ORAL_TABLET | Freq: Every day | ORAL | Status: DC

## 2016-02-12 MED ORDER — LEVETIRACETAM 100 MG/ML PO SOLN
500.0000 mg | Freq: Two times a day (BID) | ORAL | Status: DC
  Administered 2016-02-12: 500 mg via ORAL
  Filled 2016-02-12 (×2): qty 5

## 2016-02-12 MED ORDER — LEVETIRACETAM 100 MG/ML PO SOLN
500.0000 mg | Freq: Two times a day (BID) | ORAL | Status: DC
  Administered 2016-02-13: 500 mg via ORAL
  Filled 2016-02-12 (×11): qty 5

## 2016-02-12 MED ORDER — LORAZEPAM 0.5 MG PO TABS: 0.5000 mg | ORAL_TABLET | Freq: Two times a day (BID) | ORAL | Status: DC | PRN

## 2016-02-12 NOTE — Progress Notes (Signed)
PT Cancellation Note  Patient Details Name: [name] MRN: [id number] DOB: 09/04/1956   Cancelled Treatment:    Reason Eval/Treat Not Completed: Other (comment) Pt lethargic and unwilling to participate in PT. Pt noted soiled in bed, RN notified. Unable to further evaluate pt at this time. Will re-attempt next date.    Sherral Hammers 02/12/2016, 2:35 PM M. Barnett Abu, SPT

## 2016-02-12 NOTE — Progress Notes (Signed)
Speech Therapy Note: reviewed chart notes; consulted NSG re: pt's status at this time. Pt is on a Dysphagia diet w/ Nectar consistency liquids. Pt presents w/ increased lethargy at this time per NSG and has not taken po's today d/t such. Reviewed aspiration precautions posted in room and rec'd strict supervision by NSG when attempting to feed pt any po's, or to hold meal trays until pt is more alert/awake. NSG agreed. ST services is available for any further education on swallowing and aspiration precautions as needed. Will f/u w/ pt's status next 1-2 days as indicated.

## 2016-02-12 NOTE — Progress Notes (Signed)
[facility] at [place] NAME: [name]    MR#:  [id number]  DATE OF BIRTH:  06-Jan-1956  SUBJECTIVE:   Patient got agitated when physical therapy try to work with him. No acute events overnight. Patient does not really contribute to todays visit.  As per nursing he was eating however had to be fed.  REVIEW OF SYSTEMS:    Review of Systems  Unable to perform ROS: medical condition    Tolerating Diet: yes     DRUG ALLERGIES:  No Known Allergies  VITALS:  Blood pressure 94/67, pulse 52, temperature 97.5 F (36.4 C), temperature source Oral, resp. rate 16, height 5\' 7"  (1.702 m), weight 62.4 kg (137 lb 9.1 oz), SpO2 93 %.  PHYSICAL EXAMINATION:   Physical Exam  Constitutional: He is well-developed, well-nourished, and in no distress. No distress.  HENT:  Head: Normocephalic.  Eyes: No scleral icterus.  Neck: No JVD present. No tracheal deviation present.  Cardiovascular: Normal rate, regular rhythm and normal heart sounds.  Exam reveals no gallop and no friction rub.   No murmur heard. Pulmonary/Chest: Effort normal and breath sounds normal. No respiratory distress. He has no wheezes. He has no rales. He exhibits no tenderness.  Abdominal: Soft. Bowel sounds are normal. He exhibits no distension and no mass. There is no tenderness. There is no rebound and no guarding.  Musculoskeletal: Normal range of motion. He exhibits no edema.  Neurological: He is alert.  Skin: Skin is warm. No rash noted. No erythema.      LABORATORY PANEL:   CBC  Recent Labs Lab 02/11/16 0351  WBC 6.3  HGB 7.1*  HCT 21.4*  PLT 195   ------------------------------------------------------------------------------------------------------------------  Chemistries   Recent Labs Lab 02/09/16 0551  02/11/16 0351 02/11/16 1311  NA 150*  < > 141  --   K 3.8  < > 3.0* 4.9  CL 125*  < > 115*  --   CO2 24  < > 24  --   GLUCOSE 182*  <  > 138*  --   BUN 28*  < > 15  --   CREATININE 0.93  < > 0.85  --   CALCIUM 8.9  < > 8.5*  --   MG 2.0  --   --   --   < > = values in this interval not displayed. ------------------------------------------------------------------------------------------------------------------  Cardiac Enzymes No results for input(s): TROPONINI in the last 168 hours. ------------------------------------------------------------------------------------------------------------------  RADIOLOGY:  Dg Chest 2 View  02/11/2016  CLINICAL DATA:  Left lower lobe pneumonia. Septic shock. Acute renal failure. EXAM: CHEST  2 VIEW COMPARISON:  02/10/2016 FINDINGS: Left lower lobe airspace disease shows no significant interval change. Right lung remains clear. Mild cardiomegaly stable. Pacemaker remains in appropriate position. IMPRESSION: Left lower lobe airspace disease, without significant change. Electronically Signed   By: Earle Gell M.D.   On: 02/11/2016 08:13     ASSESSMENT AND PLAN:   60 year old male who is initially brought in with altered mental status and found to have pneumonia and mucous plug and was subsequently intubated and now extubated.   1. Acute respiratory failure: This was due to PNA and mucus plug. This is resolved. Patient has now been extubated. He has completed 9 day treatment for community-acquired pneumonia.  2. Septic shock: Hypotension has resolved. Patient was treated for sepsis due to pneumonia as mentioned above.    3. Acute renal failure secondary to ATN in the  setting of pneumonia: Creatinine is much improved.  4. Severe hypernatremia: Sodium level has now improved as well on D5. Stop fluids and check Na tomorrow. 5. Anemia of chronic disease: Hemoglobin has remained stable. No indication for blood transfusion.  6. Hypokalemia: resolved   7. Moderate aspiration risk. Continue Dys. 1 w/ Nectar liquids; strict aspiration precautions; feeding assistance at meals; rest breaks as  nec.  Medication Administration: Crushed with puree    Await physical therapy consult for disposition.     CODE STATUS: DNR TOTAL TIME TAKING CARE OF THIS PATIENT: 25 minutes.     POSSIBLE D/C tomorrow?? DEPENDING ON CLINICAL CONDITION.   Contrina Orona M.D on 02/12/2016 at 12:14 PM  Between 7am to 6pm - Pager - [phone] After 6pm go to [email or web address] - password [email or web address] [facility]  Office  [phone]  CC: Primary care physician; Vista Mink, FNP  Note: This dictation was prepared with Dragon dictation along with smaller phrase technology. Any transcriptional errors that result from this process are unintentional.

## 2016-02-12 NOTE — Care Management Important Message (Signed)
Important Message  Patient Details  Name: [name] MRN: [id number] Date of Birth: [birth year]   Medicare Important Message Given:  Yes    [name] A Rajendra Spiller 02/12/2016, 10:08 AM

## 2016-02-12 NOTE — Care Management (Signed)
Spoke with [name] from [facility] to determine patient's baseline functional and cognitive status.  At baseline patient was a total care patient and had to be fed.  Offered very little verbal communication.  [name] says that Larry Mesi- home owner informed her that patient could return to facility at discharge.  He was incontinent.  CM asked that patient be assessed prior to return.  She or owner will assess patient 02/13/2016.  Patient has not participated with physical therapy- today he was lethargic.  Updated csw

## 2016-02-13 LAB — BASIC METABOLIC PANEL
ANION GAP: 7 (ref 5–15)
BUN: 12 mg/dL (ref 6–20)
CO2: 24 mmol/L (ref 22–32)
Calcium: 9 mg/dL (ref 8.9–10.3)
Chloride: 107 mmol/L (ref 101–111)
Creatinine, Ser: 0.91 mg/dL (ref 0.61–1.24)
Glucose, Bld: 101 mg/dL — ABNORMAL HIGH (ref 65–99)
POTASSIUM: 4.1 mmol/L (ref 3.5–5.1)
SODIUM: 138 mmol/L (ref 135–145)

## 2016-02-13 LAB — VIRUS CULTURE

## 2016-02-13 LAB — GLUCOSE, CAPILLARY
GLUCOSE-CAPILLARY: 111 mg/dL — AB (ref 65–99)
GLUCOSE-CAPILLARY: 114 mg/dL — AB (ref 65–99)
GLUCOSE-CAPILLARY: 134 mg/dL — AB (ref 65–99)
GLUCOSE-CAPILLARY: 82 mg/dL (ref 65–99)
GLUCOSE-CAPILLARY: 95 mg/dL (ref 65–99)
Glucose-Capillary: 110 mg/dL — ABNORMAL HIGH (ref 65–99)
Glucose-Capillary: 142 mg/dL — ABNORMAL HIGH (ref 65–99)

## 2016-02-13 LAB — CBC
HCT: 27.1 % — ABNORMAL LOW (ref 40.0–52.0)
HEMOGLOBIN: 9 g/dL — AB (ref 13.0–18.0)
MCH: 31.5 pg (ref 26.0–34.0)
MCHC: 33.4 g/dL (ref 32.0–36.0)
MCV: 94.5 fL (ref 80.0–100.0)
PLATELETS: 315 10*3/uL (ref 150–440)
RBC: 2.87 MIL/uL — AB (ref 4.40–5.90)
RDW: 14.8 % — ABNORMAL HIGH (ref 11.5–14.5)
WBC: 5.2 10*3/uL (ref 3.8–10.6)

## 2016-02-13 LAB — T4, FREE: FREE T4: 1.28 ng/dL — AB (ref 0.61–1.12)

## 2016-02-13 MED ORDER — SODIUM CHLORIDE 0.9 % IV BOLUS (SEPSIS)
500.0000 mL | Freq: Once | INTRAVENOUS | Status: AC
  Administered 2016-02-13: 500 mL via INTRAVENOUS

## 2016-02-13 MED ORDER — SODIUM CHLORIDE 0.9 % IV BOLUS (SEPSIS)
250.0000 mL | Freq: Once | INTRAVENOUS | Status: AC
  Administered 2016-02-13: 250 mL via INTRAVENOUS

## 2016-02-13 MED ORDER — MIDODRINE HCL 5 MG PO TABS
2.5000 mg | ORAL_TABLET | Freq: Two times a day (BID) | ORAL | Status: DC
  Administered 2016-02-13: 2.5 mg via ORAL
  Filled 2016-02-13 (×3): qty 1

## 2016-02-13 MED ORDER — LORAZEPAM 0.5 MG PO TABS: 0.5000 mg | ORAL_TABLET | Freq: Two times a day (BID) | ORAL | Status: DC | PRN

## 2016-02-13 MED ORDER — SERTRALINE HCL 50 MG PO TABS: 50.0000 mg | ORAL_TABLET | Freq: Every day | ORAL | Status: DC

## 2016-02-13 MED ORDER — LEVETIRACETAM 100 MG/ML PO SOLN: 500.0000 mg | Freq: Two times a day (BID) | ORAL | Status: DC

## 2016-02-13 MED ORDER — MIDODRINE HCL 5 MG PO TABS
2.5000 mg | ORAL_TABLET | Freq: Once | ORAL | Status: AC
  Administered 2016-02-13: 2.5 mg via ORAL
  Filled 2016-02-13: qty 1

## 2016-02-13 MED ORDER — SODIUM CHLORIDE 0.9 % IV BOLUS (SEPSIS)
500.0000 mL | INTRAVENOUS | Status: AC
  Administered 2016-02-13: 500 mL via INTRAVENOUS

## 2016-02-13 MED ORDER — IPRATROPIUM-ALBUTEROL 0.5-2.5 (3) MG/3ML IN SOLN: 3.0000 mL | Freq: Four times a day (QID) | RESPIRATORY_TRACT | Status: DC | PRN

## 2016-02-13 NOTE — Progress Notes (Signed)
Pt's BP remains low, MD Dr. Jannifer Franklin notified. Orders given for additional 233mL bolus. RN will continue to monitor. Rachael Fee, RN

## 2016-02-13 NOTE — Progress Notes (Signed)
Waiting for bolus to finish but patient did eat a good lunch with his aide from the group home. Per Dr. Benjie Karvonen, recheck BP after bolus finishes and as long as SBP > 90 patient can be discharged. Group home employee is still here and is supposed to be meeting with Margreta Journey, [place] soon.

## 2016-02-13 NOTE — Progress Notes (Signed)
Dr. Benjie Karvonen aware of low BP. Order to give 500cc bolus.

## 2016-02-13 NOTE — Discharge Summary (Addendum)
[facility] at [place] NAME: [name]    MR#:  [id number]  DATE OF BIRTH:  [birth year]  DATE OF ADMISSION:  02/01/2016 ADMITTING PHYSICIAN: Theodoro Grist, MD  DATE OF DISCHARGE: 4/5/017  PRIMARY CARE PHYSICIAN: Vista Mink, FNP    ADMISSION DIAGNOSIS:  Hypernatremia [E87.0] ARF (acute renal failure) (HCC) [N17.9] Right lower lobe pneumonia [J18.9] Sepsis, due to unspecified organism ([place]) [A41.9]  DISCHARGE DIAGNOSIS:  Principal Problem:   Septic shock ([place]) Active Problems:   Right lower lobe pneumonia   Hypernatremia   Acute renal failure (ARF) (HCC)   Lactic acidosis   Metabolic encephalopathy   Thrombocytopenia (HCC)   Anemia   Sepsis (HCC)   Pressure ulcer   Collapse of lung   Mucus plugging of bronchi   Acute on chronic respiratory failure ([place])   SECONDARY DIAGNOSIS:   Past Medical History  Diagnosis Date  . Discontinued smoking     HOSPITAL COURSE:    60 year old male who is initially brought in with altered mental status and found to have pneumonia and mucous plug and was subsequently intubated and now extubated.   1. Acute respiratory failure: This was due to PNA and mucus plug. This is resolved. Patient has now been extubated. He has completed 9 day treatment for community-acquired pneumonia.  2. Septic shock: With hypotension. Patient remains to have low blood pressure but map is greater than 65.Marland Kitchen Blood pressure medications have been discontinued.   3. Acute renal failure secondary to ATN in the setting of pneumonia: Creatinine has improved.  4. Severe hypernatremia: Sodium level is 138 off of D5.  5. Anemia of chronic disease: Hemoglobin is at 9 this am. He was not transfused.  6. Hypokalemia: resolved   7. Moderate aspiration risk. Continue Dys. 1 w/ Nectar liquids; strict aspiration precautions; feeding assistance at meals; rest breaks as necessary.  Medication  Administration: Crushed with puree    Patient will be discharged to group home with hospice   DISCHARGE CONDITIONS AND DIET:  Guarded in addition Diet as above  CONSULTS OBTAINED:  Treatment Team:  Munsoor Holley Raring, MD  DRUG ALLERGIES:  No Known Allergies  DISCHARGE MEDICATIONS:   Current Discharge Medication List    START taking these medications   Details  acetaminophen (TYLENOL) 650 MG suppository Place 1 suppository (650 mg total) rectally every 6 (six) hours as needed for mild pain, moderate pain or fever. Qty: 12 suppository, Refills: 0    bisacodyl (DULCOLAX) 10 MG suppository Place 1 suppository (10 mg total) rectally daily as needed for moderate constipation. Qty: 12 suppository, Refills: 0    ipratropium-albuterol (DUONEB) 0.5-2.5 (3) MG/3ML SOLN Take 3 mLs by nebulization every 4 (four) hours as needed. Qty: 30 mL, Refills: 0    midodrine (PROAMATINE) 5 MG tablet Take 1 tablet (5 mg total) by mouth 2 (two) times daily with a meal. Qty: 30 tablet, Refills: 0    prochlorperazine (COMPAZINE) 25 MG suppository Place 1 suppository (25 mg total) rectally every 8 (eight) hours as needed for nausea or vomiting. Qty: 6 suppository, Refills: 0      CONTINUE these medications which have CHANGED   Details  levETIRAcetam (KEPPRA) 100 MG/ML solution Take 5 mLs (500 mg total) by mouth 2 (two) times daily. Qty: 473 mL, Refills: 0    LORazepam (ATIVAN) 0.5 MG tablet Take 1 tablet (0.5 mg total) by mouth 2 (two) times daily as needed (anxiety or agitation). Qty: 30 tablet, Refills: 0  sertraline (ZOLOFT) 50 MG tablet Take 1 tablet (50 mg total) by mouth at bedtime. Qty: 30 tablet, Refills: 0      STOP taking these medications     aspirin 325 MG tablet      carvedilol (COREG) 6.25 MG tablet      fluticasone (FLONASE) 50 MCG/ACT nasal spray      loratadine (CLARITIN) 10 MG tablet      risperiDONE (RISPERDAL) 1 MG tablet               Today   CHIEF  COMPLAINT:  Patient non verbal this total care   VITAL SIGNS:  Blood pressure 84/48, pulse 57, temperature 98.5 F (36.9 C), temperature source Oral, resp. rate 19, height 5\' 7"  (1.702 m), weight 62.4 kg (137 lb 9.1 oz), SpO2 99 %.   REVIEW OF SYSTEMS:  Review of Systems  Constitutional: Negative for fever, chills and malaise/fatigue.  HENT: Negative for ear discharge, ear pain, hearing loss, nosebleeds and sore throat.   Eyes: Negative for blurred vision and pain.  Respiratory: Negative for cough, hemoptysis, shortness of breath and wheezing.   Cardiovascular: Negative for chest pain, palpitations and leg swelling.  Gastrointestinal: Negative for nausea, vomiting, abdominal pain, diarrhea and blood in stool.  Genitourinary: Negative for dysuria.  Musculoskeletal: Negative for back pain.  Neurological: Negative for dizziness, tremors, speech change, focal weakness, seizures and headaches.  Endo/Heme/Allergies: Does not bruise/bleed easily.  Psychiatric/Behavioral: Negative for depression, suicidal ideas and hallucinations.     PHYSICAL EXAMINATION:  GENERAL:  60 y.o.-year-old patient lying in the bed with no acute distress.  NECK:  Supple, no jugular venous distention. No thyroid enlargement, no tenderness.  LUNGS: Normal breath sounds bilaterally, no wheezing, rales,rhonchi  No use of accessory muscles of respiration.  CARDIOVASCULAR: S1, S2 normal. No murmurs, rubs, or gallops.  ABDOMEN: Soft, non-tender, non-distended. Bowel sounds present. No organomegaly or mass.  EXTREMITIES: No pedal edema, cyanosis, or clubbing.  PSYCHIATRIC: The patient opens his eyes at times SKIN: No obvious rash, lesion, or ulcer.   DATA REVIEW:   CBC  Recent Labs Lab 02/14/16 0507  WBC 4.8  HGB 7.4*  HCT 21.8*  PLT 278    Chemistries   Recent Labs Lab 02/09/16 0551  02/14/16 0507  NA 150*  < > 139  K 3.8  < > 4.0  CL 125*  < > 110  CO2 24  < > 25  GLUCOSE 182*  < > 100*  BUN  28*  < > 16  CREATININE 0.93  < > 0.97  CALCIUM 8.9  < > 8.2*  MG 2.0  --   --   < > = values in this interval not displayed.  Cardiac Enzymes No results for input(s): TROPONINI in the last 168 hours.  Microbiology Results  @[email or web address] @  RADIOLOGY:  No results found.     Stable for discharge  Patient should follow up with hospice  CODE STATUS:     Code Status Orders        Start     Ordered   02/01/16 1639  Do not attempt resuscitation (DNR)   Continuous    Question Answer Comment  In the event of cardiac or respiratory ARREST Do not call a "code blue"   In the event of cardiac or respiratory ARREST Do not perform Intubation, CPR, defibrillation or ACLS   In the event of cardiac or respiratory ARREST Use medication by any route, position, wound care, and other measures  to relive pain and suffering. May use oxygen, suction and manual treatment of airway obstruction as needed for comfort.   Comments Pressors are OK      02/01/16 1638    Code Status History    Date Active Date Inactive Code Status Order ID Comments User Context   02/01/2016  3:48 PM 02/01/2016  4:38 PM Full Code [id number]  Theodoro Grist, MD Inpatient      TOTAL TIME TAKING CARE OF THIS PATIENT: 35 minutes.    Note: This dictation was prepared with Dragon dictation along with smaller phrase technology. Any transcriptional errors that result from this process are unintentional.  Sergio Zawislak M.D on 02/14/2016 at 12:48 PM  Between 7am to 6pm - Pager - [phone] After 6pm go to [email or web address] - password [email or web address] [facility]  Office  [phone]  CC: Primary care physician; Vista Mink, FNP

## 2016-02-13 NOTE — Progress Notes (Signed)
PT Cancellation Note  Patient Details Name: [name] MRN: [id number] DOB: 17-Dec-1955   Cancelled Treatment:    Reason Eval/Treat Not Completed: Other (comment) Attempted to evaluate pt x3. Pt still combative and unwilling to participate in therapy. When attempting to move LE, pt becomes very agitated and growls at therapist. RN in room. Not appropriate for therapy evaluation at this time. Will DC orders. Please reorder when pt able to participate.    Sherral Hammers 02/13/2016, 3:29 PM M. Barnett Abu, SPT

## 2016-02-13 NOTE — Progress Notes (Signed)
Pt BP low, MD Dr. Bridgett Larsson notified. Orders given for 21mL NS bolus. RN will administer and continue to monitor. Rachael Fee, RN

## 2016-02-13 NOTE — Care Management Note (Signed)
Case Management Note  Patient Details  Name: [name] MRN: [id number] Date of Birth: 07-21-1956  Subjective/Objective: Spoke with Mariann Laster at [facility]. She is agreeable that patient would benefit from [place]. They use [facility]. TC to [place] at [facility]. She states they can do SN and PT but no home health aide due to facility having the staff to provide that care. Will fax orders to agency [phone]).                  Action/Plan: [facility] SN/ PT  Expected Discharge Date:                  Expected Discharge Plan:  Group Home [facility])  In-House Referral:  Clinical Social Work  Discharge planning Services  CM Consult  Post Acute Care Choice:  Home Health Choice offered to:  [facility] POA / Guardian  DME Arranged:    DME Agency:     HH Arranged:  RN/PT [place] Agency:  [facility]  Status of Service:  Completed, signed off  Medicare Important Message Given:  Yes Date Medicare IM Given:    Medicare IM give by:    Date Additional Medicare IM Given:    Additional Medicare Important Message give by:     If discussed at [place] of Stay Meetings, dates discussed:    Additional Comments:  Jolly Mango, RN 02/13/2016, 11:37 AM

## 2016-02-13 NOTE — NC FL2 (Addendum)
[place] LEVEL OF CARE SCREENING TOOL     IDENTIFICATION  Patient Name: [name] Birthdate: 1956-06-23 Sex: male Admission Date (Current Location): 02/01/2016  [facility] and [place] Number:  Selena Lesser  ([id number]) Facility and Address:  [facility], [address], [place], [place] [address]      Provider Number: [id number]  Attending Physician Name and Address:  Bettey Costa, MD  Relative Name and Phone Number:       Current Level of Care: Hospital Recommended Level of Care: [facility] Prior Approval Number:    Date Approved/Denied:   PASRR Number:  ([id number])  Discharge Plan: Domiciliary (Rest home)    Current Diagnoses: Patient Active Problem List   Diagnosis Date Noted  . Collapse of lung   . Mucus plugging of bronchi   . Acute on chronic respiratory failure ([place])   . Pressure ulcer 02/04/2016  . Right lower lobe pneumonia 02/01/2016  . Septic shock ([place]) 02/01/2016  . Hypernatremia 02/01/2016  . Acute renal failure (ARF) ([place]) 02/01/2016  . Lactic acidosis 02/01/2016  . Metabolic encephalopathy [id number]  . Thrombocytopenia ([place]) 02/01/2016  . Anemia 02/01/2016  . Sepsis ([place]) 02/01/2016    Orientation RESPIRATION BLADDER Height & Weight     Self  Normal Continent Weight: 137 lb 9.1 oz (62.4 kg) Height:  5\' 7"  (170.2 cm)  BEHAVIORAL SYMPTOMS/MOOD NEUROLOGICAL BOWEL NUTRITION STATUS   (None)  (None) Continent Diet (DYS1)  AMBULATORY STATUS COMMUNICATION OF NEEDS Skin   Limited Assist Verbally Other (Comment) (Pressure Ulcer Deep Tissue Injury- Bilateral Heel; Pressure )                       Personal Care Assistance Level of Assistance  Bathing, Feeding, Dressing Bathing Assistance: Limited assistance Feeding assistance: Limited assistance Dressing Assistance: Limited assistance Total Care Assistance: Limited assistance   Functional Limitations Info  Sight, Hearing, Speech Sight Info:  Adequate Hearing Info: Adequate Speech Info: Adequate    SPECIAL CARE FACTORS FREQUENCY                        Contractures Contractures Info: Not present    Additional Factors Info  Code Status, Allergies, Insulin Sliding Scale Code Status Info:  (DNR) Allergies Info:  (No Known Allergies )   Insulin Sliding Scale Info:  (insulin aspart (novoLOG) injection 0-9 Units 0-9 Units, Subcutaneous, 6 times per day )       Discharge Medications: DISCHARGE MEDICATIONS:   Current Discharge Medication List    START taking these medications   Details  ipratropium-albuterol (DUONEB) 0.5-2.5 (3) MG/3ML SOLN Take 3 mLs by nebulization every 6 (six) hours as needed. Qty: 360 mL, Refills: 0    midodrine 5 mg by mouth 2 (two) times daily with meal.  Compazine 25 mg suppository 25 mg Q8 hours if needed for nausea and vomiting. Tylenol 650 mg suppository rectally every 6 hours as needed fo rmild pain, moderate pain or fever. ducolax 10 mg suppository rectally daily as needed for moderate constipation.  CONTINUE these medications which have CHANGED   Details  levETIRAcetam (KEPPRA) 100 MG/ML solution Take 5 mLs (500 mg total) by mouth 2 (two) times daily. Qty: 473 mL, Refills: 12    LORazepam (ATIVAN) 0.5 MG tablet Take 1 tablet (0.5 mg total) by mouth 2 (two) times daily as needed (anxiety or agitation). Qty: 30 tablet, Refills: 0    sertraline (ZOLOFT) 50 MG tablet Take 1  tablet (50 mg total) by mouth at bedtime. Qty: 30 tablet, Refills: 0      CONTINUE these medications which have NOT CHANGED   Details  aspirin 325 MG tablet Take 325 mg by mouth daily.      STOP taking these medications     carvedilol (COREG) 6.25 MG tablet      fluticasone (FLONASE) 50 MCG/ACT nasal spray      loratadine (CLARITIN) 10 MG tablet      risperiDONE (RISPERDAL) 1 MG tablet                  Relevant Imaging  Results:  Relevant Lab Results:   Additional Information  (SSN [id number])  Lorenso Quarry Sunkins, LCSW

## 2016-02-13 NOTE — Progress Notes (Signed)
Dr. Benjie Karvonen aware of BP following second bolus. Instructed to order 2.5mg  midodrine PO BID

## 2016-02-13 NOTE — Progress Notes (Signed)
[name] reports she cannot accept patient back today due to his blood pressure being "low". She reports that she'll reevaluate patient tomorrow. CSW called MD Mody and informed her of above. CSW will continue to follow and assist.   [name], MSW, [place] Work Department [phone]

## 2016-02-13 NOTE — Progress Notes (Signed)
BP still low, another bolus ordered. Instructed to cancel discharge.

## 2016-02-13 NOTE — Progress Notes (Signed)
Pt's BP remains low, MD Dr. Jannifer Franklin notified. Orders given for an additional one time dose of midodrine. RN will administer and continue to monitor. Rachael Fee, RN

## 2016-02-13 NOTE — Progress Notes (Signed)
CSW was informed by MD Benjie Karvonen that patient will be ready for discharge. CSW informed her that Mariann Laster- Nurse at [facility] wants to evaluate patient and stated she'll be at [facility] at around 11:30AM/ 12:00PM. CSW called Mariann Laster to confirm that she's still planning to come to [facility] She stated that she will be here in about 2 hours. CSW will continue to follow and assist.   Ernest Pine, MSW, [place] Work Department [phone]

## 2016-02-13 NOTE — Progress Notes (Signed)
Pt to discharge today per Dr. Benjie Karvonen. Instructed to remove foley and let him void on his own. Group home to come assess patient around noon to see if they are able to accept him back.

## 2016-02-14 LAB — CBC
HEMATOCRIT: 21.8 % — AB (ref 40.0–52.0)
Hemoglobin: 7.4 g/dL — ABNORMAL LOW (ref 13.0–18.0)
MCH: 32.3 pg (ref 26.0–34.0)
MCHC: 34.1 g/dL (ref 32.0–36.0)
MCV: 94.8 fL (ref 80.0–100.0)
Platelets: 278 10*3/uL (ref 150–440)
RBC: 2.3 MIL/uL — ABNORMAL LOW (ref 4.40–5.90)
RDW: 15.1 % — AB (ref 11.5–14.5)
WBC: 4.8 10*3/uL (ref 3.8–10.6)

## 2016-02-14 LAB — GLUCOSE, CAPILLARY
Glucose-Capillary: 98 mg/dL (ref 65–99)
Glucose-Capillary: 100 mg/dL — ABNORMAL HIGH (ref 65–99)

## 2016-02-14 LAB — BASIC METABOLIC PANEL
Anion gap: 4 — ABNORMAL LOW (ref 5–15)
BUN: 16 mg/dL (ref 6–20)
CALCIUM: 8.2 mg/dL — AB (ref 8.9–10.3)
CO2: 25 mmol/L (ref 22–32)
CREATININE: 0.97 mg/dL (ref 0.61–1.24)
Chloride: 110 mmol/L (ref 101–111)
GFR calc non Af Amer: >60 mL/min (ref >60)
Glucose, Bld: 100 mg/dL — ABNORMAL HIGH (ref 65–99)
Potassium: 4 mmol/L (ref 3.5–5.1)
Sodium: 139 mmol/L (ref 135–145)

## 2016-02-14 LAB — CORTISOL-AM, BLOOD: Cortisol - AM: 8.4 ug/dL (ref 6.7–22.6)

## 2016-02-14 LAB — T3, FREE: T3, Free: 2.4 pg/mL (ref 2.0–4.4)

## 2016-02-14 LAB — LEVETIRACETAM LEVEL: LEVETIRACETAM: 12.7 ug/mL (ref 10.0–40.0)

## 2016-02-14 MED ORDER — IPRATROPIUM-ALBUTEROL 0.5-2.5 (3) MG/3ML IN SOLN: 3.0000 mL | RESPIRATORY_TRACT | Status: DC | PRN

## 2016-02-14 MED ORDER — FLUDROCORTISONE ACETATE 0.1 MG PO TABS
0.1000 mg | ORAL_TABLET | Freq: Every day | ORAL | Status: DC
  Filled 2016-02-14: qty 1

## 2016-02-14 MED ORDER — RISPERIDONE 1 MG PO TABS: 0.5000 mg | ORAL_TABLET | Freq: Every day | ORAL | Status: DC

## 2016-02-14 MED ORDER — MIDODRINE HCL 5 MG PO TABS: 5.0000 mg | ORAL_TABLET | Freq: Two times a day (BID) | ORAL | Status: DC

## 2016-02-14 MED ORDER — BISACODYL 10 MG RE SUPP: 10.0000 mg | Freq: Every day | RECTAL | Status: DC | PRN

## 2016-02-14 MED ORDER — ACETAMINOPHEN 650 MG RE SUPP: 650.0000 mg | Freq: Four times a day (QID) | RECTAL | Status: DC | PRN

## 2016-02-14 MED ORDER — MIDODRINE HCL 5 MG PO TABS
5.0000 mg | ORAL_TABLET | Freq: Two times a day (BID) | ORAL | Status: DC
Start: 2016-02-14 — End: 2016-02-14
  Filled 2016-02-14: qty 1

## 2016-02-14 MED ORDER — LEVETIRACETAM 100 MG/ML PO SOLN: 500.0000 mg | Freq: Two times a day (BID) | ORAL | Status: DC

## 2016-02-14 MED ORDER — PROCHLORPERAZINE 25 MG RE SUPP: 25.0000 mg | Freq: Three times a day (TID) | RECTAL | Status: DC | PRN

## 2016-02-14 MED ORDER — PROCHLORPERAZINE 25 MG RE SUPP
25.0000 mg | Freq: Three times a day (TID) | RECTAL | Status: DC | PRN
  Filled 2016-02-14: qty 1

## 2016-02-14 NOTE — Progress Notes (Signed)
Spoke with Dr. Benjie Karvonen regarding new orders and discharge plan. Patient is going back to group home with hospice. She wants me to try to go ahead and give a dose of midodrine now if patient will take it.

## 2016-02-14 NOTE — Progress Notes (Signed)
Packet prepared by CSW who has already spoken with facility. EMS called for transport. Patient changed to transport gown and sheet. Attempted to remove IVs but patient became combative, will wait til EMS gets here to take out.

## 2016-02-14 NOTE — Progress Notes (Signed)
[place] at [place] NAME: [name]    MR#:  [id number]  DATE OF BIRTH:  December 10, 1955  SUBJECTIVE:   Patient refused medications this morning. He now agrees to take them. His blood pressure systolic remains between 80 and 90.  REVIEW OF SYSTEMS:    Review of Systems  Constitutional: Negative for fever, chills and malaise/fatigue.  HENT: Negative for ear discharge, ear pain, hearing loss, nosebleeds and sore throat.   Eyes: Negative for blurred vision and pain.  Respiratory: Negative for cough, hemoptysis, shortness of breath and wheezing.   Cardiovascular: Negative for chest pain, palpitations and leg swelling.  Gastrointestinal: Negative for nausea, vomiting, abdominal pain, diarrhea and blood in stool.  Genitourinary: Negative for dysuria.  Musculoskeletal: Negative for back pain.  Neurological: Negative for dizziness, tremors, speech change, focal weakness, seizures and headaches.  Endo/Heme/Allergies: Does not bruise/bleed easily.  Psychiatric/Behavioral: Negative for depression, suicidal ideas and hallucinations.    Tolerating Diet: yes      DRUG ALLERGIES:  No Known Allergies  VITALS:  Blood pressure 84/48, pulse 57, temperature 98.5 F (36.9 C), temperature source Oral, resp. rate 19, height 5\' 7"  (1.702 m), weight 62.4 kg (137 lb 9.1 oz), SpO2 99 %.  PHYSICAL EXAMINATION:   Physical Exam  Constitutional: No distress.  frail  HENT:  Head: Normocephalic.  Eyes: No scleral icterus.  Neck: No JVD present. No tracheal deviation present.  Cardiovascular: Normal rate, regular rhythm and normal heart sounds.  Exam reveals no gallop and no friction rub.   No murmur heard. Pulmonary/Chest: Effort normal and breath sounds normal. No respiratory distress. He has no wheezes. He has no rales. He exhibits no tenderness.  Abdominal: Soft. Bowel sounds are normal. He exhibits no distension and no mass. There is no  tenderness. There is no rebound and no guarding.  Musculoskeletal: Normal range of motion. He exhibits no edema.  Neurological: He is alert.  Skin: Skin is warm. No rash noted. No erythema.      LABORATORY PANEL:   CBC  Recent Labs Lab 02/14/16 0507  WBC 4.8  HGB 7.4*  HCT 21.8*  PLT 278   ------------------------------------------------------------------------------------------------------------------  Chemistries   Recent Labs Lab 02/09/16 0551  02/14/16 0507  NA 150*  < > 139  K 3.8  < > 4.0  CL 125*  < > 110  CO2 24  < > 25  GLUCOSE 182*  < > 100*  BUN 28*  < > 16  CREATININE 0.93  < > 0.97  CALCIUM 8.9  < > 8.2*  MG 2.0  --   --   < > = values in this interval not displayed. ------------------------------------------------------------------------------------------------------------------  Cardiac Enzymes No results for input(s): TROPONINI in the last 168 hours. ------------------------------------------------------------------------------------------------------------------  RADIOLOGY:  No results found.   ASSESSMENT AND PLAN:   60 year old male who is initially brought in with altered mental status and found to have pneumonia and mucous plug and was subsequently intubated and now extubated.   1. Acute respiratory failure: This was due to PNA and mucus plug. This is resolved. Patient has now been extubated. He has completed 9 day treatment for community-acquired pneumonia.   2. Septic shock: With hypotension. Patient remains to have low blood pressure appears asymptomatic. Blood pressure medications have been discontinued.  Check cortisol. Admitted drainage and [place].  3. Acute renal failure secondary to ATN in the setting of pneumonia: Creatinine has improved.  4. Severe hypernatremia: Sodium level  has improved off of D5. 5. Anemia of chronic disease: Hemoglobin is relatively stable. 6. Hypokalemia: resolved   7. Moderate aspiration risk.  Continue Dys. 1 w/ Nectar liquids; strict aspiration precautions; feeding assistance at meals; rest breaks as necessary.       CODE STATUS: DNR  TOTAL TIME TAKING CARE OF THIS PATIENT: 25 minutes.     POSSIBLE D/C today, DEPENDING ON CLINICAL CONDITION.   [name] M.D on 02/13/2016 at 12:14 PM  Between 7am to 6pm - Pager - [phone] After 6pm go to [email or web address] - password [email or web address] [place] [facility]  Office  [phone]  CC: Primary care physician; Vista Mink, FNP  Note: This dictation was prepared with Dragon dictation along with smaller phrase technology. Any transcriptional errors that result from this process are unintentional.

## 2016-02-14 NOTE — Progress Notes (Addendum)
Palliative Medicine Inpatient Consult Follow Up Note   Name: [name] Date: 02/14/2016 MRN: [id number]  DOB: 1956/06/10  Referring Physician: Bettey Costa, MD  Palliative Care consult requested for this 60 y.o. male for goals of medical therapy in patient with pneumonia.  BRIEF HISTORY: See history provided in note from me on 3/28.  At that time, family had already made it clear that they wanted intubation and bronchoscopy with removal of mucous plug.  But he was to remain DNR status.  I have followed pt 'at a distance' since then as outlined in that first consult note --and yesterday noted that he was to be discharged to a facility. He was intubated orally for 5 days and then extubated.    Yesterday, he did eat a good lunch per notes and it was planned that as long as his BP was above 90 then the plan was to DC him back to his group home.   However, he continued to have lower and lower blood pressures. He had fluids and midodrine but these did not help.  There is no desire to restart pressors.  His baseline is total care and he has to be fed.  Offers little communication.   He is on a dysphagia 1 diet with nectar liquids.  He has been combative with PT so no meaningful PT has been accomplished.   He is refusing all po meds. He has been encephalopathic all along.  He may have 'had a good lunch' yesterday, but I's and O's show little intake of fluids.  He is resisting/ refusing some meds, but he perked up when the family care home manager came to see him and offered him a cigarette yesterday.  The Care Manager just called Mariann Laster at the [facility] and she stated it would be fine for him to go back there as a HOSPICE PT.  I then called the patient's wife, Derald Torti, who is decision maker for pt.  She said she preferred to have pt go back to the family care home as a Hospice Pt. I mentioned that he should have some of his meds changed to comfort focused meds.  She agreed with this.    I  will therefore do the DC med recs. All has been discussed with care mgr and with pts attending.      REVIEW OF SYSTEMS:  Patient is not able to provide ROS due to confusion  CODE STATUS: DNR   PAST MEDICAL HISTORY: Past Medical History  Diagnosis Date  . Discontinued smoking     PAST SURGICAL HISTORY: History reviewed. No pertinent past surgical history.  Vital Signs: BP 82/43 mmHg  Pulse 65  Temp(Src) 98.5 F (36.9 C) (Oral)  Resp 18  Ht 5\' 7"  (1.702 m)  Wt 62.4 kg (137 lb 9.1 oz)  BMI 21.54 kg/m2  SpO2 92% Filed Weights   02/07/16 0500 02/08/16 0510 02/09/16 0500  Weight: 62 kg (136 lb 11 oz) 59.3 kg (130 lb 11.7 oz) 62.4 kg (137 lb 9.1 oz)    Estimated body mass index is 21.54 kg/(m^2) as calculated from the following:   Height as of this encounter: 5\' 7"  (1.702 m).   Weight as of this encounter: 62.4 kg (137 lb 9.1 oz).  PHYSICAL EXAM: Confused in medical bed Eyes closed No JVD or TM Hrt rrr Lungs with decreased BS bases Abd soft nt   LABS: CBC:    Component Value Date/Time   WBC 4.8 02/14/2016 0507  HGB 7.4* 02/14/2016 0507   HCT 21.8* 02/14/2016 0507   PLT 278 02/14/2016 0507   MCV 94.8 02/14/2016 0507   NEUTROABS 5.1 02/10/2016 0458   LYMPHSABS 1.0 02/10/2016 0458   MONOABS 0.2 02/10/2016 0458   EOSABS 0.1 02/10/2016 0458   BASOSABS 0.0 02/10/2016 0458   Comprehensive Metabolic Panel:    Component Value Date/Time   NA 139 02/14/2016 0507   K 4.0 02/14/2016 0507   CL 110 02/14/2016 0507   CO2 25 02/14/2016 0507   BUN 16 02/14/2016 0507   CREATININE 0.97 02/14/2016 0507   GLUCOSE 100* 02/14/2016 0507   CALCIUM 8.2* 02/14/2016 0507   AST 56* 02/01/2016 0936   ALT 47 02/01/2016 0936   ALKPHOS 79 02/01/2016 0936   BILITOT 1.1 02/01/2016 0936   PROT 7.1 02/01/2016 0936   ALBUMIN 3.5 02/01/2016 0936    More than 50% of the visit was spent in counseling/coordination of care: YES  Time Spent:  35 min

## 2016-02-14 NOTE — Care Management Important Message (Signed)
Important Message  Patient Details  Name: [name] MRN: [id number] Date of Birth: [birth year]   Medicare Important Message Given:  Yes    [name] [name] 02/14/2016, 1:46 PM

## 2016-02-14 NOTE — Progress Notes (Signed)
Attempted to feed patient lunch and give the midodrine per Dr. Gardiner Coins request. Patient agresively refused. Will continue to monitor and prepare for discharge per orders.

## 2016-02-14 NOTE — Care Management (Signed)
Case discussed with Dr. Benjie Karvonen. BP remains low and has been unresponsive to IVF. HGB 7.4, no intervention at this time.  Patient confused and combative. Case discussed with dr. Megan Salon. She is coming to complete palliative consult for possible hospice care??

## 2016-02-14 NOTE — Care Management (Signed)
TC to [employer] at [facility]. Discussed patient's current condition. She states patient can return to facility with hospice care. She states wife is primary Media planner. She has voiced concerns that she may want to move him closer to [facility]. Dr. Megan Salon will call wife.

## 2016-02-14 NOTE — Progress Notes (Signed)
Pt's BP remains low, MD. Dr. Jannifer Franklin notified. Will monitor pt's BP for the remainder of the shift (at 0300 and 0500). Will notify MD if systolic drops below 80. Rachael Fee, RN

## 2016-02-14 NOTE — Clinical Social Work Note (Signed)
Patient to discharge today to return to [facility]. CSW contacted Lavada Mesi, the owner, and she stated that she will be able to take patient back today and understands that he will have hospice following. RN CM has arranged the hospice. Patient's wife aware of discharge and Lavada Mesi is requesting transport via EMS.  Shela Leff MSW,LCSW [phone]

## 2016-02-14 NOTE — Progress Notes (Signed)
Per Dr. Benjie Karvonen, patient does not need to be on telemetry; can be medsurg patient.

## 2016-02-14 NOTE — Care Management (Signed)
Spoke with wife. Offered choice of Hospice agencies. She chooses [facility]. TC to [place] at [facility]. Patient will need a hospital bed and wheelchair. Notified Santiago Glad with [place] of referral and DME needs. It is anticipated that patient will discharge back to [facility] today with hospice.

## 2016-02-14 NOTE — Progress Notes (Signed)
New referral for Hospice and Palliative Care of [place] services at [facility] following discharge received from [facility]. Mr. Feickert was admitted to [facility] on 3/24 after being found unresponsive at the care home. He was found to be in septic shock related to right lower lobe pneumonia. His hospital course has included Bipap, vasopressors, intubation, bronchoscopy with mucous plug removal, a thoracentesis and hypernatremia. He has remained encephalopathic. Per chart note review and discussion with staff RN patient can be combative and refuses care and medications. He has declined to work with physical therapy. Writer was contacted regarding hospice services following a Palliative Care consult completed by Dr. Megan Salon. Plan is for Mr. Fieldhouse to be discharged back to the care home via EMS today and to be admitted to hospice services. His blood pressure remains low.  Writer spoke with Care home manager Mariann Laster, who confirmed need of a hospital bed, care home address and contact number.  Hospital bed ordered, patient information faxed to referral intake. No past medical history available in EPIC.  Patient seen lying in bed, opened his eyes to his name, but did not interact with Probation officer. Patient to discharge via EMS, signed DNR in place in discharge packet. Hospital care team all aware of and in agreement.  Thank you for the opportunity to be involved in the care of this patient. Flo Shanks RN, BSN, [place] and [facility] of [place], [facility] [phone]

## 2016-02-14 NOTE — Progress Notes (Signed)
Dr. Benjie Karvonen aware of BP and Hgb levels. Notified her that patient is refusing all PO meds and being combative when attempting to give them. Instructed to just try again later if he seems to calm down. Will continue to monitor.

## 2016-02-14 NOTE — Progress Notes (Signed)
[place] [facility] [place] NAME: [name]    MR#:  [id number]  DATE OF BIRTH:  August 31, 1956  SUBJECTIVE:  No acute events overnight.  REVIEW OF SYSTEMS:    Review of Systems  Unable to perform ROS: medical condition    Tolerating Diet: yes      DRUG ALLERGIES:  No Known Allergies  VITALS:  Blood pressure 84/48, pulse 57, temperature 98.5 F (36.9 C), temperature source Oral, resp. rate 19, height 5\' 7"  (1.702 m), weight 62.4 kg (137 lb 9.1 oz), SpO2 99 %.  PHYSICAL EXAMINATION:   Physical Exam  Constitutional: He is well-developed, well-nourished, and in no distress. No distress.  HENT:  Head: Normocephalic.  Eyes: No scleral icterus.  Neck: No JVD present. No tracheal deviation present.  Cardiovascular: Normal rate, regular rhythm and normal heart sounds.  Exam reveals no gallop and no friction rub.   No murmur heard. Pulmonary/Chest: Effort normal and breath sounds normal. No respiratory distress. He has no wheezes. He has no rales. He exhibits no tenderness.  Abdominal: Soft. Bowel sounds are normal. He exhibits no distension and no mass. There is no tenderness. There is no rebound and no guarding.  Musculoskeletal: Normal range of motion. He exhibits no edema.  Skin: Skin is warm. No rash noted. No erythema.      LABORATORY PANEL:   CBC  Recent Labs Lab 02/14/16 0507  WBC 4.8  HGB 7.4*  HCT 21.8*  PLT 278   ------------------------------------------------------------------------------------------------------------------  Chemistries   Recent Labs Lab 02/09/16 0551  02/14/16 0507  NA 150*  < > 139  K 3.8  < > 4.0  CL 125*  < > 110  CO2 24  < > 25  GLUCOSE 182*  < > 100*  BUN 28*  < > 16  CREATININE 0.93  < > 0.97  CALCIUM 8.9  < > 8.2*  MG 2.0  --   --   < > = values in this interval not  displayed. ------------------------------------------------------------------------------------------------------------------  Cardiac Enzymes No results for input(s): TROPONINI in the last 168 hours. ------------------------------------------------------------------------------------------------------------------  RADIOLOGY:  No results found.   ASSESSMENT AND PLAN:   60 year old male who is initially brought in with altered mental status and found to have pneumonia and mucous plug and was subsequently intubated and now extubated.   1. Acute respiratory failure: This was due to PNA and mucus plug. This is resolved. Patient has now been extubated. He has completed 9 day treatment for community-acquired pneumonia.   2. Septic shock: With hypotension. Patient remains to have low blood pressure appears asymptomatic. Blood pressure medications have been discontinued.  Consider adding midodrine.  3. Acute renal failure secondary to ATN in the setting of pneumonia: Creatinine has improved.  4. Severe hypernatremia: Sodium level has improved off of D5. 5. Anemia of chronic disease: Hemoglobin is relatively stable. 6. Hypokalemia: resolved   7. Moderate aspiration risk. Continue Dys. 1 w/ Nectar liquids; strict aspiration precautions; feeding assistance at meals; rest breaks as necessary.       CODE STATUS: DNR  TOTAL TIME TAKING CARE OF THIS PATIENT: 24 minutes.     POSSIBLE D/C today, DEPENDING ON CLINICAL CONDITION.   [name] M.D on 02/13/2016 at 12:08 PM  Between 7am to 6pm - Pager - [phone] After 6pm go to [email or web address] - password [email or web address] [facility]  Office  [phone]  CC: Primary care physician; Vista Mink, FNP  Note: This  dictation was prepared with Dragon dictation along with smaller phrase technology. Any transcriptional errors that result from this process are unintentional.

## 2016-02-14 NOTE — Discharge Summary (Addendum)
[place] [facility] [place] NAME: [name]    MR#:  [id number]  DATE OF BIRTH:  [birth year]  DATE OF ADMISSION:  02/01/2016 ADMITTING PHYSICIAN: Theodoro Grist, MD  DATE OF DISCHARGE: 02/14/2016  PRIMARY CARE PHYSICIAN: Vista Mink, FNP    ADMISSION DIAGNOSIS:  Hypernatremia [E87.0] ARF (acute renal failure) (HCC) [N17.9] Right lower lobe pneumonia [J18.9] Sepsis, due to unspecified organism ([place]) [A41.9]  DISCHARGE DIAGNOSIS:  Principal Problem:   Septic shock ([place]) Active Problems:   Right lower lobe pneumonia   Hypernatremia   Acute renal failure (ARF) (HCC)   Lactic acidosis   Metabolic encephalopathy   Thrombocytopenia (HCC)   Anemia   Sepsis (HCC)   Pressure ulcer   Collapse of lung   Mucus plugging of bronchi   Acute on chronic respiratory failure ([place])   SECONDARY DIAGNOSIS:   Past Medical History  Diagnosis Date  . Discontinued smoking     HOSPITAL COURSE:    60 year old male who is initially brought in with altered mental status and found to have pneumonia and mucous plug and was subsequently intubated and now extubated.   1. Acute respiratory failure: This was due to PNA and mucus plug. This is resolved. Patient has now been extubated. He has completed 9 day treatment for community-acquired pneumonia.   2. Septic shock: With hypotension. Patient remains low blood pressure. Patient is started on Midrin and Florinef. She appears to be asymptomatic. Patient will be discharged with hospice back to group home.  3. Acute renal failure secondary to ATN in the setting of pneumonia: Creatinine has improved.  4. Severe hypernatremia: Sodium level has improved off of fluids. 5. Anemia of chronic disease: Hemoglobin remained relatively stable and did not require blood transfusion during hospital physician..  6. Hypokalemia: resolved   7. Moderate aspiration risk. Continue Dys. 1 w/ Nectar liquids;  strict aspiration precautions; feeding assistance at meals; rest breaks as necessary.  Medication Administration: Crushed with puree    DISCHARGE CONDITIONS AND DIET:  At baseline Total care at group home Diet as above  CONSULTS OBTAINED:  Treatment Team:  Munsoor Holley Raring, MD  DRUG ALLERGIES:  No Known Allergies  DISCHARGE MEDICATIONS:   Current Discharge Medication List    START taking these medications   Details  acetaminophen (TYLENOL) 650 MG suppository Place 1 suppository (650 mg total) rectally every 6 (six) hours as needed for mild pain, moderate pain or fever. Qty: 12 suppository, Refills: 0    bisacodyl (DULCOLAX) 10 MG suppository Place 1 suppository (10 mg total) rectally daily as needed for moderate constipation. Qty: 12 suppository, Refills: 0    ipratropium-albuterol (DUONEB) 0.5-2.5 (3) MG/3ML SOLN Take 3 mLs by nebulization every 4 (four) hours as needed. Qty: 30 mL, Refills: 0    midodrine (PROAMATINE) 5 MG tablet Take 1 tablet (5 mg total) by mouth 2 (two) times daily with a meal. Qty: 30 tablet, Refills: 0    prochlorperazine (COMPAZINE) 25 MG suppository Place 1 suppository (25 mg total) rectally every 8 (eight) hours as needed for nausea or vomiting. Qty: 6 suppository, Refills: 0      CONTINUE these medications which have CHANGED   Details  levETIRAcetam (KEPPRA) 100 MG/ML solution Take 5 mLs (500 mg total) by mouth 2 (two) times daily. Qty: 473 mL, Refills: 0    LORazepam (ATIVAN) 0.5 MG tablet Take 1 tablet (0.5 mg total) by mouth 2 (two) times daily as needed (anxiety or agitation). Qty: 30  tablet, Refills: 0    sertraline (ZOLOFT) 50 MG tablet Take 1 tablet (50 mg total) by mouth at bedtime. Qty: 30 tablet, Refills: 0      STOP taking these medications     aspirin 325 MG tablet      carvedilol (COREG) 6.25 MG tablet      fluticasone (FLONASE) 50 MCG/ACT nasal spray      loratadine (CLARITIN) 10 MG tablet      risperiDONE  (RISPERDAL) 1 MG tablet               Today   CHIEF COMPLAINT:  Patient non verbal this total care   VITAL SIGNS:  Blood pressure 84/48, pulse 57, temperature 98.5 F (36.9 C), temperature source Oral, resp. rate 19, height 5\' 7"  (1.702 m), weight 62.4 kg (137 lb 9.1 oz), SpO2 99 %.   REVIEW OF SYSTEMS:  Review of Systems  Constitutional: Negative for fever, chills and malaise/fatigue.  HENT: Negative for ear discharge, ear pain, hearing loss, nosebleeds and sore throat.   Eyes: Negative for blurred vision and pain.  Respiratory: Negative for cough, hemoptysis, shortness of breath and wheezing.   Cardiovascular: Negative for chest pain, palpitations and leg swelling.  Gastrointestinal: Negative for nausea, vomiting, abdominal pain, diarrhea and blood in stool.  Genitourinary: Negative for dysuria.  Musculoskeletal: Negative for back pain.  Neurological: Negative for dizziness, tremors, speech change, focal weakness, seizures and headaches.  Endo/Heme/Allergies: Does not bruise/bleed easily.  Psychiatric/Behavioral: Negative for depression, suicidal ideas and hallucinations.     PHYSICAL EXAMINATION:  GENERAL:  60 y.o.-year-old patient lying in the bed with no acute distress.  NECK:  Supple, no jugular venous distention. No thyroid enlargement, no tenderness.  LUNGS: Normal breath sounds bilaterally, no wheezing, rales,rhonchi  No use of accessory muscles of respiration.  CARDIOVASCULAR: S1, S2 normal. No murmurs, rubs, or gallops.  ABDOMEN: Soft, non-tender, non-distended. Bowel sounds present. No organomegaly or mass.  EXTREMITIES: No pedal edema, cyanosis, or clubbing.  PSYCHIATRIC: The patient opens his eyes at times SKIN: No obvious rash, lesion, or ulcer.   DATA REVIEW:   CBC  Recent Labs Lab 02/14/16 0507  WBC 4.8  HGB 7.4*  HCT 21.8*  PLT 278    Chemistries   Recent Labs Lab 02/09/16 0551  02/14/16 0507  NA 150*  < > 139  K 3.8  < > 4.0  CL  125*  < > 110  CO2 24  < > 25  GLUCOSE 182*  < > 100*  BUN 28*  < > 16  CREATININE 0.93  < > 0.97  CALCIUM 8.9  < > 8.2*  MG 2.0  --   --   < > = values in this interval not displayed.  Cardiac Enzymes No results for input(s): TROPONINI in the last 168 hours.  Microbiology Results  @[id number] @  RADIOLOGY:  No results found.     Stable for discharge  Patient should follow up with PCP  CODE STATUS:     Code Status Orders        Start     Ordered   02/01/16 1639  Do not attempt resuscitation (DNR)   Continuous    Question Answer Comment  In the event of cardiac or respiratory ARREST Do not call a "code blue"   In the event of cardiac or respiratory ARREST Do not perform Intubation, CPR, defibrillation or ACLS   In the event of cardiac or respiratory ARREST Use medication by any route,  position, wound care, and other measures to relive pain and suffering. May use oxygen, suction and manual treatment of airway obstruction as needed for comfort.   Comments Pressors are OK      02/01/16 1638    Code Status History    Date Active Date Inactive Code Status Order ID Comments User Context   02/01/2016  3:48 PM 02/01/2016  4:38 PM Full Code [id number]  Theodoro Grist, MD Inpatient      TOTAL TIME TAKING CARE OF THIS PATIENT: 35 minutes.    Note: This dictation was prepared with Dragon dictation along with smaller phrase technology. Any transcriptional errors that result from this process are unintentional.  Hrithik Boschee M.D on 02/14/2016 at 2:27 PM  Between 7am to 6pm - Pager - [phone] After 6pm go to [email or web address] - password [email or web address] [facility]  Office  [phone]  CC: Primary care physician; Vista Mink, FNP

## 2016-02-26 LAB — CULTURE, FUNGUS WITHOUT SMEAR: Special Requests: NORMAL

## 2016-03-20 LAB — ACID FAST SMEAR (AFB): ACID FAST SMEAR - AFSCU2: NEGATIVE

## 2016-03-20 LAB — ACID FAST CULTURE WITH REFLEXED SENSITIVITIES (MYCOBACTERIA): Acid Fast Culture: NEGATIVE

## 2016-03-20 LAB — ACID FAST SMEAR (AFB, MYCOBACTERIA)

## 2016-03-20 LAB — ACID FAST CULTURE WITH REFLEXED SENSITIVITIES

## 2016-04-08 ENCOUNTER — Encounter: Payer: Medicare Other | Attending: Internal Medicine | Admitting: Internal Medicine

## 2016-04-08 DIAGNOSIS — L89620 Pressure ulcer of left heel, unstageable: Secondary | ICD-10-CM | POA: Insufficient documentation

## 2016-04-08 DIAGNOSIS — L89610 Pressure ulcer of right heel, unstageable: Secondary | ICD-10-CM | POA: Diagnosis present

## 2016-04-08 DIAGNOSIS — Z8673 Personal history of transient ischemic attack (TIA), and cerebral infarction without residual deficits: Secondary | ICD-10-CM | POA: Insufficient documentation

## 2016-04-08 DIAGNOSIS — Z87891 Personal history of nicotine dependence: Secondary | ICD-10-CM | POA: Diagnosis not present

## 2016-04-08 DIAGNOSIS — I70234 Atherosclerosis of native arteries of right leg with ulceration of heel and midfoot: Secondary | ICD-10-CM | POA: Diagnosis not present

## 2016-04-08 DIAGNOSIS — I70245 Atherosclerosis of native arteries of left leg with ulceration of other part of foot: Secondary | ICD-10-CM | POA: Diagnosis not present

## 2016-04-08 NOTE — Progress Notes (Signed)
KHANI, EBEL ([id number]) Visit Report for 04/08/2016 Abuse/Suicide Risk Screen Details Patient Name: [name], [name] Date of Service: 04/08/2016 8:45 AM Medical Record Patient Account Number: [id number] Number: Treating RN: Ahmed Prima 1956/02/08 (59 y.o. Other Clinician: Date of Birth/Sex: Male) Treating ROBSON, MICHAEL Primary Care Physician: [name] Physician/Extender: G Referring Physician: Weeks in Treatment: 0 Abuse/Suicide Risk Screen Items Answer ABUSE/SUICIDE RISK SCREEN: Has anyone close to you tried to hurt or harm you recentlyo No Do you feel uncomfortable with anyone in your familyo No Has anyone forced you do things that you didnot want to doo No Do you have any thoughts of harming yourselfo No Patient displays signs or symptoms of abuse and/or neglect. No Electronic Signature(s) Signed: 04/08/2016 4:11:15 PM By: Alric Quan Entered By: Alric Quan on 04/08/2016 09:11:36 Pereda, [name] ([id number]) -------------------------------------------------------------------------------- Activities of Daily Living Details Patient Name: [name] Date of Service: 04/08/2016 8:45 AM Medical Record Patient Account Number: [id number] Number: Treating RN: Ahmed Prima 1956-10-07 (59 y.o. Other Clinician: Date of Birth/Sex: Male) Treating ROBSON, Wil[place]it Primary Care Physician: [name] Physician/Extender: G Referring Physician: Weeks in Treatment: 0 Activities of Daily Living Items Answer Activities of Daily Living (Please select one for each item) Drive Automobile Not Able Take Medications Not Able Use Telephone Not Able Care for Appearance Not Able Use Toilet Need Assistance Bath / Shower Need Assistance Dress Self Need Assistance Feed Self Need Assistance Walk Not Able Get In / Out Bed Not Able Housework Not Able Prepare Meals Not Able Handle Money Not Able Shop for Self Not Able Electronic  Signature(s) Signed: 04/08/2016 4:11:15 PM By: Alric Quan Entered By: Alric Quan on 04/08/2016 09:12:13 [name] ([id number]) -------------------------------------------------------------------------------- Education Assessment Details Patient Name: [name] Date of Service: 04/08/2016 8:45 AM Medical Record Patient Account Number: [id number] Number: Treating RN: Ahmed Prima [birth year] (59 y.o. Other Clinician: Date of Birth/Sex: Male) Treating ROBSON, MICHAEL Primary Care Physician: [name] Physician/Extender: G Referring Physician: [place] in Treatment: 0 Primary Learner Assessed: Patient Learning Preferences/Education Level/Primary Language Learning Preference: Explanation, Printed Material Highest Education Level: High School Preferred Language: English Cognitive Barrier Assessment/Beliefs Language Barrier: No Translator Needed: No Memory Deficit: No Emotional Barrier: No Cultural/Religious Beliefs Affecting Medical No Care: Physical Barrier Assessment Impaired Vision: No Impaired Hearing: No Decreased Hand dexterity: No Knowledge/Comprehension Assessment Knowledge Level: High Comprehension Level: High Ability to understand written High instructions: Ability to understand verbal High instructions: Motivation Assessment Anxiety Level: Calm Cooperation: Cooperative Education Importance: Acknowledges Need Interest in Health Problems: Asks Questions Perception: Coherent Willingness to Engage in Self- High Management Activities: Readiness to Engage in Self- High Management Activities: BRY, CAMP[place] ([id number]) Electronic Signature(s) Signed: 04/08/2016 4:11:15 PM By: Alric Quan Entered By: Alric Quan on 04/08/2016 09:12:39 [name] ([id number]) -------------------------------------------------------------------------------- Fall Risk Assessment Details Patient Name: [name] Date of Service:  04/08/2016 8:45 AM Medical Record Patient Account Number: [id number] Number: Treating RN: Ahmed Prima 1956-03-31 (59 y.o. Other Clinician: Date of Birth/Sex: Male) Treating ROBSON, [place] Primary Care Physician: [name] Physician/Extender: G Referring Physician: Weeks in Treatment: 0 Fall Risk Assessment Items Have you had 2 or more falls in the last 12 monthso 0 Yes Have you had any fall that resulted in injury in the last 12 monthso 0 Yes FALL RISK ASSESSMENT: History of falling - immediate or within 3 months 0 No Secondary diagnosis 15 Yes Ambulatory aid None/bed rest/wheelchair/nurse 0 Yes Crutches/cane/walker 0 No Furniture 0 No IV Access/Saline Lock 0 No Gait/Training Normal/bed rest/immobile 0 No Weak  10 Yes Impaired 20 Yes Mental Status Oriented to own ability 0 Yes Electronic Signature(s) Signed: 04/08/2016 4:11:15 PM By: Alric Quan Entered By: Alric Quan on 04/08/2016 09:13:07 Saindon, Aamari ([id number]) -------------------------------------------------------------------------------- Foot Assessment Details Patient Name: [name] Date of Service: 04/08/2016 8:45 AM Medical Record Patient Account Number: [id number] Number: Treating RN: Ahmed Prima 01-15-1956 (59 y.o. Other Clinician: Date of Birth/Sex: Male) Treating ROBSON, MICHAEL Primary Care Physician: [name] Physician/Extender: G Referring Physician: Weeks in Treatment: 0 Foot Assessment Items Site Locations + = Sensation present, - = Sensation absent, C = Callus, U = Ulcer R = Redness, W = Warmth, M = Maceration, PU = Pre-ulcerative lesion F = Fissure, S = Swelling, D = Dryness Assessment Right: Left: Other Deformity: No No Prior Foot Ulcer: No No Prior Amputation: No No Charcot Joint: No No Ambulatory Status: Non-ambulatory Assistance Device: Wheelchair Gait: Administrator, arts) Signed: 04/08/2016 4:11:15 PM By: Alric Quan Entered By: Alric Quan on 04/08/2016 09:14:40 Leng, Konrad ([id number]) Nyoka Cowden, [place] ([id number]) -------------------------------------------------------------------------------- Nutrition Risk Assessment Details Patient Name: [name] Date of Service: 04/08/2016 8:45 AM Medical Record Patient Account Number: [id number] Number: Treating RN: Ahmed Prima Mar 18, 1956 (59 y.o. Other Clinician: Date of Birth/Sex: Male) Treating ROBSON, MICHAEL Primary Care Physician: [name] Physician/Extender: G Referring Physician: Weeks in Treatment: 0 Height (in): 70 Weight (lbs): Body Mass Index (BMI): Nutrition Risk Assessment Items NUTRITION RISK SCREEN: I have an illness or condition that made me change the kind and/or 2 Yes amount of food I eat I eat fewer than two meals per day 0 No I eat few fruits and vegetables, or milk products 0 No I have three or more drinks of beer, liquor or wine almost every day 0 No I have tooth or mouth problems that make it hard for me to eat 0 No I don't always have enough money to buy the food I need 0 No I eat alone most of the time 0 No I take three or more different prescribed or over-the-counter drugs a 0 No day Without wanting to, I have lost or gained 10 pounds in the last six 2 Yes months I am not always physically able to shop, cook and/or feed myself 2 Yes Nutrition Protocols Good Risk Protocol Moderate Risk Protocol Electronic Signature(s) Signed: 04/08/2016 4:11:15 PM By: Alric Quan Entered By: Alric Quan on 04/08/2016 09:13:31

## 2016-04-10 NOTE — Progress Notes (Signed)
KEVINS, JAFRI ([id number]) Visit Report for 04/08/2016 Chief Complaint Document Details Patient Name: [name], [name] Date of Service: 04/08/2016 8:45 AM Medical Record Patient Account Number: [id number] Number: Treating RN: Ahmed Prima 1956-02-19 (59 y.o. Other Clinician: Date of Birth/Sex: Male) Treating Ronit Marczak Primary Care Physician: [name] Physician/Extender: G Referring Physician: Weeks in Treatment: 0 Information Obtained from: Patient Chief Complaint The patient is here for wounds on his bilateral feet supposedly obtained during a hospitalization from 3/24 through 4/6 Electronic Signature(s) Signed: 04/09/2016 5:25:36 PM By: Linton Ham MD Entered By: Linton Ham on 04/08/2016 10:43:06 [name] ([id number]) -------------------------------------------------------------------------------- HPI Details Patient Name: [name] Date of Service: 04/08/2016 8:45 AM Medical Record Patient Account Number: [id number] Number: Treating RN: Ahmed Prima 1956/04/25 (59 y.o. Other Clinician: Date of Birth/Sex: Male) Treating Sinia Antosh Primary Care Physician: [name] Physician/Extender: G Referring Physician: Weeks in Treatment: 0 History of Present Illness HPI Description: 04/08/16; this is a patient we really don't know too much about. He was hospitalized from 3/24 through 4/6 residing with a sodium of 167 creatinine of 2.49 right lower lobe pneumonia sepsis/septic shock syndrome. He apparently came back to [facility] He apparently had been there only 2 weeks before he went out. I know very little about him premorbidly. The hospital he had a CT scan of the head that showed atrophy and chronic small vessel white matter ischemic changes and remote infarcts also noted within the bilateral basal ganglia. His echocardiogram and presentation showed a EF of 20-25%,  left ventricle was moderately dilated with diffuse hypokinesis. Apparently he return to his assisted living with wounds on his bilateral feet. According to the attendant came with him he could walk before he went out he could no longer walk now. There are wounds on both heels covered with a black eschar and also wounds on his bilateral dorsal feet. ABIs calculated in our clinic for 0.49 bilaterally. He was a smoker up to 2 months ago he is not smoking now. Apparently has a history of a CVA Electronic Signature(s) Signed: 04/09/2016 5:25:36 PM By: Linton Ham MD Entered By: Linton Ham on 04/08/2016 10:48:47 [name] ([id number]) -------------------------------------------------------------------------------- Physical Exam Details Patient Name: [name] Date of Service: 04/08/2016 8:45 AM Medical Record Patient Account Number: [id number] Number: Treating RN: Ahmed Prima 03/14/1956 (59 y.o. Other Clinician: Date of Birth/Sex: Male) Treating Evelyna Folker Primary Care Physician: [name] Physician/Extender: G Referring Physician: Weeks in Treatment: 0 Constitutional Sitting or standing Blood Pressure is within target range for patient.. Pulse regular and within target range for patient.Marland Kitchen Respirations regular, non-labored and within target range.. Temperature is normal and within the target range for the patient.. Patient's appearance is neat and clean. Appears in no acute distress. Well nourished and well developed.. Ears, Nose, Mouth, and Throat Mucous membranes are moist. Respiratory Respiratory effort is easy and symmetric bilaterally. Rate is normal at rest and on room air.. Bilateral breath sounds are clear and equal in all lobes with no wheezes, rales or rhonchi.. Cardiovascular Heart rhythm and rate regular, without murmur or gallop. He does not appear dehydrated. I had trouble feeling femoral or popliteal pulses. Pedal pulses absent  bilaterally.. Edema present in both extremities.. Gastrointestinal (GI) Abdomen is soft and non-distended without masses or tenderness. Bowel sounds active in all quadrants.. No liver or spleen enlargement or tenderness.. Lymphatic Palpable in the popliteal or inguinal areas. Neurological For a flexible sigmoidoscopy. Both knee jerks are  3+ bilaterally. Both plantar responses were flexor. Markedly increased tone with some degree of cogwheeling in the upper extremities. Leadpipe rigidity in the lower extremities. Notes Wound exam; patient has bilateral wounds over the Achilles area both calcanei. These are covered with a tightly adherent black eschar. On the top of his feet he has more healthy-looking superficial wounds bilaterally. Of note had a lot of trouble feeling any arterial pulses in his feet may be of benefit slight right popliteal pulse. His feet were cold to the touch Electronic Signature(s) Signed: 04/09/2016 5:25:36 PM By: Linton Ham MD Entered By: Linton Ham on 04/08/2016 11:17:44 [name] ([id number]) -------------------------------------------------------------------------------- Physician Orders Details Patient Name: [name] Date of Service: 04/08/2016 8:45 AM Medical Record Patient Account Number: [id number] Number: Treating RN: Ahmed Prima Jul 14, 1956 (59 y.o. Other Clinician: Date of Birth/Sex: Male) Treating Jeanette Rauth Primary Care Physician: [name] Physician/Extender: G Referring Physician: Suella [place] in Treatment: 0 Verbal / Phone Orders: Yes ClinicianCarolyne Fiscal, Debi Read Back and Verified: Yes Diagnosis Coding Wound Cleansing Wound #1 Left,Dorsal Foot o Clean wound with Normal Saline. Wound #2 Right,Dorsal Foot o Clean wound with Normal Saline. Wound #3 Left Calcaneus o Clean wound with Normal Saline. Wound #4 Right Calcaneus o Clean wound with Normal Saline. Anesthetic Wound #1 Left,Dorsal  Foot o Topical Lidocaine 4% cream applied to wound bed prior to debridement - office use only Wound #2 Right,Dorsal Foot o Topical Lidocaine 4% cream applied to wound bed prior to debridement - office use only Wound #3 Left Calcaneus o Topical Lidocaine 4% cream applied to wound bed prior to debridement - office use only Wound #4 Right Calcaneus o Topical Lidocaine 4% cream applied to wound bed prior to debridement - office use only Primary Wound Dressing Wound #1 Left,Dorsal Foot o Aquacel Ag Wound #2 Right,Dorsal Foot o Aquacel Ag Wound #3 Left Calcaneus o Santyl Ointment Neubert, Evans ([id number]) Wound #4 Right Calcaneus o Santyl Ointment Secondary Dressing Wound #1 Left,Dorsal Foot o ABD pad o Dry Gauze o Conform/Kerlix - tape, netting Wound #2 Right,Dorsal Foot o ABD pad o Dry Gauze o Conform/Kerlix - tape, netting Wound #3 Left Calcaneus o ABD pad o Dry Gauze o Conform/Kerlix - tape, netting Wound #4 Right Calcaneus o ABD pad o Dry Gauze o Conform/Kerlix - tape, netting Dressing Change Frequency Wound #1 Left,Dorsal Foot o Change dressing every other day. Wound #2 Right,Dorsal Foot o Change dressing every other day. Wound #3 Left Calcaneus o Change dressing every other day. Wound #4 Right Calcaneus o Change dressing every other day. Follow-up Appointments Wound #1 Left,Dorsal Foot o Return Appointment in 1 week. Wound #2 Right,Dorsal Foot o Return Appointment in 1 week. Wound #3 Left Calcaneus o Return Appointment in 1 week. [name], [name] ([id number]) Wound #4 Right Calcaneus o Return Appointment in 1 week. Off-Loading o Heel suspension boot to: - pt to wear sage boots, please float heels o Turn and reposition every 2 hours o Other: - Advertising account planner Orders / Instructions Wound #1 Left,Dorsal Foot o Increase protein intake. Wound #2 Right,Dorsal Foot o Increase protein  intake. Wound #3 Left Calcaneus o Increase protein intake. Wound #4 Right Calcaneus o Increase protein intake. Home Health Wound #1 [place] for [place] *****Please Order [place] for patient***** o Home Health Nurse may visit PRN to address patientos wound care needs. o FACE TO FACE ENCOUNTER: MEDICARE and MEDICAID PATIENTS: I certify that this patient is under my  care and that I had a face-to-face encounter that meets the physician face-to-face encounter requirements with this patient on this date. The encounter with the patient was in whole or in part for the following MEDICAL CONDITION: (primary reason for [place]) MEDICAL NECESSITY: I certify, that based on my findings, NURSING services are a medically necessary home health service. HOME BOUND STATUS: I certify that my clinical findings support that this patient is homebound (i.e., Due to illness or injury, pt requires aid of supportive devices such as crutches, cane, wheelchairs, walkers, the use of special transportation or the assistance of another person to leave their place of residence. There is a normal inability to leave the home and doing so requires considerable and taxing effort. Other absences are for medical reasons / religious services and are infrequent or of short duration when for other reasons). o If current dressing causes regression in wound condition, may D/C ordered dressing product/s and apply Normal Saline Moist Dressing daily until next [place] / Other MD appointment. [place] of regression in wound condition at [phone]. o Please direct any NON-WOUND related issues/requests for orders to patient's Primary Care Physician Wound #2 [place] for [place] *****Please Order [place] for patient***** o Home Health Nurse may visit  PRN to address patientos wound care needs. MALEKAI, CARLE ([id number]) o FACE TO FACE ENCOUNTER: MEDICARE and MEDICAID PATIENTS: I certify that this patient is under my care and that I had a face-to-face encounter that meets the physician face-to-face encounter requirements with this patient on this date. The encounter with the patient was in whole or in part for the following MEDICAL CONDITION: (primary reason for [place]) MEDICAL NECESSITY: I certify, that based on my findings, NURSING services are a medically necessary home health service. HOME BOUND STATUS: I certify that my clinical findings support that this patient is homebound (i.e., Due to illness or injury, pt requires aid of supportive devices such as crutches, cane, wheelchairs, walkers, the use of special transportation or the assistance of another person to leave their place of residence. There is a normal inability to leave the home and doing so requires considerable and taxing effort. Other absences are for medical reasons / religious services and are infrequent or of short duration when for other reasons). o If current dressing causes regression in wound condition, may D/C ordered dressing product/s and apply Normal Saline Moist Dressing daily until next [place] / Other MD appointment. [place] of regression in wound condition at [phone]. o Please direct any NON-WOUND related issues/requests for orders to patient's Primary Care Physician Wound #3 Left [place] for [place] *****Please Order [place] for patient***** o Home Health Nurse may visit PRN to address patientos wound care needs. o FACE TO FACE ENCOUNTER: MEDICARE and MEDICAID PATIENTS: I certify that this patient is under my care and that I had a face-to-face encounter that meets the physician face-to-face encounter requirements with this patient on this  date. The encounter with the patient was in whole or in part for the following MEDICAL CONDITION: (primary reason for [place]) MEDICAL NECESSITY: I certify, that based on my findings, NURSING services are a medically necessary home health service. HOME BOUND STATUS: I certify that my clinical findings support that this patient is homebound (i.e., Due to illness or injury, pt requires aid of supportive devices such as  crutches, cane, wheelchairs, walkers, the use of special transportation or the assistance of another person to leave their place of residence. There is a normal inability to leave the home and doing so requires considerable and taxing effort. Other absences are for medical reasons / religious services and are infrequent or of short duration when for other reasons). o If current dressing causes regression in wound condition, may D/C ordered dressing product/s and apply Normal Saline Moist Dressing daily until next [place] / Other MD appointment. [place] of regression in wound condition at [phone]. o Please direct any NON-WOUND related issues/requests for orders to patient's Primary Care Physician Wound #4 Right [place] for [place] *****Please Order [place] for patient***** o Home Health Nurse may visit PRN to address patientos wound care needs. o FACE TO FACE ENCOUNTER: MEDICARE and MEDICAID PATIENTS: I certify that this patient is under my care and that I had a face-to-face encounter that meets the physician face-to-face encounter requirements with this patient on this date. The encounter with the patient was in whole or in part for the following MEDICAL CONDITION: (primary reason for [place]) MEDICAL NECESSITY: I certify, that based on my findings, NURSING services are a medically necessary home health service. HOME BOUND STATUS: I certify that my clinical  findings [name], [name] ([id number]) support that this patient is homebound (i.e., Due to illness or injury, pt requires aid of supportive devices such as crutches, cane, wheelchairs, walkers, the use of special transportation or the assistance of another person to leave their place of residence. There is a normal inability to leave the home and doing so requires considerable and taxing effort. Other absences are for medical reasons / religious services and are infrequent or of short duration when for other reasons). o If current dressing causes regression in wound condition, may D/C ordered dressing product/s and apply Normal Saline Moist Dressing daily until next [place] / Other MD appointment. [place] of regression in wound condition at [phone]. o Please direct any NON-WOUND related issues/requests for orders to patient's Primary Care Physician Medications-please add to medication list. Wound #1 Left,Dorsal Foot o Santyl Enzymatic Ointment o Other: - Vitamin C, Zinc, Multivitamin Wound #2 Right,Dorsal Foot o Santyl Enzymatic Ointment o Other: - Vitamin C, Zinc, Multivitamin Wound #3 Left Calcaneus o Santyl Enzymatic Ointment o Other: - Vitamin C, Zinc, Multivitamin Wound #4 Right Calcaneus o Santyl Enzymatic Ointment o Other: - Vitamin C, Zinc, Multivitamin Services and Therapies o Arterial Studies- Bilateral - [facility] o Venous Studies -Bilateral - [facility] Custom Services o [employer] fast streamer - for home health to order Electronic Signature(s) Signed: 04/08/2016 4:11:15 PM By: Alric Quan Signed: 04/09/2016 5:25:36 PM By: Linton Ham MD Entered By: Alric Quan on 04/08/2016 10:24:56 [name] ([id number]) -------------------------------------------------------------------------------- Problem List Details Patient Name: [name] Date of Service: 04/08/2016 8:45 AM Medical Record Patient Account Number:  [id number] Number: Treating RN: Ahmed Prima Apr 05, 1956 (59 y.o. 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 Other Clinician: Date of Birth/Sex: Male) Treating Dama Hedgepeth Primary Care Physician: [name] Physician/Extender: G Referring Physician: Weeks in Treatment: 0 Subjective Chief Complaint Information obtained from Patient The patient is here for wounds on his bilateral feet supposedly obtained during a hospitalization from 3/24 through 4/6 History of Present Illness (HPI) 04/08/16; this is a patient we really don't know too much about. He was hospitalized from 3/24 through 4/6 residing with a sodium of 167 creatinine of 2.49 right lower lobe pneumonia sepsis/septic shock syndrome. He apparently came back to [facility] He apparently had been there only 2 weeks before he went out. I know  very little about him premorbidly. The hospital he had a CT scan of the head that showed atrophy and chronic small vessel white matter ischemic changes and remote infarcts also noted within the bilateral basal ganglia. His echocardiogram and presentation showed a EF of 20-25%, left ventricle was moderately dilated with diffuse hypokinesis. Apparently he return to his assisted living with wounds on his bilateral feet. According to the attendant came with him he could walk before he went out he could no longer walk now. There are wounds on both heels covered with a black eschar and also wounds on his bilateral dorsal feet. ABIs calculated in our clinic for 0.49 bilaterally. He was a smoker up to 2 months ago he is not smoking now. Apparently has a history of a CVA Wound History Patient presents with 4 open wounds that have been present for approximately 2 months. Patient has been treating wounds in the following manner: wrapping in guaze. Laboratory tests have not been performed in the last month. Patient reportedly has not tested positive for an antibiotic resistant organism. Patient reportedly has not tested positive for osteomyelitis. Patient reportedly has not had testing performed to evaluate circulation in the legs. Patient experiences the following problems associated with their wounds: swelling. Patient History Allergies NKDA Social History Former smoker - smoked up to 2 months ago, Marital Status - Married, Alcohol Use - Never, Drug Use - No Mood, [name] ([id number]) History, Caffeine Use - Never. Medical History Hematologic/Lymphatic Patient has history of Anemia Medical And Surgical History Notes Respiratory PNA, collapse lung, mucus plug in bronchi, acute respiratory failure, thrombocytopenia Review of Systems (ROS) Constitutional Symptoms (General Health) The patient has no complaints or symptoms. Eyes The patient has no complaints or symptoms. Cardiovascular hx of  strokes Gastrointestinal The patient has no complaints or symptoms. Endocrine hypernatremia metabolic encephalopathy Genitourinary ARF Immunological SEPSIS septic shock Integumentary (Skin) Complains or has symptoms of Wounds, pressure ulcer Musculoskeletal Complains or has symptoms of Muscle Weakness, lactic acidosis Neurologic right side weakness Oncologic The patient has no complaints or symptoms. Psychiatric Complains or has symptoms of Anxiety. Objective Constitutional Sitting or standing Blood Pressure is within target range for patient.. Pulse regular and within target range for patient.Marland Kitchen Respirations regular, non-labored and within target range.. Temperature is normal and within the target range for the patient.. Patient's appearance is neat and clean. Appears in no acute distress. Well nourished and well developed.. Vitals Time Taken: 9:00 AM, Height: 70 in, Source: Stated, Pulse: 62 bpm, Respiratory  Rate: 16 Hughes, Larry ([id number]) breaths/min, Blood Pressure: 143/77 mmHg. Ears, Nose, Mouth, and Throat Mucous membranes are moist. Respiratory Respiratory effort is easy and symmetric bilaterally. Rate is normal at rest and on room air.. Bilateral breath sounds are clear and equal in all lobes with no wheezes, rales or rhonchi.. Cardiovascular Heart rhythm and rate regular, without murmur or gallop. He does not appear dehydrated. I had trouble feeling femoral or popliteal pulses. Pedal pulses absent bilaterally.. Edema present in both extremities.. Gastrointestinal (GI) Abdomen is soft and non-distended without masses or tenderness. Bowel sounds active in all quadrants.. No liver or spleen enlargement or tenderness.. Lymphatic Palpable in the popliteal or inguinal areas. Neurological For a flexible sigmoidoscopy. Both knee jerks are 3+ bilaterally. Both plantar responses were flexor. Markedly increased tone with some degree of cogwheeling in the upper extremities.  Leadpipe rigidity in the lower extremities. General Notes: Wound exam; patient has bilateral wounds over the Achilles area both calcanei. These are covered with a tightly adherent black eschar. On the top of his feet he has more healthy-looking superficial wounds bilaterally. Of note had a lot of trouble feeling any arterial pulses in his feet may be of benefit slight right popliteal pulse. His feet were cold to the touch Integumentary (Hair, Skin) Wound #1 status is Open. Original cause of wound was Pressure Injury. The wound is located on the Left,Dorsal Foot. The wound measures 2cm length x 0.8cm width x 0.1cm depth; 1.257cm^2 area and 0.126cm^3 volume. The wound is limited to skin breakdown. There is no tunneling or undermining noted. There is a large amount of serous drainage noted. The wound margin is flat and intact. There is no granulation within the wound bed. There is a large (67-100%) amount of necrotic tissue within the wound bed including Eschar and Adherent Slough. The periwound skin appearance exhibited: Localized Edema, Moist. Periwound temperature was noted as No Abnormality. The periwound has tenderness on palpation. Wound #2 status is Open. Original cause of wound was Pressure Injury. The wound is located on the Right,Dorsal Foot. The wound measures 0.2cm length x 0.1cm width x 0.1cm depth; 0.016cm^2 area and 0.002cm^3 volume. The wound is limited to skin breakdown. There is no tunneling or undermining noted. There is a large amount of serous drainage noted. The wound margin is flat and intact. There is no granulation within the wound bed. There is a large (67-100%) amount of necrotic tissue within the wound bed including Eschar. The periwound skin appearance exhibited: Localized Edema, Moist. Periwound temperature was noted as No Abnormality. The periwound has tenderness on palpation. Wound #3 status is Open. Original cause of wound was Pressure Injury. The wound is located  on the Left Calcaneus. The wound measures 3.2cm length x 8.5cm width x 0.1cm depth; 21.363cm^2 area and [name], [name] ([id number]) 2.136cm^3 volume. The wound is limited to skin breakdown. There is no tunneling or undermining noted. The wound margin is flat and intact. There is no granulation within the wound bed. There is a large (67- 100%) amount of necrotic tissue within the wound bed including Eschar. The periwound skin appearance exhibited: Dry/Scaly. Periwound temperature was noted as No Abnormality. The periwound has tenderness on palpation. Wound #4 status is Open. Original cause of wound was Pressure Injury. The wound is located on the Right Calcaneus. The wound measures 3.2cm length x 5cm width x 0.1cm depth; 12.566cm^2 area and 1.257cm^3 volume. The wound is limited to skin breakdown. There is no tunneling or undermining noted. The wound margin is  flat and intact. There is no granulation within the wound bed. There is a large (67- 100%) amount of necrotic tissue within the wound bed including Eschar. The periwound skin appearance exhibited: Dry/Scaly. Periwound temperature was noted as No Abnormality. The periwound has tenderness on palpation. 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 Wound #2 Right,Dorsal Foot: Clean wound with Normal Saline. Wound #3 Left Calcaneus: Clean wound with Normal Saline. Wound #4 Right Calcaneus: Clean wound with Normal Saline. Anesthetic: Wound #1 Left,Dorsal Foot: Topical Lidocaine 4% cream applied to wound bed prior to debridement - office use only Wound #2 Right,Dorsal  Foot: Topical Lidocaine 4% cream applied to wound bed prior to debridement - office use only Wound #3 Left Calcaneus: Topical Lidocaine 4% cream applied to wound bed prior to debridement - office use only [name], [name] ([id number]) Wound #4 Right Calcaneus: Topical Lidocaine 4% cream applied to wound bed prior to debridement - office use only Primary Wound Dressing: Wound #1 Left,Dorsal Foot: Aquacel Ag Wound #2 Right,Dorsal Foot: Aquacel Ag Wound #3 Left Calcaneus: Santyl Ointment Wound #4 Right Calcaneus: Santyl Ointment Secondary Dressing: Wound #1 Left,Dorsal Foot: ABD pad Dry Gauze Conform/Kerlix - tape, netting Wound #2 Right,Dorsal Foot: ABD pad Dry Gauze Conform/Kerlix - tape, netting Wound #3 Left Calcaneus: ABD pad Dry Gauze Conform/Kerlix - tape, netting Wound #4 Right Calcaneus: ABD pad Dry Gauze Conform/Kerlix - tape, netting Dressing Change Frequency: Wound #1 Left,Dorsal Foot: Change dressing every other day. Wound #2 Right,Dorsal Foot: Change dressing every other day. Wound #3 Left Calcaneus: Change dressing every other day. Wound #4 Right Calcaneus: Change dressing every other day. Follow-up Appointments: Wound #1 Left,Dorsal Foot: Return Appointment in 1 week. Wound #2 Right,Dorsal Foot: Return Appointment in 1 week. Wound #3 Left Calcaneus: Return Appointment in 1 week. Wound #4 Right Calcaneus: Return Appointment in 1 week. Off-Loading: Heel suspension boot to: - pt to wear sage boots, please float heels Turn and reposition every 2 hours Other: - Hoyer Lift Additional Orders / Instructions: [place], [name] ([id number]) Wound #1 Left,Dorsal Foot: Increase protein intake. Wound #2 Right,Dorsal Foot: Increase protein intake. Wound #3 Left Calcaneus: Increase protein intake. Wound #4 Right Calcaneus: Increase protein intake. Home Health: Wound #1 Left,Dorsal Foot: [place] for [place] *****Please  Order [place] for patient***** Home Health Nurse may visit PRN to address patient s wound care needs. FACE TO FACE ENCOUNTER: MEDICARE and MEDICAID PATIENTS: I certify that this patient is under my care and that I had a face-to-face encounter that meets the physician face-to-face encounter requirements with this patient on this date. The encounter with the patient was in whole or in part for the following MEDICAL CONDITION: (primary reason for [place]) MEDICAL NECESSITY: I certify, that based on my findings, NURSING services are a medically necessary home health service. HOME BOUND STATUS: I certify that my clinical findings support that this patient is homebound (i.e., Due to illness or injury, pt requires aid of supportive devices such as crutches, cane, wheelchairs, walkers, the use of special transportation or the assistance of another person to leave their place of residence. There is a normal inability to leave the  home and doing so requires considerable and taxing effort. Other absences are for medical reasons / religious services and are infrequent or of short duration when for other reasons). If current dressing causes regression in wound condition, may D/C ordered dressing product/s and apply Normal Saline Moist Dressing daily until next [place] / Other MD appointment. [place] of regression in wound condition at [phone]. Please direct any NON-WOUND related issues/requests for orders to patient's Primary Care Physician Wound #2 Right,Dorsal Foot: [place] for [place] *****Please Order [place] for patient***** Home Health Nurse may visit PRN to address patient s wound care needs. FACE TO FACE ENCOUNTER: MEDICARE and MEDICAID PATIENTS: I certify that this patient is under my care and that I had a face-to-face encounter that meets the physician face-to-face encounter requirements with this  patient on this date. The encounter with the patient was in whole or in part for the following MEDICAL CONDITION: (primary reason for [place]) MEDICAL NECESSITY: I certify, that based on my findings, NURSING services are a medically necessary home health service. HOME BOUND STATUS: I certify that my clinical findings support that this patient is homebound (i.e., Due to illness or injury, pt requires aid of supportive devices such as crutches, cane, wheelchairs, walkers, the use of special transportation or the assistance of another person to leave their place of residence. There is a normal inability to leave the home and doing so requires considerable and taxing effort. Other absences are for medical reasons / religious services and are infrequent or of short duration when for other reasons). If current dressing causes regression in wound condition, may D/C ordered dressing product/s and apply Normal Saline Moist Dressing daily until next [place] / Other MD appointment. [place] of regression in wound condition at [phone]. Please direct any NON-WOUND related issues/requests for orders to patient's Primary Care Physician Wound #3 Left Calcaneus: [place] for [place] *****Please Order [place] for patient***** Home Health Nurse may visit PRN to address patient s wound care needs. FACE TO FACE ENCOUNTER: MEDICARE and MEDICAID PATIENTS: I certify that this patient is under my care and that I had a face-to-face encounter that meets the physician face-to-face encounter [name], [name] ([id number]) requirements with this patient on this date. The encounter with the patient was in whole or in part for the following MEDICAL CONDITION: (primary reason for [place]) MEDICAL NECESSITY: I certify, that based on my findings, NURSING services are a medically necessary home health service. HOME BOUND STATUS: I certify  that my clinical findings support that this patient is homebound (i.e., Due to illness or injury, pt requires aid of supportive devices such as crutches, cane, wheelchairs, walkers, the use of special transportation or the assistance of another person to leave their place of residence. There is a normal inability to leave the home and doing so requires considerable and taxing effort. Other absences are for medical reasons / religious services and are infrequent or of short duration when for other reasons). If current dressing causes regression in wound condition, may D/C ordered dressing product/s and apply Normal Saline Moist Dressing daily until next [place] / Other MD appointment. [place] of regression in wound condition at [phone]. Please direct any NON-WOUND related issues/requests for orders to patient's Primary Care Physician Wound #4 Right Calcaneus: [place] for [place] *****Please  Order [place] for patient***** Home Health Nurse may visit PRN to address patient s wound care needs. FACE TO FACE ENCOUNTER: MEDICARE and MEDICAID PATIENTS: I certify that this patient is under my care and that I had a face-to-face encounter that meets the physician face-to-face encounter requirements with this patient on this date. The encounter with the patient was in whole or in part for the following MEDICAL CONDITION: (primary reason for [place]) MEDICAL NECESSITY: I certify, that based on my findings, NURSING services are a medically necessary home health service. HOME BOUND STATUS: I certify that my clinical findings support that this patient is homebound (i.e., Due to illness or injury, pt requires aid of supportive devices such as crutches, cane, wheelchairs, walkers, the use of special transportation or the assistance of another person to leave their place of residence. There is a normal inability to leave the  home and doing so requires considerable and taxing effort. Other absences are for medical reasons / religious services and are infrequent or of short duration when for other reasons). If current dressing causes regression in wound condition, may D/C ordered dressing product/s and apply Normal Saline Moist Dressing daily until next [place] / Other MD appointment. [place] of regression in wound condition at [phone]. Please direct any NON-WOUND related issues/requests for orders to patient's Primary Care Physician Medications-please add to medication list.: Wound #1 Left,Dorsal Foot: Santyl Enzymatic Ointment Other: - Vitamin C, Zinc, Multivitamin Wound #2 Right,Dorsal Foot: Santyl Enzymatic Ointment Other: - Vitamin C, Zinc, Multivitamin Wound #3 Left Calcaneus: Santyl Enzymatic Ointment Other: - Vitamin C, Zinc, Multivitamin Wound #4 Right Calcaneus: Santyl Enzymatic Ointment Other: - Vitamin C, Zinc, Multivitamin Services and Therapies ordered were: Arterial Studies- Bilateral - AVVS, Venous Studies -Bilateral - AVVS ordered were: Har[place] Lemon Lift - for home health to order [name], [name] ([id number]) #1 we dressed the wounds with Santyl to the heels and collagen to the wounds on the top of his feet. I am assuming etiology of these wounds was predominantly pressure on the heels, but also seems quite likely he has very significant PAD. #2 we are going to have formal noninvasive arterial studies ordered. I am not going to do anything about the eschar on his heels until I have a better idea about the vascular supply. #3 echocardiogram in the hospital showed a severe cardiomyopathy, the reason behind this is not completely clear. #4 parkinsonism. 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 In my previous practice I would've given him a challenge of Sinemet for 2 months. I might actually call his primary physician and discuss this Electronic Signature(s) Signed: 04/09/2016 5:25:36 PM By: Linton Ham MD Entered By: Linton Ham on 04/08/2016 11:28:28 [name] ([id number]) -------------------------------------------------------------------------------- ROS/PFSH Details Patient Name: [name] Date of Service: 04/08/2016 8:45 AM Medical Record Patient Account Number: [id number] Number: Treating RN: Ahmed Prima [birth year] (59 y.o. Other Clinician: Date of Birth/Sex: Male) Treating Rosaleigh Brazzel, [place] Primary Care Physician: [name] Physician/Extender: G Referring Physician: Weeks in Treatment: 0 Wound History Do you currently have one or more open woundso Yes How many open wounds do you currently haveo 4 Approximately how long have you had your woundso 2 months How have you been treating your wound(s) until nowo wrapping in guaze Has your wound(s) ever healed and then re-openedo No Have you had any lab  work done in the past montho No Have you tested positive for an antibiotic resistant organism (MRSA, VRE)o No Have you tested positive for osteomyelitis (bone infection)o No Have you had any tests for circulation on your legso No Have you had other problems associated with your woundso Swelling Integumentary (Skin) Complaints and Symptoms: Positive for: Wounds Review of System Notes: pressure ulcer Musculoskeletal Complaints and Symptoms: Positive for: Muscle Weakness Review of System Notes: lactic acidosis Psychiatric Complaints and Symptoms: Positive for: Anxiety Constitutional Symptoms (General Health) Complaints and Symptoms: No Complaints or Symptoms Eyes Complaints and Symptoms: No Complaints or Symptoms [name], [name] ([id number]) Hematologic/Lymphatic Medical History: Positive for: Anemia Respiratory Medical  History: Past Medical History Notes: PNA, collapse lung, mucus plug in bronchi, acute respiratory failure, thrombocytopenia Cardiovascular Complaints and Symptoms: Review of System Notes: hx of strokes Gastrointestinal Complaints and Symptoms: No Complaints or Symptoms Endocrine Complaints and Symptoms: Review of System Notes: hypernatremia metabolic encephalopathy Genitourinary Complaints and Symptoms: Review of System Notes: ARF Immunological Complaints and Symptoms: Review of System Notes: SEPSIS septic shock Neurologic Complaints and Symptoms: Review of System Notes: right side weakness Oncologic Complaints and Symptoms: No Complaints or Symptoms [name], [name] ([id number]) Family and Social History Former smoker - smoked up to 2 months ago; Marital Status - Married; Alcohol Use: Never; Drug Use: No History; Caffeine Use: Never; Financial Concerns: No; Food, Clothing or Shelter Needs: No; Support System Lacking: No; Advanced Directives: No; Patient does not want information on Advanced Directives; Do not resuscitate: No; Living Will: No; Medical Power of Attorney: No Electronic Signature(s) Signed: 04/08/2016 4:11:15 PM By: Alric Quan Signed: 04/09/2016 5:25:36 PM By: Linton Ham MD Entered By: Alric Quan on 04/08/2016 09:11:26 [name] ([id number]) -------------------------------------------------------------------------------- SuperBill Details Patient Name: [name] Date of Service: 04/08/2016 Medical Record Patient Account Number: [id number] Number: Treating RN: Ahmed Prima [birth year] (59 y.o. 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

## 2016-04-10 NOTE — Progress Notes (Signed)
[name] ([id number]) Visit Report for 04/08/2016 Allergy List Details Patient Name: [name], [name] Date of Service: 04/08/2016 8:45 AM Medical Record Number: [id number] Patient Account Number: [id number] Date of Birth/Sex: Jul 15, 1956 (60 y.o. Male) Treating RN: Ahmed Prima Primary Care Physician: [name] Other Clinician: Referring Physician: Treating Physician/Extender: [name] in Treatment: 0 Allergies Active Allergies NKDA Allergy Notes Electronic Signature(s) Signed: 04/08/2016 4:11:15 PM By: Alric Quan Entered By: Alric Quan on 04/08/2016 09:02:30 [name] ([id number]) -------------------------------------------------------------------------------- Arrival Information Details Patient Name: [name] Date of Service: 04/08/2016 8:45 AM Medical Record Number: [id number] Patient Account Number: [id number] Date of Birth/Sex: [birth year] (60 y.o. Male) Treating RN: Ahmed Prima Primary Care Physician: [name] Other Clinician: Referring Physician: Treating Physician/Extender: [name] in Treatment: 0 Visit Information Patient Arrived: Wheel Chair Arrival Time: 08:56 Accompanied By: caregiver Transfer Assistance: Other Patient Identification Verified: Yes Secondary Verification Process Yes Completed: Patient Requires Transmission-Based No Precautions: Patient Has Alerts: No Electronic Signature(s) Signed: 04/08/2016 4:11:15 PM By: Alric Quan Entered By: Alric Quan on 04/08/2016 08:59:01 [name] ([id number]) -------------------------------------------------------------------------------- Clinic Level of Care Assessment Details Patient Name: [name] Date of Service: 04/08/2016 8:45 AM Medical Record Number: [id number] Patient Account Number: [id number] Date of Birth/Sex: [birth year] (60 y.o. Male) Treating RN: Ahmed Prima Primary Care Physician: [name] Other  Clinician: Referring Physician: Treating Physician/Extender: [name] in Treatment: 0 Clinic Level of Care Assessment Items TOOL 2 Quantity Score X - Use when only an EandM is performed on the INITIAL visit 1 0 ASSESSMENTS - Nursing Assessment / Reassessment X - General Physical Exam (combine w/ comprehensive assessment (listed just 1 20 below) when performed on new pt. evals) X - Comprehensive Assessment (HX, ROS, Risk Assessments, Wounds Hx, etc.) 1 25 ASSESSMENTS - Wound and Skin Assessment / Reassessment []  - Simple Wound Assessment / Reassessment - one wound 0 X - Complex Wound Assessment / Reassessment - multiple wounds 4 5 []  - Dermatologic / Skin Assessment (not related to wound area) 0 ASSESSMENTS - Ostomy and/or Continence Assessment and Care []  - Incontinence Assessment and Management 0 []  - Ostomy Care Assessment and Management (repouching, etc.) 0 PROCESS - Coordination of Care []  - Simple Patient / Family Education for ongoing care 0 X - Complex (extensive) Patient / Family Education for ongoing care 1 20 X - Staff obtains Programmer, systems, Records, Test Results / Process Orders 1 10 X - Staff telephones HHA, Nursing Homes / Clarify orders / etc 1 10 []  - Routine Transfer to another Facility (non-emergent condition) 0 []  - Routine Hospital Admission (non-emergent condition) 0 X - New Admissions / Biomedical engineer / Ordering NPWT, Apligraf, etc. 1 15 []  - Emergency Hospital Admission (emergent condition) 0 X - Simple Discharge Coordination 1 10 [name] ([id number]) []  - Complex (extensive) Discharge Coordination 0 PROCESS - Special Needs []  - Pediatric / Minor Patient Management 0 []  - Isolation Patient Management 0 []  - Hearing / Language / Visual special needs 0 []  - Assessment of Community assistance (transportation, D/C planning, etc.) 0 []  - Additional assistance / Altered mentation 0 []  - Support Surface(s) Assessment (bed, cushion, seat,  etc.) 0 INTERVENTIONS - Wound Cleansing / Measurement X - Wound Imaging (photographs - any number of wounds) 1 5 []  - Wound Tracing (instead of photographs) 0 []  - Simple Wound Measurement - one wound 0 X - Complex Wound Measurement - multiple wounds 4 5 []  - Simple Wound Cleansing - one wound 0 X - Complex  Wound Cleansing - multiple wounds 4 5 INTERVENTIONS - Wound Dressings []  - Small Wound Dressing one or multiple wounds 0 X - Medium Wound Dressing one or multiple wounds 4 15 []  - Large Wound Dressing one or multiple wounds 0 []  - Application of Medications - injection 0 INTERVENTIONS - Miscellaneous []  - External ear exam 0 []  - Specimen Collection (cultures, biopsies, blood, body fluids, etc.) 0 []  - Specimen(s) / Culture(s) sent or taken to Lab for analysis 0 []  - Patient Transfer (multiple staff / Harrel Lemon Lift / Similar devices) 0 []  - Simple Staple / Suture removal (25 or less) 0 []  - Complex Staple / Suture removal (26 or more) 0 [name] ([id number]) []  - Hypo / Hyperglycemic Management (close monitor of Blood Glucose) 0 X - Ankle / Brachial Index (ABI) - do not check if billed separately 1 15 Has the patient been seen at the hospital within the last three years: Yes Total Score: 250 Level Of Care: New/Established - Level 5 Electronic Signature(s) Signed: 04/08/2016 4:11:15 PM By: Alric Quan Entered By: Alric Quan on 04/08/2016 13:26:03 [name] ([id number]) -------------------------------------------------------------------------------- Encounter Discharge Information Details Patient Name: [name] Date of Service: 04/08/2016 8:45 AM Medical Record Number: [id number] Patient Account Number: [id number] Date of Birth/Sex: 1956/04/21 (61 y.o. Male) Treating RN: Ahmed Prima Primary Care Physician: [name] Other Clinician: Referring Physician: Treating Physician/Extender: [name] in Treatment: 0 Encounter Discharge  Information Items Discharge Pain Level: 0 Discharge Condition: Stable Ambulatory Status: Wheelchair Discharge Destination: Nursing Home Transportation: Other Accompanied By: caregiver Schedule Follow-up Appointment: Yes Medication Reconciliation completed and provided to Patient/Care No Annagrace Carr: Provided on Clinical Summary of Care: 04/08/2016 Form Type Recipient Paper Patient DG Electronic Signature(s) Signed: 04/08/2016 10:29:52 AM By: [name] Entered By: [name] on 04/08/2016 10:29:52 Baumler, [name] ([id number]) -------------------------------------------------------------------------------- Lower Extremity Assessment Details Patient Name: [name] Date of Service: 04/08/2016 8:45 AM Medical Record Number: [id number] Patient Account Number: [id number] Date of Birth/Sex: 10-25-1956 (60 y.o. Male) Treating RN: Carolyne Fiscal, Debi Primary Care Physician: [name] Other Clinician: Referring Physician: Treating Physician/Extender: [name] in Treatment: 0 Edema Assessment Assessed: [Left: No] [Right: No] E[Left: dema] [Right: :] Calf Left: Right: Point of Measurement: 33 cm From Medial Instep 27 cm 29 cm Ankle Left: Right: Point of Measurement: 10 cm From Medial Instep 21.5 cm 22 cm Vascular Assessment Pulses: Posterior Tibial Dorsalis Pedis Palpable: [Left:No] [Right:No] Doppler: [Left:Monophasic] [Right:Monophasic] Extremity colors, hair growth, and conditions: Extremity Color: [Left:Normal] [Right:Normal] Hair Growth on Extremity: [Left:Yes] [Right:Yes] Temperature of Extremity: [Left:Cool] [Right:Cool] Capillary Refill: [Left:> 3 seconds] [Right:> 3 seconds] Blood Pressure: Brachial: [Left:143] [Right:143] Dorsalis Pedis: 70 [Left:Dorsalis Pedis: 70] Ankle: Posterior Tibial: 60 [Left:Posterior Tibial: 60 0.49] [Right:0.49] Toe Nail Assessment Left: Right: Thick: Yes Yes Discolored: Yes Yes Deformed: No No Improper Length  and Hygiene: No No Electronic Signature(s) KATZER, Clarke ([id number]) Signed: 04/08/2016 4:11:15 PM By: Alric Quan Entered By: Alric Quan on 04/08/2016 09:23:55 Macnair, Rontrell ([id number]) -------------------------------------------------------------------------------- Multi Wound Chart Details Patient Name: [name] Date of Service: 04/08/2016 8:45 AM Medical Record Number: [id number] Patient Account Number: [id number] Date of Birth/Sex: 06-09-1956 (61 y.o. Male) Treating RN: Ahmed Prima Primary Care Physician: [name] Other Clinician: Referring Physician: Treating Physician/Extender: [name] in Treatment: 0 Vital Signs Height(in): 70 Pulse(bpm): 62 Weight(lbs): Blood Pressure 143/77 (mmHg): Body Mass Index(BMI): Temperature(F): Respiratory Rate 16 (breaths/min): Photos: [1:No Photos] [2:No Photos] [3:No Photos] Wound Location: [1:Left Foot - Dorsal] [2:Right Foot - Dorsal] [3:Left  Calcaneus] Wounding Event: [1:Pressure Injury] [2:Pressure Injury] [3:Pressure Injury] Primary Etiology: [1:Pressure Ulcer] [2:Pressure Ulcer] [3:Pressure Ulcer] Comorbid History: [1:Anemia] [2:Anemia] [3:Anemia] Date Acquired: [1:02/07/2016] [2:02/07/2016] [3:02/07/2016] Weeks of Treatment: [1:0] [2:0] [3:0] Wound Status: [1:Open] [2:Open] [3:Open] Measurements L x W x D 2x0.8x0.1 [2:0.2x0.1x0.1] [3:3.2x8.5x0.1] (cm) Area (cm) : [1:1.257] [2:0.016] [3:21.363] Volume (cm) : [1:0.126] [2:0.002] [3:2.136] Classification: [1:Category/Stage II] [2:Category/Stage II] [3:Category/Stage II] Exudate Amount: [1:Large] [2:Large] [3:N/A] Exudate Type: [1:Serous] [2:Serous] [3:N/A] Exudate Color: [1:amber] [2:amber] [3:N/A] Wound Margin: [1:Flat and Intact] [2:Flat and Intact] [3:Flat and Intact] Granulation Amount: [1:None Present (0%)] [2:None Present (0%)] [3:None Present (0%)] Necrotic Amount: [1:Large (67-100%)] [2:Large (67-100%)] [3:Large  (67-100%)] Necrotic Tissue: [1:Eschar, Adherent Slough] [2:Eschar] [3:Eschar] Exposed Structures: [1:Fascia: No Fat: No Tendon: No Muscle: No Joint: No Bone: No Limited to Skin Breakdown] [2:Fascia: No Fat: No Tendon: No Muscle: No Joint: No Bone: No Limited to Skin Breakdown] [3:Fascia: No Fat: No Tendon: No Muscle: No Joint: No Bone: No Limited to  Skin Breakdown] Epithelialization: [1:None] [2:None] [3:None] Periwound Skin Texture: Edema: Yes [2:Edema: Yes] [3:No Abnormalities Noted] Periwound Skin Moist: Yes Moist: Yes Dry/Scaly: Yes Moisture: Periwound Skin Color: No Abnormalities Noted No Abnormalities Noted No Abnormalities Noted Temperature: No Abnormality No Abnormality No Abnormality Tenderness on Yes Yes Yes Palpation: Wound Preparation: Ulcer Cleansing: Ulcer Cleansing: Ulcer Cleansing: Rinsed/Irrigated with Rinsed/Irrigated with Rinsed/Irrigated with Saline Saline Saline Topical Anesthetic Topical Anesthetic Topical Anesthetic Applied: Other: lidocaine Applied: Other: lidocaine Applied: Other: lidocaine 4% 4% 4% Wound Number: 4 N/A N/A Photos: No Photos N/A N/A Wound Location: Right Calcaneus N/A N/A Wounding Event: Pressure Injury N/A N/A Primary Etiology: Pressure Ulcer N/A N/A Comorbid History: Anemia N/A N/A Date Acquired: 02/07/2016 N/A N/A Weeks of Treatment: 0 N/A N/A Wound Status: Open N/A N/A Measurements L x W x D 3.2x5x0.1 N/A N/A (cm) Area (cm) : 12.566 N/A N/A Volume (cm) : 1.257 N/A N/A Classification: Category/Stage II N/A N/A Exudate Amount: N/A N/A N/A Exudate Type: N/A N/A N/A Exudate Color: N/A N/A N/A Wound Margin: Flat and Intact N/A N/A Granulation Amount: None Present (0%) N/A N/A Necrotic Amount: Large (67-100%) N/A N/A Necrotic Tissue: Eschar N/A N/A Exposed Structures: Fascia: No N/A N/A Fat: No Tendon: No Muscle: No Joint: No Bone: No Limited to Skin Breakdown Epithelialization: None N/A N/A Periwound Skin Texture: No  Abnormalities Noted N/A N/A Periwound Skin Dry/Scaly: Yes N/A N/A Moisture: Periwound Skin Color: No Abnormalities Noted N/A N/A Temperature: No Abnormality N/A N/A Lantis, Valerian ([id number]) Tenderness on Yes N/A N/A Palpation: Wound Preparation: Ulcer Cleansing: N/A N/A Rinsed/Irrigated with Saline Topical Anesthetic Applied: Other: lidocaine 4% Treatment Notes Electronic Signature(s) Signed: 04/08/2016 4:11:15 PM By: Alric Quan Entered By: Alric Quan on 04/08/2016 09:41:37 Oquinn, Pellegrino ([id number]) -------------------------------------------------------------------------------- Multi-Disciplinary Care Plan Details Patient Name: [name] Date of Service: 04/08/2016 8:45 AM Medical Record Number: [id number] Patient Account Number: [id number] Date of Birth/Sex: 03-05-1956 (60 y.o. Male) Treating RN: Ahmed Prima Primary Care Physician: [name] Other Clinician: Referring Physician: Treating Physician/Extender: [name] in Treatment: 0 Active Inactive Abuse / Safety / Falls / Self Care Management Nursing Diagnoses: Potential for falls Goals: Patient will remain injury free Date Initiated: 04/08/2016 Goal Status: Active Interventions: Assess fall risk on admission and as needed Notes: Nutrition Nursing Diagnoses: Imbalanced nutrition Goals: Patient/caregiver agrees to and verbalizes understanding of need to use nutritional supplements and/or vitamins as prescribed Date Initiated: 04/08/2016 Goal Status: Active Interventions: Assess patient nutrition upon admission and as needed per policy Notes: Orientation to  the Wound Care Program Nursing Diagnoses: Knowledge deficit related to the wound healing center program Goals: Patient/caregiver will verbalize understanding of the Wound Healing Center Program MUKESH, WUNSCHEL ([id number]) Date Initiated: 04/08/2016 Goal Status: Active Interventions: Provide education on orientation to  the wound center Notes: Pain, Acute or Chronic Nursing Diagnoses: Pain, acute or chronic: actual or potential Potential alteration in comfort, pain Goals: Patient will verbalize adequate pain control and receive pain control interventions during procedures as needed Date Initiated: 04/08/2016 Goal Status: Active Interventions: Assess comfort goal upon admission Complete pain assessment as per visit requirements Notes: Pressure Nursing Diagnoses: Knowledge deficit related to causes and risk factors for pressure ulcer development Knowledge deficit related to management of pressures ulcers Goals: Patient will remain free from development of additional pressure ulcers Date Initiated: 04/08/2016 Goal Status: Active Interventions: Assess offloading mechanisms upon admission and as needed Assess potential for pressure ulcer upon admission and as needed Notes: Wound/Skin Impairment Nursing Diagnoses: Impaired tissue integrity Dang, Brach ([id number]) Goals: Ulcer/skin breakdown will have a volume reduction of 30% by week 4 Date Initiated: 04/08/2016 Goal Status: Active Ulcer/skin breakdown will have a volume reduction of 50% by week 8 Date Initiated: 04/08/2016 Goal Status: Active Ulcer/skin breakdown will have a volume reduction of 80% by week 12 Date Initiated: 04/08/2016 Goal Status: Active Interventions: Assess ulceration(s) every visit Notes: Electronic Signature(s) Signed: 04/08/2016 4:11:15 PM By: Alejandro Mulling Entered By: Alejandro Mulling on 04/08/2016 09:41:24 Bjorn, Amarian ([id number]) -------------------------------------------------------------------------------- Pain Assessment Details Patient Name: Sheran Fava Date of Service: 04/08/2016 8:45 AM Medical Record Number: [id number] Patient Account Number: [id number] Date of Birth/Sex: May 26, 1956 (60 y.o. Male) Treating RN: Phillis Haggis Primary Care Physician: Franco Nones Other Clinician: Referring  Physician: Treating Physician/Extender: Maxwell Caul Weeks in Treatment: 0 Active Problems Location of Pain Severity and Description of Pain Patient Has Paino Yes Site Locations Pain Location: Pain in Ulcers With Dressing Change: Yes Duration of the Pain. Constant / Intermittento Constant Rate the pain. Current Pain Level: 8 Character of Pain Describe the Pain: Aching, Burning, Throbbing Pain Management and Medication Current Pain Management: Electronic Signature(s) Signed: 04/08/2016 4:11:15 PM By: Alejandro Mulling Entered By: Alejandro Mulling on 04/08/2016 09:00:03 Sheran Fava ([id number]) -------------------------------------------------------------------------------- Patient/Caregiver Education Details Patient Name: Sheran Fava Date of Service: 04/08/2016 8:45 AM Medical Record Number: [id number] Patient Account Number: [id number] Date of Birth/Gender: [birth year] (60 y.o. Male) Treating RN: Phillis Haggis Primary Care Physician: Franco Nones Other Clinician: Referring Physician: Treating Physician/Extender: Altamese [place] in Treatment: 0 Education Assessment Education Provided To: Patient and Caregiver Education Topics Provided Wound/Skin Impairment: Handouts: Other: change dressing as ordered Methods: Demonstration, Explain/Verbal Responses: State content correctly Electronic Signature(s) Signed: 04/08/2016 4:11:15 PM By: Alejandro Mulling Entered By: Alejandro Mulling on 04/08/2016 10:26:59 Statler, Filemon ([id number]) -------------------------------------------------------------------------------- Wound Assessment Details Patient Name: Sheran Fava Date of Service: 04/08/2016 8:45 AM Medical Record Number: [id number] Patient Account Number: [id number] Date of Birth/Sex: May 24, 1956 (60 y.o. Male) Treating RN: Ashok Cordia, Debi Primary Care Physician: Franco Nones Other Clinician: Referring Physician: Treating Physician/Extender: Maxwell Caul Weeks in Treatment: 0 Wound Status Wound Number: 1 Primary Etiology: Pressure Ulcer Wound Location: Left Foot - Dorsal Wound Status: Open Wounding Event: Pressure Injury Comorbid History: Anemia Date Acquired: 02/07/2016 Weeks Of Treatment: 0 Clustered Wound: No Photos Photo Uploaded By: Alejandro Mulling on 04/08/2016 15:36:44 Wound Measurements Length: (cm) 2 Width: (cm) 0.8 Depth: (cm) 0.1 Area: (cm) 1.257 Volume: (cm) 0.126 % Reduction in Area: % Reduction in Volume: Epithelialization: None Tunneling: No Undermining: No  Wound Description Classification: Category/Stage II Wound Margin: Flat and Intact Exudate Amount: Large Exudate Type: Serous Exudate Color: amber Foul Odor After Cleansing: No Wound Bed Granulation Amount: None Present (0%) Exposed Structure Necrotic Amount: Large (67-100%) Fascia Exposed: No Necrotic Quality: Eschar, Adherent Slough Fat Layer Exposed: No Tendon Exposed: No Jaime, Seith ([id number]) Muscle Exposed: No Joint Exposed: No Bone Exposed: No Limited to Skin Breakdown Periwound Skin Texture Texture Color No Abnormalities Noted: No No Abnormalities Noted: No Localized Edema: Yes Temperature / Pain Moisture Temperature: No Abnormality No Abnormalities Noted: No Tenderness on Palpation: Yes Moist: Yes Wound Preparation Ulcer Cleansing: Rinsed/Irrigated with Saline Topical Anesthetic Applied: Other: lidocaine 4%, Treatment Notes Wound #1 (Left, Dorsal Foot) 1. Cleansed with: Clean wound with Normal Saline 2. Anesthetic Topical Lidocaine 4% cream to wound bed prior to debridement 4. Dressing Applied: Aquacel Ag 5. Secondary Dressing Applied ABD Pad Dry Gauze Kerlix/Conform 7. Secured with Tape Notes netting Electronic Signature(s) Signed: 04/08/2016 4:11:15 PM By: Alric Quan Entered By: Alric Quan on 04/08/2016 09:28:19 Bekker, Okechukwu  ([id number]) -------------------------------------------------------------------------------- Wound Assessment Details Patient Name: [name] Date of Service: 04/08/2016 8:45 AM Medical Record Number: [id number] Patient Account Number: [id number] Date of Birth/Sex: 11/05/1956 (60 y.o. Male) Treating RN: Ahmed Prima Primary Care Physician: [name] Other Clinician: Referring Physician: Treating Physician/Extender: [name] in Treatment: 0 Wound Status Wound Number: 2 Primary Etiology: Pressure Ulcer Wound Location: Right Foot - Dorsal Wound Status: Open Wounding Event: Pressure Injury Comorbid History: Anemia Date Acquired: 02/07/2016 Weeks Of Treatment: 0 Clustered Wound: No Photos Photo Uploaded By: Alric Quan on 04/08/2016 15:37:02 Wound Measurements Length: (cm) 0.2 Width: (cm) 0.1 Depth: (cm) 0.1 Area: (cm) 0.016 Volume: (cm) 0.002 % Reduction in Area: % Reduction in Volume: Epithelialization: None Tunneling: No Undermining: No Wound Description Classification: Category/Stage II Wound Margin: Flat and Intact Exudate Amount: Large Exudate Type: Serous Exudate Color: amber Foul Odor After Cleansing: No Wound Bed Granulation Amount: None Present (0%) Exposed Structure Necrotic Amount: Large (67-100%) Fascia Exposed: No Necrotic Quality: Eschar Fat Layer Exposed: No Tendon Exposed: No Sarrazin, Roddy ([id number]) Muscle Exposed: No Joint Exposed: No Bone Exposed: No Limited to Skin Breakdown Periwound Skin Texture Texture Color No Abnormalities Noted: No No Abnormalities Noted: No Localized Edema: Yes Temperature / Pain Moisture Temperature: No Abnormality No Abnormalities Noted: No Tenderness on Palpation: Yes Moist: Yes Wound Preparation Ulcer Cleansing: Rinsed/Irrigated with Saline Topical Anesthetic Applied: Other: lidocaine 4%, Treatment Notes Wound #2 (Right, Dorsal Foot) 1. Cleansed with: Clean wound with  Normal Saline 2. Anesthetic Topical Lidocaine 4% cream to wound bed prior to debridement 4. Dressing Applied: Aquacel Ag 5. Secondary Dressing Applied ABD Pad Dry Gauze Kerlix/Conform 7. Secured with Tape Notes netting Electronic Signature(s) Signed: 04/08/2016 4:11:15 PM By: Alric Quan Entered By: Alric Quan on 04/08/2016 09:31:38 Kalas, Rathana ([id number]) -------------------------------------------------------------------------------- Wound Assessment Details Patient Name: [name] Date of Service: 04/08/2016 8:45 AM Medical Record Number: [id number] Patient Account Number: [id number] Date of Birth/Sex: [birth year] (60 y.o. Male) Treating RN: Ahmed Prima Primary Care Physician: [name] Other Clinician: Referring Physician: Treating Physician/Extender: [name] in Treatment: 0 Wound Status Wound Number: 3 Primary Etiology: Pressure Ulcer Wound Location: Left Calcaneus Wound Status: Open Wounding Event: Pressure Injury Comorbid History: Anemia Date Acquired: 02/07/2016 Weeks Of Treatment: 0 Clustered Wound: No Photos Photo Uploaded By: Alric Quan on 04/08/2016 15:37:02 Wound Measurements Length: (cm) 3.2 Width: (cm) 8.5 Depth: (cm) 0.1 Area: (cm) 21.363 Volume: (cm) 2.136 % Reduction in Area: %  Reduction in Volume: Epithelialization: None Tunneling: No Undermining: No Wound Description Classification: Category/Stage II Wound Margin: Flat and Intact Foul Odor After Cleansing: No Wound Bed Granulation Amount: None Present (0%) Exposed Structure Necrotic Amount: Large (67-100%) Fascia Exposed: No Necrotic Quality: Eschar Fat Layer Exposed: No Tendon Exposed: No Muscle Exposed: No Joint Exposed: No Bone Exposed: No Golphin, Truth ([id number]) Limited to Skin Breakdown Periwound Skin Texture Texture Color No Abnormalities Noted: No No Abnormalities Noted: No Moisture Temperature / Pain No Abnormalities  Noted: No Temperature: No Abnormality Dry / Scaly: Yes Tenderness on Palpation: Yes Wound Preparation Ulcer Cleansing: Rinsed/Irrigated with Saline Topical Anesthetic Applied: Other: lidocaine 4%, Treatment Notes Wound #3 (Left Calcaneus) 1. Cleansed with: Clean wound with Normal Saline 2. Anesthetic Topical Lidocaine 4% cream to wound bed prior to debridement 4. Dressing Applied: Santyl Ointment 5. Secondary Dressing Applied ABD Pad Dry Gauze Kerlix/Conform 7. Secured with Tape Notes netting Electronic Signature(s) Signed: 04/08/2016 4:11:15 PM By: Alric Quan Entered By: Alric Quan on 04/08/2016 09:33:58 Auxier, Izaiyah ([id number]) -------------------------------------------------------------------------------- Wound Assessment Details Patient Name: [name] Date of Service: 04/08/2016 8:45 AM Medical Record Number: [id number] Patient Account Number: [id number] Date of Birth/Sex: 02-04-1956 (60 y.o. Male) Treating RN: Ahmed Prima Primary Care Physician: [name] Other Clinician: Referring Physician: Treating Physician/Extender: [name] in Treatment: 0 Wound Status Wound Number: 4 Primary Etiology: Pressure Ulcer Wound Location: Right Calcaneus Wound Status: Open Wounding Event: Pressure Injury Comorbid History: Anemia Date Acquired: 02/07/2016 Weeks Of Treatment: 0 Clustered Wound: No Photos Photo Uploaded By: Alric Quan on 04/08/2016 15:37:36 Wound Measurements Length: (cm) 3.2 Width: (cm) 5 Depth: (cm) 0.1 Area: (cm) 12.566 Volume: (cm) 1.257 % Reduction in Area: % Reduction in Volume: Epithelialization: None Tunneling: No Undermining: No Wound Description Classification: Category/Stage II Wound Margin: Flat and Intact Foul Odor After Cleansing: No Wound Bed Granulation Amount: None Present (0%) Exposed Structure Necrotic Amount: Large (67-100%) Fascia Exposed: No Necrotic Quality: Eschar Fat  Layer Exposed: No Tendon Exposed: No Muscle Exposed: No Joint Exposed: No Bone Exposed: No Stecher, Dajaun ([id number]) Limited to Skin Breakdown Periwound Skin Texture Texture Color No Abnormalities Noted: No No Abnormalities Noted: No Moisture Temperature / Pain No Abnormalities Noted: No Temperature: No Abnormality Dry / Scaly: Yes Tenderness on Palpation: Yes Wound Preparation Ulcer Cleansing: Rinsed/Irrigated with Saline Topical Anesthetic Applied: Other: lidocaine 4%, Treatment Notes Wound #4 (Right Calcaneus) 1. Cleansed with: Clean wound with Normal Saline 2. Anesthetic Topical Lidocaine 4% cream to wound bed prior to debridement 4. Dressing Applied: Santyl Ointment 5. Secondary Dressing Applied ABD Pad Dry Gauze Kerlix/Conform 7. Secured with Tape Notes netting Electronic Signature(s) Signed: 04/08/2016 4:11:15 PM By: Alric Quan Entered By: Alric Quan on 04/08/2016 09:35:39 Frisk, Loys ([id number]) -------------------------------------------------------------------------------- Vitals Details Patient Name: [name] Date of Service: 04/08/2016 8:45 AM Medical Record Number: [id number] Patient Account Number: [id number] Date of Birth/Sex: 30-May-1956 (60 y.o. Male) Treating RN: Ahmed Prima Primary Care Physician: [name] Other Clinician: Referring Physician: Treating Physician/Extender: [name] in Treatment: 0 Vital Signs Time Taken: 09:00 Pulse (bpm): 62 Height (in): 70 Respiratory Rate (breaths/min): 16 Source: Stated Blood Pressure (mmHg): 143/77 Reference Range: 80 - 120 mg / dl Electronic Signature(s) Signed: 04/08/2016 4:11:15 PM By: Alric Quan Entered By: Alric Quan on 04/08/2016 09:00:47

## 2016-04-16 ENCOUNTER — Encounter: Payer: Medicare Other | Attending: Internal Medicine | Admitting: Internal Medicine

## 2016-04-16 DIAGNOSIS — Z8673 Personal history of transient ischemic attack (TIA), and cerebral infarction without residual deficits: Secondary | ICD-10-CM | POA: Diagnosis not present

## 2016-04-16 DIAGNOSIS — I70245 Atherosclerosis of native arteries of left leg with ulceration of other part of foot: Secondary | ICD-10-CM | POA: Insufficient documentation

## 2016-04-16 DIAGNOSIS — I70234 Atherosclerosis of native arteries of right leg with ulceration of heel and midfoot: Secondary | ICD-10-CM | POA: Diagnosis not present

## 2016-04-16 DIAGNOSIS — L89610 Pressure ulcer of right heel, unstageable: Secondary | ICD-10-CM | POA: Insufficient documentation

## 2016-04-16 DIAGNOSIS — Z87891 Personal history of nicotine dependence: Secondary | ICD-10-CM | POA: Diagnosis not present

## 2016-04-16 DIAGNOSIS — L89620 Pressure ulcer of left heel, unstageable: Secondary | ICD-10-CM | POA: Diagnosis not present

## 2016-04-17 NOTE — Progress Notes (Signed)
COLLINS, VANDENBOS ([id number]) Visit Report for 04/16/2016 Arrival Information Details Patient Name: [name], [name] Date of Service: 04/16/2016 8:45 AM Medical Record Number: [id number] Patient Account Number: [id number] Date of Birth/Sex: 1956/05/30 (61 y.o. Male) Treating RN: Ahmed Prima Primary Care Physician: [name] Other Clinician: Referring Physician: [name] Treating Physician/Extender: [name] in Treatment: 1 Visit Information History Since Last Visit All ordered tests and consults were completed: No Patient Arrived: Wheel Chair Added or deleted any medications: No Arrival Time: 08:57 Any new allergies or adverse reactions: No Accompanied By: caregiver Had a fall or experienced change in No activities of daily living that may affect Transfer Assistance: Other risk of falls: Patient Requires Transmission-Based No Signs or symptoms of abuse/neglect since last No Precautions: visito Patient Has Alerts: No Hospitalized since last visit: No Pain Present Now: No Electronic Signature(s) Signed: 04/16/2016 5:55:43 PM By: Alric Quan Entered By: Alric Quan on 04/16/2016 08:58:21 Riegler, [name] ([id number]) -------------------------------------------------------------------------------- Clinic Level of Care Assessment Details Patient Name: [name] Date of Service: 04/16/2016 8:45 AM Medical Record Number: [id number] Patient Account Number: [id number] Date of Birth/Sex: Jun 02, 1956 (60 y.o. Male) Treating RN: Carolyne Fiscal, Debi Primary Care Physician: [name] Other Clinician: Referring Physician: [name] Treating Physician/Extender: [name] in Treatment: 1 Clinic Level of Care Assessment Items TOOL 4 Quantity Score X - Use when only an EandM is performed on FOLLOW-UP visit 1 0 ASSESSMENTS - Nursing Assessment / Reassessment X - Reassessment of Co-morbidities (includes updates in patient status) 1 10 X -  Reassessment of Adherence to Treatment Plan 1 5 ASSESSMENTS - Wound and Skin Assessment / Reassessment []  - Simple Wound Assessment / Reassessment - one wound 0 X - Complex Wound Assessment / Reassessment - multiple wounds 4 5 []  - Dermatologic / Skin Assessment (not related to wound area) 0 ASSESSMENTS - Focused Assessment []  - Circumferential Edema Measurements - multi extremities 0 []  - Nutritional Assessment / Counseling / Intervention 0 []  - Lower Extremity Assessment (monofilament, tuning fork, pulses) 0 []  - Peripheral Arterial Disease Assessment (using hand held doppler) 0 ASSESSMENTS - Ostomy and/or Continence Assessment and Care []  - Incontinence Assessment and Management 0 []  - Ostomy Care Assessment and Management (repouching, etc.) 0 PROCESS - Coordination of Care []  - Simple Patient / Family Education for ongoing care 0 X - Complex (extensive) Patient / Family Education for ongoing care 1 20 X - Staff obtains Programmer, systems, Records, Test Results / Process Orders 1 10 X - Staff telephones HHA, Nursing Homes / Clarify orders / etc 1 10 []  - Routine Transfer to another Facility (non-emergent condition) 0 Pennywell, Stokely ([id number]) []  - Routine Hospital Admission (non-emergent condition) 0 []  - New Admissions / Biomedical engineer / Ordering NPWT, Apligraf, etc. 0 []  - Emergency Hospital Admission (emergent condition) 0 X - Simple Discharge Coordination 1 10 []  - Complex (extensive) Discharge Coordination 0 PROCESS - Special Needs []  - Pediatric / Minor Patient Management 0 []  - Isolation Patient Management 0 []  - Hearing / Language / Visual special needs 0 []  - Assessment of Community assistance (transportation, D/C planning, etc.) 0 []  - Additional assistance / Altered mentation 0 []  - Support Surface(s) Assessment (bed, cushion, seat, etc.) 0 INTERVENTIONS - Wound Cleansing / Measurement []  - Simple Wound Cleansing - one wound 0 X - Complex Wound Cleansing - multiple  wounds 4 5 X - Wound Imaging (photographs - any number of wounds) 1 5 []  - Wound Tracing (instead of photographs) 0 []  -  Simple Wound Measurement - one wound 0 X - Complex Wound Measurement - multiple wounds 4 5 INTERVENTIONS - Wound Dressings X - Small Wound Dressing one or multiple wounds 4 10 []  - Medium Wound Dressing one or multiple wounds 0 []  - Large Wound Dressing one or multiple wounds 0 X - Application of Medications - topical 1 5 []  - Application of Medications - injection 0 INTERVENTIONS - Miscellaneous []  - External ear exam 0 Lada, Henning ([id number]) []  - Specimen Collection (cultures, biopsies, blood, body fluids, etc.) 0 []  - Specimen(s) / Culture(s) sent or taken to Lab for analysis 0 X - Patient Transfer (multiple staff / Civil Service fast streamer / Similar devices) 1 10 []  - Simple Staple / Suture removal (25 or less) 0 []  - Complex Staple / Suture removal (26 or more) 0 []  - Hypo / Hyperglycemic Management (close monitor of Blood Glucose) 0 []  - Ankle / Brachial Index (ABI) - do not check if billed separately 0 X - Vital Signs 1 5 Has the patient been seen at the hospital within the last three years: Yes Total Score: 190 Level Of Care: New/Established - Level 5 Electronic Signature(s) Signed: 04/16/2016 5:55:43 PM By: Alric Quan Entered By: Alric Quan on 04/16/2016 16:56:35 Cullinane, [name] ([id number]) -------------------------------------------------------------------------------- Encounter Discharge Information Details Patient Name: [name] Date of Service: 04/16/2016 8:45 AM Medical Record Number: [id number] Patient Account Number: [id number] Date of Birth/Sex: 06/02/1956 (61 y.o. Male) Treating RN: Ahmed Prima Primary Care Physician: [name] Other Clinician: Referring Physician: [name] Treating Physician/Extender: [name] in Treatment: 1 Encounter Discharge Information Items Discharge Pain Level: 0 Discharge Condition:  Stable Ambulatory Status: Wheelchair Discharge Destination: Nursing Home Transportation: Other Accompanied By: caregiver Schedule Follow-up Appointment: Yes Medication Reconciliation completed Yes and provided to Patient/Care Nicholaos Schippers: Provided on Clinical Summary of Care: 04/16/2016 Form Type Recipient Paper Patient DG Electronic Signature(s) Signed: 04/16/2016 9:51:59 AM By: [name] Entered By: [name] on 04/16/2016 09:51:59 Montag, Yunior ([id number]) -------------------------------------------------------------------------------- Lower Extremity Assessment Details Patient Name: [name] Date of Service: 04/16/2016 8:45 AM Medical Record Number: [id number] Patient Account Number: [id number] Date of Birth/Sex: 1955-11-17 (60 y.o. Male) Treating RN: Ahmed Prima Primary Care Physician: [name] Other Clinician: Referring Physician: [name] Treating Physician/Extender: [name] in Treatment: 1 Vascular Assessment Pulses: Posterior Tibial Dorsalis Pedis Palpable: [Left:No] [Right:No] Doppler: [Left:Monophasic] [Right:Monophasic] Extremity colors, hair growth, and conditions: Temperature of Extremity: [Left:Cool] [Right:Cool] Capillary Refill: [Left:> 3 seconds] [Right:> 3 seconds] Toe Nail Assessment Left: Right: Thick: Yes Yes Discolored: Yes Yes Deformed: No No Improper Length and Hygiene: No No Electronic Signature(s) Signed: 04/16/2016 5:55:43 PM By: Alric Quan Entered By: Alric Quan on 04/16/2016 09:01:26 Habeeb, [name] ([id number]) -------------------------------------------------------------------------------- Multi Wound Chart Details Patient Name: [name] Date of Service: 04/16/2016 8:45 AM Medical Record Number: [id number] Patient Account Number: [id number] Date of Birth/Sex: 1956/04/07 (60 y.o. Male) Treating RN: Carolyne Fiscal, Debi Primary Care Physician: [name] Other Clinician: Referring  Physician: [name] Treating Physician/Extender: [name] in Treatment: 1 Vital Signs Height(in): 70 Pulse(bpm): 67 Weight(lbs): Blood Pressure 116/77 (mmHg): Body Mass Index(BMI): Temperature(F): Respiratory Rate 16 (breaths/min): Photos: [1:No Photos] [2:No Photos] [3:No Photos] Wound Location: [1:Left Foot - Dorsal] [2:Right Foot - Dorsal] [3:Left Calcaneus] Wounding Event: [1:Pressure Injury] [2:Pressure Injury] [3:Pressure Injury] Primary Etiology: [1:Pressure Ulcer] [2:Pressure Ulcer] [3:Pressure Ulcer] Comorbid History: [1:Anemia] [2:Anemia] [3:Anemia] Date Acquired: [1:02/07/2016] [2:02/07/2016] [3:02/07/2016] Weeks of Treatment: [1:1] [2:1] [3:1] Wound Status: [1:Open] [2:Open] [3:Open] Measurements L x W x  D 1.5x0.4x0.1 [2:0.2x0.1x0.1] [3:3.2x8.5x0.1] (cm) Area (cm) : [1:0.471] [2:0.016] [3:21.363] Volume (cm) : [1:0.047] [2:0.002] [3:2.136] % Reduction in Area: [1:62.50%] [2:0.00%] [3:0.00%] % Reduction in Volume: 62.70% [2:0.00%] [3:0.00%] Classification: [1:Category/Stage II] [2:Category/Stage II] [3:Category/Stage II] Exudate Amount: [1:None Present] [2:None Present] [3:None Present] Wound Margin: [1:Flat and Intact] [2:Flat and Intact] [3:Flat and Intact] Granulation Amount: [1:None Present (0%)] [2:None Present (0%)] [3:None Present (0%)] Necrotic Amount: [1:Large (67-100%)] [2:Large (67-100%)] [3:Large (67-100%)] Necrotic Tissue: [1:Eschar] [2:Eschar] [3:Eschar] Exposed Structures: [1:Fascia: No Fat: No Tendon: No Muscle: No Joint: No Bone: No Limited to Skin Breakdown] [2:Fascia: No Fat: No Tendon: No Muscle: No Joint: No Bone: No Limited to Skin Breakdown] [3:Fascia: No Fat: No Tendon: No Muscle: No Joint: No Bone: No Limited to  Skin Breakdown] Epithelialization: [1:None] [2:None] [3:None] Periwound Skin Texture: Edema: Yes [2:Edema: Yes] [3:No Abnormalities Noted] Periwound Skin Moist: Yes Moist: Yes Dry/Scaly:  Yes Moisture: Periwound Skin Color: No Abnormalities Noted No Abnormalities Noted No Abnormalities Noted Temperature: No Abnormality No Abnormality No Abnormality Tenderness on Yes Yes Yes Palpation: Wound Preparation: Ulcer Cleansing: Ulcer Cleansing: Ulcer Cleansing: Rinsed/Irrigated with Rinsed/Irrigated with Rinsed/Irrigated with Saline Saline Saline Topical Anesthetic Topical Anesthetic Topical Anesthetic Applied: Other: lidocaine Applied: Other: lidocaine Applied: Other: lidocaine 4% 4% 4% Wound Number: 4 N/A N/A Photos: No Photos N/A N/A Wound Location: Right Calcaneus N/A N/A Wounding Event: Pressure Injury N/A N/A Primary Etiology: Pressure Ulcer N/A N/A Comorbid History: Anemia N/A N/A Date Acquired: 02/07/2016 N/A N/A Weeks of Treatment: 1 N/A N/A Wound Status: Open N/A N/A Measurements L x W x D 3.2x5x0.1 N/A N/A (cm) Area (cm) : 12.566 N/A N/A Volume (cm) : 1.257 N/A N/A % Reduction in Area: 0.00% N/A N/A % Reduction in Volume: 0.00% N/A N/A Classification: Category/Stage II N/A N/A Exudate Amount: None Present N/A N/A Wound Margin: Flat and Intact N/A N/A Granulation Amount: None Present (0%) N/A N/A Necrotic Amount: Large (67-100%) N/A N/A Necrotic Tissue: Eschar N/A N/A Exposed Structures: Fascia: No N/A N/A Fat: No Tendon: No Muscle: No Joint: No Bone: No Limited to Skin Breakdown Epithelialization: None N/A N/A Periwound Skin Texture: No Abnormalities Noted N/A N/A Periwound Skin Dry/Scaly: Yes N/A N/A Moisture: Periwound Skin Color: No Abnormalities Noted N/A N/A Temperature: No Abnormality N/A N/A Reddoch, Beryl ([id number]) Tenderness on Yes N/A N/A Palpation: Wound Preparation: Ulcer Cleansing: N/A N/A Rinsed/Irrigated with Saline Topical Anesthetic Applied: Other: lidocaine 4% Treatment Notes Electronic Signature(s) Signed: 04/16/2016 5:55:43 PM By: Alric Quan Entered By: Alric Quan on 04/16/2016 09:12:12 [name]  ([id number]) -------------------------------------------------------------------------------- Multi-Disciplinary Care Plan Details Patient Name: [name] Date of Service: 04/16/2016 8:45 AM Medical Record Number: [id number] Patient Account Number: [id number] Date of Birth/Sex: 09/09/1956 (60 y.o. Male) Treating RN: Ahmed Prima Primary Care Physician: [name] Other Clinician: Referring Physician: [name] Treating Physician/Extender: [name] in Treatment: 1 Active Inactive Abuse / Safety / Falls / Self Care Management Nursing Diagnoses: Potential for falls Goals: Patient will remain injury free Date Initiated: 04/08/2016 Goal Status: Active Interventions: Assess fall risk on admission and as needed Notes: Nutrition Nursing Diagnoses: Imbalanced nutrition Goals: Patient/caregiver agrees to and verbalizes understanding of need to use nutritional supplements and/or vitamins as prescribed Date Initiated: 04/08/2016 Goal Status: Active Interventions: Assess patient nutrition upon admission and as needed per policy Notes: Orientation to the Wound Care Program Nursing Diagnoses: Knowledge deficit related to the wound healing center program Goals: Patient/caregiver will verbalize understanding of the [place] ([id number]) Date Initiated:  04/08/2016 Goal Status: Active Interventions: Provide education on orientation to the wound center Notes: Pain, Acute or Chronic Nursing Diagnoses: Pain, acute or chronic: actual or potential Potential alteration in comfort, pain Goals: Patient will verbalize adequate pain control and receive pain control interventions during procedures as needed Date Initiated: 04/08/2016 Goal Status: Active Interventions: Assess comfort goal upon admission Complete pain assessment as per visit requirements Notes: Pressure Nursing Diagnoses: Knowledge deficit related to causes and  risk factors for pressure ulcer development Knowledge deficit related to management of pressures ulcers Goals: Patient will remain free from development of additional pressure ulcers Date Initiated: 04/08/2016 Goal Status: Active Interventions: Assess offloading mechanisms upon admission and as needed Assess potential for pressure ulcer upon admission and as needed Notes: Wound/Skin Impairment Nursing Diagnoses: Impaired tissue integrity Ericksen, Harshil ([id number]) Goals: Ulcer/skin breakdown will have a volume reduction of 30% by week 4 Date Initiated: 04/08/2016 Goal Status: Active Ulcer/skin breakdown will have a volume reduction of 50% by week 8 Date Initiated: 04/08/2016 Goal Status: Active Ulcer/skin breakdown will have a volume reduction of 80% by week 12 Date Initiated: 04/08/2016 Goal Status: Active Interventions: Assess ulceration(s) every visit Notes: Electronic Signature(s) Signed: 04/16/2016 5:55:43 PM By: Alric Quan Entered By: Alric Quan on 04/16/2016 09:12:04 [place], [name] ([id number]) -------------------------------------------------------------------------------- Pain Assessment Details Patient Name: [name] Date of Service: 04/16/2016 8:45 AM Medical Record Number: [id number] Patient Account Number: [id number] Date of Birth/Sex: February 15, 1956 (60 y.o. Male) Treating RN: Ahmed Prima Primary Care Physician: [name] Other Clinician: Referring Physician: [name] Treating Physician/Extender: [name] in Treatment: 1 Active Problems Location of Pain Severity and Description of Pain Patient Has Paino No Site Locations Pain Management and Medication Current Pain Management: Electronic Signature(s) Signed: 04/16/2016 5:55:43 PM By: Alric Quan Entered By: Alric Quan on 04/16/2016 08:58:28 Deyarmin, [name]  ([id number]) -------------------------------------------------------------------------------- Patient/Caregiver Education Details Patient Name: [name] Date of Service: 04/16/2016 8:45 AM Medical Record Number: [id number] Patient Account Number: [id number] Date of Birth/Gender: 06-Nov-1956 (60 y.o. Male) Treating RN: Ahmed Prima Primary Care Physician: [name] Other Clinician: Referring Physician: [name] Treating Physician/Extender: [name] in Treatment: 1 Education Assessment Education Provided To: Patient Education Topics Provided Wound/Skin Impairment: Handouts: Other: chnage dressing as ordered Methods: Demonstration, Explain/Verbal Responses: State content correctly Electronic Signature(s) Signed: 04/16/2016 5:55:43 PM By: Alric Quan Entered By: Alric Quan on 04/16/2016 09:35:45 Daisey, Lakoda ([id number]) -------------------------------------------------------------------------------- Wound Assessment Details Patient Name: [name] Date of Service: 04/16/2016 8:45 AM Medical Record Number: [id number] Patient Account Number: [id number] Date of Birth/Sex: April 16, 1956 (60 y.o. Male) Treating RN: Carolyne Fiscal, Debi Primary Care Physician: [name] Other Clinician: Referring Physician: [name] Treating Physician/Extender: [name] in Treatment: 1 Wound Status Wound Number: 1 Primary Etiology: Pressure Ulcer Wound Location: Left Foot - Dorsal Wound Status: Open Wounding Event: Pressure Injury Comorbid History: Anemia Date Acquired: 02/07/2016 Weeks Of Treatment: 1 Clustered Wound: No Photos Photo Uploaded By: Alric Quan on 04/16/2016 16:47:07 Wound Measurements Length: (cm) 1.5 Width: (cm) 0.4 Depth: (cm) 0.1 Area: (cm) 0.471 Volume: (cm) 0.047 % Reduction in Area: 62.5% % Reduction in Volume: 62.7% Epithelialization: None Tunneling: No Undermining: No Wound  Description Classification: Category/Stage II Wound Margin: Flat and Intact Exudate Amount: None Present Foul Odor After Cleansing: No Wound Bed Granulation Amount: None Present (0%) Exposed Structure Necrotic Amount: Large (67-100%) Fascia Exposed: No Necrotic Quality: Eschar Fat Layer Exposed: No Tendon Exposed: No Muscle Exposed: No Joint Exposed: No Handa, Yeudiel ([id number]) Bone Exposed: No Limited  to Skin Breakdown Periwound Skin Texture Texture Color No Abnormalities Noted: No No Abnormalities Noted: No Localized Edema: Yes Temperature / Pain Moisture Temperature: No Abnormality No Abnormalities Noted: No Tenderness on Palpation: Yes Moist: Yes Wound Preparation Ulcer Cleansing: Rinsed/Irrigated with Saline Topical Anesthetic Applied: Other: lidocaine 4%, Treatment Notes Wound #1 (Left, Dorsal Foot) 1. Cleansed with: Clean wound with Normal Saline 2. Anesthetic Topical Lidocaine 4% cream to wound bed prior to debridement 3. Peri-wound Care: Skin Prep 4. Dressing Applied: Aquacel Ag 5. Secondary Dressing Applied Guaze, ABD and kerlix/Conform 7. Secured with Tape Notes netting Electronic Signature(s) Signed: 04/16/2016 5:55:43 PM By: Alric Quan Entered By: Alric Quan on 04/16/2016 09:07:58 Primm, Parv ([id number]) -------------------------------------------------------------------------------- Wound Assessment Details Patient Name: [name] Date of Service: 04/16/2016 8:45 AM Medical Record Number: [id number] Patient Account Number: [id number] Date of Birth/Sex: [birth year] (60 y.o. Male) Treating RN: Ahmed Prima Primary Care Physician: [name] Other Clinician: Referring Physician: [name] Treating Physician/Extender: [name] in Treatment: 1 Wound Status Wound Number: 2 Primary Etiology: Pressure Ulcer Wound Location: Right Foot - Dorsal Wound Status: Open Wounding Event: Pressure Injury Comorbid  History: Anemia Date Acquired: 02/07/2016 Weeks Of Treatment: 1 Clustered Wound: No Photos Photo Uploaded By: Alric Quan on 04/16/2016 16:47:07 Wound Measurements Length: (cm) 0.2 Width: (cm) 0.1 Depth: (cm) 0.1 Area: (cm) 0.016 Volume: (cm) 0.002 % Reduction in Area: 0% % Reduction in Volume: 0% Epithelialization: None Tunneling: No Undermining: No Wound Description Classification: Category/Stage II Wound Margin: Flat and Intact Exudate Amount: None Present Foul Odor After Cleansing: No Wound Bed Granulation Amount: None Present (0%) Exposed Structure Necrotic Amount: Large (67-100%) Fascia Exposed: No Necrotic Quality: Eschar Fat Layer Exposed: No Tendon Exposed: No Muscle Exposed: No Joint Exposed: No Penalver, Nikitas ([id number]) Bone Exposed: No Limited to Skin Breakdown Periwound Skin Texture Texture Color No Abnormalities Noted: No No Abnormalities Noted: No Localized Edema: Yes Temperature / Pain Moisture Temperature: No Abnormality No Abnormalities Noted: No Tenderness on Palpation: Yes Moist: Yes Wound Preparation Ulcer Cleansing: Rinsed/Irrigated with Saline Topical Anesthetic Applied: Other: lidocaine 4%, Treatment Notes Wound #2 (Right, Dorsal Foot) 1. Cleansed with: Clean wound with Normal Saline 2. Anesthetic Topical Lidocaine 4% cream to wound bed prior to debridement 3. Peri-wound Care: Skin Prep 4. Dressing Applied: Aquacel Ag 5. Secondary Dressing Applied Guaze, ABD and kerlix/Conform 7. Secured with Tape Notes netting Electronic Signature(s) Signed: 04/16/2016 5:55:43 PM By: Alric Quan Entered By: Alric Quan on 04/16/2016 09:07:17 Holdman, Khalel ([id number]) -------------------------------------------------------------------------------- Wound Assessment Details Patient Name: [name] Date of Service: 04/16/2016 8:45 AM Medical Record Number: [id number] Patient Account Number: [id number] Date of Birth/Sex:  September 06, 1956 (60 y.o. Male) Treating RN: Ahmed Prima Primary Care Physician: [name] Other Clinician: Referring Physician: [name] Treating Physician/Extender: [name] in Treatment: 1 Wound Status Wound Number: 3 Primary Etiology: Pressure Ulcer Wound Location: Left Calcaneus Wound Status: Open Wounding Event: Pressure Injury Comorbid History: Anemia Date Acquired: 02/07/2016 Weeks Of Treatment: 1 Clustered Wound: No Photos Photo Uploaded By: Alric Quan on 04/16/2016 16:48:52 Wound Measurements Length: (cm) 3.2 Width: (cm) 8.5 Depth: (cm) 0.1 Area: (cm) 21.363 Volume: (cm) 2.136 % Reduction in Area: 0% % Reduction in Volume: 0% Epithelialization: None Tunneling: No Undermining: No Wound Description Classification: Category/Stage II Wound Margin: Flat and Intact Exudate Amount: None Present Foul Odor After Cleansing: No Wound Bed Granulation Amount: None Present (0%) Exposed Structure Necrotic Amount: Large (67-100%) Fascia Exposed: No Necrotic Quality: Eschar Fat Layer Exposed: No Tendon Exposed:  No Muscle Exposed: No Joint Exposed: No [place], Platon ([id number]) Bone Exposed: No Limited to Skin Breakdown Periwound Skin Texture Texture Color No Abnormalities Noted: No No Abnormalities Noted: No Moisture Temperature / Pain No Abnormalities Noted: No Temperature: No Abnormality Dry / Scaly: Yes Tenderness on Palpation: Yes Wound Preparation Ulcer Cleansing: Rinsed/Irrigated with Saline Topical Anesthetic Applied: Other: lidocaine 4%, Treatment Notes Wound #3 (Left Calcaneus) 1. Cleansed with: Clean wound with Normal Saline 2. Anesthetic Topical Lidocaine 4% cream to wound bed prior to debridement 4. Dressing Applied: Santyl Ointment 5. Secondary Dressing Applied Guaze, ABD and kerlix/Conform 7. Secured with Tape Notes netting Electronic Signature(s) Signed: 04/16/2016 5:55:43 PM By: Alric Quan Entered By: Alric Quan on 04/16/2016 09:11:36 Giese, Nam ([id number]) -------------------------------------------------------------------------------- Wound Assessment Details Patient Name: [name] Date of Service: 04/16/2016 8:45 AM Medical Record Number: [id number] Patient Account Number: [id number] Date of Birth/Sex: 06/20/1956 (60 y.o. Male) Treating RN: Ahmed Prima Primary Care Physician: [name] Other Clinician: Referring Physician: [name] Treating Physician/Extender: [name] in Treatment: 1 Wound Status Wound Number: 4 Primary Etiology: Pressure Ulcer Wound Location: Right Calcaneus Wound Status: Open Wounding Event: Pressure Injury Comorbid History: Anemia Date Acquired: 02/07/2016 Weeks Of Treatment: 1 Clustered Wound: No Photos Photo Uploaded By: Alric Quan on 04/16/2016 16:48:52 Wound Measurements Length: (cm) 3.2 Width: (cm) 5 Depth: (cm) 0.1 Area: (cm) 12.566 Volume: (cm) 1.257 % Reduction in Area: 0% % Reduction in Volume: 0% Epithelialization: None Tunneling: No Undermining: No Wound Description Classification: Category/Stage II Wound Margin: Flat and Intact Exudate Amount: None Present Foul Odor After Cleansing: No Wound Bed Granulation Amount: None Present (0%) Exposed Structure Necrotic Amount: Large (67-100%) Fascia Exposed: No Necrotic Quality: Eschar Fat Layer Exposed: No Tendon Exposed: No Muscle Exposed: No Joint Exposed: No Hajjar, Damarea ([id number]) Bone Exposed: No Limited to Skin Breakdown Periwound Skin Texture Texture Color No Abnormalities Noted: No No Abnormalities Noted: No Moisture Temperature / Pain No Abnormalities Noted: No Temperature: No Abnormality Dry / Scaly: Yes Tenderness on Palpation: Yes Wound Preparation Ulcer Cleansing: Rinsed/Irrigated with Saline Topical Anesthetic Applied: Other: lidocaine 4%, Treatment Notes Wound #4 (Right Calcaneus) 1.  Cleansed with: Clean wound with Normal Saline 2. Anesthetic Topical Lidocaine 4% cream to wound bed prior to debridement 4. Dressing Applied: Santyl Ointment 5. Secondary Dressing Applied Guaze, ABD and kerlix/Conform 7. Secured with Tape Notes netting Electronic Signature(s) Signed: 04/16/2016 5:55:43 PM By: Alric Quan Entered By: Alric Quan on 04/16/2016 09:11:56 Dieter, [name] ([id number]) -------------------------------------------------------------------------------- Vitals Details Patient Name: [name] Date of Service: 04/16/2016 8:45 AM Medical Record Number: [id number] Patient Account Number: [id number] Date of Birth/Sex: August 21, 1956 (60 y.o. Male) Treating RN: Ahmed Prima Primary Care Physician: [name] Other Clinician: Referring Physician: [name] Treating Physician/Extender: [name] in Treatment: 1 Vital Signs Time Taken: 08:58 Pulse (bpm): 67 Height (in): 70 Respiratory Rate (breaths/min): 16 Blood Pressure (mmHg): 116/77 Reference Range: 80 - 120 mg / dl Electronic Signature(s) Signed: 04/16/2016 5:55:43 PM By: Alric Quan Entered By: Alric Quan on 04/16/2016 09:00:38

## 2016-04-17 NOTE — Progress Notes (Signed)
BRANNAN, KREUTZER ([id number]) Visit Report for 04/16/2016 Chief Complaint Document Details Patient Name: [name], [name] Date of Service: 04/16/2016 8:45 AM Medical Record Patient Account Number: [id number] Number: Treating RN: Ahmed Prima [birth year] (59 y.o. Other Clinician: Date of Birth/Sex: Male) Treating ROBSON, MICHAEL Primary Care Physician: [name] Physician/Extender: G Referring Physician: Claudie Revering in Treatment: 1 Information Obtained from: Patient Chief Complaint The patient is here for wounds on his bilateral feet supposedly obtained during a hospitalization from 3/24 through 4/6 Electronic Signature(s) Signed: 04/16/2016 5:53:31 PM By: Linton Ham MD Entered By: Linton Ham on 04/16/2016 09:59:41 Ergle, Mackie ([id number]) -------------------------------------------------------------------------------- HPI Details Patient Name: [name] Date of Service: 04/16/2016 8:45 AM Medical Record Patient Account Number: [id number] Number: Treating RN: Ahmed Prima 06-12-1956 (59 y.o. Other Clinician: Date of Birth/Sex: Male) Treating ROBSON, MICHAEL Primary Care Physician: [name] Physician/Extender: G Referring Physician: Claudie Revering in Treatment: 1 History of Present Illness HPI Description: 04/08/16; this is a patient we really don't know too much about. He was hospitalized from 3/24 through 4/6 residing with a sodium of 167 creatinine of 2.49 right lower lobe pneumonia sepsis/septic shock syndrome. He apparently came back to [facility] He apparently had been there only 2 weeks before he went out. I know very little about him premorbidly. The hospital he had a CT scan of the head that showed atrophy and chronic small vessel white matter ischemic changes and remote infarcts also noted within the bilateral basal ganglia. His echocardiogram and  presentation showed a EF of 20-25%, left ventricle was moderately dilated with diffuse hypokinesis. Apparently he return to his assisted living with wounds on his bilateral feet. According to the attendant came with him he could walk before he went out he could no longer walk now. There are wounds on both heels covered with a black eschar and also wounds on his bilateral dorsal feet. ABIs calculated in our clinic for 0.49 bilaterally. He was a smoker up to 2 months ago he is not smoking now. 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 Monophasic waves distally bilaterally. He is going back at the end of the month I believe June 29 probably for an arteriogram as arranged by Dr. Delana Meyer in the meantime he has had no real changes Electronic Signature(s) Signed: 04/16/2016 5:53:31 PM By: Linton Ham MD Entered By: Linton Ham on 04/16/2016 10:01:34 [name] ([id number]) -------------------------------------------------------------------------------- Physical Exam Details Patient Name: [name] Date of Service: 04/16/2016 8:45 AM Medical Record Patient Account Number: [id number] Number: Treating RN: Ahmed Prima Mar 23, 1956 (59 y.o. Other Clinician: Date of Birth/Sex: Male) Treating ROBSON, MICHAEL Primary Care Physician: [name] Physician/Extender: G Referring Physician: [name] Weeks in Treatment: 1 Constitutional Sitting or standing Blood Pressure is within target range for patient.. Pulse regular and within target range for patient.Marland Kitchen Respirations regular, non-labored and within target range.. Patient's appearance is neat and clean. Appears in no acute distress. Well nourished and well developed.. Cardiovascular Femoral pulse on the left not the right. Popliteal pulse on the  left not the right. Pedal pulses absent bilaterally.. Lymphatic None palpable in the popliteal or inguinal area. Psychiatric Somewhat flat affect but not terribly depressed looking. I would wonder about his cognition. He has a history of cerebrovascular disease, I would also wonder  about this at baseline. Notes Wound exam; extensive areas of thick black eschar over his bilateral heels and Achilles. On the top of his feet that he has superficial wounds bilaterally. I can feel a femoral pulse on the left and probably a popliteal pulse on the left but nothing on the right and nothing in his feet Electronic Signature(s) Signed: 04/16/2016 5:53:31 PM By: Linton Ham MD Entered By: Linton Ham on 04/16/2016 10:03:45 [name] ([id number]) -------------------------------------------------------------------------------- Physician Orders Details Patient Name: [name] Date of Service: 04/16/2016 8:45 AM Medical Record Patient Account Number: [id number] Number: Treating RN: Ahmed Prima Apr 23, 1956 (59 y.o. Other Clinician: Date of Birth/Sex: Male) Treating ROBSON, MICHAEL Primary Care Physician: [name] Physician/Extender: G Referring Physician: Claudie Revering in Treatment: 1 Verbal / Phone Orders: Yes Clinician: Pinkerton, Debi Read Back and Verified: Yes Diagnosis Coding Wound Cleansing Wound #1 Left,Dorsal Foot o Clean wound with Normal Saline. Wound #2 Right,Dorsal Foot o Clean wound with Normal Saline. Wound #3 Left Calcaneus o Clean wound with Normal Saline. Wound #4 Right Calcaneus o Clean wound with Normal Saline. Anesthetic Wound #1 Left,Dorsal Foot o Topical Lidocaine 4% cream applied to wound bed prior to debridement - office use only Wound #2 Right,Dorsal Foot o Topical Lidocaine 4% cream applied to wound bed prior to debridement - office use only Wound #3 Left Calcaneus o Topical Lidocaine 4% cream applied to  wound bed prior to debridement - office use only Wound #4 Right Calcaneus o Topical Lidocaine 4% cream applied to wound bed prior to debridement - office use only Primary Wound Dressing Wound #1 Left,Dorsal Foot o Aquacel Ag Wound #2 Right,Dorsal Foot o Aquacel Ag Wound #3 Left Calcaneus o Santyl Ointment Marcussen, Jhamari ([id number]) Wound #4 Right Calcaneus o Santyl Ointment Secondary Dressing Wound #1 Left,Dorsal Foot o ABD pad o Dry Gauze o Conform/Kerlix - tape, netting Wound #2 Right,Dorsal Foot o ABD pad o Dry Gauze o Conform/Kerlix - tape, netting Wound #3 Left Calcaneus o ABD pad o Dry Gauze o Conform/Kerlix - tape, netting Wound #4 Right Calcaneus o ABD pad o Dry Gauze o Conform/Kerlix - tape, netting Dressing Change Frequency Wound #1 Left,Dorsal Foot o Change dressing every other day. Wound #2 Right,Dorsal Foot o Change dressing every other day. Wound #3 Left Calcaneus o Change dressing every other day. Wound #4 Right Calcaneus o Change dressing every other day. Follow-up Appointments Wound #1 Left,Dorsal Foot o Return Appointment in 1 week. Wound #2 Right,Dorsal Foot o Return Appointment in 1 week. Wound #3 Left Calcaneus o Return Appointment in 1 week. Rayner, Morey ([id number]) Wound #4 Right Calcaneus o Return Appointment in 1 week. Off-Loading o Heel suspension boot to: - pt to wear sage boots, please float heels o Turn and reposition every 2 hours o Other: - Advertising account planner Orders / Instructions Wound #1 Left,Dorsal Foot o Increase protein intake. Wound #2 Right,Dorsal Foot o Increase protein intake. Wound #3 Left Calcaneus o Increase protein intake. Wound #4 Right Calcaneus o Increase protein intake. Home Health Wound #1 [place] Visits - [place] *****Please Order [place] for patient***** o Home Health Nurse may visit PRN  to address patientos wound care needs. o FACE TO FACE ENCOUNTER: MEDICARE and MEDICAID PATIENTS: I certify that this patient is under my care and that I had a face-to-face encounter that meets the physician face-to-face encounter requirements with this patient on this date. The encounter with the patient was in whole or in  part for the following MEDICAL CONDITION: (primary reason for Home Healthcare) MEDICAL NECESSITY: I certify, that based on my findings, NURSING services are a medically necessary home health service. HOME BOUND STATUS: I certify that my clinical findings support that this patient is homebound (i.e., Due to illness or injury, pt requires aid of supportive devices such as crutches, cane, wheelchairs, walkers, the use of special transportation or the assistance of another person to leave their place of residence. There is a normal inability to leave the home and doing so requires considerable and taxing effort. Other absences are for medical reasons / religious services and are infrequent or of short duration when for other reasons). o If current dressing causes regression in wound condition, may D/C ordered dressing product/s and apply Normal Saline Moist Dressing daily until next [place] / Other MD appointment. [place] of regression in wound condition at [phone]. o Please direct any NON-WOUND related issues/requests for orders to patient's Primary Care Physician Wound #2 [place] Visits - [place] *****Please Order [place] for patient***** o Home Health Nurse may visit PRN to address patientos wound care needs. o FACE TO FACE ENCOUNTER: MEDICARE and MEDICAID PATIENTS: I certify that this patient is under my care and that I had a face-to-face encounter that meets the physician face-to-face LACONTE, Rachard ([id number]) encounter requirements with this patient on this date. The encounter  with the patient was in whole or in part for the following MEDICAL CONDITION: (primary reason for [place]) MEDICAL NECESSITY: I certify, that based on my findings, NURSING services are a medically necessary home health service. HOME BOUND STATUS: I certify that my clinical findings support that this patient is homebound (i.e., Due to illness or injury, pt requires aid of supportive devices such as crutches, cane, wheelchairs, walkers, the use of special transportation or the assistance of another person to leave their place of residence. There is a normal inability to leave the home and doing so requires considerable and taxing effort. Other absences are for medical reasons / religious services and are infrequent or of short duration when for other reasons). o If current dressing causes regression in wound condition, may D/C ordered dressing product/s and apply Normal Saline Moist Dressing daily until next [place] / Other MD appointment. [place] of regression in wound condition at [phone]. o Please direct any NON-WOUND related issues/requests for orders to patient's Primary Care Physician Wound #3 Left Calcaneus o [place] Visits - [place] *****Please Order [place] for patient***** o Home Health Nurse may visit PRN to address patientos wound care needs. o FACE TO FACE ENCOUNTER: MEDICARE and MEDICAID PATIENTS: I certify that this patient is under my care and that I had a face-to-face encounter that meets the physician face-to-face encounter requirements with this patient on this date. The encounter with the patient was in whole or in part for the following MEDICAL CONDITION: (primary reason for [place]) MEDICAL NECESSITY: I certify, that based on my findings, NURSING services are a medically necessary home health service. HOME BOUND STATUS: I certify that my clinical findings support that this patient is  homebound (i.e., Due to illness or injury, pt requires aid of supportive devices such as crutches, cane, wheelchairs, walkers, the use of special transportation or the assistance of another person to leave their place of residence. There is a normal inability to leave the home and doing so requires considerable  and taxing effort. Other absences are for medical reasons / religious services and are infrequent or of short duration when for other reasons). o If current dressing causes regression in wound condition, may D/C ordered dressing product/s and apply Normal Saline Moist Dressing daily until next [place] / Other MD appointment. [place] of regression in wound condition at [phone]. o Please direct any NON-WOUND related issues/requests for orders to patient's Primary Care Physician Wound #4 Right Calcaneus o [place] Visits - [place] *****Please Order [place] for patient***** o Home Health Nurse may visit PRN to address patientos wound care needs. o FACE TO FACE ENCOUNTER: MEDICARE and MEDICAID PATIENTS: I certify that this patient is under my care and that I had a face-to-face encounter that meets the physician face-to-face encounter requirements with this patient on this date. The encounter with the patient was in whole or in part for the following MEDICAL CONDITION: (primary reason for [place]) MEDICAL NECESSITY: I certify, that based on my findings, NURSING services are a medically necessary home health service. HOME BOUND STATUS: I certify that my clinical findings support that this patient is homebound (i.e., Due to illness or injury, pt requires aid of supportive devices such as crutches, cane, wheelchairs, walkers, the use of special transportation or the assistance of another person to leave their place of residence. There is a normal inability to leave the home and doing so requires considerable and  taxing effort. Other [place], [place] ([id number]) absences are for medical reasons / religious services and are infrequent or of short duration when for other reasons). o If current dressing causes regression in wound condition, may D/C ordered dressing product/s and apply Normal Saline Moist Dressing daily until next [place] / Other MD appointment. [place] of regression in wound condition at [phone]. o Please direct any NON-WOUND related issues/requests for orders to patient's Primary Care Physician Medications-please add to medication list. Wound #1 Left,Dorsal Foot o Santyl Enzymatic Ointment o Other: - Vitamin C, Zinc, Multivitamin Wound #2 Right,Dorsal Foot o Santyl Enzymatic Ointment o Other: - Vitamin C, Zinc, Multivitamin Wound #3 Left Calcaneus o Santyl Enzymatic Ointment o Other: - Vitamin C, Zinc, Multivitamin Wound #4 Right Calcaneus o Santyl Enzymatic Ointment o Other: - Vitamin C, Zinc, Multivitamin Electronic Signature(s) Signed: 04/16/2016 5:53:31 PM By: Linton Ham MD Signed: 04/16/2016 5:55:43 PM By: Alric Quan Entered By: Alric Quan on 04/16/2016 09:33:57 Kocian, Jw ([id number]) -------------------------------------------------------------------------------- Problem List Details Patient Name: [name] Date of Service: 04/16/2016 8:45 AM Medical Record Patient Account Number: [id number] Number: Treating RN: Ahmed Prima [birth year] (59 y.o. 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 Other Clinician: Date of Birth/Sex: Male) Treating ROBSON,  MICHAEL Primary Care Physician: [name] Physician/Extender: G Referring Physician: Claudie Revering in Treatment: 1 Subjective Chief Complaint Information obtained from Patient The patient is here for wounds on his bilateral feet supposedly obtained during a hospitalization from 3/24 through 4/6 History of Present Illness (HPI) 04/08/16; this is a patient we really don't know too much about. He was hospitalized from 3/24 through 4/6 residing with a sodium of 167 creatinine of 2.49 right lower lobe pneumonia sepsis/septic shock syndrome. He apparently came back to [facility] He apparently had been there only 2 weeks before he went out. I know very little about him premorbidly. The hospital he had a CT scan of the head that showed atrophy and chronic small vessel white matter ischemic changes and remote infarcts also noted within the bilateral basal ganglia. His echocardiogram and presentation showed a EF of 20-25%, left ventricle was moderately dilated with diffuse hypokinesis. Apparently he return to his assisted living with wounds on his bilateral feet. According to the attendant came with him he could walk before he went out  he could no longer walk now. There are wounds on both heels covered with a black eschar and also wounds on his bilateral dorsal feet. ABIs calculated in our clinic for 0.49 bilaterally. He was a smoker up to 2 months ago he is not smoking now. 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 Monophasic waves distally bilaterally. He is going back at the end of the month I believe June 29 probably for an arteriogram as arranged by Dr. Delana Meyer in the meantime he has had no real changes Objective Constitutional Sitting or standing Blood Pressure is within target range for patient.. Pulse regular and within target range for patient.Marland Kitchen Respirations regular, non-labored and within target range.. Patient's appearance is neat and clean. Appears in no acute distress. Well nourished and well developed.Marland Kitchen Luckett, Haron ([id number]) Vitals Time Taken: 8:58 AM, Height: 70 in, Pulse: 67 bpm, Respiratory Rate: 16 breaths/min, Blood Pressure: 116/77 mmHg. Cardiovascular Femoral pulse on the left not the right. Popliteal pulse on the left not the right. Pedal pulses absent bilaterally.. Lymphatic None palpable in the popliteal or inguinal area. Psychiatric Somewhat flat affect but not terribly depressed looking. I would wonder about his cognition. He has a history of cerebrovascular disease, I would also wonder about this at baseline. General Notes: Wound exam; extensive areas of thick black eschar over his bilateral heels and Achilles. On the top of his feet that he has superficial wounds bilaterally. I can feel a femoral pulse on the left and probably a popliteal pulse on the left but nothing on the right and nothing in his feet Integumentary (Hair, Skin) Wound #1 status is Open. Original cause of wound was Pressure Injury. The wound is located  on the Left,Dorsal Foot. The wound measures 1.5cm length x 0.4cm width x 0.1cm depth; 0.471cm^2 area and 0.047cm^3 volume. The wound is limited to skin breakdown. There is no tunneling or undermining noted. There is a none present amount of drainage noted. The wound margin is flat and intact. There is no granulation within the wound bed. There is a large (67-100%) amount of necrotic tissue within the wound bed including Eschar. The periwound skin appearance exhibited: Localized Edema, Moist. Periwound temperature was noted as No Abnormality.  The periwound has tenderness on palpation. Wound #2 status is Open. Original cause of wound was Pressure Injury. The wound is located on the Right,Dorsal Foot. The wound measures 0.2cm length x 0.1cm width x 0.1cm depth; 0.016cm^2 area and 0.002cm^3 volume. The wound is limited to skin breakdown. There is no tunneling or undermining noted. There is a none present amount of drainage noted. The wound margin is flat and intact. There is no granulation within the wound bed. There is a large (67-100%) amount of necrotic tissue within the wound bed including Eschar. The periwound skin appearance exhibited: Localized Edema, Moist. Periwound temperature was noted as No Abnormality. The periwound has tenderness on palpation. Wound #3 status is Open. Original cause of wound was Pressure Injury. The wound is located on the Left Calcaneus. The wound measures 3.2cm length x 8.5cm width x 0.1cm depth; 21.363cm^2 area and 2.136cm^3 volume. The wound is limited to skin breakdown. There is no tunneling or undermining noted. There is a none present amount of drainage noted. The wound margin is flat and intact. There is no granulation within the wound bed. There is a large (67-100%) amount of necrotic tissue within the wound bed including Eschar. The periwound skin appearance exhibited: Dry/Scaly. Periwound temperature was noted as No Abnormality. The periwound has tenderness  on palpation. Wound #4 status is Open. Original cause of wound was Pressure Injury. The wound is located on the Right Calcaneus. The wound measures 3.2cm length x 5cm width x 0.1cm depth; 12.566cm^2 area and 1.257cm^3 volume. The wound is limited to skin breakdown. There is no tunneling or undermining noted. There is a none present amount of drainage noted. The wound margin is flat and intact. There is no granulation within the wound bed. There is a large (67-100%) amount of necrotic tissue within the wound Knape, Glover ([id number]) bed including Eschar. The periwound skin appearance exhibited: Dry/Scaly. Periwound temperature was noted as No Abnormality. The periwound has tenderness on palpation. 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 Wound #2 Right,Dorsal Foot: Clean wound with Normal Saline. Wound #3 Left Calcaneus: Clean wound with Normal Saline. Wound #4 Right Calcaneus: Clean wound with Normal Saline. Anesthetic: Wound #1 Left,Dorsal Foot: Topical Lidocaine 4% cream applied to wound bed prior to debridement - office use only Wound #2 Right,Dorsal Foot: Topical Lidocaine 4% cream applied to wound bed prior to debridement - office use only Wound #3 Left Calcaneus: Topical Lidocaine 4% cream applied to wound bed prior to debridement - office use only Wound #4 Right Calcaneus: Topical Lidocaine 4% cream applied to wound bed prior to debridement - office use only Primary Wound Dressing: Wound #1 Left,Dorsal Foot: Aquacel Ag Wound #2 Right,Dorsal Foot: Aquacel Ag Wound #3 Left Calcaneus: Santyl Ointment Wound #4  Right Calcaneus: Santyl Ointment Leverett, Obie ([id number]) Secondary Dressing: Wound #1 Left,Dorsal Foot: ABD pad Dry Gauze Conform/Kerlix - tape, netting Wound #2 Right,Dorsal Foot: ABD pad Dry Gauze Conform/Kerlix - tape, netting Wound #3 Left Calcaneus: ABD pad Dry Gauze Conform/Kerlix - tape, netting Wound #4 Right Calcaneus: ABD pad Dry Gauze Conform/Kerlix - tape, netting Dressing Change Frequency: Wound #1 Left,Dorsal Foot: Change dressing every other day. Wound #2 Right,Dorsal Foot:  Change dressing every other day. Wound #3 Left Calcaneus: Change dressing every other day. Wound #4 Right Calcaneus: Change dressing every other day. Follow-up Appointments: Wound #1 Left,Dorsal Foot: Return Appointment in 1 week. Wound #2 Right,Dorsal Foot: Return Appointment in 1 week. Wound #3 Left Calcaneus: Return Appointment in 1 week. Wound #4 Right Calcaneus: Return Appointment in 1 week. Off-Loading: Heel suspension boot to: - pt to wear sage boots, please float heels Turn and reposition every 2 hours Other: - Advertising account planner Orders / Instructions: Wound #1 Left,Dorsal Foot: Increase protein intake. Wound #2 Right,Dorsal Foot: Increase protein intake. Wound #3 Left Calcaneus: Increase protein intake. Wound #4 Right Calcaneus: Increase protein intake. Home Health: Wound #1 Left,Dorsal Foot: Continue Home Health Visits - [place] *****Please Order [facility] Lift for patient***** SANFORD, VANVLECK ([id number]) Home Health Nurse may visit PRN to address patient s wound care needs. FACE TO FACE ENCOUNTER: MEDICARE and MEDICAID PATIENTS: I certify that this patient is under my care and that I had a face-to-face encounter that meets the physician face-to-face encounter requirements with this patient on this date. The encounter with the patient was in whole or in part for the following MEDICAL CONDITION: (primary reason for [place]) MEDICAL NECESSITY: I  certify, that based on my findings, NURSING services are a medically necessary home health service. HOME BOUND STATUS: I certify that my clinical findings support that this patient is homebound (i.e., Due to illness or injury, pt requires aid of supportive devices such as crutches, cane, wheelchairs, walkers, the use of special transportation or the assistance of another person to leave their place of residence. There is a normal inability to leave the home and doing so requires considerable and taxing effort. Other absences are for medical reasons / religious services and are infrequent or of short duration when for other reasons). If current dressing causes regression in wound condition, may D/C ordered dressing product/s and apply Normal Saline Moist Dressing daily until next [place] / Other MD appointment. [place] of regression in wound condition at [phone]. Please direct any NON-WOUND related issues/requests for orders to patient's Primary Care Physician Wound #2 Right,Dorsal Foot: [place] Visits - [place] *****Please Order [place] for patient***** Home Health Nurse may visit PRN to address patient s wound care needs. FACE TO FACE ENCOUNTER: MEDICARE and MEDICAID PATIENTS: I certify that this patient is under my care and that I had a face-to-face encounter that meets the physician face-to-face encounter requirements with this patient on this date. The encounter with the patient was in whole or in part for the following MEDICAL CONDITION: (primary reason for [place]) MEDICAL NECESSITY: I certify, that based on my findings, NURSING services are a medically necessary home health service. HOME BOUND STATUS: I certify that my clinical findings support that this patient is homebound (i.e., Due to illness or injury, pt requires aid of supportive devices such as crutches, cane, wheelchairs, walkers, the use of special  transportation or the assistance of another person to leave their place of residence. There is a normal inability to leave the home and doing so requires considerable and taxing effort. Other absences are for medical reasons / religious services and are infrequent or of short duration when for other reasons). If current dressing causes regression in wound condition, may D/C ordered dressing product/s and apply Normal Saline Moist Dressing daily until next [place] / Other MD appointment. [place] of regression  in wound condition at [phone]. Please direct any NON-WOUND related issues/requests for orders to patient's Primary Care Physician Wound #3 Left Calcaneus: [place] Visits - [place] *****Please Order [place] for patient***** Home Health Nurse may visit PRN to address patient s wound care needs. FACE TO FACE ENCOUNTER: MEDICARE and MEDICAID PATIENTS: I certify that this patient is under my care and that I had a face-to-face encounter that meets the physician face-to-face encounter requirements with this patient on this date. The encounter with the patient was in whole or in part for the following MEDICAL CONDITION: (primary reason for [place]) MEDICAL NECESSITY: I certify, that based on my findings, NURSING services are a medically necessary home health service. HOME BOUND STATUS: I certify that my clinical findings support that this patient is homebound (i.e., Due to illness or injury, pt requires aid of supportive devices such as crutches, cane, wheelchairs, walkers, the use of special transportation or the assistance of another person to leave their place of residence. There is a normal inability to leave the home and doing so requires considerable and taxing effort. Other absences are for medical reasons / religious services and are infrequent or of short duration when for other reasons). If current dressing causes  regression in wound condition, may D/C ordered dressing product/s and apply Normal Saline Moist Dressing daily until next [place] / Other MD appointment. [place] of regression in wound condition at [phone]. Please direct any NON-WOUND related issues/requests for orders to patient's Primary Care Physician Wound #4 Right Calcaneus: [place] Visits - [place] *****Please Order [facility] Lift for patient***** MEMPHYS, HENSARLING ([id number]) [place] Nurse may visit PRN to address patient s wound care needs. FACE TO FACE ENCOUNTER: MEDICARE and MEDICAID PATIENTS: I certify that this patient is under my care and that I had a face-to-face encounter that meets the physician face-to-face encounter requirements with this patient on this date. The encounter with the patient was in whole or in part for the following MEDICAL CONDITION: (primary reason for [place]) MEDICAL NECESSITY: I certify, that based on my findings, NURSING services are a medically necessary home health service. HOME BOUND STATUS: I certify that my clinical findings support that this patient is homebound (i.e., Due to illness or injury, pt requires aid of supportive devices such as crutches, cane, wheelchairs, walkers, the use of special transportation or the assistance of another person to leave their place of residence. There is a normal inability to leave the home and doing so requires considerable and taxing effort. Other absences are for medical reasons / religious services and are infrequent or of short duration when for other reasons). If current dressing causes regression in wound condition, may D/C ordered dressing product/s and apply Normal Saline Moist Dressing daily until next [place] / Other MD appointment. [place] of regression in wound condition at [phone]. Please direct any NON-WOUND related issues/requests for orders to  patient's Primary Care Physician Medications-please add to medication list.: Wound #1 Left,Dorsal Foot: Santyl Enzymatic Ointment Other: - Vitamin C, Zinc, Multivitamin Wound #2 Right,Dorsal Foot: Santyl Enzymatic Ointment Other: - Vitamin C, Zinc, Multivitamin Wound #3 Left Calcaneus: Santyl Enzymatic Ointment Other: - Vitamin C, Zinc, Multivitamin Wound #4 Right Calcaneus: Santyl Enzymatic Ointment Other: - Vitamin C, Zinc, Multivitamin I'm going to continue with the Santyl based dressings to the areas on his feet I'm not planning to debridement this aggressively until we have more  information about his blood flow presumably from his arteriogram planned later this month If there is not ultimately anyway to augment the blood flow to these areas the decision will be made whether to simply treat this is a palliative case versus continuing debridement and basically hoping for the best with very limited blood flow. I discussed this with his caregivers at his group home . Apparently there is no premorbid information about this man and nobody is really sure if he has a family POA LINAREZ, Mertzon ([id number]) Electronic Signature(s) Signed: 04/16/2016 5:53:31 PM By: Linton Ham MD Entered By: Linton Ham on 04/16/2016 10:05:30 [name] ([id number]) -------------------------------------------------------------------------------- SuperBill Details Patient Name: [name] Date of Service: 04/16/2016 Medical Record Patient Account Number: [id number] Number: Treating RN: Ahmed Prima 10-07-1956 (59 y.o. 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

## 2016-04-30 ENCOUNTER — Encounter: Payer: Medicare Other | Admitting: Internal Medicine

## 2016-04-30 DIAGNOSIS — L89610 Pressure ulcer of right heel, unstageable: Secondary | ICD-10-CM | POA: Diagnosis not present

## 2016-05-01 NOTE — Progress Notes (Signed)
BRENTEN, HUITRON ([id number]) Visit Report for 04/30/2016 Arrival Information Details Patient Name: [name], [name] Date of Service: 04/30/2016 12:45 PM Medical Record Number: [id number] Patient Account Number: [id number] Date of Birth/Sex: 1956-03-07 (60 y.o. Male) Treating RN: Ahmed Prima Primary Care Physician: [name] Other Clinician: Referring Physician: [name] Treating Physician/Extender: [name] in Treatment: 3 Visit Information History Since Last Visit All ordered tests and consults were completed: No Patient Arrived: Wheel Chair Added or deleted any medications: No Arrival Time: 12:54 Any new allergies or adverse reactions: No Accompanied By: caregiver Had a fall or experienced change in No Transfer Assistance: EasyPivot activities of daily living that may affect Patient Lift risk of falls: Patient Identification Verified: Yes Signs or symptoms of abuse/neglect since last No Secondary Verification Process Yes visito Completed: Hospitalized since last visit: No Patient Requires Transmission- No Pain Present Now: No Based Precautions: Patient Has Alerts: No Electronic Signature(s) Signed: 04/30/2016 5:56:28 PM By: Alric Quan Entered By: Alric Quan on 04/30/2016 12:54:57 Romanski, [name] ([id number]) -------------------------------------------------------------------------------- Clinic Level of Care Assessment Details Patient Name: [name] Date of Service: 04/30/2016 12:45 PM Medical Record Number: [id number] Patient Account Number: [id number] Date of Birth/Sex: 1956-10-21 (60 y.o. Male) Treating RN: Carolyne Fiscal, Debi Primary Care Physician: [name] Other Clinician: Referring Physician: [name] Treating Physician/Extender: [name] in Treatment: 3 Clinic Level of Care Assessment Items TOOL 4 Quantity Score X - Use when only an EandM is performed on FOLLOW-UP visit 1 0 ASSESSMENTS - Nursing  Assessment / Reassessment X - Reassessment of Co-morbidities (includes updates in patient status) 1 10 X - Reassessment of Adherence to Treatment Plan 1 5 ASSESSMENTS - Wound and Skin Assessment / Reassessment []  - Simple Wound Assessment / Reassessment - one wound 0 X - Complex Wound Assessment / Reassessment - multiple wounds 4 5 []  - Dermatologic / Skin Assessment (not related to wound area) 0 ASSESSMENTS - Focused Assessment []  - Circumferential Edema Measurements - multi extremities 0 []  - Nutritional Assessment / Counseling / Intervention 0 []  - Lower Extremity Assessment (monofilament, tuning fork, pulses) 0 []  - Peripheral Arterial Disease Assessment (using hand held doppler) 0 ASSESSMENTS - Ostomy and/or Continence Assessment and Care []  - Incontinence Assessment and Management 0 []  - Ostomy Care Assessment and Management (repouching, etc.) 0 PROCESS - Coordination of Care []  - Simple Patient / Family Education for ongoing care 0 X - Complex (extensive) Patient / Family Education for ongoing care 1 20 X - Staff obtains Programmer, systems, Records, Test Results / Process Orders 1 10 X - Staff telephones HHA, Nursing Homes / Clarify orders / etc 1 10 []  - Routine Transfer to another Facility (non-emergent condition) 0 Lasalle, Kien ([id number]) []  - Routine Hospital Admission (non-emergent condition) 0 []  - New Admissions / Biomedical engineer / Ordering NPWT, Apligraf, etc. 0 []  - Emergency Hospital Admission (emergent condition) 0 X - Simple Discharge Coordination 1 10 []  - Complex (extensive) Discharge Coordination 0 PROCESS - Special Needs []  - Pediatric / Minor Patient Management 0 []  - Isolation Patient Management 0 []  - Hearing / Language / Visual special needs 0 []  - Assessment of Community assistance (transportation, D/C planning, etc.) 0 []  - Additional assistance / Altered mentation 0 []  - Support Surface(s) Assessment (bed, cushion, seat, etc.) 0 INTERVENTIONS - Wound  Cleansing / Measurement X - Simple Wound Cleansing - one wound 1 5 []  - Complex Wound Cleansing - multiple wounds 0 X - Wound Imaging (photographs - any number of wounds)  1 5 []  - Wound Tracing (instead of photographs) 0 X - Simple Wound Measurement - one wound 1 5 []  - Complex Wound Measurement - multiple wounds 0 INTERVENTIONS - Wound Dressings X - Small Wound Dressing one or multiple wounds 4 10 []  - Medium Wound Dressing one or multiple wounds 0 []  - Large Wound Dressing one or multiple wounds 0 X - Application of Medications - topical 1 5 []  - Application of Medications - injection 0 INTERVENTIONS - Miscellaneous []  - External ear exam 0 Luse, Marquin ([id number]) []  - Specimen Collection (cultures, biopsies, blood, body fluids, etc.) 0 []  - Specimen(s) / Culture(s) sent or taken to Lab for analysis 0 []  - Patient Transfer (multiple staff / Harrel Lemon Lift / Similar devices) 0 []  - Simple Staple / Suture removal (25 or less) 0 []  - Complex Staple / Suture removal (26 or more) 0 []  - Hypo / Hyperglycemic Management (close monitor of Blood Glucose) 0 []  - Ankle / Brachial Index (ABI) - do not check if billed separately 0 X - Vital Signs 1 5 Has the patient been seen at the hospital within the last three years: Yes Total Score: 150 Level Of Care: New/Established - Level 4 Electronic Signature(s) Signed: 04/30/2016 5:56:28 PM By: Alric Quan Entered By: Alric Quan on 04/30/2016 16:44:08 Abebe, [name] ([id number]) -------------------------------------------------------------------------------- Encounter Discharge Information Details Patient Name: [name] Date of Service: 04/30/2016 12:45 PM Medical Record Number: [id number] Patient Account Number: [id number] Date of Birth/Sex: 02/22/1956 (60 y.o. Male) Treating RN: Ahmed Prima Primary Care Physician: [name] Other Clinician: Referring Physician: [name] Treating Physician/Extender: [name] in Treatment: 3 Encounter Discharge Information Items Discharge Pain Level: 0 Discharge Condition: Stable Ambulatory Status: Wheelchair Discharge Destination: Nursing Home Transportation: Other Accompanied By: caregiver Schedule Follow-up Appointment: Yes Medication Reconciliation completed and provided to Patient/Care Yes Cambryn Charters: Provided on Clinical Summary of Care: 04/30/2016 Form Type Recipient Paper Patient DG Electronic Signature(s) Signed: 04/30/2016 1:47:13 PM By: [name] Entered By: [name] on 04/30/2016 13:47:13 Schild, Jerald ([id number]) -------------------------------------------------------------------------------- Lower Extremity Assessment Details Patient Name: [name] Date of Service: 04/30/2016 12:45 PM Medical Record Number: [id number] Patient Account Number: [id number] Date of Birth/Sex: 08/15/1956 (60 y.o. Male) Treating RN: Ahmed Prima Primary Care Physician: [name] Other Clinician: Referring Physician: [name] Treating Physician/Extender: [name] in Treatment: 3 Vascular Assessment Pulses: Posterior Tibial Dorsalis Pedis Palpable: [Left:No] [Right:No] Doppler: [Left:Monophasic] [Right:Monophasic] Extremity colors, hair growth, and conditions: Temperature of Extremity: [Left:Cool] [Right:Cool] Capillary Refill: [Left:> 3 seconds] [Right:> 3 seconds] Electronic Signature(s) Signed: 04/30/2016 5:56:28 PM By: Alric Quan Entered By: Alric Quan on 04/30/2016 12:56:02 Coronado, [name] ([id number]) -------------------------------------------------------------------------------- Multi Wound Chart Details Patient Name: [name] Date of Service: 04/30/2016 12:45 PM Medical Record Number: [id number] Patient Account Number: [id number] Date of Birth/Sex: [birth year] (60 y.o. Male) Treating RN: Carolyne Fiscal, Debi Primary Care Physician: [name] Other Clinician: Referring Physician:  [name] Treating Physician/Extender: [name] in Treatment: 3 Photos: [1:No Photos] [2:No Photos] [3:No Photos] Wound Location: [1:Left, Dorsal Foot] [2:Right Foot - Dorsal] [3:Left Calcaneus] Wounding Event: [1:Pressure Injury] [2:Pressure Injury] [3:Pressure Injury] Primary Etiology: [1:Pressure Ulcer] [2:Pressure Ulcer] [3:Pressure Ulcer] Comorbid History: [1:N/A] [2:Anemia] [3:Anemia] Date Acquired: [1:02/07/2016] [2:02/07/2016] [3:02/07/2016] Weeks of Treatment: [1:3] [2:3] [3:3] Wound Status: [1:Healed - Epithelialized] [2:Open] [3:Open] Measurements L x W x D 0x0x0 [2:0.1x0.1x0.1] [3:3.2x8.5x0.1] (cm) Area (cm) : [1:0] [2:0.008] [3:21.363] Volume (cm) : [1:0] [2:0.001] [3:2.136] % Reduction in Area: [1:100.00%] [2:50.00%] [3:0.00%] % Reduction in  Volume: 100.00% [2:50.00%] [3:0.00%] Classification: [1:Category/Stage II] [2:Category/Stage II] [3:Category/Stage II] Exudate Amount: [1:N/A] [2:Small] [3:None Present] Exudate Type: [1:N/A] [2:Serous] [3:N/A] Exudate Color: [1:N/A] [2:amber] [3:N/A] Wound Margin: [1:N/A] [2:Flat and Intact] [3:Flat and Intact] Granulation Amount: [1:N/A] [2:Large (67-100%)] [3:None Present (0%)] Granulation Quality: [1:N/A] [2:Pink] [3:N/A] Necrotic Amount: [1:N/A] [2:None Present (0%)] [3:Large (67-100%)] Necrotic Tissue: [1:N/A] [2:N/A] [3:Eschar] Epithelialization: [1:N/A] [2:None] [3:None] Periwound Skin Texture: No Abnormalities Noted [2:Edema: Yes] [3:No Abnormalities Noted] Periwound Skin [1:No Abnormalities Noted] [2:Moist: Yes] [3:Dry/Scaly: Yes] Moisture: Periwound Skin Color: No Abnormalities Noted [2:No Abnormalities Noted] [3:No Abnormalities Noted] Temperature: [1:N/A] [2:No Abnormality] [3:No Abnormality] Tenderness on [1:No] [2:Yes] [3:Yes] Palpation: Wound Preparation: [1:N/A] [2:Ulcer Cleansing: Rinsed/Irrigated with Saline] [3:Ulcer Cleansing: Rinsed/Irrigated with Saline] Topical Anesthetic Topical  Anesthetic Applied: None Applied: Other: lidocaine 4% Wendorf, Leontae ([id number]) Wound Number: 4 N/A N/A Photos: No Photos N/A N/A Wound Location: Right Calcaneus N/A N/A Wounding Event: Pressure Injury N/A N/A Primary Etiology: Pressure Ulcer N/A N/A Comorbid History: Anemia N/A N/A Date Acquired: 02/07/2016 N/A N/A Weeks of Treatment: 3 N/A N/A Wound Status: Open N/A N/A Measurements L x W x D 3.2x5x0.1 N/A N/A (cm) Area (cm) : 12.566 N/A N/A Volume (cm) : 1.257 N/A N/A % Reduction in Area: 0.00% N/A N/A % Reduction in Volume: 0.00% N/A N/A Classification: Category/Stage II N/A N/A Exudate Amount: None Present N/A N/A Exudate Type: N/A N/A N/A Exudate Color: N/A N/A N/A Wound Margin: Flat and Intact N/A N/A Granulation Amount: None Present (0%) N/A N/A Granulation Quality: N/A N/A N/A Necrotic Amount: Large (67-100%) N/A N/A Necrotic Tissue: Eschar N/A N/A Exposed Structures: Fascia: No N/A N/A Fat: No Tendon: No Muscle: No Joint: No Bone: No Limited to Skin Breakdown Epithelialization: None N/A N/A Periwound Skin Texture: No Abnormalities Noted N/A N/A Periwound Skin Dry/Scaly: Yes N/A N/A Moisture: Periwound Skin Color: No Abnormalities Noted N/A N/A Temperature: No Abnormality N/A N/A Tenderness on Yes N/A N/A Palpation: Wound Preparation: Ulcer Cleansing: N/A N/A Rinsed/Irrigated with Saline Topical Anesthetic Applied: None Treatment Notes SYAIRE, ANGLADE ([id number]) Electronic Signature(s) Signed: 04/30/2016 5:56:28 PM By: Alric Quan Entered By: Alric Quan on 04/30/2016 13:02:42 Gjerde, [name] ([id number]) -------------------------------------------------------------------------------- Multi-Disciplinary Care Plan Details Patient Name: [name] Date of Service: 04/30/2016 12:45 PM Medical Record Number: [id number] Patient Account Number: [id number] Date of Birth/Sex: [birth year] (60 y.o. Male) Treating RN: Ahmed Prima Primary Care  Physician: [name] Other Clinician: Referring Physician: [name] Treating Physician/Extender: [name] in Treatment: 3 Active Inactive Abuse / Safety / Falls / Self Care Management Nursing Diagnoses: Potential for falls Goals: Patient will remain injury free Date Initiated: 04/08/2016 Goal Status: Active Interventions: Assess fall risk on admission and as needed Notes: Nutrition Nursing Diagnoses: Imbalanced nutrition Goals: Patient/caregiver agrees to and verbalizes understanding of need to use nutritional supplements and/or vitamins as prescribed Date Initiated: 04/08/2016 Goal Status: Active Interventions: Assess patient nutrition upon admission and as needed per policy Notes: Orientation to the Wound Care Program Nursing Diagnoses: Knowledge deficit related to the wound healing center program Goals: Patient/caregiver will verbalize understanding of the [facility] ([id number]) Date Initiated: 04/08/2016 Goal Status: Active Interventions: Provide education on orientation to the wound center Notes: Pain, Acute or Chronic Nursing Diagnoses: Pain, acute or chronic: actual or potential Potential alteration in comfort, pain Goals: Patient will verbalize adequate pain control and receive pain control interventions during procedures as needed Date Initiated: 04/08/2016 Goal Status: Active Interventions: Assess comfort goal upon admission Complete pain assessment as per  visit requirements Notes: Pressure Nursing Diagnoses: Knowledge deficit related to causes and risk factors for pressure ulcer development Knowledge deficit related to management of pressures ulcers Goals: Patient will remain free from development of additional pressure ulcers Date Initiated: 04/08/2016 Goal Status: Active Interventions: Assess offloading mechanisms upon admission and as needed Assess potential for pressure ulcer upon  admission and as needed Notes: Wound/Skin Impairment Nursing Diagnoses: Impaired tissue integrity Dokes, Ja ([id number]) Goals: Ulcer/skin breakdown will have a volume reduction of 30% by week 4 Date Initiated: 04/08/2016 Goal Status: Active Ulcer/skin breakdown will have a volume reduction of 50% by week 8 Date Initiated: 04/08/2016 Goal Status: Active Ulcer/skin breakdown will have a volume reduction of 80% by week 12 Date Initiated: 04/08/2016 Goal Status: Active Interventions: Assess ulceration(s) every visit Notes: Electronic Signature(s) Signed: 04/30/2016 5:56:28 PM By: Alric Quan Entered By: Alric Quan on 04/30/2016 13:02:35 Willinger, [name] ([id number]) -------------------------------------------------------------------------------- Pain Assessment Details Patient Name: [name] Date of Service: 04/30/2016 12:45 PM Medical Record Number: [id number] Patient Account Number: [id number] Date of Birth/Sex: [birth year] (60 y.o. Male) Treating RN: Ahmed Prima Primary Care Physician: [name] Other Clinician: Referring Physician: [name] Treating Physician/Extender: [name] in Treatment: 3 Active Problems Location of Pain Severity and Description of Pain Patient Has Paino No Site Locations Pain Management and Medication Current Pain Management: Electronic Signature(s) Signed: 04/30/2016 5:56:28 PM By: Alric Quan Entered By: Alric Quan on 04/30/2016 12:55:03 Borge, [name] ([id number]) -------------------------------------------------------------------------------- Patient/Caregiver Education Details Patient Name: [name] Date of Service: 04/30/2016 12:45 PM Medical Record Number: [id number] Patient Account Number: [id number] Date of Birth/Gender: Mar 14, 1956 (60 y.o. Male) Treating RN: Carolyne Fiscal, Debi Primary Care Physician: [name] Other Clinician: Referring Physician: [name] Treating  Physician/Extender: [name] in Treatment: 3 Education Assessment Education Provided To: Patient and Caregiver Education Topics Provided Wound/Skin Impairment: Handouts: Other: change dressing as ordered Methods: Demonstration, Explain/Verbal Responses: State content correctly Electronic Signature(s) Signed: 04/30/2016 5:56:28 PM By: Alric Quan Entered By: Alric Quan on 04/30/2016 13:11:29 Arndt, [name] ([id number]) -------------------------------------------------------------------------------- Wound Assessment Details Patient Name: [name] Date of Service: 04/30/2016 12:45 PM Medical Record Number: [id number] Patient Account Number: [id number] Date of Birth/Sex: August 17, 1956 (60 y.o. Male) Treating RN: Carolyne Fiscal, Debi Primary Care Physician: [name] Other Clinician: Referring Physician: [name] Treating Physician/Extender: [name] in Treatment: 3 Wound Status Wound Number: 1 Primary Etiology: Pressure Ulcer Wound Location: Left Foot - Dorsal Wound Status: Open Wounding Event: Pressure Injury Comorbid History: Anemia Date Acquired: 02/07/2016 Weeks Of Treatment: 3 Clustered Wound: No Photos Photo Uploaded By: Alric Quan on 04/30/2016 17:54:11 Wound Measurements Length: (cm) 0.1 Width: (cm) 0.1 Depth: (cm) 0.1 Area: (cm) 0 Volume: (cm) 0 % Reduction in Area: 100% % Reduction in Volume: 100% Epithelialization: None Tunneling: No Undermining: No Wound Description Classification: Category/Stage II Wound Margin: Flat and Intact Exudate Amount: Small Exudate Type: Serous Exudate Color: amber Foul Odor After Cleansing: No Wound Bed Granulation Amount: Large (67-100%) Exposed Structure Granulation Quality: Red Fascia Exposed: No Necrotic Amount: None Present (0%) Fat Layer Exposed: No Tendon Exposed: No Papaleo, Kelcey ([id number]) Muscle Exposed: No Joint Exposed: No Bone Exposed: No Limited  to Skin Breakdown Periwound Skin Texture Texture Color No Abnormalities Noted: No No Abnormalities Noted: No Localized Edema: Yes Temperature / Pain Moisture Temperature: No Abnormality No Abnormalities Noted: No Tenderness on Palpation: Yes Moist: Yes Wound Preparation Ulcer Cleansing: Rinsed/Irrigated with Saline Topical Anesthetic Applied: None Treatment Notes Wound #1 (Left, Dorsal Foot) 1. Cleansed with: Clean  wound with Normal Saline 3. Peri-wound Care: Skin Prep 4. Dressing Applied: Foam 5. Secondary Dressing Applied Kerlix/Conform 7. Secured with Tape Notes netting Electronic Signature(s) Signed: 04/30/2016 5:56:28 PM By: Alric Quan Entered By: Alric Quan on 04/30/2016 13:06:29 Schemm, [name] ([id number]) -------------------------------------------------------------------------------- Wound Assessment Details Patient Name: [name] Date of Service: 04/30/2016 12:45 PM Medical Record Number: [id number] Patient Account Number: [id number] Date of Birth/Sex: 01-Sep-1956 (60 y.o. Male) Treating RN: Ahmed Prima Primary Care Physician: [name] Other Clinician: Referring Physician: [name] Treating Physician/Extender: [name] in Treatment: 3 Wound Status Wound Number: 2 Primary Etiology: Pressure Ulcer Wound Location: Right Foot - Dorsal Wound Status: Open Wounding Event: Pressure Injury Comorbid History: Anemia Date Acquired: 02/07/2016 Weeks Of Treatment: 3 Clustered Wound: No Photos Photo Uploaded By: Alric Quan on 04/30/2016 17:54:11 Wound Measurements Length: (cm) 0.1 Width: (cm) 0.1 Depth: (cm) 0.1 Area: (cm) 0.008 Volume: (cm) 0.001 % Reduction in Area: 50% % Reduction in Volume: 50% Epithelialization: None Tunneling: No Undermining: No Wound Description Classification: Category/Stage II Wound Margin: Flat and Intact Exudate Amount: Small Exudate Type: Serous Exudate Color: amber Foul  Odor After Cleansing: No Wound Bed Granulation Amount: Large (67-100%) Exposed Structure Granulation Quality: Pink Fascia Exposed: No Necrotic Amount: None Present (0%) Fat Layer Exposed: No Tendon Exposed: No Hershman, Neco ([id number]) Muscle Exposed: No Joint Exposed: No Bone Exposed: No Limited to Skin Breakdown Periwound Skin Texture Texture Color No Abnormalities Noted: No No Abnormalities Noted: No Localized Edema: Yes Temperature / Pain Moisture Temperature: No Abnormality No Abnormalities Noted: No Tenderness on Palpation: Yes Moist: Yes Wound Preparation Ulcer Cleansing: Rinsed/Irrigated with Saline Topical Anesthetic Applied: None Treatment Notes Wound #2 (Right, Dorsal Foot) 1. Cleansed with: Clean wound with Normal Saline 3. Peri-wound Care: Skin Prep 4. Dressing Applied: Foam 5. Secondary Dressing Applied Kerlix/Conform 7. Secured with Tape Notes netting Electronic Signature(s) Signed: 04/30/2016 5:56:28 PM By: Alric Quan Entered By: Alric Quan on 04/30/2016 13:01:37 Ohaver, [name] ([id number]) -------------------------------------------------------------------------------- Wound Assessment Details Patient Name: [name] Date of Service: 04/30/2016 12:45 PM Medical Record Number: [id number] Patient Account Number: [id number] Date of Birth/Sex: 07/09/1956 (60 y.o. Male) Treating RN: Ahmed Prima Primary Care Physician: [name] Other Clinician: Referring Physician: [name] Treating Physician/Extender: [name] in Treatment: 3 Wound Status Wound Number: 3 Primary Etiology: Pressure Ulcer Wound Location: Left Calcaneus Wound Status: Open Wounding Event: Pressure Injury Comorbid History: Anemia Date Acquired: 02/07/2016 Weeks Of Treatment: 3 Clustered Wound: No Photos Photo Uploaded By: Alric Quan on 04/30/2016 17:54:26 Wound Measurements Length: (cm) 3.2 Width: (cm) 8.5 Depth: (cm)  0.1 Area: (cm) 21.363 Volume: (cm) 2.136 % Reduction in Area: 0% % Reduction in Volume: 0% Epithelialization: None Tunneling: No Undermining: No Wound Description Classification: Category/Stage II Wound Margin: Flat and Intact Exudate Amount: None Present Foul Odor After Cleansing: No Wound Bed Granulation Amount: None Present (0%) Exposed Structure Necrotic Amount: Large (67-100%) Fascia Exposed: No Necrotic Quality: Eschar Fat Layer Exposed: No Tendon Exposed: No Muscle Exposed: No Joint Exposed: No Glassner, Kandon ([id number]) Bone Exposed: No Limited to Skin Breakdown Periwound Skin Texture Texture Color No Abnormalities Noted: No No Abnormalities Noted: No Moisture Temperature / Pain No Abnormalities Noted: No Temperature: No Abnormality Dry / Scaly: Yes Tenderness on Palpation: Yes Wound Preparation Ulcer Cleansing: Rinsed/Irrigated with Saline Topical Anesthetic Applied: Other: lidocaine 4%, Treatment Notes Wound #3 (Left Calcaneus) 1. Cleansed with: Clean wound with Normal Saline 2. Anesthetic Topical Lidocaine 4% cream to wound bed prior to debridement 4.  Dressing Applied: Santyl Ointment 5. Secondary Dressing Applied ABD Pad Dry Gauze Foam Kerlix/Conform 7. Secured with Tape Notes netting Electronic Signature(s) Signed: 04/30/2016 5:56:28 PM By: Alric Quan Entered By: Alric Quan on 04/30/2016 13:01:57 Ferrero, Jarrid ([id number]) -------------------------------------------------------------------------------- Wound Assessment Details Patient Name: [name] Date of Service: 04/30/2016 12:45 PM Medical Record Number: [id number] Patient Account Number: [id number] Date of Birth/Sex: Dec 02, 1955 (60 y.o. Male) Treating RN: Ahmed Prima Primary Care Physician: [name] Other Clinician: Referring Physician: [name] Treating Physician/Extender: [name] in Treatment: 3 Wound Status Wound Number:  4 Primary Etiology: Pressure Ulcer Wound Location: Right Calcaneus Wound Status: Open Wounding Event: Pressure Injury Comorbid History: Anemia Date Acquired: 02/07/2016 Weeks Of Treatment: 3 Clustered Wound: No Photos Photo Uploaded By: Alric Quan on 04/30/2016 17:54:27 Wound Measurements Length: (cm) 3.2 Width: (cm) 5 Depth: (cm) 0.1 Area: (cm) 12.566 Volume: (cm) 1.257 % Reduction in Area: 0% % Reduction in Volume: 0% Epithelialization: None Tunneling: No Undermining: No Wound Description Classification: Category/Stage II Wound Margin: Flat and Intact Exudate Amount: None Present Foul Odor After Cleansing: No Wound Bed Granulation Amount: None Present (0%) Exposed Structure Necrotic Amount: Large (67-100%) Fascia Exposed: No Necrotic Quality: Eschar Fat Layer Exposed: No Tendon Exposed: No Muscle Exposed: No Joint Exposed: No Duty, Denny ([id number]) Bone Exposed: No Limited to Skin Breakdown Periwound Skin Texture Texture Color No Abnormalities Noted: No No Abnormalities Noted: No Moisture Temperature / Pain No Abnormalities Noted: No Temperature: No Abnormality Dry / Scaly: Yes Tenderness on Palpation: Yes Wound Preparation Ulcer Cleansing: Rinsed/Irrigated with Saline Topical Anesthetic Applied: None Treatment Notes Wound #4 (Right Calcaneus) 1. Cleansed with: Clean wound with Normal Saline 2. Anesthetic Topical Lidocaine 4% cream to wound bed prior to debridement 4. Dressing Applied: Santyl Ointment 5. Secondary Dressing Applied ABD Pad Dry Gauze Foam Kerlix/Conform 7. Secured with Tape Notes netting Electronic Signature(s) Signed: 04/30/2016 5:56:28 PM By: Alric Quan Entered By: Alric Quan on 04/30/2016 13:02:27 [name] ([id number]) -------------------------------------------------------------------------------- Vitals Details Patient Name: [name] Date of Service: 04/30/2016 12:45 PM Medical Record  Number: [id number] Patient Account Number: [id number] Date of Birth/Sex: 11/30/1955 (60 y.o. Male) Treating RN: Ahmed Prima Primary Care Physician: [name] Other Clinician: Referring Physician: [name] Treating Physician/Extender: [name] in Treatment: 3 Vital Signs Time Taken: 13:08 Pulse (bpm): 86 Height (in): 70 Respiratory Rate (breaths/min): 16 Blood Pressure (mmHg): 104/69 Reference Range: 80 - 120 mg / dl Electronic Signature(s) Signed: 04/30/2016 5:56:28 PM By: Alric Quan Entered By: Alric Quan on 04/30/2016 13:08:23

## 2016-05-01 NOTE — Progress Notes (Signed)
THORNE, SIEGENTHALER ([id number]) Visit Report for 04/30/2016 Chief Complaint Document Details Patient Name: [name], [name] Date of Service: 04/30/2016 12:45 PM Medical Record Patient Account Number: [id number] Number: Treating RN: Ahmed Prima Jan 13, 1956 (59 y.o. Other Clinician: Date of Birth/Sex: Male) Treating Edison Nicholson Primary Care Physician: [name] Physician/Extender: G Referring Physician: Claudie Revering in Treatment: 3 Information Obtained from: Patient Chief Complaint The patient is here for wounds on his bilateral feet supposedly obtained during a hospitalization from 3/24 through 4/6 Electronic Signature(s) Signed: 04/30/2016 4:37:54 PM By: Linton Ham MD Entered By: Linton Ham on 04/30/2016 14:09:09 [name] ([id number]) -------------------------------------------------------------------------------- HPI Details Patient Name: [name] Date of Service: 04/30/2016 12:45 PM Medical Record Patient Account Number: [id number] Number: Treating RN: Ahmed Prima 21-Feb-1956 (59 y.o. Other Clinician: Date of Birth/Sex: Male) Treating Jamorris Ndiaye Primary Care Physician: [name] Physician/Extender: G Referring Physician: Claudie Revering in Treatment: 3 History of Present Illness HPI Description: 04/08/16; this is a patient we really don't know too much about. He was hospitalized from 3/24 through 4/6 residing with a sodium of 167 creatinine of 2.49 right lower lobe pneumonia sepsis/septic shock syndrome. He apparently came back to [facility] He apparently had been there only 2 weeks before he went out. I know very little about him premorbidly. The hospital he had a CT scan of the head that showed atrophy and chronic small vessel white matter ischemic changes and remote infarcts also noted within the bilateral basal ganglia. His echocardiogram and  presentation showed a EF of 20-25%, left ventricle was moderately dilated with diffuse hypokinesis. Apparently he return to his assisted living with wounds on his bilateral feet. According to the attendant came with him he could walk before he went out he could no longer walk now. There are wounds on both heels covered with a black eschar and also wounds on his bilateral dorsal feet. ABIs calculated in our clinic for 0.49 bilaterally. He was a smoker up to 2 months ago he is not smoking now. 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 Monophasic waves distally bilaterally. He is going back at the end of the month I believe June 29 probably for an arteriogram as arranged by Dr. Delana Meyer in the meantime he has had no real changes 04/30/16; we are waiting vascular review on June 29. The black eschar over his bilateral heels is beginning to separate using Santyl. The areas on the dorsal aspect of his feet looked considerably better/resolved. He apparently has no complaints. He also has a severe presumably ischemic cardiomyopathy as noted above Electronic Signature(s) Signed: 04/30/2016 4:37:54 PM By: Linton Ham MD Entered By: Linton Ham on 04/30/2016 14:10:07 [name] ([id number]) -------------------------------------------------------------------------------- Physical Exam Details Patient Name: [name] Date of Service: 04/30/2016 12:45 PM Medical Record Patient Account Number: [id number] Number: Treating RN: Ahmed Prima Jun 02, 1956 (59 y.o. Other Clinician: Date of Birth/Sex: Male) Treating Matraca Hunkins Primary Care Physician: [name] Physician/Extender: G Referring Physician: [name] Weeks in Treatment: 3 Constitutional Sitting or standing Blood Pressure is within target  range for patient.. Pulse regular and within target range for patient.Marland Kitchen Respirations regular, non-labored and within target range.. Patient's appearance is neat and clean. Appears in no acute distress. Well nourished and well developed.Marland Kitchen Respiratory Respiratory effort is easy and  symmetric bilaterally. Rate is normal at rest and on room air.. Bilateral breath sounds are clear and equal in all lobes with no wheezes, rales or rhonchi.. Cardiovascular 2/6 pansystolic murmur at the lower left sternal border. JVP not elevated he appears to be euvolemic. Femoral arteries without bruits and pulses strong.. Pedal pulses absent bilaterally.. Gastrointestinal (GI) Abdomen is soft and non-distended without masses or tenderness. Bowel sounds active in all quadrants.. No liver or spleen enlargement or tenderness.. Lymphatic Nonpalpable in the popliteal or inguinal area. Psychiatric Flat affect, some degree of cognitive impairment. Notes Wound exam; continued areas of thick black eschar over his bilateral heels. These are beginning to separate a bit. There is no evidence of infection Electronic Signature(s) Signed: 04/30/2016 4:37:54 PM By: Linton Ham MD Entered By: Linton Ham on 04/30/2016 14:12:01 [name] ([id number]) -------------------------------------------------------------------------------- Physician Orders Details Patient Name: [name] Date of Service: 04/30/2016 12:45 PM Medical Record Patient Account Number: [id number] Number: Treating RN: Ahmed Prima May 05, 1956 (59 y.o. Other Clinician: Date of Birth/Sex: Male) Treating Desiree Fleming Primary Care Physician: [name] Physician/Extender: G Referring Physician: Claudie Revering in Treatment: 3 Verbal / Phone Orders: Yes Clinician: Pinkerton, Debi Read Back and Verified: Yes Diagnosis Coding Wound Cleansing Wound #1 Left,Dorsal Foot o Clean wound with Normal Saline. Wound #2  Right,Dorsal Foot o Clean wound with Normal Saline. Wound #3 Left Calcaneus o Clean wound with Normal Saline. Wound #4 Right Calcaneus o Clean wound with Normal Saline. Anesthetic Wound #1 Left,Dorsal Foot o Topical Lidocaine 4% cream applied to wound bed prior to debridement - office use only Wound #2 Right,Dorsal Foot o Topical Lidocaine 4% cream applied to wound bed prior to debridement - office use only Wound #3 Left Calcaneus o Topical Lidocaine 4% cream applied to wound bed prior to debridement - office use only Wound #4 Right Calcaneus o Topical Lidocaine 4% cream applied to wound bed prior to debridement - office use only Skin Barriers/Peri-Wound Care Wound #1 Left,Dorsal Foot o Skin Prep Wound #2 Right,Dorsal Foot o Skin Prep Primary Wound Dressing Wound #1 Left,Dorsal Foot Willmann, Isaid ([id number]) o Foam Wound #2 Right,Dorsal Foot o Foam Wound #3 Left Calcaneus o Santyl Ointment Wound #4 Right Calcaneus o Santyl Ointment Secondary Dressing Wound #1 Left,Dorsal Foot o Conform/Kerlix - tape, netting Wound #2 Right,Dorsal Foot o Conform/Kerlix - tape, netting Wound #3 Left Calcaneus o ABD pad o Dry Gauze o Conform/Kerlix - tape, netting o Foam Wound #4 Right Calcaneus o ABD pad o Dry Gauze o Conform/Kerlix - tape, netting o Foam Dressing Change Frequency Wound #1 Left,Dorsal Foot o Change dressing every other day. Wound #2 Right,Dorsal Foot o Change dressing every other day. Wound #3 Left Calcaneus o Change dressing every other day. Wound #4 Right Calcaneus o Change dressing every other day. Follow-up Appointments Wound #1 Left,Dorsal Foot o Return Appointment in 2 weeks. Wound #2 Right,Dorsal Foot [name], [name] ([id number]) o Return Appointment in 2 weeks. Wound #3 Left Calcaneus o Return Appointment in 2 weeks. Wound #4 Right Calcaneus o Return Appointment in 2  weeks. Off-Loading o Heel suspension boot to: - pt to wear sage boots, please float heels o Turn and reposition every 2 hours o Other: - Advertising account planner Orders / Instructions Wound #1 Left,Dorsal Foot o Increase protein intake. Wound #2 Right,Dorsal Foot o Increase protein intake. Wound #3 Left Calcaneus o Increase protein intake. Wound #4 Right Calcaneus o Increase protein intake. Home Health Wound #1 [place] Visits -  [facility] *****Please Order [place] for patient***** o Home Health Nurse may visit PRN to address patientos wound care needs. o FACE TO FACE ENCOUNTER: MEDICARE and MEDICAID PATIENTS: I certify that this patient is under my care and that I had a face-to-face encounter that meets the physician face-to-face encounter requirements with this patient on this date. The encounter with the patient was in whole or in part for the following MEDICAL CONDITION: (primary reason for [place]) MEDICAL NECESSITY: I certify, that based on my findings, NURSING services are a medically necessary home health service. HOME BOUND STATUS: I certify that my clinical findings support that this patient is homebound (i.e., Due to illness or injury, pt requires aid of supportive devices such as crutches, cane, wheelchairs, walkers, the use of special transportation or the assistance of another person to leave their place of residence. There is a normal inability to leave the home and doing so requires considerable and taxing effort. Other absences are for medical reasons / religious services and are infrequent or of short duration when for other reasons). o If current dressing causes regression in wound condition, may D/C ordered dressing product/s and apply Normal Saline Moist Dressing daily until next [place] / Other MD appointment. [place] of regression in wound condition at  [phone]. o Please direct any NON-WOUND related issues/requests for orders to patient's Primary Care Physician Wound #2 Right,Dorsal Foot [name], [name] ([id number]) o [place] Visits - [place] *****Please Order [place] for patient***** o Home Health Nurse may visit PRN to address patientos wound care needs. o FACE TO FACE ENCOUNTER: MEDICARE and MEDICAID PATIENTS: I certify that this patient is under my care and that I had a face-to-face encounter that meets the physician face-to-face encounter requirements with this patient on this date. The encounter with the patient was in whole or in part for the following MEDICAL CONDITION: (primary reason for [place]) MEDICAL NECESSITY: I certify, that based on my findings, NURSING services are a medically necessary home health service. HOME BOUND STATUS: I certify that my clinical findings support that this patient is homebound (i.e., Due to illness or injury, pt requires aid of supportive devices such as crutches, cane, wheelchairs, walkers, the use of special transportation or the assistance of another person to leave their place of residence. There is a normal inability to leave the home and doing so requires considerable and taxing effort. Other absences are for medical reasons / religious services and are infrequent or of short duration when for other reasons). o If current dressing causes regression in wound condition, may D/C ordered dressing product/s and apply Normal Saline Moist Dressing daily until next [place] / Other MD appointment. [place] of regression in wound condition at [phone]. o Please direct any NON-WOUND related issues/requests for orders to patient's Primary Care Physician Wound #3 Left Calcaneus o [place] Visits - [place] *****Please Order [place] for patient***** o Home Health Nurse may visit PRN to address  patientos wound care needs. o FACE TO FACE ENCOUNTER: MEDICARE and MEDICAID PATIENTS: I certify that this patient is under my care and that I had a face-to-face encounter that meets the physician face-to-face encounter requirements with this patient on this date. The encounter with the patient was in whole or in part for the following MEDICAL CONDITION: (primary reason for [place]) MEDICAL NECESSITY: I certify, that based on my findings, NURSING services are  a medically necessary home health service. HOME BOUND STATUS: I certify that my clinical findings support that this patient is homebound (i.e., Due to illness or injury, pt requires aid of supportive devices such as crutches, cane, wheelchairs, walkers, the use of special transportation or the assistance of another person to leave their place of residence. There is a normal inability to leave the home and doing so requires considerable and taxing effort. Other absences are for medical reasons / religious services and are infrequent or of short duration when for other reasons). o If current dressing causes regression in wound condition, may D/C ordered dressing product/s and apply Normal Saline Moist Dressing daily until next [place] / Other MD appointment. [place] of regression in wound condition at [phone]. o Please direct any NON-WOUND related issues/requests for orders to patient's Primary Care Physician Wound #4 Right Calcaneus o [place] Visits - [place] *****Please Order [place] for patient***** o Home Health Nurse may visit PRN to address patientos wound care needs. o FACE TO FACE ENCOUNTER: MEDICARE and MEDICAID PATIENTS: I certify that this patient is under my care and that I had a face-to-face encounter that meets the physician face-to-face encounter requirements with this patient on this date. The encounter with the patient was in whole or in  part for the following MEDICAL CONDITION: (primary reason for [place]) MEDICAL NECESSITY: I certify, that based on my findings, NURSING services are a medically necessary home health service. HOME BOUND STATUS: I certify that my clinical findings [name], [name] ([id number]) support that this patient is homebound (i.e., Due to illness or injury, pt requires aid of supportive devices such as crutches, cane, wheelchairs, walkers, the use of special transportation or the assistance of another person to leave their place of residence. There is a normal inability to leave the home and doing so requires considerable and taxing effort. Other absences are for medical reasons / religious services and are infrequent or of short duration when for other reasons). o If current dressing causes regression in wound condition, may D/C ordered dressing product/s and apply Normal Saline Moist Dressing daily until next [employer] / Other MD appointment. [place] of regression in wound condition at [phone]. o Please direct any NON-WOUND related issues/requests for orders to patient's Primary Care Physician Medications-please add to medication list. Wound #1 Left,Dorsal Foot o Santyl Enzymatic Ointment o Other: - Vitamin C, Zinc, Multivitamin Wound #2 Right,Dorsal Foot o Santyl Enzymatic Ointment o Other: - Vitamin C, Zinc, Multivitamin Wound #3 Left Calcaneus o Santyl Enzymatic Ointment o Other: - Vitamin C, Zinc, Multivitamin Wound #4 Right Calcaneus o Santyl Enzymatic Ointment o Other: - Vitamin C, Zinc, Multivitamin Electronic Signature(s) Signed: 04/30/2016 4:37:54 PM By: Linton Ham MD Signed: 04/30/2016 5:56:28 PM By: Alric Quan Entered By: Alric Quan on 04/30/2016 13:24:34 [name] ([id number]) -------------------------------------------------------------------------------- Problem List Details Patient Name: [name] Date of Service: 04/30/2016 12:45 PM Medical Record Patient Account Number: [id number] Number: Treating RN: Ahmed Prima December 07, 1955 (59 y.o. 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 Other Clinician: Date of Birth/Sex: Male) Treating Laiba Fuerte Primary Care Physician: [name] Physician/Extender: G Referring Physician: Claudie Revering in Treatment: 3 Subjective Chief Complaint Information obtained from Patient The patient is here for wounds on his bilateral feet supposedly obtained during a hospitalization from 3/24 through 4/6 History of Present Illness (HPI) 04/08/16; this is a patient we really don't know too much about. He was hospitalized from 3/24 through 4/6 residing with a sodium of 167 creatinine of 2.49 right lower lobe pneumonia sepsis/septic shock syndrome. He apparently came back to the [facility] living [facility]  [facility] He apparently had been there only 2 weeks before he went out. I know very little about him premorbidly. The hospital he had a CT scan of the head that showed atrophy and chronic small vessel white matter ischemic changes and remote infarcts also noted within the bilateral basal ganglia. His echocardiogram and presentation showed a EF of 20-25%, left ventricle was moderately dilated with diffuse hypokinesis. Apparently he return to his assisted living with wounds on his bilateral feet. According to the attendant came with him he could walk before he went out he could no longer walk now. There are wounds on both heels covered with a black eschar and also wounds on his bilateral dorsal feet. ABIs calculated in our clinic for 0.49 bilaterally. He was a smoker up to 2 months ago he is not smoking now. 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 Monophasic waves distally bilaterally. He is going back at the end of the month I believe June 29 probably for an arteriogram as arranged by Dr. Delana Meyer in the meantime he has had no real changes 04/30/16; we are waiting vascular review on June 29. The black eschar over his bilateral heels is beginning to separate using Santyl. The areas on the dorsal aspect of his feet looked considerably better/resolved. He apparently has no complaints. He also has a severe presumably ischemic cardiomyopathy as noted above Objective Constitutional [name], [name] ([id number]) Sitting or standing Blood Pressure is within target range for patient.. Pulse regular and within target range for patient.Marland Kitchen Respirations regular, non-labored and within target range.. Patient's appearance is neat and clean. Appears in no acute distress. Well nourished and well developed.. Vitals  Time Taken: 1:08 PM, Height: 70 in, Pulse: 86 bpm, Respiratory Rate: 16 breaths/min, Blood Pressure: 104/69 mmHg. Respiratory Respiratory effort is easy and symmetric bilaterally. Rate is normal at rest and on room air.. Bilateral breath sounds are clear and equal in all lobes with no wheezes, rales or rhonchi.. Cardiovascular 2/6 pansystolic murmur at the lower left sternal border. JVP not elevated he appears to be euvolemic. Femoral arteries without bruits and pulses strong.. Pedal pulses absent bilaterally.. Gastrointestinal (GI) Abdomen is soft and non-distended without masses or tenderness. Bowel sounds active in all quadrants.. No liver or spleen enlargement or tenderness.. Lymphatic Nonpalpable in the popliteal or inguinal area. Psychiatric Flat affect, some degree of cognitive impairment.  General Notes: Wound exam; continued areas of thick black eschar over his bilateral heels. These are beginning to separate a bit. There is no evidence of infection Integumentary (Hair, Skin) Wound #1 status is Open. Original cause of wound was Pressure Injury. The wound is located on the Left,Dorsal Foot. The wound measures 0.1cm length x 0.1cm width x 0.1cm depth; 0cm^2 area and 0cm^3 volume. The wound is limited to skin breakdown. There is no tunneling or undermining noted. There is a small amount of serous drainage noted. The wound margin is flat and intact. There is large (67-100%) red granulation within the wound bed. There is no necrotic tissue within the wound bed. The periwound skin appearance exhibited: Localized Edema, Moist. Periwound temperature was noted as No Abnormality. The periwound has tenderness on palpation. Wound #2 status is Open. Original cause of wound was Pressure Injury. The wound is located on the Right,Dorsal Foot. The wound measures 0.1cm length x 0.1cm width x 0.1cm depth; 0.008cm^2 area and 0.001cm^3 volume. The wound is limited to skin breakdown. There is no  tunneling or undermining noted. There is a small amount of serous drainage noted. The wound margin is flat and intact. There is large (67- 100%) pink granulation within the wound bed. There is no necrotic tissue within the wound bed. The periwound skin appearance exhibited: Localized Edema, Moist. Periwound temperature was noted as No Abnormality. The periwound has tenderness on palpation. Wound #3 status is Open. Original cause of wound was Pressure Injury. The wound is located on the Left Calcaneus. The wound measures 3.2cm length x 8.5cm width x 0.1cm depth; 21.363cm^2 area and 2.136cm^3 volume. The wound is limited to skin breakdown. There is no tunneling or undermining noted. There is a none present amount of drainage noted. The wound margin is flat and intact. There is no [name], [name] ([id number]) granulation within the wound bed. There is a large (67-100%) amount of necrotic tissue within the wound bed including Eschar. The periwound skin appearance exhibited: Dry/Scaly. Periwound temperature was noted as No Abnormality. The periwound has tenderness on palpation. Wound #4 status is Open. Original cause of wound was Pressure Injury. The wound is located on the Right Calcaneus. The wound measures 3.2cm length x 5cm width x 0.1cm depth; 12.566cm^2 area and 1.257cm^3 volume. The wound is limited to skin breakdown. There is no tunneling or undermining noted. There is a none present amount of drainage noted. The wound margin is flat and intact. There is no granulation within the wound bed. There is a large (67-100%) amount of necrotic tissue within the wound bed including Eschar. The periwound skin appearance exhibited: Dry/Scaly. Periwound temperature was noted as No Abnormality. The periwound has tenderness on palpation. 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 Wound #2 Right,Dorsal Foot: Clean wound with Normal Saline. Wound #3 Left Calcaneus: Clean wound with Normal Saline. Wound #4 Right Calcaneus: Clean wound with Normal Saline. Anesthetic: Wound #1 Left,Dorsal Foot: Topical Lidocaine 4% cream applied to wound bed prior to debridement - office use only Wound #2 Right,Dorsal Foot: Topical Lidocaine 4% cream applied to wound bed prior to debridement - office use only Wound #3 Left Calcaneus: Topical Lidocaine 4%  cream applied to wound bed prior to debridement - office use only Wound #4 Right Calcaneus: Topical Lidocaine 4% cream applied to wound bed prior to debridement - office use only Agcaoili, Hensley ([id number]) Skin Barriers/Peri-Wound Care: Wound #1 Left,Dorsal Foot: Skin Prep Wound #2 Right,Dorsal Foot: Skin Prep Primary Wound Dressing: Wound #1 Left,Dorsal Foot: Foam Wound #2 Right,Dorsal Foot: Foam Wound #3 Left Calcaneus: Santyl Ointment Wound #4 Right Calcaneus: Santyl Ointment Secondary Dressing: Wound #1 Left,Dorsal Foot: Conform/Kerlix - tape, netting Wound #2 Right,Dorsal Foot: Conform/Kerlix - tape, netting Wound #3 Left Calcaneus: ABD pad Dry Gauze Conform/Kerlix - tape, netting Foam Wound #4 Right Calcaneus: ABD pad Dry Gauze Conform/Kerlix - tape, netting Foam Dressing Change Frequency: Wound #1 Left,Dorsal Foot: Change dressing every other day. Wound #2 Right,Dorsal Foot: Change dressing every other day. Wound #3 Left Calcaneus: Change dressing every other day. Wound #4 Right Calcaneus: Change dressing every other day. Follow-up Appointments: Wound #1 Left,Dorsal Foot: Return Appointment in 2 weeks. Wound #2 Right,Dorsal Foot: Return Appointment in 2  weeks. Wound #3 Left Calcaneus: Return Appointment in 2 weeks. Wound #4 Right Calcaneus: Return Appointment in 2 weeks. Off-Loading: Heel suspension boot to: - pt to wear sage boots, please float heels Turn and reposition every 2 hours Other: - [address] [place], [place] ([id number]) Additional Orders / Instructions: Wound #1 Left,Dorsal Foot: Increase protein intake. Wound #2 Right,Dorsal Foot: Increase protein intake. Wound #3 Left Calcaneus: Increase protein intake. Wound #4 Right Calcaneus: Increase protein intake. Home Health: Wound #1 Left,Dorsal Foot: Continue Home Health Visits - [place] *****Please Order [place] for patient***** Home Health Nurse may visit PRN to address patient s wound care needs. FACE TO FACE ENCOUNTER: MEDICARE and MEDICAID PATIENTS: I certify that this patient is under my care and that I had a face-to-face encounter that meets the physician face-to-face encounter requirements with this patient on this date. The encounter with the patient was in whole or in part for the following MEDICAL CONDITION: (primary reason for [place]) MEDICAL NECESSITY: I certify, that based on my findings, NURSING services are a medically necessary home health service. HOME BOUND STATUS: I certify that my clinical findings support that this patient is homebound (i.e., Due to illness or injury, pt requires aid of supportive devices such as crutches, cane, wheelchairs, walkers, the use of special transportation or the assistance of another person to leave their place of residence. There is a normal inability to leave the home and doing so requires considerable and taxing effort. Other absences are for medical reasons / religious services and are infrequent or of short duration when for other reasons). If current dressing causes regression in wound condition, may D/C ordered dressing product/s and apply Normal Saline Moist Dressing daily until next [place] / Other MD appointment. [place] of regression in wound condition at [phone]. Please direct any NON-WOUND related issues/requests for orders to patient's Primary Care Physician Wound #2 Right,Dorsal Foot: [place] Visits - [place] *****Please Order [place] for patient***** Home Health Nurse may visit PRN to address patient s wound care needs. FACE TO FACE ENCOUNTER: MEDICARE and MEDICAID PATIENTS: I certify that this patient is under my care and that I had a face-to-face encounter that meets the physician face-to-face encounter requirements with this patient on this date. The encounter with the patient was in whole or in part for the following MEDICAL CONDITION: (primary reason for [place]) MEDICAL NECESSITY: I certify, that based  on my findings, NURSING services are a medically necessary home health service. HOME BOUND STATUS: I certify that my clinical findings support that this patient is homebound (i.e., Due to illness or injury, pt requires aid of supportive devices such as crutches, cane, wheelchairs, walkers, the use of special transportation or the assistance of another person to leave their place of residence. There is a normal inability to leave the home and doing so requires considerable and taxing effort. Other absences are for medical reasons / religious services and are infrequent or of short duration when for other reasons). If current dressing causes regression in wound condition, may D/C ordered dressing product/s and apply Normal Saline Moist Dressing daily until next [place] / Other MD appointment. [place] of regression in wound condition at [phone]. Please direct any NON-WOUND related issues/requests for orders to patient's Primary Care Physician Wound #3 Left Calcaneus: [place] Visits - [place] *****Please Order [place] for patient***** Home  Health Nurse may visit PRN to address patient s wound care needs. FACE TO FACE ENCOUNTER: MEDICARE and MEDICAID PATIENTS: I certify that this patient is under my care and that I had a face-to-face encounter that meets the physician face-to-face encounter requirements with this patient on this date. The encounter with the patient was in whole or in part for the following MEDICAL CONDITION: (primary reason for [place]) MEDICAL NECESSITY: I certify, [name], [name] ([id number]) that based on my findings, NURSING services are a medically necessary home health service. HOME BOUND STATUS: I certify that my clinical findings support that this patient is homebound (i.e., Due to illness or injury, pt requires aid of supportive devices such as crutches, cane, wheelchairs, walkers, the use of special transportation or the assistance of another person to leave their place of residence. There is a normal inability to leave the home and doing so requires considerable and taxing effort. Other absences are for medical reasons / religious services and are infrequent or of short duration when for other reasons). If current dressing causes regression in wound condition, may D/C ordered dressing product/s and apply Normal Saline Moist Dressing daily until next [place] / Other MD appointment. [place] of regression in wound condition at [phone]. Please direct any NON-WOUND related issues/requests for orders to patient's Primary Care Physician Wound #4 Right Calcaneus: [place] Visits - [place] *****Please Order [place] for patient***** Home Health Nurse may visit PRN to address patient s wound care needs. FACE TO FACE ENCOUNTER: MEDICARE and MEDICAID PATIENTS: I certify that this patient is under my care and that I had a face-to-face encounter that meets the physician face-to-face encounter requirements with this patient on this date. The encounter  with the patient was in whole or in part for the following MEDICAL CONDITION: (primary reason for [place]) MEDICAL NECESSITY: I certify, that based on my findings, NURSING services are a medically necessary home health service. HOME BOUND STATUS: I certify that my clinical findings support that this patient is homebound (i.e., Due to illness or injury, pt requires aid of supportive devices such as crutches, cane, wheelchairs, walkers, the use of special transportation or the assistance of another person to leave their place of residence. There is a normal inability to leave the home and doing so requires considerable and taxing effort. Other absences are for medical reasons / religious services and are infrequent or of short duration when for other reasons). If current  dressing causes regression in wound condition, may D/C ordered dressing product/s and apply Normal Saline Moist Dressing daily until next [name] / Other MD appointment. [place] of regression in wound condition at [phone]. Please direct any NON-WOUND related issues/requests for orders to patient's Primary Care Physician Medications-please add to medication list.: Wound #1 Left,Dorsal Foot: Santyl Enzymatic Ointment Other: - Vitamin C, Zinc, Multivitamin Wound #2 Right,Dorsal Foot: Santyl Enzymatic Ointment Other: - Vitamin C, Zinc, Multivitamin Wound #3 Left Calcaneus: Santyl Enzymatic Ointment Other: - Vitamin C, Zinc, Multivitamin Wound #4 Right Calcaneus: Santyl Enzymatic Ointment Other: - Vitamin C, Zinc, Multivitamin I am awaiting vascular surgery review before determining what would be safe to do in terms of debridement of the heel eschar. We will continue with the Santyl based dressings. He has a severe ischemic cardiomyopathy but this appears at the moment to be well compensated, no adjustments to his medications [name], [name] ([id number]) are planned Electronic  Signature(s) Signed: 04/30/2016 4:37:54 PM By: Linton Ham MD Entered By: Linton Ham on 04/30/2016 14:12:56 [name], [name] ([id number]) -------------------------------------------------------------------------------- SuperBill Details Patient Name: [name] Date of Service: 04/30/2016 Medical Record Patient Account Number: [id number] Number: Treating RN: Ahmed Prima 1956/11/03 (59 y.o. 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

## 2016-05-21 ENCOUNTER — Other Ambulatory Visit: Payer: Self-pay | Admitting: Vascular Surgery

## 2016-05-21 ENCOUNTER — Encounter: Payer: Medicare Other | Attending: Nurse Practitioner | Admitting: Nurse Practitioner

## 2016-05-21 DIAGNOSIS — L89620 Pressure ulcer of left heel, unstageable: Secondary | ICD-10-CM | POA: Insufficient documentation

## 2016-05-21 DIAGNOSIS — I70245 Atherosclerosis of native arteries of left leg with ulceration of other part of foot: Secondary | ICD-10-CM | POA: Insufficient documentation

## 2016-05-21 DIAGNOSIS — I70234 Atherosclerosis of native arteries of right leg with ulceration of heel and midfoot: Secondary | ICD-10-CM | POA: Insufficient documentation

## 2016-05-21 DIAGNOSIS — L89610 Pressure ulcer of right heel, unstageable: Secondary | ICD-10-CM | POA: Insufficient documentation

## 2016-05-21 DIAGNOSIS — Z87891 Personal history of nicotine dependence: Secondary | ICD-10-CM | POA: Insufficient documentation

## 2016-05-21 DIAGNOSIS — Z8673 Personal history of transient ischemic attack (TIA), and cerebral infarction without residual deficits: Secondary | ICD-10-CM | POA: Insufficient documentation

## 2016-05-23 ENCOUNTER — Other Ambulatory Visit
Admission: RE | Admit: 2016-05-23 | Discharge: 2016-05-23 | Disposition: A | Discharge status: Home / Self Care | Payer: Medicare Other | Source: Ambulatory Visit | Attending: Vascular Surgery | Admitting: Vascular Surgery

## 2016-05-23 DIAGNOSIS — Z0189 Encounter for other specified special examinations: Secondary | ICD-10-CM | POA: Insufficient documentation

## 2016-05-23 LAB — BUN: BUN: 19 mg/dL (ref 6–20)

## 2016-05-23 LAB — CREATININE, SERUM
CREATININE: 0.99 mg/dL (ref 0.61–1.24)
GFR calc Af Amer: >60 mL/min (ref >60)

## 2016-05-23 NOTE — Progress Notes (Signed)
UTAH, WELDEN ([id number]) Visit Report for 05/21/2016 Arrival Information Details Patient Name: [name], [name] Date of Service: 05/21/2016 10:45 AM Medical Record Number: [id number] Patient Account Number: [id number] Date of Birth/Sex: 01-10-1956 (59 y.o. Male) Treating RN: Ahmed Prima Primary Care Physician: [name] Other Clinician: Referring Physician: [name] Treating Physician/Extender: Loistine Chance in Treatment: 6 Visit Information History Since Last Visit All ordered tests and consults were completed: No Patient Arrived: Wheel Chair Added or deleted any medications: No Arrival Time: 11:18 Any new allergies or adverse reactions: No Accompanied By: caregiver Had a fall or experienced change in No activities of daily living that may affect Transfer Assistance: Other risk of falls: Patient Identification Verified: Yes Signs or symptoms of abuse/neglect since last No Secondary Verification Process Yes visito Completed: Hospitalized since last visit: No Patient Requires Transmission-Based No Pain Present Now: No Precautions: Patient Has Alerts: No Electronic Signature(s) Signed: 05/21/2016 5:18:57 PM By: Alric Quan Entered By: Alric Quan on 05/21/2016 11:19:21 Lacross, [name] ([id number]) -------------------------------------------------------------------------------- Clinic Level of Care Assessment Details Patient Name: [name] Date of Service: 05/21/2016 10:45 AM Medical Record Number: [id number] Patient Account Number: [id number] Date of Birth/Sex: 1956-02-17 (60 y.o. Male) Treating RN: Ahmed Prima Primary Care Physician: [name] Other Clinician: Referring Physician: [name] Treating Physician/Extender: Loistine Chance in Treatment: 6 Clinic Level of Care Assessment Items TOOL 4 Quantity Score X - Use when only an EandM is performed on FOLLOW-UP visit 1 0 ASSESSMENTS - Nursing Assessment /  Reassessment X - Reassessment of Co-morbidities (includes updates in patient status) 1 10 X - Reassessment of Adherence to Treatment Plan 1 5 ASSESSMENTS - Wound and Skin Assessment / Reassessment []  - Simple Wound Assessment / Reassessment - one wound 0 X - Complex Wound Assessment / Reassessment - multiple wounds 3 5 []  - Dermatologic / Skin Assessment (not related to wound area) 0 ASSESSMENTS - Focused Assessment []  - Circumferential Edema Measurements - multi extremities 0 []  - Nutritional Assessment / Counseling / Intervention 0 []  - Lower Extremity Assessment (monofilament, tuning fork, pulses) 0 []  - Peripheral Arterial Disease Assessment (using hand held doppler) 0 ASSESSMENTS - Ostomy and/or Continence Assessment and Care []  - Incontinence Assessment and Management 0 []  - Ostomy Care Assessment and Management (repouching, etc.) 0 PROCESS - Coordination of Care []  - Simple Patient / Family Education for ongoing care 0 X - Complex (extensive) Patient / Family Education for ongoing care 1 20 X - Staff obtains Programmer, systems, Records, Test Results / Process Orders 1 10 X - Staff telephones HHA, Nursing Homes / Clarify orders / etc 1 10 []  - Routine Transfer to another Facility (non-emergent condition) 0 [place], [place] ([id number]) []  - Routine Hospital Admission (non-emergent condition) 0 []  - New Admissions / Biomedical engineer / Ordering NPWT, Apligraf, etc. 0 []  - Emergency Hospital Admission (emergent condition) 0 X - Simple Discharge Coordination 1 10 []  - Complex (extensive) Discharge Coordination 0 PROCESS - Special Needs []  - Pediatric / Minor Patient Management 0 []  - Isolation Patient Management 0 []  - Hearing / Language / Visual special needs 0 []  - Assessment of Community assistance (transportation, D/C planning, etc.) 0 []  - Additional assistance / Altered mentation 0 []  - Support Surface(s) Assessment (bed, cushion, seat, etc.) 0 INTERVENTIONS - Wound Cleansing /  Measurement []  - Simple Wound Cleansing - one wound 0 []  - Complex Wound Cleansing - multiple wounds 0 []  - Wound Imaging (photographs - any number of wounds) 0 []  - Wound Tracing (instead  of photographs) 0 []  - Simple Wound Measurement - one wound 0 []  - Complex Wound Measurement - multiple wounds 0 INTERVENTIONS - Wound Dressings []  - Small Wound Dressing one or multiple wounds 0 X - Medium Wound Dressing one or multiple wounds 2 15 []  - Large Wound Dressing one or multiple wounds 0 X - Application of Medications - topical 1 5 []  - Application of Medications - injection 0 INTERVENTIONS - Miscellaneous []  - External ear exam 0 Creswell, Deno ([id number]) []  - Specimen Collection (cultures, biopsies, blood, body fluids, etc.) 0 []  - Specimen(s) / Culture(s) sent or taken to Lab for analysis 0 X - Patient Transfer (multiple staff / Harrel Lemon Lift / Similar devices) 1 10 []  - Simple Staple / Suture removal (25 or less) 0 []  - Complex Staple / Suture removal (26 or more) 0 []  - Hypo / Hyperglycemic Management (close monitor of Blood Glucose) 0 []  - Ankle / Brachial Index (ABI) - do not check if billed separately 0 X - Vital Signs 1 5 Has the patient been seen at the hospital within the last three years: Yes Total Score: 130 Level Of Care: New/Established - Level 4 Electronic Signature(s) Signed: 05/21/2016 5:18:57 PM By: Alric Quan Entered By: Alric Quan on 05/21/2016 12:38:20 [name] ([id number]) -------------------------------------------------------------------------------- Encounter Discharge Information Details Patient Name: [name] Date of Service: 05/21/2016 10:45 AM Medical Record Number: [id number] Patient Account Number: [id number] Date of Birth/Sex: [birth year] (60 y.o. Male) Treating RN: Ahmed Prima Primary Care Physician: [name] Other Clinician: Referring Physician: [name] Treating Physician/Extender: Loistine Chance in  Treatment: 6 Encounter Discharge Information Items Discharge Pain Level: 0 Discharge Condition: Stable Ambulatory Status: Wheelchair Discharge Destination: Nursing Home Transportation: Private Auto Accompanied By: caregiver Schedule Follow-up Appointment: Yes Medication Reconciliation completed and provided to Patient/Care Yes Sharren Schnurr: Provided on Clinical Summary of Care: 05/21/2016 Form Type Recipient Paper Patient DG Electronic Signature(s) Signed: 05/21/2016 11:55:28 AM By: [name] Entered By: [name] on 05/21/2016 11:55:28 Alvira, Travonte ([id number]) -------------------------------------------------------------------------------- Lower Extremity Assessment Details Patient Name: [name] Date of Service: 05/21/2016 10:45 AM Medical Record Number: [id number] Patient Account Number: [id number] Date of Birth/Sex: 12-20-1955 (60 y.o. Male) Treating RN: Ahmed Prima Primary Care Physician: [name] Other Clinician: Referring Physician: [name] Treating Physician/Extender: Loistine Chance in Treatment: 6 Vascular Assessment Pulses: Posterior Tibial Dorsalis Pedis Palpable: [Left:No] [Right:No] Doppler: [Left:Monophasic] [Right:Monophasic] Extremity colors, hair growth, and conditions: Temperature of Extremity: [Left:Cool] [Right:Cool] Capillary Refill: [Left:> 3 seconds] [Right:> 3 seconds] Electronic Signature(s) Signed: 05/21/2016 5:18:57 PM By: Alric Quan Entered By: Alric Quan on 05/21/2016 11:22:08 Giebel, [name] ([id number]) -------------------------------------------------------------------------------- Multi Wound Chart Details Patient Name: [name] Date of Service: 05/21/2016 10:45 AM Medical Record Number: [id number] Patient Account Number: [id number] Date of Birth/Sex: Mar 29, 1956 (60 y.o. Male) Treating RN: Ahmed Prima Primary Care Physician: [name] Other Clinician: Referring Physician:  [name] Treating Physician/Extender: Loistine Chance in Treatment: 6 Vital Signs Height(in): 70 Pulse(bpm): 63 Weight(lbs): Blood Pressure 125/75 (mmHg): Body Mass Index(BMI): Temperature(F): Respiratory Rate 16 (breaths/min): Photos: [1:No Photos] [2:No Photos] [3:No Photos] Wound Location: [1:Left, Dorsal Foot] [2:Right Foot - Dorsal] [3:Left Calcaneus] Wounding Event: [1:Pressure Injury] [2:Pressure Injury] [3:Pressure Injury] Primary Etiology: [1:Pressure Ulcer] [2:Pressure Ulcer] [3:Pressure Ulcer] Comorbid History: [1:N/A] [2:Anemia] [3:Anemia] Date Acquired: [1:02/07/2016] [2:02/07/2016] [3:02/07/2016] Weeks of Treatment: [1:6] [2:6] [3:6] Wound Status: [1:Healed - Epithelialized] [2:Open] [3:Open] Measurements L x W x D 0x0x0 [2:0.5x0.7x0.1] [3:2x5x0.1] (cm) Area (cm) : [1:0] [2:0.275] [3:7.854] Volume (cm) : [1:0] [2:0.027] [3:0.785] %  Reduction in Area: [1:100.00%] [2:-1618.70%] [3:63.20%] % Reduction in Volume: 100.00% [2:-1250.00%] [3:63.20%] Classification: [1:Category/Stage II] [2:Category/Stage II] [3:Category/Stage II] Exudate Amount: [1:N/A] [2:Medium] [3:None Present] Exudate Type: [1:N/A] [2:Serosanguineous] [3:N/A] Exudate Color: [1:N/A] [2:red, brown] [3:N/A] Wound Margin: [1:N/A] [2:Flat and Intact] [3:Flat and Intact] Granulation Amount: [1:N/A] [2:Small (1-33%)] [3:None Present (0%)] Granulation Quality: [1:N/A] [2:Pink] [3:N/A] Necrotic Amount: [1:N/A] [2:Large (67-100%)] [3:Large (67-100%)] Necrotic Tissue: [1:N/A] [2:Eschar] [3:Eschar] Epithelialization: [1:N/A] [2:None] [3:None] Periwound Skin Texture: No Abnormalities Noted [2:Edema: Yes] [3:No Abnormalities Noted] Periwound Skin [1:No Abnormalities Noted] [2:Moist: Yes] [3:Dry/Scaly: Yes] Moisture: Periwound Skin Color: No Abnormalities Noted [2:No Abnormalities Noted] [3:No Abnormalities Noted] Temperature: [1:N/A] [2:No Abnormality] [3:No Abnormality] Tenderness on No Yes  Yes Palpation: Wound Preparation: N/A Ulcer Cleansing: Ulcer Cleansing: Rinsed/Irrigated with Rinsed/Irrigated with Saline Saline Topical Anesthetic Topical Anesthetic Applied: Other: lidocaine Applied: Other: lidocaine 4% 4% Wound Number: 4 N/A N/A Photos: No Photos N/A N/A Wound Location: Right Calcaneus N/A N/A Wounding Event: Pressure Injury N/A N/A Primary Etiology: Pressure Ulcer N/A N/A Comorbid History: Anemia N/A N/A Date Acquired: 02/07/2016 N/A N/A Weeks of Treatment: 6 N/A N/A Wound Status: Open N/A N/A Measurements L x W x D 2.5x7.4x0.1 N/A N/A (cm) Area (cm) : 14.53 N/A N/A Volume (cm) : 1.453 N/A N/A % Reduction in Area: -15.60% N/A N/A % Reduction in Volume: -15.60% N/A N/A Classification: Category/Stage II N/A N/A Exudate Amount: None Present N/A N/A Exudate Type: N/A N/A N/A Exudate Color: N/A N/A N/A Wound Margin: Flat and Intact N/A N/A Granulation Amount: None Present (0%) N/A N/A Granulation Quality: N/A N/A N/A Necrotic Amount: Large (67-100%) N/A N/A Necrotic Tissue: Eschar N/A N/A Exposed Structures: Fascia: No N/A N/A Fat: No Tendon: No Muscle: No Joint: No Bone: No Limited to Skin Breakdown Epithelialization: None N/A N/A Periwound Skin Texture: No Abnormalities Noted N/A N/A Periwound Skin Dry/Scaly: Yes N/A N/A Moisture: Periwound Skin Color: No Abnormalities Noted N/A N/A Temperature: No Abnormality N/A N/A Yes N/A N/A Abundis, Duron ([id number]) Tenderness on Palpation: Wound Preparation: Ulcer Cleansing: N/A N/A Rinsed/Irrigated with Saline Topical Anesthetic Applied: Other: lidocaine 4% Treatment Notes Electronic Signature(s) Signed: 05/21/2016 5:18:57 PM By: Alric Quan Entered By: Alric Quan on 05/21/2016 11:33:55 Mannella, Ryot ([id number]) -------------------------------------------------------------------------------- Multi-Disciplinary Care Plan Details Patient Name: [name] Date of Service:  05/21/2016 10:45 AM Medical Record Number: [id number] Patient Account Number: [id number] Date of Birth/Sex: [birth year] (60 y.o. Male) Treating RN: Ahmed Prima Primary Care Physician: [name] Other Clinician: Referring Physician: [name] Treating Physician/Extender: Loistine Chance in Treatment: 6 Active Inactive Abuse / Safety / Falls / Self Care Management Nursing Diagnoses: Potential for falls Goals: Patient will remain injury free Date Initiated: 04/08/2016 Goal Status: Active Interventions: Assess fall risk on admission and as needed Notes: Nutrition Nursing Diagnoses: Imbalanced nutrition Goals: Patient/caregiver agrees to and verbalizes understanding of need to use nutritional supplements and/or vitamins as prescribed Date Initiated: 04/08/2016 Goal Status: Active Interventions: Assess patient nutrition upon admission and as needed per policy Notes: Orientation to the Wound Care Program Nursing Diagnoses: Knowledge deficit related to the wound healing center program Goals: Patient/caregiver will verbalize understanding of the [place] ([id number]) Date Initiated: 04/08/2016 Goal Status: Active Interventions: Provide education on orientation to the wound center Notes: Pain, Acute or Chronic Nursing Diagnoses: Pain, acute or chronic: actual or potential Potential alteration in comfort, pain Goals: Patient will verbalize adequate pain control and receive pain control interventions during procedures as needed Date Initiated: 04/08/2016 Goal Status: Active Interventions: Assess  comfort goal upon admission Complete pain assessment as per visit requirements Notes: Pressure Nursing Diagnoses: Knowledge deficit related to causes and risk factors for pressure ulcer development Knowledge deficit related to management of pressures ulcers Goals: Patient will remain free from development of additional pressure  ulcers Date Initiated: 04/08/2016 Goal Status: Active Interventions: Assess offloading mechanisms upon admission and as needed Assess potential for pressure ulcer upon admission and as needed Notes: Wound/Skin Impairment Nursing Diagnoses: Impaired tissue integrity Barno, Domonique ([id number]) Goals: Ulcer/skin breakdown will have a volume reduction of 30% by week 4 Date Initiated: 04/08/2016 Goal Status: Active Ulcer/skin breakdown will have a volume reduction of 50% by week 8 Date Initiated: 04/08/2016 Goal Status: Active Ulcer/skin breakdown will have a volume reduction of 80% by week 12 Date Initiated: 04/08/2016 Goal Status: Active Interventions: Assess ulceration(s) every visit Notes: Electronic Signature(s) Signed: 05/21/2016 5:18:57 PM By: Alric Quan Entered By: Alric Quan on 05/21/2016 11:33:49 [place], [place]on ([id number]) -------------------------------------------------------------------------------- Pain Assessment Details Patient Name: [name] Date of Service: 05/21/2016 10:45 AM Medical Record Number: [id number] Patient Account Number: [id number] Date of Birth/Sex: Sep 11, 1956 (60 y.o. Male) Treating RN: Ahmed Prima Primary Care Physician: [name] Other Clinician: Referring Physician: [name] Treating Physician/Extender: Loistine Chance in Treatment: 6 Active Problems Location of Pain Severity and Description of Pain Patient Has Paino No Site Locations With Dressing Change: No Pain Management and Medication Current Pain Management: Notes Topical or injectable lidocaine is offered to patient for acute pain when surgical debridement is performed. If needed, Patient is instructed to use over the counter pain medication for the following 24-48 hours after debridement. Wound care MDs do not prescribed pain medications. Electronic Signature(s) Signed: 05/21/2016 5:18:57 PM By: Alric Quan Entered By: Alric Quan on  05/21/2016 11:19:29 [name] ([id number]) -------------------------------------------------------------------------------- Patient/Caregiver Education Details Patient Name: [name] Date of Service: 05/21/2016 10:45 AM Medical Record Number: [id number] Patient Account Number: [id number] Date of Birth/Gender: [birth year] (60 y.o. Male) Treating RN: Ahmed Prima Primary Care Physician: [name] Other Clinician: Referring Physician: [name] Treating Physician/Extender: Loistine Chance in Treatment: 6 Education Assessment Education Provided To: Patient and Caregiver Education Topics Provided Wound/Skin Impairment: Handouts: Other: change dressing as ordered Methods: Demonstration, Explain/Verbal Responses: State content correctly Electronic Signature(s) Signed: 05/21/2016 5:18:57 PM By: Alric Quan Entered By: Alric Quan on 05/21/2016 11:54:45 Calixto, Teren ([id number]) -------------------------------------------------------------------------------- Wound Assessment Details Patient Name: [name] Date of Service: 05/21/2016 10:45 AM Medical Record Number: [id number] Patient Account Number: [id number] Date of Birth/Sex: July 21, 1956 (60 y.o. Male) Treating RN: Ahmed Prima Primary Care Physician: [name] Other Clinician: Referring Physician: [name] Treating Physician/Extender: Loistine Chance in Treatment: 6 Wound Status Wound Number: 1 Primary Etiology: Pressure Ulcer Wound Location: Left, Dorsal Foot Wound Status: Healed - Epithelialized Wounding Event: Pressure Injury Date Acquired: 02/07/2016 Weeks Of Treatment: 6 Clustered Wound: No Photos Photo Uploaded By: Alric Quan on 05/21/2016 12:43:45 Wound Measurements Length: (cm) 0 % Reduction i Width: (cm) 0 % Reduction i Depth: (cm) 0 Area: (cm) 0 Volume: (cm) 0 n Area: 100% n Volume: 100% Wound Description Classification: Category/Stage  II Periwound Skin Texture Texture Color No Abnormalities Noted: No No Abnormalities Noted: No Moisture No Abnormalities Noted: No Electronic Signature(s) Signed: 05/21/2016 5:18:57 PM By: Aldine Contes, Gerard ([id number]) Entered By: Alric Quan on 05/21/2016 11:29:22 [name] ([id number]) -------------------------------------------------------------------------------- Wound Assessment Details Patient Name: [name] Date of Service: 05/21/2016 10:45 AM Medical Record Number: [id number] Patient Account Number: [id number] Date of Birth/Sex: 05/12/1956 (59 y.o.  Male) Treating RN: Carolyne Fiscal, Debi Primary Care Physician: [name] Other Clinician: Referring Physician: [name] Treating Physician/Extender: Loistine Chance in Treatment: 6 Wound Status Wound Number: 2 Primary Etiology: Pressure Ulcer Wound Location: Right Foot - Dorsal Wound Status: Open Wounding Event: Pressure Injury Comorbid History: Anemia Date Acquired: 02/07/2016 Weeks Of Treatment: 6 Clustered Wound: No Photos Photo Uploaded By: Alric Quan on 05/21/2016 12:43:46 Wound Measurements Length: (cm) 0.5 Width: (cm) 0.7 Depth: (cm) 0.1 Area: (cm) 0.275 Volume: (cm) 0.027 % Reduction in Area: -1618.7% % Reduction in Volume: -1250% Epithelialization: None Tunneling: No Undermining: No Wound Description Classification: Category/Stage II Wound Margin: Flat and Intact Exudate Amount: Medium Exudate Type: Serosanguineous Exudate Color: red, brown Foul Odor After Cleansing: No Wound Bed Granulation Amount: Small (1-33%) Exposed Structure Granulation Quality: Pink Fascia Exposed: No Necrotic Amount: Large (67-100%) Fat Layer Exposed: No Necrotic Quality: Eschar Tendon Exposed: No Stutsman, Keiden ([id number]) Muscle Exposed: No Joint Exposed: No Bone Exposed: No Limited to Skin Breakdown Periwound Skin Texture Texture Color No Abnormalities Noted: No No  Abnormalities Noted: No Localized Edema: Yes Temperature / Pain Moisture Temperature: No Abnormality No Abnormalities Noted: No Tenderness on Palpation: Yes Moist: Yes Wound Preparation Ulcer Cleansing: Rinsed/Irrigated with Saline Topical Anesthetic Applied: Other: lidocaine 4%, Treatment Notes Wound #2 (Right, Dorsal Foot) 1. Cleansed with: Clean wound with Normal Saline 2. Anesthetic Topical Lidocaine 4% cream to wound bed prior to debridement 4. Dressing Applied: Foam 5. Secondary Dressing Applied Kerlix/Conform 7. Secured with Tape Notes netting Electronic Signature(s) Signed: 05/21/2016 5:18:57 PM By: Alric Quan Entered By: Alric Quan on 05/21/2016 11:30:11 [name] ([id number]) -------------------------------------------------------------------------------- Wound Assessment Details Patient Name: [name] Date of Service: 05/21/2016 10:45 AM Medical Record Number: [id number] Patient Account Number: [id number] Date of Birth/Sex: March 02, 1956 (60 y.o. Male) Treating RN: Ahmed Prima Primary Care Physician: [name] Other Clinician: Referring Physician: [name] Treating Physician/Extender: Loistine Chance in Treatment: 6 Wound Status Wound Number: 3 Primary Etiology: Pressure Ulcer Wound Location: Left Calcaneus Wound Status: Open Wounding Event: Pressure Injury Comorbid History: Anemia Date Acquired: 02/07/2016 Weeks Of Treatment: 6 Clustered Wound: No Photos Photo Uploaded By: Alric Quan on 05/21/2016 12:44:45 Wound Measurements Length: (cm) 2 Width: (cm) 5 Depth: (cm) 0.1 Area: (cm) 7.854 Volume: (cm) 0.785 % Reduction in Area: 63.2% % Reduction in Volume: 63.2% Epithelialization: None Tunneling: No Undermining: No Wound Description Classification: Category/Stage II Wound Margin: Flat and Intact Exudate Amount: None Present Foul Odor After Cleansing: No Wound Bed Granulation Amount: None  Present (0%) Exposed Structure Necrotic Amount: Large (67-100%) Fascia Exposed: No Necrotic Quality: Eschar Fat Layer Exposed: No Tendon Exposed: No Muscle Exposed: No Joint Exposed: No Hisaw, Kemoni ([id number]) Bone Exposed: No Limited to Skin Breakdown Periwound Skin Texture Texture Color No Abnormalities Noted: No No Abnormalities Noted: No Moisture Temperature / Pain No Abnormalities Noted: No Temperature: No Abnormality Dry / Scaly: Yes Tenderness on Palpation: Yes Wound Preparation Ulcer Cleansing: Rinsed/Irrigated with Saline Topical Anesthetic Applied: Other: lidocaine 4%, Treatment Notes Wound #3 (Left Calcaneus) 1. Cleansed with: Clean wound with Normal Saline 2. Anesthetic Topical Lidocaine 4% cream to wound bed prior to debridement 4. Dressing Applied: Santyl Ointment 5. Secondary Dressing Applied Dry Gauze Foam Kerlix/Conform 7. Secured with Tape Notes netting, allevyn heel cups Electronic Signature(s) Signed: 05/21/2016 5:18:57 PM By: Alric Quan Entered By: Alric Quan on 05/21/2016 11:31:19 Lombard, Chan ([id number]) -------------------------------------------------------------------------------- Wound Assessment Details Patient Name: [name] Date of Service: 05/21/2016 10:45 AM Medical Record Number: [id number] Patient Account Number:  [id number] Date of Birth/Sex: [birth year] (60 y.o. Male) Treating RN: Carolyne Fiscal, Debi Primary Care Physician: [name] Other Clinician: Referring Physician: [name] Treating Physician/Extender: Loistine Chance in Treatment: 6 Wound Status Wound Number: 4 Primary Etiology: Pressure Ulcer Wound Location: Right Calcaneus Wound Status: Open Wounding Event: Pressure Injury Comorbid History: Anemia Date Acquired: 02/07/2016 Weeks Of Treatment: 6 Clustered Wound: No Photos Photo Uploaded By: Alric Quan on 05/21/2016 12:44:45 Wound Measurements Length: (cm) 2.5 Width:  (cm) 7.4 Depth: (cm) 0.1 Area: (cm) 14.53 Volume: (cm) 1.453 % Reduction in Area: -15.6% % Reduction in Volume: -15.6% Epithelialization: None Tunneling: No Undermining: No Wound Description Classification: Category/Stage II Wound Margin: Flat and Intact Exudate Amount: None Present Foul Odor After Cleansing: No Wound Bed Granulation Amount: None Present (0%) Exposed Structure Necrotic Amount: Large (67-100%) Fascia Exposed: No Necrotic Quality: Eschar Fat Layer Exposed: No Tendon Exposed: No Muscle Exposed: No Joint Exposed: No Gorr, Hazaiah ([id number]) Bone Exposed: No Limited to Skin Breakdown Periwound Skin Texture Texture Color No Abnormalities Noted: No No Abnormalities Noted: No Moisture Temperature / Pain No Abnormalities Noted: No Temperature: No Abnormality Dry / Scaly: Yes Tenderness on Palpation: Yes Wound Preparation Ulcer Cleansing: Rinsed/Irrigated with Saline Topical Anesthetic Applied: Other: lidocaine 4%, Treatment Notes Wound #4 (Right Calcaneus) 1. Cleansed with: Clean wound with Normal Saline 2. Anesthetic Topical Lidocaine 4% cream to wound bed prior to debridement 4. Dressing Applied: Santyl Ointment 5. Secondary Dressing Applied Dry Gauze Foam Kerlix/Conform 7. Secured with Tape Notes netting, allevyn heel cups Electronic Signature(s) Signed: 05/21/2016 5:18:57 PM By: Alric Quan Entered By: Alric Quan on 05/21/2016 11:32:33 [name] ([id number]) -------------------------------------------------------------------------------- Vitals Details Patient Name: [name] Date of Service: 05/21/2016 10:45 AM Medical Record Number: [id number] Patient Account Number: [id number] Date of Birth/Sex: [birth year] (60 y.o. Male) Treating RN: Ahmed Prima Primary Care Physician: [name] Other Clinician: Referring Physician: [name] Treating Physician/Extender: Loistine Chance in Treatment:  6 Vital Signs Time Taken: 11:19 Pulse (bpm): 63 Height (in): 70 Respiratory Rate (breaths/min): 16 Blood Pressure (mmHg): 125/75 Reference Range: 80 - 120 mg / dl Electronic Signature(s) Signed: 05/21/2016 5:18:57 PM By: Alric Quan Entered By: Alric Quan on 05/21/2016 11:21:37

## 2016-05-23 NOTE — Progress Notes (Signed)
VIVEK, ORAHOOD ([id number]) Visit Report for 05/21/2016 Chief Complaint Document Details Patient Name: [name], [name] Date of Service: 05/21/2016 10:45 AM Medical Record Number: [id number] Patient Account Number: [id number] Date of Birth/Sex: 03/18/1956 (60 y.o. Male) Treating RN: Ahmed Prima Primary Care Physician: [name] Other Clinician: Referring Physician: [name] Treating Physician/Extender: Loistine Chance in Treatment: 6 Information Obtained from: Patient Chief Complaint The patient is here for wounds on his bilateral feet supposedly obtained during a hospitalization from 3/24 through 4/6 Electronic Signature(s) Signed: 05/22/2016 5:22:48 PM By: Londell Moh FNP Entered By: Londell Moh on 05/21/2016 12:47:21 Ovando, [name] ([id number]) -------------------------------------------------------------------------------- HPI Details Patient Name: [name] Date of Service: 05/21/2016 10:45 AM Medical Record Number: [id number] Patient Account Number: [id number] Date of Birth/Sex: Jan 30, 1956 (60 y.o. Male) Treating RN: Ahmed Prima Primary Care Physician: [name] Other Clinician: Referring Physician: [name] Treating Physician/Extender: Loistine Chance in Treatment: 6 History of Present Illness HPI Description: 04/08/16; this is a patient we really don't know too much about. He was hospitalized from 3/24 through 4/6 residing with a sodium of 167 creatinine of 2.49 right lower lobe pneumonia sepsis/septic shock syndrome. He apparently came back to [facility] He apparently had been there only 2 weeks before he went out. I know very little about him premorbidly. The hospital he had a CT scan of the head that showed atrophy and chronic small vessel white matter ischemic changes and remote infarcts also noted within the bilateral basal ganglia. His echocardiogram and  presentation showed a EF of 20-25%, left ventricle was moderately dilated with diffuse hypokinesis. Apparently he return to his assisted living with wounds on his bilateral feet. According to the attendant came with him he could walk before he went out he could no longer walk now. There are wounds on both heels covered with a black eschar and also wounds on his bilateral dorsal feet. ABIs calculated in our clinic for 0.49 bilaterally. He was a smoker up to 2 months ago he is not smoking now. 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 Monophasic waves distally bilaterally. He is going back at the end of the month I believe June 29 probably for an arteriogram as arranged by Dr. Delana Meyer in the meantime he has had no real changes 04/30/16; we are waiting vascular review on June 29. The black eschar over his bilateral heels is beginning to separate using Santyl. The areas on the dorsal aspect of his feet looked considerably better/resolved. He apparently has no complaints. He also has a severe presumably ischemic cardiomyopathy as noted above 05/21/16:pt returns today for ongoing evaluation of wounds. I reviewed recent venous and arterial diagnostic findings. He is scheduled for a LLE angio on 05/26/16 and RLE angio on 06/02/16 for advanced evaluation of PAD as noted per lower extremity arterial duplex. BLE venous duplex was negative for venous reflux, DVT or insufficiency. Electronic Signature(s) Signed: 05/22/2016 5:22:48 PM By: Londell Moh FNP Entered By: Londell Moh on 05/21/2016 12:51:39 [name] ([id number]) -------------------------------------------------------------------------------- Physical Exam Details Patient Name: [name] Date of Service: 05/21/2016 10:45 AM Medical Record Number: [id number] Patient  Account Number: [id number] Date of Birth/Sex: 10-25-1956 (60 y.o. Male) Treating RN: Ahmed Prima Primary Care Physician: [name] Other Clinician: Referring Physician: [name] Treating Physician/Extender: Loistine Chance in Treatment:  6 Constitutional Thin. chronically ill appearing.. Ears, Nose, Mouth, and Throat Patient can hear normal speaking tones without difficulty.Marland Kitchen Respiratory Respiratory effort is easy and symmetric bilaterally. Rate is normal at rest and on room air.. Cardiovascular Pedal pulses absent bilaterally.. Integumentary (Hair, Skin) all wounds without s/s of infection.Marland Kitchen Psychiatric No evidence of depression, anxiety, or agitation. Calm, cooperative, and communicative. Appropriate interactions and affect.. Electronic Signature(s) Signed: 05/22/2016 5:22:48 PM By: Londell Moh FNP Entered By: Londell Moh on 05/21/2016 12:52:30 [name] ([id number]) -------------------------------------------------------------------------------- Physician Orders Details Patient Name: [name] Date of Service: 05/21/2016 10:45 AM Medical Record Number: [id number] Patient Account Number: [id number] Date of Birth/Sex: July 02, 1956 (60 y.o. Male) Treating RN: Ahmed Prima Primary Care Physician: [name] Other Clinician: Referring Physician: [name] Treating Physician/Extender: Loistine Chance in Treatment: 6 Verbal / Phone Orders: Yes Clinician: Carolyne Fiscal, Debi Read Back and Verified: Yes Diagnosis Coding Wound Cleansing Wound #2 Right,Dorsal Foot o Clean wound with Normal Saline. Wound #3 Left Calcaneus o Clean wound with Normal Saline. Wound #4 Right Calcaneus o Clean wound with Normal Saline. Anesthetic Wound #2 Right,Dorsal Foot o Topical Lidocaine 4% cream applied to wound bed prior to debridement - office use only Wound #3 Left Calcaneus o Topical Lidocaine 4% cream applied to wound bed prior  to debridement - office use only Wound #4 Right Calcaneus o Topical Lidocaine 4% cream applied to wound bed prior to debridement - office use only Primary Wound Dressing Wound #2 Right,Dorsal Foot o Foam Wound #3 Left Calcaneus o Santyl Ointment Wound #4 Right Calcaneus o Santyl Ointment Secondary Dressing Wound #2 Right,Dorsal Foot o Conform/Kerlix - tape, netting o Foam Wound #3 Left Calcaneus o Dry Gauze Dorce, [place] ([id number]) o Conform/Kerlix - tape, netting o Foam - Allevyn heel cups Wound #4 Right Calcaneus o Dry Gauze o Conform/Kerlix - tape, netting o Foam - Allevyn heel cups Dressing Change Frequency Wound #2 Right,Dorsal Foot o Change dressing every other day. Wound #3 Left Calcaneus o Change dressing every other day. Wound #4 Right Calcaneus o Change dressing every other day. Follow-up Appointments Wound #2 Right,Dorsal Foot o Return Appointment in 2 weeks. Wound #3 Left Calcaneus o Return Appointment in 2 weeks. Wound #4 Right Calcaneus o Return Appointment in 2 weeks. Off-Loading o Heel suspension boot to: - pt to wear sage boots, please float heels o Turn and reposition every 2 hours o Other: - Advertising account planner Orders / Instructions Wound #2 Right,Dorsal Foot o Increase protein intake. Wound #3 Left Calcaneus o Increase protein intake. Wound #4 Right Calcaneus o Increase protein intake. Home Health Wound #2 [place] Visits - [place] *****Please Order [place] for patient***** o Home Health Nurse may visit PRN to address patientos wound care needs. NAZIRE, CHISMAN ([id number]) o FACE TO FACE ENCOUNTER: MEDICARE and MEDICAID PATIENTS: I certify that this patient is under my care and that I had a face-to-face encounter that meets the physician face-to-face encounter requirements with this patient on this date. The encounter with the patient was  in whole or in part for the following MEDICAL CONDITION: (primary reason for [place]) MEDICAL NECESSITY: I certify, that based on my findings, NURSING services are a medically necessary home health service. HOME BOUND STATUS: I certify that my clinical findings support that this patient is homebound (i.e., Due to illness or injury, pt requires aid of supportive devices such as crutches, cane, wheelchairs, walkers, the use of special transportation or the assistance of another  person to leave their place of residence. There is a normal inability to leave the home and doing so requires considerable and taxing effort. Other absences are for medical reasons / religious services and are infrequent or of short duration when for other reasons). o If current dressing causes regression in wound condition, may D/C ordered dressing product/s and apply Normal Saline Moist Dressing daily until next [place] / Other MD appointment. [place] of regression in wound condition at [phone]. o Please direct any NON-WOUND related issues/requests for orders to patient's Primary Care Physician Wound #3 Left Calcaneus o [place] Visits - [place] *****Please Order [place] for patient***** o Home Health Nurse may visit PRN to address patientos wound care needs. o FACE TO FACE ENCOUNTER: MEDICARE and MEDICAID PATIENTS: I certify that this patient is under my care and that I had a face-to-face encounter that meets the physician face-to-face encounter requirements with this patient on this date. The encounter with the patient was in whole or in part for the following MEDICAL CONDITION: (primary reason for [place]) MEDICAL NECESSITY: I certify, that based on my findings, NURSING services are a medically necessary home health service. HOME BOUND STATUS: I certify that my clinical findings support that this patient is homebound (i.e., Due  to illness or injury, pt requires aid of supportive devices such as crutches, cane, wheelchairs, walkers, the use of special transportation or the assistance of another person to leave their place of residence. There is a normal inability to leave the home and doing so requires considerable and taxing effort. Other absences are for medical reasons / religious services and are infrequent or of short duration when for other reasons). o If current dressing causes regression in wound condition, may D/C ordered dressing product/s and apply Normal Saline Moist Dressing daily until next [place] / Other MD appointment. [place] of regression in wound condition at [phone]. o Please direct any NON-WOUND related issues/requests for orders to patient's Primary Care Physician Wound #4 Right Calcaneus o [place] Visits - [place] *****Please Order [place] for patient***** o Home Health Nurse may visit PRN to address patientos wound care needs. o FACE TO FACE ENCOUNTER: MEDICARE and MEDICAID PATIENTS: I certify that this patient is under my care and that I had a face-to-face encounter that meets the physician face-to-face encounter requirements with this patient on this date. The encounter with the patient was in whole or in part for the following MEDICAL CONDITION: (primary reason for [place]) MEDICAL NECESSITY: I certify, that based on my findings, NURSING services are a medically necessary home health service. HOME BOUND STATUS: I certify that my clinical findings support that this patient is homebound (i.e., Due to illness or injury, pt requires aid of supportive devices such as crutches, cane, wheelchairs, walkers, the use of special [place], Dujuan ([id number]) transportation or the assistance of another person to leave their place of residence. There is a normal inability to leave the home and doing so requires considerable  and taxing effort. Other absences are for medical reasons / religious services and are infrequent or of short duration when for other reasons). o If current dressing causes regression in wound condition, may D/C ordered dressing product/s and apply Normal Saline Moist Dressing daily until next [place] / Other MD appointment. [place] of regression in wound condition at [phone]. o Please direct any NON-WOUND related issues/requests for  orders to patient's Primary Care Physician Medications-please add to medication list. Wound #2 Right,Dorsal Foot o Santyl Enzymatic Ointment o Other: - Vitamin C, Zinc, Multivitamin Wound #3 Left Calcaneus o Santyl Enzymatic Ointment o Other: - Vitamin C, Zinc, Multivitamin Wound #4 Right Calcaneus o Santyl Enzymatic Ointment o Other: - Vitamin C, Zinc, Multivitamin Electronic Signature(s) Signed: 05/21/2016 5:18:57 PM By: Alric Quan Signed: 05/22/2016 5:22:48 PM By: Londell Moh FNP Entered By: Alric Quan on 05/21/2016 11:51:42 Wilmore, Tate ([id number]) -------------------------------------------------------------------------------- Problem List Details Patient Name: [name] Date of Service: 05/21/2016 10:45 AM Medical Record Number: [id number] Patient Account Number: [id number] Date of Birth/Sex: 15-Jul-1956 (60 y.o. 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 Male) Treating RN: Ahmed Prima Primary Care Physician: [name] Other Clinician: Referring Physician: [name] Treating Physician/Extender: Loistine Chance in Treatment: 6 Subjective Chief Complaint Information obtained from Patient The patient is here for wounds on his bilateral feet supposedly obtained during a hospitalization from 3/24 through 4/6 History of Present Illness (HPI) 04/08/16; this is a patient we really don't know too much about. He was hospitalized from 3/24 through 4/6 residing with a sodium of 167 creatinine of 2.49 right lower lobe pneumonia sepsis/septic shock syndrome. He apparently came back to [facility] He apparently had been there only 2 weeks before he went out. I know very little about him premorbidly. The hospital he had a CT scan of the head that showed atrophy and chronic small vessel white matter ischemic changes and remote infarcts also noted within the bilateral basal ganglia. His echocardiogram and presentation showed a EF of 20-25%, left ventricle was moderately dilated with diffuse hypokinesis. Apparently he return to his assisted living with wounds on his bilateral feet. According to the attendant came with him he could walk before he went out he could no longer walk now. There are wounds on both heels covered with a black eschar and also wounds on his  bilateral dorsal feet. ABIs calculated in our clinic for 0.49 bilaterally. He was a smoker up to 2 months ago he is not smoking now. 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 Monophasic waves distally bilaterally. He is going back at the end of the month I believe June 29 probably for an arteriogram as arranged by Dr. Delana Meyer in the meantime he has had no real changes 04/30/16; we are waiting vascular review on June 29. The black eschar over his bilateral heels is beginning to separate using Santyl. The areas on the dorsal aspect of his feet looked considerably  better/resolved. He apparently has no complaints. He also has a severe presumably ischemic cardiomyopathy as noted above 05/21/16:pt returns today for ongoing evaluation of wounds. I reviewed recent venous and arterial diagnostic findings. He is scheduled for a LLE angio on 05/26/16 and RLE angio on 06/02/16 for advanced evaluation of PAD as noted per lower extremity arterial duplex. BLE venous duplex was negative for venous reflux, DVT or insufficiency. no reported fever, chills, body aches or malaise. Reichart, Linc ([id number]) Objective Constitutional Thin. chronically ill appearing.. Vitals Time Taken: 11:19 AM, Height: 70 in, Pulse: 63 bpm, Respiratory Rate: 16 breaths/min, Blood Pressure: 125/75 mmHg. Ears, Nose, Mouth, and Throat Patient can hear normal speaking tones without difficulty.Marland Kitchen Respiratory Respiratory effort is easy and symmetric bilaterally. Rate is normal at rest and on room air.. Cardiovascular Pedal pulses absent bilaterally.Marland Kitchen Psychiatric No evidence of depression, anxiety, or agitation. Calm, cooperative, and communicative. Appropriate interactions and affect.. Integumentary (Hair, Skin) all wounds without s/s of infection.. Wound #1 status  is Healed - Epithelialized. Original cause of wound was Pressure Injury. The wound is located on the Left,Dorsal Foot. The wound measures 0cm length x 0cm width x 0cm depth; 0cm^2 area and 0cm^3 volume. Wound #2 status is Open. Original cause of wound was Pressure Injury. The wound is located on the Right,Dorsal Foot. The wound measures 0.5cm length x 0.7cm width x 0.1cm depth; 0.275cm^2 area and 0.027cm^3 volume. The wound is limited to skin breakdown. There is no tunneling or undermining noted. There is a medium amount of serosanguineous drainage noted. The wound margin is flat and intact. There is small (1-33%) pink granulation within the wound bed. There is a large (67-100%) amount of necrotic tissue within the wound bed including Eschar. The periwound skin appearance exhibited: Localized Edema, Moist. Periwound temperature was noted as No Abnormality. The periwound has tenderness on palpation. Wound #3 status is Open. Original cause of wound was Pressure Injury. The wound is located on the Left Calcaneus. The wound measures 2cm length x 5cm width x 0.1cm depth; 7.854cm^2 area and 0.785cm^3 volume. The wound is limited to skin breakdown. There is no tunneling or undermining noted. There is a none present amount of drainage noted. The wound margin is flat and intact. There is no granulation within the wound bed. There is a large (67-100%) amount of necrotic tissue within the wound bed including Eschar. The periwound skin appearance exhibited: Dry/Scaly. Periwound temperature was noted as No Abnormality. The periwound has tenderness on palpation. Wound #4 status is Open. Original cause of wound was Pressure Injury. The wound is located on the Right Calcaneus. The wound measures 2.5cm length x 7.4cm width x 0.1cm depth; 14.53cm^2 area and Dyches, [place] ([id number]) 1.453cm^3 volume. The wound is limited to skin breakdown. There is no tunneling or undermining noted. There is a none present amount  of drainage noted. The wound margin is flat and intact. There is no granulation within the wound bed. There is a large (67-100%) amount of necrotic tissue within the wound bed including Eschar. The periwound skin appearance exhibited: Dry/Scaly. Periwound temperature was noted as No Abnormality. The periwound has tenderness on palpation. 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 Plan Wound Cleansing: Wound #2 Right,Dorsal Foot: Clean wound with Normal Saline. Wound #3 Left Calcaneus: Clean wound with Normal Saline. Wound #4 Right Calcaneus: Clean wound with Normal Saline. Anesthetic: Wound #2 Right,Dorsal Foot: Topical Lidocaine 4% cream applied to wound bed prior to debridement - office use only Lewellyn, Yeiren ([id number]) Wound #3 Left Calcaneus: Topical Lidocaine 4% cream applied to wound bed prior to debridement - office use only Wound #4 Right Calcaneus: Topical Lidocaine 4% cream applied to wound bed prior to debridement - office use only Primary Wound Dressing: Wound #2 Right,Dorsal Foot: Foam Wound #3 Left Calcaneus: Santyl Ointment Wound #4 Right Calcaneus: Santyl Ointment Secondary Dressing: Wound #2 Right,Dorsal Foot: Conform/Kerlix - tape, netting Foam Wound #3 Left Calcaneus: Dry  Gauze Conform/Kerlix - tape, netting Foam - Allevyn heel cups Wound #4 Right Calcaneus: Dry Gauze Conform/Kerlix - tape, netting Foam - Allevyn heel cups Dressing Change Frequency: Wound #2 Right,Dorsal Foot: Change dressing every other day. Wound #3 Left Calcaneus: Change dressing every other day. Wound #4 Right Calcaneus: Change dressing every other day. Follow-up Appointments: Wound #2 Right,Dorsal Foot: Return Appointment in 2 weeks. Wound #3 Left Calcaneus: Return Appointment in 2 weeks. Wound #4 Right Calcaneus: Return Appointment in 2 weeks. Off-Loading: Heel suspension boot to: - pt to wear sage boots, please float heels Turn and reposition every 2 hours Other: - Advertising account planner Orders / Instructions: Wound #2 Right,Dorsal Foot: Increase protein intake. Wound #3 Left Calcaneus: Increase protein intake. Wound #4 Right Calcaneus: Increase protein intake. Home Health: Wound #2 Right,Dorsal Foot: [place] Visits - [place] *****Please Order [facility] for patient***** WILLIES, OZAKI ([id number]) Home Health Nurse may visit PRN to address patient s wound care needs. FACE TO FACE ENCOUNTER: MEDICARE and MEDICAID PATIENTS: I certify that this patient is under my care and that I had a face-to-face encounter that meets the physician face-to-face encounter requirements with this patient on this date. The encounter with the patient was in whole or in part for the following MEDICAL CONDITION: (primary reason for [place]) MEDICAL NECESSITY: I certify, that based on my findings, NURSING services are a medically necessary home health service. HOME BOUND STATUS: I certify that my clinical findings support that this patient is homebound (i.e., Due to illness or injury, pt requires aid of supportive devices such as crutches, cane, wheelchairs, walkers, the use of special transportation or the assistance of another person to leave their place of  residence. There is a normal inability to leave the home and doing so requires considerable and taxing effort. Other absences are for medical reasons / religious services and are infrequent or of short duration when for other reasons). If current dressing causes regression in wound condition, may D/C ordered dressing product/s and apply Normal Saline Moist Dressing daily until next [place] / Other MD appointment. [place] of regression in wound condition at [phone]. Please direct any NON-WOUND related issues/requests for orders to patient's Primary Care Physician Wound #3 Left Calcaneus: [place] Visits - [place] *****Please Order [place] for patient***** Home Health Nurse may visit PRN to address patient s wound care needs. FACE TO FACE ENCOUNTER: MEDICARE and MEDICAID PATIENTS: I certify  that this patient is under my care and that I had a face-to-face encounter that meets the physician face-to-face encounter requirements with this patient on this date. The encounter with the patient was in whole or in part for the following MEDICAL CONDITION: (primary reason for [place]) MEDICAL NECESSITY: I certify, that based on my findings, NURSING services are a medically necessary home health service. HOME BOUND STATUS: I certify that my clinical findings support that this patient is homebound (i.e., Due to illness or injury, pt requires aid of supportive devices such as crutches, cane, wheelchairs, walkers, the use of special transportation or the assistance of another person to leave their place of residence. There is a normal inability to leave the home and doing so requires considerable and taxing effort. Other absences are for medical reasons / religious services and are infrequent or of short duration when for other reasons). If current dressing causes regression in wound condition, may D/C ordered dressing product/s and  apply Normal Saline Moist Dressing daily until next [place] / Other MD appointment. [place] of regression in wound condition at [phone]. Please direct any NON-WOUND related issues/requests for orders to patient's Primary Care Physician Wound #4 Right Calcaneus: [place] Visits - [place] *****Please Order [place] for patient***** Home Health Nurse may visit PRN to address patient s wound care needs. FACE TO FACE ENCOUNTER: MEDICARE and MEDICAID PATIENTS: I certify that this patient is under my care and that I had a face-to-face encounter that meets the physician face-to-face encounter requirements with this patient on this date. The encounter with the patient was in whole or in part for the following MEDICAL CONDITION: (primary reason for [place]) MEDICAL NECESSITY: I certify, that based on my findings, NURSING services are a medically necessary home health service. HOME BOUND STATUS: I certify that my clinical findings support that this patient is homebound (i.e., Due to illness or injury, pt requires aid of supportive devices such as crutches, cane, wheelchairs, walkers, the use of special transportation or the assistance of another person to leave their place of residence. There is a normal inability to leave the home and doing so requires considerable and taxing effort. Other absences are for medical reasons / religious services and are infrequent or of short duration when for other reasons). If current dressing causes regression in wound condition, may D/C ordered dressing product/s and apply Normal Saline Moist Dressing daily until next [place] / Other MD appointment. [place] of regression in wound condition at [phone]. Please direct any NON-WOUND related issues/requests for orders to patient's Primary Care Physician Medications-please add to medication list.: Wound #2 Right,Dorsal  Foot: Glasheen, Honorio ([id number]) Santyl Enzymatic Ointment Other: - Vitamin C, Zinc, Multivitamin Wound #3 Left Calcaneus: Santyl Enzymatic Ointment Other: - Vitamin C, Zinc, Multivitamin Wound #4 Right Calcaneus: Santyl Enzymatic Ointment Other: - Vitamin C, Zinc, Multivitamin Follow-Up Appointments: A follow-up appointment should be scheduled. Medication Reconciliation completed and provided to Patient/Care Provider. A Patient Clinical Summary of Care was provided to [facility]. Recommend pt proceed with BLE angio for advanced evaluation of PAD. Electronic Signature(s) Signed: 05/22/2016 5:22:48 PM By: Londell Moh FNP Entered By: Londell Moh on 05/21/2016 12:53:41 Freda, Kazuto ([id number]) -------------------------------------------------------------------------------- SuperBill Details Patient Name: [name] Date of Service: 05/21/2016 Medical Record Number: [id number] Patient Account Number: [id number] Date of Birth/Sex: 19-Feb-1956 (60 y.o. 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 Facility Procedures CPT4 Code: [id number] Description: 99214 - WOUND CARE VISIT-LEV 4 EST PT Modifier: Quantity: 1 Physician Procedures CPT4: Description Modifier Quantity Code [id number] 99213 - WC PHYS LEVEL 3 - EST PT 1 ICD-10 Description Diagnosis L89.610 Pressure ulcer of right heel, unstageable L89.620 Pressure ulcer of left heel, unstageable I70.234 Atherosclerosis of native arteries  of right leg with  ulceration of heel and midfoot I70.245 Atherosclerosis of native arteries of left leg with ulceration of other part of foot Electronic Signature(s) Signed: 05/22/2016 5:22:48 PM By: Londell Moh FNP Entered By: Londell Moh on 05/21/2016 12:53:59

## 2016-05-26 ENCOUNTER — Encounter
Admission: RE | Disposition: A | Discharge status: Home / Self Care | Payer: Self-pay | Source: Ambulatory Visit | Attending: Vascular Surgery

## 2016-05-26 ENCOUNTER — Ambulatory Visit
Admission: RE | Admit: 2016-05-26 | Discharge: 2016-05-26 | Disposition: A | Discharge status: Home / Self Care | Payer: Medicare Other | Source: Ambulatory Visit | Attending: Vascular Surgery | Admitting: Vascular Surgery

## 2016-05-26 ENCOUNTER — Encounter: Payer: Self-pay | Admitting: *Deleted

## 2016-05-26 DIAGNOSIS — I809 Phlebitis and thrombophlebitis of unspecified site: Secondary | ICD-10-CM | POA: Diagnosis not present

## 2016-05-26 DIAGNOSIS — F17200 Nicotine dependence, unspecified, uncomplicated: Secondary | ICD-10-CM | POA: Insufficient documentation

## 2016-05-26 DIAGNOSIS — Z8673 Personal history of transient ischemic attack (TIA), and cerebral infarction without residual deficits: Secondary | ICD-10-CM | POA: Diagnosis not present

## 2016-05-26 DIAGNOSIS — D649 Anemia, unspecified: Secondary | ICD-10-CM | POA: Diagnosis not present

## 2016-05-26 DIAGNOSIS — R4701 Aphasia: Secondary | ICD-10-CM | POA: Insufficient documentation

## 2016-05-26 DIAGNOSIS — I739 Peripheral vascular disease, unspecified: Secondary | ICD-10-CM | POA: Insufficient documentation

## 2016-05-26 DIAGNOSIS — Z7982 Long term (current) use of aspirin: Secondary | ICD-10-CM | POA: Insufficient documentation

## 2016-05-26 DIAGNOSIS — L97919 Non-pressure chronic ulcer of unspecified part of right lower leg with unspecified severity: Secondary | ICD-10-CM | POA: Insufficient documentation

## 2016-05-26 DIAGNOSIS — Z8701 Personal history of pneumonia (recurrent): Secondary | ICD-10-CM | POA: Insufficient documentation

## 2016-05-26 DIAGNOSIS — I7025 Atherosclerosis of native arteries of other extremities with ulceration: Secondary | ICD-10-CM | POA: Diagnosis present

## 2016-05-26 DIAGNOSIS — I639 Cerebral infarction, unspecified: Secondary | ICD-10-CM | POA: Insufficient documentation

## 2016-05-26 HISTORY — PX: PERIPHERAL VASCULAR CATHETERIZATION: SHX172C

## 2016-05-26 SURGERY — LOWER EXTREMITY ANGIOGRAPHY
Anesthesia: Moderate Sedation | Site: Leg Lower | Laterality: Left

## 2016-05-26 MED ORDER — IOPAMIDOL (ISOVUE-300) INJECTION 61%
INTRAVENOUS | Status: DC | PRN
Start: 2016-05-26 — End: 2016-05-26
  Administered 2016-05-26: 75 mL via INTRA_ARTERIAL

## 2016-05-26 MED ORDER — MIDAZOLAM HCL 5 MG/5ML IJ SOLN
INTRAMUSCULAR | Status: AC
  Filled 2016-05-26: qty 5

## 2016-05-26 MED ORDER — LABETALOL HCL 5 MG/ML IV SOLN: 10.0000 mg | INTRAVENOUS | Status: DC | PRN

## 2016-05-26 MED ORDER — METOPROLOL TARTRATE 5 MG/5ML IV SOLN: 2.0000 mg | INTRAVENOUS | Status: DC | PRN

## 2016-05-26 MED ORDER — HYDROMORPHONE HCL 1 MG/ML IJ SOLN: 0.5000 mg | INTRAMUSCULAR | Status: DC | PRN

## 2016-05-26 MED ORDER — HYDRALAZINE HCL 20 MG/ML IJ SOLN: 5.0000 mg | INTRAMUSCULAR | Status: DC | PRN

## 2016-05-26 MED ORDER — FENTANYL CITRATE (PF) 100 MCG/2ML IJ SOLN
INTRAMUSCULAR | Status: AC
  Filled 2016-05-26: qty 2

## 2016-05-26 MED ORDER — CLOPIDOGREL BISULFATE 75 MG PO TABS: 75.0000 mg | ORAL_TABLET | Freq: Every day | ORAL | Status: DC

## 2016-05-26 MED ORDER — DIPHENHYDRAMINE HCL 50 MG/ML IJ SOLN
INTRAMUSCULAR | Status: DC | PRN
  Administered 2016-05-26: 50 mg via INTRAVENOUS

## 2016-05-26 MED ORDER — METHYLPREDNISOLONE SODIUM SUCC 125 MG IJ SOLR: 125.0000 mg | INTRAMUSCULAR | Status: DC | PRN

## 2016-05-26 MED ORDER — SODIUM CHLORIDE 0.9 % IV SOLN
INTRAVENOUS | Status: DC
  Administered 2016-05-26 (×2): via INTRAVENOUS

## 2016-05-26 MED ORDER — HEPARIN (PORCINE) IN NACL 2-0.9 UNIT/ML-% IJ SOLN
INTRAMUSCULAR | Status: AC
  Filled 2016-05-26: qty 1000

## 2016-05-26 MED ORDER — ACETAMINOPHEN 325 MG PO TABS
325.0000 mg | ORAL_TABLET | ORAL | Status: DC | PRN
Start: 2016-05-26 — End: 2016-05-26

## 2016-05-26 MED ORDER — ASPIRIN EC 81 MG PO TBEC: 81.0000 mg | DELAYED_RELEASE_TABLET | Freq: Every day | ORAL | Status: DC

## 2016-05-26 MED ORDER — FENTANYL CITRATE (PF) 100 MCG/2ML IJ SOLN
INTRAMUSCULAR | Status: DC | PRN
  Administered 2016-05-26 (×2): 25 ug via INTRAVENOUS
  Administered 2016-05-26: 50 ug via INTRAVENOUS

## 2016-05-26 MED ORDER — SODIUM CHLORIDE 0.9 % IV SOLN: INTRAVENOUS | Status: DC

## 2016-05-26 MED ORDER — FAMOTIDINE 20 MG PO TABS: 40.0000 mg | ORAL_TABLET | ORAL | Status: DC | PRN

## 2016-05-26 MED ORDER — LIDOCAINE-EPINEPHRINE (PF) 1 %-1:200000 IJ SOLN
INTRAMUSCULAR | Status: AC
  Filled 2016-05-26: qty 30

## 2016-05-26 MED ORDER — MIDAZOLAM HCL 2 MG/2ML IJ SOLN
INTRAMUSCULAR | Status: DC | PRN
  Administered 2016-05-26: 2 mg via INTRAVENOUS
  Administered 2016-05-26 (×2): 1 mg via INTRAVENOUS

## 2016-05-26 MED ORDER — DEXTROSE 5 % IV SOLN: 1.5000 g | INTRAVENOUS | Status: DC

## 2016-05-26 MED ORDER — HYDROMORPHONE HCL 1 MG/ML IJ SOLN: 1.0000 mg | Freq: Once | INTRAMUSCULAR | Status: DC

## 2016-05-26 MED ORDER — SODIUM CHLORIDE 0.9 % IV SOLN: 500.0000 mL | Freq: Once | INTRAVENOUS | Status: DC | PRN

## 2016-05-26 MED ORDER — ONDANSETRON HCL 4 MG/2ML IJ SOLN: 4.0000 mg | Freq: Four times a day (QID) | INTRAMUSCULAR | Status: DC | PRN

## 2016-05-26 MED ORDER — HEPARIN SODIUM (PORCINE) 1000 UNIT/ML IJ SOLN
INTRAMUSCULAR | Status: DC | PRN
  Administered 2016-05-26: 5000 [IU] via INTRAVENOUS

## 2016-05-26 MED ORDER — PHENOL 1.4 % MT LIQD: 1.0000 | OROMUCOSAL | Status: DC | PRN

## 2016-05-26 MED ORDER — DIPHENHYDRAMINE HCL 50 MG/ML IJ SOLN
INTRAMUSCULAR | Status: AC
  Filled 2016-05-26: qty 1

## 2016-05-26 MED ORDER — OXYCODONE-ACETAMINOPHEN 5-325 MG PO TABS: 1.0000 | ORAL_TABLET | ORAL | Status: DC | PRN

## 2016-05-26 MED ORDER — GUAIFENESIN-DM 100-10 MG/5ML PO SYRP: 15.0000 mL | ORAL_SOLUTION | ORAL | Status: DC | PRN

## 2016-05-26 MED ORDER — ATORVASTATIN CALCIUM 10 MG PO TABS: 10.0000 mg | ORAL_TABLET | Freq: Every day | ORAL | Status: AC

## 2016-05-26 MED ORDER — ACETAMINOPHEN 325 MG RE SUPP: 325.0000 mg | RECTAL | Status: DC | PRN

## 2016-05-26 MED ORDER — HEPARIN SODIUM (PORCINE) 1000 UNIT/ML IJ SOLN
INTRAMUSCULAR | Status: AC
  Filled 2016-05-26: qty 1

## 2016-05-26 SURGICAL SUPPLY — 17 items
BALLN LUTONIX 5X150X130 (BALLOONS) ×4
BALLN LUTONIX 6X150X130 (BALLOONS) ×4
BALLOON LUTONIX 5X150X130 (BALLOONS) ×2 IMPLANT
BALLOON LUTONIX 6X150X130 (BALLOONS) ×2 IMPLANT
CATH KA2 5FR 65CM (CATHETERS) ×4 IMPLANT
CATH PIG 70CM (CATHETERS) ×4 IMPLANT
CATH VERT 100CM (CATHETERS) ×4 IMPLANT
DEVICE PRESTO INFLATION (MISCELLANEOUS) ×4 IMPLANT
DEVICE STARCLOSE SE CLOSURE (Vascular Products) ×4 IMPLANT
DEVICE TORQUE (MISCELLANEOUS) ×4 IMPLANT
GLIDEWIRE ADV .035X260CM (WIRE) ×4 IMPLANT
PACK ANGIOGRAPHY (CUSTOM PROCEDURE TRAY) ×4 IMPLANT
SHEATH ANL2 6FRX45 HC (SHEATH) ×4 IMPLANT
SHEATH BRITE TIP 5FRX11 (SHEATH) ×4 IMPLANT
SYR MEDRAD MARK V 150ML (SYRINGE) ×4 IMPLANT
TUBING CONTRAST HIGH PRESS 72 (TUBING) ×4 IMPLANT
WIRE J 3MM .035X145CM (WIRE) ×4 IMPLANT

## 2016-05-26 NOTE — Progress Notes (Signed)
Spoke with Shanon Brow regarding patient's discharge instructions. Verbalized his understanding. Will be here to pick patient up around 1630. Patient currently sitting up and eating boxed lunch.

## 2016-05-26 NOTE — Progress Notes (Signed)
Per Dr. Lucky Cowboy, patient does not need follow-up appointment as he is scheduled here next week.

## 2016-05-26 NOTE — Discharge Instructions (Signed)
angiogramAngiogram, Care After °Refer to this sheet in the next few weeks. These instructions provide you with information about caring for yourself after your procedure. Your health care provider may also give you more specific instructions. Your treatment has been planned according to current medical practices, but problems sometimes occur. Call your health care provider if you have any problems or questions after your procedure. °WHAT TO EXPECT AFTER THE PROCEDURE °After your procedure, it is typical to have the following: °· Bruising at the catheter insertion site that usually fades within 1-2 weeks. °· Blood collecting in the tissue (hematoma) that may be painful to the touch. It should usually decrease in size and tenderness within 1-2 weeks. °HOME CARE INSTRUCTIONS °· Take medicines only as directed by your health care provider. °· You may shower 24-48 hours after the procedure or as directed by your health care provider. Remove the bandage (dressing) and gently wash the site with plain soap and water. Pat the area dry with a clean towel. Do not rub the site, because this may cause bleeding. °· Do not take baths, swim, or use a hot tub until your health care provider approves. °· Check your insertion site every day for redness, swelling, or drainage. °· Do not apply powder or lotion to the site. °· Do not lift over 10 lb (4.5 kg) for 5 days after your procedure or as directed by your health care provider. °· Ask your health care provider when it is okay to: °¨ Return to work or school. °¨ Resume usual physical activities or sports. °¨ Resume sexual activity. °· Do not drive home if you are discharged the same day as the procedure. Have someone else drive you. °· You may drive 24 hours after the procedure unless otherwise instructed by your health care provider. °· Do not operate machinery or power tools for 24 hours after the procedure or as directed by your health care provider. °· If your procedure was done  as an outpatient procedure, which means that you went home the same day as your procedure, a responsible adult should be with you for the first 24 hours after you arrive home. °· Keep all follow-up visits as directed by your health care provider. This is important. °SEEK MEDICAL CARE IF: °· You have a fever. °· You have chills. °· You have increased bleeding from the catheter insertion site. Hold pressure on the site. °SEEK IMMEDIATE MEDICAL CARE IF: °· You have unusual pain at the catheter insertion site. °· You have redness, warmth, or swelling at the catheter insertion site. °· You have drainage (other than a small amount of blood on the dressing) from the catheter insertion site. °· The catheter insertion site is bleeding, and the bleeding does not stop after 30 minutes of holding steady pressure on the site. °· The area near or just beyond the catheter insertion site becomes pale, cool, tingly, or numb. °  °This information is not intended to replace advice given to you by your health care provider. Make sure you discuss any questions you have with your health care provider. °  °Document Released: 05/15/2005 Document Revised: 11/17/2014 Document Reviewed: 03/30/2013 °Elsevier Interactive Patient Education ©2016 [employer] ° °

## 2016-05-26 NOTE — H&P (Signed)
  [facility] History & Physical Update  The patient was interviewed and re-examined.  The patient's previous History and Physical has been reviewed and is unchanged.  There is no change in the plan of care. We plan to proceed with the scheduled procedure.  Preethi Scantlebury, MD  05/26/2016, 12:23 PM

## 2016-05-26 NOTE — Op Note (Signed)
[facility] Percutaneous Study/Intervention Procedural Note   Date of Surgery: 05/26/2016  Surgeon(s):DEW,JASON   Assistants:none  Pre-operative Diagnosis: PAD with ulceration BLE  Post-operative diagnosis: Same  Procedure(s) Performed: 1. Ultrasound guidance for vascular access right femoral artery 2. Catheter placement into left popliteal artery from right femoral approach 3. Aortogram and selective left lower extremity angiogram 4. Percutaneous transluminal angioplasty of the left above-knee popliteal artery with 5 mm diameter Lutonix drug-coated angioplasty balloon, and mid to distal SFA with 5 mm diameter balloon 5. Percutaneous transluminal angioplasty of the proximal left SFA with 6 mm diameter Lutonix drug-coated angioplasty balloon  6. StarClose closure device right femoral artery  EBL: Minimal  Contrast: 75 cc  Fluoro Time: 10.5 minutes  Moderate Conscious Sedation Time: approximately 40 minutes using 4 mg of Versed and 100 mcg of Fentanyl  Indications: Patient is a 60 y.o.male with bilateral lower extremity nonhealing ulcerations. The patient has noninvasive study showing bilateral SFA occlusions. The patient is brought in for angiography for further evaluation and potential treatment. Risks and benefits are discussed and informed consent is obtained  Procedure: The patient was identified and appropriate procedural time out was performed. The patient was then placed supine on the table and prepped and draped in the usual sterile fashion.Moderate conscious sedation was administered during a face to face encounter with the patient throughout the procedure with my supervision of the RN administering medicines and monitoring the patient's vital signs, pulse oximetry, telemetry and mental status throughout from the start of the procedure until the patient was taken to the  recovery room. Ultrasound was used to evaluate the right common femoral artery. It was patent . A digital ultrasound image was acquired. A Seldinger needle was used to access the right common femoral artery under direct ultrasound guidance and a permanent image was performed. A 0.035 J wire was advanced without resistance and a 5Fr sheath was placed. Pigtail catheter was placed into the aorta and an AP aortogram was performed. This demonstrated normal renal arteries and normal aorta and iliac segments without significant stenosis. I then crossed the aortic bifurcation and advanced to the left femoral head. Selective left lower extremity angiogram was then performed. This demonstrated a flush left SFA occlusion with reconstitution of the popliteal artery above the knee and two-vessel runoff distally. The patient was systemically heparinized and a 6 [place] Ansell sheath was then placed over the [employer] wire. I then used a Kumpe catheter and the advantage wire to navigate through the SFA occlusion and confirm intraluminal flow in the popliteal artery at the level of the knee. I then elected to proceed with treatment and replaced the wire. A 5 mm diameter by 15 cm length Lutonix drug-coated angioplasty balloon was inflated from first in the popliteal artery with the distal extent at the level of the knee joint. The first inflation was to 8 atm for 1 minute. The balloon was then pulled back and used to treat the distal SFA and the mid SFA with inflations to 10-12 atm for 1 minute in these locations as well. For the proximal SFA, I upsized to a 6 mm diameter by 15 cm length Lutonix drug-coated angioplasty balloon with its more proximal extent into the common femoral artery. This was inflated to 8 atm for 1 minute. Completion angiogram following these for inflations demonstrated nonflow limiting dissection at the proximal SFA and at the reentry point in the above-knee popliteal artery without greater than 30%  stenosis identified in any  location. Distal runoff remained intact through 2 vessels. At this point, his perfusion had been significantly improved. I elected to terminate the procedure. The sheath was removed and StarClose closure device was deployed in the right femoral artery with excellent hemostatic result. The patient was taken to the recovery room in stable condition having tolerated the procedure well.  Findings:  Aortogram:  normal renal arteries and normal aorta and iliac segments without significant stenosis Left Lower Extremity:  flush left SFA occlusion with reconstitution of the popliteal artery above the knee and two-vessel runoff distally   Disposition: Patient was taken to the recovery room in stable condition having tolerated the procedure well.  Complications: None  DEW,JASON 05/26/2016 2:38 PM

## 2016-05-27 ENCOUNTER — Encounter: Payer: Self-pay | Admitting: Vascular Surgery

## 2016-05-30 ENCOUNTER — Other Ambulatory Visit
Admission: RE | Admit: 2016-05-30 | Discharge: 2016-05-30 | Disposition: A | Discharge status: Home / Self Care | Payer: Medicare Other | Source: Ambulatory Visit | Attending: Vascular Surgery | Admitting: Vascular Surgery

## 2016-05-30 DIAGNOSIS — L97419 Non-pressure chronic ulcer of right heel and midfoot with unspecified severity: Secondary | ICD-10-CM | POA: Insufficient documentation

## 2016-05-30 DIAGNOSIS — L97429 Non-pressure chronic ulcer of left heel and midfoot with unspecified severity: Secondary | ICD-10-CM | POA: Insufficient documentation

## 2016-05-30 DIAGNOSIS — Z01812 Encounter for preprocedural laboratory examination: Secondary | ICD-10-CM | POA: Diagnosis not present

## 2016-05-30 LAB — CREATININE, SERUM
Creatinine, Ser: 1.04 mg/dL (ref 0.61–1.24)
GFR calc Af Amer: >60 mL/min (ref >60)
GFR calc non Af Amer: >60 mL/min (ref >60)

## 2016-05-30 LAB — BUN: BUN: 23 mg/dL — AB (ref 6–20)

## 2016-06-05 ENCOUNTER — Encounter
Admission: RE | Disposition: A | Discharge status: Home / Self Care | Payer: Self-pay | Source: Ambulatory Visit | Attending: Vascular Surgery

## 2016-06-05 ENCOUNTER — Encounter: Payer: Self-pay | Admitting: *Deleted

## 2016-06-05 ENCOUNTER — Ambulatory Visit
Admission: RE | Admit: 2016-06-05 | Discharge: 2016-06-05 | Disposition: A | Discharge status: Home / Self Care | Payer: Medicare Other | Source: Ambulatory Visit | Attending: Vascular Surgery | Admitting: Vascular Surgery

## 2016-06-05 DIAGNOSIS — Z8673 Personal history of transient ischemic attack (TIA), and cerebral infarction without residual deficits: Secondary | ICD-10-CM | POA: Diagnosis not present

## 2016-06-05 DIAGNOSIS — J189 Pneumonia, unspecified organism: Secondary | ICD-10-CM | POA: Diagnosis not present

## 2016-06-05 DIAGNOSIS — R4701 Aphasia: Secondary | ICD-10-CM | POA: Diagnosis not present

## 2016-06-05 DIAGNOSIS — I639 Cerebral infarction, unspecified: Secondary | ICD-10-CM | POA: Diagnosis not present

## 2016-06-05 DIAGNOSIS — Z7982 Long term (current) use of aspirin: Secondary | ICD-10-CM | POA: Insufficient documentation

## 2016-06-05 DIAGNOSIS — D649 Anemia, unspecified: Secondary | ICD-10-CM | POA: Diagnosis not present

## 2016-06-05 DIAGNOSIS — L97919 Non-pressure chronic ulcer of unspecified part of right lower leg with unspecified severity: Secondary | ICD-10-CM | POA: Diagnosis not present

## 2016-06-05 DIAGNOSIS — I809 Phlebitis and thrombophlebitis of unspecified site: Secondary | ICD-10-CM | POA: Diagnosis not present

## 2016-06-05 DIAGNOSIS — F17200 Nicotine dependence, unspecified, uncomplicated: Secondary | ICD-10-CM | POA: Insufficient documentation

## 2016-06-05 DIAGNOSIS — I7025 Atherosclerosis of native arteries of other extremities with ulceration: Secondary | ICD-10-CM | POA: Diagnosis not present

## 2016-06-05 HISTORY — DX: Peripheral vascular disease, unspecified: I73.9

## 2016-06-05 HISTORY — DX: Essential (primary) hypertension: I10

## 2016-06-05 HISTORY — DX: Depression, unspecified: F32.A

## 2016-06-05 HISTORY — DX: Schizophrenia, unspecified: F20.9

## 2016-06-05 HISTORY — DX: Cerebral infarction, unspecified: I63.9

## 2016-06-05 HISTORY — DX: Angina pectoris, unspecified: I20.9

## 2016-06-05 HISTORY — PX: PERIPHERAL VASCULAR CATHETERIZATION: SHX172C

## 2016-06-05 HISTORY — DX: Major depressive disorder, single episode, unspecified: F32.9

## 2016-06-05 SURGERY — LOWER EXTREMITY ANGIOGRAPHY
Anesthesia: Moderate Sedation | Site: Leg Lower | Laterality: Right

## 2016-06-05 MED ORDER — ACETAMINOPHEN 325 MG RE SUPP: 325.0000 mg | RECTAL | Status: DC | PRN

## 2016-06-05 MED ORDER — MIDAZOLAM HCL 5 MG/5ML IJ SOLN
INTRAMUSCULAR | Status: AC
  Filled 2016-06-05: qty 5

## 2016-06-05 MED ORDER — ONDANSETRON HCL 4 MG/2ML IJ SOLN: 4.0000 mg | Freq: Four times a day (QID) | INTRAMUSCULAR | Status: DC | PRN

## 2016-06-05 MED ORDER — HYDRALAZINE HCL 20 MG/ML IJ SOLN
5.0000 mg | INTRAMUSCULAR | Status: DC | PRN
Start: 2016-06-05 — End: 2016-06-05

## 2016-06-05 MED ORDER — SODIUM CHLORIDE 0.9 % IV SOLN
INTRAVENOUS | Status: DC
  Administered 2016-06-05: 08:00:00 via INTRAVENOUS

## 2016-06-05 MED ORDER — GUAIFENESIN-DM 100-10 MG/5ML PO SYRP: 15.0000 mL | ORAL_SOLUTION | ORAL | Status: DC | PRN

## 2016-06-05 MED ORDER — OXYCODONE-ACETAMINOPHEN 5-325 MG PO TABS: 1.0000 | ORAL_TABLET | ORAL | Status: DC | PRN

## 2016-06-05 MED ORDER — METOPROLOL TARTRATE 5 MG/5ML IV SOLN: 2.0000 mg | INTRAVENOUS | Status: DC | PRN

## 2016-06-05 MED ORDER — PHENOL 1.4 % MT LIQD: 1.0000 | OROMUCOSAL | Status: DC | PRN

## 2016-06-05 MED ORDER — MIDAZOLAM HCL 2 MG/2ML IJ SOLN
INTRAMUSCULAR | Status: DC | PRN
  Administered 2016-06-05 (×2): 2 mg via INTRAVENOUS

## 2016-06-05 MED ORDER — IOPAMIDOL (ISOVUE-300) INJECTION 61%
INTRAVENOUS | Status: DC | PRN
  Administered 2016-06-05: 90 mL via INTRAVENOUS

## 2016-06-05 MED ORDER — LIDOCAINE-EPINEPHRINE (PF) 1 %-1:200000 IJ SOLN
INTRAMUSCULAR | Status: AC
  Filled 2016-06-05: qty 30

## 2016-06-05 MED ORDER — FENTANYL CITRATE (PF) 100 MCG/2ML IJ SOLN
INTRAMUSCULAR | Status: DC | PRN
  Administered 2016-06-05 (×2): 50 ug via INTRAVENOUS

## 2016-06-05 MED ORDER — HEPARIN SODIUM (PORCINE) 1000 UNIT/ML IJ SOLN
INTRAMUSCULAR | Status: AC
  Filled 2016-06-05: qty 1

## 2016-06-05 MED ORDER — HYDROMORPHONE HCL 1 MG/ML IJ SOLN: 0.5000 mg | INTRAMUSCULAR | Status: DC | PRN

## 2016-06-05 MED ORDER — LABETALOL HCL 5 MG/ML IV SOLN: 10.0000 mg | INTRAVENOUS | Status: DC | PRN

## 2016-06-05 MED ORDER — HEPARIN SODIUM (PORCINE) 1000 UNIT/ML IJ SOLN
INTRAMUSCULAR | Status: DC | PRN
  Administered 2016-06-05: 5000 [IU] via INTRAVENOUS

## 2016-06-05 MED ORDER — SODIUM CHLORIDE 0.9 % IV SOLN: 500.0000 mL | Freq: Once | INTRAVENOUS | Status: DC | PRN

## 2016-06-05 MED ORDER — ACETAMINOPHEN 325 MG PO TABS: 325.0000 mg | ORAL_TABLET | ORAL | Status: DC | PRN

## 2016-06-05 MED ORDER — FENTANYL CITRATE (PF) 100 MCG/2ML IJ SOLN
INTRAMUSCULAR | Status: AC
  Filled 2016-06-05: qty 2

## 2016-06-05 SURGICAL SUPPLY — 18 items
BALLN LUTONIX 5X150X130 (BALLOONS) ×3
BALLN LUTONIX 6X150X130 (BALLOONS) ×3
BALLOON LUTONIX 5X150X130 (BALLOONS) ×1 IMPLANT
BALLOON LUTONIX 6X150X130 (BALLOONS) ×1 IMPLANT
CATH KA2 5FR 65CM (CATHETERS) ×3 IMPLANT
CATH PIG 70CM (CATHETERS) ×3 IMPLANT
CATH RIM 65CM (CATHETERS) ×3 IMPLANT
CATH VERT 100CM (CATHETERS) ×3 IMPLANT
DEVICE PRESTO INFLATION (MISCELLANEOUS) ×3 IMPLANT
DEVICE STARCLOSE SE CLOSURE (Vascular Products) ×3 IMPLANT
GLIDEWIRE ADV .035X260CM (WIRE) ×3 IMPLANT
PACK ANGIOGRAPHY (CUSTOM PROCEDURE TRAY) ×3 IMPLANT
SHEATH ANL2 6FRX45 HC (SHEATH) ×3 IMPLANT
SHEATH BRITE TIP 5FRX11 (SHEATH) ×3 IMPLANT
STENT TIGRIS 6X100X120 (Permanent Stent) ×3 IMPLANT
SYR MEDRAD MARK V 150ML (SYRINGE) ×3 IMPLANT
TUBING CONTRAST HIGH PRESS 72 (TUBING) ×3 IMPLANT
WIRE J 3MM .035X145CM (WIRE) ×3 IMPLANT

## 2016-06-05 NOTE — Op Note (Signed)
[facility] Percutaneous Study/Intervention Procedural Note   Date of Surgery: 06/05/2016  Surgeon(s):Summers Buendia   Assistants:none  Pre-operative Diagnosis: PAD with ulceration bilateral lower extremities status post left lower extremity intervention  Post-operative diagnosis: Same  Procedure(s) Performed: 1. Ultrasound guidance for vascular access left femoral artery 2. Catheter placement into right popliteal artery from left femoral approach 3. selective right lower extremity angiogram 4. Percutaneous transluminal angioplasty of popliteal artery and SFA with a 5 mm diameter by 15 cm length Lutonix angioplasty balloon inflated 3 times and a 6 mm diameter by 15 cm length Lutonix angioplasty balloon for the proximal SFA 5. Tigris stent placement to the right popliteal artery for greater than 50% residual stenosis and dissection after angioplasty using a 6 mm diameter by 10 cm length stent  6.  StarClose closure device left femoral artery  EBL: Minimal  Contrast: 90 cc  Fluoro Time: 11.5 minutes  Moderate Conscious Sedation Time: approximately 50 minutes using 4 mg of Versed and 100 mcg of Fentanyl  Indications: Patient is a 60 y.o.male with nonhealing ulcerations bilaterally. The patient has noninvasive study showing bilateral SFA occlusions. He is status post treatment of the left leg. The patient is brought in for angiography for further evaluation and potential treatment. Risks and benefits are discussed and informed consent is obtained  Procedure: The patient was identified and appropriate procedural time out was performed. The patient was then placed supine on the table and prepped and draped in the usual sterile fashion.Moderate conscious sedation was administered during a face to face encounter with the patient throughout the procedure with my supervision of the RN  administering medicines and monitoring the patient's vital signs, pulse oximetry, telemetry and mental status throughout from the start of the procedure until the patient was taken to the recovery room. Ultrasound was used to evaluate the left common femoral artery. It was patent . A digital ultrasound image was acquired. A Seldinger needle was used to access the left common femoral artery under direct ultrasound guidance and a permanent image was performed. A 0.035 J wire was advanced without resistance and a 5Fr sheath was placed. I then crossed the aortic bifurcation and advanced to the right femoral head. Selective right lower extremity angiogram was then performed. This demonstrated flush right SFA occlusion with reconstitution of the popliteal artery just above the knee and what appeared to be at least two-vessel runoff distally. The patient was systemically heparinized and a 6 [place] Ansell sheath was then placed over the [employer] wire. I then used a Kumpe catheter and the advantage wire to get into the SFA and navigate through the occlusion with a mild amount of difficulty. Confirmed intraluminal flow with the popliteal artery just below the knee. The wire was then replaced. I used a 5 mm diameter by 15 cm length Lutonix drug-coated angioplasty balloon starting just at the knee. Inflation was from 8-10 atm for 1 minute and a total of 3 inflations were used to treat the popliteal artery, and mid to distal SFA. For the proximal SFA up to the common femoral artery, I upsized to a 6 mm diameter by 15 cm length Lutonix drug-coated angioplasty balloon. This was inflated to 8 atm for 1 minute. In the proximal SFA, there was about a 30-40% residual stenosis which was not flow limiting. The mid to distal SFA were patent. The reentry point in the popliteal artery had a dissection and a greater than 50% residual stenosis so I elected to treat  this area with a stent. A 6 mm diameter by 10 cm length Tigris  stent was selected and deployed in the popliteal artery to encompass the lesion. This was postdilated with a 5 mm balloon with less than 20% residual stenosis and maintained runoff distally. I elected to terminate the procedure. The sheath was removed and StarClose closure device was deployed in the left femoral artery with excellent hemostatic result. The patient was taken to the recovery room in stable condition having tolerated the procedure well.  Findings:   Right Lower Extremity: This demonstrated flush right SFA occlusion with reconstitution of the popliteal artery just above the knee and what appeared to be at least two-vessel runoff distally.    Disposition: Patient was taken to the recovery room in stable condition having tolerated the procedure well.  Complications: None  Isauro Skelley 06/05/2016 10:01 AM

## 2016-06-05 NOTE — H&P (Signed)
  [facility] History & Physical Update  The patient was interviewed and re-examined.  The patient's previous History and Physical has been reviewed and is unchanged.  There is no change in the plan of care. We plan to proceed with the scheduled procedure.  Jobin Montelongo, MD  06/05/2016, 8:06 AM

## 2016-06-06 ENCOUNTER — Encounter: Payer: Self-pay | Admitting: Vascular Surgery

## 2016-06-10 ENCOUNTER — Other Ambulatory Visit: Payer: Self-pay | Admitting: Internal Medicine

## 2016-06-10 ENCOUNTER — Ambulatory Visit
Admission: RE | Admit: 2016-06-10 | Discharge: 2016-06-10 | Disposition: A | Discharge status: Home / Self Care | Payer: Medicare Other | Source: Ambulatory Visit | Attending: Internal Medicine | Admitting: Internal Medicine

## 2016-06-10 ENCOUNTER — Encounter: Payer: Medicare Other | Attending: Internal Medicine | Admitting: Internal Medicine

## 2016-06-10 DIAGNOSIS — B999 Unspecified infectious disease: Secondary | ICD-10-CM | POA: Diagnosis present

## 2016-06-10 DIAGNOSIS — Z8673 Personal history of transient ischemic attack (TIA), and cerebral infarction without residual deficits: Secondary | ICD-10-CM | POA: Diagnosis not present

## 2016-06-10 DIAGNOSIS — L89610 Pressure ulcer of right heel, unstageable: Secondary | ICD-10-CM | POA: Diagnosis not present

## 2016-06-10 DIAGNOSIS — I70234 Atherosclerosis of native arteries of right leg with ulceration of heel and midfoot: Secondary | ICD-10-CM | POA: Insufficient documentation

## 2016-06-10 DIAGNOSIS — I70245 Atherosclerosis of native arteries of left leg with ulceration of other part of foot: Secondary | ICD-10-CM | POA: Diagnosis not present

## 2016-06-10 DIAGNOSIS — Z87891 Personal history of nicotine dependence: Secondary | ICD-10-CM | POA: Diagnosis not present

## 2016-06-10 DIAGNOSIS — I255 Ischemic cardiomyopathy: Secondary | ICD-10-CM | POA: Diagnosis not present

## 2016-06-10 DIAGNOSIS — L89620 Pressure ulcer of left heel, unstageable: Secondary | ICD-10-CM | POA: Diagnosis not present

## 2016-06-11 ENCOUNTER — Other Ambulatory Visit
Admission: RE | Admit: 2016-06-11 | Discharge: 2016-06-11 | Disposition: A | Discharge status: Home / Self Care | Payer: Medicare Other | Source: Ambulatory Visit | Attending: Internal Medicine | Admitting: Internal Medicine

## 2016-06-11 DIAGNOSIS — B999 Unspecified infectious disease: Secondary | ICD-10-CM | POA: Diagnosis present

## 2016-06-11 NOTE — Progress Notes (Addendum)
AKRAM, MOBBS ([id number]) Visit Report for 06/10/2016 Chief Complaint Document Details Patient Name: [name] Date of Service: 06/10/2016 8:45 AM Medical Record Patient Account Number: [id number] Number: Treating RN: Ahmed Prima 10-Jan-1956 (59 y.o. Other Clinician: Date of Birth/Sex: Male) Treating ROBSON, MICHAEL Primary Care Physician: [name] Physician/Extender: G Referring Physician: Claudie Revering in Treatment: 9 Information Obtained from: Patient Chief Complaint The patient is here for wounds on his bilateral feet supposedly obtained during a hospitalization from 3/24 through 4/6 Electronic Signature(s) Signed: 06/11/2016 7:57:27 AM By: Linton Ham MD Entered By: Linton Ham on 06/10/2016 10:24:11 [name] ([id number]) -------------------------------------------------------------------------------- Debridement Details Patient Name: [name] Date of Service: 06/10/2016 8:45 AM Medical Record Patient Account Number: [id number] Number: Treating RN: Ahmed Prima 1955-11-16 (59 y.o. Other Clinician: Date of Birth/Sex: Male) Treating ROBSON, [place] Primary Care Physician: [name] Physician/Extender: G Referring Physician: Claudie Revering in Treatment: 9 Debridement Performed for Wound #3 Left Calcaneus Assessment: Performed By: Physician [name], MD Debridement: Debridement Pre-procedure Yes Verification/Time Out Taken: Start Time: 09:42 Pain Control: Other : lidocaine 4% cream Level: Skin/Subcutaneous Tissue Total Area Debrided (L x 2.8 (cm) x 8 (cm) = 22.4 (cm) W): Tissue and other Viable, Non-Viable, Eschar, Exudate, Fibrin/Slough, Subcutaneous material debrided: Instrument: Blade, Forceps Bleeding: Minimum Hemostasis Achieved: Pressure End Time: 09:45 Procedural Pain: 0 Post Procedural Pain: 0 Response to Treatment: Procedure was tolerated well Post Debridement Measurements  of Total Wound Length: (cm) 2.5 Stage: Category/Stage II Width: (cm) 7.3 Depth: (cm) 0.2 Volume: (cm) 2.867 Post Procedure Diagnosis Same as Pre-procedure Electronic Signature(s) Signed: 06/10/2016 4:35:54 PM By: Alric Quan Signed: 06/11/2016 7:57:27 AM By: Linton Ham MD Entered By: Linton Ham on 06/10/2016 10:23:53 [name] ([id number]) -------------------------------------------------------------------------------- HPI Details Patient Name: [name] Date of Service: 06/10/2016 8:45 AM Medical Record Patient Account Number: [id number] Number: Treating RN: Ahmed Prima Jul 16, 1956 (59 y.o. Other Clinician: Date of Birth/Sex: Male) Treating ROBSON, MICHAEL Primary Care Physician: [name] Physician/Extender: G Referring Physician: Claudie Revering in Treatment: 9 History of Present Illness HPI Description: 04/08/16; this is a patient we really don't know too much about. He was hospitalized from 3/24 through 4/6 residing with a sodium of 167 creatinine of 2.49 right lower lobe pneumonia sepsis/septic shock syndrome. He apparently came back to [facility] He apparently had been there only 2 weeks before he went out. I know very little about him premorbidly. The hospital he had a CT scan of the head that showed atrophy and chronic small vessel white matter ischemic changes and remote infarcts also noted within the bilateral basal ganglia. His echocardiogram and presentation showed a EF of 20-25%, left ventricle was moderately dilated with diffuse hypokinesis. Apparently he return to his assisted living with wounds on his bilateral feet. According to the attendant came with him he could walk before he went out he could no longer walk now. There are wounds on both heels covered with a black eschar and also wounds on his bilateral dorsal feet. ABIs calculated in our clinic for 0.49  bilaterally. He was a smoker up to 2 months ago he is not smoking now. 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 Monophasic waves distally bilaterally. He is going back at the end of the month I believe June  29 probably for an arteriogram as arranged by Dr. Delana Meyer in the meantime he has had no real changes 04/30/16; we are waiting vascular review on June 29. The black eschar over his bilateral heels is beginning to separate using Santyl. The areas on the dorsal aspect of his feet looked considerably better/resolved. He apparently has no complaints. He also has a severe presumably ischemic cardiomyopathy as noted above 05/21/16:pt returns today for ongoing evaluation of wounds. I reviewed recent venous and arterial diagnostic findings. He is scheduled for a LLE angio on 05/26/16 and RLE angio on 06/02/16 for advanced evaluation of PAD as noted per lower extremity arterial duplex. BLE venous duplex was negative for venous reflux, DVT or insufficiency. 06/10/16; this is a patient I have not seen in over a month although he was seen here on 7/12. When I admitted him to our clinic I felt he had pressure ulcerations but also potential for limb threatening ischemia. I referred him urgently to vascular surgery. Since we have last seen him in this clinic he has undergone 2 procedures. On 7/17 he will underwent percutaneous transluminal angioplasty of the left above-knee popliteal artery as well as the mid to distal SFA. On 7/27 he underwent for cutaneous transluminal angioplasty of the right popliteal artery and SFA an angioplasty of the proximal SFA. He had a stent placement in the right popliteal artery. The patient is difficult to gauge symptomatology although he does not currently complain of pain. Our intake nurse reports purulent  drainage out of the left heel and an odor. Electronic Signature(s) ALDAIR, MENNELLA ([id number]) Signed: 06/11/2016 7:57:27 AM By: Linton Ham MD Entered By: Linton Ham on 06/10/2016 10:27:49 [name] ([id number]) -------------------------------------------------------------------------------- Physical Exam Details Patient Name: [name] Date of Service: 06/10/2016 8:45 AM Medical Record Patient Account Number: [id number] Number: Treating RN: Ahmed Prima 05-20-1956 (59 y.o. Other Clinician: Date of Birth/Sex: Male) Treating ROBSON, MICHAEL Primary Care Physician: [name] Physician/Extender: G Referring Physician: Claudie Revering in Treatment: 9 Constitutional Sitting or standing Blood Pressure is within target range for patient.. Pulse regular and within target range for patient.. Patient is no distress. Cardiovascular Pedal pulses absent bilaterally. This is an improvement from last time I saw him. Integumentary (Hair, Skin) Patient had adherent black eschar over the right heel I did not attempt to remove this today. The left heel was already separating therefore this was removed with pickups and a scalpel.. Notes Wound exam; the right heel wound is still covered with a tightly adherent black eschar. I made no attempt to remove this today there is no surrounding tenderness or erythema. The area on the left heel and already started to separate and liquefied. Our intake nurses noted purulent drainage. He had black eschar was removed. The surface under this does not look particularly viable it is likely he'll need further Brightman in this area. Some of the purulent drainage was sent for culture and I empirically put him on doxycycline. It is difficult to determine his symptomatology although there was no overt tenderness around this no erythema and no soft tissue crepitus Electronic Signature(s) Signed: 06/11/2016 7:57:27 AM By: Linton Ham  MD Entered By: Linton Ham on 06/10/2016 10:31:18 [name] ([id number]) -------------------------------------------------------------------------------- Physician Orders Details Patient Name: [name] Date of Service: 06/10/2016 8:45 AM Medical Record Patient Account Number: [id number] Number: Treating RN: Ahmed Prima 1956-08-02 (59 y.o. Other Clinician: Date of Birth/Sex: Male) Treating ROBSON, MICHAEL Primary Care Physician: [name] Physician/Extender: G Referring Physician: Claudie Revering  in Treatment: 9 Verbal / Phone Orders: Yes Clinician: Ashok Cordia, Debi Read Back and Verified: Yes Diagnosis Coding Wound Cleansing Wound #3 Left Calcaneus o Clean wound with Normal Saline. o Cleanse wound with mild soap and water Wound #4 Right Calcaneus o Clean wound with Normal Saline. o Cleanse wound with mild soap and water Anesthetic Wound #3 Left Calcaneus o Topical Lidocaine 4% cream applied to wound bed prior to debridement - office use only Wound #4 Right Calcaneus o Topical Lidocaine 4% cream applied to wound bed prior to debridement - office use only Primary Wound Dressing Wound #3 Left Calcaneus o Aquacel Ag Wound #4 Right Calcaneus o Santyl Ointment Secondary Dressing Wound #3 Left Calcaneus o ABD pad o Dry Gauze o Conform/Kerlix - tape, netting Wound #4 Right Calcaneus o ABD pad o Dry Gauze o Conform/Kerlix - tape, netting Santacroce, Rowdy ([id number]) Dressing Change Frequency Wound #3 Left Calcaneus o Change dressing every other day. Wound #4 Right Calcaneus o Change dressing every other day. Follow-up Appointments Wound #3 Left Calcaneus o Return Appointment in 1 week. Wound #4 Right Calcaneus o Return Appointment in 1 week. Off-Loading o Heel suspension boot to: - pt to wear sage boots, please float heels o Turn and reposition every 2 hours o Other: - Location manager  Orders / Instructions Wound #3 Left Calcaneus o Increase protein intake. Wound #4 Right Calcaneus o Increase protein intake. Home Health Wound #3 Left Calcaneus o Continue Home Health Visits - [facility] *****Please Order Michiel Sites Lift for patient***** o Home Health Nurse may visit PRN to address patientos wound care needs. o FACE TO FACE ENCOUNTER: MEDICARE and MEDICAID PATIENTS: I certify that this patient is under my care and that I had a face-to-face encounter that meets the physician face-to-face encounter requirements with this patient on this date. The encounter with the patient was in whole or in part for the following MEDICAL CONDITION: (primary reason for Home Healthcare) MEDICAL NECESSITY: I certify, that based on my findings, NURSING services are a medically necessary home health service. HOME BOUND STATUS: I certify that my clinical findings support that this patient is homebound (i.e., Due to illness or injury, pt requires aid of supportive devices such as crutches, cane, wheelchairs, walkers, the use of special transportation or the assistance of another person to leave their place of residence. There is a normal inability to leave the home and doing so requires considerable and taxing effort. Other absences are for medical reasons / religious services and are infrequent or of short duration when for other reasons). o If current dressing causes regression in wound condition, may D/C ordered dressing product/s and apply Normal Saline Moist Dressing daily until next Wound Healing Center / Other MD appointment. Notify Wound Healing Center of regression in wound condition at [phone]. o Please direct any NON-WOUND related issues/requests for orders to patient's Primary Care Physician Wound #4 Right Calcaneus KADE, MAZZIOTTI ([id number]) o Continue Home Health Visits - [facility] *****Please Order Michiel Sites Lift for patient***** o Home Health Nurse  may visit PRN to address patientos wound care needs. o FACE TO FACE ENCOUNTER: MEDICARE and MEDICAID PATIENTS: I certify that this patient is under my care and that I had a face-to-face encounter that meets the physician face-to-face encounter requirements with this patient on this date. The encounter with the patient was in whole or in part for the following MEDICAL CONDITION: (primary reason for Home Healthcare) MEDICAL NECESSITY: I certify, that based on my findings, NURSING  services are a medically necessary home health service. HOME BOUND STATUS: I certify that my clinical findings support that this patient is homebound (i.e., Due to illness or injury, pt requires aid of supportive devices such as crutches, cane, wheelchairs, walkers, the use of special transportation or the assistance of another person to leave their place of residence. There is a normal inability to leave the home and doing so requires considerable and taxing effort. Other absences are for medical reasons / religious services and are infrequent or of short duration when for other reasons). o If current dressing causes regression in wound condition, may D/C ordered dressing product/s and apply Normal Saline Moist Dressing daily until next [facility] / Other MD appointment. [place] of regression in wound condition at [phone]. o Please direct any NON-WOUND related issues/requests for orders to patient's Primary Care Physician Medications-please add to medication list. Wound #3 Left Calcaneus o P.O. Antibiotics - Doxycycline 100mg  po BID x 7days o Other: - Vitamin C, Zinc, Multivitamin Wound #4 Right Calcaneus o Santyl Enzymatic Ointment o Other: - Vitamin C, Zinc, Multivitamin Laboratory o Bacteria identified in Wound by Culture (MICRO) - Left heel oooo LOINC Code: [id number] oooo Convenience Name: Wound culture routine Radiology o X-ray, heel - bilateral  heels Patient Medications Allergies: NKDA Notifications Medication Indication Start End doxycycline hyclate DOSE po - oral 100 mg capsule - po capsule oral Elmes, Rohin ([id number]) Electronic Signature(s) Signed: 07/08/2016 1:55:35 PM By: Alric Quan Signed: 07/09/2016 6:00:53 PM By: Linton Ham MD Previous Signature: 06/10/2016 4:35:54 PM Version By: Alric Quan Previous Signature: 06/11/2016 7:57:27 AM Version By: Linton Ham MD Entered By: Alric Quan on 06/17/2016 14:00:23 SEGER, GOUKER ([id number]) -------------------------------------------------------------------------------- Prescription 06/10/2016 Patient Name: [name] Physician: [name] MD Date of Birth: 11/29/1955 NPI#: [id number] Sex: M DEA#: [id number] Phone #: [phone] License #: [id number] Patient Address: [place], [place] [address] [facility] [address] [place], [place] [address] [phone] Allergies NKDA Medication Medication: Route: Strength: Form: doxycycline hyclate 100 mg oral 100 mg capsule capsule Class: TETRACYCLINES Dose: Frequency / Time: Indication: po po capsule oral Number of Refills: Number of Units: 0 Generic Substitution: Start Date: End Date: One Time Use: Substitution Permitted No Note to Pharmacy: Signature(s): Date(s): Electronic Signature(s) ZAINE, ZUHLKE ([id number]) Signed: 07/08/2016 1:55:35 PM By: Alric Quan Signed: 07/09/2016 6:00:53 PM By: Linton Ham MD Previous Signature: 06/11/2016 7:57:27 AM Version By: Linton Ham MD Entered By: Alric Quan on 06/17/2016 14:00:25 [name] ([id number]) --------------------------------------------------------------------------------  Problem List Details Patient Name: [name] Date of Service: 06/10/2016 8:45 AM Medical Record Patient Account Number:  [id number] Number: Treating RN: Ahmed Prima 1956/05/06 (59 y.o. 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 Other Clinician: Date of Birth/Sex: Male) Treating ROBSON, MICHAEL Primary Care Physician: [name] Physician/Extender: G Referring Physician: Claudie Revering in Treatment: 9 Subjective Chief Complaint Information obtained from Patient The patient is here for wounds on his bilateral feet supposedly obtained during a hospitalization from 3/24 through 4/6 History of Present Illness (HPI) 04/08/16; this is a patient we really don't know too much about. He was hospitalized from 3/24 through 4/6 residing with a sodium of 167 creatinine of 2.49 right lower lobe pneumonia sepsis/septic shock syndrome. He apparently came back to [facility] He apparently had been there only 2 weeks  before he went out. I know very little about him premorbidly. The hospital he had a CT scan of the head that showed atrophy and chronic small vessel white matter ischemic changes and remote infarcts also noted within the bilateral basal ganglia. His echocardiogram and presentation showed a EF of 20-25%, left ventricle was moderately dilated with diffuse hypokinesis. Apparently he return to his assisted living with wounds on his bilateral feet. According to the attendant came with him he could walk before he went out he could no longer walk now. There are wounds on both heels covered with a black eschar and also wounds on his bilateral dorsal feet. ABIs calculated in our clinic for 0.49 bilaterally. He was a smoker up to 2 months ago he is not smoking now. 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 Monophasic waves distally bilaterally. He is going back at the end of the month I believe June 29 probably for an arteriogram as arranged by Dr. Delana Meyer in the meantime he has had no real changes 04/30/16; we are waiting vascular review on June 29. The black eschar over his bilateral heels is beginning to separate using Santyl. The areas on the dorsal aspect of his feet looked considerably better/resolved. He apparently has no complaints. He also has a severe presumably ischemic cardiomyopathy as noted above 05/21/16:pt returns today for ongoing evaluation of wounds. I reviewed recent venous and arterial diagnostic findings. He is scheduled for a LLE angio on 05/26/16 and RLE angio on 06/02/16 for advanced evaluation of PAD as noted per lower extremity arterial duplex. BLE venous duplex was negative for venous reflux, DVT or insufficiency. 06/10/16; this is a patient I have not seen in over a month although he was seen here on 7/12. When  I admitted him to our clinic I felt he had pressure ulcerations but also potential for limb threatening ischemia. I referred him urgently to vascular surgery. Since we have last seen him in this clinic he has undergone 2 procedures. On 7/17 he will underwent percutaneous transluminal angioplasty of the left above-knee popliteal artery as well as the mid to distal SFA. On 7/27 he underwent for cutaneous transluminal Gaines, Dantre ([id number]) angioplasty of the right popliteal artery and SFA an angioplasty of the proximal SFA. He had a stent placement in the right popliteal artery. The patient is difficult to gauge symptomatology although he does not currently complain of pain. Our intake nurse reports purulent drainage out of the left heel and an odor. Objective Constitutional Sitting or standing Blood Pressure is within target range for patient.. Pulse regular and within target range for patient.. Patient is no  distress. Vitals Time Taken: 9:17 AM, Height: 70 in, Pulse: 64 bpm, Respiratory Rate: 16 breaths/min, Blood Pressure: 110/72 mmHg. Cardiovascular Pedal pulses absent bilaterally. This is an improvement from last time I saw him. General Notes: Wound exam; the right heel wound is still covered with a tightly adherent black eschar. I made no attempt to remove this today there is no surrounding tenderness or erythema. The area on the left heel and already started to separate and liquefied. Our intake nurses noted purulent drainage. He had black eschar was removed. The surface under this does not look particularly viable it is likely he'll need further Brightman in this area. Some of the purulent drainage was sent for culture and I empirically put him on doxycycline. It is difficult to determine his symptomatology although there was no overt tenderness around this no erythema and no soft tissue crepitus Integumentary (Hair, Skin) Patient had adherent black eschar over the right heel I did  not attempt to remove this today. The left heel was already separating therefore this was removed with pickups and a scalpel.. Wound #2 status is Open. Original cause of wound was Pressure Injury. The wound is located on the Right,Dorsal Foot. The wound measures 0.6cm length x 0.8cm width x 0.1cm depth; 0.377cm^2 area and 0.038cm^3 volume. The wound is limited to skin breakdown. There is no undermining noted. There is a medium amount of serosanguineous drainage noted. The wound margin is flat and intact. There is no granulation within the wound bed. There is a large (67-100%) amount of necrotic tissue within the wound bed including Eschar. The periwound skin appearance exhibited: Localized Edema, Moist. Periwound temperature was noted as No Abnormality. The periwound has tenderness on palpation. Wound #3 status is Open. Original cause of wound was Pressure Injury. The wound is located on the Left Calcaneus. The wound measures 2.5cm length x 8cm width x 0.1cm depth; 15.708cm^2 area and 1.571cm^3 volume. The wound is limited to skin breakdown. There is no tunneling or undermining noted. There is a small amount of purulent drainage noted. The wound margin is flat and intact. There is no granulation within the wound bed. There is a large (67-100%) amount of necrotic tissue within the wound bed including Eschar. The periwound skin appearance exhibited: Dry/Scaly. Periwound temperature was noted as No Abnormality. The periwound has tenderness on palpation. Wik, Derreon ([id number]) Wound #4 status is Open. Original cause of wound was Pressure Injury. The wound is located on the Right Calcaneus. The wound measures 2cm length x 5.3cm width x 0.1cm depth; 8.325cm^2 area and 0.833cm^3 volume. The wound is limited to skin breakdown. There is no tunneling or undermining noted. There is a small amount of purulent drainage noted. The wound margin is flat and intact. There is no granulation within the wound  bed. There is a large (67-100%) amount of necrotic tissue within the wound bed including Eschar. The periwound skin appearance exhibited: Dry/Scaly. Periwound temperature was noted as No Abnormality. The periwound has tenderness on palpation. 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 There was a Skin/Subcutaneous Tissue Debridement [id number]) debridement with total area of 22.4 sq cm performed by [name], MD.  with the following instrument(s): Blade and Forceps to remove Viable and Non-Viable tissue/material including Exudate, Fibrin/Slough, Eschar, and Subcutaneous after achieving pain control using Other (lidocaine 4% cream). A time out was conducted prior to the start of the procedure. A Minimum amount of bleeding was controlled with Pressure. The procedure was tolerated well with a pain level of 0 throughout and a pain level of 0 following the procedure. Post Debridement Measurements: 2.5cm length x 7.3cm width x 0.2cm depth; 2.867cm^3 volume. Post debridement Stage noted as Category/Stage II. Post procedure Diagnosis Wound #3: Same as Pre-Procedure Plan Wound Cleansing: Wound #2 Right,Dorsal Foot: Gene, Kawika ([id number]) Clean wound with Normal Saline. Cleanse wound with mild soap and water Wound #3 Left Calcaneus: Clean wound with Normal Saline. Cleanse wound with mild soap and water Wound #4 Right Calcaneus: Clean wound with Normal Saline. Cleanse wound with mild soap and water Anesthetic: Wound #2 Right,Dorsal Foot: Topical Lidocaine 4% cream applied to wound bed prior to debridement - office use  only Wound #3 Left Calcaneus: Topical Lidocaine 4% cream applied to wound bed prior to debridement - office use only Wound #4 Right Calcaneus: Topical Lidocaine 4% cream applied to wound bed prior to debridement - office use only Primary Wound Dressing: Wound #2 Right,Dorsal Foot: Foam Wound #3 Left Calcaneus: Aquacel Ag Wound #4 Right Calcaneus: Santyl Ointment Secondary Dressing: Wound #2 Right,Dorsal Foot: Conform/Kerlix - tape, netting Foam Wound #3 Left Calcaneus: ABD pad Dry Gauze Conform/Kerlix - tape, netting Wound #4 Right Calcaneus: ABD pad Dry Gauze Conform/Kerlix - tape, netting Dressing Change Frequency: Wound #2 Right,Dorsal Foot: Change dressing every other day. Wound #3 Left Calcaneus: Change dressing every other day. Wound #4 Right Calcaneus: Change dressing every other day. Follow-up Appointments: Wound #2 Right,Dorsal Foot: Return Appointment in 1 week. Wound #3 Left Calcaneus: Return Appointment in 1 week. Wound #4 Right Calcaneus: Return Appointment in 1 week. Off-Loading: Heel suspension boot to: - pt to wear sage boots, please float heels Turn and reposition every 2 hours Konkle, Konnor ([id number]) Other: Harrel Lemon Lift Additional Orders / Instructions: Wound #2 Right,Dorsal Foot: Increase protein intake. Wound #3 Left Calcaneus: Increase protein intake. Wound #4 Right Calcaneus: Increase protein intake. Home Health: Wound #2 Right,Dorsal Foot: [place] [facility] - [place] *****Please Order [place] for patient***** Home Health Nurse may visit PRN to address patient s wound care needs. FACE TO FACE ENCOUNTER: MEDICARE and MEDICAID PATIENTS: I certify that this patient is under my care and that I had a face-to-face encounter that meets the physician face-to-face encounter requirements with this patient on this date. The encounter with the patient was in whole or in part for the following MEDICAL CONDITION: (primary  reason for [place]) MEDICAL NECESSITY: I certify, that based on my findings, NURSING services are a medically necessary home health service. HOME BOUND STATUS: I certify that my clinical findings support that this patient is homebound (i.e., Due to illness or injury, pt requires aid of supportive devices such as crutches, cane, wheelchairs, walkers, the use of special transportation or the assistance of another person to leave their place of residence. There is a normal inability to leave the home and doing so requires considerable and taxing effort. Other absences are for medical reasons / religious services and are infrequent or of short duration when for other reasons). If current dressing causes regression in wound condition, may D/C ordered dressing product/s and apply Normal Saline Moist Dressing daily until next [place] /  Other MD appointment. [place] of regression in wound condition at [phone]. Please direct any NON-WOUND related issues/requests for orders to patient's Primary Care Physician Wound #3 Left Calcaneus: [place] [facility] - [place] *****Please Order [place] for patient***** Home Health Nurse may visit PRN to address patient s wound care needs. FACE TO FACE ENCOUNTER: MEDICARE and MEDICAID PATIENTS: I certify that this patient is under my care and that I had a face-to-face encounter that meets the physician face-to-face encounter requirements with this patient on this date. The encounter with the patient was in whole or in part for the following MEDICAL CONDITION: (primary reason for [place]) MEDICAL NECESSITY: I certify, that based on my findings, NURSING services are a medically necessary home health service. HOME BOUND STATUS: I certify that my clinical findings support that this patient is homebound (i.e., Due to illness or injury, pt requires aid of supportive devices such as crutches, cane,  wheelchairs, walkers, the use of special transportation or the assistance of another person to leave their place of residence. There is a normal inability to leave the home and doing so requires considerable and taxing effort. Other absences are for medical reasons / religious services and are infrequent or of short duration when for other reasons). If current dressing causes regression in wound condition, may D/C ordered dressing product/s and apply Normal Saline Moist Dressing daily until next [place] / Other MD appointment. [place] of regression in wound condition at [phone]. Please direct any NON-WOUND related issues/requests for orders to patient's Primary Care Physician Wound #4 Right Calcaneus: [place] [facility]its - [place] *****Please Order [place] for patient***** Home Health Nurse may visit PRN to address patient s wound care needs. FACE TO FACE ENCOUNTER: MEDICARE and MEDICAID PATIENTS: I certify that this patient is under my care and that I had a face-to-face encounter that meets the physician face-to-face encounter requirements with this patient on this date. The encounter with the patient was in whole or in part for the following MEDICAL CONDITION: (primary reason for [place]) MEDICAL NECESSITY: I certify, that based on my findings, NURSING services are a medically necessary home health service. HOME Stein, Eyden ([id number]) BOUND STATUS: I certify that my clinical findings support that this patient is homebound (i.e., Due to illness or injury, pt requires aid of supportive devices such as crutches, cane, wheelchairs, walkers, the use of special transportation or the assistance of another person to leave their place of residence. There is a normal inability to leave the home and doing so requires considerable and taxing effort. Other absences are for medical reasons / religious services and are infrequent or of  short duration when for other reasons). If current dressing causes regression in wound condition, may D/C ordered dressing product/s and apply Normal Saline Moist Dressing daily until next [place] / Other MD appointment. [place] of regression in wound condition at [phone]. Please direct any NON-WOUND related issues/requests for orders to patient's Primary Care Physician Medications-please add to medication list.: Wound #2 Right,Dorsal Foot: P.O. Antibiotics Other: - Vitamin C, Zinc, Multivitamin Wound #3 Left Calcaneus: P.O. Antibiotics - Doxycycline 100mg  po BID x 7days Other: - Vitamin C, Zinc, Multivitamin Wound #4 Right Calcaneus: Santyl Enzymatic Ointment Other: - Vitamin C, Zinc, Multivitamin Radiology ordered were: X-ray, heel - bilateral heels The following medication(s) was prescribed: doxycycline hyclate oral 100 mg capsule po po capsule oral #1 the  patient has been successfully revascularized bilaterally. His forefoot is warm and his peripheral pulses in his feet are palpable. #2 drainage from the left heel is concerning. I did a culture and put him on doxycycline 100 twice a day for 7 days empirically #3 to the left heel we applied silver alginate will be [facility] by [facility] where he lives. We will continue Santyl to the right heel. #4 I have sent him for bilateral x-rays of his calcaneus to see if there are bone changes of osteomyelitis Electronic Signature(s) Signed: 06/11/2016 7:57:27 AM By: Linton Ham MD Entered By: Linton Ham on 06/10/2016 10:33:10 [name] ([id number]) -------------------------------------------------------------------------------- SuperBill Details Patient Name: [name] Date of Service: 06/10/2016 Medical Record Patient Account Number: [id number] Number: Treating RN: Ahmed Prima [birth year] (59 y.o. 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 Facility Procedures CPT4 Code: [id number] Description: [id number] - DEB SUBQ TISSUE 20 SQ CM/< ICD-10 Description Diagnosis L89.620 Pressure ulcer of left heel, unstageable Modifier: Quantity: 1 CPT4 Code: [id number] Description: [id number] - DEB SUBQ TISS EA ADDL 20CM ICD-10 Description Diagnosis L89.620 Pressure ulcer of left heel, unstageable Modifier: Quantity: 1 Physician Procedures CPT4 Code: [id number] Description: [id number] - WC PHYS SUBQ TISS 20 SQ CM ICD-10 Description Diagnosis L89.620 Pressure ulcer of left heel, unstageable Modifier: Quantity: 1 CPT4 Code: [id number] Description: [id number] - WC PHYS SUBQ TISS EA ADDL 20 CM ICD-10 Description Diagnosis L89.620 Pressure ulcer of left heel, unstageable Modifier: Quantity: 1 Electronic Signature(s) FENNER, PROBUS ([id number]) Signed: 06/11/2016 7:57:27 AM By: Linton Ham MD Entered By: Linton Ham on 06/10/2016 10:33:36

## 2016-06-11 NOTE — Progress Notes (Signed)
[name], [name] ([id number]) Visit Report for 06/10/2016 Arrival Information Details Patient Name: [name], [name] Date of Service: 06/10/2016 8:45 AM Medical Record Number: [id number] Patient Account Number: [id number] Date of Birth/Sex: 03/28/1956 (60 y.o. Male) Treating RN: Ahmed Prima Primary Care Physician: [name] Other Clinician: Referring Physician: [name] Treating Physician/Extender: [name] in Treatment: 9 Visit Information History Since Last Visit All ordered tests and consults were completed: No Patient Arrived: Wheel Chair Added or deleted any medications: No Arrival Time: 09:16 Any new allergies or adverse reactions: No Accompanied By: caregiver Had a fall or experienced change in No Transfer Assistance: [employer] activities of daily living that may affect Patient Lift risk of falls: Patient Identification Verified: Yes Signs or symptoms of abuse/neglect since last No Secondary Verification Process Yes visito Completed: Hospitalized since last visit: No Patient Requires Transmission- No Pain Present Now: No Based Precautions: Patient Has Alerts: No Electronic Signature(s) Signed: 06/10/2016 4:35:54 PM By: Alric Quan Entered By: Alric Quan on 06/10/2016 09:16:51 [name], [name] ([id number]) -------------------------------------------------------------------------------- Encounter Discharge Information Details Patient Name: [name] Date of Service: 06/10/2016 8:45 AM Medical Record Number: [id number] Patient Account Number: [id number] Date of Birth/Sex: 1955-12-22 (60 y.o. Male) Treating RN: Ahmed Prima Primary Care Physician: [name] Other Clinician: Referring Physician: [name] Treating Physician/Extender: [name] in Treatment: 9 Encounter Discharge Information Items Discharge Pain Level: 0 Discharge Condition: Stable Ambulatory Status: Wheelchair Discharge Destination: Nursing  Home Transportation: Private Auto Accompanied By: caregiver Schedule Follow-up Appointment: Yes Medication Reconciliation completed Yes and provided to Patient/Care Axten Pascucci: Provided on Clinical Summary of Care: 06/10/2016 Form Type Recipient Paper Patient DG Electronic Signature(s) Signed: 06/10/2016 10:16:06 AM By: [name] Entered By: [name] on 06/10/2016 10:16:06 [name] ([id number]) -------------------------------------------------------------------------------- Lower Extremity Assessment Details Patient Name: [name] Date of Service: 06/10/2016 8:45 AM Medical Record Number: [id number] Patient Account Number: [id number] Date of Birth/Sex: July 22, 1956 (60 y.o. Male) Treating RN: Ahmed Prima Primary Care Physician: [name] Other Clinician: Referring Physician: [name] Treating Physician/Extender: [name] in Treatment: 9 Vascular Assessment Pulses: Posterior Tibial Dorsalis Pedis Palpable: [Left:No] [Right:No] Doppler: [Left:Monophasic] [Right:Monophasic] Extremity colors, hair growth, and conditions: Temperature of Extremity: [Left:Cool] [Right:Cool] Capillary Refill: [Left:> 3 seconds] [Right:> 3 seconds] Electronic Signature(s) Signed: 06/10/2016 4:35:54 PM By: Alric Quan Entered By: Alric Quan on 06/10/2016 09:19:51 [name], [name] ([id number]) -------------------------------------------------------------------------------- Multi Wound Chart Details Patient Name: [name] Date of Service: 06/10/2016 8:45 AM Medical Record Number: [id number] Patient Account Number: [id number] Date of Birth/Sex: August 17, 1956 (60 y.o. Male) Treating RN: Carolyne Fiscal, Debi Primary Care Physician: [name] Other Clinician: Referring Physician: [name] Treating Physician/Extender: [name] in Treatment: 9 Vital Signs Height(in): 70 Pulse(bpm): 64 Weight(lbs): Blood  Pressure 110/72 (mmHg): Body Mass Index(BMI): Temperature(F): Respiratory Rate 16 (breaths/min): Photos: [2:No Photos] [3:No Photos] [4:No Photos] Wound Location: [2:Right Foot - Dorsal] [3:Left Calcaneus] [4:Right Calcaneus] Wounding Event: [2:Pressure Injury] [3:Pressure Injury] [4:Pressure Injury] Primary Etiology: [2:Pressure Ulcer] [3:Pressure Ulcer] [4:Pressure Ulcer] Comorbid History: [2:Anemia] [3:Anemia] [4:Anemia] Date Acquired: [2:02/07/2016] [3:02/07/2016] [4:02/07/2016] Weeks of Treatment: [2:9] [3:9] [4:9] Wound Status: [2:Open] [3:Open] [4:Open] Measurements L x W x D 0.6x0.8x0.1 [3:2.5x8x0.1] [4:2x5.3x0.1] (cm) Area (cm) : [2:0.377] [3:15.708] [4:8.325] Volume (cm) : [2:0.038] [3:1.571] [4:0.833] % Reduction in Area: [2:-2256.20%] [3:26.50%] [4:33.70%] % Reduction in Volume: -1800.00% [3:26.50%] [4:33.70%] Classification: [2:Category/Stage II] [3:Category/Stage II] [4:Category/Stage II] Exudate Amount: [2:Medium] [3:Small] [4:Small] Exudate Type: [2:Serosanguineous] [3:Purulent] [4:Purulent] Exudate Color: [2:red, brown] [3:yellow, brown, [place]] [4:yellow, brown, [place]] Foul Odor After [2:No] [3:Yes] [  4:Yes] Cleansing: Odor Anticipated Due to N/A [3:No] [4:No] Product Use: Wound Margin: [2:Flat and Intact] [3:Flat and Intact] [4:Flat and Intact] Granulation Amount: [2:None Present (0%)] [3:None Present (0%)] [4:None Present (0%)] Necrotic Amount: [2:Large (67-100%)] [3:Large (67-100%)] [4:Large (67-100%)] Necrotic Tissue: [2:Eschar] [3:Eschar] [4:Eschar] Exposed Structures: [2:Fascia: No Fat: No Tendon: No Muscle: No] [3:Fascia: No Fat: No Tendon: No Muscle: No] [4:Fascia: No Fat: No Tendon: No Muscle: No] Joint: No Joint: No Joint: No Bone: No Bone: No Bone: No Limited to Skin Limited to Skin Limited to Skin Breakdown Breakdown Breakdown Epithelialization: None None None Periwound Skin Texture: Edema: Yes No Abnormalities Noted No Abnormalities  Noted Periwound Skin Moist: Yes Dry/Scaly: Yes Dry/Scaly: Yes Moisture: Periwound Skin Color: No Abnormalities Noted No Abnormalities Noted No Abnormalities Noted Temperature: No Abnormality No Abnormality No Abnormality Tenderness on Yes Yes Yes Palpation: Wound Preparation: Ulcer Cleansing: Ulcer Cleansing: Ulcer Cleansing: Rinsed/Irrigated with Rinsed/Irrigated with Rinsed/Irrigated with Saline Saline Saline Topical Anesthetic Topical Anesthetic Topical Anesthetic Applied: Other: lidocaine Applied: Other: lidocaine Applied: Other: lidocaine 4% 4% 4% Treatment Notes Electronic Signature(s) Signed: 06/10/2016 4:35:54 PM By: Alric Quan Entered By: Alric Quan on 06/10/2016 09:36:29 [name], [name] ([id number]) -------------------------------------------------------------------------------- Multi-Disciplinary Care Plan Details Patient Name: [name] Date of Service: 06/10/2016 8:45 AM Medical Record Number: [id number] Patient Account Number: [id number] Date of Birth/Sex: May 11, 1956 (60 y.o. Male) Treating RN: Carolyne Fiscal, Debi Primary Care Physician: [name] Other Clinician: Referring Physician: [name] Treating Physician/Extender: [name] in Treatment: 9 Active Inactive Abuse / Safety / Falls / Self Care Management Nursing Diagnoses: Potential for falls Goals: Patient will remain injury free Date Initiated: 04/08/2016 Goal Status: Active Interventions: Assess fall risk on admission and as needed Notes: Nutrition Nursing Diagnoses: Imbalanced nutrition Goals: Patient/caregiver agrees to and verbalizes understanding of need to use nutritional supplements and/or vitamins as prescribed Date Initiated: 04/08/2016 Goal Status: Active Interventions: Assess patient nutrition upon admission and as needed per policy Notes: Orientation to the Wound Care Program Nursing Diagnoses: Knowledge deficit related to the wound healing center  program Goals: Patient/caregiver will verbalize understanding of the [place] ([id number]) Date Initiated: 04/08/2016 Goal Status: Active Interventions: Provide education on orientation to the wound center Notes: Pain, Acute or Chronic Nursing Diagnoses: Pain, acute or chronic: actual or potential Potential alteration in comfort, pain Goals: Patient will verbalize adequate pain control and receive pain control interventions during procedures as needed Date Initiated: 04/08/2016 Goal Status: Active Interventions: Assess comfort goal upon admission Complete pain assessment as per visit requirements Notes: Pressure Nursing Diagnoses: Knowledge deficit related to causes and risk factors for pressure ulcer development Knowledge deficit related to management of pressures ulcers Goals: Patient will remain free from development of additional pressure ulcers Date Initiated: 04/08/2016 Goal Status: Active Interventions: Assess offloading mechanisms upon admission and as needed Assess potential for pressure ulcer upon admission and as needed Notes: Wound/Skin Impairment Nursing Diagnoses: Impaired tissue integrity [name], [name] ([id number]) Goals: Ulcer/skin breakdown will have a volume reduction of 30% by week 4 Date Initiated: 04/08/2016 Goal Status: Active Ulcer/skin breakdown will have a volume reduction of 50% by week 8 Date Initiated: 04/08/2016 Goal Status: Active Ulcer/skin breakdown will have a volume reduction of 80% by week 12 Date Initiated: 04/08/2016 Goal Status: Active Interventions: Assess ulceration(s) every visit Notes: Electronic Signature(s) Signed: 06/10/2016 4:35:54 PM By: Alric Quan Entered By: Alric Quan on 06/10/2016 09:36:23 [name], [name] ([id number]) -------------------------------------------------------------------------------- Pain Assessment Details Patient Name: [name] Date of Service: 06/10/2016  8:45  AM Medical Record Number: [id number] Patient Account Number: [id number] Date of Birth/Sex: 10/09/1956 (60 y.o. Male) Treating RN: Carolyne Fiscal, Debi Primary Care Physician: [name] Other Clinician: Referring Physician: [name] Treating Physician/Extender: [name] in Treatment: 9 Active Problems Location of Pain Severity and Description of Pain Patient Has Paino No Site Locations With Dressing Change: No Pain Management and Medication Current Pain Management: Electronic Signature(s) Signed: 06/10/2016 4:35:54 PM By: Alric Quan Entered By: Alric Quan on 06/10/2016 09:16:59 [name], [name] ([id number]) -------------------------------------------------------------------------------- Patient/Caregiver Education Details Patient Name: [name] Date of Service: 06/10/2016 8:45 AM Medical Record Number: [id number] Patient Account Number: [id number] Date of Birth/Gender: 1956-04-07 (60 y.o. Male) Treating RN: Carolyne Fiscal, Debi Primary Care Physician: [name] Other Clinician: Referring Physician: [name] Treating Physician/Extender: [name] in Treatment: 9 Education Assessment Education Provided To: Patient and Caregiver Education Topics Provided Wound/Skin Impairment: Handouts: Other: change dressing as ordered Methods: Demonstration, Explain/Verbal Responses: State content correctly Electronic Signature(s) Signed: 06/10/2016 4:35:54 PM By: Alric Quan Entered By: Alric Quan on 06/10/2016 09:37:15 [name], [name] ([id number]) -------------------------------------------------------------------------------- Wound Assessment Details Patient Name: [name] Date of Service: 06/10/2016 8:45 AM Medical Record Number: [id number] Patient Account Number: [id number] Date of Birth/Sex: 18-Sep-1956 (60 y.o. Male) Treating RN: Carolyne Fiscal, Debi Primary Care Physician: [name] Other Clinician: Referring  Physician: [name] Treating Physician/Extender: [name] in Treatment: 9 Wound Status Wound Number: 2 Primary Etiology: Pressure Ulcer Wound Location: Right Foot - Dorsal Wound Status: Open Wounding Event: Pressure Injury Comorbid History: Anemia Date Acquired: 02/07/2016 Weeks Of Treatment: 9 Clustered Wound: No Photos Photo Uploaded By: Alric Quan on 06/10/2016 13:12:22 Wound Measurements Length: (cm) 0.6 Width: (cm) 0.8 Depth: (cm) 0.1 Area: (cm) 0.377 Volume: (cm) 0.038 % Reduction in Area: -2256.2% % Reduction in Volume: -1800% Epithelialization: None Undermining: No Wound Description Classification: Category/Stage II Wound Margin: Flat and Intact Exudate Amount: Medium Exudate Type: Serosanguineous Exudate Color: red, brown Foul Odor After Cleansing: No Wound Bed Granulation Amount: None Present (0%) Exposed Structure Necrotic Amount: Large (67-100%) Fascia Exposed: No Necrotic Quality: Eschar Fat Layer Exposed: No Tendon Exposed: No [name], [name] ([id number]) Muscle Exposed: No Joint Exposed: No Bone Exposed: No Limited to Skin Breakdown Periwound Skin Texture Texture Color No Abnormalities Noted: No No Abnormalities Noted: No Localized Edema: Yes Temperature / Pain Moisture Temperature: No Abnormality No Abnormalities Noted: No Tenderness on Palpation: Yes Moist: Yes Wound Preparation Ulcer Cleansing: Rinsed/Irrigated with Saline Topical Anesthetic Applied: Other: lidocaine 4%, Treatment Notes Wound #2 (Right, Dorsal Foot) 1. Cleansed with: Clean wound with Normal Saline 2. Anesthetic Topical Lidocaine 4% cream to wound bed prior to debridement 4. Dressing Applied: Foam 5. Secondary Dressing Applied Kerlix/Conform 7. Secured with Tape Notes netting Electronic Signature(s) Signed: 06/10/2016 4:35:54 PM By: Alric Quan Entered By: Alric Quan on 06/10/2016 09:26:26 [name], [name]  ([id number]) -------------------------------------------------------------------------------- Wound Assessment Details Patient Name: [name] Date of Service: 06/10/2016 8:45 AM Medical Record Number: [id number] Patient Account Number: [id number] Date of Birth/Sex: 1956-05-11 (60 y.o. Male) Treating RN: Ahmed Prima Primary Care Physician: [name] Other Clinician: Referring Physician: [name] Treating Physician/Extender: [name] in Treatment: 9 Wound Status Wound Number: 3 Primary Etiology: Pressure Ulcer Wound Location: Left Calcaneus Wound Status: Open Wounding Event: Pressure Injury Comorbid History: Anemia Date Acquired: 02/07/2016 Weeks Of Treatment: 9 Clustered Wound: No Photos Photo Uploaded By: Alric Quan on 06/10/2016 13:13:00 Wound Measurements Length: (cm) 2.5 Width: (cm) 8 Depth: (cm) 0.1 Area: (cm) 15.708 Volume: (cm) 1.571 %  Reduction in Area: 26.5% % Reduction in Volume: 26.5% Epithelialization: None Tunneling: No Undermining: No Wound Description Classification: Category/Stage II Wound Margin: Flat and Intact Exudate Amount: Small Exudate Type: Purulent Exudate Color: yellow, brown, [place] Foul Odor After Cleansing: Yes Due to Product Use: No Wound Bed Granulation Amount: None Present (0%) Exposed Structure Necrotic Amount: Large (67-100%) Fascia Exposed: No Necrotic Quality: Eschar Fat Layer Exposed: No Tendon Exposed: No [name], [name] ([id number]) Muscle Exposed: No Joint Exposed: No Bone Exposed: No Limited to Skin Breakdown Periwound Skin Texture Texture Color No Abnormalities Noted: No No Abnormalities Noted: No Moisture Temperature / Pain No Abnormalities Noted: No Temperature: No Abnormality Dry / Scaly: Yes Tenderness on Palpation: Yes Wound Preparation Ulcer Cleansing: Rinsed/Irrigated with Saline Topical Anesthetic Applied: Other: lidocaine 4%, Treatment Notes Wound #3 (Left  Calcaneus) 1. Cleansed with: Clean wound with Normal Saline 2. Anesthetic Topical Lidocaine 4% cream to wound bed prior to debridement 4. Dressing Applied: Aquacel Ag 5. Secondary Dressing Applied ABD Pad Dry Gauze Kerlix/Conform 7. Secured with Tape Notes netting Electronic Signature(s) Signed: 06/10/2016 4:35:54 PM By: Alric Quan Entered By: Alric Quan on 06/10/2016 09:28:26 [name], [name] ([id number]) -------------------------------------------------------------------------------- Wound Assessment Details Patient Name: [name] Date of Service: 06/10/2016 8:45 AM Medical Record Number: [id number] Patient Account Number: [id number] Date of Birth/Sex: Sep 17, 1956 (60 y.o. Male) Treating RN: Ahmed Prima Primary Care Physician: [name] Other Clinician: Referring Physician: [name] Treating Physician/Extender: [name] in Treatment: 9 Wound Status Wound Number: 4 Primary Etiology: Pressure Ulcer Wound Location: Right Calcaneus Wound Status: Open Wounding Event: Pressure Injury Comorbid History: Anemia Date Acquired: 02/07/2016 Weeks Of Treatment: 9 Clustered Wound: No Photos Photo Uploaded By: Alric Quan on 06/10/2016 13:13:18 Wound Measurements Length: (cm) 2 Width: (cm) 5.3 Depth: (cm) 0.1 Area: (cm) 8.325 Volume: (cm) 0.833 % Reduction in Area: 33.7% % Reduction in Volume: 33.7% Epithelialization: None Tunneling: No Undermining: No Wound Description Classification: Category/Stage II Wound Margin: Flat and Intact Exudate Amount: Small Exudate Type: Purulent Exudate Color: yellow, brown, [place] Foul Odor After Cleansing: Yes Due to Product Use: No Wound Bed Granulation Amount: None Present (0%) Exposed Structure Necrotic Amount: Large (67-100%) Fascia Exposed: No Necrotic Quality: Eschar Fat Layer Exposed: No Tendon Exposed: No [place], [place] ([id number]) Muscle Exposed: No Joint Exposed: No Bone  Exposed: No Limited to Skin Breakdown Periwound Skin Texture Texture Color No Abnormalities Noted: No No Abnormalities Noted: No Moisture Temperature / Pain No Abnormalities Noted: No Temperature: No Abnormality Dry / Scaly: Yes Tenderness on Palpation: Yes Wound Preparation Ulcer Cleansing: Rinsed/Irrigated with Saline Topical Anesthetic Applied: Other: lidocaine 4%, Treatment Notes Wound #4 (Right Calcaneus) 1. Cleansed with: Clean wound with Normal Saline 2. Anesthetic Topical Lidocaine 4% cream to wound bed prior to debridement 4. Dressing Applied: Santyl Ointment 5. Secondary Dressing Applied ABD Pad Dry Gauze Kerlix/Conform 7. Secured with Tape Notes netting Electronic Signature(s) Signed: 06/10/2016 4:35:54 PM By: Alric Quan Entered By: Alric Quan on 06/10/2016 09:28:15 [name] ([id number]) -------------------------------------------------------------------------------- Vitals Details Patient Name: [name] Date of Service: 06/10/2016 8:45 AM Medical Record Number: [id number] Patient Account Number: [id number] Date of Birth/Sex: October 27, 1956 (60 y.o. Male) Treating RN: Ahmed Prima Primary Care Physician: [name] Other Clinician: Referring Physician: [name] Treating Physician/Extender: [name] in Treatment: 9 Vital Signs Time Taken: 09:17 Pulse (bpm): 64 Height (in): 70 Respiratory Rate (breaths/min): 16 Blood Pressure (mmHg): 110/72 Reference Range: 80 - 120 mg / dl Electronic Signature(s) Signed: 06/10/2016 4:35:54 PM By: Alric Quan Entered  By: Alric Quan on 06/10/2016 [id number]

## 2016-06-15 LAB — AEROBIC CULTURE W GRAM STAIN (SUPERFICIAL SPECIMEN)

## 2016-06-15 LAB — AEROBIC CULTURE  (SUPERFICIAL SPECIMEN)

## 2016-06-17 ENCOUNTER — Encounter: Payer: Medicare Other | Admitting: Surgery

## 2016-06-17 DIAGNOSIS — L89610 Pressure ulcer of right heel, unstageable: Secondary | ICD-10-CM | POA: Diagnosis not present

## 2016-06-17 NOTE — Progress Notes (Signed)
SHEPPARD, MCCLAMMY ([id number]) Visit Report for 06/17/2016 Arrival Information Details Patient Name: [name], [name] Date of Service: 06/17/2016 10:45 AM Medical Record Number: [id number] Patient Account Number: [id number] Date of Birth/Sex: 04/28/1956 (60 y.o. Male) Treating RN: [name] Primary Care Physician: [name] Other Clinician: Referring Physician: [name] Treating Physician/Extender: Frann Rider in Treatment: 10 Visit Information History Since Last Visit Added or deleted any medications: No Patient Arrived: Wheel Chair Any new allergies or adverse reactions: No Arrival Time: 10:57 Had a fall or experienced change in No activities of daily living that may affect Accompanied By: staff risk of falls: Transfer Assistance: Manual Signs or symptoms of abuse/neglect since last No Patient Identification Verified: Yes visito Secondary Verification Process Yes Hospitalized since last visit: No Completed: Pain Present Now: No Patient Requires Transmission-Based No Precautions: Patient Has Alerts: No Electronic Signature(s) Signed: 06/17/2016 4:35:40 PM By: [name] Entered By: [name] on 06/17/2016 10:57:25 Seavey, [name] ([id number]) -------------------------------------------------------------------------------- Encounter Discharge Information Details Patient Name: [name] Date of Service: 06/17/2016 10:45 AM Medical Record Number: [id number] Patient Account Number: [id number] Date of Birth/Sex: [birth year] (60 y.o. Male) Treating RN: [name] Primary Care Physician: [name] Other Clinician: Referring Physician: [name] Treating Physician/Extender: Frann Rider in Treatment: 10 Encounter Discharge Information Items Discharge Pain Level: 0 Discharge Condition: Stable Ambulatory Status: Wheelchair Discharge Destination: Home Transportation: Private Auto Accompanied By: staff Schedule Follow-up Appointment:  Yes Medication Reconciliation completed No and provided to Patient/Care Darci Lykins: Provided on Clinical Summary of Care: 06/17/2016 Form Type Recipient Paper Patient DG Electronic Signature(s) Signed: 06/17/2016 11:30:45 AM By: [name] Entered By: [name] on 06/17/2016 11:30:44 [name] ([id number]) -------------------------------------------------------------------------------- Lower Extremity Assessment Details Patient Name: [name] Date of Service: 06/17/2016 10:45 AM Medical Record Number: [id number] Patient Account Number: [id number] Date of Birth/Sex: June 14, 1956 (60 y.o. Male) Treating RN: [name] Primary Care Physician: [name] Other Clinician: Referring Physician: [name] Treating Physician/Extender: Frann Rider in Treatment: 10 Vascular Assessment Pulses: Posterior Tibial Dorsalis Pedis Palpable: [Left:Yes] [Right:Yes] Doppler: [Left:Monophasic] [Right:Monophasic] Extremity colors, hair growth, and conditions: Capillary Refill: [Left:> 3 seconds] [Right:> 3 seconds] Toe Nail Assessment Left: Right: Thick: Yes Yes Discolored: No No Deformed: No No Improper Length and Hygiene: Yes Yes Electronic Signature(s) Signed: 06/17/2016 4:35:40 PM By: [name] Entered By: [name] on 06/17/2016 11:05:52 Narducci, [name] ([id number]) -------------------------------------------------------------------------------- Multi Wound Chart Details Patient Name: [name] Date of Service: 06/17/2016 10:45 AM Medical Record Number: [id number] Patient Account Number: [id number] Date of Birth/Sex: 12/18/1955 (60 y.o. Male) Treating RN: [name] Primary Care Physician: [name] Other Clinician: Referring Physician: [name] Treating Physician/Extender: Frann Rider in Treatment: 10 Vital Signs Height(in): 70 Pulse(bpm): 68 Weight(lbs): Blood Pressure 94/68 (mmHg): Body Mass  Index(BMI): Temperature(F): Respiratory Rate 16 (breaths/min): Photos: [2:No Photos] [3:No Photos] [4:No Photos] Wound Location: [2:Right, Dorsal Foot] [3:Left Calcaneus] [4:Right Calcaneus] Wounding Event: [2:Pressure Injury] [3:Pressure Injury] [4:Pressure Injury] Primary Etiology: [2:Pressure Ulcer] [3:Pressure Ulcer] [4:Pressure Ulcer] Comorbid History: [2:N/A] [3:Anemia] [4:Anemia] Date Acquired: [2:02/07/2016] [3:02/07/2016] [4:02/07/2016] Weeks of Treatment: [2:10] [3:10] [4:10] Wound Status: [2:Healed - Epithelialized] [3:Open] [4:Open] Measurements L x W x D 0x0x0 [3:2x6.6x0.2] [4:2.3x5.5x0.1] (cm) Area (cm) : [2:0] [3:10.367] [4:9.935] Volume (cm) : [2:0] [3:2.073] [4:0.994] % Reduction in Area: [2:100.00%] [3:51.50%] [4:20.90%] % Reduction in Volume: 100.00% [3:2.90%] [4:20.90%] Classification: [2:Category/Stage II] [3:Category/Stage II] [4:Category/Stage II] Exudate Amount: [2:N/A] [3:Large] [4:Large] Exudate Type: [2:N/A] [3:Purulent] [4:Purulent] Exudate Color: [2:N/A] [3:yellow, brown, Brand] [4:yellow, brown, Mcmorris] Foul Odor After [2:N/A] [3:Yes] [4:Yes]  Cleansing: Odor Anticipated Due to N/A [3:No] [4:No] Product Use: Wound Margin: [2:N/A] [3:Flat and Intact] [4:Flat and Intact] Granulation Amount: [2:N/A] [3:None Present (0%)] [4:None Present (0%)] Necrotic Amount: [2:N/A] [3:Large (67-100%)] [4:Large (67-100%)] Necrotic Tissue: [2:N/A] [3:Eschar, Adherent Slough] [4:Eschar] Epithelialization: [2:N/A] [3:None] [4:None] Periwound Skin Texture: No Abnormalities Noted [2:No Abnormalities Noted] [3:No Abnormalities Noted Dry/Scaly: Yes] [4:No Abnormalities Noted Dry/Scaly: Yes] Periwound Skin Moisture: Periwound Skin Color: No Abnormalities Noted No Abnormalities Noted No Abnormalities Noted Temperature: N/A No Abnormality No Abnormality Tenderness on No Yes Yes Palpation: Wound Preparation: N/A Ulcer Cleansing: Ulcer Cleansing: Rinsed/Irrigated with  Rinsed/Irrigated with Saline Saline Topical Anesthetic Topical Anesthetic Applied: Other: lidocaine Applied: Other: lidocaine 4% 4% Treatment Notes Electronic Signature(s) Signed: 06/17/2016 4:35:40 PM By: [name] Entered By: [name] on 06/17/2016 11:06:14 Kevorkian, [name] ([id number]) -------------------------------------------------------------------------------- Multi-Disciplinary Care Plan Details Patient Name: [name] Date of Service: 06/17/2016 10:45 AM Medical Record Number: [id number] Patient Account Number: [id number] Date of Birth/Sex: June 18, 1956 (60 y.o. Male) Treating RN: [name] Primary Care Physician: [name] Other Clinician: Referring Physician: [name] Treating Physician/Extender: Frann Rider in Treatment: 10 Active Inactive Abuse / Safety / Falls / Self Care Management Nursing Diagnoses: Potential for falls Goals: Patient will remain injury free Date Initiated: 04/08/2016 Goal Status: Active Interventions: Assess fall risk on admission and as needed Notes: Nutrition Nursing Diagnoses: Imbalanced nutrition Goals: Patient/caregiver agrees to and verbalizes understanding of need to use nutritional supplements and/or vitamins as prescribed Date Initiated: 04/08/2016 Goal Status: Active Interventions: Assess patient nutrition upon admission and as needed per policy Notes: Orientation to the Wound Care Program Nursing Diagnoses: Knowledge deficit related to the wound healing center program Goals: Patient/caregiver will verbalize understanding of the Falls ([id number]) Date Initiated: 04/08/2016 Goal Status: Active Interventions: Provide education on orientation to the wound center Notes: Pain, Acute or Chronic Nursing Diagnoses: Pain, acute or chronic: actual or potential Potential alteration in comfort, pain Goals: Patient will verbalize adequate pain control and receive  pain control interventions during procedures as needed Date Initiated: 04/08/2016 Goal Status: Active Interventions: Assess comfort goal upon admission Complete pain assessment as per visit requirements Notes: Pressure Nursing Diagnoses: Knowledge deficit related to causes and risk factors for pressure ulcer development Knowledge deficit related to management of pressures ulcers Goals: Patient will remain free from development of additional pressure ulcers Date Initiated: 04/08/2016 Goal Status: Active Interventions: Assess offloading mechanisms upon admission and as needed Assess potential for pressure ulcer upon admission and as needed Notes: Wound/Skin Impairment Nursing Diagnoses: Impaired tissue integrity Folks, Ayvion ([id number]) Goals: Ulcer/skin breakdown will have a volume reduction of 30% by week 4 Date Initiated: 04/08/2016 Goal Status: Active Ulcer/skin breakdown will have a volume reduction of 50% by week 8 Date Initiated: 04/08/2016 Goal Status: Active Ulcer/skin breakdown will have a volume reduction of 80% by week 12 Date Initiated: 04/08/2016 Goal Status: Active Interventions: Assess ulceration(s) every visit Notes: Electronic Signature(s) Signed: 06/17/2016 4:35:40 PM By: [name] Entered By: [name] on 06/17/2016 11:06:04 Archey, [name] ([id number]) -------------------------------------------------------------------------------- Pain Assessment Details Patient Name: [name] Date of Service: 06/17/2016 10:45 AM Medical Record Number: [id number] Patient Account Number: [id number] Date of Birth/Sex: [birth year] (60 y.o. Male) Treating RN: [name] Primary Care Physician: [name] Other Clinician: Referring Physician: [name] Treating Physician/Extender: Frann Rider in Treatment: 10 Active Problems Location of Pain Severity and Description of Pain Patient Has Paino No Site Locations Pain Management and  Medication Current Pain Management: Notes Topical  or injectable lidocaine is offered to patient for acute pain when surgical debridement is performed. If needed, Patient is instructed to use over the counter pain medication for the following 24-48 hours after debridement. Wound care MDs do not prescribed pain medications. Patient has chronic pain or uncontrolled pain. Patient has been instructed to make an appointment with their Primary Care Physician for pain management. Electronic Signature(s) Signed: 06/17/2016 4:35:40 PM By: [name] Entered By: [name] on 06/17/2016 11:00:19 [name] ([id number]) -------------------------------------------------------------------------------- Patient/Caregiver Education Details Patient Name: [name] Date of Service: 06/17/2016 10:45 AM Medical Record Number: [id number] Patient Account Number: [id number] Date of Birth/Gender: 05-14-1956 (60 y.o. Male) Treating RN: [name] Primary Care Physician: [name] Other Clinician: Referring Physician: [name] Treating Physician/Extender: Frann Rider in Treatment: 10 Education Assessment Education Provided To: Patient and Caregiver Education Topics Provided Medication Safety: Handouts: Other: new antibiotic Methods: Explain/Verbal Responses: State content correctly Electronic Signature(s) Signed: 06/17/2016 4:35:40 PM By: [name] Entered By: [name] on 06/17/2016 11:21:57 Demattia, [name] ([id number]) -------------------------------------------------------------------------------- Wound Assessment Details Patient Name: [name] Date of Service: 06/17/2016 10:45 AM Medical Record Number: [id number] Patient Account Number: [id number] Date of Birth/Sex: 16-Nov-1955 (60 y.o. Male) Treating RN: [name] Primary Care Physician: [name] Other Clinician: Referring Physician: [name] Treating Physician/Extender: Frann Rider in Treatment: 10 Wound Status Wound Number: 2 Primary Etiology: Pressure Ulcer Wound Location: Right, Dorsal Foot Wound Status: Healed - Epithelialized Wounding Event: Pressure Injury Date Acquired: 02/07/2016 Weeks Of Treatment: 10 Clustered Wound: No Wound Measurements Length: (cm) 0 % Reduction Width: (cm) 0 % Reduction Depth: (cm) 0 Area: (cm) 0 Volume: (cm) 0 in Area: 100% in Volume: 100% Wound Description Classification: Category/Stage II Periwound Skin Texture Texture Color No Abnormalities Noted: No No Abnormalities Noted: No Moisture No Abnormalities Noted: No Electronic Signature(s) Signed: 06/17/2016 4:35:40 PM By: [name] Entered By: [name] on 06/17/2016 11:03:49 Gowans, Ndrew ([id number]) -------------------------------------------------------------------------------- Wound Assessment Details Patient Name: [name] Date of Service: 06/17/2016 10:45 AM Medical Record Number: [id number] Patient Account Number: [id number] Date of Birth/Sex: 1956-08-08 (60 y.o. Male) Treating RN: [name] Primary Care Physician: [name] Other Clinician: Referring Physician: [name] Treating Physician/Extender: Frann Rider in Treatment: 10 Wound Status Wound Number: 3 Primary Etiology: Pressure Ulcer Wound Location: Left Calcaneus Wound Status: Open Wounding Event: Pressure Injury Comorbid History: Anemia Date Acquired: 02/07/2016 Weeks Of Treatment: 10 Clustered Wound: No Photos Wound Measurements Length: (cm) 2 Width: (cm) 6.6 Depth: (cm) 0.2 Area: (cm) 10.367 Volume: (cm) 2.073 % Reduction in Area: 51.5% % Reduction in Volume: 2.9% Epithelialization: None Tunneling: No Undermining: No Wound Description Classification: Category/Stage II Foul Odor Afte Wound Margin: Flat and Intact Due to Product Exudate Amount: Large Exudate Type: Purulent Exudate Color: yellow, brown, Mcnamee r Cleansing:  Yes Use: No Wound Bed Granulation Amount: None Present (0%) Exposed Structure Necrotic Amount: Large (67-100%) Fascia Exposed: No Necrotic Quality: Eschar, Adherent Slough Fat Layer Exposed: No Tendon Exposed: No Muscle Exposed: No Mareno, Jhamir ([id number]) Joint Exposed: No Bone Exposed: No Limited to Skin Breakdown Periwound Skin Texture Texture Color No Abnormalities Noted: No No Abnormalities Noted: No Moisture Temperature / Pain No Abnormalities Noted: No Temperature: No Abnormality Dry / Scaly: Yes Tenderness on Palpation: Yes Wound Preparation Ulcer Cleansing: Rinsed/Irrigated with Saline Topical Anesthetic Applied: Other: lidocaine 4%, Treatment Notes Wound #3 (Left Calcaneus) 1. Cleansed with: Clean wound with Normal Saline 2. Anesthetic Topical Lidocaine 4% cream to wound bed prior to debridement  4. Dressing Applied: Santyl Ointment 5. Secondary Dressing Applied Foam Guaze, ABD and kerlix/Conform 7. Secured with Tape Notes foam to top of foot for protection Electronic Signature(s) Signed: 06/17/2016 4:35:40 PM By: [name] Entered By: [name] on 06/17/2016 11:15:09 Arauz, Hasson ([id number]) -------------------------------------------------------------------------------- Wound Assessment Details Patient Name: [name] Date of Service: 06/17/2016 10:45 AM Medical Record Number: [id number] Patient Account Number: [id number] Date of Birth/Sex: 1956/08/25 (60 y.o. Male) Treating RN: [name] Primary Care Physician: [name] Other Clinician: Referring Physician: [name] Treating Physician/Extender: Frann Rider in Treatment: 10 Wound Status Wound Number: 4 Primary Etiology: Pressure Ulcer Wound Location: Right Calcaneus Wound Status: Open Wounding Event: Pressure Injury Comorbid History: Anemia Date Acquired: 02/07/2016 Weeks Of Treatment: 10 Clustered Wound: No Photos Wound Measurements Length: (cm)  2.3 Width: (cm) 5.5 Depth: (cm) 0.1 Area: (cm) 9.935 Volume: (cm) 0.994 % Reduction in Area: 20.9% % Reduction in Volume: 20.9% Epithelialization: None Tunneling: No Undermining: No Wound Description Classification: Category/Stage II Foul Odor Afte Wound Margin: Flat and Intact Due to Product Exudate Amount: Large Exudate Type: Purulent Exudate Color: yellow, brown, Moffa r Cleansing: Yes Use: No Wound Bed Granulation Amount: None Present (0%) Exposed Structure Necrotic Amount: Large (67-100%) Fascia Exposed: No Necrotic Quality: Eschar Fat Layer Exposed: No Tendon Exposed: No Muscle Exposed: No Wyffels, Maxie ([id number]) Joint Exposed: No Bone Exposed: No Limited to Skin Breakdown Periwound Skin Texture Texture Color No Abnormalities Noted: No No Abnormalities Noted: No Moisture Temperature / Pain No Abnormalities Noted: No Temperature: No Abnormality Dry / Scaly: Yes Tenderness on Palpation: Yes Wound Preparation Ulcer Cleansing: Rinsed/Irrigated with Saline Topical Anesthetic Applied: Other: lidocaine 4%, Treatment Notes Wound #4 (Right Calcaneus) 1. Cleansed with: Clean wound with Normal Saline 2. Anesthetic Topical Lidocaine 4% cream to wound bed prior to debridement 4. Dressing Applied: Santyl Ointment 5. Secondary Dressing Applied Foam Guaze, ABD and kerlix/Conform 7. Secured with Tape Notes foam to top of foot for protection Electronic Signature(s) Signed: 06/17/2016 4:35:40 PM By: [name] Entered By: [name] on 06/17/2016 11:15:41 [name] ([id number]) -------------------------------------------------------------------------------- Vitals Details Patient Name: [name] Date of Service: 06/17/2016 10:45 AM Medical Record Number: [id number] Patient Account Number: [id number] Date of Birth/Sex: 26-Jun-1956 (60 y.o. Male) Treating RN: [name] Primary Care Physician: [name] Other Clinician: Referring  Physician: [name] Treating Physician/Extender: Frann Rider in Treatment: 10 Vital Signs Time Taken: 11:00 Pulse (bpm): 68 Height (in): 70 Respiratory Rate (breaths/min): 16 Blood Pressure (mmHg): 94/68 Reference Range: 80 - 120 mg / dl Electronic Signature(s) Signed: 06/17/2016 4:35:40 PM By: [name] Entered By: [name] on 06/17/2016 11:00:43

## 2016-06-17 NOTE — Progress Notes (Addendum)
[name], [name] ([name]) Visit Report for 06/17/2016 Chief Complaint Document Details Patient Name: [name] Date of Service: 06/17/2016 10:45 AM Medical Record Number: [name] Patient Account Number: [id number] Date of Birth/Sex: [birth year] (60 y.o. Male) Treating RN: [name] Primary Care Physician: [name] Other Clinician: Referring Physician: Threasa Hughes Treating Physician/Extender: [name] in Treatment: 10 Information Obtained from: Patient Chief Complaint The patient is here for wounds on his bilateral feet supposedly obtained during a hospitalization from 3/24 through 4/6 Electronic Signature(s) Signed: 06/17/2016 11:21:44 AM By: Larry Fudge MD, FACS Entered By: [name] on 06/17/2016 11:21:43 [name] ([name]) -------------------------------------------------------------------------------- Debridement Details Patient Name: [name] Date of Service: 06/17/2016 10:45 AM Medical Record Number: [name] Patient Account Number: [id number] Date of Birth/Sex: [birth year]Hughes (60 y.o. Male) Treating RN: [name] Primary Care Physician: [name] Other Clinician: Referring Physician: Threasa Hughes Treating Physician/Extender: [name] in Treatment: 10 Debridement Performed for Wound #3 Left Calcaneus Assessment: Performed By: Physician Larry Fudge, MD Debridement: Debridement Pre-procedure Yes Verification/Time Out Taken: Start Time: 11:17 Pain Control: Lidocaine 4% Topical Solution Level: Skin/Subcutaneous Tissue Total Area Debrided (L x 2 (cm) x 6.6 (cm) = 13.2 (cm) W): Tissue and other Viable, Non-Viable, Eschar, Fibrin/Slough, Subcutaneous material debrided: Instrument: Forceps, Scissors Bleeding: Minimum Hemostasis Achieved: Pressure End Time: 11:19 Procedural Pain: 0 Post Procedural Pain: 0 Response to Treatment: Procedure was tolerated well Post Debridement Measurements of Total  Wound Length: (cm) 2 Stage: Category/Stage II Width: (cm) 6.6 Depth: (cm) 0.2 Volume: (cm) 2.073 Post Procedure Diagnosis Same as Pre-procedure Electronic Signature(s) Signed: 06/17/2016 11:21:24 AM By: Larry Fudge MD, FACS Signed: 06/17/2016 4:35:40 PM By: [name] Entered By: [name] on 06/17/2016 11:21:24 [name], [name] ([name]) -------------------------------------------------------------------------------- Debridement Details Patient Name: [name] Date of Service: 06/17/2016 10:45 AM Medical Record Number: [name] Patient Account Number: [id number] Date of Birth/Sex: [name] (60 y.o. Male) Treating RN: [name] Primary Care Physician: [name] Other Clinician: Referring Physician: Threasa Hughes Treating Physician/Extender: [name] in Treatment: 10 Debridement Performed for Wound #4 Right Calcaneus Assessment: Performed By: Physician Larry Fudge, MD Debridement: Debridement Pre-procedure Yes Verification/Time Out Taken: Start Time: 11:14 Pain Control: Lidocaine 4% Topical Solution Level: Skin/Subcutaneous Tissue Total Area Debrided (L x 2.3 (cm) x 5.5 (cm) = 12.65 (cm) W): Tissue and other Viable, Non-Viable, Eschar, Fibrin/Slough, Subcutaneous material debrided: Instrument: Forceps, Scissors Bleeding: Minimum Hemostasis Achieved: Pressure End Time: 11:17 Procedural Pain: 0 Post Procedural Pain: 0 Response to Treatment: Procedure was tolerated well Post Debridement Measurements of Total Wound Length: (cm) 2.3 Stage: Category/Stage II Width: (cm) 5.5 Depth: (cm) 0.2 Volume: (cm) 1.987 Post Procedure Diagnosis Same as Pre-procedure Electronic Signature(s) Signed: 06/17/2016 11:21:36 AM By: Larry Fudge MD, FACS Signed: 06/17/2016 4:35:40 PM By: [name] Entered By: [name] on 06/17/2016 11:21:35 [name]  ([name]) -------------------------------------------------------------------------------- HPI Details Patient Name: [name] Date of Service: 06/17/2016 10:45 AM Medical Record Number: [name] Patient Account Number: [id number] Date of Birth/Sex: [name] (60 y.o. Male) Treating RN: [name] Primary Care Physician: [name] Other Clinician: Referring Physician: Threasa Hughes Treating Physician/Extender: [name] in Treatment: 10 History of Present Illness HPI Description: 04/08/16; this is a patient we really don't know too much about. He was hospitalized from 3/24 through 4/6 residing with a sodium of 167 creatinine of 2.49 right lower lobe pneumonia sepsis/septic shock syndrome. He apparently came back to [facility] He apparently had been there only 2 weeks before  he went out. I know very little about him premorbidly. The hospital he had a CT scan of the head that showed atrophy and chronic small vessel white matter ischemic changes and remote infarcts also noted within the bilateral basal ganglia. His echocardiogram and presentation showed a EF of 20-25%, left ventricle was moderately dilated with diffuse hypokinesis. Apparently he return to his assisted living with wounds on his bilateral feet. According to the attendant came with him he could walk before he went out he could no longer walk now. There are wounds on both heels covered with a black eschar and also wounds on his bilateral dorsal feet. ABIs calculated in our clinic for 0.49 bilaterally. He was a smoker up to 2 months ago he is not smoking now. 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 Monophasic waves distally bilaterally. He is going back at the end of the  month I believe June 29 probably for an arteriogram as arranged by Dr. Delana Meyer in the meantime he has had no real changes 04/30/16; we are waiting vascular review on June 29. The black eschar over his bilateral heels is beginning to separate using Santyl. The areas on the dorsal aspect of his feet looked considerably better/resolved. He apparently has no complaints. He also has a severe presumably ischemic cardiomyopathy as noted above 05/21/16:pt returns today for ongoing evaluation of wounds. I reviewed recent venous and arterial diagnostic findings. He is scheduled for a LLE angio on 05/26/16 and RLE angio on 06/02/16 for advanced evaluation of PAD as noted per lower extremity arterial duplex. BLE venous duplex was negative for venous reflux, DVT or insufficiency. 06/10/16; this is a patient I have not seen in over a month although he was seen here on 7/12. When I admitted him to our clinic I felt he had pressure ulcerations but also potential for limb threatening ischemia. I referred him urgently to vascular surgery. Since we have last seen him in this clinic he has undergone 2 procedures. On 7/17 he will underwent percutaneous transluminal angioplasty of the left above-knee popliteal artery as well as the mid to distal SFA. On 7/27 he underwent for cutaneous transluminal angioplasty of the right popliteal artery and SFA an angioplasty of the proximal SFA. He had a stent placement in the right popliteal artery. The patient is difficult to gauge symptomatology although he does not currently complain of pain. Our intake nurse reports purulent drainage out of the left heel and an odor. 06/17/2016 -- right heel x-ray and left heel x-ray -- IMPRESSION:No radiographic evidence of osteomyelitis. Soft tissue changes are consistent with cellulitis. He had a wound culture which is positive for abundant Citrobacter fundi and pseudomonas aeruginosa. [place], [place] ([name]) Both are sensitive to  ciprofloxacin and at the present time the patient is on doxycycline 100 mg, prescribed by Dr. Dellia Nims last week. I will prescribe ciprofloxacin for him today. Electronic Signature(s) Signed: 06/17/2016 11:21:50 AM By: Larry Fudge MD, FACS Previous Signature: 06/17/2016 11:20:46 AM Version By: Larry Fudge MD, FACS Previous Signature: 06/17/2016 11:06:34 AM Version By: Larry Fudge MD, FACS Entered By: [name] on 06/17/2016 11:21:50 [name] ([name]) -------------------------------------------------------------------------------- Physical Exam Details Patient Name: [name] Date of Service: 06/17/2016 10:45 AM Medical Record Number: [name] Patient Account Number: [id number] Date of Birth/Sex: [birth year] (60 y.o. Male) Treating RN: [name] Primary Care Physician:  [name] Other Clinician: Referring Physician: Threasa Hughes Treating Physician/Extender: [name] in Treatment: 10 Constitutional . Pulse regular. Respirations normal and unlabored. Afebrile. . Eyes Nonicteric. Reactive to light. Ears, Nose, Mouth, and Throat Lips, teeth, and gums WNL.Marland Kitchen Moist mucosa without lesions. Neck supple and nontender. No palpable supraclavicular or cervical adenopathy. Normal sized without goiter. Respiratory WNL. No retractions.. Breath sounds WNL, No rubs, rales, rhonchi, or wheeze.. Cardiovascular Heart rhythm and rate regular, no murmur or gallop.. Pedal Pulses WNL. No clubbing, cyanosis or edema. Chest Breasts symmetical and no nipple discharge.. Breast tissue WNL, no masses, lumps, or tenderness.. Lymphatic No adneopathy. No adenopathy. No adenopathy. Musculoskeletal Adexa without tenderness or enlargement.. Digits and nails w/o clubbing, cyanosis, infection, petechiae, ischemia, or inflammatory conditions.. Integumentary (Hair, Skin) No suspicious lesions. No crepitus or fluctuance. No peri-wound warmth or erythema. No  masses.Marland Kitchen Psychiatric Judgement and insight Intact.. No evidence of depression, anxiety, or agitation.. Notes both the heels have adherent eschar but I could get into a plane and sharply debride the eschar with a subcutaneous debris and it does not probe down to bone. There is minimal necrotic subcutaneous debris. Bleeding controlled with pressure. Electronic Signature(s) Signed: 06/17/2016 11:23:06 AM By: Larry Fudge MD, FACS Entered By: [name] on 06/17/2016 11:23:05 [name] ([id number]) -------------------------------------------------------------------------------- Physician Orders Details Patient Name: [name] Date of Service: 06/17/2016 10:45 AM Medical Record Number: [id number] Patient Account Number: [id number] Date of Birth/Sex: [birth year] (60 y.o. Male) Treating RN: [name] Primary Care Physician: [name] Other Clinician: Referring Physician: Threasa Hughes Treating Physician/Extender: [name] in Treatment: 10 Verbal / Phone Orders: Yes Clinician: Montey Hughes Read Back and Verified: Yes Diagnosis Coding Wound Cleansing Wound #3 Left Calcaneus o Clean wound with Normal Saline. o Cleanse wound with mild soap and water Wound #4 Right Calcaneus o Clean wound with Normal Saline. o Cleanse wound with mild soap and water Anesthetic Wound #3 Left Calcaneus o Topical Lidocaine 4% cream applied to wound bed prior to debridement - office use only Wound #4 Right Calcaneus o Topical Lidocaine 4% cream applied to wound bed prior to debridement - office use only Primary Wound Dressing Wound #3 Left Calcaneus o Santyl Ointment Wound #4 Right Calcaneus o Santyl Ointment Secondary Dressing Wound #3 Left Calcaneus o ABD pad - foam to top of foot for protection o Dry Gauze o Conform/Kerlix - tape, netting Wound #4 Right Calcaneus o ABD pad - foam to top of foot for protection o Dry Gauze o Conform/Kerlix  - tape, netting Dressing Change Frequency Wound #3 Left Calcaneus Matters, Apollos ([id number]) o Change dressing every other day. Wound #4 Right Calcaneus o Change dressing every other day. Follow-up Appointments Wound #3 Left Calcaneus o Return Appointment in 1 week. Wound #4 Right Calcaneus o Return Appointment in 1 week. Off-Loading o Heel suspension boot to: - pt to wear sage boots, please float heels o Turn and reposition every 2 hours o Other: - Advertising account planner Orders / Instructions Wound #3 Left Calcaneus o Increase protein intake. Wound #4 Right Calcaneus o Increase protein intake. Home Health Wound #3 Left Calcaneus o Continue Home Health Visits - [place] *****Please Order [name] for patient***** o Home Health Nurse Larry visit PRN to address patientos wound care needs. o FACE TO FACE ENCOUNTER: MEDICARE and MEDICAID PATIENTS: I certify that this patient is under my care and that I had a face-to-face encounter that meets the physician face-to-face encounter requirements with this patient on this  date. The encounter with the patient was in whole or in part for the following MEDICAL CONDITION: (primary reason for [name]) MEDICAL NECESSITY: I certify, that based on my findings, NURSING services are a medically necessary home health service. HOME BOUND STATUS: I certify that my clinical findings support that this patient is homebound (i.e., Due to illness or injury, pt requires aid of supportive devices such as crutches, cane, wheelchairs, walkers, the use of special transportation or the assistance of another person to leave their place of residence. There is a normal inability to leave the home and doing so requires considerable and taxing effort. Other absences are for medical reasons / religious services and are infrequent or of short duration when for other reasons). o If current dressing causes regression in wound  condition, Larry D/C ordered dressing product/s and apply Normal Saline Moist Dressing daily until next Hughes [place] / Other MD appointment. [place] of regression in wound condition at [phone]. o Please direct any NON-WOUND related issues/requests for orders to patient's Primary Care Physician Wound #4 Right Calcaneus o [place] Visits - [place] *****Please Order [place] for patient***** o Home Health Nurse Larry visit PRN to address patientos wound care needs. [name], [name] ([id number]) o FACE TO FACE ENCOUNTER: MEDICARE and MEDICAID PATIENTS: I certify that this patient is under my care and that I had a face-to-face encounter that meets the physician face-to-face encounter requirements with this patient on this date. The encounter with the patient was in whole or in part for the following MEDICAL CONDITION: (primary reason for [name]) MEDICAL NECESSITY: I certify, that based on my findings, NURSING services are a medically necessary home health service. HOME BOUND STATUS: I certify that my clinical findings support that this patient is homebound (i.e., Due to illness or injury, pt requires aid of supportive devices such as crutches, cane, wheelchairs, walkers, the use of special transportation or the assistance of another person to leave their place of residence. There is a normal inability to leave the home and doing so requires considerable and taxing effort. Other absences are for medical reasons / religious services and are infrequent or of short duration when for other reasons). o If current dressing causes regression in wound condition, Larry D/C ordered dressing product/s and apply Normal Saline Moist Dressing daily until next [place] / Other MD appointment. [place] of regression in wound condition at [phone]. o Please direct any NON-WOUND related issues/requests for orders  to patient's Primary Care Physician Medications-please add to medication list. Wound #3 Left Calcaneus o P.O. Antibiotics - Doxycycline 100mg  po BID x 7days, Cipro sent to patient's pharmacy o Santyl Enzymatic Ointment o Other: - Vitamin C, Zinc, Multivitamin Wound #4 Right Calcaneus o Santyl Enzymatic Ointment o Other: - Vitamin C, Zinc, Multivitamin Patient Medications Allergies: NKDA Notifications Medication Indication Start End Cipro 06/17/2016 DOSE oral 500 mg tablet - tablet oral bid Electronic Signature(s) Signed: 06/17/2016 11:25:52 AM By: Larry Fudge MD, FACS Entered By: [name] on 06/17/2016 11:25:51 [name] ([id number]) -------------------------------------------------------------------------------- Problem List Details Patient Name: [name] Date of Service: 06/17/2016 10:45 AM Medical Record Number: [id number] Patient Account Number: [id number] Date of Birth/Sex: [birth year] (60 y.o. 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 Male) Treating RN: [name] Primary Care Physician: [name] Other Clinician: Referring Physician: Threasa Hughes Treating Physician/Extender: [name] in Treatment: 10 Subjective Chief Complaint Information obtained from Patient The patient is here for wounds on his bilateral feet supposedly obtained during a hospitalization from 3/24 through 4/6 History of Present Illness (HPI) 04/08/16; this is a patient we really don't know too much about. He was hospitalized from 3/24 through 4/6 residing with a sodium of 167 creatinine of 2.49 right lower lobe pneumonia sepsis/septic shock syndrome. He apparently came back to [facility] He apparently had been there only 2 weeks before he went out. I know very little about him premorbidly. The hospital he had a CT scan of the head that showed atrophy and chronic small vessel white matter ischemic changes and remote infarcts also noted within the bilateral basal ganglia. His echocardiogram and presentation showed a EF of 20-25%, left ventricle was moderately dilated with diffuse hypokinesis. Apparently he return to his assisted living with wounds on his bilateral feet. According to the attendant came with him he could walk before he went out he could no longer walk now. There are wounds on both heels covered with a black eschar and also wounds on his bilateral dorsal feet. ABIs calculated in our clinic for 0.49 bilaterally. He was a smoker up to 2 months ago he is not smoking now. 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 Monophasic waves distally bilaterally. He is going back at the end of the  month I believe June 29 probably for an arteriogram as arranged by Dr. Delana Meyer in the meantime he has had no real changes 04/30/16; we are waiting vascular review on June 29. The black eschar over his bilateral heels is beginning to separate using Santyl. The areas on the dorsal aspect of his feet looked considerably better/resolved. He apparently has no complaints. He also has a severe presumably ischemic cardiomyopathy as noted above 05/21/16:pt returns today for ongoing evaluation of wounds. I reviewed recent venous and arterial diagnostic findings. He is scheduled for a LLE angio on 05/26/16 and RLE angio on 06/02/16 for advanced evaluation of PAD as noted per lower extremity arterial duplex. BLE venous duplex was negative for venous reflux, DVT or insufficiency. 06/10/16; this is a patient I have not seen in over a month although he was seen here on 7/12. When I admitted him to our clinic I felt he had pressure ulcerations but also potential for limb threatening ischemia. I referred him urgently to vascular surgery. Since we have last seen him in this clinic he has undergone 2 procedures. On 7/17 he will underwent percutaneous transluminal angioplasty of the left above-knee popliteal artery as well as the mid to distal SFA.  On 7/27 he underwent for cutaneous transluminal angioplasty of the right popliteal artery and SFA an angioplasty of the proximal SFA. He had a stent placement in the right popliteal artery. The patient is difficult to gauge symptomatology although he does not [name], [name] ([name]) currently complain of pain. Our intake nurse reports purulent drainage out of the left heel and an odor. 06/17/2016 -- right heel x-ray and left heel x-ray -- IMPRESSION:No radiographic evidence of osteomyelitis. Soft tissue changes are consistent with cellulitis. He had a wound culture which is positive for abundant Citrobacter fundi and pseudomonas aeruginosa. Both are sensitive to  ciprofloxacin and at the present time the patient is on doxycycline 100 mg, prescribed by Dr. Dellia Nims last week. I will prescribe ciprofloxacin for him today. Objective Constitutional Pulse regular. Respirations normal and unlabored. Afebrile. Vitals Time Taken: 11:00 AM, Height: 70 in, Pulse: 68 bpm, Respiratory Rate: 16 breaths/min, Blood Pressure: 94/68 mmHg. Eyes Nonicteric. Reactive to light. Ears, Nose, Mouth, and Throat Lips, teeth, and gums WNL.Marland Kitchen Moist mucosa without lesions. Neck supple and nontender. No palpable supraclavicular or cervical adenopathy. Normal sized without goiter. Respiratory WNL. No retractions.. Breath sounds WNL, No rubs, rales, rhonchi, or wheeze.. Cardiovascular Heart rhythm and rate regular, no murmur or gallop.. Pedal Pulses WNL. No clubbing, cyanosis or edema. Chest Breasts symmetical and no nipple discharge.. Breast tissue WNL, no masses, lumps, or tenderness.. Lymphatic No adneopathy. No adenopathy. No adenopathy. Musculoskeletal Adexa without tenderness or enlargement.. Digits and nails w/o clubbing, cyanosis, infection, petechiae, ischemia, or inflammatory conditions.Marland Kitchen Psychiatric [name], [name] ([name]) Judgement and insight Intact.. No evidence of depression, anxiety, or agitation.. General Notes: both the heels have adherent eschar but I could get into a plane and sharply debride the eschar with a subcutaneous debris and it does not probe down to bone. There is minimal necrotic subcutaneous debris. Bleeding controlled with pressure. Integumentary (Hair, Skin) No suspicious lesions. No crepitus or fluctuance. No peri-wound warmth or erythema. No masses.. Wound #2 status is Healed - Epithelialized. Original cause of wound was Pressure Injury. The wound is located on the Right,Dorsal Foot. The wound measures 0cm length x 0cm width x 0cm depth; 0cm^2 area and 0cm^3 volume. Wound #3 status is Open. Original cause of wound was Pressure Injury.  The wound is located on the Left Calcaneus. The wound measures 2cm length x 6.6cm width x 0.2cm depth; 10.367cm^2 area and 2.073cm^3 volume. The wound is limited to skin breakdown. There is no tunneling or undermining noted. There is a large amount of purulent drainage noted. The wound margin is flat and intact. There is no granulation within the wound bed. There is a large (67-100%) amount of necrotic tissue within the wound bed including Eschar and Adherent Slough. The periwound skin appearance exhibited: Dry/Scaly. Periwound temperature was noted as No Abnormality. The periwound has tenderness on palpation. Wound #4 status is Open. Original cause of wound was Pressure Injury. The wound is located on the Right Calcaneus. The wound measures 2.3cm length x 5.5cm width x 0.1cm depth; 9.935cm^2 area and 0.994cm^3 volume. The wound is limited to skin breakdown. There is no tunneling or undermining noted. There is a large amount of purulent drainage noted. The wound margin is flat and intact. There is no granulation within the wound bed. There is a large (67-100%) amount of necrotic tissue within the wound bed including Eschar. The periwound skin appearance exhibited: Dry/Scaly. Periwound temperature was noted as No Abnormality. The periwound has tenderness on palpation. 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 Santyl ointment to both heels to be changed daily. 2. offloading of the heels 3. ciprofloxacin 500 mg twice a day for 10 days 4. Adequate protein, vitamin C, vitamin A  and zinc 5. regular visits to the wound center Procedures Wound #3 Wound #3 is a Pressure Ulcer located on the Left Calcaneus . There was a Skin/Subcutaneous Tissue Debridement [id number] debridement with total area of 13.2 sq cm performed by Larry Fudge, MD. with the following instrument(s): Forceps and Scissors to remove Viable and Non-Viable tissue/material including Fibrin/Slough, Eschar, and Subcutaneous after achieving pain control using Lidocaine 4% Topical Solution. A time out was conducted prior to the start of the procedure. A Minimum amount of bleeding was controlled with Pressure. The procedure was tolerated well with a pain level of 0 throughout and a pain level of 0 following the procedure. Post Debridement Measurements: 2cm length x 6.6cm width x 0.2cm depth; 2.073cm^3 volume. Post debridement Stage noted as Category/Stage II. Post procedure Diagnosis Wound #3: Same as Pre-Procedure Wound #4 Wound #4 is a Pressure Ulcer located on the Right Calcaneus . There was a Skin/Subcutaneous Tissue Debridement [id number] debridement with total area of 12.65 sq cm performed by Larry Fudge, MD. with the following instrument(s): Forceps and Scissors to remove Viable and Non-Viable tissue/material including Fibrin/Slough, Eschar, and Subcutaneous after achieving pain control using Lidocaine 4% Topical Solution. A time out was conducted prior to the start of the procedure. A Minimum amount of bleeding was controlled with Pressure. The procedure was tolerated well with a pain level of 0 throughout and a pain level of 0 following the procedure. Post Debridement Measurements: 2.3cm length x 5.5cm width x 0.2cm depth; 1.987cm^3 volume. Post debridement Stage noted as Category/Stage II. Post procedure Diagnosis Wound #4: Same as Pre-Procedure Plan Wound Cleansing: Wound #3 Left Calcaneus: Clean wound with Normal Saline. Cleanse wound with mild soap and water Wound #4 Right  Calcaneus: Clean wound with Normal Saline. Cleanse wound with mild soap and water Anesthetic: Wound #3 Left Calcaneus: Topical Lidocaine 4% cream applied to wound bed prior to debridement - office use only Wound #4 Right Calcaneus: Topical Lidocaine 4% cream applied to wound bed prior to debridement - office use only [name], [name] ([name]) Primary Wound Dressing: Wound #3 Left Calcaneus: Santyl Ointment Wound #4 Right Calcaneus: Santyl Ointment Secondary Dressing: Wound #3 Left Calcaneus: ABD pad - foam to top of foot for protection Dry Gauze Conform/Kerlix - tape, netting Wound #4 Right Calcaneus: ABD pad - foam to top of foot for protection Dry Gauze Conform/Kerlix - tape, netting Dressing Change Frequency: Wound #3 Left Calcaneus: Change dressing every other day. Wound #4 Right Calcaneus: Change dressing every other day. Follow-up Appointments: Wound #3 Left Calcaneus: Return Appointment in 1 week. Wound #4 Right Calcaneus: Return Appointment in 1 week. Off-Loading: Heel suspension boot to: - pt to wear sage boots, please float heels Turn and reposition every 2 hours Other: - Advertising account planner Orders / Instructions: Wound #3 Left Calcaneus: Increase protein intake. Wound #4 Right Calcaneus: Increase protein intake. Home Health: Wound #3 Left Calcaneus: Continue Home Health Visits - [name]  Care *****Please Order [place] for patient***** Home Health Nurse Larry visit PRN to address patient s wound care needs. FACE TO FACE ENCOUNTER: MEDICARE and MEDICAID PATIENTS: I certify that this patient is under my care and that I had a face-to-face encounter that meets the physician face-to-face encounter requirements with this patient on this date. The encounter with the patient was in whole or in part for the following MEDICAL CONDITION: (primary reason for [place]) MEDICAL NECESSITY: I certify, that based on my findings, NURSING services are a  medically necessary home health service. HOME BOUND STATUS: I certify that my clinical findings support that this patient is homebound (i.e., Due to illness or injury, pt requires aid of supportive devices such as crutches, cane, wheelchairs, walkers, the use of special transportation or the assistance of another person to leave their place of residence. There is a normal inability to leave the home and doing so requires considerable and taxing effort. Other absences are for medical reasons / religious services and are infrequent or of short duration when for other reasons). If current dressing causes regression in wound condition, Larry D/C ordered dressing product/s and apply Normal Saline Moist Dressing daily until next [place] / Other MD appointment. [name] of regression in wound condition at [phone]. Please direct any NON-WOUND related issues/requests for orders to patient's Primary Care Physician [name], [name] ([phone]) Wound #4 Right Calcaneus: [place] *****Please Order [place] for patient***** Home Health Nurse Larry visit PRN to address patient s wound care needs. FACE TO FACE ENCOUNTER: MEDICARE and MEDICAID PATIENTS: I certify that this patient is under my care and that I had a face-to-face encounter that meets the physician face-to-face encounter requirements with this patient on this date. The encounter with the patient was in whole or in part for the following MEDICAL CONDITION: (primary reason for [place]) MEDICAL NECESSITY: I certify, that based on my findings, NURSING services are a medically necessary home health service. HOME BOUND STATUS: I certify that my clinical findings support that this patient is homebound (i.e., Due to illness or injury, pt requires aid of supportive devices such as crutches, cane, wheelchairs, walkers, the use of special transportation or the assistance of another  person to leave their place of residence. There is a normal inability to leave the home and doing so requires considerable and taxing effort. Other absences are for medical reasons / religious services and are infrequent or of short duration when for other reasons). If current dressing causes regression in wound condition, Larry D/C ordered dressing product/s and apply Normal Saline Moist Dressing daily until next [place] / Other MD appointment. [facility] of regression in wound condition at [phone]. Please direct any NON-WOUND related issues/requests for orders to patient's Primary Care Physician Medications-please add to medication list.: Wound #3 Left Calcaneus: P.O. Antibiotics - Doxycycline 100mg  po BID x 7days, Cipro sent to patient's pharmacy Santyl Enzymatic Ointment Other: - Vitamin C, Zinc, Multivitamin Wound #4 Right Calcaneus: Santyl Enzymatic Ointment Other: - Vitamin C, Zinc, Multivitamin The following medication(s) was prescribed: Cipro oral 500 mg tablet tablet oral bid starting 06/17/2016 after reviewing his x-rays, culture and sensitivity report and sharply debriding his wound I have recommended: 1. Santyl ointment to both heels to be changed daily. 2. offloading of the heels 3. ciprofloxacin 500 mg twice a day for 10 days 4. Adequate protein, vitamin C, vitamin A and zinc 5. regular  visits to the wound center Electronic Signature(s) Signed: 06/17/2016 11:28:16 AM By: Larry Fudge MD, FACS Entered By: [name] on 06/17/2016 11:28:16 [name] ([name]) -------------------------------------------------------------------------------- SuperBill Details Patient Name: [name] Date of Service: 06/17/2016 Medical Record Number: [name] Patient Account Number: [id number] Date of Birth/Sex: [birth year] (60 y.o. 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 Facility Procedures CPT4: Description Modifier Quantity Code [id number] - DEB SUBQ TISSUE 20 SQ CM/< 1 ICD-10 Description Diagnosis L89.610 Pressure ulcer of right heel, unstageable L89.620 Pressure ulcer of left heel, unstageable I70.234 Atherosclerosis of native  arteries of right leg with ulceration of heel and midfoot I70.245 Atherosclerosis of native arteries of left leg with ulceration of other part of foot CPT4: [id number] - DEB SUBQ TISS EA ADDL 20CM 1 ICD-10 Description Diagnosis L89.610 Pressure ulcer of right heel, unstageable L89.620 Pressure ulcer of left heel, unstageable I70.234 Atherosclerosis of native arteries of right leg with ulceration of  heel and midfoot I70.245 Atherosclerosis of native arteries of left leg with ulceration of other part of foot Physician Procedures CPT4: Description Modifier Quantity Code [id number] - WC PHYS LEVEL 3 - EST PT 25 1 Description [name], [name] ([name]) Electronic Signature(s) Signed: 06/17/2016 11:29:10 AM By: Larry Fudge MD, FACS Entered By: [name] on 06/17/2016 11:29:10

## 2016-06-25 ENCOUNTER — Encounter: Payer: Medicare Other | Admitting: Surgery

## 2016-06-25 DIAGNOSIS — L89610 Pressure ulcer of right heel, unstageable: Secondary | ICD-10-CM | POA: Diagnosis not present

## 2016-06-27 NOTE — Progress Notes (Signed)
DEKE, RENCH ([id number]) Visit Report for 06/25/2016 Chief Complaint Document Details Patient Name: [name], [name] Date of Service: 06/25/2016 8:45 AM Medical Record Number: [id number] Patient Account Number: [id number] Date of Birth/Sex: 07-28-1956 (60 y.o. Male) Treating RN: Cornell Barman Primary Care Physician: [name] Other Clinician: Referring Physician: [name] Treating Physician/Extender: Frann Rider in Treatment: 11 Information Obtained from: Patient Chief Complaint The patient is here for wounds on his bilateral feet supposedly obtained during a hospitalization from 3/24 through 4/6 Electronic Signature(s) Signed: 06/25/2016 9:28:15 AM By: Christin Fudge MD, FACS Entered By: Christin Fudge on 06/25/2016 09:28:14 [name] ([id number]) -------------------------------------------------------------------------------- Debridement Details Patient Name: [name] Date of Service: 06/25/2016 8:45 AM Medical Record Number: [id number] Patient Account Number: [id number] Date of Birth/Sex: 02-04-1956 (60 y.o. Male) Treating RN: Cornell Barman Primary Care Physician: [name] Other Clinician: Referring Physician: [name] Treating Physician/Extender: Frann Rider in Treatment: 11 Debridement Performed for Wound #3 Left Calcaneus Assessment: Performed By: Physician Christin Fudge, MD Debridement: Debridement Pre-procedure Yes Verification/Time Out Taken: Start Time: 09:05 Pain Control: Other : lidoxaine 4% Level: Skin/Subcutaneous Tissue Total Area Debrided (L x 2.2 (cm) x 6.5 (cm) = 14.3 (cm) W): Tissue and other Viable, Non-Viable, Exudate, Fibrin/Slough, Skin, Subcutaneous material debrided: Instrument: Forceps, Scissors Bleeding: Minimum Hemostasis Achieved: Pressure End Time: 09:12 Procedural Pain: 1 Post Procedural Pain: 0 Response to Treatment: Procedure was tolerated well Post Debridement Measurements of Total  Wound Length: (cm) 2.2 Stage: Category/Stage II Width: (cm) 6.5 Depth: (cm) 0.2 Volume: (cm) 2.246 Post Procedure Diagnosis Same as Pre-procedure Electronic Signature(s) Signed: 06/25/2016 9:27:58 AM By: Christin Fudge MD, FACS Signed: 06/26/2016 5:06:33 PM By: Gretta Cool, RN, BSN, Kim RN, BSN Entered By: Christin Fudge on 06/25/2016 09:27:58 [name] ([id number]) -------------------------------------------------------------------------------- Debridement Details Patient Name: [name] Date of Service: 06/25/2016 8:45 AM Medical Record Number: [id number] Patient Account Number: [id number] Date of Birth/Sex: April 21, 1956 (60 y.o. Male) Treating RN: Cornell Barman Primary Care Physician: [name] Other Clinician: Referring Physician: [name] Treating Physician/Extender: Frann Rider in Treatment: 11 Debridement Performed for Wound #4 Right Calcaneus Assessment: Performed By: Physician Christin Fudge, MD Debridement: Debridement Pre-procedure Yes Verification/Time Out Taken: Start Time: 09:05 Pain Control: Other : lidoxaine 4% Level: Skin/Subcutaneous Tissue Total Area Debrided (L x 1.8 (cm) x 3 (cm) = 5.4 (cm) W): Tissue and other Viable, Non-Viable, Exudate, Fibrin/Slough, Skin, Subcutaneous material debrided: Instrument: Forceps, Scissors Bleeding: Minimum Hemostasis Achieved: Pressure End Time: 09:12 Procedural Pain: 1 Post Procedural Pain: 0 Response to Treatment: Procedure was tolerated well Post Debridement Measurements of Total Wound Length: (cm) 1.8 Stage: Category/Stage II Width: (cm) 3 Depth: (cm) 0.1 Volume: (cm) 0.424 Post Procedure Diagnosis Same as Pre-procedure Electronic Signature(s) Signed: 06/25/2016 9:28:07 AM By: Christin Fudge MD, FACS Signed: 06/26/2016 5:06:33 PM By: Gretta Cool, RN, BSN, Kim RN, BSN Entered By: Christin Fudge on 06/25/2016 09:28:07 [name]  ([id number]) -------------------------------------------------------------------------------- HPI Details Patient Name: [name] Date of Service: 06/25/2016 8:45 AM Medical Record Number: [id number] Patient Account Number: [id number] Date of Birth/Sex: [birth year] (60 y.o. Male) Treating RN: Cornell Barman Primary Care Physician: [name] Other Clinician: Referring Physician: [name] Treating Physician/Extender: Frann Rider in Treatment: 11 History of Present Illness HPI Description: 04/08/16; this is a patient we really don't know too much about. He was hospitalized from 3/24 through 4/6 residing with a sodium of 167 creatinine of 2.49 right lower lobe pneumonia sepsis/septic shock syndrome. He apparently came back to [facility] He apparently had been there only 2 weeks before he went out. I know very little about him premorbidly. The hospital he had a CT scan of the head that showed atrophy and chronic small vessel white matter ischemic changes and remote infarcts also noted within the bilateral basal ganglia. His echocardiogram and presentation showed a EF of 20-25%, left ventricle was moderately dilated with diffuse hypokinesis. Apparently he return to his assisted living with wounds on his bilateral feet. According to the attendant came with him he could walk before he went out he could no longer walk now. There are wounds on both heels covered with a black eschar and also wounds on his bilateral dorsal feet. ABIs calculated in our clinic for 0.49 bilaterally. He was a smoker up to 2 months ago he is not smoking now. 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 Monophasic waves distally bilaterally. He is going back at the end of the month I  believe June 29 probably for an arteriogram as arranged by Dr. Delana Meyer in the meantime he has had no real changes 04/30/16; we are waiting vascular review on June 29. The black eschar over his bilateral heels is beginning to separate using Santyl. The areas on the dorsal aspect of his feet looked considerably better/resolved. He apparently has no complaints. He also has a severe presumably ischemic cardiomyopathy as noted above 05/21/16:pt returns today for ongoing evaluation of wounds. I reviewed recent venous and arterial diagnostic findings. He is scheduled for a LLE angio on 05/26/16 and RLE angio on 06/02/16 for advanced evaluation of PAD as noted per lower extremity arterial duplex. BLE venous duplex was negative for venous reflux, DVT or insufficiency. 06/10/16; this is a patient I have not seen in over a month although he was seen here on 7/12. When I admitted him to our clinic I felt he had pressure ulcerations but also potential for limb threatening ischemia. I referred him urgently to vascular surgery. Since we have last seen him in this clinic he has undergone 2 procedures. On 7/17 he will underwent percutaneous transluminal angioplasty of the left above-knee popliteal artery as well as the mid to distal SFA. On 7/27 he underwent for cutaneous transluminal angioplasty of the right popliteal artery and SFA an angioplasty of the proximal SFA. He had a stent placement in the right popliteal artery. The patient is difficult to gauge symptomatology although he does not currently complain of pain. Our intake nurse reports purulent drainage out of the left heel and an odor. 06/17/2016 -- right heel x-ray and left heel x-ray -- IMPRESSION:No radiographic evidence of osteomyelitis. Soft tissue changes are consistent with cellulitis. He had a wound culture which is positive for abundant Citrobacter fundi and pseudomonas aeruginosa. [place]land, Roman ([id number]) Both are sensitive to ciprofloxacin and  at the present time the patient is on doxycycline 100 mg, prescribed by Dr. Dellia Nims last week. I will prescribe ciprofloxacin for him today. Electronic Signature(s) Signed: 06/25/2016 9:28:24 AM By: Christin Fudge MD, FACS Entered By: Christin Fudge on 06/25/2016 [id number] [name] ([id number]) -------------------------------------------------------------------------------- Physical Exam Details Patient Name: [name] Date of Service: 06/25/2016 8:45 AM Medical Record Number: [id number] Patient Account Number: [id number] Date of Birth/Sex: 07/18/1956 (60 y.o. Male) Treating RN: Cornell Barman Primary Care Physician: [name] Other Clinician: Referring Physician: [name] Treating Physician/Extender: Christin Fudge  Weeks in Treatment: 11 Constitutional . Pulse regular. Respirations normal and unlabored. Afebrile. . Eyes Nonicteric. Reactive to light. Ears, Nose, Mouth, and Throat Lips, teeth, and gums WNL.Marland Kitchen Moist mucosa without lesions. Neck supple and nontender. No palpable supraclavicular or cervical adenopathy. Normal sized without goiter. Respiratory WNL. No retractions.. Breath sounds WNL, No rubs, rales, rhonchi, or wheeze.. Cardiovascular Heart rhythm and rate regular, no murmur or gallop.. Pedal Pulses WNL. No clubbing, cyanosis or edema. Lymphatic No adneopathy. No adenopathy. No adenopathy. Musculoskeletal Adexa without tenderness or enlargement.. Digits and nails w/o clubbing, cyanosis, infection, petechiae, ischemia, or inflammatory conditions.. Integumentary (Hair, Skin) No suspicious lesions. No crepitus or fluctuance. No peri-wound warmth or erythema. No masses.Marland Kitchen Psychiatric Judgement and insight Intact.. No evidence of depression, anxiety, or agitation.. Notes after placing him in the lateral position on a flat examining bed I was able to debride significant amount of subcutaneous debris with a forcep and scissors. Continues to have need for Santyl  ointment. Electronic Signature(s) Signed: 06/25/2016 9:29:27 AM By: Christin Fudge MD, FACS Entered By: Christin Fudge on 06/25/2016 09:29:27 [name] ([id number]) -------------------------------------------------------------------------------- Physician Orders Details Patient Name: [name] Date of Service: 06/25/2016 8:45 AM Medical Record Number: [id number] Patient Account Number: [id number] Date of Birth/Sex: 1955-12-15 (60 y.o. Male) Treating RN: Cornell Barman Primary Care Physician: [name] Other Clinician: Referring Physician: [name] Treating Physician/Extender: Frann Rider in Treatment: 11 Verbal / Phone Orders: Yes Clinician: Cornell Barman Read Back and Verified: Yes Diagnosis Coding Wound Cleansing Wound #3 Left Calcaneus o Clean wound with Normal Saline. o Cleanse wound with mild soap and water Anesthetic Wound #3 Left Calcaneus o Topical Lidocaine 4% cream applied to wound bed prior to debridement - office use only Wound #4 Right Calcaneus o Topical Lidocaine 4% cream applied to wound bed prior to debridement - office use only Primary Wound Dressing Wound #3 Left Calcaneus o Santyl Ointment Wound #4 Right Calcaneus o Santyl Ointment Secondary Dressing Wound #3 Left Calcaneus o ABD pad - foam to top of foot for protection o Dry Gauze o Conform/Kerlix - tape, netting Wound #4 Right Calcaneus o ABD pad - foam to top of foot for protection o Dry Gauze o Conform/Kerlix - tape, netting Dressing Change Frequency Wound #3 Left Calcaneus o Change dressing every other day. Wound #4 Right Calcaneus o Change dressing every other day. Jamison, Courtez ([id number]) Follow-up Appointments Wound #3 Left Calcaneus o Return Appointment in 1 week. Wound #4 Right Calcaneus o Return Appointment in 1 week. Off-Loading Wound #3 Left Calcaneus o Heel suspension boot to: - pt to wear sage boots, please float heels o  Turn and reposition every 2 hours o Other: - [place]oyer Lift Wound #4 Right Calcaneus o Heel suspension boot to: - pt to wear sage boots, please float heels o Turn and reposition every 2 hours o Other: - Advertising account planner Orders / Instructions Wound #3 Left Calcaneus o Increase protein intake. Wound #4 Right Calcaneus o Increase protein intake. Home Health Wound #3 Left Calcaneus o Continue Home Health Visits - [place] *****Please Order [place] for patient***** o Home Health Nurse may visit PRN to address patientos wound care needs. o FACE TO FACE ENCOUNTER: MEDICARE and MEDICAID PATIENTS: I certify that this patient is under my care and that I had a face-to-face encounter that meets the physician face-to-face encounter requirements with this patient on this date. The encounter with the patient was in whole or in part for the following MEDICAL CONDITION: (  primary reason for Home Healthcare) MEDICAL NECESSITY: I certify, that based on my findings, NURSING services are a medically necessary home health service. HOME BOUND STATUS: I certify that my clinical findings support that this patient is homebound (i.e., Due to illness or injury, pt requires aid of supportive devices such as crutches, cane, wheelchairs, walkers, the use of special transportation or the assistance of another person to leave their place of residence. There is a normal inability to leave the home and doing so requires considerable and taxing effort. Other absences are for medical reasons / religious services and are infrequent or of short duration when for other reasons). o If current dressing causes regression in wound condition, may D/C ordered dressing product/s and apply Normal Saline Moist Dressing daily until next [place] / Other MD appointment. [place] of regression in wound condition at [phone]. o Please direct any NON-WOUND related  issues/requests for orders to patient's Primary Care Physician HISHAM, JENSON ([id number]) Wound #4 Right Calcaneus o [place] Visits - [place] *****Please Order [place] for patient***** o Home Health Nurse may visit PRN to address patientos wound care needs. o FACE TO FACE ENCOUNTER: MEDICARE and MEDICAID PATIENTS: I certify that this patient is under my care and that I had a face-to-face encounter that meets the physician face-to-face encounter requirements with this patient on this date. The encounter with the patient was in whole or in part for the following MEDICAL CONDITION: (primary reason for [place]) MEDICAL NECESSITY: I certify, that based on my findings, NURSING services are a medically necessary home health service. HOME BOUND STATUS: I certify that my clinical findings support that this patient is homebound (i.e., Due to illness or injury, pt requires aid of supportive devices such as crutches, cane, wheelchairs, walkers, the use of special transportation or the assistance of another person to leave their place of residence. There is a normal inability to leave the home and doing so requires considerable and taxing effort. Other absences are for medical reasons / religious services and are infrequent or of short duration when for other reasons). o If current dressing causes regression in wound condition, may D/C ordered dressing product/s and apply Normal Saline Moist Dressing daily until next [place] / Other MD appointment. [place] of regression in wound condition at [phone]. o Please direct any NON-WOUND related issues/requests for orders to patient's Primary Care Physician Medications-please add to medication list. Wound #3 Left Calcaneus o P.O. Antibiotics - Complete Doxycycline 100mg  po BID x 7days, Cipro sent to patient's pharmacy o Santyl Enzymatic Ointment o Other: - Vitamin C, Zinc,  Multivitamin Wound #4 Right Calcaneus o P.O. Antibiotics - Complete Doxycycline 100mg  po BID x 7days, Cipro sent to patient's pharmacy o Santyl Enzymatic Ointment o Other: - Vitamin C, Zinc, Multivitamin o Santyl Enzymatic Ointment o Other: - Vitamin C, Zinc, Multivitamin Electronic Signature(s) Signed: 06/25/2016 11:54:41 AM By: Christin Fudge MD, FACS Signed: 06/26/2016 5:06:33 PM By: Gretta Cool RN, BSN, Kim RN, BSN Entered By: Gretta Cool, RN, BSN, Kim on 06/25/2016 09:14:15 Demmer, [name] ([id number]) -------------------------------------------------------------------------------- Problem List Details Patient Name: [name] Date of Service: 06/25/2016 8:45 AM Medical Record Number: [id number] Patient Account Number: [id number] Date of Birth/Sex: 07-Jul-1956 (60 y.o. 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 Male) Treating RN: Cornell Barman Primary Care Physician:  [name] Other Clinician: Referring Physician: [name] Treating Physician/Extender: Frann Rider in Treatment: 11 Subjective Chief Complaint Information obtained from Patient The patient is here for wounds on his bilateral feet supposedly obtained during a hospitalization from 3/24 through 4/6 History of Present Illness (HPI) 04/08/16; this is a patient we really don't know too much about. He was hospitalized from 3/24 through 4/6 residing with a sodium of 167 creatinine of 2.49 right lower lobe pneumonia sepsis/septic shock syndrome. He apparently came back to [facility] He apparently had been there only 2 weeks before he went out. I know very little about him premorbidly. The hospital he had a CT scan of the head that showed atrophy and chronic small vessel white matter ischemic changes and remote infarcts also noted within the bilateral basal ganglia. His echocardiogram and presentation showed a EF of 20-25%, left ventricle was moderately dilated with diffuse hypokinesis. Apparently he return to his assisted living with wounds on his bilateral feet. According to the attendant came with him he could walk before he went out he could no longer walk now. There are wounds on both heels covered with a black eschar and also wounds on his bilateral dorsal feet. ABIs calculated in our clinic for 0.49 bilaterally. He was a smoker up to 2 months ago he is not smoking now. 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 Monophasic waves distally bilaterally. He is going back at the end of the month I believe June 29 probably for an arteriogram as arranged by Dr. Delana Meyer in the meantime he has had no real changes 04/30/16; we are waiting vascular review on  June 29. The black eschar over his bilateral heels is beginning to separate using Santyl. The areas on the dorsal aspect of his feet looked considerably better/resolved. He apparently has no complaints. He also has a severe presumably ischemic cardiomyopathy as noted above 05/21/16:pt returns today for ongoing evaluation of wounds. I reviewed recent venous and arterial diagnostic findings. He is scheduled for a LLE angio on 05/26/16 and RLE angio on 06/02/16 for advanced evaluation of PAD as noted per lower extremity arterial duplex. BLE venous duplex was negative for venous reflux, DVT or insufficiency. 06/10/16; this is a patient I have not seen in over a month although he was seen here on 7/12. When I admitted him to our clinic I felt he had pressure ulcerations but also potential for limb threatening ischemia. I referred him urgently to vascular surgery. Since we have last seen him in this clinic he has undergone 2 procedures. On 7/17 he will underwent percutaneous transluminal angioplasty of the left above-knee popliteal artery as well as the  mid to distal SFA. On 7/27 he underwent for cutaneous transluminal angioplasty of the right popliteal artery and SFA an angioplasty of the proximal SFA. He had a stent placement in the right popliteal artery. The patient is difficult to gauge symptomatology although he does not Ollis, Kole ([id number]) currently complain of pain. Our intake nurse reports purulent drainage out of the left heel and an odor. 06/17/2016 -- right heel x-ray and left heel x-ray -- IMPRESSION:No radiographic evidence of osteomyelitis. Soft tissue changes are consistent with cellulitis. He had a wound culture which is positive for abundant Citrobacter fundi and pseudomonas aeruginosa. Both are sensitive to ciprofloxacin and at the present time the patient is on doxycycline 100 mg, prescribed by Dr. Dellia Nims last week. I will prescribe ciprofloxacin for him  today. Objective Constitutional Pulse regular. Respirations normal and unlabored. Afebrile. Vitals Time Taken: 8:52 AM, Height: 70 in, Pulse: 66 bpm, Respiratory Rate: 18 breaths/min, Blood Pressure: 117/74 mmHg. Eyes Nonicteric. Reactive to light. Ears, Nose, Mouth, and Throat Lips, teeth, and gums WNL.Marland Kitchen Moist mucosa without lesions. Neck supple and nontender. No palpable supraclavicular or cervical adenopathy. Normal sized without goiter. Respiratory WNL. No retractions.. Breath sounds WNL, No rubs, rales, rhonchi, or wheeze.. Cardiovascular Heart rhythm and rate regular, no murmur or gallop.. Pedal Pulses WNL. No clubbing, cyanosis or edema. Lymphatic No adneopathy. No adenopathy. No adenopathy. Musculoskeletal Adexa without tenderness or enlargement.. Digits and nails w/o clubbing, cyanosis, infection, petechiae, ischemia, or inflammatory conditions.Marland Kitchen Psychiatric Judgement and insight Intact.. No evidence of depression, anxiety, or agitation.Marland Kitchen Ranum, Lavoris ([id number]) General Notes: after placing him in the lateral position on a flat examining bed I was able to debride significant amount of subcutaneous debris with a forcep and scissors. Continues to have need for Santyl ointment. Integumentary (Hair, Skin) No suspicious lesions. No crepitus or fluctuance. No peri-wound warmth or erythema. No masses.. Wound #3 status is Open. Original cause of wound was Pressure Injury. The wound is located on the Left Calcaneus. The wound measures 2.2cm length x 6.5cm width x 0.2cm depth; 11.231cm^2 area and 2.246cm^3 volume. The wound is limited to skin breakdown. There is no tunneling or undermining noted. There is a large amount of purulent drainage noted. The wound margin is flat and intact. There is no granulation within the wound bed. There is a large (67-100%) amount of necrotic tissue within the wound bed including Eschar and Adherent Slough. The periwound skin appearance exhibited:  Dry/Scaly. Periwound temperature was noted as No Abnormality. The periwound has tenderness on palpation. Wound #4 status is Open. Original cause of wound was Pressure Injury. The wound is located on the Right Calcaneus. The wound measures 1.8cm length x 3cm width x 0.1cm depth; 4.241cm^2 area and 0.424cm^3 volume. The wound is limited to skin breakdown. There is no tunneling or undermining noted. There is a large amount of purulent drainage noted. The wound margin is flat and intact. There is no granulation within the wound bed. There is a large (67-100%) amount of necrotic tissue within the wound bed including Eschar. The periwound skin appearance exhibited: Dry/Scaly. Periwound temperature was noted as No Abnormality. The periwound has tenderness on palpation. 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 There was a Skin/Subcutaneous Tissue Debridement [id number]) debridement with total area of 14.3 sq cm performed by Christin Fudge, MD. with the following instrument(s): Forceps and Scissors to remove Viable and Non-Viable tissue/material including Exudate, Fibrin/Slough, Skin, and Subcutaneous after achieving pain control using Other (lidoxaine 4%). A time out was conducted prior to the start of the procedure. A Minimum amount of bleeding was controlled with Pressure. The procedure was tolerated well with a pain level of 1 throughout and a pain Horrigan, Kendric ([id number]) level of 0 following the procedure. Post Debridement Measurements: 2.2cm length x 6.5cm width x 0.2cm depth; 2.246cm^3 volume. Post debridement Stage noted as  Category/Stage II. Post procedure Diagnosis Wound #3: Same as Pre-Procedure Wound #4 Wound #4 is a Pressure Ulcer located on the Right Calcaneus . There was a Skin/Subcutaneous Tissue Debridement [id number]) debridement with total area of 5.4 sq cm performed by Christin Fudge, MD. with the following instrument(s): Forceps and Scissors to remove Viable and Non-Viable tissue/material including Exudate, Fibrin/Slough, Skin, and Subcutaneous after achieving pain control using Other (lidoxaine 4%). A time out was conducted prior to the start of the procedure. A Minimum amount of bleeding was controlled with Pressure. The procedure was tolerated well with a pain level of 1 throughout and a pain level of 0 following the procedure. Post Debridement Measurements: 1.8cm length x 3cm width x 0.1cm depth; 0.424cm^3 volume. Post debridement Stage noted as Category/Stage II. Post procedure Diagnosis Wound #4: Same as Pre-Procedure Plan Wound Cleansing: Wound #3 Left Calcaneus: Clean wound with Normal Saline. Cleanse wound with mild soap and water Anesthetic: Wound #3 Left Calcaneus: Topical Lidocaine 4% cream applied to wound bed prior to debridement - office use only Wound #4 Right Calcaneus: Topical Lidocaine 4% cream applied to wound bed prior to debridement - office use only Primary Wound Dressing: Wound #3 Left Calcaneus: Santyl Ointment Wound #4 Right Calcaneus: Santyl Ointment Secondary Dressing: Wound #3 Left Calcaneus: ABD pad - foam to top of foot for protection Dry Gauze Conform/Kerlix - tape, netting Wound #4 Right Calcaneus: ABD pad - foam to top of foot for protection Dry Gauze Conform/Kerlix - tape, netting Dressing Change Frequency: Wound #3 Left Calcaneus: Change dressing every other day. Wound #4 Right Calcaneus: Spalla, Logon ([id number]) Change dressing every other day. Follow-up Appointments: Wound #3 Left Calcaneus: Return Appointment in 1 week. Wound #4 Right  Calcaneus: Return Appointment in 1 week. Off-Loading: Wound #3 Left Calcaneus: Heel suspension boot to: - pt to wear sage boots, please float heels Turn and reposition every 2 hours Other: - Hoyer Lift Wound #4 Right Calcaneus: Heel suspension boot to: - pt to wear sage boots, please float heels Turn and reposition every 2 hours Other: - Advertising account planner Orders / Instructions: Wound #3 Left Calcaneus: Increase protein intake. Wound #4 Right Calcaneus: Increase protein intake. Home Health: Wound #3 Left Calcaneus: Continue Home Health Visits - [place] *****Please Order [place] for patient***** Home Health Nurse may visit PRN to address patient s wound care needs. FACE TO FACE ENCOUNTER: MEDICARE and MEDICAID PATIENTS: I certify that this patient is under my care and that I had a face-to-face encounter that meets the physician face-to-face encounter requirements with this patient on this date. The encounter with the patient was in whole or in part for the following MEDICAL CONDITION: (primary reason for [place]) MEDICAL NECESSITY: I certify, that based on my findings, NURSING services are a medically necessary home health service.  HOME BOUND STATUS: I certify that my clinical findings support that this patient is homebound (i.e., Due to illness or injury, pt requires aid of supportive devices such as crutches, cane, wheelchairs, walkers, the use of special transportation or the assistance of another person to leave their place of residence. There is a normal inability to leave the home and doing so requires considerable and taxing effort. Other absences are for medical reasons / religious services and are infrequent or of short duration when for other reasons). If current dressing causes regression in wound condition, may D/C ordered dressing product/s and apply Normal Saline Moist Dressing daily until next [place] / Other MD appointment. [place] of regression in wound condition at [phone]. Please direct any NON-WOUND related issues/requests for orders to patient's Primary Care Physician Wound #4 Right Calcaneus: [place] Visits - [place] *****Please Order [place] for patient***** Home Health Nurse may visit PRN to address patient s wound care needs. FACE TO FACE ENCOUNTER: MEDICARE and MEDICAID PATIENTS: I certify that this patient is under my care and that I had a face-to-face encounter that meets the physician face-to-face encounter requirements with this patient on this date. The encounter with the patient was in whole or in part for the following MEDICAL CONDITION: (primary reason for [place]) MEDICAL NECESSITY: I certify, that based on my findings, NURSING services are a medically necessary home health service. HOME BOUND STATUS: I certify that my clinical findings support that this patient is homebound (i.e., Due to illness or injury, pt requires aid of supportive devices such as crutches, cane, wheelchairs, walkers, the use of special transportation or the assistance of another person to leave their place of residence. There is a normal inability to leave the home and doing so requires considerable and taxing effort. Other absences are for medical reasons / religious services and are infrequent or of short duration when for other reasons). Tobey, Stevin ([id number]) If current dressing causes regression in wound condition, may D/C ordered dressing product/s and apply Normal Saline Moist Dressing daily until next [place] / Other MD appointment. [place] of regression in wound condition at [phone]. Please direct any NON-WOUND related issues/requests for orders to patient's Primary Care Physician Medications-please add to medication list.: Wound #3 Left Calcaneus: P.O. Antibiotics - Complete Doxycycline 100mg  po BID x 7days, Cipro sent  to patient's pharmacy Santyl Enzymatic Ointment Other: - Vitamin C, Zinc, Multivitamin Wound #4 Right Calcaneus: P.O. Antibiotics - Complete Doxycycline 100mg  po BID x 7days, Cipro sent to patient's pharmacy Santyl Enzymatic Ointment Other: - Vitamin C, Zinc, Multivitamin Santyl Enzymatic Ointment Other: - Vitamin C, Zinc, Multivitamin I have recommended: 1. Santyl ointment to both heels to be changed daily. 2. offloading of the heels 3. ciprofloxacin 500 mg twice a day for 10 days 4. Adequate protein, vitamin C, vitamin A and zinc 5. regular visits to the wound center caregivers at the bedside will make sure that he is still on his antibiotics. Electronic Signature(s) Signed: 06/25/2016 9:30:10 AM By: Christin Fudge MD, FACS Entered By: Christin Fudge on 06/25/2016 09:30:10 [name] ([id number]) -------------------------------------------------------------------------------- SuperBill Details Patient Name: [name] Date of Service: 06/25/2016 Medical Record Number: [id number] Patient Account Number: [id number] Date of Birth/Sex: Nov 04, 1956 (60 y.o. 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 Facility Procedures CPT4: Description Modifier Quantity Code [id number] - DEB SUBQ TISSUE 20 SQ CM/< 1 ICD-10 Description Diagnosis L89.610 Pressure ulcer of right heel, unstageable L89.620 Pressure ulcer of left heel, unstageable I70.234 Atherosclerosis of native  arteries of right leg with ulceration of heel  and midfoot I70.245 Atherosclerosis of native arteries of left leg with ulceration of other part of foot Physician Procedures CPT4: Description Modifier Quantity Code [id number] - WC PHYS SUBQ TISS 20 SQ CM 1 ICD-10 Description Diagnosis L89.610 Pressure ulcer of right heel, unstageable L89.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

## 2016-06-27 NOTE — Progress Notes (Signed)
[name], PHI ([id number]) Visit Report for 06/25/2016 Arrival Information Details Patient Name: [name], [name] Date of Service: 06/25/2016 8:45 AM Medical Record Number: [id number] Patient Account Number: [id number] Date of Birth/Sex: July 04, 1956 (60 y.o. Male) Treating RN: Cornell Barman Primary Care Physician: [name] Other Clinician: Referring Physician: [name] Treating Physician/Extender: Frann Rider in Treatment: 11 Visit Information History Since Last Visit Added or deleted any medications: No Patient Arrived: Wheel Chair Any new allergies or adverse reactions: No Arrival Time: 08:51 Had a fall or experienced change in No activities of daily living that may affect Accompanied By: caregivers risk of falls: Transfer Assistance: Other Signs or symptoms of abuse/neglect since last No Patient Identification Verified: Yes visito Secondary Verification Process Yes Hospitalized since last visit: No Completed: Has Dressing in Place as Prescribed: Yes Patient Requires Transmission-Based No Pain Present Now: No Precautions: Patient Has Alerts: No Electronic Signature(s) Signed: 06/26/2016 5:06:33 PM By: Gretta Cool, RN, BSN, Kim RN, BSN Entered By: Gretta Cool, RN, BSN, Kim on 06/25/2016 08:52:10 [name] ([id number]) -------------------------------------------------------------------------------- Encounter Discharge Information Details Patient Name: [name] Date of Service: 06/25/2016 8:45 AM Medical Record Number: [id number] Patient Account Number: [id number] Date of Birth/Sex: 01/23/1956 (61 y.o. Male) Treating RN: Cornell Barman Primary Care Physician: [name] Other Clinician: Referring Physician: [name] Treating Physician/Extender: Frann Rider in Treatment: 11 Encounter Discharge Information Items Discharge Pain Level: 0 Discharge Condition: Stable Ambulatory Status: Wheelchair Discharge Destination: Nursing Home Transportation:  Private Auto Accompanied By: caregivers Schedule Follow-up Appointment: Yes Medication Reconciliation completed and provided to Patient/Care Yes Malahki Gasaway: Provided on Clinical Summary of Care: 06/25/2016 Form Type Recipient Paper Patient DG Electronic Signature(s) Signed: 06/26/2016 5:06:33 PM By: Gretta Cool, RN, BSN, Kim RN, BSN Previous Signature: 06/25/2016 9:24:01 AM Version By: [name] Entered By: Gretta Cool RN, BSN, Kim on 06/25/2016 09:28:57 [name] ([id number]) -------------------------------------------------------------------------------- Lower Extremity Assessment Details Patient Name: [name] Date of Service: 06/25/2016 8:45 AM Medical Record Number: [id number] Patient Account Number: [id number] Date of Birth/Sex: 08-22-1956 (60 y.o. Male) Treating RN: Cornell Barman Primary Care Physician: [name] Other Clinician: Referring Physician: [name] Treating Physician/Extender: Frann Rider in Treatment: 11 Edema Assessment Assessed: [Left: Yes] [Right: Yes] Edema: [Left: No] [Right: No] Vascular Assessment Pulses: Posterior Tibial Extremity colors, hair growth, and conditions: Extremity Color: [Left:Normal] [Right:Normal] Hair Growth on Extremity: [Left:Yes] [Right:Yes] Temperature of Extremity: [Left:Warm] [Right:Warm] Capillary Refill: [Left:< 3 seconds] [Right:< 3 seconds] Lipodermatosclerosis: [Left:No] [Right:No] Toe Nail Assessment Left: Right: Thick: No No Discolored: No No Deformed: No No Improper Length and Hygiene: Yes Yes Electronic Signature(s) Signed: 06/26/2016 5:06:33 PM By: Gretta Cool, RN, BSN, Kim RN, BSN Entered By: Gretta Cool, RN, BSN, Kim on 06/25/2016 08:56:36 Crothers, [name] ([id number]) -------------------------------------------------------------------------------- Multi Wound Chart Details Patient Name: [name] Date of Service: 06/25/2016 8:45 AM Medical Record Number: [id number] Patient Account Number: [id number] Date  of Birth/Sex: 12-09-1955 (60 y.o. Male) Treating RN: Cornell Barman Primary Care Physician: [name] Other Clinician: Referring Physician: [name] Treating Physician/Extender: Frann Rider in Treatment: 11 Vital Signs Height(in): 70 Pulse(bpm): 66 Weight(lbs): Blood Pressure 117/74 (mmHg): Body Mass Index(BMI): Temperature(F): Respiratory Rate 18 (breaths/min): Photos: [N/A:N/A] Wound Location: Left Calcaneus Right Calcaneus N/A Wounding Event: Pressure Injury Pressure Injury N/A Primary Etiology: Pressure Ulcer Pressure Ulcer N/A Comorbid History: Anemia Anemia N/A Date Acquired: 02/07/2016 02/07/2016 N/A Weeks of Treatment: 11 11 N/A Wound Status: Open Open N/A Measurements L x W x D 2.2x6.5x0.2 1.8x3x0.1 N/A (cm) Area (cm) : 11.231 4.241 N/A Volume (cm) : 2.246 0.424 N/A %  Reduction in Area: 47.40% 66.30% N/A % Reduction in Volume: -5.10% 66.30% N/A Classification: Category/Stage II Category/Stage II N/A Exudate Amount: Large Large N/A Exudate Type: Purulent Purulent N/A Exudate Color: yellow, brown, [place] yellow, brown, [place] N/A Foul Odor After Yes Yes N/A Cleansing: Odor Anticipated Due to No No N/A Product Use: Wound Margin: Flat and Intact Flat and Intact N/A Granulation Amount: None Present (0%) None Present (0%) N/A Balik, Amjad ([id number]) Necrotic Amount: Large (67-100%) Large (67-100%) N/A Necrotic Tissue: Eschar, Adherent Slough Eschar N/A Exposed Structures: Fascia: No Fascia: No N/A Fat: No Fat: No Tendon: No Tendon: No Muscle: No Muscle: No Joint: No Joint: No Bone: No Bone: No Limited to Skin Limited to Skin Breakdown Breakdown Epithelialization: None None N/A Periwound Skin Texture: No Abnormalities Noted No Abnormalities Noted N/A Periwound Skin Dry/Scaly: Yes Dry/Scaly: Yes N/A Moisture: Periwound Skin Color: No Abnormalities Noted No Abnormalities Noted N/A Temperature: No Abnormality No Abnormality  N/A Tenderness on Yes Yes N/A Palpation: Wound Preparation: Ulcer Cleansing: Ulcer Cleansing: N/A Rinsed/Irrigated with Rinsed/Irrigated with Saline Saline Topical Anesthetic Topical Anesthetic Applied: Other: lidocaine Applied: Other: lidocaine 4% 4% Treatment Notes Electronic Signature(s) Signed: 06/26/2016 5:06:33 PM By: Gretta Cool, RN, BSN, Kim RN, BSN Entered By: Gretta Cool, RN, BSN, Kim on 06/25/2016 09:08:03 [name] ([id number]) -------------------------------------------------------------------------------- Multi-Disciplinary Care Plan Details Patient Name: [name] Date of Service: 06/25/2016 8:45 AM Medical Record Number: [id number] Patient Account Number: [id number] Date of Birth/Sex: 03-Dec-1955 (60 y.o. Male) Treating RN: Cornell Barman Primary Care Physician: [name] Other Clinician: Referring Physician: [name] Treating Physician/Extender: Frann Rider in Treatment: 11 Active Inactive Abuse / Safety / Falls / Self Care Management Nursing Diagnoses: Potential for falls Goals: Patient will remain injury free Date Initiated: 04/08/2016 Goal Status: Active Interventions: Assess fall risk on admission and as needed Notes: Nutrition Nursing Diagnoses: Imbalanced nutrition Goals: Patient/caregiver agrees to and verbalizes understanding of need to use nutritional supplements and/or vitamins as prescribed Date Initiated: 04/08/2016 Goal Status: Active Interventions: Assess patient nutrition upon admission and as needed per policy Notes: Orientation to the Wound Care Program Nursing Diagnoses: Knowledge deficit related to the wound healing center program Goals: Patient/caregiver will verbalize understanding of the [place] ([id number]) Date Initiated: 04/08/2016 Goal Status: Active Interventions: Provide education on orientation to the wound center Notes: Pain, Acute or Chronic Nursing Diagnoses: Pain,  acute or chronic: actual or potential Potential alteration in comfort, pain Goals: Patient will verbalize adequate pain control and receive pain control interventions during procedures as needed Date Initiated: 04/08/2016 Goal Status: Active Interventions: Assess comfort goal upon admission Complete pain assessment as per visit requirements Notes: Pressure Nursing Diagnoses: Knowledge deficit related to causes and risk factors for pressure ulcer development Knowledge deficit related to management of pressures ulcers Goals: Patient will remain free from development of additional pressure ulcers Date Initiated: 04/08/2016 Goal Status: Active Interventions: Assess offloading mechanisms upon admission and as needed Assess potential for pressure ulcer upon admission and as needed Notes: Wound/Skin Impairment Nursing Diagnoses: Impaired tissue integrity Dezeeuw, Jayse ([id number]) Goals: Ulcer/skin breakdown will have a volume reduction of 30% by week 4 Date Initiated: 04/08/2016 Goal Status: Active Ulcer/skin breakdown will have a volume reduction of 50% by week 8 Date Initiated: 04/08/2016 Goal Status: Active Ulcer/skin breakdown will have a volume reduction of 80% by week 12 Date Initiated: 04/08/2016 Goal Status: Active Interventions: Assess ulceration(s) every visit Notes: Electronic Signature(s) Signed: 06/26/2016 5:06:33 PM By: Gretta Cool, RN, BSN, Kim RN, BSN Entered By:  Gretta Cool, RN, BSN, Kim on 06/25/2016 09:07:38 LUDDY, [name] ([id number]) -------------------------------------------------------------------------------- Pain Assessment Details Patient Name: JULIAN, MALE Date of Service: 06/25/2016 8:45 AM Medical Record Number: [id number] Patient Account Number: [id number] Date of Birth/Sex: 1956/07/31 (60 y.o. Male) Treating RN: Cornell Barman Primary Care Physician: [name] Other Clinician: Referring Physician: [name] Treating Physician/Extender: Frann Rider in Treatment: 11 Active Problems Location of Pain Severity and Description of Pain Patient Has Paino No Site Locations With Dressing Change: No Pain Management and Medication Current Pain Management: Electronic Signature(s) Signed: 06/26/2016 5:06:33 PM By: Gretta Cool, RN, BSN, Kim RN, BSN Entered By: Gretta Cool, RN, BSN, Kim on 06/25/2016 08:52:17 [name] ([id number]) -------------------------------------------------------------------------------- Patient/Caregiver Education Details Patient Name: [name] Date of Service: 06/25/2016 8:45 AM Medical Record Number: [id number] Patient Account Number: [id number] Date of Birth/Gender: October 05, 1956 (60 y.o. Male) Treating RN: Cornell Barman Primary Care Physician: [name] Other Clinician: Referring Physician: [name] Treating Physician/Extender: Frann Rider in Treatment: 11 Education Assessment Education Provided To: Caregiver Education Topics Provided Wound/Skin Impairment: Handouts: Caring for Your Ulcer, Other: orders sent to [facility] Methods: Demonstration, Explain/Verbal Responses: State content correctly Electronic Signature(s) Signed: 06/26/2016 5:06:33 PM By: Gretta Cool, RN, BSN, Kim RN, BSN Entered By: Gretta Cool, RN, BSN, Kim on 06/25/2016 09:29:18 [name] ([id number]) -------------------------------------------------------------------------------- Wound Assessment Details Patient Name: [name] Date of Service: 06/25/2016 8:45 AM Medical Record Number: [id number] Patient Account Number: [id number] Date of Birth/Sex: Nov 27, 1955 (60 y.o. Male) Treating RN: Cornell Barman Primary Care Physician: [name] Other Clinician: Referring Physician: [name] Treating Physician/Extender: Frann Rider in Treatment: 11 Wound Status Wound Number: 3 Primary Etiology: Pressure Ulcer Wound Location: Left Calcaneus Wound Status: Open Wounding Event: Pressure Injury Comorbid History:  Anemia Date Acquired: 02/07/2016 Weeks Of Treatment: 11 Clustered Wound: No Photos Wound Measurements Length: (cm) 2.2 Width: (cm) 6.5 Depth: (cm) 0.2 Area: (cm) 11.231 Volume: (cm) 2.246 % Reduction in Area: 47.4% % Reduction in Volume: -5.1% Epithelialization: None Tunneling: No Undermining: No Wound Description Classification: Category/Stage II Foul Odor Afte Wound Margin: Flat and Intact Due to Product Exudate Amount: Large Exudate Type: Purulent Exudate Color: yellow, brown, [place] r Cleansing: Yes Use: No Wound Bed Granulation Amount: None Present (0%) Exposed Structure Necrotic Amount: Large (67-100%) Fascia Exposed: No Necrotic Quality: Eschar, Adherent Slough Fat Layer Exposed: No Tendon Exposed: No Muscle Exposed: No Grzywacz, Johnn ([id number]) Joint Exposed: No Bone Exposed: No Limited to Skin Breakdown Periwound Skin Texture Texture Color No Abnormalities Noted: No No Abnormalities Noted: No Moisture Temperature / Pain No Abnormalities Noted: No Temperature: No Abnormality Dry / Scaly: Yes Tenderness on Palpation: Yes Wound Preparation Ulcer Cleansing: Rinsed/Irrigated with Saline Topical Anesthetic Applied: Other: lidocaine 4%, Treatment Notes Wound #3 (Left Calcaneus) 1. Cleansed with: Clean wound with Normal Saline 2. Anesthetic Topical Lidocaine 4% cream to wound bed prior to debridement 4. Dressing Applied: Santyl Ointment 5. Secondary Dressing Applied Kerlix/Conform Notes heel cup Electronic Signature(s) Signed: 06/26/2016 5:06:33 PM By: Gretta Cool, RN, BSN, Kim RN, BSN Entered By: Gretta Cool, RN, BSN, Kim on 06/25/2016 09:02:01 [name] ([id number]) -------------------------------------------------------------------------------- Wound Assessment Details Patient Name: [name] Date of Service: 06/25/2016 8:45 AM Medical Record Number: [id number] Patient Account Number: [id number] Date of Birth/Sex: 19-Sep-1956 (60 y.o. Male) Treating  RN: Cornell Barman Primary Care Physician: [name] Other Clinician: Referring Physician: [name] Treating Physician/Extender: Frann Rider in Treatment: 11 Wound Status Wound Number: 4 Primary Etiology: Pressure Ulcer Wound Location: Right Calcaneus Wound Status: Open Wounding Event: Pressure Injury Comorbid History:  Anemia Date Acquired: 02/07/2016 Weeks Of Treatment: 11 Clustered Wound: No Photos Wound Measurements Length: (cm) 1.8 Width: (cm) 3 Depth: (cm) 0.1 Area: (cm) 4.241 Volume: (cm) 0.424 % Reduction in Area: 66.3% % Reduction in Volume: 66.3% Epithelialization: None Tunneling: No Undermining: No Wound Description Classification: Category/Stage II Foul Odor Afte Wound Margin: Flat and Intact Due to Product Exudate Amount: Large Exudate Type: Purulent Exudate Color: yellow, brown, [place] r Cleansing: Yes Use: No Wound Bed Granulation Amount: None Present (0%) Exposed Structure Necrotic Amount: Large (67-100%) Fascia Exposed: No Necrotic Quality: Eschar Fat Layer Exposed: No Tendon Exposed: No Muscle Exposed: No Lizana, Jiovany ([id number]) Joint Exposed: No Bone Exposed: No Limited to Skin Breakdown Periwound Skin Texture Texture Color No Abnormalities Noted: No No Abnormalities Noted: No Moisture Temperature / Pain No Abnormalities Noted: No Temperature: No Abnormality Dry / Scaly: Yes Tenderness on Palpation: Yes Wound Preparation Ulcer Cleansing: Rinsed/Irrigated with Saline Topical Anesthetic Applied: Other: lidocaine 4%, Treatment Notes Wound #4 (Right Calcaneus) 1. Cleansed with: Clean wound with Normal Saline 2. Anesthetic Topical Lidocaine 4% cream to wound bed prior to debridement 4. Dressing Applied: Santyl Ointment 5. Secondary Dressing Applied Kerlix/Conform Notes heel cup Electronic Signature(s) Signed: 06/26/2016 5:06:33 PM By: Gretta Cool, RN, BSN, Kim RN, BSN Entered By: Gretta Cool, RN, BSN, Kim on 06/25/2016  09:02:28 [name] ([id number]) -------------------------------------------------------------------------------- Vitals Details Patient Name: [name] Date of Service: 06/25/2016 8:45 AM Medical Record Number: [id number] Patient Account Number: [id number] Date of Birth/Sex: [birth year] (60 y.o. Male) Treating RN: Cornell Barman Primary Care Physician: [name] Other Clinician: Referring Physician: [name] Treating Physician/Extender: Frann Rider in Treatment: 11 Vital Signs Time Taken: 08:52 Pulse (bpm): 66 Height (in): 70 Respiratory Rate (breaths/min): 18 Blood Pressure (mmHg): 117/74 Reference Range: 80 - 120 mg / dl Electronic Signature(s) Signed: 06/26/2016 5:06:33 PM By: Gretta Cool, RN, BSN, Kim RN, BSN Entered By: Gretta Cool, RN, BSN, Kim on 06/25/2016 08:52:32

## 2016-07-01 ENCOUNTER — Ambulatory Visit: Payer: Medicare Other | Admitting: Internal Medicine

## 2016-07-16 ENCOUNTER — Encounter: Payer: Medicare Other | Attending: Internal Medicine | Admitting: Internal Medicine

## 2016-07-16 DIAGNOSIS — Z8673 Personal history of transient ischemic attack (TIA), and cerebral infarction without residual deficits: Secondary | ICD-10-CM | POA: Diagnosis not present

## 2016-07-16 DIAGNOSIS — L89610 Pressure ulcer of right heel, unstageable: Secondary | ICD-10-CM | POA: Diagnosis present

## 2016-07-16 DIAGNOSIS — I70234 Atherosclerosis of native arteries of right leg with ulceration of heel and midfoot: Secondary | ICD-10-CM | POA: Insufficient documentation

## 2016-07-16 DIAGNOSIS — Z87891 Personal history of nicotine dependence: Secondary | ICD-10-CM | POA: Insufficient documentation

## 2016-07-16 DIAGNOSIS — I255 Ischemic cardiomyopathy: Secondary | ICD-10-CM | POA: Diagnosis not present

## 2016-07-16 DIAGNOSIS — L89620 Pressure ulcer of left heel, unstageable: Secondary | ICD-10-CM | POA: Insufficient documentation

## 2016-07-16 DIAGNOSIS — I70245 Atherosclerosis of native arteries of left leg with ulceration of other part of foot: Secondary | ICD-10-CM | POA: Diagnosis not present

## 2016-07-17 NOTE — Progress Notes (Signed)
[name], [name] ([id number]) Visit Report for 07/16/2016 Arrival Information Details Patient Name: [name], [name] Date of Service: 07/16/2016 3:45 PM Medical Record Number: [id number] Patient Account Number: [id number] Date of Birth/Sex: [birth year] (60 y.o. Male) Treating RN: [name] Primary Care Physician: [name] Other Clinician: Referring Physician: Threasa Hughes Treating Physician/Extender: [name] in Treatment: 14 Visit Information History Since Last Visit Added or deleted any medications: No Patient Arrived: Wheel Chair Any new allergies or adverse reactions: No Arrival Time: 16:02 Had a fall or experienced change in No activities of daily living that may affect Accompanied By: staff risk of falls: Transfer Assistance: Manual Signs or symptoms of abuse/neglect since last No Patient Identification Verified: Yes visito Secondary Verification Process Yes Hospitalized since last visit: No Completed: Pain Present Now: No Patient Requires Transmission-Based No Precautions: Patient Has Alerts: No Electronic Signature(s) Signed: 07/16/2016 5:38:41 PM By: [name] Entered By: [name] on 07/16/2016 16:02:33 [name] ([id number]) -------------------------------------------------------------------------------- Encounter Discharge Information Details Patient Name: [name] Date of Service: 07/16/2016 3:45 PM Medical Record Number: [id number] Patient Account Number: [id number] Date of Birth/Sex: 1956/02/15 (60 y.o. Male) Treating RN: [name] Primary Care Physician: [name] Other Clinician: Referring Physician: Threasa Hughes Treating Physician/Extender: [name] in Treatment: 14 Encounter Discharge Information Items Discharge Pain Level: 0 Discharge Condition: Stable Ambulatory Status: Wheelchair Discharge Destination: Home Transportation: Private Auto Accompanied By: staff Schedule Follow-up  Appointment: Yes Medication Reconciliation completed No and provided to Patient/Care [name]: Provided on Clinical Summary of Care: 07/16/2016 Form Type Recipient Paper Patient DG Electronic Signature(s) Signed: 07/16/2016 5:38:41 PM By: [name] Previous Signature: 07/16/2016 4:46:28 PM Version By: [name] Entered By: [name] on 07/16/2016 16:47:33 [name] ([id number]) -------------------------------------------------------------------------------- Lower Extremity Assessment Details Patient Name: [name] Date of Service: 07/16/2016 3:45 PM Medical Record Number: [id number] Patient Account Number: [id number] Date of Birth/Sex: [birth year] (60 y.o. Male) Treating RN: [name] Primary Care Physician: [name] Other Clinician: Referring Physician: Threasa Hughes Treating Physician/Extender: [name] in Treatment: 14 Vascular Assessment Pulses: Posterior Tibial Dorsalis Pedis Palpable: [Left:Yes] [Right:Yes] Extremity colors, hair growth, and conditions: Extremity Color: [Left:Normal] [Right:Normal] Hair Growth on Extremity: [Left:No] [Right:No] Temperature of Extremity: [Left:Warm] [Right:Warm] Capillary Refill: [Left:< 3 seconds] [Right:< 3 seconds] Electronic Signature(s) Signed: 07/16/2016 5:38:41 PM By: [name] Entered By: [name] on 07/16/2016 16:11:22 [name] ([id number]) -------------------------------------------------------------------------------- Multi Wound Chart Details Patient Name: [name] Date of Service: 07/16/2016 3:45 PM Medical Record Number: [id number] Patient Account Number: [id number] Date of Birth/Sex: 1956/06/03 (60 y.o. Male) Treating RN: [name] Primary Care Physician: [name] Other Clinician: Referring Physician: Threasa Hughes Treating Physician/Extender: [name] in Treatment: 14 Vital Signs Height(in): 70 Pulse(bpm): 56 Weight(lbs):  Blood Pressure 132/89 (mmHg): Body Mass Index(BMI): Temperature(F): Respiratory Rate 16 (breaths/min): Photos: [N/A:N/A] Wound Location: Left Calcaneus Right Calcaneus N/A Wounding Event: Pressure Injury Pressure Injury N/A Primary Etiology: Pressure Ulcer Pressure Ulcer N/A Comorbid History: Anemia Anemia N/A Date Acquired: 02/07/2016 02/07/2016 N/A Hughes of Treatment: 14 14 N/A Wound Status: Open Open N/A Measurements L x W x D 1.7x6x0.1 1x2.9x0.1 N/A (cm) Area (cm) : 8.011 2.278 N/A Volume (cm) : 0.801 0.228 N/A % Reduction in Area: 62.50% 81.90% N/A % Reduction in Volume: 62.50% 81.90% N/A Classification: Category/Stage II Category/Stage II N/A Exudate Amount: Large Large N/A Exudate Type: Purulent Purulent N/A Exudate Color: yellow, brown, [name] yellow, brown, Schou N/A Foul Odor After Yes Yes N/A Cleansing: Odor Anticipated Due to No No  N/A Product Use: Wound Margin: Flat and Intact Flat and Intact N/A Granulation Amount: Large (67-100%) Large (67-100%) N/A [name] ([id number]) Granulation Quality: Red Red N/A Necrotic Amount: Small (1-33%) Small (1-33%) N/A Necrotic Tissue: Eschar, Adherent Slough Eschar N/A Exposed Structures: Fascia: No Fascia: No N/A Fat: No Fat: No Tendon: No Tendon: No Muscle: No Muscle: No Joint: No Joint: No Bone: No Bone: No Limited to Skin Limited to Skin Breakdown Breakdown Epithelialization: None None N/A Periwound Skin Texture: No Abnormalities Noted No Abnormalities Noted N/A Periwound Skin Dry/Scaly: Yes Dry/Scaly: Yes N/A Moisture: Periwound Skin Color: No Abnormalities Noted No Abnormalities Noted N/A Temperature: No Abnormality No Abnormality N/A Tenderness on Yes Yes N/A Palpation: Wound Preparation: Ulcer Cleansing: Ulcer Cleansing: N/A Rinsed/Irrigated with Rinsed/Irrigated with Saline Saline Topical Anesthetic Topical Anesthetic Applied: Other: lidocaine Applied: Other: lidocaine 4% 4% Treatment  Notes Electronic Signature(s) Signed: 07/16/2016 5:38:41 PM By: [name] Entered By: [name] on 07/16/2016 16:29:51 [name] ([id number]) -------------------------------------------------------------------------------- Multi-Disciplinary Care Plan Details Patient Name: [name] Date of Service: 07/16/2016 3:45 PM Medical Record Number: [id number] Patient Account Number: [id number] Date of Birth/Sex: 1956-08-12 (60 y.o. Male) Treating RN: [name] Primary Care Physician: [name] Other Clinician: Referring Physician: Threasa Hughes Treating Physician/Extender: [name] in Treatment: 14 Active Inactive Abuse / Safety / Falls / Self Care Management Nursing Diagnoses: Potential for falls Goals: Patient will remain injury free Date Initiated: 04/08/2016 Goal Status: Active Interventions: Assess fall risk on admission and as needed Notes: Nutrition Nursing Diagnoses: Imbalanced nutrition Goals: Patient/caregiver agrees to and verbalizes understanding of need to use nutritional supplements and/or vitamins as prescribed Date Initiated: 04/08/2016 Goal Status: Active Interventions: Assess patient nutrition upon admission and as needed per policy Notes: Orientation to the Wound Care Program Nursing Diagnoses: Knowledge deficit related to the wound healing center program Goals: Patient/caregiver will verbalize understanding of the [facility] ([id number]) Date Initiated: 04/08/2016 Goal Status: Active Interventions: Provide education on orientation to the wound center Notes: Pain, Acute or Chronic Nursing Diagnoses: Pain, acute or chronic: actual or potential Potential alteration in comfort, pain Goals: Patient will verbalize adequate pain control and receive pain control interventions during procedures as needed Date Initiated: 04/08/2016 Goal Status: Active Interventions: Assess comfort goal upon  admission Complete pain assessment as per visit requirements Notes: Pressure Nursing Diagnoses: Knowledge deficit related to causes and risk factors for pressure ulcer development Knowledge deficit related to management of pressures ulcers Goals: Patient will remain free from development of additional pressure ulcers Date Initiated: 04/08/2016 Goal Status: Active Interventions: Assess offloading mechanisms upon admission and as needed Assess potential for pressure ulcer upon admission and as needed Notes: Wound/Skin Impairment Nursing Diagnoses: Impaired tissue integrity Eichhorn, Earlin ([id number]) Goals: Ulcer/skin breakdown will have a volume reduction of 30% by week 4 Date Initiated: 04/08/2016 Goal Status: Active Ulcer/skin breakdown will have a volume reduction of 50% by week 8 Date Initiated: 04/08/2016 Goal Status: Active Ulcer/skin breakdown will have a volume reduction of 80% by week 12 Date Initiated: 04/08/2016 Goal Status: Active Interventions: Assess ulceration(s) every visit Notes: Electronic Signature(s) Signed: 07/16/2016 5:38:41 PM By: [name] Entered By: [name] on 07/16/2016 16:29:25 Whitfill, Lorenso Hughes ([id number]) -------------------------------------------------------------------------------- Pain Assessment Details Patient Name: [name] Date of Service: 07/16/2016 3:45 PM Medical Record Number: [id number] Patient Account Number: [id number] Date of Birth/Sex: 10-Aug-1956 (60 y.o. Male) Treating RN: [name] Primary Care Physician: [name] Other Clinician: Referring Physician: Threasa Hughes Treating Physician/Extender: Larry Dillon  Hughes in Treatment: 14 Active Problems Location of Pain Severity and Description of Pain Patient Has Paino No Site Locations Pain Management and Medication Current Pain Management: Notes Topical or injectable lidocaine is offered to patient for acute pain when surgical debridement is  performed. If needed, Patient is instructed to use over the counter pain medication for the following 24-48 hours after debridement. Wound care MDs do not prescribed pain medications. Patient has chronic pain or uncontrolled pain. Patient has been instructed to make an appointment with their Primary Care Physician for pain management. Electronic Signature(s) Signed: 07/16/2016 5:38:41 PM By: [name] Entered By: [name] on 07/16/2016 16:02:42 [name] ([id number]) -------------------------------------------------------------------------------- Patient/Caregiver Education Details Patient Name: [name] Date of Service: 07/16/2016 3:45 PM Medical Record Number: [id number] Patient Account Number: [id number] Date of Birth/Gender: 1956/04/04 (60 y.o. Male) Treating RN: [name] Primary Care Physician: [name] Other Clinician: Referring Physician: Threasa Hughes Treating Physician/Extender: [name] in Treatment: 14 Education Assessment Education Provided To: Patient and Caregiver Education Topics Provided Wound/Skin Impairment: Handouts: Other: wound care as ordered Methods: Demonstration, Explain/Verbal Responses: State content correctly Electronic Signature(s) Signed: 07/16/2016 5:38:41 PM By: [name] Entered By: [name] on 07/16/2016 16:47:58 Billick, Anden ([id number]) -------------------------------------------------------------------------------- Wound Assessment Details Patient Name: [name] Date of Service: 07/16/2016 3:45 PM Medical Record Number: [id number] Patient Account Number: [id number] Date of Birth/Sex: 27-Jul-1956 (60 y.o. Male) Treating RN: [name] Primary Care Physician: [name] Other Clinician: Referring Physician: Threasa Hughes Treating Physician/Extender: [name] in Treatment: 14 Wound Status Wound Number: 3 Primary Etiology: Pressure Ulcer Wound Location: Left  Calcaneus Wound Status: Open Wounding Event: Pressure Injury Comorbid History: Anemia Date Acquired: 02/07/2016 Hughes Of Treatment: 14 Clustered Wound: No Photos Wound Measurements Length: (cm) 1.7 Width: (cm) 6 Depth: (cm) 0.1 Area: (cm) 8.011 Volume: (cm) 0.801 % Reduction in Area: 62.5% % Reduction in Volume: 62.5% Epithelialization: None Tunneling: No Undermining: No Wound Description Classification: Category/Stage II Wound Margin: Flat and Intact Exudate Amount: Large Exudate Type: Purulent Exudate Color: yellow, brown, Clinkscale Foul Odor After Cleansing: Yes Due to Product Use: No Wound Bed Granulation Amount: Large (67-100%) Exposed Structure Granulation Quality: Red Fascia Exposed: No Necrotic Amount: Small (1-33%) Fat Layer Exposed: No Necrotic Quality: Eschar, Adherent Slough Tendon Exposed: No Muscle Exposed: No Gade, Darik ([id number]) Joint Exposed: No Bone Exposed: No Limited to Skin Breakdown Periwound Skin Texture Texture Color No Abnormalities Noted: No No Abnormalities Noted: No Moisture Temperature / Pain No Abnormalities Noted: No Temperature: No Abnormality Dry / Scaly: Yes Tenderness on Palpation: Yes Wound Preparation Ulcer Cleansing: Rinsed/Irrigated with Saline Topical Anesthetic Applied: Other: lidocaine 4%, Treatment Notes Wound #3 (Left Calcaneus) 1. Cleansed with: Clean wound with Normal Saline 2. Anesthetic Topical Lidocaine 4% cream to wound bed prior to debridement 4. Dressing Applied: Santyl Ointment 5. Secondary Dressing Applied Guaze, ABD and kerlix/Conform 7. Secured with Recruitment consultant) Signed: 07/16/2016 5:38:41 PM By: [name] Entered By: [name] on 07/16/2016 16:17:10 Vizcarrondo, Lorenso Hughes ([id number]) -------------------------------------------------------------------------------- Wound Assessment Details Patient Name: [name] Date of Service: 07/16/2016 3:45 PM Medical Record Number:  [id number] Patient Account Number: [id number] Date of Birth/Sex: 1956-09-04 (60 y.o. Male) Treating RN: [name] Primary Care Physician: [name] Other Clinician: Referring Physician: Threasa Hughes Treating Physician/Extender: [name] in Treatment: 14 Wound Status Wound Number: 4 Primary Etiology: Pressure Ulcer Wound Location: Right Calcaneus Wound Status: Open Wounding Event: Pressure Injury Comorbid History: Anemia Date Acquired: 02/07/2016 Hughes Of Treatment:  14 Clustered Wound: No Photos Wound Measurements Length: (cm) 1 Width: (cm) 2.9 Depth: (cm) 0.1 Area: (cm) 2.278 Volume: (cm) 0.228 % Reduction in Area: 81.9% % Reduction in Volume: 81.9% Epithelialization: None Tunneling: No Undermining: No Wound Description Classification: Category/Stage II Wound Margin: Flat and Intact Exudate Amount: Large Exudate Type: Purulent Exudate Color: yellow, brown, Grattan Foul Odor After Cleansing: Yes Due to Product Use: No Wound Bed Granulation Amount: Large (67-100%) Exposed Structure Granulation Quality: Red Fascia Exposed: No Necrotic Amount: Small (1-33%) Fat Layer Exposed: No Necrotic Quality: Eschar Tendon Exposed: No Muscle Exposed: No Broker, Wade ([id number]) Joint Exposed: No Bone Exposed: No Limited to Skin Breakdown Periwound Skin Texture Texture Color No Abnormalities Noted: No No Abnormalities Noted: No Moisture Temperature / Pain No Abnormalities Noted: No Temperature: No Abnormality Dry / Scaly: Yes Tenderness on Palpation: Yes Wound Preparation Ulcer Cleansing: Rinsed/Irrigated with Saline Topical Anesthetic Applied: Other: lidocaine 4%, Treatment Notes Wound #4 (Right Calcaneus) 1. Cleansed with: Clean wound with Normal Saline 2. Anesthetic Topical Lidocaine 4% cream to wound bed prior to debridement 4. Dressing Applied: Santyl Ointment 5. Secondary Dressing Applied Guaze, ABD and kerlix/Conform 7.  Secured with Recruitment consultant) Signed: 07/16/2016 5:38:41 PM By: [name] Entered By: [name] on 07/16/2016 16:17:36 Bursch, Lorenso Hughes ([id number]) -------------------------------------------------------------------------------- Vitals Details Patient Name: [name] Date of Service: 07/16/2016 3:45 PM Medical Record Number: [id number] Patient Account Number: [id number] Date of Birth/Sex: 03-18-1956 (60 y.o. Male) Treating RN: [name] Primary Care Physician: [name] Other Clinician: Referring Physician: Threasa Hughes Treating Physician/Extender: [name] in Treatment: 14 Vital Signs Time Taken: 16:04 Pulse (bpm): 56 Height (in): 70 Respiratory Rate (breaths/min): 16 Blood Pressure (mmHg): 132/89 Reference Range: 80 - 120 mg / dl Electronic Signature(s) Signed: 07/16/2016 5:38:41 PM By: [name] Entered By: [name] on 07/16/2016 16:05:56

## 2016-07-17 NOTE — Progress Notes (Signed)
[name] ([id number]) Visit Report for 07/16/2016 Chief Complaint Document Details Patient Name: LESLY, Hughes Date of Service: 07/16/2016 3:45 PM Medical Record Patient Account Number: [id number] Number: Treating RN: [name] 1956-01-14 (59 y.o. Other Clinician: Date of Birth/Sex: Male) Treating Leighanna Kirn Primary Care Physician: [name] Physician/Extender: G Referring Physician: Claudie Revering in Treatment: 14 Information Obtained from: Patient Chief Complaint The patient is here for wounds on his bilateral feet supposedly obtained during a hospitalization from 3/24 through 4/6 Electronic Signature(s) Signed: 07/17/2016 12:55:33 PM By: Linton Ham MD Entered By: Linton Ham on 07/16/2016 16:37:01 [name], [name] ([id number]) -------------------------------------------------------------------------------- Debridement Details Patient Name: [name] Date of Service: 07/16/2016 3:45 PM Medical Record Patient Account Number: [id number] Number: Treating RN: [name] 07-Oct-1956 (59 y.o. Other Clinician: Date of Birth/Sex: Male) Treating Vanesa Renier, [place] Primary Care Physician: [name] Physician/Extender: G Referring Physician: Claudie Revering in Treatment: 14 Debridement Performed for Wound #3 Left Calcaneus Assessment: Performed By: Physician [name], MD Debridement: Debridement Pre-procedure Yes - 16:32 Verification/Time Out Taken: Start Time: 16:32 Pain Control: Lidocaine 4% Topical Solution Level: Skin/Subcutaneous Tissue Total Area Debrided (L x 1.7 (cm) x 6 (cm) = 10.2 (cm) W): Tissue and other Viable, Non-Viable, Eschar, Fibrin/Slough, Subcutaneous material debrided: Instrument: Curette Bleeding: Minimum Hemostasis Achieved: Pressure End Time: 16:34 Procedural Pain: 0 Post Procedural Pain: 0 Response to Treatment: Procedure was tolerated well Post Debridement Measurements of  Total Wound Length: (cm) 1.7 Stage: Category/Stage II Width: (cm) 6 Depth: (cm) 0.1 Volume: (cm) 0.801 Character of Wound/Ulcer Post Requires Further Debridement: Debridement Severity of Tissue Post Fat layer exposed Debridement: Post Procedure Diagnosis Same as Pre-procedure Electronic Signature(s) Signed: 07/16/2016 5:38:41 PM By: Adolph Pollack, [name] ([id number]) Signed: 07/17/2016 12:55:33 PM By: Linton Ham MD Entered By: Linton Ham on 07/16/2016 16:36:19 [name] ([id number]) -------------------------------------------------------------------------------- Debridement Details Patient Name: [name] Date of Service: 07/16/2016 3:45 PM Medical Record Patient Account Number: [id number] Number: Treating RN: [name] 1956/05/03 (59 y.o. Other Clinician: Date of Birth/Sex: Male) Treating Toya Palacios, [place] Primary Care Physician: [name] Physician/Extender: G Referring Physician: Claudie Revering in Treatment: 14 Debridement Performed for Wound #4 Right Calcaneus Assessment: Performed By: Physician [name], MD Debridement: Debridement Pre-procedure Yes - 16:29 Verification/Time Out Taken: Start Time: 16:30 Pain Control: Lidocaine 4% Topical Solution Level: Skin/Subcutaneous Tissue Total Area Debrided (L x 1 (cm) x 2.9 (cm) = 2.9 (cm) W): Tissue and other Viable, Non-Viable, Eschar, Fibrin/Slough, Subcutaneous material debrided: Instrument: Curette Bleeding: Minimum Hemostasis Achieved: Pressure End Time: 16:32 Procedural Pain: 0 Post Procedural Pain: 0 Response to Treatment: Procedure was tolerated well Post Debridement Measurements of Total Wound Length: (cm) 1 Stage: Category/Stage II Width: (cm) 2.9 Depth: (cm) 0.1 Volume: (cm) 0.228 Character of Wound/Ulcer Post Requires Further Debridement: Debridement Severity of Tissue Post Fat layer exposed Debridement: Post Procedure Diagnosis Same as  Pre-procedure Electronic Signature(s) Signed: 07/16/2016 5:38:41 PM By: Adolph Pollack, [name] ([id number]) Signed: 07/17/2016 12:55:33 PM By: Linton Ham MD Entered By: Linton Ham on 07/16/2016 16:36:33 [name] ([id number]) -------------------------------------------------------------------------------- HPI Details Patient Name: [name] Date of Service: 07/16/2016 3:45 PM Medical Record Patient Account Number: [id number] Number: Treating RN: [name] [birth year] (59 y.o. Other Clinician: Date of Birth/Sex: Male) Treating Jordon Kristiansen Primary Care Physician: [name] Physician/Extender: G Referring Physician: Claudie Revering in Treatment: 14 History of Present Illness HPI Description: 04/08/16; this is a patient we really don't know too much about. He was hospitalized from 3/24  through 4/6 residing with a sodium of 167 creatinine of 2.49 right lower lobe pneumonia sepsis/septic shock syndrome. He apparently came back to [facility] He apparently had been there only 2 weeks before he went out. I know very little about him premorbidly. The hospital he had a CT scan of the head that showed atrophy and chronic small vessel white matter ischemic changes and remote infarcts also noted within the bilateral basal ganglia. His echocardiogram and presentation showed a EF of 20-25%, left ventricle was moderately dilated with diffuse hypokinesis. Apparently he return to his assisted living with wounds on his bilateral feet. According to the attendant came with him he could walk before he went out he could no longer walk now. There are wounds on both heels covered with a black eschar and also wounds on his bilateral dorsal feet. ABIs calculated in our clinic for 0.49 bilaterally. He was a smoker up to 2 months ago he is not smoking now. 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 Monophasic waves distally bilaterally. He is going back at the end of the month I believe June 29 probably for an arteriogram as arranged by Dr. Delana Meyer in the meantime he has had no real changes 04/30/16; we are waiting vascular review on June 29. The black eschar over his bilateral heels is beginning to separate using Santyl. The areas on the dorsal aspect of his feet looked considerably better/resolved. He apparently has no complaints. He also has a severe presumably ischemic cardiomyopathy as noted above 05/21/16:pt returns today for ongoing evaluation of wounds. I reviewed recent venous and arterial diagnostic findings. He is scheduled for a LLE angio on 05/26/16 and RLE angio on 06/02/16 for advanced evaluation of PAD as noted per lower extremity arterial duplex. BLE venous duplex was negative for venous reflux, DVT or insufficiency. 06/10/16; this is a patient I have not seen in over a month although he was seen here on 7/12. When I admitted him to our clinic I felt he had pressure ulcerations but also potential for limb threatening ischemia. I referred him urgently to vascular surgery. Since we have last seen him in this clinic he has undergone 2 procedures. On 7/17 he will underwent percutaneous transluminal angioplasty of the left above-knee popliteal artery as well as the mid to distal SFA. On 7/27 he underwent for cutaneous transluminal angioplasty of the right popliteal artery and SFA an angioplasty of the proximal SFA. He had a stent placement in the right popliteal artery. The patient is difficult to gauge symptomatology although he does not currently complain of pain. Our intake nurse reports purulent drainage out of the left heel and an odor. 06/17/2016 -- right heel x-ray and left heel x-ray -- IMPRESSION:No radiographic  evidence of osteomyelitis. Soft tissue changes are [place], Nicholad ([id number]) consistent with cellulitis. He had a wound culture which is positive for abundant Citrobacter fundi and pseudomonas aeruginosa. Both are sensitive to ciprofloxacin and at the present time the patient is on doxycycline 100 mg, prescribed by Dr. Dellia Nims last week. I will prescribe ciprofloxacin for him today. 07/16/16 it is been quite a while since I've seen these wounds however both of them are quite a bit better. Using [place], being followed by advanced home care. Electronic Signature(s) Signed: 07/17/2016 12:55:33 PM By:  Linton Ham MD Entered By: Linton Ham on 07/16/2016 16:38:09 [name] ([id number]) -------------------------------------------------------------------------------- Physical Exam Details Patient Name: [name] Date of Service: 07/16/2016 3:45 PM Medical Record Patient Account Number: [id number] Number: Treating RN: [name] Sep 25, 1956 (59 y.o. Other Clinician: Date of Birth/Sex: Male) Treating Caterina Racine Primary Care Physician: [name] Physician/Extender: G Referring Physician: Claudie Revering in Treatment: 14 Constitutional Sitting or standing Blood Pressure is within target range for patient.. Pulse regular and within target range for patient.Marland Kitchen Respirations regular, non-labored and within target range.. Temperature is normal and within the target range for the patient.. Patient is somewhat vague, restless however this appears to be his baseline function. Notes Wound exam; both wounds required debridement of the significant amount of surface slough and nonviable subcutaneous tissue. I used a curette. We will continue Santyl-based ointment for now Electronic Signature(s) Signed: 07/17/2016 12:55:33 PM By: Linton Ham MD Entered By: Linton Ham on 07/16/2016 16:39:25 [name]  ([id number]) -------------------------------------------------------------------------------- Physician Orders Details Patient Name: [name] Date of Service: 07/16/2016 3:45 PM Medical Record Patient Account Number: [id number] Number: Treating RN: [name] 02-20-1956 (59 y.o. Other Clinician: Date of Birth/Sex: Male) Treating Tyshan Enderle Primary Care Physician: [name] Physician/Extender: G Referring Physician: Claudie Revering in Treatment: 14 Verbal / Phone Orders: Yes Clinician: [name] Read Back and Verified: Yes Diagnosis Coding Wound Cleansing Wound #3 Left Calcaneus o Clean wound with Normal Saline. o Cleanse wound with mild soap and water Wound #4 Right Calcaneus o Clean wound with Normal Saline. o Cleanse wound with mild soap and water Anesthetic Wound #3 Left Calcaneus o Topical Lidocaine 4% cream applied to wound bed prior to debridement - office use only Wound #4 Right Calcaneus o Topical Lidocaine 4% cream applied to wound bed prior to debridement - office use only Primary Wound Dressing Wound #3 Left Calcaneus o Santyl Ointment Wound #4 Right Calcaneus o Santyl Ointment Secondary Dressing Wound #3 Left Calcaneus o ABD pad - foam to top of foot for protection o Dry Gauze o Conform/Kerlix - tape, netting Wound #4 Right Calcaneus o ABD pad - foam to top of foot for protection o Dry Gauze o Conform/Kerlix - tape, netting Oshea, Bailen ([id number]) Dressing Change Frequency Wound #3 Left Calcaneus o Change dressing every other day. Wound #4 Right Calcaneus o Change dressing every other day. Follow-up Appointments Wound #3 Left Calcaneus o Return Appointment in 2 weeks. Wound #4 Right Calcaneus o Return Appointment in 2 weeks. Off-Loading Wound #3 Left Calcaneus o Heel suspension boot to: - pt to wear sage boots, please float heels o Turn and reposition every 2  hours o Other: - [place] Lift Wound #4 Right Calcaneus o Heel suspension boot to: - pt to wear sage boots, please float heels o Turn and reposition every 2 hours o Other: - Advertising account planner Orders / Instructions Wound #3 Left Calcaneus o Increase protein intake. Wound #4 Right Calcaneus o Increase protein intake. Home Health Wound #3 Left Calcaneus o Continue Home Health Visits - [place] *****Please Order [place] for patient***** o Home Health Nurse may visit PRN to address patientos wound care needs. o FACE TO FACE ENCOUNTER: MEDICARE and MEDICAID PATIENTS: I certify that this patient is under my care and that I had a face-to-face encounter that meets the physician face-to-face encounter requirements with this patient on this date. The encounter with the patient was in whole or in part for the following MEDICAL CONDITION: (primary reason for  Home Healthcare) MEDICAL NECESSITY: I certify, that based on my findings, NURSING services are a medically necessary home health service. HOME BOUND STATUS: I certify that my clinical findings support that this patient is homebound (i.e., Due to illness or injury, pt requires aid of supportive devices such as crutches, cane, wheelchairs, walkers, the use of special transportation or the assistance of another person to leave their place of residence. There is a normal inability to leave the home and doing so requires considerable and taxing effort. Other absences are for medical reasons / religious services and are infrequent or of short duration when for other reasons). Lefeber, Holdyn ([id number]) o If current dressing causes regression in wound condition, may D/C ordered dressing product/s and apply Normal Saline Moist Dressing daily until next [place] / Other MD appointment. [place] of regression in wound condition at [phone]. o Please direct any NON-WOUND related  issues/requests for orders to patient's Primary Care Physician Wound #4 Right Calcaneus o [place] Visits - [place] *****Please Order [place] for patient***** o Home Health Nurse may visit PRN to address patientos wound care needs. o FACE TO FACE ENCOUNTER: MEDICARE and MEDICAID PATIENTS: I certify that this patient is under my care and that I had a face-to-face encounter that meets the physician face-to-face encounter requirements with this patient on this date. The encounter with the patient was in whole or in part for the following MEDICAL CONDITION: (primary reason for [place]) MEDICAL NECESSITY: I certify, that based on my findings, NURSING services are a medically necessary home health service. HOME BOUND STATUS: I certify that my clinical findings support that this patient is homebound (i.e., Due to illness or injury, pt requires aid of supportive devices such as crutches, cane, wheelchairs, walkers, the use of special transportation or the assistance of another person to leave their place of residence. There is a normal inability to leave the home and doing so requires considerable and taxing effort. Other absences are for medical reasons / religious services and are infrequent or of short duration when for other reasons). o If current dressing causes regression in wound condition, may D/C ordered dressing product/s and apply Normal Saline Moist Dressing daily until next [place] / Other MD appointment. [place] of regression in wound condition at [phone]. o Please direct any NON-WOUND related issues/requests for orders to patient's Primary Care Physician Medications-please add to medication list. o Santyl Enzymatic Ointment o Other: - Vitamin C, Zinc, Multivitamin Wound #4 Right Calcaneus o Santyl Enzymatic Ointment o Santyl Enzymatic Ointment o Other: - Vitamin C, Zinc, Multivitamin o  Other: - Vitamin C, Zinc, Multivitamin Electronic Signature(s) Signed: 07/16/2016 5:38:41 PM By: [name] Signed: 07/17/2016 12:55:33 PM By: Linton Ham MD Entered By: [name] on 07/16/2016 16:34:50 [name] ([id number]) -------------------------------------------------------------------------------- Problem List Details Patient Name: [name] Date of Service: 07/16/2016 3:45 PM Medical Record Patient Account Number: [id number] Number: Treating RN: [name] May 03, 1956 (59 y.o. 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 Other Clinician: Date of Birth/Sex: Male) Treating Maribelle Hopple Primary Care Physician: [name] Physician/Extender: G Referring Physician: Claudie Revering in Treatment: 14 Subjective Chief Complaint Information obtained from Patient The patient is here for wounds on his bilateral feet  supposedly obtained during a hospitalization from 3/24 through 4/6 History of Present Illness (HPI) 04/08/16; this is a patient we really don't know too much about. He was hospitalized from 3/24 through 4/6 residing with a sodium of 167 creatinine of 2.49 right lower lobe pneumonia sepsis/septic shock syndrome. He apparently came back to [facility] He apparently had been there only 2 weeks before he went out. I know very little about him premorbidly. The hospital he had a CT scan of the head that showed atrophy and chronic small vessel white matter ischemic changes and remote infarcts also noted within the bilateral basal ganglia. His echocardiogram and presentation showed a EF of 20-25%, left ventricle was moderately dilated with diffuse hypokinesis. Apparently he return to his assisted living with wounds on his bilateral feet. According to the attendant came with him he could walk before he went out he could no longer walk now. There are wounds on both heels covered with a black eschar and also wounds on his bilateral dorsal feet. ABIs calculated in our clinic for 0.49 bilaterally. He was a smoker up to 2 months ago he is not smoking now. 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 Monophasic waves distally bilaterally. He is going back at the end of the month I believe June 29 probably for an arteriogram as arranged by Dr. Delana Meyer in the meantime he has had no real changes 04/30/16; we are waiting vascular review on June 29. The black eschar over his bilateral heels is beginning to separate using Santyl. The areas on the dorsal aspect of his feet looked considerably better/resolved. He apparently has no complaints. He also has a severe presumably ischemic cardiomyopathy  as noted above 05/21/16:pt returns today for ongoing evaluation of wounds. I reviewed recent venous and arterial diagnostic findings. He is scheduled for a LLE angio on 05/26/16 and RLE angio on 06/02/16 for advanced evaluation of PAD as noted per lower extremity arterial duplex. BLE venous duplex was negative for venous reflux, DVT or insufficiency. 06/10/16; this is a patient I have not seen in over a month although he was seen here on 7/12. When I admitted him to our clinic I felt he had pressure ulcerations but also potential for limb threatening ischemia. I referred him urgently to vascular surgery. Since we have last seen him in this clinic he has undergone 2 procedures. On 7/17 he will underwent percutaneous transluminal angioplasty of the left above-knee popliteal artery as well as the mid to distal SFA. On 7/27 he underwent for cutaneous transluminal Corkery, Seraphim ([id number]) angioplasty of the right popliteal artery and SFA an angioplasty of the proximal SFA. He had a stent placement in the right popliteal artery. The patient is difficult to gauge symptomatology  although he does not currently complain of pain. Our intake nurse reports purulent drainage out of the left heel and an odor. 06/17/2016 -- right heel x-ray and left heel x-ray -- IMPRESSION:No radiographic evidence of osteomyelitis. Soft tissue changes are consistent with cellulitis. He had a wound culture which is positive for abundant Citrobacter fundi and pseudomonas aeruginosa. Both are sensitive to ciprofloxacin and at the present time the patient is on doxycycline 100 mg, prescribed by Dr. Dellia Nims last week. I will prescribe ciprofloxacin for him today. 07/16/16 it is been quite a while since I've seen these wounds however both of them are quite a bit better. Using [place], being followed by advanced home care. Objective Constitutional Sitting or standing Blood Pressure is within target range for patient.. Pulse regular and  within target range for patient.Marland Kitchen Respirations regular, non-labored and within target range.. Temperature is normal and within the target range for the patient.. Patient is somewhat vague, restless however this appears to be his baseline function. Vitals Time Taken: 4:04 PM, Height: 70 in, Pulse: 56 bpm, Respiratory Rate: 16 breaths/min, Blood Pressure: 132/89 mmHg. General Notes: Wound exam; both wounds required debridement of the significant amount of surface slough and nonviable subcutaneous tissue. I used a curette. We will continue Santyl-based ointment for now Integumentary (Hair, Skin) Wound #3 status is Open. Original cause of wound was Pressure Injury. The wound is located on the Left Calcaneus. The wound measures 1.7cm length x 6cm width x 0.1cm depth; 8.011cm^2 area and 0.801cm^3 volume. The wound is limited to skin breakdown. There is no tunneling or undermining noted. There is a large amount of purulent drainage noted. The wound margin is flat and intact. There is large (67- 100%) red granulation within the wound bed. There is a small (1-33%) amount of necrotic tissue within the wound bed including Eschar and Adherent Slough. The periwound skin appearance exhibited: Dry/Scaly. Periwound temperature was noted as No Abnormality. The periwound has tenderness on palpation. Wound #4 status is Open. Original cause of wound was Pressure Injury. The wound is located on the Right Calcaneus. The wound measures 1cm length x 2.9cm width x 0.1cm depth; 2.278cm^2 area and 0.228cm^3 volume. The wound is limited to skin breakdown. There is no tunneling or undermining noted. There is a large amount of purulent drainage noted. The wound margin is flat and intact. There is large (67- 100%) red granulation within the wound bed. There is a small (1-33%) amount of necrotic tissue within the D[place], Quinlan ([id number]) wound bed including Eschar. The periwound skin appearance exhibited: Dry/Scaly.  Periwound temperature was noted as No Abnormality. The periwound has tenderness on palpation. 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 There was a Skin/Subcutaneous Tissue Debridement [id number]) debridement with total area of 10.2 sq cm performed by [name], MD. with the following instrument(s): Curette to remove Viable and Non-Viable tissue/material including Fibrin/Slough, Eschar, and Subcutaneous after achieving pain control using Lidocaine 4% Topical Solution. A time out was conducted at 16:32, prior to the start of the procedure. A Minimum amount of bleeding was controlled with Pressure. The procedure was tolerated well with a pain level of 0 throughout and  a pain level of 0 following the procedure. Post Debridement Measurements: 1.7cm length x 6cm width x 0.1cm depth; 0.801cm^3 volume. Post debridement Stage noted as Category/Stage II. Character of Wound/Ulcer Post Debridement requires further debridement. Severity of Tissue Post Debridement is: Fat layer exposed. Post procedure Diagnosis Wound #3: Same as Pre-Procedure Wound #4 Wound #4 is a Pressure Ulcer located on the Right Calcaneus . There was a Skin/Subcutaneous Tissue Debridement [id number]) debridement with total area of 2.9 sq cm performed by [name], MD. with the following instrument(s): Curette to remove Viable and Non-Viable tissue/material including Fibrin/Slough, Eschar, and Subcutaneous after achieving pain control using Lidocaine 4% Topical Solution. A time out was conducted at 16:29, prior to the start of the procedure. A Minimum  amount of bleeding was controlled with Pressure. The procedure was tolerated well with a pain level of 0 throughout and a pain level of 0 following the procedure. Post Debridement Measurements: 1cm length x 2.9cm width x 0.1cm depth; 0.228cm^3 volume. Post debridement Stage noted as Category/Stage II. Character of Wound/Ulcer Post Debridement requires further debridement. Severity of Tissue Post Debridement is: Fat layer exposed. Post procedure Diagnosis Wound #4: Same as Pre-Procedure Hopwood, Karsyn ([id number]) Plan Wound Cleansing: Wound #3 Left Calcaneus: Clean wound with Normal Saline. Cleanse wound with mild soap and water Wound #4 Right Calcaneus: Clean wound with Normal Saline. Cleanse wound with mild soap and water Anesthetic: Wound #3 Left Calcaneus: Topical Lidocaine 4% cream applied to wound bed prior to debridement - office use only Wound #4 Right Calcaneus: Topical Lidocaine 4% cream applied to wound bed prior to debridement - office use only Primary Wound Dressing: Wound #3 Left Calcaneus: Santyl Ointment Wound #4 Right Calcaneus: Santyl Ointment Secondary Dressing: Wound #3 Left Calcaneus: ABD pad - foam to top of foot for protection Dry Gauze Conform/Kerlix - tape, netting Wound #4 Right Calcaneus: ABD pad - foam to top of foot for protection Dry Gauze Conform/Kerlix - tape, netting Dressing Change Frequency: Wound #3 Left Calcaneus: Change dressing every other day. Wound #4 Right Calcaneus: Change dressing every other day. Follow-up Appointments: Wound #3 Left Calcaneus: Return Appointment in 2 weeks. Wound #4 Right Calcaneus: Return Appointment in 2 weeks. Off-Loading: Wound #3 Left Calcaneus: Heel suspension boot to: - pt to wear sage boots, please float heels Turn and reposition every 2 hours Other: - Hoyer Lift Wound #4 Right Calcaneus: Caswell, Basilio ([id number]) Heel suspension boot to: - pt to wear sage boots, please float heels Turn and  reposition every 2 hours Other: - Advertising account planner Orders / Instructions: Wound #3 Left Calcaneus: Increase protein intake. Wound #4 Right Calcaneus: Increase protein intake. Home Health: Wound #3 Left Calcaneus: Continue Home Health Visits - [place] *****Please Order [place] for patient***** Home Health Nurse may visit PRN to address patient s wound care needs. FACE TO FACE ENCOUNTER: MEDICARE and MEDICAID PATIENTS: I certify that this patient is under my care and that I had a face-to-face encounter that meets the physician face-to-face encounter requirements with this patient on this date. The encounter with the patient was in whole or in part for the following MEDICAL CONDITION: (primary reason for [place]) MEDICAL NECESSITY: I certify, that based on my findings, NURSING services are a medically necessary home health service. HOME BOUND STATUS: I certify that my clinical findings support that this patient is homebound (i.e., Due to illness or injury, pt requires aid of supportive devices such as crutches, cane, wheelchairs, walkers, the use of  special transportation or the assistance of another person to leave their place of residence. There is a normal inability to leave the home and doing so requires considerable and taxing effort. Other absences are for medical reasons / religious services and are infrequent or of short duration when for other reasons). If current dressing causes regression in wound condition, may D/C ordered dressing product/s and apply Normal Saline Moist Dressing daily until next [place] / Other MD appointment. [place] of regression in wound condition at [phone]. Please direct any NON-WOUND related issues/requests for orders to patient's Primary Care Physician Wound #4 Right Calcaneus: [place] Visits - [place] *****Please Order [place] for patient***** Home Health Nurse may  visit PRN to address patient s wound care needs. FACE TO FACE ENCOUNTER: MEDICARE and MEDICAID PATIENTS: I certify that this patient is under my care and that I had a face-to-face encounter that meets the physician face-to-face encounter requirements with this patient on this date. The encounter with the patient was in whole or in part for the following MEDICAL CONDITION: (primary reason for [place]) MEDICAL NECESSITY: I certify, that based on my findings, NURSING services are a medically necessary home health service. HOME BOUND STATUS: I certify that my clinical findings support that this patient is homebound (i.e., Due to illness or injury, pt requires aid of supportive devices such as crutches, cane, wheelchairs, walkers, the use of special transportation or the assistance of another person to leave their place of residence. There is a normal inability to leave the home and doing so requires considerable and taxing effort. Other absences are for medical reasons / religious services and are infrequent or of short duration when for other reasons). If current dressing causes regression in wound condition, may D/C ordered dressing product/s and apply Normal Saline Moist Dressing daily until next [place] / Other MD appointment. [place] of regression in wound condition at [phone]. Please direct any NON-WOUND related issues/requests for orders to patient's Primary Care Physician Medications-please add to medication list.: Santyl Enzymatic Ointment Other: - Vitamin C, Zinc, Multivitamin Wound #4 Right Calcaneus: Santyl Enzymatic Ointment Santyl Enzymatic Ointment Other: - Vitamin C, Zinc, Multivitamin Other: - Vitamin C, Zinc, Multivitamin [place]linger, Traveion ([id number]) Both heels debrided continue snatyl Electronic Signature(s) Signed: 07/17/2016 12:55:33 PM By: Linton Ham MD Entered By: Linton Ham on 07/16/2016 16:39:55 Bellissimo, [name]  ([id number]) -------------------------------------------------------------------------------- SuperBill Details Patient Name: [name] Date of Service: 07/16/2016 Medical Record Patient Account Number: [id number] Number: Treating RN: [name] 1956-04-17 (59 y.o. 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 Facility Procedures CPT4 Code: [id number] Description: [id number] - DEB SUBQ TISSUE 20 SQ CM/< ICD-10 Description Diagnosis L89.610 Pressure ulcer of right heel, unstageable L89.620 Pressure ulcer of left heel, unstageable Modifier: Quantity: 1 Physician Procedures CPT4 Code: [id number] Description: [id number] - WC PHYS SUBQ TISS 20 SQ CM ICD-10 Description Diagnosis L89.610 Pressure ulcer of right heel, unstageable L89.620 Pressure ulcer of left heel, unstageable Modifier: Quantity: 1 Electronic Signature(s) Signed: 07/17/2016 12:55:33 PM By: Linton Ham MD Entered By: Linton Ham on  07/16/2016 16:40:38 

## 2016-07-30 ENCOUNTER — Encounter: Payer: Medicare Other | Admitting: Internal Medicine

## 2016-07-30 DIAGNOSIS — L89610 Pressure ulcer of right heel, unstageable: Secondary | ICD-10-CM | POA: Diagnosis not present

## 2016-07-31 NOTE — Progress Notes (Signed)
[name] ([id number]) Visit Report for 07/30/2016 Arrival Information Details Patient Name: [name], [name] Date of Service: 07/30/2016 1:30 PM Medical Record Number: [id number] Patient Account Number: [id number] Date of Birth/Sex: 28-Apr-1956 (60 y.o. Male) Treating RN: Baruch Gouty, RN, BSN, Velva Harman Primary Care Physician: [name] Other Clinician: Referring Physician: [name] Treating Physician/Extender: [name] in Treatment: 16 Visit Information History Since Last Visit All ordered tests and consults were completed: No Patient Arrived: Wheel Chair Added or deleted any medications: No Arrival Time: 13:33 Any new allergies or adverse reactions: No Accompanied By: caregivers Had a fall or experienced change in No activities of daily living that may affect Transfer Assistance: None risk of falls: Patient Identification Verified: Yes Signs or symptoms of abuse/neglect since last No Secondary Verification Process Yes visito Completed: Hospitalized since last visit: No Patient Requires Transmission-Based No Has Dressing in Place as Prescribed: Yes Precautions: Pain Present Now: No Patient Has Alerts: No Electronic Signature(s) Signed: 07/30/2016 5:37:17 PM By: Regan Lemming BSN, RN Entered By: Regan Lemming on 07/30/2016 13:33:39 [name] ([id number]) -------------------------------------------------------------------------------- Encounter Discharge Information Details Patient Name: [name] Date of Service: 07/30/2016 1:30 PM Medical Record Number: [id number] Patient Account Number: [id number] Date of Birth/Sex: 10-05-1956 (60 y.o. Male) Treating RN: Baruch Gouty, RN, BSN, [place] Harman Primary Care Physician: [name] Other Clinician: Referring Physician: [name] Treating Physician/Extender: [name] in Treatment: 16 Encounter Discharge Information Items Discharge Pain Level: 0 Discharge Condition: Stable Ambulatory Status:  Wheelchair Nursing Discharge Destination: Home Transportation: Other Accompanied By: caregiver Schedule Follow-up Appointment: No Medication Reconciliation completed No and provided to Patient/Care Mohini Heathcock: Clinical Summary of Care: Electronic Signature(s) Signed: 07/30/2016 5:37:17 PM By: Regan Lemming BSN, RN Previous Signature: 07/30/2016 1:58:42 PM Version By: [name] Entered By: Regan Lemming on 07/30/2016 13:58:47 Kanan, [name] ([id number]) -------------------------------------------------------------------------------- Lower Extremity Assessment Details Patient Name: [name] Date of Service: 07/30/2016 1:30 PM Medical Record Number: [id number] Patient Account Number: [id number] Date of Birth/Sex: Jan 13, 1956 (60 y.o. Male) Treating RN: Baruch Gouty, RN, BSN, Velva Harman Primary Care Physician: [name] Other Clinician: Referring Physician: [name] Treating Physician/Extender: [name] in Treatment: 16 Vascular Assessment Pulses: Posterior Tibial Dorsalis Pedis Palpable: [Left:Yes] [Right:Yes] Extremity colors, hair growth, and conditions: Extremity Color: [Left:Dusky] [Right:Dusky] Hair Growth on Extremity: [Left:Yes] [Right:Yes] Temperature of Extremity: [Left:Warm] [Right:Warm] Capillary Refill: [Left:< 3 seconds] [Right:< 3 seconds] Electronic Signature(s) Signed: 07/30/2016 5:37:17 PM By: Regan Lemming BSN, RN Entered By: Regan Lemming on 07/30/2016 13:37:46 Lave, Ry ([id number]) -------------------------------------------------------------------------------- Multi Wound Chart Details Patient Name: [name] Date of Service: 07/30/2016 1:30 PM Medical Record Number: [id number] Patient Account Number: [id number] Date of Birth/Sex: 10-Oct-1956 (60 y.o. Male) Treating RN: Baruch Gouty, RN, BSN, Velva Harman Primary Care Physician: [name] Other Clinician: Referring Physician: [name] Treating Physician/Extender: [name] in  Treatment: 16 Vital Signs Height(in): 70 Pulse(bpm): 76 Weight(lbs): Blood Pressure 121/78 (mmHg): Body Mass Index(BMI): Temperature(F): 97.8 Respiratory Rate 19 (breaths/min): Photos: [3:No Photos] [4:No Photos] [N/A:N/A] Wound Location: [3:Left Calcaneus] [4:Right Calcaneus] [N/A:N/A] Wounding Event: [3:Pressure Injury] [4:Pressure Injury] [N/A:N/A] Primary Etiology: [3:Pressure Ulcer] [4:Pressure Ulcer] [N/A:N/A] Comorbid History: [3:Anemia] [4:Anemia] [N/A:N/A] Date Acquired: [3:02/07/2016] [4:02/07/2016] [N/A:N/A] Weeks of Treatment: [3:16] [4:16] [N/A:N/A] Wound Status: [3:Open] [4:Open] [N/A:N/A] Measurements L x W x D 1.7x5x0.2 [4:1x2.5x0.2] [N/A:N/A] (cm) Area (cm) : [3:6.676] [4:1.963] [N/A:N/A] Volume (cm) : [3:1.335] [4:0.393] [N/A:N/A] % Reduction in Area: [3:68.70%] [4:84.40%] [N/A:N/A] % Reduction in Volume: 37.50% [4:68.70%] [N/A:N/A] Classification: [3:Category/Stage II] [4:Category/Stage II] [N/A:N/A] Exudate Amount: [3:Large] [4:Large] [N/A:N/A] Exudate Type: [  3:Purulent] [4:Purulent] [N/A:N/A] Exudate Color: [3:yellow, brown, [place]] [4:yellow, brown, Shahin] [N/A:N/A] Foul Odor After [3:Yes] [4:Yes] [N/A:N/A] Cleansing: Odor Anticipated Due to No [4:No] [N/A:N/A] Product Use: Wound Margin: [3:Flat and Intact] [4:Flat and Intact] [N/A:N/A] Granulation Amount: [3:Large (67-100%)] [4:Large (67-100%)] [N/A:N/A] Granulation Quality: [3:Red] [4:Red] [N/A:N/A] Necrotic Amount: [3:Small (1-33%)] [4:Small (1-33%)] [N/A:N/A] Necrotic Tissue: [3:Eschar, Adherent Slough] [4:Eschar] [N/A:N/A] Exposed Structures: [3:Fascia: No Fat: No] [4:Fascia: No Fat: No] [N/A:N/A] Tendon: No Tendon: No Muscle: No Muscle: No Joint: No Joint: No Bone: No Bone: No Limited to Skin Limited to Skin Breakdown Breakdown Epithelialization: Small (1-33%) Small (1-33%) N/A Periwound Skin Texture: Edema: No Edema: No N/A Excoriation: No Excoriation: No Induration:  No Induration: No Callus: No Callus: No Crepitus: No Crepitus: No Fluctuance: No Fluctuance: No Friable: No Friable: No Rash: No Rash: No Scarring: No Scarring: No Periwound Skin Moist: Yes Moist: Yes N/A Moisture: Dry/Scaly: Yes Dry/Scaly: Yes Maceration: No Maceration: No Periwound Skin Color: Atrophie Blanche: No Atrophie Blanche: No N/A Cyanosis: No Cyanosis: No Ecchymosis: No Ecchymosis: No Erythema: No Erythema: No Hemosiderin Staining: No Hemosiderin Staining: No Mottled: No Mottled: No Pallor: No Pallor: No Rubor: No Rubor: No Temperature: No Abnormality No Abnormality N/A Tenderness on Yes Yes N/A Palpation: Wound Preparation: Ulcer Cleansing: Ulcer Cleansing: N/A Rinsed/Irrigated with Rinsed/Irrigated with Saline Saline Topical Anesthetic Topical Anesthetic Applied: Other: lidocaine Applied: Other: lidocaine 4% 4% Treatment Notes Electronic Signature(s) Signed: 07/30/2016 5:37:17 PM By: Regan Lemming BSN, RN Entered By: Regan Lemming on 07/30/2016 13:47:28 Rexrode, [name] ([id number]) -------------------------------------------------------------------------------- Multi-Disciplinary Care Plan Details Patient Name: [name] Date of Service: 07/30/2016 1:30 PM Medical Record Number: [id number] Patient Account Number: [id number] Date of Birth/Sex: 24-Jul-1956 (60 y.o. Male) Treating RN: Baruch Gouty, RN, BSN, [place] Primary Care Physician: [name] Other Clinician: Referring Physician: [name] Treating Physician/Extender: [name] in Treatment: 16 Active Inactive Abuse / Safety / Falls / Self Care Management Nursing Diagnoses: Potential for falls Goals: Patient will remain injury free Date Initiated: 04/08/2016 Goal Status: Active Interventions: Assess fall risk on admission and as needed Notes: Nutrition Nursing Diagnoses: Imbalanced nutrition Goals: Patient/caregiver agrees to and verbalizes understanding of need to  use nutritional supplements and/or vitamins as prescribed Date Initiated: 04/08/2016 Goal Status: Active Interventions: Assess patient nutrition upon admission and as needed per policy Notes: Orientation to the Wound Care Program Nursing Diagnoses: Knowledge deficit related to the wound healing center program Goals: Patient/caregiver will verbalize understanding of the [place] ([id number]) Date Initiated: 04/08/2016 Goal Status: Active Interventions: Provide education on orientation to the wound center Notes: Pain, Acute or Chronic Nursing Diagnoses: Pain, acute or chronic: actual or potential Potential alteration in comfort, pain Goals: Patient will verbalize adequate pain control and receive pain control interventions during procedures as needed Date Initiated: 04/08/2016 Goal Status: Active Interventions: Assess comfort goal upon admission Complete pain assessment as per visit requirements Notes: Pressure Nursing Diagnoses: Knowledge deficit related to causes and risk factors for pressure ulcer development Knowledge deficit related to management of pressures ulcers Goals: Patient will remain free from development of additional pressure ulcers Date Initiated: 04/08/2016 Goal Status: Active Interventions: Assess offloading mechanisms upon admission and as needed Assess potential for pressure ulcer upon admission and as needed Notes: Wound/Skin Impairment Nursing Diagnoses: Impaired tissue integrity Gunby, [place] ([id number]) Goals: Ulcer/skin breakdown will have a volume reduction of 30% by week 4 Date Initiated: 04/08/2016 Goal Status: Active Ulcer/skin breakdown will have a volume reduction of 50% by week 8 Date Initiated:  04/08/2016 Goal Status: Active Ulcer/skin breakdown will have a volume reduction of 80% by week 12 Date Initiated: 04/08/2016 Goal Status: Active Interventions: Assess ulceration(s) every  visit Notes: Electronic Signature(s) Signed: 07/30/2016 5:37:17 PM By: Regan Lemming BSN, RN Entered By: Regan Lemming on 07/30/2016 13:45:42 Boback, Graden ([id number]) -------------------------------------------------------------------------------- Pain Assessment Details Patient Name: [name] Date of Service: 07/30/2016 1:30 PM Medical Record Number: [id number] Patient Account Number: [id number] Date of Birth/Sex: [birth year] (60 y.o. Male) Treating RN: Baruch Gouty, RN, BSN, [place] Primary Care Physician: [name] Other Clinician: Referring Physician: [name] Treating Physician/Extender: [name] in Treatment: 16 Active Problems Location of Pain Severity and Description of Pain Patient Has Paino No Site Locations With Dressing Change: No Pain Management and Medication Current Pain Management: Electronic Signature(s) Signed: 07/30/2016 5:37:17 PM By: Regan Lemming BSN, RN Entered By: Regan Lemming on 07/30/2016 13:33:48 Vantil, [name] ([id number]) -------------------------------------------------------------------------------- Patient/Caregiver Education Details Patient Name: [name] Date of Service: 07/30/2016 1:30 PM Medical Record Number: [id number] Patient Account Number: [id number] Date of Birth/Gender: January 08, 1956 (60 y.o. Male) Treating RN: Baruch Gouty, RN, BSN, [place] Primary Care Physician: [name] Other Clinician: Referring Physician: [name] Treating Physician/Extender: [name] in Treatment: 16 Education Assessment Education Provided To: Patient and Caregiver Education Topics Provided Welcome To [facility]: Methods: Explain/Verbal Responses: State content correctly Wound Debridement: Methods: Explain/Verbal Responses: State content correctly Wound/Skin Impairment: Methods: Explain/Verbal Responses: State content correctly Electronic Signature(s) Signed: 07/30/2016 5:37:17 PM By: Regan Lemming BSN,  RN Entered By: Regan Lemming on 07/30/2016 14:00:34 Laskaris, [name] ([id number]) -------------------------------------------------------------------------------- Wound Assessment Details Patient Name: [name] Date of Service: 07/30/2016 1:30 PM Medical Record Number: [id number] Patient Account Number: [id number] Date of Birth/Sex: 1956-02-02 (60 y.o. Male) Treating RN: Baruch Gouty, RN, BSN, [place] Primary Care Physician: [name] Other Clinician: Referring Physician: [name] Treating Physician/Extender: [name] in Treatment: 16 Wound Status Wound Number: 3 Primary Etiology: Pressure Ulcer Wound Location: Left Calcaneus Wound Status: Open Wounding Event: Pressure Injury Comorbid History: Anemia Date Acquired: 02/07/2016 Weeks Of Treatment: 16 Clustered Wound: No Photos Photo Uploaded By: Regan Lemming on 07/30/2016 16:56:01 Wound Measurements Length: (cm) 1.7 Width: (cm) 5 Depth: (cm) 0.2 Area: (cm) 6.676 Volume: (cm) 1.335 % Reduction in Area: 68.7% % Reduction in Volume: 37.5% Epithelialization: Small (1-33%) Tunneling: No Undermining: No Wound Description Classification: Category/Stage II Wound Margin: Flat and Intact Exudate Amount: Large Exudate Type: Purulent Exudate Color: yellow, brown, [place] Foul Odor After Cleansing: Yes Due to Product Use: No Wound Bed Granulation Amount: Large (67-100%) Exposed Structure Granulation Quality: Red Fascia Exposed: No Necrotic Amount: Small (1-33%) Fat Layer Exposed: No Necrotic Quality: Eschar, Adherent Slough Tendon Exposed: No Fahrner, Zarius ([id number]) Muscle Exposed: No Joint Exposed: No Bone Exposed: No Limited to Skin Breakdown Periwound Skin Texture Texture Color No Abnormalities Noted: No No Abnormalities Noted: No Callus: No Atrophie Blanche: No Crepitus: No Cyanosis: No Excoriation: No Ecchymosis: No Fluctuance: No Erythema: No Friable: No Hemosiderin Staining:  No Induration: No Mottled: No Localized Edema: No Pallor: No Rash: No Rubor: No Scarring: No Temperature / Pain Moisture Temperature: No Abnormality No Abnormalities Noted: No Tenderness on Palpation: Yes Dry / Scaly: Yes Maceration: No Moist: Yes Wound Preparation Ulcer Cleansing: Rinsed/Irrigated with Saline Topical Anesthetic Applied: Other: lidocaine 4%, Treatment Notes Wound #3 (Left Calcaneus) 1. Cleansed with: Clean wound with Normal Saline 2. Anesthetic Topical Lidocaine 4% cream to wound bed prior to debridement 4. Dressing Applied: Santyl Ointment 5. Secondary Dressing Applied [place],  ABD and kerlix/Conform 7. Secured with Recruitment consultant) Signed: 07/30/2016 5:37:17 PM By: Regan Lemming BSN, RN Entered By: Regan Lemming on 07/30/2016 13:43:57 Eaves, [name] ([id number]) -------------------------------------------------------------------------------- Wound Assessment Details Patient Name: [name] Date of Service: 07/30/2016 1:30 PM Medical Record Number: [id number] Patient Account Number: [id number] Date of Birth/Sex: 1955/11/26 (60 y.o. Male) Treating RN: Baruch Gouty, RN, BSN, [place] Primary Care Physician: [name] Other Clinician: Referring Physician: [name] Treating Physician/Extender: [name] in Treatment: 16 Wound Status Wound Number: 4 Primary Etiology: Pressure Ulcer Wound Location: Right Calcaneus Wound Status: Open Wounding Event: Pressure Injury Comorbid History: Anemia Date Acquired: 02/07/2016 Weeks Of Treatment: 16 Clustered Wound: No Photos Photo Uploaded By: Regan Lemming on 07/30/2016 16:56:02 Wound Measurements Length: (cm) 1 Width: (cm) 2.5 Depth: (cm) 0.2 Area: (cm) 1.963 Volume: (cm) 0.393 % Reduction in Area: 84.4% % Reduction in Volume: 68.7% Epithelialization: Small (1-33%) Undermining: No Wound Description Classification: Category/Stage II Wound Margin: Flat and Intact Exudate  Amount: Large Exudate Type: Purulent Exudate Color: yellow, brown, [place] Foul Odor After Cleansing: Yes Due to Product Use: No Wound Bed Granulation Amount: Large (67-100%) Exposed Structure Granulation Quality: Red Fascia Exposed: No Necrotic Amount: Small (1-33%) Fat Layer Exposed: No Necrotic Quality: Eschar Tendon Exposed: No [place], [place] ([id number]) Muscle Exposed: No Joint Exposed: No Bone Exposed: No Limited to Skin Breakdown Periwound Skin Texture Texture Color No Abnormalities Noted: No No Abnormalities Noted: No Callus: No Atrophie Blanche: No Crepitus: No Cyanosis: No Excoriation: No Ecchymosis: No Fluctuance: No Erythema: No Friable: No Hemosiderin Staining: No Induration: No Mottled: No Localized Edema: No Pallor: No Rash: No Rubor: No Scarring: No Temperature / Pain Moisture Temperature: No Abnormality No Abnormalities Noted: No Tenderness on Palpation: Yes Dry / Scaly: Yes Maceration: No Moist: Yes Wound Preparation Ulcer Cleansing: Rinsed/Irrigated with Saline Topical Anesthetic Applied: Other: lidocaine 4%, Treatment Notes Wound #4 (Right Calcaneus) 1. Cleansed with: Clean wound with Normal Saline 2. Anesthetic Topical Lidocaine 4% cream to wound bed prior to debridement 4. Dressing Applied: Santyl Ointment 5. Secondary Dressing Applied [place], ABD and kerlix/Conform 7. Secured with Recruitment consultant) Signed: 07/30/2016 5:37:17 PM By: Regan Lemming BSN, RN Entered By: Regan Lemming on 07/30/2016 13:44:19 [name] ([id number]) -------------------------------------------------------------------------------- Vitals Details Patient Name: [name] Date of Service: 07/30/2016 1:30 PM Medical Record Number: [id number] Patient Account Number: [id number] Date of Birth/Sex: 12/12/1955 (60 y.o. Male) Treating RN: Baruch Gouty, RN, BSN, [place] Primary Care Physician: [name] Other Clinician: Referring Physician: [name] Treating Physician/Extender: [name] in Treatment: 16 Vital Signs Time Taken: 13:35 Temperature (F): 97.8 Height (in): 70 Pulse (bpm): 76 Respiratory Rate (breaths/min): 19 Blood Pressure (mmHg): 121/78 Reference Range: 80 - 120 mg / dl Electronic Signature(s) Signed: 07/30/2016 5:37:17 PM By: Regan Lemming BSN, RN Entered By: Regan Lemming on 07/30/2016 13:37:26

## 2016-08-02 NOTE — Progress Notes (Signed)
[name] ([id number]) Visit Report for 07/30/2016 Chief Complaint Document Details Patient Name: [name] Date of Service: 07/30/2016 1:30 PM Medical Record Patient Account Number: [id number] Number: Treating RN: Ahmed Prima 1956/04/25 (59 y.o. Other Clinician: Date of Birth/Sex: Male) Treating Jordana Dugue Primary Care Physician: [name] Physician/Extender: G Referring Physician: Claudie Revering in Treatment: 16 Information Obtained from: Patient Chief Complaint The patient is here for wounds on his bilateral feet supposedly obtained during a hospitalization from 3/24 through 4/6 Electronic Signature(s) Signed: 07/30/2016 4:24:47 PM By: Linton Ham MD Entered By: Linton Ham on 07/30/2016 13:50:37 [place], [name] ([id number]) -------------------------------------------------------------------------------- Debridement Details Patient Name: [name] Date of Service: 07/30/2016 1:30 PM Medical Record Patient Account Number: [id number] Number: Treating RN: Ahmed Prima [birth year] (59 y.o. Other Clinician: Date of Birth/Sex: Male) Treating Berdella Bacot, [place] Primary Care Physician: [name] Physician/Extender: G Referring Physician: Claudie Revering in Treatment: 16 Debridement Performed for Wound #3 Left Calcaneus Assessment: Performed By: Physician [name], MD Debridement: Debridement Pre-procedure Yes - 13:42 Verification/Time Out Taken: Start Time: 13:42 Pain Control: Lidocaine 4% Topical Solution Level: Skin/Subcutaneous Tissue Total Area Debrided (L x 1.7 (cm) x 5 (cm) = 8.5 (cm) W): Tissue and other Non-Viable, Fibrin/Slough, Subcutaneous material debrided: Instrument: Curette Bleeding: Minimum Hemostasis Achieved: Pressure End Time: 13:47 Procedural Pain: 0 Post Procedural Pain: 0 Response to Treatment: Procedure was tolerated well Post Debridement Measurements of Total  Wound Length: (cm) 1.7 Stage: Category/Stage II Width: (cm) 5 Depth: (cm) 0.2 Volume: (cm) 1.335 Character of Wound/Ulcer Post Requires Further Debridement: Debridement Severity of Tissue Post Fat layer exposed Debridement: Post Procedure Diagnosis Same as Pre-procedure Electronic Signature(s) Signed: 07/30/2016 4:24:47 PM By: Linton Ham MD J.F. Villareal, [name] ([id number]) Signed: 08/01/2016 5:24:41 PM By: Alric Quan Entered By: Linton Ham on 07/30/2016 13:50:14 [name] ([id number]) -------------------------------------------------------------------------------- Debridement Details Patient Name: [name] Date of Service: 07/30/2016 1:30 PM Medical Record Patient Account Number: [id number] Number: Treating RN: Ahmed Prima 1956-02-18 (59 y.o. Other Clinician: Date of Birth/Sex: Male) Treating Bristal Steffy, [place] Primary Care Physician: [name] Physician/Extender: G Referring Physician: Claudie Revering in Treatment: 16 Debridement Performed for Wound #4 Right Calcaneus Assessment: Performed By: Physician [name], MD Debridement: Debridement Pre-procedure Yes - 13:42 Verification/Time Out Taken: Start Time: 13:42 Pain Control: Lidocaine 4% Topical Solution Level: Skin/Subcutaneous Tissue Total Area Debrided (L x 1 (cm) x 2.5 (cm) = 2.5 (cm) W): Tissue and other Non-Viable, Fibrin/Slough, Subcutaneous material debrided: Instrument: Curette Bleeding: Minimum Hemostasis Achieved: Pressure End Time: 13:47 Procedural Pain: 0 Post Procedural Pain: 0 Response to Treatment: Procedure was tolerated well Post Debridement Measurements of Total Wound Length: (cm) 1 Stage: Category/Stage II Width: (cm) 1.2 Depth: (cm) 0.2 Volume: (cm) 0.188 Character of Wound/Ulcer Post Requires Further Debridement: Debridement Severity of Tissue Post Fat layer exposed Debridement: Post Procedure Diagnosis Same as  Pre-procedure Electronic Signature(s) Signed: 07/30/2016 4:24:47 PM By: Linton Ham MD [place], [name] ([id number]) Signed: 08/01/2016 5:24:41 PM By: Alric Quan Entered By: Linton Ham on 07/30/2016 13:50:27 [place], [name] ([id number]) -------------------------------------------------------------------------------- HPI Details Patient Name: [name] Date of Service: 07/30/2016 1:30 PM Medical Record Patient Account Number: [id number] Number: Treating RN: Ahmed Prima 1956/07/16 (59 y.o. Other Clinician: Date of Birth/Sex: Male) Treating Madiline Saffran Primary Care Physician: [name] Physician/Extender: G Referring Physician: Claudie Revering in Treatment: 16 History of Present Illness HPI Description: 04/08/16; this is a patient we really don't know too much about. He was hospitalized from 3/24 through 4/6 residing with  a sodium of 167 creatinine of 2.49 right lower lobe pneumonia sepsis/septic shock syndrome. He apparently came back to [facility] He apparently had been there only 2 weeks before he went out. I know very little about him premorbidly. The hospital he had a CT scan of the head that showed atrophy and chronic small vessel white matter ischemic changes and remote infarcts also noted within the bilateral basal ganglia. His echocardiogram and presentation showed a EF of 20-25%, left ventricle was moderately dilated with diffuse hypokinesis. Apparently he return to his assisted living with wounds on his bilateral feet. According to the attendant came with him he could walk before he went out he could no longer walk now. There are wounds on both heels covered with a black eschar and also wounds on his bilateral dorsal feet. ABIs calculated in our clinic for 0.49 bilaterally. He was a smoker up to 2 months ago he is not smoking now. 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 Monophasic waves distally bilaterally. He is going back at the end of the month I believe June 29 probably for an arteriogram as arranged by Dr. Delana Meyer in the meantime he has had no real changes 04/30/16; we are waiting vascular review on June 29. The black eschar over his bilateral heels is beginning to separate using Santyl. The areas on the dorsal aspect of his feet looked considerably better/resolved. He apparently has no complaints. He also has a severe presumably ischemic cardiomyopathy as noted above 05/21/16:pt returns today for ongoing evaluation of wounds. I reviewed recent venous and arterial diagnostic findings. He is scheduled for a LLE angio on 05/26/16 and RLE angio on 06/02/16 for advanced evaluation of PAD as noted per lower extremity arterial duplex. BLE venous duplex was negative for venous reflux, DVT or insufficiency. 06/10/16; this is a patient I have not seen in over a month although he was seen here on 7/12. When I admitted him to our clinic I felt he had pressure ulcerations but also potential for limb threatening ischemia. I referred him urgently to vascular surgery. Since we have last seen him in this clinic he has undergone 2 procedures. On 7/17 he will underwent percutaneous transluminal angioplasty of the left above-knee popliteal artery as well as the mid to distal SFA. On 7/27 he underwent for cutaneous transluminal angioplasty of the right popliteal artery and SFA an angioplasty of the proximal SFA. He had a stent placement in the right popliteal artery. The patient is difficult to gauge symptomatology although he does not currently complain of pain. Our intake nurse reports purulent drainage out of the left heel and an odor. 06/17/2016 -- right heel x-ray and left heel x-ray -- IMPRESSION:No radiographic  evidence of osteomyelitis. Soft tissue changes are Hughes, Larry ([id number]) consistent with cellulitis. He had a wound culture which is positive for abundant Citrobacter fundi and pseudomonas aeruginosa. Both are sensitive to ciprofloxacin and at the present time the patient is on doxycycline 100 mg, prescribed by Dr. Dellia Nims last week. I will prescribe ciprofloxacin for him today. 07/16/16 it is been quite a while since I've seen these wounds however both of them are quite a bit better. Using [place], being followed by [facility]. 07/30/16; I see this man every 2 weeks. He had ischemic  wounds on his bilateral heels. He has been using Santyl with [facility] coming out to his facility Electronic Signature(s) Signed: 07/30/2016 4:24:47 PM By: Linton Ham MD Entered By: Linton Ham on 07/30/2016 13:52:30 [name] ([id number]) -------------------------------------------------------------------------------- Physical Exam Details Patient Name: [name] Date of Service: 07/30/2016 1:30 PM Medical Record Patient Account Number: [id number] Number: Treating RN: Ahmed Prima 1956/08/08 (59 y.o. Other Clinician: Date of Birth/Sex: Male) Treating Natale Barba Primary Care Physician: [name] Physician/Extender: G Referring Physician: Claudie Revering in Treatment: 16 Constitutional Sitting or standing Blood Pressure is within target range for patient.. Pulse regular and within target range for patient.Marland Kitchen Respirations regular, non-labored and within target range.. Temperature is normal and within the target range for the patient.. Patient's appearance is neat and clean. Appears in no acute distress. Well nourished and well developed.. Notes Wound exam; both wounds are debrided of surface slough however the base post debridement appears to be cleaning up quite nicely and dimensions are reduced. No evidence of surrounding infection. The  wounds are making nice albeit gradual progress Electronic Signature(s) Signed: 07/30/2016 4:24:47 PM By: Linton Ham MD Entered By: Linton Ham on 07/30/2016 13:53:30 [name] ([id number]) -------------------------------------------------------------------------------- Physician Orders Details Patient Name: [name] Date of Service: 07/30/2016 1:30 PM Medical Record Patient Account Number: [id number] Number: Treating RN: Baruch Gouty, RN, BSN, Rita 1956/04/04 (59 y.o. Other Clinician: Date of Birth/Sex: Male) Treating Penne Rosenstock Primary Care Physician: [name] Physician/Extender: G Referring Physician: Claudie Revering in Treatment: 16 Verbal / Phone Orders: Yes Clinician: Afful, RN, BSN, Rita Read Back and Verified: Yes Diagnosis Coding Wound Cleansing Wound #3 Left Calcaneus o Clean wound with Normal Saline. o Cleanse wound with mild soap and water Wound #4 Right Calcaneus o Clean wound with Normal Saline. o Cleanse wound with mild soap and water Anesthetic Wound #3 Left Calcaneus o Topical Lidocaine 4% cream applied to wound bed prior to debridement - office use only Wound #4 Right Calcaneus o Topical Lidocaine 4% cream applied to wound bed prior to debridement - office use only Primary Wound Dressing Wound #3 Left Calcaneus o Santyl Ointment Wound #4 Right Calcaneus o Santyl Ointment Secondary Dressing Wound #3 Left Calcaneus o ABD pad - foam to top of foot for protection o Dry Gauze o Conform/Kerlix - tape, netting Wound #4 Right Calcaneus o ABD pad - foam to top of foot for protection o Dry Gauze o Conform/Kerlix - tape, netting Hughes, Larry ([id number]) Dressing Change Frequency Wound #3 Left Calcaneus o Change dressing every other day. Wound #4 Right Calcaneus o Change dressing every other day. Follow-up Appointments Wound #3 Left Calcaneus o Return Appointment in 2 weeks. Wound #4  Right Calcaneus o Return Appointment in 2 weeks. Off-Loading Wound #3 Left Calcaneus o Heel suspension boot to: - pt to wear sage boots, please float heels o Turn and reposition every 2 hours o Other: - Hoyer Lift Wound #4 Right Calcaneus o Heel suspension boot to: - pt to wear sage boots, please float heels o Turn and reposition every 2 hours o Other: - Advertising account planner Orders / Instructions Wound #3 Left Calcaneus o Increase protein intake. Wound #4 Right Calcaneus o Increase protein intake. Home Health Wound #3 Left Calcaneus o Continue Home Health Visits - [place] *****Please Order [place] for patient***** o Home Health Nurse may visit PRN to address patientos wound care needs. o FACE TO FACE ENCOUNTER: MEDICARE and MEDICAID PATIENTS: I certify that this patient  is under my care and that I had a face-to-face encounter that meets the physician face-to-face encounter requirements with this patient on this date. The encounter with the patient was in whole or in part for the following MEDICAL CONDITION: (primary reason for [place]) MEDICAL NECESSITY: I certify, that based on my findings, NURSING services are a medically necessary home health service. HOME BOUND STATUS: I certify that my clinical findings support that this patient is homebound (i.e., Due to illness or injury, pt requires aid of supportive devices such as crutches, cane, wheelchairs, walkers, the use of special transportation or the assistance of another person to leave their place of residence. There is a normal inability to leave the home and doing so requires considerable and taxing effort. Other absences are for medical reasons / religious services and are infrequent or of short duration when for other reasons). Hughes, Larry ([id number]) o If current dressing causes regression in wound condition, may D/C ordered dressing product/s and apply Normal Saline Moist  Dressing daily until next [place] / Other MD appointment. [place] of regression in wound condition at [phone]. o Please direct any NON-WOUND related issues/requests for orders to patient's Primary Care Physician Wound #4 Right Calcaneus o [place] Visits - [place] *****Please Order [place] for patient***** o Home Health Nurse may visit PRN to address patientos wound care needs. o FACE TO FACE ENCOUNTER: MEDICARE and MEDICAID PATIENTS: I certify that this patient is under my care and that I had a face-to-face encounter that meets the physician face-to-face encounter requirements with this patient on this date. The encounter with the patient was in whole or in part for the following MEDICAL CONDITION: (primary reason for [place]) MEDICAL NECESSITY: I certify, that based on my findings, NURSING services are a medically necessary home health service. HOME BOUND STATUS: I certify that my clinical findings support that this patient is homebound (i.e., Due to illness or injury, pt requires aid of supportive devices such as crutches, cane, wheelchairs, walkers, the use of special transportation or the assistance of another person to leave their place of residence. There is a normal inability to leave the home and doing so requires considerable and taxing effort. Other absences are for medical reasons / religious services and are infrequent or of short duration when for other reasons). o If current dressing causes regression in wound condition, may D/C ordered dressing product/s and apply Normal Saline Moist Dressing daily until next [place] / Other MD appointment. [place] of regression in wound condition at [phone]. o Please direct any NON-WOUND related issues/requests for orders to patient's Primary Care Physician Medications-please add to medication list. o Santyl Enzymatic  Ointment o Other: - Vitamin C, Zinc, Multivitamin Wound #4 Right Calcaneus o Santyl Enzymatic Ointment o Santyl Enzymatic Ointment o Other: - Vitamin C, Zinc, Multivitamin o Other: - Vitamin C, Zinc, Multivitamin Electronic Signature(s) Signed: 07/30/2016 4:24:47 PM By: Linton Ham MD Signed: 07/30/2016 5:37:17 PM By: Regan Lemming BSN, RN Entered By: Regan Lemming on 07/30/2016 13:49:56 [place], [name] ([id number]) -------------------------------------------------------------------------------- Problem List Details Patient Name: [name] Date of Service: 07/30/2016 1:30 PM Medical Record Patient Account Number: [id number] Number: Treating RN: Ahmed Prima Jul 12, 1956 (59 y.o. 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 Other Clinician: Date of Birth/Sex: Male) Treating Jerrian Mells Primary Care Physician: [name] Physician/Extender: G Referring Physician: Claudie Revering in Treatment: 16 Subjective Chief Complaint Information obtained from Patient The patient is here for wounds on his bilateral feet supposedly obtained during a hospitalization from 3/24 through 4/6 History of Present Illness (HPI) 04/08/16; this is a patient we really don't know too much about. He was hospitalized from 3/24 through 4/6 residing with a sodium of 167 creatinine of 2.49 right lower lobe pneumonia sepsis/septic shock syndrome. He apparently came back to [facility] He apparently had been there only 2 weeks before he went out. I know very little about him premorbidly. The hospital he had a CT scan of the head that showed atrophy and chronic small vessel white matter ischemic changes and remote infarcts also noted within the bilateral basal ganglia. His echocardiogram and presentation showed a EF of 20-25%, left ventricle was moderately dilated with diffuse hypokinesis. Apparently he return to his assisted living with wounds on his bilateral feet. According to the attendant came with him he could walk before he went out he could no longer walk now. There are wounds on both heels covered with a black eschar and also wounds on his bilateral dorsal feet. ABIs calculated in our clinic for 0.49 bilaterally. He was a smoker up to 2 months ago he is not smoking now. 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 Monophasic waves distally bilaterally. He is going back at the end of the month I believe June 29 probably for an arteriogram as arranged by Dr. Delana Meyer in the meantime he has had no real changes 04/30/16; we are waiting vascular review on June 29. The black eschar over his bilateral heels  is beginning to separate using Santyl. The areas on the dorsal aspect of his feet looked considerably better/resolved. He apparently has no complaints. He also has a severe presumably ischemic cardiomyopathy as noted above 05/21/16:pt returns today for ongoing evaluation of wounds. I reviewed recent venous and arterial diagnostic findings. He is scheduled for a LLE angio on 05/26/16 and RLE angio on 06/02/16 for advanced evaluation of PAD as noted per lower extremity arterial duplex. BLE venous duplex was negative for venous reflux, DVT or insufficiency. 06/10/16; this is a patient I have not seen in over a month although he was seen here on 7/12. When I admitted him to our clinic I felt he had pressure ulcerations but also potential for limb threatening ischemia. I referred him urgently to vascular surgery. Since we have last seen him in this clinic he has undergone 2 procedures. On 7/17 he will underwent percutaneous transluminal angioplasty of the left above-knee popliteal artery as well as the  mid to distal SFA. On 7/27 he underwent for cutaneous transluminal Hughes, Larry ([id number]) angioplasty of the right popliteal artery and SFA an angioplasty of the proximal SFA. He had a stent placement in the right popliteal artery. The patient is difficult to gauge symptomatology although he does not currently complain of pain. Our intake nurse reports purulent drainage out of the left heel and an odor. 06/17/2016 -- right heel x-ray and left heel x-ray -- IMPRESSION:No radiographic evidence of osteomyelitis. Soft tissue changes are consistent with cellulitis. He had a wound culture which is positive for abundant Citrobacter fundi and pseudomonas aeruginosa. Both are sensitive to ciprofloxacin and at the present time the patient is on doxycycline 100 mg, prescribed by Dr. Dellia Nims last week. I will prescribe ciprofloxacin for him today. 07/16/16 it is been quite a while since I've seen these wounds however  both of them are quite a bit better. Using [place], being followed by [facility]. 07/30/16; I see this man every 2 weeks. He had ischemic wounds on his bilateral heels. He has been using Santyl with [facility] coming out to his facility Objective Constitutional Sitting or standing Blood Pressure is within target range for patient.. Pulse regular and within target range for patient.Marland Kitchen Respirations regular, non-labored and within target range.. Temperature is normal and within the target range for the patient.. Patient's appearance is neat and clean. Appears in no acute distress. Well nourished and well developed.. Vitals Time Taken: 1:35 PM, Height: 70 in, Temperature: 97.8 F, Pulse: 76 bpm, Respiratory Rate: 19 breaths/min, Blood Pressure: 121/78 mmHg. General Notes: Wound exam; both wounds are debrided of surface slough however the base post debridement appears to be cleaning up quite nicely and dimensions are reduced. No evidence of surrounding infection. The wounds are making nice albeit gradual progress Integumentary (Hair, Skin) Wound #3 status is Open. Original cause of wound was Pressure Injury. The wound is located on the Left Calcaneus. The wound measures 1.7cm length x 5cm width x 0.2cm depth; 6.676cm^2 area and 1.335cm^3 volume. The wound is limited to skin breakdown. There is no tunneling or undermining noted. There is a large amount of purulent drainage noted. The wound margin is flat and intact. There is large (67- 100%) red granulation within the wound bed. There is a small (1-33%) amount of necrotic tissue within the wound bed including Eschar and Adherent Slough. The periwound skin appearance exhibited: Dry/Scaly, Moist. The periwound skin appearance did not exhibit: Callus, Crepitus, Excoriation, Fluctuance, Friable, Induration, Localized Edema, Rash, Scarring, Maceration, Atrophie Blanche, Cyanosis, Ecchymosis, Hemosiderin Staining, Mottled, Pallor, Rubor,  Erythema. Periwound temperature was noted as No Abnormality. The periwound has tenderness on palpation. Hughes, Larry ([id number]) Wound #4 status is Open. Original cause of wound was Pressure Injury. The wound is located on the Right Calcaneus. The wound measures 1cm length x 2.5cm width x 0.2cm depth; 1.963cm^2 area and 0.393cm^3 volume. The wound is limited to skin breakdown. There is no undermining noted. There is a large amount of purulent drainage noted. The wound margin is flat and intact. There is large (67-100%) red granulation within the wound bed. There is a small (1-33%) amount of necrotic tissue within the wound bed including Eschar. The periwound skin appearance exhibited: Dry/Scaly, Moist. The periwound skin appearance did not exhibit: Callus, Crepitus, Excoriation, Fluctuance, Friable, Induration, Localized Edema, Rash, Scarring, Maceration, Atrophie Blanche, Cyanosis, Ecchymosis, Hemosiderin Staining, Mottled, Pallor, Rubor, Erythema. Periwound temperature was noted as No Abnormality. The periwound has tenderness on palpation. 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 There was a Skin/Subcutaneous Tissue Debridement [id number] debridement with total area of 8.5 sq cm performed by [name], MD. with the following instrument(s): Curette to remove Non-Viable tissue/material including Fibrin/Slough and Subcutaneous after achieving pain control using Lidocaine 4% Topical Solution. A time out was conducted at 13:42, prior to the start of the procedure. A Minimum amount of bleeding was controlled with Pressure.  The procedure was tolerated well with a pain level of 0 throughout and a pain level of 0 following the procedure. Post Debridement Measurements: 1.7cm length x 5cm width x 0.2cm depth; 1.335cm^3 volume. Post debridement Stage noted as Category/Stage II. Character of Wound/Ulcer Post Debridement requires further debridement. Severity of Tissue Post Debridement is: Fat layer exposed. Post procedure Diagnosis Wound #3: Same as Pre-Procedure Wound #4 Wound #4 is a Pressure Ulcer located on the Right Calcaneus . There was a Skin/Subcutaneous Tissue Debridement [id number]) debridement with total area of 2.5 sq cm performed by [name], MD. with the following instrument(s): Curette to remove Non-Viable tissue/material including Fibrin/Slough and Subcutaneous after achieving pain control using Lidocaine 4% Topical Solution. A time Hughes, Larry ([id number]) out was conducted at 13:42, prior to the start of the procedure. A Minimum amount of bleeding was controlled with Pressure. The procedure was tolerated well with a pain level of 0 throughout and a pain level of 0 following the procedure. Post Debridement Measurements: 1cm length x 1.2cm width x 0.2cm depth; 0.188cm^3 volume. Post debridement Stage noted as Category/Stage II. Character of Wound/Ulcer Post Debridement requires further debridement. Severity of Tissue Post Debridement is: Fat layer exposed. Post procedure Diagnosis Wound #4: Same as Pre-Procedure Plan Wound Cleansing: Wound #3 Left Calcaneus: Clean wound with Normal Saline. Cleanse wound with mild soap and water Wound #4 Right Calcaneus: Clean wound with Normal Saline. Cleanse wound with mild soap and water Anesthetic: Wound #3 Left Calcaneus: Topical Lidocaine 4% cream applied to wound bed prior to debridement - office use only Wound #4 Right Calcaneus: Topical Lidocaine 4% cream applied to wound bed prior to debridement - office use only Primary Wound  Dressing: Wound #3 Left Calcaneus: Santyl Ointment Wound #4 Right Calcaneus: Santyl Ointment Secondary Dressing: Wound #3 Left Calcaneus: ABD pad - foam to top of foot for protection Dry Gauze Conform/Kerlix - tape, netting Wound #4 Right Calcaneus: ABD pad - foam to top of foot for protection Dry Gauze Conform/Kerlix - tape, netting Dressing Change Frequency: Wound #3 Left Calcaneus: Change dressing every other day. Wound #4 Right Calcaneus: Change dressing every other day. Follow-up Appointments: Wound #3 Left Calcaneus: Return Appointment in 2 weeks. Wound #4 Right Calcaneus: Hughes, Larry ([id number]) Return Appointment in 2 weeks. Off-Loading: Wound #3 Left Calcaneus: Heel suspension boot to: - pt to wear sage boots, please float heels Turn and reposition every 2 hours Other: - Hoyer Lift Wound #4 Right Calcaneus: Heel suspension boot to: - pt to wear sage boots, please float heels Turn and reposition every 2 hours Other: - Advertising account planner Orders / Instructions: Wound #3 Left Calcaneus: Increase protein intake. Wound #4 Right Calcaneus: Increase protein intake. Home Health: Wound #3 Left  Calcaneus: Continue Home Health Visits - [place] *****Please Order [place] for patient***** Home Health Nurse may visit PRN to address patient s wound care needs. FACE TO FACE ENCOUNTER: MEDICARE and MEDICAID PATIENTS: I certify that this patient is under my care and that I had a face-to-face encounter that meets the physician face-to-face encounter requirements with this patient on this date. The encounter with the patient was in whole or in part for the following MEDICAL CONDITION: (primary reason for [place]) MEDICAL NECESSITY: I certify, that based on my findings, NURSING services are a medically necessary home health service. HOME BOUND STATUS: I certify that my clinical findings support that this patient is homebound (i.e., Due to illness or  injury, pt requires aid of supportive devices such as crutches, cane, wheelchairs, walkers, the use of special transportation or the assistance of another person to leave their place of residence. There is a normal inability to leave the home and doing so requires considerable and taxing effort. Other absences are for medical reasons / religious services and are infrequent or of short duration when for other reasons). If current dressing causes regression in wound condition, may D/C ordered dressing product/s and apply Normal Saline Moist Dressing daily until next [place] / Other MD appointment. [place] of regression in wound condition at [phone]. Please direct any NON-WOUND related issues/requests for orders to patient's Primary Care Physician Wound #4 Right Calcaneus: [place] Visits - [place] *****Please Order [place] for patient***** Home Health Nurse may visit PRN to address patient s wound care needs. FACE TO FACE ENCOUNTER: MEDICARE and MEDICAID PATIENTS: I certify that this patient is under my care and that I had a face-to-face encounter that meets the physician face-to-face encounter requirements with this patient on this date. The encounter with the patient was in whole or in part for the following MEDICAL CONDITION: (primary reason for [place]) MEDICAL NECESSITY: I certify, that based on my findings, NURSING services are a medically necessary home health service. HOME BOUND STATUS: I certify that my clinical findings support that this patient is homebound (i.e., Due to illness or injury, pt requires aid of supportive devices such as crutches, cane, wheelchairs, walkers, the use of special transportation or the assistance of another person to leave their place of residence. There is a normal inability to leave the home and doing so requires considerable and taxing effort. Other absences are for medical reasons /  religious services and are infrequent or of short duration when for other reasons). If current dressing causes regression in wound condition, may D/C ordered dressing product/s and apply Normal Saline Moist Dressing daily until next [place] / Other MD appointment. [place] of regression in wound condition at [phone]. Please direct any NON-WOUND related issues/requests for orders to patient's Primary Care Physician Medications-please add to medication list.: [name], [name] ([id number]) Santyl Enzymatic Ointment Other: - Vitamin C, Zinc, Multivitamin Wound #4 Right Calcaneus: Santyl Enzymatic Ointment Santyl Enzymatic Ointment Other: - Vitamin C, Zinc, Multivitamin Other: - Vitamin C, Zinc, Multivitamin No need to change current dressings which are Santyl based and being changed by home health [[facility]] Electronic Signature(s) Signed: 07/30/2016 4:24:47 PM By: Linton Ham MD Entered By: Linton Ham on 07/30/2016 13:53:53 Hughes, Larry ([id number]) -------------------------------------------------------------------------------- SuperBill Details Patient Name: [name] Date of Service: 07/30/2016 Medical Record Patient Account Number: [id number] Number: Treating RN: Ahmed Prima May 15, 1956 (59 y.o. 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 I70.245 Atherosclerosis of native arteries of left leg with ulceration of other part of foot Facility Procedures CPT4 Code: [id number] Description: [id number] - DEB SUBQ TISSUE 20 SQ CM/< ICD-10  Description Diagnosis L89.610 Pressure ulcer of right heel, unstageable L89.620 Pressure ulcer of left heel, unstageable Modifier: Quantity: 1 Physician Procedures CPT4 Code: [id number] Description: [id number] - WC PHYS SUBQ TISS 20 SQ CM ICD-10 Description Diagnosis L89.610 Pressure ulcer of right heel, unstageable L89.620 Pressure ulcer of left heel, unstageable Modifier: Quantity: 1 Electronic Signature(s) Signed: 07/30/2016 4:24:47 PM By: Linton Ham MD Entered By: Linton Ham on 07/30/2016 13:54:16

## 2016-08-12 ENCOUNTER — Encounter: Payer: Medicare Other | Attending: Physician Assistant | Admitting: Physician Assistant

## 2016-08-12 DIAGNOSIS — I70234 Atherosclerosis of native arteries of right leg with ulceration of heel and midfoot: Secondary | ICD-10-CM | POA: Diagnosis not present

## 2016-08-12 DIAGNOSIS — Z87891 Personal history of nicotine dependence: Secondary | ICD-10-CM | POA: Insufficient documentation

## 2016-08-12 DIAGNOSIS — I70245 Atherosclerosis of native arteries of left leg with ulceration of other part of foot: Secondary | ICD-10-CM | POA: Diagnosis not present

## 2016-08-12 DIAGNOSIS — Z8673 Personal history of transient ischemic attack (TIA), and cerebral infarction without residual deficits: Secondary | ICD-10-CM | POA: Insufficient documentation

## 2016-08-12 DIAGNOSIS — L89610 Pressure ulcer of right heel, unstageable: Secondary | ICD-10-CM | POA: Insufficient documentation

## 2016-08-12 DIAGNOSIS — L89620 Pressure ulcer of left heel, unstageable: Secondary | ICD-10-CM | POA: Insufficient documentation

## 2016-08-14 NOTE — Progress Notes (Signed)
ARBEN, KARAGIANNIS ([id number]) Visit Report for 08/12/2016 Arrival Information Details Patient Name: [name] Date of Service: 08/12/2016 10:00 AM Medical Record Number: [id number] Patient Account Number: [id number] Date of Birth/Sex: [birth year] (60 y.o. Male) Treating RN: [name] Primary Care Physician: [name] Other Clinician: Referring Physician: [name] Treating Physician/Extender: [name] in Treatment: 18 Visit Information History Since Last Visit Added or deleted any medications: No Patient Arrived: Wheel Chair Any new allergies or adverse reactions: No Arrival Time: 10:15 Had a fall or experienced change in No activities of daily living that may affect Accompanied By: staff risk of falls: Transfer Assistance: Manual Signs or symptoms of abuse/neglect since last No Patient Identification Verified: Yes visito Secondary Verification Process Yes Hospitalized since last visit: No Completed: Pain Present Now: No Patient Requires Transmission-Based No Precautions: Patient Has Alerts: No Electronic Signature(s) Signed: 08/12/2016 5:47:08 PM By: [name] Entered By: [name] on 08/12/2016 10:15:35 [name] ([id number]) -------------------------------------------------------------------------------- Encounter Discharge Information Details Patient Name: [name] Date of Service: 08/12/2016 10:00 AM Medical Record Number: [id number] Patient Account Number: [id number] Date of Birth/Sex: 10/04/1956 (60 y.o. Male) Treating RN: [name] Primary Care Physician: [name] Other Clinician: Referring Physician: [name] Treating Physician/Extender: [name] Weeks in Treatment: 18 Encounter Discharge Information Items Discharge Pain Level: 0 Discharge Condition: Stable Ambulatory Status: Wheelchair Discharge Destination: Home Transportation: Private Auto Accompanied By: staff Schedule Follow-up  Appointment: Yes Medication Reconciliation completed and provided to Patient/Care No [name]: Provided on Clinical Summary of Care: 08/12/2016 Form Type Recipient Paper Patient DG Electronic Signature(s) Signed: 08/12/2016 11:05:59 AM By: [name] Entered By: [name] on 08/12/2016 11:05:59 [name] ([id number]) -------------------------------------------------------------------------------- Lower Extremity Assessment Details Patient Name: [name] Date of Service: 08/12/2016 10:00 AM Medical Record Number: [id number] Patient Account Number: [id number] Date of Birth/Sex: [birth year] (60 y.o. Male) Treating RN: [name] Primary Care Physician: [name] Other Clinician: Referring Physician: [name] Treating Physician/Extender: [name] in Treatment: 18 Vascular Assessment Pulses: Posterior Tibial Dorsalis Pedis Palpable: [Left:Yes] [Right:Yes] Extremity colors, hair growth, and conditions: Extremity Color: [Left:Normal] [Right:Normal] Hair Growth on Extremity: [Left:No] [Right:No] Temperature of Extremity: [Left:Warm] [Right:Warm] Capillary Refill: [Left:< 3 seconds] [Right:< 3 seconds] Electronic Signature(s) Signed: 08/12/2016 5:47:08 PM By: [name] Entered By: [name] on 08/12/2016 10:22:59 [name] ([id number]) -------------------------------------------------------------------------------- Multi Wound Chart Details Patient Name: [name] Date of Service: 08/12/2016 10:00 AM Medical Record Number: [id number] Patient Account Number: [id number] Date of Birth/Sex: 10-03-1956 (60 y.o. Male) Treating RN: [name] Primary Care Physician: [name] Other Clinician: Referring Physician: [name] Treating Physician/Extender: [name] Weeks in Treatment: 18 Vital Signs Height(in): 70 Pulse(bpm): 76 Weight(lbs): Blood Pressure 108/68 (mmHg): Body Mass  Index(BMI): Temperature(F): 98.2 Respiratory Rate 18 (breaths/min): Photos: [N/A:N/A] Wound Location: Left Calcaneus Right Calcaneus N/A Wounding Event: Pressure Injury Pressure Injury N/A Primary Etiology: Pressure Ulcer Pressure Ulcer N/A Comorbid History: Anemia Anemia N/A Date Acquired: 02/07/2016 02/07/2016 N/A Weeks of Treatment: 18 18 N/A Wound Status: Open Open N/A Measurements L x W x D 1.1x4.6x0.2 0.7x1.7x0.2 N/A (cm) Area (cm) : 3.974 0.935 N/A Volume (cm) : 0.795 0.187 N/A % Reduction in Area: 81.40% 92.60% N/A % Reduction in Volume: 62.80% 85.10% N/A Classification: Category/Stage II Category/Stage II N/A Exudate Amount: Large Large N/A Exudate Type: Purulent Purulent N/A Exudate Color: yellow, brown, Dobbin yellow, brown, Bell N/A Foul Odor After Yes Yes N/A Cleansing: Odor Anticipated Due to No No N/A Product Use: Wound Margin: Flat and Intact  Flat and Intact N/A Granulation Amount: Large (67-100%) Large (67-100%) N/A [name] ([id number]) Granulation Quality: Red Red N/A Necrotic Amount: Small (1-33%) Small (1-33%) N/A Necrotic Tissue: Eschar, Adherent Slough Eschar N/A Exposed Structures: Fascia: No Fascia: No N/A Fat: No Fat: No Tendon: No Tendon: No Muscle: No Muscle: No Joint: No Joint: No Bone: No Bone: No Limited to Skin Limited to Skin Breakdown Breakdown Epithelialization: Small (1-33%) Small (1-33%) N/A Periwound Skin Texture: Edema: No Edema: No N/A Excoriation: No Excoriation: No Induration: No Induration: No Callus: No Callus: No Crepitus: No Crepitus: No Fluctuance: No Fluctuance: No Friable: No Friable: No Rash: No Rash: No Scarring: No Scarring: No Periwound Skin Moist: Yes Moist: Yes N/A Moisture: Dry/Scaly: Yes Dry/Scaly: Yes Maceration: No Maceration: No Periwound Skin Color: Atrophie Blanche: No Atrophie Blanche: No N/A Cyanosis: No Cyanosis: No Ecchymosis: No Ecchymosis: No Erythema:  No Erythema: No Hemosiderin Staining: No Hemosiderin Staining: No Mottled: No Mottled: No Pallor: No Pallor: No Rubor: No Rubor: No Temperature: No Abnormality No Abnormality N/A Tenderness on Yes Yes N/A Palpation: Wound Preparation: Ulcer Cleansing: Ulcer Cleansing: N/A Rinsed/Irrigated with Rinsed/Irrigated with Saline Saline Topical Anesthetic Topical Anesthetic Applied: Other: lidocaine Applied: Other: lidocaine 4% 4% Treatment Notes Electronic Signature(s) Signed: 08/12/2016 5:47:08 PM By: [name] Entered By: [name] on 08/12/2016 10:40:16 Gwyn, Hridhaan ([id number]) [place], [place] ([id number]) -------------------------------------------------------------------------------- Multi-Disciplinary Care Plan Details Patient Name: [name] Date of Service: 08/12/2016 10:00 AM Medical Record Number: [id number] Patient Account Number: [id number] Date of Birth/Sex: 01-09-1956 (60 y.o. Male) Treating RN: [name] Primary Care Physician: [name] Other Clinician: Referring Physician: [name] Treating Physician/Extender: [name] Weeks in Treatment: 18 Active Inactive Abuse / Safety / Falls / Self Care Management Nursing Diagnoses: Potential for falls Goals: Patient will remain injury free Date Initiated: 04/08/2016 Goal Status: Active Interventions: Assess fall risk on admission and as needed Notes: Nutrition Nursing Diagnoses: Imbalanced nutrition Goals: Patient/caregiver agrees to and verbalizes understanding of need to use nutritional supplements and/or vitamins as prescribed Date Initiated: 04/08/2016 Goal Status: Active Interventions: Assess patient nutrition upon admission and as needed per policy Notes: Orientation to the Wound Care Program Nursing Diagnoses: Knowledge deficit related to the wound healing center program Goals: Patient/caregiver will verbalize understanding of the [place] ([id number]) Date Initiated: 04/08/2016 Goal Status: Active Interventions: Provide education on orientation to the wound center Notes: Pain, Acute or Chronic Nursing Diagnoses: Pain, acute or chronic: actual or potential Potential alteration in comfort, pain Goals: Patient will verbalize adequate pain control and receive pain control interventions during procedures as needed Date Initiated: 04/08/2016 Goal Status: Active Interventions: Assess comfort goal upon admission Complete pain assessment as per visit requirements Notes: Pressure Nursing Diagnoses: Knowledge deficit related to causes and risk factors for pressure ulcer development Knowledge deficit related to management of pressures ulcers Goals: Patient will remain free from development of additional pressure ulcers Date Initiated: 04/08/2016 Goal Status: Active Interventions: Assess offloading mechanisms upon admission and as needed Assess potential for pressure ulcer upon admission and as needed Notes: Wound/Skin Impairment Nursing Diagnoses: Impaired tissue integrity Pardo, Yeriel ([id number]) Goals: Ulcer/skin breakdown will have a volume reduction of 30% by week 4 Date Initiated: 04/08/2016 Goal Status: Active Ulcer/skin breakdown will have a volume reduction of 50% by week 8 Date Initiated: 04/08/2016 Goal Status: Active Ulcer/skin breakdown will have a volume reduction of 80% by week 12 Date Initiated: 04/08/2016 Goal Status: Active Interventions: Assess ulceration(s) every visit Notes: Electronic Signature(s) Signed:  08/12/2016 5:47:08 PM By: [name] Entered By: [name] on 08/12/2016 10:27:47 Stofko, Lorenso Hughes ([id number]) -------------------------------------------------------------------------------- Pain Assessment Details Patient Name: [name] Date of Service: 08/12/2016 10:00 AM Medical Record Number: [id number] Patient Account Number: [id number] Date of  Birth/Sex: [birth year] (60 y.o. Male) Treating RN: [name] Primary Care Physician: [name] Other Clinician: Referring Physician: [name] Treating Physician/Extender: [name] in Treatment: 18 Active Problems Location of Pain Severity and Description of Pain Patient Has Paino No Site Locations Pain Management and Medication Current Pain Management: Notes Topical or injectable lidocaine is offered to patient for acute pain when surgical debridement is performed. If needed, Patient is instructed to use over the counter pain medication for the following 24-48 hours after debridement. Wound care MDs do not prescribed pain medications. Patient has chronic pain or uncontrolled pain. Patient has been instructed to make an appointment with their Primary Care Physician for pain management. Electronic Signature(s) Signed: 08/12/2016 5:47:08 PM By: [name] Entered By: [name] on 08/12/2016 10:17:25 [name] ([id number]) -------------------------------------------------------------------------------- Patient/Caregiver Education Details Patient Name: [name] Date of Service: 08/12/2016 10:00 AM Medical Record Number: [id number] Patient Account Number: [id number] Date of Birth/Gender: 18-Sep-1956 (60 y.o. Male) Treating RN: [name] Primary Care Physician: [name] Other Clinician: Referring Physician: [name] Treating Physician/Extender: Sharalyn Ink in Treatment: 18 Education Assessment Education Provided To: Patient and Caregiver Education Topics Provided Wound/Skin Impairment: Handouts: Other: keep pressure off top of feet Methods: Explain/Verbal Responses: State content correctly Electronic Signature(s) Signed: 08/12/2016 5:47:08 PM By: [name] Entered By: [name] on 08/12/2016 11:04:45 Runquist, Maximillian  ([id number]) -------------------------------------------------------------------------------- Wound Assessment Details Patient Name: [name] Date of Service: 08/12/2016 10:00 AM Medical Record Number: [id number] Patient Account Number: [id number] Date of Birth/Sex: [birth year] (60 y.o. Male) Treating RN: [name] Primary Care Physician: [name] Other Clinician: Referring Physician: [name] Treating Physician/Extender: [name] in Treatment: 18 Wound Status Wound Number: 3 Primary Etiology: Pressure Ulcer Wound Location: Left Calcaneus Wound Status: Open Wounding Event: Pressure Injury Comorbid History: Anemia Date Acquired: 02/07/2016 Weeks Of Treatment: 18 Clustered Wound: No Photos Wound Measurements Length: (cm) 1.1 Width: (cm) 4.6 Depth: (cm) 0.2 Area: (cm) 3.974 Volume: (cm) 0.795 % Reduction in Area: 81.4% % Reduction in Volume: 62.8% Epithelialization: Small (1-33%) Tunneling: No Undermining: No Wound Description Classification: Category/Stage II Wound Margin: Flat and Intact Exudate Amount: Large Exudate Type: Purulent Exudate Color: yellow, brown, [place] Foul Odor After Cleansing: Yes Due to Product Use: No Wound Bed Granulation Amount: Large (67-100%) Exposed Structure Granulation Quality: Red Fascia Exposed: No Necrotic Amount: Small (1-33%) Fat Layer Exposed: No Necrotic Quality: Eschar, Adherent Slough Tendon Exposed: No Muscle Exposed: No Everton, Kole ([id number]) Joint Exposed: No Bone Exposed: No Limited to Skin Breakdown Periwound Skin Texture Texture Color No Abnormalities Noted: No No Abnormalities Noted: No Callus: No Atrophie Blanche: No Crepitus: No Cyanosis: No Excoriation: No Ecchymosis: No Fluctuance: No Erythema: No Friable: No Hemosiderin Staining: No Induration: No Mottled: No Localized Edema: No Pallor: No Rash: No Rubor: No Scarring: No Temperature / Pain Moisture  Temperature: No Abnormality No Abnormalities Noted: No Tenderness on Palpation: Yes Dry / Scaly: Yes Maceration: No Moist: Yes Wound Preparation Ulcer Cleansing: Rinsed/Irrigated with Saline Topical Anesthetic Applied: Other: lidocaine 4%, Treatment Notes Wound #3 (Left Calcaneus) 1. Cleansed with: Clean wound with Normal Saline 2. Anesthetic Topical Lidocaine 4% cream to wound bed prior to debridement 4. Dressing Applied: Santyl Ointment 5. Secondary Dressing Applied [place],  ABD and kerlix/Conform 7. Secured with Recruitment consultant) Signed: 08/12/2016 5:47:08 PM By: [name] Entered By: [name] on 08/12/2016 10:26:56 Stimson, Lorenso Hughes ([id number]) -------------------------------------------------------------------------------- Wound Assessment Details Patient Name: [name] Date of Service: 08/12/2016 10:00 AM Medical Record Number: [id number] Patient Account Number: [id number] Date of Birth/Sex: 1956-04-05 (60 y.o. Male) Treating RN: [name] Primary Care Physician: [name] Other Clinician: Referring Physician: [name] Treating Physician/Extender: [name] in Treatment: 18 Wound Status Wound Number: 4 Primary Etiology: Pressure Ulcer Wound Location: Right Calcaneus Wound Status: Open Wounding Event: Pressure Injury Comorbid History: Anemia Date Acquired: 02/07/2016 Weeks Of Treatment: 18 Clustered Wound: No Photos Wound Measurements Length: (cm) 0.7 Width: (cm) 1.7 Depth: (cm) 0.2 Area: (cm) 0.935 Volume: (cm) 0.187 % Reduction in Area: 92.6% % Reduction in Volume: 85.1% Epithelialization: Small (1-33%) Tunneling: No Undermining: No Wound Description Classification: Category/Stage II Wound Margin: Flat and Intact Exudate Amount: Large Exudate Type: Purulent Exudate Color: yellow, brown, Yarde Foul Odor After Cleansing: Yes Due to Product Use: No Wound Bed Granulation Amount: Large (67-100%)  Exposed Structure Granulation Quality: Red Fascia Exposed: No Necrotic Amount: Small (1-33%) Fat Layer Exposed: No Necrotic Quality: Eschar Tendon Exposed: No Muscle Exposed: No Mandley, Travin ([id number]) Joint Exposed: No Bone Exposed: No Limited to Skin Breakdown Periwound Skin Texture Texture Color No Abnormalities Noted: No No Abnormalities Noted: No Callus: No Atrophie Blanche: No Crepitus: No Cyanosis: No Excoriation: No Ecchymosis: No Fluctuance: No Erythema: No Friable: No Hemosiderin Staining: No Induration: No Mottled: No Localized Edema: No Pallor: No Rash: No Rubor: No Scarring: No Temperature / Pain Moisture Temperature: No Abnormality No Abnormalities Noted: No Tenderness on Palpation: Yes Dry / Scaly: Yes Maceration: No Moist: Yes Wound Preparation Ulcer Cleansing: Rinsed/Irrigated with Saline Topical Anesthetic Applied: Other: lidocaine 4%, Treatment Notes Wound #4 (Right Calcaneus) 1. Cleansed with: Clean wound with Normal Saline 2. Anesthetic Topical Lidocaine 4% cream to wound bed prior to debridement 4. Dressing Applied: Santyl Ointment 5. Secondary Dressing Applied [place], ABD and kerlix/Conform 7. Secured with Recruitment consultant) Signed: 08/12/2016 5:47:08 PM By: [name] Entered By: [name] on 08/12/2016 10:27:21 [name] ([id number]) -------------------------------------------------------------------------------- Vitals Details Patient Name: [name] Date of Service: 08/12/2016 10:00 AM Medical Record Number: [id number] Patient Account Number: [id number] Date of Birth/Sex: [birth year] (60 y.o. Male) Treating RN: [name] Primary Care Physician: [name] Other Clinician: Referring Physician: [name] Treating Physician/Extender: [name] in Treatment: 18 Vital Signs Time Taken: 10:17 Temperature (F): 98.2 Height (in): 70 Pulse (bpm): 76 Respiratory Rate  (breaths/min): 18 Blood Pressure (mmHg): 108/68 Reference Range: 80 - 120 mg / dl Electronic Signature(s) Signed: 08/12/2016 5:47:08 PM By: [name] Entered By: [name] on 08/12/2016 10:18:05

## 2016-08-14 NOTE — Progress Notes (Addendum)
[name], [name] ([id number]) Visit Report for 08/12/2016 Chief Complaint Document Details Patient Name: [name], [name] Date of Service: 08/12/2016 10:00 AM Medical Record Number: [id number] Patient Account Number: [id number] Date of Birth/Sex: 03-Jun-1956 (60 y.o. Male) Treating RN: [name] Primary Care Physician: [name] Other Clinician: Referring Physician: [name] Treating Physician/Extender: Melburn Hake, HOYT Weeks in Treatment: 18 Information Obtained from: Patient Chief Complaint The patient is here for wounds on his bilateral feet supposedly obtained during a hospitalization from 3/24 through 4/6 Electronic Signature(s) Signed: 08/14/2016 1:37:32 AM By: Worthy Keeler PA-C Entered By: Worthy Keeler on 08/12/2016 11:21:15 [name], [name] ([id number]) -------------------------------------------------------------------------------- Debridement Details Patient Name: [name] Date of Service: 08/12/2016 10:00 AM Medical Record Number: [id number] Patient Account Number: [id number] Date of Birth/Sex: 1956-11-10 (60 y.o. Male) Treating RN: [name] Primary Care Physician: [name] Other Clinician: Referring Physician: [name] Treating Physician/Extender: STONE III, HOYT Weeks in Treatment: 18 Debridement Performed for Wound #4 Right Calcaneus Assessment: Performed By: Physician STONE III, HOYT, Debridement: Debridement Pre-procedure Yes - 10:42 Verification/Time Out Taken: Start Time: 10:42 Pain Control: Lidocaine 4% Topical Solution Level: Skin/Subcutaneous Tissue Total Area Debrided (L x 0.7 (cm) x 1.7 (cm) = 1.19 (cm) W): Tissue and other Non-Viable, Callus, Exudate, Fibrin/Slough, Subcutaneous material debrided: Instrument: Curette Bleeding: Minimum Hemostasis Achieved: Pressure End Time: 10:46 Procedural Pain: 0 Post Procedural Pain: 0 Response to Treatment: Procedure was tolerated well Post Debridement Measurements of  Total Wound Length: (cm) 0.7 Stage: Category/Stage II Width: (cm) 1.7 Depth: (cm) 0.2 Volume: (cm) 0.187 Character of Wound/Ulcer Post Improved Debridement: Severity of Tissue Post Fat layer exposed Debridement: Post Procedure Diagnosis Same as Pre-procedure Electronic Signature(s) Signed: 08/12/2016 5:47:08 PM By: [name] Signed: 08/14/2016 1:37:32 AM By: Worthy Keeler PA-C Entered By: [name] on 08/12/2016 10:48:52 [name], [name] ([id number]) [place], [name] ([id number]) -------------------------------------------------------------------------------- Debridement Details Patient Name: [name] Date of Service: 08/12/2016 10:00 AM Medical Record Number: [id number] Patient Account Number: [id number] Date of Birth/Sex: 12/10/1955 (60 y.o. Male) Treating RN: [name] Primary Care Physician: [name] Other Clinician: Referring Physician: [name] Treating Physician/Extender: STONE III, HOYT Weeks in Treatment: 18 Debridement Performed for Wound #3 Left Calcaneus Assessment: Performed By: Physician STONE III, HOYT, Debridement: Debridement Pre-procedure Yes - 10:46 Verification/Time Out Taken: Start Time: 10:46 Pain Control: Lidocaine 4% Topical Solution Level: Skin/Subcutaneous Tissue Total Area Debrided (L x 1.1 (cm) x 4.6 (cm) = 5.06 (cm) W): Tissue and other Non-Viable, Callus, Exudate, Fibrin/Slough, Subcutaneous material debrided: Instrument: Curette Bleeding: Minimum Hemostasis Achieved: Pressure End Time: 10:50 Procedural Pain: 0 Post Procedural Pain: 0 Response to Treatment: Procedure was tolerated well Post Debridement Measurements of Total Wound Length: (cm) 1.1 Stage: Category/Stage II Width: (cm) 4.6 Depth: (cm) 0.2 Volume: (cm) 0.795 Character of Wound/Ulcer Post Improved Debridement: Severity of Tissue Post Fat layer exposed Debridement: Post Procedure Diagnosis Same as Pre-procedure Electronic  Signature(s) Signed: 08/12/2016 5:47:08 PM By: [name] Signed: 08/14/2016 1:37:32 AM By: Worthy Keeler PA-C Entered By: [name] on 08/12/2016 10:50:13 [name], [name] ([id number]) [name], [name] ([id number]) -------------------------------------------------------------------------------- HPI Details Patient Name: [name] Date of Service: 08/12/2016 10:00 AM Medical Record Number: [id number] Patient Account Number: [id number] Date of Birth/Sex: [birth year] (60 y.o. Male) Treating RN: [name] Primary Care Physician: [name] Other Clinician: Referring Physician: [name] Treating Physician/Extender: STONE III, HOYT Weeks in Treatment: 18 History of Present Illness HPI Description: 04/08/16; this is a patient we really don't know too much about. He was hospitalized from 3/24 through 4/6  residing with a sodium of 167 creatinine of 2.49 right lower lobe pneumonia sepsis/septic shock syndrome. He apparently came back to [facility] He apparently had been there only 2 weeks before he went out. I know very little about him premorbidly. The hospital he had a CT scan of the head that showed atrophy and chronic small vessel white matter ischemic changes and remote infarcts also noted within the bilateral basal ganglia. His echocardiogram and presentation showed a EF of 20-25%, left ventricle was moderately dilated with diffuse hypokinesis. Apparently he return to his assisted living with wounds on his bilateral feet. According to the attendant came with him he could walk before he went out he could no longer walk now. There are wounds on both heels covered with a black eschar and also wounds on his bilateral dorsal feet. ABIs calculated in our clinic for 0.49 bilaterally. He was a smoker up to 2 months ago he is not smoking now. 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 Monophasic waves distally bilaterally. He is going back at the end of the month I believe June 29 probably for an arteriogram as arranged by Dr. Delana Meyer in the meantime he has had no real changes 04/30/16; we are waiting vascular review on June 29. The black eschar over his bilateral heels is beginning to separate using Santyl. The areas on the dorsal aspect of his feet looked considerably better/resolved. He apparently has no complaints. He also has a severe presumably ischemic cardiomyopathy as noted above 05/21/16:pt returns today for ongoing evaluation of wounds. I reviewed recent venous and arterial diagnostic findings. He is scheduled for a LLE angio on 05/26/16 and RLE angio on 06/02/16 for advanced evaluation of PAD as noted per lower extremity arterial duplex. BLE venous duplex was negative for venous reflux, DVT or insufficiency. 06/10/16; this is a patient I have not seen in over a month although he was seen here on 7/12. When I admitted him to our clinic I felt he had pressure ulcerations but also potential for limb threatening ischemia. I referred him urgently to vascular surgery. Since we have last seen him in this clinic he has undergone 2 procedures. On 7/17 he will underwent percutaneous transluminal angioplasty of the left above-knee popliteal artery as well as the mid to distal SFA. On 7/27 he underwent for cutaneous transluminal angioplasty of the right popliteal artery and SFA an angioplasty of the proximal SFA. He had a stent placement in the right popliteal artery. The patient is difficult to gauge symptomatology although he does not currently complain of pain. Our intake nurse reports purulent drainage out of the left heel and an odor. 06/17/2016 -- right heel x-ray and left heel x-ray -- IMPRESSION:No radiographic evidence of osteomyelitis. Soft  tissue changes are consistent with cellulitis. He had a wound culture which is positive for abundant Citrobacter fundi and pseudomonas aeruginosa. Hughes, Larry ([id number]) Both are sensitive to ciprofloxacin and at the present time the patient is on doxycycline 100 mg, prescribed by Dr. Dellia Nims last week. I will prescribe ciprofloxacin for him today. 07/16/16 it is been quite a while since I've seen these wounds however both of them are quite a bit better. Using [place], being followed by [facility]. 07/30/16; I see this man every 2 weeks. He  had ischemic wounds on his bilateral heels. He has been using Santyl with [facility] coming out to his facility 08/12/16 Patient comes in today for a follow-up visit we see him every 2 weeks at this point in time. He has bilateral heel wounds the we have been applying Santyl to and he has home health coming out to this facility for these dressing changes. These wounds seem to be improving dramatically since he had the procedures with vascular to stent and improve his blood flow in the bilateral lower extremities. Currently he has been responding well to debridement with Dr. Dellia Nims as well. He tells me at this point he really has no sniffing discomfort or pain at this time. He is tolerating the dressing changes well at this point as well. Electronic Signature(s) Signed: 08/14/2016 1:37:32 AM By: Worthy Keeler PA-C Entered By: Worthy Keeler on 08/12/2016 11:23:17 [name] ([id number]) -------------------------------------------------------------------------------- Physical Exam Details Patient Name: [name] Date of Service: 08/12/2016 10:00 AM Medical Record Number: [id number] Patient Account Number: [id number] Date of Birth/Sex: 11-06-1956 (60 y.o. Male) Treating RN: [name] Primary Care Physician: [name] Other Clinician: Referring Physician: [name] Treating Physician/Extender: STONE III, HOYT Weeks  in Treatment: 39 Constitutional Well-nourished and well-hydrated in no acute distress. Respiratory normal breathing without difficulty. clear to auscultation bilaterally. Cardiovascular regular rate and rhythm with normal S1, S2. no clubbing, cyanosis, edema, <3 sec cap refill. Psychiatric Patient is not able to cooperate in decision making regarding care. pleasant and cooperative. Electronic Signature(s) Signed: 08/14/2016 1:37:32 AM By: Worthy Keeler PA-C Entered By: Worthy Keeler on 08/12/2016 11:23:50 [name] ([id number]) -------------------------------------------------------------------------------- Physician Orders Details Patient Name: [name] Date of Service: 08/12/2016 10:00 AM Medical Record Number: [id number] Patient Account Number: [id number] Date of Birth/Sex: 10-30-1956 (60 y.o. Male) Treating RN: [name] Primary Care Physician: [name] Other Clinician: Referring Physician: [name] Treating Physician/Extender: Melburn Hake, HOYT Weeks in Treatment: 18 Verbal / Phone Orders: Yes Clinician: [name] Read Back and Verified: Yes Diagnosis Coding Wound Cleansing Wound #3 Left Calcaneus o Clean wound with Normal Saline. o Cleanse wound with mild soap and water Wound #4 Right Calcaneus o Clean wound with Normal Saline. o Cleanse wound with mild soap and water Anesthetic Wound #3 Left Calcaneus o Topical Lidocaine 4% cream applied to wound bed prior to debridement - office use only Wound #4 Right Calcaneus o Topical Lidocaine 4% cream applied to wound bed prior to debridement - office use only Primary Wound Dressing Wound #3 Left Calcaneus o Santyl Ointment Wound #4 Right Calcaneus o Santyl Ointment Secondary Dressing Wound #3 Left Calcaneus o ABD pad - foam to top of foot for protection o Dry Gauze o Conform/Kerlix - tape, netting Wound #4 Right Calcaneus o ABD pad - foam to top of foot for  protection o Dry Gauze o Conform/Kerlix - tape, netting Dressing Change Frequency Wound #3 Left Calcaneus [name], [name] ([id number]) o Change dressing every other day. Wound #4 Right Calcaneus o Change dressing every other day. Follow-up Appointments Wound #3 Left Calcaneus o Return Appointment in 2 weeks. Wound #4 Right Calcaneus o Return Appointment in 2 weeks. Off-Loading Wound #3 Left Calcaneus o Heel suspension boot to: - pt to wear sage boots, please float heels o Turn and reposition every 2 hours o Other: - Hoyer Lift Wound #4 Right Calcaneus o Heel suspension boot to: - pt to wear sage boots, please float heels o Turn and reposition every 2 hours o  Other: - Hoyer Lift Additional Orders / Instructions Wound #3 Left Calcaneus o Increase protein intake. Wound #4 Right Calcaneus o Increase protein intake. Home Health Wound #3 Left Calcaneus o Continue Home Health Visits - [place] *****Please Order [place] for patient***** o Home Health Nurse may visit PRN to address patientos wound care needs. o FACE TO FACE ENCOUNTER: MEDICARE and MEDICAID PATIENTS: I certify that this patient is under my care and that I had a face-to-face encounter that meets the physician face-to-face encounter requirements with this patient on this date. The encounter with the patient was in whole or in part for the following MEDICAL CONDITION: (primary reason for [place]) MEDICAL NECESSITY: I certify, that based on my findings, NURSING services are a medically necessary home health service. HOME BOUND STATUS: I certify that my clinical findings support that this patient is homebound (i.e., Due to illness or injury, pt requires aid of supportive devices such as crutches, cane, wheelchairs, walkers, the use of special transportation or the assistance of another person to leave their place of residence. There is a normal inability to leave the home  and doing so requires considerable and taxing effort. Other absences are for medical reasons / religious services and are infrequent or of short duration when for other reasons). [name], [name] ([id number]) o If current dressing causes regression in wound condition, may D/C ordered dressing product/s and apply Normal Saline Moist Dressing daily until next [place] / Other MD appointment. [place] of regression in wound condition at [phone]. o Please direct any NON-WOUND related issues/requests for orders to patient's Primary Care Physician Wound #4 Right Calcaneus o [place] Visits - [place] *****Please Order [place] for patient***** o Home Health Nurse may visit PRN to address patientos wound care needs. o FACE TO FACE ENCOUNTER: MEDICARE and MEDICAID PATIENTS: I certify that this patient is under my care and that I had a face-to-face encounter that meets the physician face-to-face encounter requirements with this patient on this date. The encounter with the patient was in whole or in part for the following MEDICAL CONDITION: (primary reason for [place]) MEDICAL NECESSITY: I certify, that based on my findings, NURSING services are a medically necessary home health service. HOME BOUND STATUS: I certify that my clinical findings support that this patient is homebound (i.e., Due to illness or injury, pt requires aid of supportive devices such as crutches, cane, wheelchairs, walkers, the use of special transportation or the assistance of another person to leave their place of residence. There is a normal inability to leave the home and doing so requires considerable and taxing effort. Other absences are for medical reasons / religious services and are infrequent or of short duration when for other reasons). o If current dressing causes regression in wound condition, may D/C ordered dressing product/s and apply  Normal Saline Moist Dressing daily until next [place] / Other MD appointment. [place] of regression in wound condition at [phone]. o Please direct any NON-WOUND related issues/requests for orders to patient's Primary Care Physician Medications-please add to medication list. Wound #3 Left Calcaneus o Santyl Enzymatic Ointment o Santyl Enzymatic Ointment o Other: - Vitamin C, Zinc, Multivitamin o Other: - Vitamin C, Zinc, Multivitamin Wound #4 Right Calcaneus o Santyl Enzymatic Ointment o Santyl Enzymatic Ointment o Other: - Vitamin C, Zinc, Multivitamin o Other: - Vitamin C, Zinc, Multivitamin Electronic Signature(s) Signed: 08/12/2016 5:47:08 PM By: [name] Signed:  08/14/2016 1:37:32 AM By: Worthy Keeler PA-C Entered By: [name] on 08/12/2016 10:51:08 [name] ([id number]) -------------------------------------------------------------------------------- Problem List Details Patient Name: [name] Date of Service: 08/12/2016 10:00 AM Medical Record Number: [id number] Patient Account Number: [id number] Date of Birth/Sex: 11/06/1956 (60 y.o. 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 Male) Treating RN: [name] Primary Care Physician: [name] Other Clinician: Referring Physician: [name] Treating Physician/Extender: Melburn Hake, HOYT Weeks in Treatment: 18 Subjective Chief Complaint Information obtained from Patient The patient is here for wounds on his bilateral feet supposedly obtained during a hospitalization from 3/24 through 4/6 History of Present Illness (HPI) 04/08/16; this is a patient we really don't know too much about. He was hospitalized from 3/24 through 4/6 residing with a sodium of 167 creatinine of 2.49 right lower lobe pneumonia sepsis/septic shock syndrome. He apparently came back to [facility] He apparently had been there only 2 weeks before he went out. I know very little about him premorbidly. The hospital he had a CT scan of the head that showed atrophy and chronic small vessel white matter ischemic changes and remote infarcts also noted within the bilateral basal ganglia. His echocardiogram and presentation showed a EF of 20-25%, left ventricle was moderately dilated with diffuse hypokinesis. Apparently he return to his assisted living with wounds on his bilateral feet. According to the attendant came with him he could walk before he went out he could no longer walk now. There are wounds on both heels covered with a black eschar and also wounds on his bilateral dorsal feet. ABIs calculated in our clinic for 0.49 bilaterally. He was a smoker up to 2 months ago he is not smoking now. 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 Monophasic waves distally bilaterally. He is going back at the end of the month I believe June 29 probably for an arteriogram as arranged by Dr. Delana Meyer in the meantime he has had no real changes 04/30/16; we are waiting vascular review on June 29. The black eschar over his bilateral heels is beginning to separate using Santyl. The areas on the dorsal aspect of his feet looked considerably better/resolved. He apparently has no complaints. He also has a severe presumably ischemic cardiomyopathy as noted above 05/21/16:pt returns today for ongoing evaluation of wounds. I reviewed recent venous and arterial diagnostic findings. He is scheduled for a LLE angio on 05/26/16 and RLE angio on 06/02/16 for advanced evaluation of PAD as noted per lower extremity arterial duplex. BLE venous  duplex was negative for venous reflux, DVT or insufficiency. 06/10/16; this is a patient I have not seen in over a month although he was seen here on 7/12. When I admitted him to our clinic I felt he had pressure ulcerations but also potential for limb threatening ischemia. I referred him urgently to vascular surgery. Since we have last seen him in this clinic he has undergone 2 procedures. On 7/17 he will underwent percutaneous transluminal angioplasty of the left above-knee popliteal artery as well as the mid to distal SFA. On 7/27 he underwent for cutaneous transluminal angioplasty of the right popliteal artery and SFA an angioplasty of the proximal SFA. He had a stent placement in the right popliteal artery. The patient is difficult to gauge symptomatology although he does not [name], [name] ([id number]) currently complain of pain. Our intake nurse reports purulent drainage out of the left heel and an odor. 06/17/2016 -- right heel x-ray and left heel x-ray -- IMPRESSION:No radiographic  evidence of osteomyelitis. Soft tissue changes are consistent with cellulitis. He had a wound culture which is positive for abundant Citrobacter fundi and pseudomonas aeruginosa. Both are sensitive to ciprofloxacin and at the present time the patient is on doxycycline 100 mg, prescribed by Dr. Dellia Nims last week. I will prescribe ciprofloxacin for him today. 07/16/16 it is been quite a while since I've seen these wounds however both of them are quite a bit better. Using [place], being [facility] by [facility]. 07/30/16; I see this man every 2 weeks. He had ischemic wounds on his bilateral heels. He has been using Santyl with [facility] coming out to his facility 08/12/16 Patient comes in today for a follow-up visit we see him every 2 weeks at this point in time. He has bilateral heel wounds the we have been applying Santyl to and he has home health coming out to this facility for these dressing changes. These wounds seem to be improving dramatically since he had the procedures with vascular to stent and improve his blood flow in the bilateral lower extremities. Currently he has been responding well to debridement with Dr. Dellia Nims as well. He tells me at this point he really has no sniffing discomfort or pain at this time. He is tolerating the dressing changes well at this point as well. Objective Constitutional Well-nourished and well-hydrated in no acute distress. Vitals Time Taken: 10:17 AM, Height: 70 in, Temperature: 98.2 F, Pulse: 76 bpm, Respiratory Rate: 18 breaths/min, Blood Pressure: 108/68 mmHg. Respiratory normal breathing without difficulty. clear to auscultation bilaterally. Cardiovascular regular rate and rhythm with normal S1, S2. no clubbing, cyanosis, edema, Psychiatric Patient is not able to cooperate in decision making regarding care. pleasant and cooperative. Integumentary (Hair, Skin) Wound #3 status is Open. Original cause of wound was Pressure Injury. The  wound is located on the Left Calcaneus. The wound measures 1.1cm length x 4.6cm width x 0.2cm depth; 3.974cm^2 area and 0.795cm^3 volume. The wound is limited to skin breakdown. There is no tunneling or undermining noted. There is a large amount of purulent drainage noted. The wound margin is flat and intact. There is large (67- [name], [name] ([id number]) 100%) red granulation within the wound bed. There is a small (1-33%) amount of necrotic tissue within the wound bed including Eschar and Adherent Slough. The periwound skin appearance exhibited: Dry/Scaly, Moist. The periwound skin appearance did not exhibit: Callus, Crepitus, Excoriation, Fluctuance, Friable, Induration, Localized Edema, Rash, Scarring, Maceration, Atrophie Blanche, Cyanosis, Ecchymosis, Hemosiderin Staining, Mottled, Pallor, Rubor,  Erythema. Periwound temperature was noted as No Abnormality. The periwound has tenderness on palpation. Wound #4 status is Open. Original cause of wound was Pressure Injury. The wound is located on the Right Calcaneus. The wound measures 0.7cm length x 1.7cm width x 0.2cm depth; 0.935cm^2 area and 0.187cm^3 volume. The wound is limited to skin breakdown. There is no tunneling or undermining noted. There is a large amount of purulent drainage noted. The wound margin is flat and intact. There is large (67- 100%) red granulation within the wound bed. There is a small (1-33%) amount of necrotic tissue within the wound bed including Eschar. The periwound skin appearance exhibited: Dry/Scaly, Moist. The periwound skin appearance did not exhibit: Callus, Crepitus, Excoriation, Fluctuance, Friable, Induration, Localized Edema, Rash, Scarring, Maceration, Atrophie Blanche, Cyanosis, Ecchymosis, Hemosiderin Staining, Mottled, Pallor, Rubor, Erythema. Periwound temperature was noted as No Abnormality. The periwound has tenderness on palpation. 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 There was a Skin/Subcutaneous Tissue Debridement [id number] debridement with total area of 5.06 sq cm performed by STONE III, HOYT. with the following instrument(s): Curette to remove Non-Viable tissue/material including Exudate, Fibrin/Slough, Callus, and Subcutaneous after achieving pain control using Lidocaine 4% Topical Solution. Hughes, Larry ([id number]) A time out was conducted at 10:46, prior to the start of the procedure. A Minimum amount of bleeding was controlled with Pressure. The procedure was tolerated well with a pain level of 0 throughout and a pain level of 0 following the procedure. Post Debridement Measurements: 1.1cm length x 4.6cm width x 0.2cm depth; 0.795cm^3 volume. Post debridement Stage noted as Category/Stage II. Character of Wound/Ulcer Post Debridement is improved. Severity of Tissue Post Debridement is: Fat layer exposed. Post procedure Diagnosis Wound #3: Same as Pre-Procedure Wound #4 Wound #4 is a Pressure Ulcer located on the Right Calcaneus . There was a Skin/Subcutaneous Tissue Debridement [id number] debridement with total area of 1.19 sq cm performed by STONE III, HOYT. with the following instrument(s): Curette to remove Non-Viable tissue/material including Exudate, Fibrin/Slough, Callus, and Subcutaneous  after achieving pain control using Lidocaine 4% Topical Solution. A time out was conducted at 10:42, prior to the start of the procedure. A Minimum amount of bleeding was controlled with Pressure. The procedure was tolerated well with a pain level of 0 throughout and a pain level of 0 following the procedure. Post Debridement Measurements: 0.7cm length x 1.7cm width x 0.2cm depth; 0.187cm^3 volume. Post debridement Stage noted as Category/Stage II. Character of Wound/Ulcer Post Debridement is improved. Severity of Tissue Post Debridement is: Fat layer exposed. Post procedure Diagnosis Wound #4: Same as Pre-Procedure After discussing risks and benefits of the procedure with patient I then utilizing sharp debridement technique and a curet was able to remove exudate, callus, slough, and cleanup both wounds of the bilateral heels very well today. Patient tolerated the procedure without significant pain or complication. Plan Wound Cleansing: Wound #3 Left  Calcaneus: Clean wound with Normal Saline. Cleanse wound with mild soap and water Wound #4 Right Calcaneus: Clean wound with Normal Saline. Cleanse wound with mild soap and water Anesthetic: Wound #3 Left Calcaneus: Topical Lidocaine 4% cream applied to wound bed prior to debridement - office use only Wound #4 Right Calcaneus: Topical Lidocaine 4% cream applied to wound bed prior to debridement - office use only Primary Wound Dressing: Wound #3 Left Calcaneus: Santyl Ointment Wound #4 Right Calcaneus: Hughes, Larry ([id number]) Santyl Ointment Secondary Dressing: Wound #3 Left Calcaneus: ABD pad - foam to top of foot for protection Dry Gauze Conform/Kerlix - tape, netting Wound #4 Right Calcaneus: ABD pad - foam to top of foot for protection Dry Gauze Conform/Kerlix - tape, netting Dressing Change Frequency: Wound #3 Left Calcaneus: Change dressing every other day. Wound #4 Right Calcaneus: Change dressing every other  day. Follow-up Appointments: Wound #3 Left Calcaneus: Return Appointment in 2 weeks. Wound #4 Right Calcaneus: Return Appointment in 2 weeks. Off-Loading: Wound #3 Left Calcaneus: Heel suspension boot to: - pt to wear sage boots, please float heels Turn and reposition every 2 hours Other: - Hoyer Lift Wound #4 Right Calcaneus: Heel suspension boot to: - pt to wear sage boots, please float heels Turn and reposition every 2 hours Other: - Advertising account planner Orders / Instructions: Wound #3 Left Calcaneus: Increase protein intake. Wound #4 Right Calcaneus: Increase protein intake. Home Health: Wound #3 Left Calcaneus: Continue Home Health Visits - [place] *****Please Order [place] for patient***** Home Health Nurse may visit PRN to address patient s wound care needs. FACE TO FACE ENCOUNTER: MEDICARE and MEDICAID PATIENTS: I certify that this patient is under my care and that I had a face-to-face encounter that meets the physician face-to-face encounter requirements with this patient on this date. The encounter with the patient was in whole or in part for the following MEDICAL CONDITION: (primary reason for [place]) MEDICAL NECESSITY: I certify, that based on my findings, NURSING services are a medically necessary home health service. HOME BOUND STATUS: I certify that my clinical findings support that this patient is homebound (i.e., Due to illness or injury, pt requires aid of supportive devices such as crutches, cane, wheelchairs, walkers, the use of special transportation or the assistance of another person to leave their place of residence. There is a normal inability to leave the home and doing so requires considerable and taxing effort. Other absences are for medical reasons / religious services and are infrequent or of short duration when for other reasons). If current dressing causes regression in wound condition, may D/C ordered dressing product/s and  apply Normal Saline Moist Dressing daily until next [place] / Other MD appointment. [place] of regression in wound condition at [phone]. [name], [name] ([id number]) Please direct any NON-WOUND related issues/requests for orders to patient's Primary Care Physician Wound #4 Right Calcaneus: [place] Visits - [place] *****Please Order [place] for patient***** Home Health Nurse may visit PRN to address patient s wound care needs. FACE TO FACE ENCOUNTER: MEDICARE and MEDICAID PATIENTS: I certify that this patient is under my care and that I had a face-to-face encounter that meets the physician face-to-face encounter requirements with this patient on this date. The encounter with the patient was in whole or in part for the following MEDICAL CONDITION: (primary reason for [place]) MEDICAL NECESSITY: I certify, that based on my findings, NURSING services are a medically necessary  home health service. HOME BOUND STATUS: I certify that my clinical findings support that this patient is homebound (i.e., Due to illness or injury, pt requires aid of supportive devices such as crutches, cane, wheelchairs, walkers, the use of special transportation or the assistance of another person to leave their place of residence. There is a normal inability to leave the home and doing so requires considerable and taxing effort. Other absences are for medical reasons / religious services and are infrequent or of short duration when for other reasons). If current dressing causes regression in wound condition, may D/C ordered dressing product/s and apply Normal Saline Moist Dressing daily until next [place] / Other MD appointment. [place] of regression in wound condition at [phone]. Please direct any NON-WOUND related issues/requests for orders to patient's Primary Care Physician Medications-please add to medication  list.: Wound #3 Left Calcaneus: Santyl Enzymatic Ointment Santyl Enzymatic Ointment Other: - Vitamin C, Zinc, Multivitamin Other: - Vitamin C, Zinc, Multivitamin Wound #4 Right Calcaneus: Santyl Enzymatic Ointment Santyl Enzymatic Ointment Other: - Vitamin C, Zinc, Multivitamin Other: - Vitamin C, Zinc, Multivitamin Follow-Up Appointments: A follow-up appointment should be scheduled. A Patient Clinical Summary of Care was provided to DG At this point in time today patient's bilateral heel wounds appear to be improving significantly. Again it appears that the vascular intervention has made a significant improvement overall in his healing ability. I do recommend that we continue with the Santyl daily at this point in time and then subsequently we will see him back for a follow-up visit in 2 weeks which is scheduled the we have had him on at this point in time. This should continue to allow for cleanup of the necrotic tissue in between sharp debridement follow-ups. All questions were encouraged and answered patient as well as his caregiver today and we will see him back for a follow-up visit in 2 weeks. [name], [name] ([id number]) Electronic Signature(s) Signed: 09/03/2016 4:55:04 PM By: Worthy Keeler PA-C Previous Signature: 08/14/2016 1:37:32 AM Version By: Worthy Keeler PA-C Entered By: Worthy Keeler on 09/03/2016 16:55:04 [place], Lloyd ([id number]) -------------------------------------------------------------------------------- SuperBill Details Patient Name: [name] Date of Service: 08/12/2016 Medical Record Number: [id number] Patient Account Number: [id number] Date of Birth/Sex: 01/30/1956 (60 y.o. 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 Facility Procedures CPT4: Description Modifier Quantity Code [id number] - DEB SUBQ TISSUE 20 SQ CM/< 1 ICD-10 Description Diagnosis L89.610 Pressure ulcer of right heel, unstageable L89.620 Pressure ulcer of left heel, unstageable I70.234 Atherosclerosis of native  arteries of right leg with ulceration of heel and midfoot I70.245 Atherosclerosis of native arteries of left leg with ulceration of other part of foot Physician Procedures CPT4: Description Modifier Quantity Code [id number] - WC PHYS SUBQ TISS 20 SQ CM 1 ICD-10 Description Diagnosis L89.610 Pressure ulcer of right heel, unstageable L89.620 Pressure ulcer of left heel, unstageable I70.234 Atherosclerosis of native  arteries of right leg with ulceration of heel and midfoot I70.245 Atherosclerosis of native arteries of left leg with ulceration of other part of foot Electronic Signature(s) [place], Michele ([id number]) Signed:  08/14/2016 1:37:32 AM By: Worthy Keeler PA-C Entered By: Worthy Keeler on 08/12/2016 12:14:42

## 2016-08-26 ENCOUNTER — Encounter: Payer: Medicare Other | Admitting: Internal Medicine

## 2016-08-26 DIAGNOSIS — L89610 Pressure ulcer of right heel, unstageable: Secondary | ICD-10-CM | POA: Diagnosis not present

## 2016-08-27 NOTE — Progress Notes (Signed)
CAMAURI, HONTS ([id number]) Visit Report for 08/26/2016 Chief Complaint Document Details Patient Name: [name], [name] Date of Service: 08/26/2016 11:00 AM Medical Record Patient Account Number: [id number] Number: Treating RN: Ahmed Prima January 07, 1956 (60 y.o. Other Clinician: Date of Birth/Sex: Male) Treating ROBSON, MICHAEL Primary Care Physician: [name] Physician/Extender: G Referring Physician: Claudie Revering in Treatment: 20 Information Obtained from: Patient Chief Complaint The patient is here for wounds on his bilateral feet supposedly obtained during a hospitalization from 3/24 through 4/6 Electronic Signature(s) Signed: 08/27/2016 8:01:59 AM By: Linton Ham MD Entered By: Linton Ham on 08/26/2016 12:39:41 [name] ([id number]) -------------------------------------------------------------------------------- Debridement Details Patient Name: [name] Date of Service: 08/26/2016 11:00 AM Medical Record Patient Account Number: [id number] Number: Treating RN: Ahmed Prima 1956/07/20 (60 y.o. Other Clinician: Date of Birth/Sex: Male) Treating ROBSON, [place] Primary Care Physician: [name] Physician/Extender: G Referring Physician: Claudie Revering in Treatment: 20 Debridement Performed for Wound #3 Left Calcaneus Assessment: Performed By: Physician [name], MD Debridement: Debridement Pre-procedure Yes - 11:27 Verification/Time Out Taken: Start Time: 11:30 Pain Control: Lidocaine 4% Topical Solution Level: Skin/Subcutaneous Tissue Total Area Debrided (L x 1 (cm) x 4.5 (cm) = 4.5 (cm) W): Tissue and other Viable, Non-Viable, Exudate, Fibrin/Slough, Subcutaneous material debrided: Instrument: Curette Bleeding: Minimum Hemostasis Achieved: Pressure End Time: 11:32 Procedural Pain: 0 Post Procedural Pain: 0 Response to Treatment: Procedure was tolerated well Post Debridement  Measurements of Total Wound Length: (cm) 1 Stage: Category/Stage II Width: (cm) 4.5 Depth: (cm) 0.2 Volume: (cm) 0.707 Character of Wound/Ulcer Post Requires Further Debridement: Debridement Severity of Tissue Post Fat layer exposed Debridement: Post Procedure Diagnosis Same as Pre-procedure Electronic Signature(s) Signed: 08/26/2016 5:14:55 PM By: Aldine Contes, [name] ([id number]) Signed: 08/27/2016 8:01:59 AM By: Linton Ham MD Entered By: Linton Ham on 08/26/2016 12:37:50 [name] ([id number]) -------------------------------------------------------------------------------- Debridement Details Patient Name: [name] Date of Service: 08/26/2016 11:00 AM Medical Record Patient Account Number: [id number] Number: Treating RN: Ahmed Prima 07-Nov-1956 (60 y.o. Other Clinician: Date of Birth/Sex: Male) Treating ROBSON, [place] Primary Care Physician: [name] Physician/Extender: G Referring Physician: Claudie Revering in Treatment: 20 Debridement Performed for Wound #4 Right Calcaneus Assessment: Performed By: Physician [name], MD Debridement: Debridement Pre-procedure Yes - 11:27 Verification/Time Out Taken: Start Time: 11:28 Pain Control: Lidocaine 4% Topical Solution Level: Skin/Subcutaneous Tissue Total Area Debrided (L x 0.6 (cm) x 1.8 (cm) = 1.08 (cm) W): Tissue and other Viable, Non-Viable, Exudate, Fibrin/Slough, Subcutaneous material debrided: Instrument: Curette Bleeding: Minimum Hemostasis Achieved: Pressure End Time: 11:30 Procedural Pain: 0 Post Procedural Pain: 0 Response to Treatment: Procedure was tolerated well Post Debridement Measurements of Total Wound Length: (cm) 0.6 Stage: Category/Stage II Width: (cm) 1.8 Depth: (cm) 0.2 Volume: (cm) 0.17 Character of Wound/Ulcer Post Requires Further Debridement: Debridement Severity of Tissue Post Fat layer  exposed Debridement: Post Procedure Diagnosis Same as Pre-procedure Electronic Signature(s) Signed: 08/26/2016 5:14:55 PM By: Aldine Contes, [name] ([id number]) Signed: 08/27/2016 8:01:59 AM By: Linton Ham MD Entered By: Linton Ham on 08/26/2016 12:38:03 [name] ([id number]) -------------------------------------------------------------------------------- HPI Details Patient Name: [name] Date of Service: 08/26/2016 11:00 AM Medical Record Patient Account Number: [id number] Number: Treating RN: Ahmed Prima 08-Jan-1956 (60 y.o. Other Clinician: Date of Birth/Sex: Male) Treating ROBSON, MICHAEL Primary Care Physician: [name] Physician/Extender: G Referring Physician: Claudie Revering in Treatment: 20 History of Present Illness HPI Description: 04/08/16; this is a patient we really don't know too much about. He was hospitalized from 3/24  through 4/6 residing with a sodium of 167 creatinine of 2.49 right lower lobe pneumonia sepsis/septic shock syndrome. He apparently came back to [facility] He apparently had been there only 2 weeks before he went out. I know very little about him premorbidly. The hospital he had a CT scan of the head that showed atrophy and chronic small vessel white matter ischemic changes and remote infarcts also noted within the bilateral basal ganglia. His echocardiogram and presentation showed a EF of 20-25%, left ventricle was moderately dilated with diffuse hypokinesis. Apparently he return to his assisted living with wounds on his bilateral feet. According to the attendant came with him he could walk before he went out he could no longer walk now. There are wounds on both heels covered with a black eschar and also wounds on his bilateral dorsal feet. ABIs calculated in our clinic for 0.49 bilaterally. He was a smoker up to 2 months ago he is not  smoking now. 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 Monophasic waves distally bilaterally. He is going back at the end of the month I believe June 29 probably for an arteriogram as arranged by Dr. Delana Meyer in the meantime he has had no real changes 04/30/16; we are waiting vascular review on June 29. The black eschar over his bilateral heels is beginning to separate using Santyl. The areas on the dorsal aspect of his feet looked considerably better/resolved. He apparently has no complaints. He also has a severe presumably ischemic cardiomyopathy as noted above 05/21/16:pt returns today for ongoing evaluation of wounds. I reviewed recent venous and arterial diagnostic findings. He is scheduled for a LLE angio on 05/26/16 and RLE angio on 06/02/16 for advanced evaluation of PAD as noted per lower extremity arterial duplex. BLE venous duplex was negative for venous reflux, DVT or insufficiency. 06/10/16; this is a patient I have not seen in over a month although he was seen here on 7/12. When I admitted him to our clinic I felt he had pressure ulcerations but also potential for limb threatening ischemia. I referred him urgently to vascular surgery. Since we have last seen him in this clinic he has undergone 2 procedures. On 7/17 he will underwent percutaneous transluminal angioplasty of the left above-knee popliteal artery as well as the mid to distal SFA. On 7/27 he underwent for cutaneous transluminal angioplasty of the right popliteal artery and SFA an angioplasty of the proximal SFA. He had a stent placement in the right popliteal artery. The patient is difficult to gauge symptomatology although he does not currently complain of pain. Our intake nurse reports purulent drainage out of the left heel and an odor. 06/17/2016 -- right  heel x-ray and left heel x-ray -- IMPRESSION:No radiographic evidence of osteomyelitis. Soft tissue changes are Cadena, [place] ([id number]) consistent with cellulitis. He had a wound culture which is positive for abundant Citrobacter fundi and pseudomonas aeruginosa. Both are sensitive to ciprofloxacin and at the present time the patient is on doxycycline 100 mg, prescribed by Dr. Dellia Nims last week. I will prescribe ciprofloxacin for him today. 07/16/16 it is been quite a while since I've seen these wounds however both of them are quite a bit better. Using [place], being followed by [facility]. 07/30/16; I see this man every 2  weeks. He had ischemic wounds on his bilateral heels. He has been using Santyl with [facility] coming out to his facility 08/12/16 Patient comes in today for a follow-up visit we see him every 2 weeks at this point in time. He has bilateral heel wounds the we have been applying Santyl to and he has home health coming out to this facility for these dressing changes. These wounds seem to be improving dramatically since he had the procedures with vascular to stent and improve his blood flow in the bilateral lower extremities. Currently he has been responding well to debridement with Dr. Dellia Nims as well. He tells me at this point he really has no sniffing discomfort or pain at this time. He is tolerating the dressing changes well at this point as well. 08/26/16; patient comes in for his two-week follow-up. He has bilateral heel pressure ulcers that were complicated by severe ischemia now corrected. Has been using Santyl to these areas. Electronic Signature(s) Signed: 08/27/2016 8:01:59 AM By: Linton Ham MD Entered By: Linton Ham on 08/26/2016 12:43:09 [name] ([id number]) -------------------------------------------------------------------------------- Physical Exam Details Patient Name: [name] Date of Service: 08/26/2016 11:00 AM Medical  Record Patient Account Number: [id number] Number: Treating RN: Ahmed Prima 12-Apr-1956 (59 y.o. Other Clinician: Date of Birth/Sex: Male) Treating ROBSON, MICHAEL Primary Care Physician: [name] Physician/Extender: G Referring Physician: Claudie Revering in Treatment: 20 Constitutional Sitting or standing Blood Pressure is within target range for patient.. Pulse regular and within target range for patient.Marland Kitchen Respirations regular, non-labored and within target range.. Temperature is normal and within the target range for the patient.. Patient's appearance is neat and clean. Appears in no acute distress. Well nourished and well developed.. Notes Wound exam; both wounds require debridement of surface slough nonviable subcutaneous tissue circumferential rolled edges of skin. However both of them look very satisfactory no evidence of surrounding infection is seen Electronic Signature(s) Signed: 08/27/2016 8:01:59 AM By: Linton Ham MD Entered By: Linton Ham on 08/26/2016 12:44:01 [name] ([id number]) -------------------------------------------------------------------------------- Physician Orders Details Patient Name: [name] Date of Service: 08/26/2016 11:00 AM Medical Record Patient Account Number: [id number] Number: Treating RN: Ahmed Prima 27-May-1956 (59 y.o. Other Clinician: Date of Birth/Sex: Male) Treating ROBSON, MICHAEL Primary Care Physician: [name] Physician/Extender: G Referring Physician: Claudie Revering in Treatment: 20 Verbal / Phone Orders: Yes Clinician: Pinkerton, Debi Read Back and Verified: Yes Diagnosis Coding Wound Cleansing Wound #3 Left Calcaneus o Clean wound with Normal Saline. o Cleanse wound with mild soap and water Wound #4 Right Calcaneus o Clean wound with Normal Saline. o Cleanse wound with mild soap and water Anesthetic Wound #3 Left Calcaneus o Topical  Lidocaine 4% cream applied to wound bed prior to debridement - office use only Wound #4 Right Calcaneus o Topical Lidocaine 4% cream applied to wound bed prior to debridement - office use only Primary Wound Dressing Wound #3 Left Calcaneus o Prisma Ag - moisten with saline Wound #4 Right Calcaneus o Prisma Ag - moisten with saline Secondary Dressing Wound #3 Left Calcaneus o ABD pad o Dry Gauze o Conform/Kerlix - tape, netting o Foam - to top of foot for protection Wound #4 Right Calcaneus o ABD pad o Dry Gauze o Conform/Kerlix - tape, netting Azucena, [place] ([id number]) o Foam - to top of foot for protection Dressing Change Frequency Wound #3 Left Calcaneus o Change dressing every other day. Wound #4 Right Calcaneus o Change dressing every other day. Follow-up Appointments Wound #3 Left  Calcaneus o Return Appointment in 2 weeks. Wound #4 Right Calcaneus o Return Appointment in 2 weeks. Off-Loading Wound #3 Left Calcaneus o Heel suspension boot to: - pt to wear sage boots, please float heels o Turn and reposition every 2 hours o Other: - Hoyer Lift Wound #4 Right Calcaneus o Heel suspension boot to: - pt to wear sage boots, please float heels o Turn and reposition every 2 hours o Other: - Advertising account planner Orders / Instructions Wound #3 Left Calcaneus o Increase protein intake. Wound #4 Right Calcaneus o Increase protein intake. Home Health Wound #3 Left Calcaneus o Continue Home Health Visits - [place] Nurse may visit PRN to address patientos wound care needs. o FACE TO FACE ENCOUNTER: MEDICARE and MEDICAID PATIENTS: I certify that this patient is under my care and that I had a face-to-face encounter that meets the physician face-to-face encounter requirements with this patient on this date. The encounter with the patient was in whole or in part for the following MEDICAL CONDITION:  (primary reason for [place]) MEDICAL NECESSITY: I certify, that based on my findings, NURSING services are a medically necessary home health service. HOME BOUND STATUS: I certify that my clinical findings support that this patient is homebound (i.e., Due to illness or injury, pt requires aid of supportive devices such as crutches, cane, wheelchairs, walkers, the use of special transportation or the assistance of another person to leave their place of residence. There is a [place], [place] ([id number]) normal inability to leave the home and doing so requires considerable and taxing effort. Other absences are for medical reasons / religious services and are infrequent or of short duration when for other reasons). o If current dressing causes regression in wound condition, may D/C ordered dressing product/s and apply Normal Saline Moist Dressing daily until next [place] / Other MD appointment. [place] of regression in wound condition at [phone]. o Please direct any NON-WOUND related issues/requests for orders to patient's Primary Care Physician Wound #4 Right Calcaneus o [place] Visits - [place] Nurse may visit PRN to address patientos wound care needs. o FACE TO FACE ENCOUNTER: MEDICARE and MEDICAID PATIENTS: I certify that this patient is under my care and that I had a face-to-face encounter that meets the physician face-to-face encounter requirements with this patient on this date. The encounter with the patient was in whole or in part for the following MEDICAL CONDITION: (primary reason for [place]) MEDICAL NECESSITY: I certify, that based on my findings, NURSING services are a medically necessary home health service. HOME BOUND STATUS: I certify that my clinical findings support that this patient is homebound (i.e., Due to illness or injury, pt requires aid of supportive devices such as  crutches, cane, wheelchairs, walkers, the use of special transportation or the assistance of another person to leave their place of residence. There is a normal inability to leave the home and doing so requires considerable and taxing effort. Other absences are for medical reasons / religious services and are infrequent or of short duration when for other reasons). o If current dressing causes regression in wound condition, may D/C ordered dressing product/s and apply Normal Saline Moist Dressing daily until next [place] / Other MD appointment. [place] of regression in wound condition at [phone]. o Please direct any NON-WOUND related issues/requests for orders to patient's Primary Care Physician Medications-please add to medication list.  Wound #3 Left Calcaneus o Other: - Vitamin C, Zinc, Multivitamin Wound #4 Right Calcaneus o Other: - Vitamin C, Zinc, Multivitamin Electronic Signature(s) Signed: 08/26/2016 5:14:55 PM By: Alric Quan Signed: 08/27/2016 8:01:59 AM By: Linton Ham MD Entered By: Alric Quan on 08/26/2016 11:33:01 [name] ([id number]) -------------------------------------------------------------------------------- Problem List Details Patient Name: [name] Date of Service: 08/26/2016 11:00 AM Medical Record Patient Account Number: [id number] Number: Treating RN: Ahmed Prima August 06, 1956 (59 y.o. 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 Other Clinician: Date of Birth/Sex: Male) Treating ROBSON, MICHAEL Primary Care Physician: [name] Physician/Extender: G Referring Physician: Claudie Revering in Treatment: 20 Subjective Chief Complaint Information obtained from Patient The patient is here for wounds on his bilateral feet supposedly obtained during a hospitalization from 3/24 through 4/6 History of Present Illness (HPI) 04/08/16; this is a patient we really don't know too much about. He was hospitalized from 3/24 through 4/6 residing with a sodium of 167 creatinine of 2.49 right lower lobe pneumonia sepsis/septic shock syndrome. He apparently came back to [facility] He apparently had been there only 2 weeks before he went out. I know very little about him premorbidly. The hospital he had a CT scan of the head that showed atrophy and chronic small vessel white matter ischemic changes and remote infarcts also noted within the bilateral basal ganglia. His echocardiogram and presentation showed a EF of 20-25%, left ventricle was moderately dilated with diffuse hypokinesis. Apparently he return to his assisted living with wounds on his bilateral feet. According to the attendant came with him he could walk before he went out he could no longer walk now. There are wounds  on both heels covered with a black eschar and also wounds on his bilateral dorsal feet. ABIs calculated in our clinic for 0.49 bilaterally. He was a smoker up to 2 months ago he is not smoking now. 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 Monophasic waves distally bilaterally. He is going back at the end of the month I believe June 29 probably for an arteriogram as arranged by Dr. Delana Meyer in the meantime he has had no real changes 04/30/16; we are waiting vascular review on June 29. The black eschar over his bilateral heels is beginning to separate using Santyl. The areas on the dorsal aspect of his feet looked considerably better/resolved. He apparently has no complaints. He also has a severe presumably ischemic cardiomyopathy as noted above 05/21/16:pt returns today for ongoing evaluation of wounds. I reviewed recent venous and arterial diagnostic  findings. He is scheduled for a LLE angio on 05/26/16 and RLE angio on 06/02/16 for advanced evaluation of PAD as noted per lower extremity arterial duplex. BLE venous duplex was negative for venous reflux, DVT or insufficiency. 06/10/16; this is a patient I have not seen in over a month although he was seen here on 7/12. When I admitted him to our clinic I felt he had pressure ulcerations but also potential for limb threatening ischemia. I referred him urgently to vascular surgery. Since we have last seen him in this clinic he has undergone 2 procedures. On 7/17 he will underwent percutaneous transluminal angioplasty of the left above-knee popliteal artery as well as the mid to distal SFA. On 7/27 he underwent for cutaneous transluminal Kestner, [place] ([id number]) angioplasty of the right popliteal artery and SFA an angioplasty of the proximal SFA. He had a stent placement in the right  popliteal artery. The patient is difficult to gauge symptomatology although he does not currently complain of pain. Our intake nurse reports purulent drainage out of the left heel and an odor. 06/17/2016 -- right heel x-ray and left heel x-ray -- IMPRESSION:No radiographic evidence of osteomyelitis. Soft tissue changes are consistent with cellulitis. He had a wound culture which is positive for abundant Citrobacter fundi and pseudomonas aeruginosa. Both are sensitive to ciprofloxacin and at the present time the patient is on doxycycline 100 mg, prescribed by Dr. Dellia Nims last week. I will prescribe ciprofloxacin for him today. 07/16/16 it is been quite a while since I've seen these wounds however both of them are quite a bit better. Using [place], being followed by [facility]. 07/30/16; I see this man every 2 weeks. He had ischemic wounds on his bilateral heels. He has been using Santyl with [facility] coming out to his facility 08/12/16 Patient comes in today for a follow-up visit we see him every 2 weeks at this point in time. He has bilateral heel wounds the we have been applying Santyl to and he has home health coming out to this facility for these dressing changes. These wounds seem to be improving dramatically since he had the procedures with vascular to stent and improve his blood flow in the bilateral lower extremities. Currently he has been responding well to debridement with Dr. Dellia Nims as well. He tells me at this point he really has no sniffing discomfort or pain at this time. He is tolerating the dressing changes well at this point as well. 08/26/16; patient comes in for his two-week follow-up. He has bilateral heel pressure ulcers that were complicated by severe ischemia now corrected. Has been using Santyl to these areas. Objective Constitutional Sitting or standing Blood Pressure is within target range for patient.. Pulse regular and within target range for patient.Marland Kitchen  Respirations regular, non-labored and within target range.. Temperature is normal and within the target range for the patient.. Patient's appearance is neat and clean. Appears in no acute distress. Well nourished and well developed.. Vitals Time Taken: 11:05 AM, Height: 70 in, Temperature: 97.7 F, Pulse: 60 bpm, Respiratory Rate: 16 breaths/min, Blood Pressure: 110/70 mmHg. General Notes: Wound exam; both wounds require debridement of surface slough nonviable subcutaneous tissue circumferential rolled edges of skin. However both of them look very satisfactory no evidence of surrounding infection is seen Integumentary (Hair, Skin) Wound #3 status is Open. Original cause of wound was Pressure Injury. The wound is located on the Left Calcaneus. The wound measures 1cm length x 4.5cm width x 0.2cm depth; 3.534cm^2  area and Helming, Cristina ([id number]) 0.707cm^3 volume. The wound is limited to skin breakdown. There is no tunneling or undermining noted. There is a large amount of purulent drainage noted. The wound margin is flat and intact. There is large (67- 100%) red granulation within the wound bed. There is a small (1-33%) amount of necrotic tissue within the wound bed including Eschar and Adherent Slough. The periwound skin appearance exhibited: Dry/Scaly, Moist. The periwound skin appearance did not exhibit: Callus, Crepitus, Excoriation, Fluctuance, Friable, Induration, Localized Edema, Rash, Scarring, Maceration, Atrophie Blanche, Cyanosis, Ecchymosis, Hemosiderin Staining, Mottled, Pallor, Rubor, Erythema. Periwound temperature was noted as No Abnormality. The periwound has tenderness on palpation. Wound #4 status is Open. Original cause of wound was Pressure Injury. The wound is located on the Right Calcaneus. The wound measures 0.6cm length x 1.8cm width x 0.2cm depth; 0.848cm^2 area and 0.17cm^3 volume. The wound is limited to skin breakdown. There is a large amount of purulent  drainage noted. The wound margin is flat and intact. There is large (67-100%) red granulation within the wound bed. There is a small (1-33%) amount of necrotic tissue within the wound bed including Eschar. The periwound skin appearance exhibited: Dry/Scaly, Moist. The periwound skin appearance did not exhibit: Callus, Crepitus, Excoriation, Fluctuance, Friable, Induration, Localized Edema, Rash, Scarring, Maceration, Atrophie Blanche, Cyanosis, Ecchymosis, Hemosiderin Staining, Mottled, Pallor, Rubor, Erythema. Periwound temperature was noted as No Abnormality. The periwound has tenderness on palpation. 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 There was a Skin/Subcutaneous Tissue Debridement [id number] debridement with total area of 4.5 sq cm performed by [name], MD. with the following instrument(s): Curette to remove Viable and Non-Viable tissue/material including Exudate, Fibrin/Slough, and Subcutaneous after achieving pain control using Lidocaine 4% Topical Solution. A time out was conducted at 11:27, prior to the start of the procedure. A Minimum amount of bleeding was controlled with Pressure. The procedure was tolerated well with a pain level of 0 throughout and a pain level of 0 following the procedure. Post Debridement Measurements: 1cm length x 4.5cm width x 0.2cm depth; 0.707cm^3 volume. Post debridement Stage noted as Category/Stage II. Character of Wound/Ulcer Post Debridement requires further debridement. Severity of Tissue Post Debridement is: Fat layer exposed. Weyrauch, Hamp ([id number]) Post procedure  Diagnosis Wound #3: Same as Pre-Procedure Wound #4 Wound #4 is a Pressure Ulcer located on the Right Calcaneus . There was a Skin/Subcutaneous Tissue Debridement [id number] debridement with total area of 1.08 sq cm performed by [name], MD. with the following instrument(s): Curette to remove Viable and Non-Viable tissue/material including Exudate, Fibrin/Slough, and Subcutaneous after achieving pain control using Lidocaine 4% Topical Solution. A time out was conducted at 11:27, prior to the start of the procedure. A Minimum amount of bleeding was controlled with Pressure. The procedure was tolerated well with a pain level of 0 throughout and a pain level of 0 following the procedure. Post Debridement Measurements: 0.6cm length x 1.8cm width x 0.2cm depth; 0.17cm^3 volume. Post debridement Stage noted as Category/Stage II. Character of Wound/Ulcer Post Debridement requires further debridement. Severity of Tissue Post Debridement is: Fat layer exposed. Post procedure Diagnosis Wound #4: Same as  Pre-Procedure Plan Wound Cleansing: Wound #3 Left Calcaneus: Clean wound with Normal Saline. Cleanse wound with mild soap and water Wound #4 Right Calcaneus: Clean wound with Normal Saline. Cleanse wound with mild soap and water Anesthetic: Wound #3 Left Calcaneus: Topical Lidocaine 4% cream applied to wound bed prior to debridement - office use only Wound #4 Right Calcaneus: Topical Lidocaine 4% cream applied to wound bed prior to debridement - office use only Primary Wound Dressing: Wound #3 Left Calcaneus: Prisma Ag - moisten with saline Wound #4 Right Calcaneus: Prisma Ag - moisten with saline Secondary Dressing: Wound #3 Left Calcaneus: ABD pad Dry Gauze Conform/Kerlix - tape, netting Foam - to top of foot for protection Wound #4 Right Calcaneus: ABD pad Dry Gauze Conform/Kerlix - tape, netting Kuennen, [place] ([id number]) Foam - to top of foot for protection Dressing  Change Frequency: Wound #3 Left Calcaneus: Change dressing every other day. Wound #4 Right Calcaneus: Change dressing every other day. Follow-up Appointments: Wound #3 Left Calcaneus: Return Appointment in 2 weeks. Wound #4 Right Calcaneus: Return Appointment in 2 weeks. Off-Loading: Wound #3 Left Calcaneus: Heel suspension boot to: - pt to wear sage boots, please float heels Turn and reposition every 2 hours Other: - Hoyer Lift Wound #4 Right Calcaneus: Heel suspension boot to: - pt to wear sage boots, please float heels Turn and reposition every 2 hours Other: - Advertising account planner Orders / Instructions: Wound #3 Left Calcaneus: Increase protein intake. Wound #4 Right Calcaneus: Increase protein intake. Home Health: Wound #3 Left Calcaneus: Continue Home Health Visits - [place] Nurse may visit PRN to address patient s wound care needs. FACE TO FACE ENCOUNTER: MEDICARE and MEDICAID PATIENTS: I certify that this patient is under my care and that I had a face-to-face encounter that meets the physician face-to-face encounter requirements with this patient on this date. The encounter with the patient was in whole or in part for the following MEDICAL CONDITION: (primary reason for [place]) MEDICAL NECESSITY: I certify, that based on my findings, NURSING services are a medically necessary home health service. HOME BOUND STATUS: I certify that my clinical findings support that this patient is homebound (i.e., Due to illness or injury, pt requires aid of supportive devices such as crutches, cane, wheelchairs, walkers, the use of special transportation or the assistance of another person to leave their place of residence. There is a normal inability to leave the home and doing so requires considerable and taxing effort. Other absences are for medical reasons / religious services and are infrequent or of short duration when for other reasons). If current  dressing causes regression in wound condition, may D/C ordered dressing product/s and apply Normal Saline Moist Dressing daily until next [place] / Other MD appointment. [facility] of regression in wound condition at [phone]. Please direct any NON-WOUND related issues/requests for orders to patient's Primary Care Physician Wound #4 Right Calcaneus: [place] Nurse may visit PRN to address patient s wound care needs. FACE TO FACE ENCOUNTER: MEDICARE and MEDICAID PATIENTS: I certify that this patient is under my care and that I had a face-to-face encounter that meets the physician face-to-face encounter requirements with this patient on this date. The encounter with the patient was in whole or in part for the following MEDICAL CONDITION: (primary reason for [place]) MEDICAL NECESSITY: I certify, that based on my findings, NURSING services are a medically necessary  home health service. HOME [place], Sanav ([id number]) BOUND STATUS: I certify that my clinical findings support that this patient is homebound (i.e., Due to illness or injury, pt requires aid of supportive devices such as crutches, cane, wheelchairs, walkers, the use of special transportation or the assistance of another person to leave their place of residence. There is a normal inability to leave the home and doing so requires considerable and taxing effort. Other absences are for medical reasons / religious services and are infrequent or of short duration when for other reasons). If current dressing causes regression in wound condition, may D/C ordered dressing product/s and apply Normal Saline Moist Dressing daily until next [place] / Other MD appointment. [place] of regression in wound condition at [phone]. Please direct any NON-WOUND related issues/requests for orders to patient's Primary Care  Physician Medications-please add to medication list.: Wound #3 Left Calcaneus: Other: - Vitamin C, Zinc, Multivitamin Wound #4 Right Calcaneus: Other: - Vitamin C, Zinc, Multivitamin #1 ready to move on to Prisma to see if we can promote some epithelialization. [facility] were coming into his group home facility Electronic Signature(s) Signed: 08/27/2016 8:01:59 AM By: Linton Ham MD Entered By: Linton Ham on 08/26/2016 12:44:31 [name] ([id number]) -------------------------------------------------------------------------------- SuperBill Details Patient Name: [name] Date of Service: 08/26/2016 Medical Record Patient Account Number: [id number] Number: Treating RN: Ahmed Prima [birth year] (59 y.o. 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 Facility Procedures CPT4 Code: [id number] Description: [id number] - DEB SUBQ TISSUE 20 SQ CM/< ICD-10 Description Diagnosis L89.620 Pressure ulcer of left heel, unstageable L89.610 Pressure ulcer of right heel, unstageable Modifier: Quantity: 1 Physician Procedures CPT4 Code: [id number] Description: [id number] - WC PHYS SUBQ TISS 20 SQ CM ICD-10 Description Diagnosis L89.620 Pressure ulcer of left heel, unstageable L89.610 Pressure ulcer of right heel, unstageable Modifier: Quantity: 1 Electronic Signature(s) Signed: 08/27/2016 8:01:59 AM By: Linton Ham MD Entered By: Linton Ham on 08/26/2016 12:44:56

## 2016-08-27 NOTE — Progress Notes (Signed)
Larry, Hughes ([id number]) Visit Report for 08/26/2016 Arrival Information Details Patient Name: [name], [name] Date of Service: 08/26/2016 11:00 AM Medical Record Number: [id number] Patient Account Number: [id number] Date of Birth/Sex: 01-08-1956 (60 y.o. Male) Treating RN: Ahmed Prima Primary Care Physician: [name] Other Clinician: Referring Physician: [name] Treating Physician/Extender: [name] in Treatment: 20 Visit Information History Since Last Visit All ordered tests and consults were completed: No Patient Arrived: Wheel Chair Added or deleted any medications: No Arrival Time: 11:02 Any new allergies or adverse reactions: No Accompanied By: caregiver Had a fall or experienced change in No Transfer Assistance: EasyP[employer] activities of daily living that may affect Patient Lift risk of falls: Patient Identification Verified: Yes Signs or symptoms of abuse/neglect since last No Secondary Verification Process Yes visito Completed: Hospitalized since last visit: No Patient Requires Transmission- No Pain Present Now: No Based Precautions: Patient Has Alerts: No Electronic Signature(s) Signed: 08/26/2016 5:14:55 PM By: Alric Quan Entered By: Alric Quan on 08/26/2016 11:05:04 Lawyer, [name] ([id number]) -------------------------------------------------------------------------------- Encounter Discharge Information Details Patient Name: [name] Date of Service: 08/26/2016 11:00 AM Medical Record Number: [id number] Patient Account Number: [id number] Date of Birth/Sex: 1956/06/04 (60 y.o. Male) Treating RN: Ahmed Prima Primary Care Physician: [name] Other Clinician: Referring Physician: [name] Treating Physician/Extender: [name] in Treatment: 20 Encounter Discharge Information Items Discharge Pain Level: 0 Discharge Condition: Stable Ambulatory Status: Wheelchair Discharge  Destination: Nursing Home Transportation: Other Accompanied By: caregiver Schedule Follow-up Appointment: Yes Medication Reconciliation completed and provided to Patient/Care Yes Zeynep Fantroy: Provided on Clinical Summary of Care: 08/26/2016 Form Type Recipient Paper Patient DG Electronic Signature(s) Signed: 08/26/2016 11:45:10 AM By: [name] Entered By: [name] on 08/26/2016 11:45:10 [name] ([id number]) -------------------------------------------------------------------------------- Lower Extremity Assessment Details Patient Name: [name] Date of Service: 08/26/2016 11:00 AM Medical Record Number: [id number] Patient Account Number: [id number] Date of Birth/Sex: 1956/09/03 (60 y.o. Male) Treating RN: Ahmed Prima Primary Care Physician: [name] Other Clinician: Referring Physician: [name] Treating Physician/Extender: [name] in Treatment: 20 Vascular Assessment Pulses: Posterior Tibial Dorsalis Pedis Palpable: [Left:Yes] [Right:Yes] Extremity colors, hair growth, and conditions: Extremity Color: [Left:Normal] [Right:Normal] Temperature of Extremity: [Left:Warm] [Right:Warm] Capillary Refill: [Left:< 3 seconds] [Right:< 3 seconds] Electronic Signature(s) Signed: 08/26/2016 5:14:55 PM By: Alric Quan Entered By: Alric Quan on 08/26/2016 11:08:04 Meneely, [name] ([id number]) -------------------------------------------------------------------------------- Multi Wound Chart Details Patient Name: [name] Date of Service: 08/26/2016 11:00 AM Medical Record Number: [id number] Patient Account Number: [id number] Date of Birth/Sex: [birth year] (60 y.o. Male) Treating RN: Carolyne Fiscal, Debi Primary Care Physician: [name] Other Clinician: Referring Physician: [name] Treating Physician/Extender: [name] in Treatment: 20 Vital Signs Height(in): 70 Pulse(bpm): 60 Weight(lbs): Blood  Pressure 110/70 (mmHg): Body Mass Index(BMI): Temperature(F): 97.7 Respiratory Rate 16 (breaths/min): Photos: [3:No Photos] [4:No Photos] [N/A:N/A] Wound Location: [3:Left Calcaneus] [4:Right Calcaneus] [N/A:N/A] Wounding Event: [3:Pressure Injury] [4:Pressure Injury] [N/A:N/A] Primary Etiology: [3:Pressure Ulcer] [4:Pressure Ulcer] [N/A:N/A] Comorbid History: [3:Anemia] [4:Anemia] [N/A:N/A] Date Acquired: [3:02/07/2016] [4:02/07/2016] [N/A:N/A] Weeks of Treatment: [3:20] [4:20] [N/A:N/A] Wound Status: [3:Open] [4:Open] [N/A:N/A] Measurements L x W x D 1x4.5x0.2 [4:0.6x1.8x0.2] [N/A:N/A] (cm) Area (cm) : [3:3.534] [4:0.848] [N/A:N/A] Volume (cm) : [id number] [4:0.17] [N/A:N/A] % Reduction in Area: [3:83.50%] [4:93.30%] [N/A:N/A] % Reduction in Volume: 66.90% [4:86.50%] [N/A:N/A] Classification: [3:Category/Stage II] [4:Category/Stage II] [N/A:N/A] Exudate Amount: [3:Large] [4:Large] [N/A:N/A] Exudate Type: [3:Purulent] [4:Purulent] [N/A:N/A] Exudate Color: [3:yellow, brown, Guerreiro] [4:yellow, brown, Feigel] [N/A:N/A] Foul Odor After [3:Yes] [4:Yes] [N/A:N/A] Cleansing: Odor Anticipated Due to No [  4:No] [N/A:N/A] Product Use: Wound Margin: [3:Flat and Intact] [4:Flat and Intact] [N/A:N/A] Granulation Amount: [3:Large (67-100%)] [4:Large (67-100%)] [N/A:N/A] Granulation Quality: [3:Red] [4:Red] [N/A:N/A] Necrotic Amount: [3:Small (1-33%)] [4:Small (1-33%)] [N/A:N/A] Necrotic Tissue: [3:Eschar, Adherent Slough] [4:Eschar] [N/A:N/A] Exposed Structures: [3:Fascia: No Fat: No] [4:Fascia: No Fat: No] [N/A:N/A] Tendon: No Tendon: No Muscle: No Muscle: No Joint: No Joint: No Bone: No Bone: No Limited to Skin Limited to Skin Breakdown Breakdown Epithelialization: Small (1-33%) Small (1-33%) N/A Periwound Skin Texture: Edema: No Edema: No N/A Excoriation: No Excoriation: No Induration: No Induration: No Callus: No Callus: No Crepitus: No Crepitus: No Fluctuance:  No Fluctuance: No Friable: No Friable: No Rash: No Rash: No Scarring: No Scarring: No Periwound Skin Moist: Yes Moist: Yes N/A Moisture: Dry/Scaly: Yes Dry/Scaly: Yes Maceration: No Maceration: No Periwound Skin Color: Atrophie Blanche: No Atrophie Blanche: No N/A Cyanosis: No Cyanosis: No Ecchymosis: No Ecchymosis: No Erythema: No Erythema: No Hemosiderin Staining: No Hemosiderin Staining: No Mottled: No Mottled: No Pallor: No Pallor: No Rubor: No Rubor: No Temperature: No Abnormality No Abnormality N/A Tenderness on Yes Yes N/A Palpation: Wound Preparation: Ulcer Cleansing: Ulcer Cleansing: N/A Rinsed/Irrigated with Rinsed/Irrigated with Saline Saline Topical Anesthetic Topical Anesthetic Applied: Other: lidocaine Applied: Other: lidocaine 4% 4% Treatment Notes Electronic Signature(s) Signed: 08/26/2016 5:14:55 PM By: Alric Quan Entered By: Alric Quan on 08/26/2016 11:19:48 Bebo, [place] ([id number]) -------------------------------------------------------------------------------- Multi-Disciplinary Care Plan Details Patient Name: [name] Date of Service: 08/26/2016 11:00 AM Medical Record Number: [id number] Patient Account Number: [id number] Date of Birth/Sex: May 23, 1956 (60 y.o. Male) Treating RN: Ahmed Prima Primary Care Physician: [name] Other Clinician: Referring Physician: [name] Treating Physician/Extender: [name] in Treatment: 20 Active Inactive Abuse / Safety / Falls / Self Care Management Nursing Diagnoses: Potential for falls Goals: Patient will remain injury free Date Initiated: 04/08/2016 Goal Status: Active Interventions: Assess fall risk on admission and as needed Notes: Nutrition Nursing Diagnoses: Imbalanced nutrition Goals: Patient/caregiver agrees to and verbalizes understanding of need to use nutritional supplements and/or vitamins as prescribed Date Initiated:  04/08/2016 Goal Status: Active Interventions: Assess patient nutrition upon admission and as needed per policy Notes: Orientation to the Wound Care Program Nursing Diagnoses: Knowledge deficit related to the wound healing center program Goals: Patient/caregiver will verbalize understanding of the [place] ([id number]) Date Initiated: 04/08/2016 Goal Status: Active Interventions: Provide education on orientation to the wound center Notes: Pain, Acute or Chronic Nursing Diagnoses: Pain, acute or chronic: actual or potential Potential alteration in comfort, pain Goals: Patient will verbalize adequate pain control and receive pain control interventions during procedures as needed Date Initiated: 04/08/2016 Goal Status: Active Interventions: Assess comfort goal upon admission Complete pain assessment as per visit requirements Notes: Pressure Nursing Diagnoses: Knowledge deficit related to causes and risk factors for pressure ulcer development Knowledge deficit related to management of pressures ulcers Goals: Patient will remain free from development of additional pressure ulcers Date Initiated: 04/08/2016 Goal Status: Active Interventions: Assess offloading mechanisms upon admission and as needed Assess potential for pressure ulcer upon admission and as needed Notes: Wound/Skin Impairment Nursing Diagnoses: Impaired tissue integrity Shuford, Castulo ([id number]) Goals: Ulcer/skin breakdown will have a volume reduction of 30% by week 4 Date Initiated: 04/08/2016 Goal Status: Active Ulcer/skin breakdown will have a volume reduction of 50% by week 8 Date Initiated: 04/08/2016 Goal Status: Active Ulcer/skin breakdown will have a volume reduction of 80% by week 12 Date Initiated: 04/08/2016 Goal Status: Active Interventions: Assess ulceration(s) every visit Notes:  Electronic Signature(s) Signed: 08/26/2016 5:14:55 PM By: Alric Quan Entered  By: Alric Quan on 08/26/2016 11:18:38 Aries, [name] ([id number]) -------------------------------------------------------------------------------- Pain Assessment Details Patient Name: [name] Date of Service: 08/26/2016 11:00 AM Medical Record Number: [id number] Patient Account Number: [id number] Date of Birth/Sex: Oct 17, 1956 (60 y.o. Male) Treating RN: Ahmed Prima Primary Care Physician: [name] Other Clinician: Referring Physician: [name] Treating Physician/Extender: [name] in Treatment: 20 Active Problems Location of Pain Severity and Description of Pain Patient Has Paino No Site Locations With Dressing Change: No Pain Management and Medication Current Pain Management: Electronic Signature(s) Signed: 08/26/2016 5:14:55 PM By: Alric Quan Entered By: Alric Quan on 08/26/2016 11:05:09 [place], [name] ([id number]) -------------------------------------------------------------------------------- Patient/Caregiver Education Details Patient Name: [name] Date of Service: 08/26/2016 11:00 AM Medical Record Number: [id number] Patient Account Number: [id number] Date of Birth/Gender: 05-29-1956 (60 y.o. Male) Treating RN: Ahmed Prima Primary Care Physician: [name] Other Clinician: Referring Physician: [name] Treating Physician/Extender: [name] in Treatment: 20 Education Assessment Education Provided To: Patient Education Topics Provided Wound/Skin Impairment: Handouts: Other: change dressing as ordered Methods: Demonstration, Explain/Verbal Responses: State content correctly Electronic Signature(s) Signed: 08/26/2016 5:14:55 PM By: Alric Quan Entered By: Alric Quan on 08/26/2016 11:23:23 [place], [name] ([id number]) -------------------------------------------------------------------------------- Wound Assessment Details Patient Name: [name] Date of Service:  08/26/2016 11:00 AM Medical Record Number: [id number] Patient Account Number: [id number] Date of Birth/Sex: Dec 21, 1955 (60 y.o. Male) Treating RN: Carolyne Fiscal, Debi Primary Care Physician: [name] Other Clinician: Referring Physician: [name] Treating Physician/Extender: [name] in Treatment: 20 Wound Status Wound Number: 3 Primary Etiology: Pressure Ulcer Wound Location: Left Calcaneus Wound Status: Open Wounding Event: Pressure Injury Comorbid History: Anemia Date Acquired: 02/07/2016 Weeks Of Treatment: 20 Clustered Wound: No Photos Photo Uploaded By: Alric Quan on 08/26/2016 11:24:40 Wound Measurements Length: (cm) 1 Width: (cm) 4.5 Depth: (cm) 0.2 Area: (cm) 3.534 Volume: (cm) 0.707 % Reduction in Area: 83.5% % Reduction in Volume: 66.9% Epithelialization: Small (1-33%) Tunneling: No Undermining: No Wound Description Classification: Category/Stage II Wound Margin: Flat and Intact Exudate Amount: Large Exudate Type: Purulent Exudate Color: yellow, brown, Thomason Foul Odor After Cleansing: Yes Due to Product Use: No Wound Bed Granulation Amount: Large (67-100%) Exposed Structure Granulation Quality: Red Fascia Exposed: No Necrotic Amount: Small (1-33%) Fat Layer Exposed: No Necrotic Quality: Eschar, Adherent Slough Tendon Exposed: No Fanara, Sadrac ([id number]) Muscle Exposed: No Joint Exposed: No Bone Exposed: No Limited to Skin Breakdown Periwound Skin Texture Texture Color No Abnormalities Noted: No No Abnormalities Noted: No Callus: No Atrophie Blanche: No Crepitus: No Cyanosis: No Excoriation: No Ecchymosis: No Fluctuance: No Erythema: No Friable: No Hemosiderin Staining: No Induration: No Mottled: No Localized Edema: No Pallor: No Rash: No Rubor: No Scarring: No Temperature / Pain Moisture Temperature: No Abnormality No Abnormalities Noted: No Tenderness on Palpation: Yes Dry / Scaly:  Yes Maceration: No Moist: Yes Wound Preparation Ulcer Cleansing: Rinsed/Irrigated with Saline Topical Anesthetic Applied: Other: lidocaine 4%, Treatment Notes Wound #3 (Left Calcaneus) 1. Cleansed with: Clean wound with Normal Saline 2. Anesthetic Topical Lidocaine 4% cream to wound bed prior to debridement 4. Dressing Applied: Prisma Ag 5. Secondary Dressing Applied ABD Pad Dry Gauze Foam Kerlix/Conform 7. Secured with Recruitment consultant) Signed: 08/26/2016 5:14:55 PM By: Alric Quan Entered By: Alric Quan on 08/26/2016 11:17:32 Hashimi, Mando ([id number]) [place], [name] ([id number]) -------------------------------------------------------------------------------- Wound Assessment Details Patient Name: [name] Date of Service: 08/26/2016 11:00 AM Medical Record Number: [id number] Patient Account Number: [id number] Date of  Birth/Sex: 30-Apr-1956 (60 y.o. Male) Treating RN: Carolyne Fiscal, Debi Primary Care Physician: [name] Other Clinician: Referring Physician: [name] Treating Physician/Extender: [name] in Treatment: 20 Wound Status Wound Number: 4 Primary Etiology: Pressure Ulcer Wound Location: Right Calcaneus Wound Status: Open Wounding Event: Pressure Injury Comorbid History: Anemia Date Acquired: 02/07/2016 Weeks Of Treatment: 20 Clustered Wound: No Photos Photo Uploaded By: Alric Quan on 08/26/2016 11:24:56 Wound Measurements Length: (cm) 0.6 Width: (cm) 1.8 Depth: (cm) 0.2 Area: (cm) 0.848 Volume: (cm) 0.17 % Reduction in Area: 93.3% % Reduction in Volume: 86.5% Epithelialization: Small (1-33%) Wound Description Classification: Category/Stage II Wound Margin: Flat and Intact Exudate Amount: Large Exudate Type: Purulent Exudate Color: yellow, brown, Sutter Foul Odor After Cleansing: Yes Due to Product Use: No Wound Bed Granulation Amount: Large (67-100%) Exposed Structure Granulation  Quality: Red Fascia Exposed: No Necrotic Amount: Small (1-33%) Fat Layer Exposed: No Necrotic Quality: Eschar Tendon Exposed: No [name], [place] ([id number]) Muscle Exposed: No Joint Exposed: No Bone Exposed: No Limited to Skin Breakdown Periwound Skin Texture Texture Color No Abnormalities Noted: No No Abnormalities Noted: No Callus: No Atrophie Blanche: No Crepitus: No Cyanosis: No Excoriation: No Ecchymosis: No Fluctuance: No Erythema: No Friable: No Hemosiderin Staining: No Induration: No Mottled: No Localized Edema: No Pallor: No Rash: No Rubor: No Scarring: No Temperature / Pain Moisture Temperature: No Abnormality No Abnormalities Noted: No Tenderness on Palpation: Yes Dry / Scaly: Yes Maceration: No Moist: Yes Wound Preparation Ulcer Cleansing: Rinsed/Irrigated with Saline Topical Anesthetic Applied: Other: lidocaine 4%, Treatment Notes Wound #4 (Right Calcaneus) 1. Cleansed with: Clean wound with Normal Saline 2. Anesthetic Topical Lidocaine 4% cream to wound bed prior to debridement 4. Dressing Applied: Prisma Ag 5. Secondary Dressing Applied ABD Pad Dry Gauze Foam Kerlix/Conform 7. Secured with Recruitment consultant) Signed: 08/26/2016 5:14:55 PM By: Alric Quan Entered By: Alric Quan on 08/26/2016 11:18:28 DERREN, LAVERNE ([id number]) Hartley, [name] ([id number]) -------------------------------------------------------------------------------- Vitals Details Patient Name: [name] Date of Service: 08/26/2016 11:00 AM Medical Record Number: [id number] Patient Account Number: [id number] Date of Birth/Sex: [birth year] (60 y.o. Male) Treating RN: Carolyne Fiscal, Debi Primary Care Physician: [name] Other Clinician: Referring Physician: [name] Treating Physician/Extender: [name] in Treatment: 20 Vital Signs Time Taken: 11:05 Temperature (F): 97.7 Height (in): 70 Pulse (bpm): 60 Respiratory  Rate (breaths/min): 16 Blood Pressure (mmHg): 110/70 Reference Range: 80 - 120 mg / dl Electronic Signature(s) Signed: 08/26/2016 5:14:55 PM By: Alric Quan Entered By: Alric Quan on 08/26/2016 11:07:29

## 2016-09-09 ENCOUNTER — Ambulatory Visit: Payer: Medicare Other | Admitting: Physician Assistant

## 2016-09-17 ENCOUNTER — Encounter: Payer: Medicare Other | Attending: Nurse Practitioner | Admitting: Nurse Practitioner

## 2016-09-17 DIAGNOSIS — I70234 Atherosclerosis of native arteries of right leg with ulceration of heel and midfoot: Secondary | ICD-10-CM | POA: Diagnosis not present

## 2016-09-17 DIAGNOSIS — I70245 Atherosclerosis of native arteries of left leg with ulceration of other part of foot: Secondary | ICD-10-CM | POA: Diagnosis not present

## 2016-09-17 DIAGNOSIS — Z993 Dependence on wheelchair: Secondary | ICD-10-CM | POA: Insufficient documentation

## 2016-09-17 DIAGNOSIS — L89623 Pressure ulcer of left heel, stage 3: Secondary | ICD-10-CM | POA: Diagnosis not present

## 2016-09-17 DIAGNOSIS — Z8673 Personal history of transient ischemic attack (TIA), and cerebral infarction without residual deficits: Secondary | ICD-10-CM | POA: Diagnosis not present

## 2016-09-17 DIAGNOSIS — L89613 Pressure ulcer of right heel, stage 3: Secondary | ICD-10-CM | POA: Diagnosis not present

## 2016-09-18 NOTE — Progress Notes (Addendum)
[name] ([id number]) Visit Report for 09/17/2016 Arrival Information Details Patient Name: [name], [name] Date of Service: 09/17/2016 1:30 PM Medical Record Number: [id number] Patient Account Number: [id number] Date of Birth/Sex: 03-27-1956 (60 y.o. Male) Treating RN: Ahmed Prima Primary Care Physician: [name] Other Clinician: Referring Physician: [name] Treating Physician/Extender: Cathie Olden in Treatment: 23 Visit Information History Since Last Visit All ordered tests and consults were completed: No Patient Arrived: Wheel Chair Added or deleted any medications: No Arrival Time: 13:36 Any new allergies or adverse reactions: No Accompanied By: caregiver Had a fall or experienced change in No Transfer Assistance: [employer] activities of daily living that may affect Patient Lift risk of falls: Patient Identification Verified: Yes Signs or symptoms of abuse/neglect since last No Secondary Verification Process Yes visito Completed: Hospitalized since last visit: No Patient Requires Transmission- No Pain Present Now: No Based Precautions: Patient Has Alerts: No Electronic Signature(s) Signed: 09/17/2016 5:51:11 PM By: Alric Quan Entered By: Alric Quan on 09/17/2016 13:36:55 Chrisman, [name] ([id number]) -------------------------------------------------------------------------------- Encounter Discharge Information Details Patient Name: [name] Date of Service: 09/17/2016 1:30 PM Medical Record Number: [id number] Patient Account Number: [id number] Date of Birth/Sex: 10-Oct-1956 (60 y.o. Male) Treating RN: Ahmed Prima Primary Care Physician: [name] Other Clinician: Referring Physician: [name] Treating Physician/Extender: Cathie Olden in Treatment: 23 Encounter Discharge Information Items Discharge Pain Level: 0 Discharge Condition: Stable Ambulatory Status: Wheelchair Discharge Destination: Nursing  Home Transportation: Other Accompanied By: caregiver Schedule Follow-up Appointment: Yes Medication Reconciliation completed and provided to Patient/Care Yes Aviel Davalos: Provided on Clinical Summary of Care: 09/17/2016 Form Type Recipient Paper Patient DG Electronic Signature(s) Signed: 09/17/2016 2:24:14 PM By: [name] Entered By: [name] on 09/17/2016 14:24:14 Conger, Miramar ([id number]) -------------------------------------------------------------------------------- Lower Extremity Assessment Details Patient Name: [name] Date of Service: 09/17/2016 1:30 PM Medical Record Number: [id number] Patient Account Number: [id number] Date of Birth/Sex: 1956-09-30 (60 y.o. Male) Treating RN: Ahmed Prima Primary Care Physician: [name] Other Clinician: Referring Physician: [name] Treating Physician/Extender: Cathie Olden in Treatment: 23 Vascular Assessment Pulses: Posterior Tibial Dorsalis Pedis Palpable: [Left:Yes] [Right:Yes] Extremity colors, hair growth, and conditions: Extremity Color: [Left:Normal] [Right:Normal] Temperature of Extremity: [Left:Warm] [Right:Warm] Capillary Refill: [Left:< 3 seconds] [Right:< 3 seconds] Electronic Signature(s) Signed: 09/17/2016 5:51:11 PM By: Alric Quan Entered By: Alric Quan on 09/17/2016 13:40:28 Austell, Branton ([id number]) -------------------------------------------------------------------------------- Multi Wound Chart Details Patient Name: [name] Date of Service: 09/17/2016 1:30 PM Medical Record Number: [id number] Patient Account Number: [id number] Date of Birth/Sex: 07/07/1956 (60 y.o. Male) Treating RN: Ahmed Prima Primary Care Physician: [name] Other Clinician: Referring Physician: [name] Treating Physician/Extender: Cathie Olden in Treatment: 23 Vital Signs Height(in): 70 Pulse(bpm): 60 Weight(lbs): Blood Pressure 119/60 (mmHg): Body Mass  Index(BMI): Temperature(F): 97.6 Respiratory Rate 16 (breaths/min): Photos: [3:No Photos] [4:No Photos] [N/A:N/A] Wound Location: [3:Left Calcaneus] [4:Right Calcaneus] [N/A:N/A] Wounding Event: [3:Pressure Injury] [4:Pressure Injury] [N/A:N/A] Primary Etiology: [3:Pressure Ulcer] [4:Pressure Ulcer] [N/A:N/A] Comorbid History: [3:Anemia] [4:Anemia] [N/A:N/A] Date Acquired: [3:02/07/2016] [4:02/07/2016] [N/A:N/A] Weeks of Treatment: [3:23] [4:23] [N/A:N/A] Wound Status: [3:Open] [4:Open] [N/A:N/A] Measurements L x W x D 0.8x3x0.2 [4:0.1x0.1x0.1] [N/A:N/A] (cm) Area (cm) : [3:1.885] [4:0.008] [N/A:N/A] Volume (cm) : [3:0.377] [4:0.001] [N/A:N/A] % Reduction in Area: [3:91.20%] [4:99.90%] [N/A:N/A] % Reduction in Volume: 82.40% [4:99.90%] [N/A:N/A] Classification: [3:Category/Stage II] [4:Category/Stage II] [N/A:N/A] Exudate Amount: [3:Large] [4:Medium] [N/A:N/A] Exudate Type: [3:Serous] [4:Serous] [N/A:N/A] Exudate Color: [3:amber] [4:amber] [N/A:N/A] Foul Odor After [3:No] [4:Yes] [N/A:N/A] Cleansing: Odor Anticipated Due to N/A [4:No] [N/A:N/A] Product Use: Wound Margin: [3:Flat and  Intact] [4:Flat and Intact] [N/A:N/A] Granulation Amount: [3:Medium (34-66%)] [4:None Present (0%)] [N/A:N/A] Granulation Quality: [3:Red] [4:N/A] [N/A:N/A] Necrotic Amount: [3:Medium (34-66%)] [4:Large (67-100%)] [N/A:N/A] Necrotic Tissue: [3:Eschar, Adherent Slough] [4:Eschar] [N/A:N/A] Exposed Structures: [3:Fascia: No Fat: No] [4:Fascia: No Fat: No] [N/A:N/A] Tendon: No Tendon: No Muscle: No Muscle: No Joint: No Joint: No Bone: No Bone: No Limited to Skin Limited to Skin Breakdown Breakdown Epithelialization: Small (1-33%) Small (1-33%) N/A Debridement: Debridement [id number] Debridement [id number] N/A [id number]) [id number]) Pre-procedure 13:57 13:57 N/A Verification/Time Out Taken: Pain Control: Lidocaine 4% Topical Lidocaine 4% Topical N/A Solution Solution Tissue Debrided: Fibrin/Slough,  Exudates, Fibrin/Slough, Exudates, N/A Subcutaneous Subcutaneous Level: Skin/Subcutaneous Skin/Subcutaneous N/A Tissue Tissue Debridement Area (sq 2.4 0.01 N/A cm): Instrument: Curette Curette N/A Bleeding: Minimum Minimum N/A Hemostasis Achieved: Pressure Pressure N/A Procedural Pain: 0 0 N/A Post Procedural Pain: 0 0 N/A Debridement Treatment Procedure was tolerated Procedure was tolerated N/A Response: well well Post Debridement 0.9x0.5x0.3 0.5x2.5x0.3 N/A Measurements L x W x D (cm) Post Debridement 0.106 0.295 N/A Volume: (cm) Post Debridement Category/Stage II Category/Stage II N/A Stage: Periwound Skin Texture: Edema: No Edema: No N/A Excoriation: No Excoriation: No Induration: No Induration: No Callus: No Callus: No Crepitus: No Crepitus: No Fluctuance: No Fluctuance: No Friable: No Friable: No Rash: No Rash: No Scarring: No Scarring: No Periwound Skin Moist: Yes Moist: Yes N/A Moisture: Dry/Scaly: Yes Dry/Scaly: Yes Maceration: No Maceration: No Periwound Skin Color: Atrophie Blanche: No Atrophie Blanche: No N/A Cyanosis: No Cyanosis: No Ecchymosis: No Ecchymosis: No Erythema: No Erythema: No Knerr, Daman ([id number]) Hemosiderin Staining: No Hemosiderin Staining: No Mottled: No Mottled: No Pallor: No Pallor: No Rubor: No Rubor: No Temperature: No Abnormality No Abnormality N/A Tenderness on Yes Yes N/A Palpation: Wound Preparation: Ulcer Cleansing: Ulcer Cleansing: N/A Rinsed/Irrigated with Rinsed/Irrigated with Saline Saline Topical Anesthetic Topical Anesthetic Applied: Other: lidocaine Applied: Other: lidocaine 4% 4% Procedures Performed: Debridement Debridement N/A Treatment Notes Electronic Signature(s) Signed: 09/17/2016 5:51:11 PM By: Alric Quan Entered By: Alric Quan on 09/17/2016 14:21:55 Northington, Zackrey  ([id number]) -------------------------------------------------------------------------------- Multi-Disciplinary Care Plan Details Patient Name: [name] Date of Service: 09/17/2016 1:30 PM Medical Record Number: [id number] Patient Account Number: [id number] Date of Birth/Sex: [birth year] (60 y.o. Male) Treating RN: Ahmed Prima Primary Care Physician: [name] Other Clinician: Referring Physician: [name] Treating Physician/Extender: Cathie Olden in Treatment: 23 Active Inactive Abuse / Safety / Falls / Self Care Management Nursing Diagnoses: Potential for falls Goals: Patient will remain injury free Date Initiated: 04/08/2016 Goal Status: Active Interventions: Assess fall risk on admission and as needed Notes: Nutrition Nursing Diagnoses: Imbalanced nutrition Goals: Patient/caregiver agrees to and verbalizes understanding of need to use nutritional supplements and/or vitamins as prescribed Date Initiated: 04/08/2016 Goal Status: Active Interventions: Assess patient nutrition upon admission and as needed per policy Notes: Orientation to the Wound Care Program Nursing Diagnoses: Knowledge deficit related to the wound healing center program Goals: Patient/caregiver will verbalize understanding of the [place] ([id number]) Date Initiated: 04/08/2016 Goal Status: Active Interventions: Provide education on orientation to the wound center Notes: Pain, Acute or Chronic Nursing Diagnoses: Pain, acute or chronic: actual or potential Potential alteration in comfort, pain Goals: Patient will verbalize adequate pain control and receive pain control interventions during procedures as needed Date Initiated: 04/08/2016 Goal Status: Active Interventions: Assess comfort goal upon admission Complete pain assessment as per visit requirements Notes: Pressure Nursing Diagnoses: Knowledge deficit related to causes and risk  factors for pressure ulcer development Knowledge deficit related to  management of pressures ulcers Goals: Patient will remain free from development of additional pressure ulcers Date Initiated: 04/08/2016 Goal Status: Active Interventions: Assess offloading mechanisms upon admission and as needed Assess potential for pressure ulcer upon admission and as needed Notes: Wound/Skin Impairment Nursing Diagnoses: Impaired tissue integrity Bouldin, Rylie ([id number]) Goals: Ulcer/skin breakdown will have a volume reduction of 30% by week 4 Date Initiated: 04/08/2016 Goal Status: Active Ulcer/skin breakdown will have a volume reduction of 50% by week 8 Date Initiated: 04/08/2016 Goal Status: Active Ulcer/skin breakdown will have a volume reduction of 80% by week 12 Date Initiated: 04/08/2016 Goal Status: Active Interventions: Assess ulceration(s) every visit Notes: Electronic Signature(s) Signed: 09/17/2016 5:51:11 PM By: Alric Quan Entered By: Alric Quan on 09/17/2016 14:21:47 Laroche, Amaar ([id number]) -------------------------------------------------------------------------------- Pain Assessment Details Patient Name: [name] Date of Service: 09/17/2016 1:30 PM Medical Record Number: [id number] Patient Account Number: [id number] Date of Birth/Sex: 1956-05-22 (60 y.o. Male) Treating RN: Ahmed Prima Primary Care Physician: [name] Other Clinician: Referring Physician: [name] Treating Physician/Extender: Cathie Olden in Treatment: 23 Active Problems Location of Pain Severity and Description of Pain Patient Has Paino No Site Locations With Dressing Change: No Pain Management and Medication Current Pain Management: Electronic Signature(s) Signed: 09/17/2016 5:51:11 PM By: Alric Quan Entered By: Alric Quan on 09/17/2016 13:37:03 Cobarrubias, [name]  ([id number]) -------------------------------------------------------------------------------- Patient/Caregiver Education Details Patient Name: [name] Date of Service: 09/17/2016 1:30 PM Medical Record Number: [id number] Patient Account Number: [id number] Date of Birth/Gender: February 24, 1956 (60 y.o. Male) Treating RN: Carolyne Fiscal, Debi Primary Care Physician: [name] Other Clinician: Referring Physician: [name] Treating Physician/Extender: Cathie Olden in Treatment: 23 Education Assessment Education Provided To: Patient Education Topics Provided Wound/Skin Impairment: Handouts: Other: change dressing as ordered Methods: Demonstration, Explain/Verbal Responses: State content correctly Electronic Signature(s) Signed: 09/17/2016 5:51:11 PM By: Alric Quan Entered By: Alric Quan on 09/17/2016 13:56:22 Ground, Addie ([id number]) -------------------------------------------------------------------------------- Wound Assessment Details Patient Name: [name] Date of Service: 09/17/2016 1:30 PM Medical Record Number: [id number] Patient Account Number: [id number] Date of Birth/Sex: Sep 12, 1956 (60 y.o. Male) Treating RN: Carolyne Fiscal, Debi Primary Care Physician: [name] Other Clinician: Referring Physician: [name] Treating Physician/Extender: Cathie Olden in Treatment: 23 Wound Status Wound Number: 3 Primary Etiology: Pressure Ulcer Wound Location: Left Calcaneus Wound Status: Open Wounding Event: Pressure Injury Comorbid History: Anemia Date Acquired: 02/07/2016 Weeks Of Treatment: 23 Clustered Wound: No Photos Wound Measurements Length: (cm) 0.8 Width: (cm) 3 Depth: (cm) 0.2 Area: (cm) 1.885 Volume: (cm) 0.377 % Reduction in Area: 91.2% % Reduction in Volume: 82.4% Epithelialization: Small (1-33%) Tunneling: No Undermining: No Wound Description Classification: Category/Stage III Foul Odor Afte Wound Margin:  Flat and Intact Exudate Amount: Large Exudate Type: Serous Exudate Color: amber r Cleansing: No Wound Bed Granulation Amount: Medium (34-66%) Exposed Structure Granulation Quality: Red Fascia Exposed: No Necrotic Amount: Medium (34-66%) Fat Layer Exposed: Yes Necrotic Quality: Eschar, Adherent Slough Tendon Exposed: No Muscle Exposed: No Meeuwsen, Ermine ([id number]) Joint Exposed: No Bone Exposed: No Periwound Skin Texture Texture Color No Abnormalities Noted: No No Abnormalities Noted: No Callus: No Atrophie Blanche: No Crepitus: No Cyanosis: No Excoriation: No Ecchymosis: No Fluctuance: No Erythema: No Friable: No Hemosiderin Staining: No Induration: No Mottled: No Localized Edema: No Pallor: No Rash: No Rubor: No Scarring: No Temperature / Pain Moisture Temperature: No Abnormality No Abnormalities Noted: No Tenderness on Palpation: Yes Dry / Scaly: Yes Maceration: No Moist: Yes Wound Preparation Ulcer Cleansing: Rinsed/Irrigated with Saline Topical Anesthetic Applied: Other: lidocaine 4%, Treatment  Notes Wound #3 (Left Calcaneus) 1. Cleansed with: Clean wound with Normal Saline 2. Anesthetic Topical Lidocaine 4% cream to wound bed prior to debridement 4. Dressing Applied: Prisma Ag 5. Secondary Dressing Applied Dry Gauze Foam Kerlix/Conform 7. Secured with Tape Notes netting, heel cups Electronic Signature(s) Signed: 09/18/2016 12:06:05 AM By: Lawanda Cousins Signed: 09/22/2016 5:02:43 PM By: Alric Quan Previous Signature: 09/17/2016 5:51:11 PM Version By: Aldine Contes, Laquan ([id number]) Entered By: Lawanda Cousins on 09/18/2016 00:06:05 [name] ([id number]) -------------------------------------------------------------------------------- Wound Assessment Details Patient Name: [name] Date of Service: 09/17/2016 1:30 PM Medical Record Number: [id number] Patient Account Number: [id number] Date of Birth/Sex: [birth year] (60  y.o. Male) Treating RN: Ahmed Prima Primary Care Physician: [name] Other Clinician: Referring Physician: [name] Treating Physician/Extender: Cathie Olden in Treatment: 23 Wound Status Wound Number: 4 Primary Etiology: Pressure Ulcer Wound Location: Right Calcaneus Wound Status: Open Wounding Event: Pressure Injury Comorbid History: Anemia Date Acquired: 02/07/2016 Weeks Of Treatment: 23 Clustered Wound: No Photos Wound Measurements Length: (cm) 0.1 Width: (cm) 0.1 Depth: (cm) 0.1 Area: (cm) 0.008 Volume: (cm) 0.001 % Reduction in Area: 99.9% % Reduction in Volume: 99.9% Epithelialization: Small (1-33%) Tunneling: No Undermining: No Wound Description Classification: Category/Stage III Foul Odor Afte Wound Margin: Flat and Intact Due to Product Exudate Amount: Medium Exudate Type: Serous Exudate Color: amber r Cleansing: Yes Use: No Wound Bed Granulation Amount: None Present (0%) Exposed Structure Necrotic Amount: Large (67-100%) Fascia Exposed: No Necrotic Quality: Eschar Fat Layer Exposed: Yes Tendon Exposed: No Muscle Exposed: No Hutzler, Treston ([id number]) Joint Exposed: No Bone Exposed: No Periwound Skin Texture Texture Color No Abnormalities Noted: No No Abnormalities Noted: No Callus: No Atrophie Blanche: No Crepitus: No Cyanosis: No Excoriation: No Ecchymosis: No Fluctuance: No Erythema: No Friable: No Hemosiderin Staining: No Induration: No Mottled: No Localized Edema: No Pallor: No Rash: No Rubor: No Scarring: No Temperature / Pain Moisture Temperature: No Abnormality No Abnormalities Noted: No Tenderness on Palpation: Yes Dry / Scaly: Yes Maceration: No Moist: Yes Wound Preparation Ulcer Cleansing: Rinsed/Irrigated with Saline Topical Anesthetic Applied: Other: lidocaine 4%, Treatment Notes Wound #4 (Right Calcaneus) 1. Cleansed with: Clean wound with Normal Saline 2. Anesthetic Topical  Lidocaine 4% cream to wound bed prior to debridement 4. Dressing Applied: Prisma Ag 5. Secondary Dressing Applied Dry Gauze Foam Kerlix/Conform 7. Secured with Tape Notes netting, heel cups Electronic Signature(s) Signed: 09/18/2016 12:06:40 AM By: Lawanda Cousins Signed: 09/22/2016 5:02:43 PM By: Alric Quan Previous Signature: 09/17/2016 5:51:11 PM Version By: Aldine Contes, Carlton ([id number]) Entered By: Lawanda Cousins on 09/18/2016 00:06:40 [name] ([id number]) -------------------------------------------------------------------------------- Vitals Details Patient Name: [name] Date of Service: 09/17/2016 1:30 PM Medical Record Number: [id number] Patient Account Number: [id number] Date of Birth/Sex: May 08, 1956 (60 y.o. Male) Treating RN: Ahmed Prima Primary Care Physician: [name] Other Clinician: Referring Physician: [name] Treating Physician/Extender: Cathie Olden in Treatment: 23 Vital Signs Time Taken: 13:37 Temperature (F): 97.6 Height (in): 70 Pulse (bpm): 60 Respiratory Rate (breaths/min): 16 Blood Pressure (mmHg): 119/60 Reference Range: 80 - 120 mg / dl Electronic Signature(s) Signed: 09/17/2016 5:51:11 PM By: Alric Quan Entered By: Alric Quan on 09/17/2016 13:40:07

## 2016-09-23 NOTE — Progress Notes (Signed)
KLYE, CHORLEY ([id number]) Visit Report for 09/17/2016 Chief Complaint Document Details Patient Name: [name], [name] Date of Service: 09/17/2016 1:30 PM Medical Record Number: [id number] Patient Account Number: [id number] Date of Birth/Sex: 08/26/1956 (60 y.o. Male) Treating RN: Ahmed Prima Primary Care Physician: [name] Other Clinician: Referring Physician: [name] Treating Physician/Extender: Cathie Olden in Treatment: 23 Information Obtained from: Patient Chief Complaint here for follow up on bilateral heel pressure ulcers Electronic Signature(s) Signed: 09/18/2016 12:10:17 AM By: Lawanda Cousins Entered By: Lawanda Cousins on 09/18/2016 00:10:17 [name] ([id number]) -------------------------------------------------------------------------------- Debridement Details Patient Name: [name] Date of Service: 09/17/2016 1:30 PM Medical Record Number: [id number] Patient Account Number: [id number] Date of Birth/Sex: 1956/03/14 (60 y.o. Male) Treating RN: Ahmed Prima Primary Care Physician: [name] Other Clinician: Referring Physician: [name] Treating Physician/Extender: Cathie Olden in Treatment: 23 Debridement Performed for Wound #4 Right Calcaneus Assessment: Performed By: Physician Lawanda Cousins, NP Debridement: Debridement Pre-procedure Yes - 13:57 Verification/Time Out Taken: Start Time: 13:58 Pain Control: Lidocaine 4% Topical Solution Level: Skin/Subcutaneous Tissue Total Area Debrided (L x 0.1 (cm) x 0.1 (cm) = 0.01 (cm) W): Tissue and other Viable, Non-Viable, Exudate, Fibrin/Slough, Subcutaneous material debrided: Instrument: Curette Bleeding: Minimum Hemostasis Achieved: Pressure End Time: 14:03 Procedural Pain: 0 Post Procedural Pain: 0 Response to Treatment: Procedure was tolerated well Post Debridement Measurements of Total Wound Length: (cm) 0.5 Stage: Category/Stage III Width: (cm) 2.5 Depth:  (cm) 0.3 Volume: (cm) 0.295 Character of Wound/Ulcer Post Improved Debridement: Severity of Tissue Post Fat layer exposed Debridement: Post Procedure Diagnosis Same as Pre-procedure Electronic Signature(s) Signed: 09/18/2016 12:09:10 AM By: Lawanda Cousins Signed: 09/22/2016 5:02:43 PM By: Alric Quan Previous Signature: 09/17/2016 5:51:11 PM Version By: Aldine Contes, Morgan ([id number]) Entered By: Lawanda Cousins on 09/18/2016 00:09:10 [name] ([id number]) -------------------------------------------------------------------------------- Debridement Details Patient Name: [name] Date of Service: 09/17/2016 1:30 PM Medical Record Number: [id number] Patient Account Number: [id number] Date of Birth/Sex: 1956/04/22 (60 y.o. Male) Treating RN: Ahmed Prima Primary Care Physician: [name] Other Clinician: Referring Physician: [name] Treating Physician/Extender: Cathie Olden in Treatment: 23 Debridement Performed for Wound #3 Left Calcaneus Assessment: Performed By: Physician Lawanda Cousins, NP Debridement: Debridement Pre-procedure Yes - 13:57 Verification/Time Out Taken: Start Time: 14:05 Pain Control: Lidocaine 4% Topical Solution Level: Skin/Subcutaneous Tissue Total Area Debrided (L x 0.8 (cm) x 3 (cm) = 2.4 (cm) W): Tissue and other Viable, Non-Viable, Exudate, Fibrin/Slough, Subcutaneous material debrided: Instrument: Curette Bleeding: Minimum Hemostasis Achieved: Pressure End Time: 14:08 Procedural Pain: 0 Post Procedural Pain: 0 Response to Treatment: Procedure was tolerated well Post Debridement Measurements of Total Wound Length: (cm) 0.9 Stage: Category/Stage III Width: (cm) 0.5 Depth: (cm) 0.3 Volume: (cm) 0.106 Character of Wound/Ulcer Post Improved Debridement: Severity of Tissue Post Fat layer exposed Debridement: Post Procedure Diagnosis Same as Pre-procedure Electronic Signature(s) Signed:  09/18/2016 12:09:37 AM By: Lawanda Cousins Signed: 09/22/2016 5:02:43 PM By: Alric Quan Previous Signature: 09/18/2016 12:08:37 AM Version By: Lawanda Cousins Previous Signature: 09/17/2016 5:51:11 PM Version By: Aldine Contes, Ervin ([id number]) Entered By: Lawanda Cousins on 09/18/2016 00:09:37 [name] ([id number]) -------------------------------------------------------------------------------- HPI Details Patient Name: [name] Date of Service: 09/17/2016 1:30 PM Medical Record Number: [id number] Patient Account Number: [id number] Date of Birth/Sex: August 18, 1956 (60 y.o. Male) Treating RN: Ahmed Prima Primary Care Physician: [name] Other Clinician: Referring Physician: [name] Treating Physician/Extender: Cathie Olden in Treatment: 23 History of Present Illness HPI Description: 04/08/16; this is a patient we really don't know too much about. He  was hospitalized from 3/24 through 4/6 residing with a sodium of 167 creatinine of 2.49 right lower lobe pneumonia sepsis/septic shock syndrome. He apparently came back to [facility] He apparently had been there only 2 weeks before he went out. I know very little about him premorbidly. The hospital he had a CT scan of the head that showed atrophy and chronic small vessel white matter ischemic changes and remote infarcts also noted within the bilateral basal ganglia. His echocardiogram and presentation showed a EF of 20-25%, left ventricle was moderately dilated with diffuse hypokinesis. Apparently he return to his assisted living with wounds on his bilateral feet. According to the attendant came with him he could walk before he went out he could no longer walk now. There are wounds on both heels covered with a black eschar and also wounds on his bilateral dorsal feet. ABIs calculated in our clinic for 0.49 bilaterally. He was a smoker up to 2  months ago he is not smoking now. 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 Monophasic waves distally bilaterally. He is going back at the end of the month I believe June 29 probably for an arteriogram as arranged by Dr. Delana Meyer in the meantime he has had no real changes 04/30/16; we are waiting vascular review on June 29. The black eschar over his bilateral heels is beginning to separate using Santyl. The areas on the dorsal aspect of his feet looked considerably better/resolved. He apparently has no complaints. He also has a severe presumably ischemic cardiomyopathy as noted above 05/21/16:pt returns today for ongoing evaluation of wounds. I reviewed recent venous and arterial diagnostic findings. He is scheduled for a LLE angio on 05/26/16 and RLE angio on 06/02/16 for advanced evaluation of PAD as noted per lower extremity arterial duplex. BLE venous duplex was negative for venous reflux, DVT or insufficiency. 06/10/16; this is a patient I have not seen in over a month although he was seen here on 7/12. When I admitted him to our clinic I felt he had pressure ulcerations but also potential for limb threatening ischemia. I referred him urgently to vascular surgery. Since we have last seen him in this clinic he has undergone 2 procedures. On 7/17 he will underwent percutaneous transluminal angioplasty of the left above-knee popliteal artery as well as the mid to distal SFA. On 7/27 he underwent for cutaneous transluminal angioplasty of the right popliteal artery and SFA an angioplasty of the proximal SFA. He had a stent placement in the right popliteal artery. The patient is difficult to gauge symptomatology although he does not currently complain of pain. Our intake nurse reports purulent drainage out of the left heel and an  odor. 06/17/2016 -- right heel x-ray and left heel x-ray -- IMPRESSION:No radiographic evidence of osteomyelitis. Soft tissue changes are consistent with cellulitis. He had a wound culture which is positive for abundant Citrobacter fundi and pseudomonas aeruginosa. [name], [name] ([id number]) Both are sensitive to ciprofloxacin and at the present time the patient is on doxycycline 100 mg, prescribed by Dr. Dellia Nims last week. I will prescribe ciprofloxacin for him today. 07/16/16 it is been quite a while since I've seen these wounds however both of them are quite a bit better. Using [place], being followed by [facility]. 07/30/16; I see  this man every 2 weeks. He had ischemic wounds on his bilateral heels. He has been using Santyl with advanced home health coming out to his facility 08/12/16 Patient comes in today for a follow-up visit we see him every 2 weeks at this point in time. He has bilateral heel wounds the we have been applying Santyl to and he has home health coming out to this facility for these dressing changes. These wounds seem to be improving dramatically since he had the procedures with vascular to stent and improve his blood flow in the bilateral lower extremities. Currently he has been responding well to debridement with Dr. Dellia Nims as well. He tells me at this point he really has no sniffing discomfort or pain at this time. He is tolerating the dressing changes well at this point as well. 08/26/16; patient comes in for his two-week follow-up. He has bilateral heel pressure ulcers that were complicated by severe ischemia now corrected. Has been using Santyl to these areas. 09/17/2016 - Mr. Hauter arrives today with an assistance from the facility that he resides at. Mr. Gorrie offers no complaints, answering most questions "no" or "okay"; most information obtained from the assistant. Heel protectors are now only being worn at night, not throughout the day and the patient has a  history of resting heels directly on the ground throughout the day versus on the foot rests of the wheelchair (R>L). Home health continues to provide dressing changes. The caregiver/attendant was encouraged to bring in heel protectors to next weeks visit for evaluation in addition, the patient and the attendant were encouraged to wear to heel protector at all times. Mr [name] remains non-ambulatory Electronic Signature(s) Signed: 09/18/2016 12:18:16 AM By: Lawanda Cousins Entered By: Lawanda Cousins on 09/18/2016 00:18:16 [name] ([id number]) -------------------------------------------------------------------------------- Physical Exam Details Patient Name: [name] Date of Service: 09/17/2016 1:30 PM Medical Record Number: [id number] Patient Account Number: [id number] Date of Birth/Sex: 08-25-1956 (61 y.o. Male) Treating RN: Ahmed Prima Primary Care Physician: [name] Other Clinician: Referring Physician: [name] Treating Physician/Extender: Cathie Olden in Treatment: 23 Constitutional BP within normal limits. afebrile. well nourished; well developed; no acute distress. Respiratory non-labored respiratory effort. Cardiovascular warm extremities; no edema present; no discoloration; cap refill less than or equal to 3 seconds; extremely dry skin to bilateral feet. Musculoskeletal non-ambulatory; wheelchair bound. Integumentary (Hair, Skin) callus formation to bilateral heels, occluding assessment of ulcerations prior to debridement; s/p debridement reveals healthy red granulation tissue throughout wound base. no induration, no fluctuance, denies pain. Psychiatric does not appear to fully comprehend disease process. alert, answers yes/no questions, does not offer conversation. flat affect, calm. Electronic Signature(s) Signed: 09/18/2016 12:23:55 AM By: Lawanda Cousins Entered By: Lawanda Cousins on 09/18/2016 00:23:55 [name]  ([id number]) -------------------------------------------------------------------------------- Physician Orders Details Patient Name: [name] Date of Service: 09/17/2016 1:30 PM Medical Record Number: [id number] Patient Account Number: [id number] Date of Birth/Sex: December 28, 1955 (60 y.o. Male) Treating RN: Ahmed Prima Primary Care Physician: [name] Other Clinician: Referring Physician: [name] Treating Physician/Extender: Cathie Olden in Treatment: 46 Verbal / Phone Orders: Yes Clinician: Carolyne Fiscal, Debi Read Back and Verified: Yes Diagnosis Coding Wound Cleansing Wound #3 Left Calcaneus o Clean wound with Normal Saline. o Cleanse wound with mild soap and water - HHRN please was feet and legs with mild soap and water and dry. Wound #4 Right Calcaneus o Clean wound with Normal Saline. o Cleanse wound with mild soap and water - HHRN please was feet and legs with mild soap  and water and dry. Anesthetic Wound #3 Left Calcaneus o Topical Lidocaine 4% cream applied to wound bed prior to debridement - office use only Wound #4 Right Calcaneus o Topical Lidocaine 4% cream applied to wound bed prior to debridement - office use only Skin Barriers/Peri-Wound Care Wound #3 Left Calcaneus o Moisturizing lotion - Please put lotion on pts legs and feet***do not put on wound or between toes*** Wound #4 Right Calcaneus o Moisturizing lotion - Please put lotion on pts legs and feet***do not put on wound or between toes*** Primary Wound Dressing Wound #3 Left Calcaneus o Prisma Ag - moisten with saline Wound #4 Right Calcaneus o Prisma Ag - moisten with saline Secondary Dressing Wound #3 Left Calcaneus o Dry Gauze o Conform/Kerlix - tape, netting [name], [name] ([id number]) o Foam - to top of foot for protection and heel cups to heels Wound #4 Right Calcaneus o Dry Gauze o Conform/Kerlix - tape, netting o Foam - to top of foot for  protection and heel cups to heels Dressing Change Frequency Wound #3 Left Calcaneus o Change dressing every other day. Wound #4 Right Calcaneus o Change dressing every other day. Follow-up Appointments Wound #3 Left Calcaneus o Return Appointment in 2 weeks. Wound #4 Right Calcaneus o Return Appointment in 2 weeks. Off-Loading Wound #3 Left Calcaneus o Heel suspension boot to: - pt to wear sage boots, please float heels o Turn and reposition every 2 hours o Other: - [place] Lift Wound #4 Right Calcaneus o Heel suspension boot to: - pt to wear sage boots, please float heels o Turn and reposition every 2 hours o Other: - Advertising account planner Orders / Instructions Wound #3 Left Calcaneus o Increase protein intake. Wound #4 Right Calcaneus o Increase protein intake. Home Health Wound #3 Left Calcaneus o Continue Home Health Visits - [place] Nurse may visit PRN to address patientos wound care needs. o FACE TO FACE ENCOUNTER: MEDICARE and MEDICAID PATIENTS: I certify that this patient is under my care and that I had a face-to-face encounter that meets the physician face-to-face [name], [name] ([id number]) encounter requirements with this patient on this date. The encounter with the patient was in whole or in part for the following MEDICAL CONDITION: (primary reason for [place]) MEDICAL NECESSITY: I certify, that based on my findings, NURSING services are a medically necessary home health service. HOME BOUND STATUS: I certify that my clinical findings support that this patient is homebound (i.e., Due to illness or injury, pt requires aid of supportive devices such as crutches, cane, wheelchairs, walkers, the use of special transportation or the assistance of another person to leave their place of residence. There is a normal inability to leave the home and doing so requires considerable and taxing effort. Other absences are  for medical reasons / religious services and are infrequent or of short duration when for other reasons). o If current dressing causes regression in wound condition, may D/C ordered dressing product/s and apply Normal Saline Moist Dressing daily until next [place] / Other MD appointment. [place] of regression in wound condition at [phone]. o Please direct any NON-WOUND related issues/requests for orders to patient's Primary Care Physician Wound #4 Right Calcaneus o [place] Visits - [place] Nurse may visit PRN to address patientos wound care needs. o FACE TO FACE ENCOUNTER: MEDICARE and MEDICAID PATIENTS: I certify that this patient is under my care and that  I had a face-to-face encounter that meets the physician face-to-face encounter requirements with this patient on this date. The encounter with the patient was in whole or in part for the following MEDICAL CONDITION: (primary reason for [place]) MEDICAL NECESSITY: I certify, that based on my findings, NURSING services are a medically necessary home health service. HOME BOUND STATUS: I certify that my clinical findings support that this patient is homebound (i.e., Due to illness or injury, pt requires aid of supportive devices such as crutches, cane, wheelchairs, walkers, the use of special transportation or the assistance of another person to leave their place of residence. There is a normal inability to leave the home and doing so requires considerable and taxing effort. Other absences are for medical reasons / religious services and are infrequent or of short duration when for other reasons). o If current dressing causes regression in wound condition, may D/C ordered dressing product/s and apply Normal Saline Moist Dressing daily until next [place] / Other MD appointment. [place] of regression in wound condition  at [phone]. o Please direct any NON-WOUND related issues/requests for orders to patient's Primary Care Physician Medications-please add to medication list. Wound #3 Left Calcaneus o Other: - Vitamin C, Zinc, Multivitamin Wound #4 Right Calcaneus o Other: - Vitamin C, Zinc, Multivitamin Electronic Signature(s) Signed: 09/17/2016 5:51:11 PM By: Alric Quan Signed: 09/18/2016 2:25:42 AM By: Felizardo Hoffmann, [place] ([id number]) Entered By: Alric Quan on 09/17/2016 14:21:32 [name], [name] ([id number]) -------------------------------------------------------------------------------- Problem List Details Patient Name: [name] Date of Service: 09/17/2016 1:30 PM Medical Record Number: [id number] Patient Account Number: [id number] Date of Birth/Sex: 1956/05/25 (60 y.o. 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 04/08/2016 Yes of other part of foot L89.613 Pressure ulcer of right heel, stage 3 09/18/2016 Yes L89.623 Pressure ulcer of left heel, stage 3 09/18/2016 Yes Inactive Problems Resolved Problems ICD-10 Code Description Active Date Resolved Date L89.610 Pressure ulcer of right heel, unstageable 04/08/2016 04/08/2016 L89.620 Pressure ulcer of left heel, unstageable 04/08/2016 04/08/2016 Electronic Signature(s) Signed: 09/18/2016 12:07:50 AM By: Lawanda Cousins Entered By: Lawanda Cousins on 09/18/2016 00:07:50 [name] ([id number]) -------------------------------------------------------------------------------- Progress Note Details Patient Name: [name] Date of Service: 09/17/2016 1:30 PM Medical Record Number: [id number] Patient Account Number: [id number] Date of Birth/Sex: 1955-12-18 (60 y.o. Male) Treating RN: Ahmed Prima Primary Care Physician: [name] Other Clinician: Referring Physician: [name] Treating Physician/Extender: Cathie Olden in Treatment: 23 Subjective Chief Complaint Information obtained from Patient here for follow up on bilateral heel pressure ulcers History of Present Illness (HPI) 04/08/16; this is a patient we really don't know too much about. He was hospitalized from 3/24 through 4/6 residing with a sodium of 167 creatinine of 2.49 right lower lobe pneumonia sepsis/septic shock syndrome. He apparently came back to [facility] He apparently had been there only 2 weeks before he went out. I know very little about him premorbidly. The hospital he had a CT scan of the head that showed atrophy and chronic small vessel white matter ischemic changes and remote infarcts also noted within the bilateral basal ganglia. His echocardiogram and presentation showed a EF  of 20-25%, left ventricle was moderately dilated with diffuse hypokinesis. Apparently he return to his assisted living with wounds on his bilateral feet. According to the attendant came with him he could walk before he went out he could no longer walk now. There are wounds on both heels covered with a black eschar and also wounds on his bilateral dorsal feet. ABIs calculated in our clinic for 0.49 bilaterally. He was a smoker up to 2 months ago he is not smoking now. 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 Monophasic waves distally bilaterally. He is going back at the end of the month I believe June 29  probably for an arteriogram as arranged by Dr. Delana Meyer in the meantime he has had no real changes 04/30/16; we are waiting vascular review on June 29. The black eschar over his bilateral heels is beginning to separate using Santyl. The areas on the dorsal aspect of his feet looked considerably better/resolved. He apparently has no complaints. He also has a severe presumably ischemic cardiomyopathy as noted above 05/21/16:pt returns today for ongoing evaluation of wounds. I reviewed recent venous and arterial diagnostic findings. He is scheduled for a LLE angio on 05/26/16 and RLE angio on 06/02/16 for advanced evaluation of PAD as noted per lower extremity arterial duplex. BLE venous duplex was negative for venous reflux, DVT or insufficiency. 06/10/16; this is a patient I have not seen in over a month although he was seen here on 7/12. When I admitted him to our clinic I felt he had pressure ulcerations but also potential for limb threatening ischemia. I referred him urgently to vascular surgery. Since we have last seen him in this clinic he has undergone 2 procedures. On 7/17 he will underwent percutaneous transluminal angioplasty of the left above-knee popliteal artery as well as the mid to distal SFA. On 7/27 he underwent for cutaneous transluminal angioplasty of the right popliteal artery and SFA an angioplasty of the proximal SFA. He had a stent placement in the right popliteal artery. The patient is difficult to gauge symptomatology although he does not currently complain of pain. Our intake nurse reports purulent drainage out of the left heel and an odor. [name], [name] ([id number]) 06/17/2016 -- right heel x-ray and left heel x-ray -- IMPRESSION:No radiographic evidence of osteomyelitis. Soft tissue changes are consistent with cellulitis. He had a wound culture which is positive for abundant Citrobacter fundi and pseudomonas aeruginosa. Both are sensitive to ciprofloxacin and at the present  time the patient is on doxycycline 100 mg, prescribed by Dr. Dellia Nims last week. I will prescribe ciprofloxacin for him today. 07/16/16 it is been quite a while since I've seen these wounds however both of them are quite a bit better. Using [place], being followed by [facility]. 07/30/16; I see this man every 2 weeks. He had ischemic wounds on his bilateral heels. He has been using Santyl with advanced home health coming out to his facility 08/12/16 Patient comes in today for a follow-up visit we see him every 2 weeks at this point in time. He has bilateral heel wounds the we have been applying Santyl to and he has home health coming out to this facility for these dressing changes. These wounds seem to be improving dramatically since he had the procedures with vascular to stent and improve his blood flow in  the bilateral lower extremities. Currently he has been responding well to debridement with Dr. Dellia Nims as well. He tells me at this point he really has no sniffing discomfort or pain at this time. He is tolerating the dressing changes well at this point as well. 08/26/16; patient comes in for his two-week follow-up. He has bilateral heel pressure ulcers that were complicated by severe ischemia now corrected. Has been using Santyl to these areas. 09/17/2016 - Mr. Serpe arrives today with an assistance from the facility that he resides at. Mr. Boatner offers no complaints, answering most questions "no" or "okay"; most information obtained from the assistant. Heel protectors are now only being worn at night, not throughout the day and the patient has a history of resting heels directly on the ground throughout the day versus on the foot rests of the wheelchair (R>L). Home health continues to provide dressing changes. The caregiver/attendant was encouraged to bring in heel protectors to next weeks visit for evaluation in addition, the patient and the attendant were encouraged to wear to heel  protector at all times. Mr Pesch remains non-ambulatory Objective Constitutional BP within normal limits. afebrile. well nourished; well developed; no acute distress. Vitals Time Taken: 1:37 PM, Height: 70 in, Temperature: 97.6 F, Pulse: 60 bpm, Respiratory Rate: 16 breaths/min, Blood Pressure: 119/60 mmHg. Respiratory non-labored respiratory effort. Cardiovascular warm extremities; no edema present; no discoloration; cap refill less than or equal to 3 seconds; extremely dry skin to bilateral feet. [name], [name] ([id number]) Musculoskeletal non-ambulatory; wheelchair bound. Psychiatric does not appear to fully comprehend disease process. alert, answers yes/no questions, does not offer conversation. flat affect, calm. Integumentary (Hair, Skin) callus formation to bilateral heels, occluding assessment of ulcerations prior to debridement; s/p debridement reveals healthy red granulation tissue throughout wound base. no induration, no fluctuance, denies pain. Wound #3 status is Open. Original cause of wound was Pressure Injury. The wound is located on the Left Calcaneus. The wound measures 0.8cm length x 3cm width x 0.2cm depth; 1.885cm^2 area and 0.377cm^3 volume. There is fat exposed. There is no tunneling or undermining noted. There is a large amount of serous drainage noted. The wound margin is flat and intact. There is medium (34-66%) red granulation within the wound bed. There is a medium (34-66%) amount of necrotic tissue within the wound bed including Eschar and Adherent Slough. The periwound skin appearance exhibited: Dry/Scaly, Moist. The periwound skin appearance did not exhibit: Callus, Crepitus, Excoriation, Fluctuance, Friable, Induration, Localized Edema, Rash, Scarring, Maceration, Atrophie Blanche, Cyanosis, Ecchymosis, Hemosiderin Staining, Mottled, Pallor, Rubor, Erythema. Periwound temperature was noted as No Abnormality. The periwound has tenderness on  palpation. Wound #4 status is Open. Original cause of wound was Pressure Injury. The wound is located on the Right Calcaneus. The wound measures 0.1cm length x 0.1cm width x 0.1cm depth; 0.008cm^2 area and 0.001cm^3 volume. There is fat exposed. There is no tunneling or undermining noted. There is a medium amount of serous drainage noted. The wound margin is flat and intact. There is no granulation within the wound bed. There is a large (67-100%) amount of necrotic tissue within the wound bed including Eschar. The periwound skin appearance exhibited: Dry/Scaly, Moist. The periwound skin appearance did not exhibit: Callus, Crepitus, Excoriation, Fluctuance, Friable, Induration, Localized Edema, Rash, Scarring, Maceration, Atrophie Blanche, Cyanosis, Ecchymosis, Hemosiderin Staining, Mottled, Pallor, Rubor, Erythema. Periwound temperature was noted as No Abnormality. The periwound has tenderness on palpation. 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 There was a Skin/Subcutaneous Tissue Debridement [id number]) debridement with total area of 2.4 sq cm performed by Lawanda Cousins, NP. with the following instrument(s): Curette to remove Viable and Non-Viable tissue/material including Exudate, Fibrin/Slough, and Subcutaneous after achieving pain control using Lidocaine 4% Topical Solution. A time out was conducted at 13:57, prior to the start of the procedure. A Minimum amount of bleeding was controlled with Pressure. The procedure was tolerated well with a pain level of 0 throughout and a pain level of 0 following the  procedure. Post Debridement Measurements: 0.9cm length x 0.5cm width x 0.3cm depth; 0.106cm^3 volume. Post debridement Stage noted as Category/Stage III. Character of Wound/Ulcer Post Debridement is improved. Severity of Tissue Post Debridement is: Fat layer exposed. Post procedure Diagnosis Wound #3: Same as Pre-Procedure Wound #4 Wound #4 is a Pressure Ulcer located on the Right Calcaneus . There was a Skin/Subcutaneous Tissue Debridement [id number]) debridement with total area of 0.01 sq cm performed by Lawanda Cousins, NP. with the following instrument(s): Curette to remove Viable and Non-Viable tissue/material including Exudate, Fibrin/Slough, and Subcutaneous after achieving pain control using Lidocaine 4% Topical Solution. A time out was conducted at 13:57, prior to the start of the procedure. A Minimum amount of bleeding was controlled with Pressure. The procedure was tolerated well with a pain level of 0 throughout and a pain level of 0 following the procedure. Post Debridement Measurements: 0.5cm length x 2.5cm width x 0.3cm depth; 0.295cm^3 volume. Post debridement Stage noted as Category/Stage III. Character of Wound/Ulcer Post Debridement is improved. Severity of Tissue Post Debridement is: Fat layer exposed. Post procedure Diagnosis Wound #4: Same as Pre-Procedure Plan Wound Cleansing: Wound #3 Left Calcaneus: Clean wound with Normal Saline. Cleanse wound with mild soap and water - HHRN please was feet and legs with mild soap and water and dry. Wound #4 Right Calcaneus: Clean wound with Normal Saline. Cleanse wound with mild soap and water - HHRN please was feet and legs with mild soap and water and [name], [name] ([id number]) dry. Anesthetic: Wound #3 Left Calcaneus: Topical Lidocaine 4% cream applied to wound bed prior to debridement - office use only Wound #4 Right Calcaneus: Topical Lidocaine 4% cream applied to wound bed prior to debridement - office use only Skin  Barriers/Peri-Wound Care: Wound #3 Left Calcaneus: Moisturizing lotion - Please put lotion on pts legs and feet***do not put on wound or between toes*** Wound #4 Right Calcaneus: Moisturizing lotion - Please put lotion on pts legs and feet***do not put on wound or between toes*** Primary Wound Dressing: Wound #3 Left Calcaneus: Prisma Ag - moisten with saline Wound #4 Right Calcaneus: Prisma Ag - moisten with saline Secondary Dressing: Wound #3 Left Calcaneus: Dry Gauze Conform/Kerlix - tape, netting Foam - to top of foot for protection and heel cups to heels Wound #4 Right Calcaneus: Dry Gauze Conform/Kerlix - tape, netting Foam - to top of foot for protection and heel cups to heels Dressing Change Frequency: Wound #3 Left Calcaneus: Change dressing every other day. Wound #4 Right Calcaneus: Change dressing every other day. Follow-up Appointments: Wound #3 Left Calcaneus: Return Appointment in 2 weeks. Wound #4 Right Calcaneus: Return Appointment in  2 weeks. Off-Loading: Wound #3 Left Calcaneus: Heel suspension boot to: - pt to wear sage boots, please float heels Turn and reposition every 2 hours Other: - Hoyer Lift Wound #4 Right Calcaneus: Heel suspension boot to: - pt to wear sage boots, please float heels Turn and reposition every 2 hours Other: - Advertising account planner Orders / Instructions: Wound #3 Left Calcaneus: Increase protein intake. Wound #4 Right Calcaneus: Increase protein intake. Home Health: Wound #3 Left Calcaneus: FAHAD, LADER ([id number]) Continue Home Health Visits - [place] Nurse may visit PRN to address patient s wound care needs. FACE TO FACE ENCOUNTER: MEDICARE and MEDICAID PATIENTS: I certify that this patient is under my care and that I had a face-to-face encounter that meets the physician face-to-face encounter requirements with this patient on this date. The encounter with the patient was in whole or in part for  the following MEDICAL CONDITION: (primary reason for [place]) MEDICAL NECESSITY: I certify, that based on my findings, NURSING services are a medically necessary home health service. HOME BOUND STATUS: I certify that my clinical findings support that this patient is homebound (i.e., Due to illness or injury, pt requires aid of supportive devices such as crutches, cane, wheelchairs, walkers, the use of special transportation or the assistance of another person to leave their place of residence. There is a normal inability to leave the home and doing so requires considerable and taxing effort. Other absences are for medical reasons / religious services and are infrequent or of short duration when for other reasons). If current dressing causes regression in wound condition, may D/C ordered dressing product/s and apply Normal Saline Moist Dressing daily until next [place] / Other MD appointment. [place] of regression in wound condition at [phone]. Please direct any NON-WOUND related issues/requests for orders to patient's Primary Care Physician Wound #4 Right Calcaneus: [place] Nurse may visit PRN to address patient s wound care needs. FACE TO FACE ENCOUNTER: MEDICARE and MEDICAID PATIENTS: I certify that this patient is under my care and that I had a face-to-face encounter that meets the physician face-to-face encounter requirements with this patient on this date. The encounter with the patient was in whole or in part for the following MEDICAL CONDITION: (primary reason for [place]) MEDICAL NECESSITY: I certify, that based on my findings, NURSING services are a medically necessary home health service. HOME BOUND STATUS: I certify that my clinical findings support that this patient is homebound (i.e., Due to illness or injury, pt requires aid of supportive devices such as crutches, cane,  wheelchairs, walkers, the use of special transportation or the assistance of another person to leave their place of residence. There is a normal inability to leave the home and doing so requires considerable and taxing effort. Other absences are for medical reasons / religious services and are infrequent or of short duration when for other reasons). If current dressing causes regression in wound condition, may D/C ordered dressing product/s and apply Normal Saline Moist Dressing daily until next [place] / Other MD appointment. [place] of regression in wound condition at [phone]. Please direct any NON-WOUND related issues/requests for orders to patient's Primary Care Physician Medications-please add to medication list.: Wound #3 Left Calcaneus: Other: - Vitamin C, Zinc, Multivitamin Wound #4 Right Calcaneus: Other: - Vitamin C, Zinc, Multivitamin Follow-Up Appointments: A follow-up appointment should be scheduled. Medication Reconciliation completed  and provided to Patient/Care Provider. A Patient Clinical Summary of Care was provided to [place] ([id number]) Electronic Signature(s) Signed: 09/18/2016 12:24:31 AM By: Lawanda Cousins Entered By: Lawanda Cousins on 09/18/2016 00:24:31 [name] ([id number]) -------------------------------------------------------------------------------- SuperBill Details Patient Name: [name] Date of Service: 09/17/2016 Medical Record Number: [id number] Patient Account Number: [id number] Date of Birth/Sex: 1956-05-04 (61 y.o. Male) Treating RN: Ahmed Prima Primary Care Physician: [name] Other Clinician: Referring Physician: [name] Treating Physician/Extender: Cathie Olden in Treatment: 23 Diagnosis Coding ICD-10 Codes Code Description I70.234 Atherosclerosis of native arteries of right leg with ulceration of heel and midfoot I70.245 Atherosclerosis of native arteries of left  leg with ulceration of other part of foot L89.613 Pressure ulcer of right heel, stage 3 L89.623 Pressure ulcer of left heel, stage 3 Facility Procedures CPT4 Code: [id number] Description: [id number] - DEB SUBQ TISSUE 20 SQ CM/< ICD-10 Description Diagnosis L89.613 Pressure ulcer of right heel, stage 3 L89.623 Pressure ulcer of left heel, stage 3 Modifier: Quantity: 1 Physician Procedures CPT4 Code: [id number] Description: 11042 - WC PHYS SUBQ TISS 20 SQ CM ICD-10 Description Diagnosis L89.613 Pressure ulcer of right heel, stage 3 L89.623 Pressure ulcer of left heel, stage 3 Modifier: Quantity: 1 Electronic Signature(s) Signed: 09/18/2016 12:24:51 AM By: Lawanda Cousins Entered By: Lawanda Cousins on 09/18/2016 00:24:51

## 2016-09-30 ENCOUNTER — Ambulatory Visit: Payer: Medicare Other | Admitting: Nurse Practitioner

## 2016-10-08 ENCOUNTER — Encounter: Payer: Medicare Other | Admitting: Internal Medicine

## 2016-10-08 DIAGNOSIS — I70234 Atherosclerosis of native arteries of right leg with ulceration of heel and midfoot: Secondary | ICD-10-CM | POA: Diagnosis not present

## 2016-10-09 ENCOUNTER — Other Ambulatory Visit
Admission: RE | Admit: 2016-10-09 | Discharge: 2016-10-09 | Disposition: A | Discharge status: Home / Self Care | Payer: Medicare Other | Source: Ambulatory Visit | Attending: Internal Medicine | Admitting: Internal Medicine

## 2016-10-09 DIAGNOSIS — S91302A Unspecified open wound, left foot, initial encounter: Secondary | ICD-10-CM | POA: Diagnosis present

## 2016-10-09 NOTE — Progress Notes (Addendum)
DEIDRICK, FORTES ([id number]) Visit Report for 10/08/2016 Chief Complaint Document Details Patient Name: [name], [name] Date of Service: 10/08/2016 12:45 PM Medical Record Patient Account Number: [id number] Number: Treating RN: Ahmed Prima 03/17/1956 (60 y.o. Other Clinician: Date of Birth/Sex: Male) Treating Orson Rho Primary Care Physician: [name] Physician/Extender: G Referring Physician: Claudie Revering in Treatment: 26 Information Obtained from: Patient Chief Complaint here for follow up on bilateral heel pressure ulcers Electronic Signature(s) Signed: 10/08/2016 4:55:30 PM By: Linton Ham MD Entered By: Linton Ham on 10/08/2016 13:41:11 Disney, Nilesh ([id number]) -------------------------------------------------------------------------------- Debridement Details Patient Name: [name] Date of Service: 10/08/2016 12:45 PM Medical Record Patient Account Number: [id number] Number: Treating RN: Ahmed Prima 08-19-1956 (60 y.o. Other Clinician: Date of Birth/Sex: Male) Treating Sylvio Weatherall, [place] Primary Care Physician: [name] Physician/Extender: G Referring Physician: Claudie Revering in Treatment: 26 Debridement Performed for Wound #3 Left Calcaneus Assessment: Performed By: Physician [name], MD Debridement: Debridement Pre-procedure Yes - 13:29 Verification/Time Out Taken: Start Time: 13:32 Pain Control: Lidocaine 4% Topical Solution Level: Skin/Subcutaneous Tissue Total Area Debrided (L x 0.2 (cm) x 0.3 (cm) = 0.06 (cm) W): Tissue and other Viable, Non-Viable, Exudate, Fibrin/Slough, Subcutaneous material debrided: Instrument: Curette Bleeding: Minimum Hemostasis Achieved: Pressure End Time: 13:35 Procedural Pain: 0 Post Procedural Pain: 0 Response to Treatment: Procedure was tolerated well Post Debridement Measurements of Total Wound Length: (cm) 0.2 Stage: Category/Stage  III Width: (cm) 1 Depth: (cm) 0.4 Volume: (cm) 0.063 Character of Wound/Ulcer Post Requires Further Debridement: Debridement Severity of Tissue Post Fat layer exposed Debridement: Post Procedure Diagnosis Same as Pre-procedure Electronic Signature(s) Signed: 10/08/2016 4:47:20 PM By: Aldine Contes, [name] ([id number]) Signed: 10/08/2016 4:55:30 PM By: Linton Ham MD Entered By: Linton Ham on 10/08/2016 13:40:27 Champagne, [name] ([id number]) -------------------------------------------------------------------------------- Debridement Details Patient Name: [name] Date of Service: 10/08/2016 12:45 PM Medical Record Patient Account Number: [id number] Number: Treating RN: Ahmed Prima Jan 02, 1956 (60 y.o. Other Clinician: Date of Birth/Sex: Male) Treating Khaleem Burchill, [place] Primary Care Physician: [name] Physician/Extender: G Referring Physician: Claudie Revering in Treatment: 26 Debridement Performed for Wound #4 Right Calcaneus Assessment: Performed By: Physician [name], MD Debridement: Debridement Pre-procedure Yes - 13:29 Verification/Time Out Taken: Start Time: 13:30 Pain Control: Lidocaine 4% Topical Solution Level: Skin/Subcutaneous Tissue Total Area Debrided (L x 0.1 (cm) x 0.1 (cm) = 0.01 (cm) W): Tissue and other Viable, Non-Viable, Exudate, Fibrin/Slough, Subcutaneous material debrided: Instrument: Curette Bleeding: Minimum Hemostasis Achieved: Pressure End Time: 13:32 Procedural Pain: 0 Post Procedural Pain: 0 Response to Treatment: Procedure was tolerated well Post Debridement Measurements of Total Wound Length: (cm) 0.4 Stage: Category/Stage III Width: (cm) 1 Depth: (cm) 1 Volume: (cm) 0.314 Character of Wound/Ulcer Post Requires Further Debridement: Debridement Severity of Tissue Post Fat layer exposed Debridement: Post Procedure Diagnosis Same as Pre-procedure Electronic  Signature(s) Signed: 10/08/2016 4:47:20 PM By: Aldine Contes, [name] ([id number]) Signed: 10/08/2016 4:55:30 PM By: Linton Ham MD Entered By: Linton Ham on 10/08/2016 13:40:41 Vesely, Arliss ([id number]) -------------------------------------------------------------------------------- HPI Details Patient Name: [name] Date of Service: 10/08/2016 12:45 PM Medical Record Patient Account Number: [id number] Number: Treating RN: Ahmed Prima 11-15-1955 (60 y.o. Other Clinician: Date of Birth/Sex: Male) Treating Vignesh Willert Primary Care Physician: [name] Physician/Extender: G Referring Physician: Claudie Revering in Treatment: 26 History of Present Illness HPI Description: 04/08/16; this is a patient we really don't know too much about. He was hospitalized from 3/24 through 4/6 residing with a sodium of 167 creatinine of  2.49 right lower lobe pneumonia sepsis/septic shock syndrome. He apparently came back to the assisted living [facility] living [facility]. He apparently had been there only 2 weeks before he went out. I know very little about him premorbidly. The hospital he had a CT scan of the head that showed atrophy and chronic small vessel white matter ischemic changes and remote infarcts also noted within the bilateral basal ganglia. His echocardiogram and presentation showed a EF of 20-25%, left ventricle was moderately dilated with diffuse hypokinesis. Apparently he return to his assisted living with wounds on his bilateral feet. According to the attendant came with him he could walk before he went out he could no longer walk now. There are wounds on both heels covered with a black eschar and also wounds on his bilateral dorsal feet. ABIs calculated in our clinic for 0.49 bilaterally. He was a smoker up to 2 months ago he is not smoking now. 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 Monophasic waves distally bilaterally. He is going back at the end of the month I believe June 29 probably for an arteriogram as arranged by Dr. Delana Meyer in the meantime he has had no real changes 04/30/16; we are waiting vascular review on June 29. The black eschar over his bilateral heels is beginning to separate using Santyl. The areas on the dorsal aspect of his feet looked considerably better/resolved. He apparently has no complaints. He also has a severe presumably ischemic cardiomyopathy as noted above 05/21/16:pt returns today for ongoing evaluation of wounds. I reviewed recent venous and arterial diagnostic findings. He is scheduled for a LLE angio on 05/26/16 and RLE angio on 06/02/16 for advanced evaluation of PAD as noted per lower extremity arterial duplex. BLE venous duplex was negative for venous reflux, DVT or insufficiency. 06/10/16; this is a patient I have not seen in over a month although he was seen here on 7/12. When I admitted him to our clinic I felt he had pressure ulcerations but also potential for limb threatening ischemia. I referred him urgently to vascular surgery. Since we have last seen him in this clinic he has undergone 2 procedures. On 7/17 he will underwent percutaneous transluminal angioplasty of the left above-knee popliteal artery as well as the mid to distal SFA. On 7/27 he underwent for cutaneous transluminal angioplasty of the right popliteal artery and SFA an angioplasty of the proximal SFA. He had a stent placement in the right popliteal artery. The patient is difficult to gauge symptomatology although he does not currently complain of pain. Our intake nurse reports purulent drainage out of the left heel and an odor. 06/17/2016 -- right heel x-ray and left heel x-ray -- IMPRESSION:No radiographic evidence of  osteomyelitis. Soft tissue changes are Kamiya, Franchot ([id number]) consistent with cellulitis. He had a wound culture which is positive for abundant Citrobacter fundi and pseudomonas aeruginosa. Both are sensitive to ciprofloxacin and at the present time the patient is on doxycycline 100 mg, prescribed by Dr. Dellia Nims last week. I will prescribe ciprofloxacin for him today. 07/16/16 it is been quite a while since I've seen these wounds however both of them are quite a bit better. Using [place], being followed by [facility]. 07/30/16; I see this man every 2 weeks. He had ischemic wounds on his bilateral heels. He  has been using Santyl with [facility] coming out to his facility 08/12/16 Patient comes in today for a follow-up visit we see him every 2 weeks at this point in time. He has bilateral heel wounds the we have been applying Santyl to and he has home health coming out to this facility for these dressing changes. These wounds seem to be improving dramatically since he had the procedures with vascular to stent and improve his blood flow in the bilateral lower extremities. Currently he has been responding well to debridement with Dr. Dellia Nims as well. He tells me at this point he really has no sniffing discomfort or pain at this time. He is tolerating the dressing changes well at this point as well. 08/26/16; patient comes in for his two-week follow-up. He has bilateral heel pressure ulcers that were complicated by severe ischemia now corrected. Has been using Santyl to these areas. 09/17/2016 - Mr. Jarosh arrives today with an assistance from the facility that he resides at. Mr. Bednarczyk offers no complaints, answering most questions "no" or "okay"; most information obtained from the assistant. Heel protectors are now only being worn at night, not throughout the day and the patient has a history of resting heels directly on the ground throughout the day versus on the foot rests of the  wheelchair (R>L). Home health continues to provide dressing changes. The caregiver/attendant was encouraged to bring in heel protectors to next weeks visit for evaluation in addition, the patient and the attendant were encouraged to wear to heel protector at all times. Mr Deveney remains non-ambulatory 10/08/16; patient arrives today in 3 week follow-up. When he was last seen here our staff and providers felt that he was well on his way to healing. He has home health changing the dressings. He arrives today with an odor coming out of the left heel that smelt like "rotten potatoes" according to our intake nurse. He is not been systemically unwell. The individual who accompanies him from the facility states that he uses his body boots at night but sits in the wheelchair rubbing his heal up and down on the dorsal aspect of the other foot. Electronic Signature(s) Signed: 10/08/2016 4:55:30 PM By: Linton Ham MD Entered By: Linton Ham on 10/08/2016 13:43:16 [name] ([id number]) -------------------------------------------------------------------------------- Physical Exam Details Patient Name: [name] Date of Service: 10/08/2016 12:45 PM Medical Record Patient Account Number: [id number] Number: Treating RN: Ahmed Prima 07/18/1956 (59 y.o. Other Clinician: Date of Birth/Sex: Male) Treating Ricci Dirocco Primary Care Physician: [name] Physician/Extender: G Referring Physician: Claudie Revering in Treatment: 26 Constitutional Patient is hypotensive.. Pulse regular and within target range for patient.Marland Kitchen Respirations regular, non-labored and within target range.. Temperature is normal and within the target range for the patient.. Patient's appearance is neat and clean. Appears in no acute distress. Well nourished and well developed.. Notes Wound exam; the patient has 2 linear areas on the tip of both heels these have depth and some undermining. The  area on the left had purulent drainage which she was obtained by our intake nurse for culture. There is soft tissue tenderness around the left heel area. Using a #3 curette I removed the callus on the right heel to reveal a second open area looking much the same as on the left side. The left sided area was also debrided with a #3 curette to remove nonviable subcutaneous tissue Electronic Signature(s) Signed: 10/08/2016 4:55:30 PM By: Linton Ham MD Entered By: Linton Ham on 10/08/2016 13:45:09 Kopecky,  [name] ([id number]) -------------------------------------------------------------------------------- Physician Orders Details Patient Name: DANYELLE, DITTA Date of Service: 10/08/2016 12:45 PM Medical Record Patient Account Number: [id number] Number: Treating RN: Ahmed Prima 1956-02-17 (59 y.o. Other Clinician: Date of Birth/Sex: Male) Treating Raequon Catanzaro Primary Care Physician: [name] Physician/Extender: G Referring Physician: Claudie Revering in Treatment: 24 Verbal / Phone Orders: Yes Clinician: Pinkerton, Debi Read Back and Verified: Yes Diagnosis Coding Wound Cleansing Wound #3 Left Calcaneus o Clean wound with Normal Saline. o Cleanse wound with mild soap and water - HHRN please was feet and legs with mild soap and water and dry. Wound #4 Right Calcaneus o Clean wound with Normal Saline. o Cleanse wound with mild soap and water - HHRN please was feet and legs with mild soap and water and dry. Anesthetic Wound #3 Left Calcaneus o Topical Lidocaine 4% cream applied to wound bed prior to debridement - office use only Wound #4 Right Calcaneus o Topical Lidocaine 4% cream applied to wound bed prior to debridement - office use only Skin Barriers/Peri-Wound Care Wound #3 Left Calcaneus o Moisturizing lotion - Please put lotion on pts legs and feet***do not put on wound or between toes*** Wound #4 Right Calcaneus o  Moisturizing lotion - Please put lotion on pts legs and feet***do not put on wound or between toes*** Primary Wound Dressing Wound #3 Left Calcaneus o Aquacel Ag - rope--pack lightly into wound and into undermining Wound #4 Right Calcaneus o Aquacel Ag - rope--pack lightly into wound and into undermining Wound #5 Right,Dorsal Foot o Aquacel Ag Elbert, [place] ([id number]) Wound #6 Left,Dorsal Foot o Aquacel Ag Secondary Dressing Wound #3 Left Calcaneus o Dry Gauze o Conform/Kerlix - tape, netting o Foam - heel cups to heels Wound #4 Right Calcaneus o Dry Gauze o Conform/Kerlix - tape, netting o Foam - heel cups to heels Wound #5 Right,Dorsal Foot o ABD pad o Conform/Kerlix - tape, netting Wound #6 Left,Dorsal Foot o ABD pad o Conform/Kerlix - tape, netting Dressing Change Frequency Wound #3 Left Calcaneus o Change dressing every other day. Wound #4 Right Calcaneus o Change dressing every other day. Follow-up Appointments Wound #3 Left Calcaneus o Return Appointment in 1 week. Wound #4 Right Calcaneus o Return Appointment in 1 week. Wound #5 Right,Dorsal Foot o Return Appointment in 1 week. Wound #6 Left,Dorsal Foot o Return Appointment in 1 week. Off-Loading Wound #3 Left Calcaneus o Heel suspension boot to: - pt to wear sage boots, please float heels o Turn and reposition every 2 hours Benner, [place] ([id number]) o Other: - Hoyer Lift Wound #4 Right Calcaneus o Heel suspension boot to: - pt to wear sage boots, please float heels o Turn and reposition every 2 hours o Other: - Hoyer Lift Wound #5 Right,Dorsal Foot o Heel suspension boot to: - pt to wear sage boots, please float heels o Turn and reposition every 2 hours o Other: - Hoyer Lift Wound #6 Left,Dorsal Foot o Heel suspension boot to: - pt to wear sage boots, please float heels o Turn and reposition every 2 hours o Other: Audiological scientist Orders / Instructions Wound #3 Left Calcaneus o Increase protein intake. Wound #4 Right Calcaneus o Increase protein intake. Wound #5 Right,Dorsal Foot o Increase protein intake. Wound #6 Left,Dorsal Foot o Increase protein intake. Home Health Wound #3 Left Calcaneus o Continue Home Health Visits - [place] Nurse may visit PRN to address patientos wound care needs. o FACE TO FACE ENCOUNTER:  MEDICARE and MEDICAID PATIENTS: I certify that this patient is under my care and that I had a face-to-face encounter that meets the physician face-to-face encounter requirements with this patient on this date. The encounter with the patient was in whole or in part for the following MEDICAL CONDITION: (primary reason for [place]) MEDICAL NECESSITY: I certify, that based on my findings, NURSING services are a medically necessary home health service. HOME BOUND STATUS: I certify that my clinical findings support that this patient is homebound (i.e., Due to illness or injury, pt requires aid of supportive devices such as crutches, cane, wheelchairs, walkers, the use of special transportation or the assistance of another person to leave their place of residence. There is a normal inability to leave the home and doing so requires considerable and taxing effort. Other absences are for medical reasons / religious services and are infrequent or of short duration when for other reasons). o If current dressing causes regression in wound condition, may D/C ordered dressing product/s and apply Normal Saline Moist Dressing daily until next [place] / Other MD appointment. [place] of regression in wound condition at [phone]. ALTO, MESMER ([id number]) o Please direct any NON-WOUND related issues/requests for orders to patient's Primary Care Physician Wound #4 Right Calcaneus o [place] Visits -  [place] Nurse may visit PRN to address patientos wound care needs. o FACE TO FACE ENCOUNTER: MEDICARE and MEDICAID PATIENTS: I certify that this patient is under my care and that I had a face-to-face encounter that meets the physician face-to-face encounter requirements with this patient on this date. The encounter with the patient was in whole or in part for the following MEDICAL CONDITION: (primary reason for [place]) MEDICAL NECESSITY: I certify, that based on my findings, NURSING services are a medically necessary home health service. HOME BOUND STATUS: I certify that my clinical findings support that this patient is homebound (i.e., Due to illness or injury, pt requires aid of supportive devices such as crutches, cane, wheelchairs, walkers, the use of special transportation or the assistance of another person to leave their place of residence. There is a normal inability to leave the home and doing so requires considerable and taxing effort. Other absences are for medical reasons / religious services and are infrequent or of short duration when for other reasons). o If current dressing causes regression in wound condition, may D/C ordered dressing product/s and apply Normal Saline Moist Dressing daily until next [place] / Other MD appointment. [place] of regression in wound condition at [phone]. o Please direct any NON-WOUND related issues/requests for orders to patient's Primary Care Physician Wound #5 [place] Visits - [place] Nurse may visit PRN to address patientos wound care needs. o FACE TO FACE ENCOUNTER: MEDICARE and MEDICAID PATIENTS: I certify that this patient is under my care and that I had a face-to-face encounter that meets the physician face-to-face encounter requirements with this patient on this date. The encounter with the patient  was in whole or in part for the following MEDICAL CONDITION: (primary reason for [place]) MEDICAL NECESSITY: I certify, that based on my findings, NURSING services are a medically necessary home health service. HOME BOUND STATUS: I certify that my clinical findings support that this patient is homebound (i.e., Due to illness or injury, pt requires aid of supportive devices such as crutches, cane, wheelchairs, walkers,  the use of special transportation or the assistance of another person to leave their place of residence. There is a normal inability to leave the home and doing so requires considerable and taxing effort. Other absences are for medical reasons / religious services and are infrequent or of short duration when for other reasons). o If current dressing causes regression in wound condition, may D/C ordered dressing product/s and apply Normal Saline Moist Dressing daily until next [place] / Other MD appointment. [place] of regression in wound condition at [phone]. o Please direct any NON-WOUND related issues/requests for orders to patient's Primary Care Physician Wound #6 [place] Visits - [place] Nurse may visit PRN to address patientos wound care needs. o FACE TO FACE ENCOUNTER: MEDICARE and MEDICAID PATIENTS: I certify that this patient is under my care and that I had a face-to-face encounter that meets the physician face-to-face BATHURST, [place] ([id number]) encounter requirements with this patient on this date. The encounter with the patient was in whole or in part for the following MEDICAL CONDITION: (primary reason for [place]) MEDICAL NECESSITY: I certify, that based on my findings, NURSING services are a medically necessary home health service. HOME BOUND STATUS: I certify that my clinical findings support that this patient is homebound (i.e., Due to illness  or injury, pt requires aid of supportive devices such as crutches, cane, wheelchairs, walkers, the use of special transportation or the assistance of another person to leave their place of residence. There is a normal inability to leave the home and doing so requires considerable and taxing effort. Other absences are for medical reasons / religious services and are infrequent or of short duration when for other reasons). o If current dressing causes regression in wound condition, may D/C ordered dressing product/s and apply Normal Saline Moist Dressing daily until next [place] / Other MD appointment. [place] of regression in wound condition at [phone]. o Please direct any NON-WOUND related issues/requests for orders to patient's Primary Care Physician Medications-please add to medication list. Wound #3 Left Calcaneus o P.O. Antibiotics - Cipro o Other: - Vitamin C, Zinc, Multivitamin Wound #4 Right Calcaneus o Other: - Vitamin C, Zinc, Multivitamin Wound #5 Right,Dorsal Foot o Other: - Vitamin C, Zinc, Multivitamin Wound #6 Left,Dorsal Foot o Other: - Vitamin C, Zinc, Multivitamin Laboratory o Bacteria identified in Wound by Culture (MICRO) - left calcaneus oooo LOINC Code: [id number] oooo Convenience Name: Wound culture routine Electronic Signature(s) Signed: 10/08/2016 4:47:20 PM By: Alric Quan Signed: 10/08/2016 4:55:30 PM By: Linton Ham MD Entered By: Alric Quan on 10/08/2016 15:17:11 JHALIL, RINGSTAFF ([id number]) -------------------------------------------------------------------------------- Prescription 10/08/2016 Patient Name: [name] Physician: [name] MD Date of Birth: [birth year] NPI#: [id number] Sex: M DEA#: [id number] Phone #: [phone] License #: [id number] Patient Address: [place], [place] [address] [facility] [address] [place], [place] [address] [phone] Allergies NKDA 47 Orders P.O. Antibiotics - Cipro Signature(s): Date(s): Electronic Signature(s) Signed: 10/08/2016 4:47:20 PM By: Alric Quan Signed: 10/08/2016 4:55:30 PM By: Linton Ham MD Entered By: Alric Quan on 10/08/2016 15:17:13 [name] ([id number]) --------------------------------------------------------------------------------  Problem List Details Patient Name: [name] Date of Service: 10/08/2016 12:45 PM Medical Record Patient Account Number: [id number] Number: Treating RN: Ahmed Prima 22-Oct-1956 (59 y.o. 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 04/08/2016 Yes of other part of foot L89.613 Pressure ulcer of right heel, stage 3 09/18/2016 Yes L89.623 Pressure ulcer of left heel, stage 3 09/18/2016 Yes Inactive Problems Resolved Problems ICD-10 Code Description Active Date Resolved Date L89.610 Pressure ulcer of right heel, unstageable 04/08/2016 04/08/2016 L89.620 Pressure ulcer of left heel, unstageable 04/08/2016 04/08/2016 Electronic Signature(s) Signed: 10/08/2016 4:55:30 PM By: Linton Ham MD Entered By: Linton Ham on 10/08/2016 13:40:04 Hofmann, Samil ([id number]) [name] ([id number]) -------------------------------------------------------------------------------- Progress Note Details Patient Name: [name] Date of Service: 10/08/2016 12:45 PM Medical Record Patient Account Number: [id number] Number: Treating  RN: Ahmed Prima [birth year] (59 y.o. Other Clinician: Date of Birth/Sex: Male) Treating Dhani Dannemiller Primary Care Physician: [name] Physician/Extender: G Referring Physician: Claudie Revering in Treatment: 26 Subjective Chief Complaint Information obtained from Patient here for follow up on bilateral heel pressure ulcers History of Present Illness (HPI) 04/08/16; this is a patient we really don't know too much about. He was hospitalized from 3/24 through 4/6 residing with a sodium of 167 creatinine of 2.49 right lower lobe pneumonia sepsis/septic shock syndrome. He apparently came back to the assisted living [facility] living [facility]. He apparently had been there only 2 weeks before he went out. I know very little about him premorbidly. The hospital he had a CT scan of the head that showed atrophy and chronic small vessel white matter ischemic changes and remote infarcts also noted within the bilateral basal ganglia. His echocardiogram and presentation showed a EF of 20-25%, left ventricle was moderately dilated with diffuse hypokinesis. Apparently he return to his assisted living with wounds on his bilateral feet. According to the attendant came with him he could walk before he went out he could no longer walk now. There are wounds on both heels covered with a black eschar and also wounds on his bilateral dorsal feet. ABIs calculated in our clinic for 0.49 bilaterally. He was a smoker up to 2 months ago he is not smoking now. 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 Monophasic waves distally bilaterally. He is going back at the end of the month I believe June 29 probably for an arteriogram as arranged by Dr. Delana Meyer in the meantime he has had no real changes 04/30/16; we are  waiting vascular review on June 29. The black eschar over his bilateral heels is beginning to separate using Santyl. The areas on the dorsal aspect of his feet looked considerably better/resolved. He apparently has no complaints. He also has a severe presumably ischemic cardiomyopathy as noted above 05/21/16:pt returns today for ongoing evaluation of wounds. I reviewed recent venous and arterial diagnostic findings. He is scheduled for a LLE angio on 05/26/16 and RLE angio on 06/02/16 for advanced evaluation of PAD as noted per lower extremity arterial duplex. BLE venous duplex was negative for venous reflux, DVT or insufficiency. 06/10/16; this is a patient I have not seen in over a month although he was seen here on 7/12. When I admitted him to our clinic I felt he had pressure ulcerations but also  potential for limb threatening ischemia. I referred him urgently to vascular surgery. Since we have last seen him in this clinic he has undergone 2 procedures. On 7/17 he will underwent percutaneous transluminal angioplasty of the left above-knee popliteal artery as well as the mid to distal SFA. On 7/27 he underwent for cutaneous transluminal angioplasty of the right popliteal artery and SFA an angioplasty of the proximal SFA. He had a stent Offenberger, Amiri ([id number]) placement in the right popliteal artery. The patient is difficult to gauge symptomatology although he does not currently complain of pain. Our intake nurse reports purulent drainage out of the left heel and an odor. 06/17/2016 -- right heel x-ray and left heel x-ray -- IMPRESSION:No radiographic evidence of osteomyelitis. Soft tissue changes are consistent with cellulitis. He had a wound culture which is positive for abundant Citrobacter fundi and pseudomonas aeruginosa. Both are sensitive to ciprofloxacin and at the present time the patient is on doxycycline 100 mg, prescribed by Dr. Dellia Nims last week. I will prescribe ciprofloxacin for  him today. 07/16/16 it is been quite a while since I've seen these wounds however both of them are quite a bit better. Using [place], being followed by [facility]. 07/30/16; I see this man every 2 weeks. He had ischemic wounds on his bilateral heels. He has been using Santyl with [facility] coming out to his facility 08/12/16 Patient comes in today for a follow-up visit we see him every 2 weeks at this point in time. He has bilateral heel wounds the we have been applying Santyl to and he has home health coming out to this facility for these dressing changes. These wounds seem to be improving dramatically since he had the procedures with vascular to stent and improve his blood flow in the bilateral lower extremities. Currently he has been responding well to debridement with Dr. Dellia Nims as well. He tells me at this point he really has no sniffing discomfort or pain at this time. He is tolerating the dressing changes well at this point as well. 08/26/16; patient comes in for his two-week follow-up. He has bilateral heel pressure ulcers that were complicated by severe ischemia now corrected. Has been using Santyl to these areas. 09/17/2016 - Mr. Ocasio arrives today with an assistance from the facility that he resides at. Mr. Maish offers no complaints, answering most questions "no" or "okay"; most information obtained from the assistant. Heel protectors are now only being worn at night, not throughout the day and the patient has a history of resting heels directly on the ground throughout the day versus on the foot rests of the wheelchair (R>L). Home health continues to provide dressing changes. The caregiver/attendant was encouraged to bring in heel protectors to next weeks visit for evaluation in addition, the patient and the attendant were encouraged to wear to heel protector at all times. Mr Nitzsche remains non-ambulatory 10/08/16; patient arrives today in 3 week follow-up. When he was  last seen here our staff and providers felt that he was well on his way to healing. He has home health changing the dressings. He arrives today with an odor coming out of the left heel that smelt like "rotten potatoes" according to our intake nurse. He is not been systemically unwell. The individual who accompanies him from the facility states that he uses his body boots at night but sits in the wheelchair rubbing his heal up and down on the dorsal aspect of the other foot. Objective Constitutional Patient is hypotensive.Marland Kitchen  Pulse regular and within target range for patient.Marland Kitchen Respirations regular, non-labored and within target range.. Temperature is normal and within the target range for the patient.. Patient's appearance is neat and clean. Appears in no acute distress. Well nourished and well developed.. Vitals Time Taken: 1:05 PM, Height: 70 in, Temperature: 97.6 F, Respiratory Rate: 16 breaths/min, Blood Pry, Castin ([id number]) Pressure: 98/61 mmHg. General Notes: Wound exam; the patient has 2 linear areas on the tip of both heels these have depth and some undermining. The area on the left had purulent drainage which she was obtained by our intake nurse for culture. There is soft tissue tenderness around the left heel area. Using a #3 curette I removed the callus on the right heel to reveal a second open area looking much the same as on the left side. The left sided area was also debrided with a #3 curette to remove nonviable subcutaneous tissue Integumentary (Hair, Skin) Wound #3 status is Open. Original cause of wound was Pressure Injury. The wound is located on the Left Calcaneus. The wound measures 0.2cm length x 0.3cm width x 0.2cm depth; 0.047cm^2 area and 0.009cm^3 volume. There is fat exposed. There is no tunneling or undermining noted. There is a large amount of serous drainage noted. The wound margin is flat and intact. There is no granulation within the wound bed. There is a  large (67-100%) amount of necrotic tissue within the wound bed including Eschar and Adherent Slough. The periwound skin appearance exhibited: Dry/Scaly, Moist. The periwound skin appearance did not exhibit: Callus, Crepitus, Excoriation, Fluctuance, Friable, Induration, Localized Edema, Rash, Scarring, Maceration, Atrophie Blanche, Cyanosis, Ecchymosis, Hemosiderin Staining, Mottled, Pallor, Rubor, Erythema. Periwound temperature was noted as No Abnormality. The periwound has tenderness on palpation. Wound #4 status is Open. Original cause of wound was Pressure Injury. The wound is located on the Right Calcaneus. The wound measures 0.1cm length x 0.1cm width x 0.1cm depth; 0.008cm^2 area and 0.001cm^3 volume. There is fat exposed. There is no tunneling or undermining noted. There is a medium amount of serosanguineous drainage noted. The wound margin is flat and intact. There is no granulation within the wound bed. There is a large (67-100%) amount of necrotic tissue within the wound bed including Eschar. The periwound skin appearance exhibited: Dry/Scaly, Moist. The periwound skin appearance did not exhibit: Callus, Crepitus, Excoriation, Fluctuance, Friable, Induration, Localized Edema, Rash, Scarring, Maceration, Atrophie Blanche, Cyanosis, Ecchymosis, Hemosiderin Staining, Mottled, Pallor, Rubor, Erythema. Periwound temperature was noted as No Abnormality. The periwound has tenderness on palpation. Wound #5 status is Open. Original cause of wound was Shear/Friction. The wound is located on the Right,Dorsal Foot. The wound measures 8.2cm length x 4.5cm width x 0.1cm depth; 28.981cm^2 area and 2.898cm^3 volume. The wound is limited to skin breakdown. There is no tunneling or undermining noted. There is a medium amount of serosanguineous drainage noted. The wound margin is distinct with the outline attached to the wound base. There is large (67-100%) pink granulation within the wound bed.  There is a small (1-33%) amount of necrotic tissue within the wound bed including Adherent Slough. The periwound skin appearance exhibited: Maceration, Moist. Periwound temperature was noted as No Abnormality. The periwound has tenderness on palpation. Wound #6 status is Open. Original cause of wound was Shear/Friction. The wound is located on the Left,Dorsal Foot. The wound measures 1.5cm length x 1.4cm width x 0.1cm depth; 1.649cm^2 area and 0.165cm^3 volume. The wound is limited to skin breakdown. There is no tunneling or undermining noted. There  is a medium amount of serosanguineous drainage noted. The wound margin is distinct with the outline attached to the wound base. There is large (67-100%) pink granulation within the wound bed. There is a small (1-33%) amount of necrotic tissue within the wound bed including Adherent Slough. The periwound skin appearance exhibited: Moist. Periwound temperature was noted as No Abnormality. The periwound has tenderness on palpation. 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 There was a Skin/Subcutaneous Tissue Debridement [id number]) debridement with total area of 0.06 sq cm performed by [name], MD. with the following instrument(s): Curette to remove Viable and Non-Viable tissue/material including Exudate, Fibrin/Slough, and Subcutaneous after achieving pain control using Lidocaine 4% Topical Solution. A time out was conducted at 13:29, prior to the start of the procedure. A Minimum amount of bleeding was controlled with Pressure. The procedure was tolerated  well with a pain level of 0 throughout and a pain level of 0 following the procedure. Post Debridement Measurements: 0.2cm length x 1cm width x 0.4cm depth; 0.063cm^3 volume. Post debridement Stage noted as Category/Stage III. Character of Wound/Ulcer Post Debridement requires further debridement. Severity of Tissue Post Debridement is: Fat layer exposed. Post procedure Diagnosis Wound #3: Same as Pre-Procedure Wound #4 Wound #4 is a Pressure Ulcer located on the Right Calcaneus . There was a Skin/Subcutaneous Tissue Debridement [id number]) debridement with total area of 0.01 sq cm performed by [name], MD. with the following instrument(s): Curette to remove Viable and Non-Viable tissue/material including Exudate, Fibrin/Slough, and Subcutaneous after achieving pain control using Lidocaine 4% Topical Solution. A time out was conducted at 13:29, prior to the start of the procedure. A Minimum amount of bleeding was controlled with Pressure. The procedure was tolerated well with a pain level of 0 throughout and a pain level of 0 following the procedure. Post Debridement Measurements: 0.4cm length x 1cm width x 1cm depth; 0.314cm^3 volume. Post debridement Stage noted as Category/Stage III. Character of Wound/Ulcer Post Debridement requires further debridement. Severity of Tissue Post Debridement is: Fat layer exposed. Post procedure Diagnosis Wound #4: Same as Pre-Procedure Benavides, [place] ([id number]) Plan Wound Cleansing: Wound #3 Left Calcaneus: Clean wound with Normal Saline. Cleanse wound with mild soap and water - HHRN please was feet and legs with mild soap and water and dry. Wound #4 Right Calcaneus: Clean wound with Normal Saline. Cleanse wound with mild soap and water - HHRN please was feet and legs with mild soap and water and dry. Anesthetic: Wound #3 Left Calcaneus: Topical Lidocaine 4% cream applied to wound bed prior to debridement - office use only Wound #4 Right  Calcaneus: Topical Lidocaine 4% cream applied to wound bed prior to debridement - office use only Skin Barriers/Peri-Wound Care: Wound #3 Left Calcaneus: Moisturizing lotion - Please put lotion on pts legs and feet***do not put on wound or between toes*** Wound #4 Right Calcaneus: Moisturizing lotion - Please put lotion on pts legs and feet***do not put on wound or between toes*** Primary Wound Dressing: Wound #3 Left Calcaneus: Aquacel Ag - rope--pack lightly into wound and into undermining Wound #4 Right Calcaneus: Aquacel Ag - rope--pack  lightly into wound and into undermining Wound #5 Right,Dorsal Foot: Aquacel Ag Wound #6 Left,Dorsal Foot: Aquacel Ag Secondary Dressing: Wound #3 Left Calcaneus: Dry Gauze Conform/Kerlix - tape, netting Foam - heel cups to heels Wound #4 Right Calcaneus: Dry Gauze Conform/Kerlix - tape, netting Foam - heel cups to heels Wound #5 Right,Dorsal Foot: ABD pad Conform/Kerlix - tape, netting Wound #6 Left,Dorsal Foot: ABD pad Conform/Kerlix - tape, netting Dressing Change Frequency: Wound #3 Left Calcaneus: Change dressing every other day. Wound #4 Right Calcaneus: Manger, Herbert ([id number]) Change dressing every other day. Follow-up Appointments: Wound #3 Left Calcaneus: Return Appointment in 1 week. Wound #4 Right Calcaneus: Return Appointment in 1 week. Wound #5 Right,Dorsal Foot: Return Appointment in 1 week. Wound #6 Left,Dorsal Foot: Return Appointment in 1 week. Off-Loading: Wound #3 Left Calcaneus: Heel suspension boot to: - pt to wear sage boots, please float heels Turn and reposition every 2 hours Other: - Hoyer Lift Wound #4 Right Calcaneus: Heel suspension boot to: - pt to wear sage boots, please float heels Turn and reposition every 2 hours Other: - [place]oyer Lift Wound #5 Right,Dorsal Foot: Heel suspension boot to: - pt to wear sage boots, please float heels Turn and reposition every 2 hours Other: - Hoyer Lift Wound  #6 Left,Dorsal Foot: Heel suspension boot to: - pt to wear sage boots, please float heels Turn and reposition every 2 hours Other: - Advertising account planner Orders / Instructions: Wound #3 Left Calcaneus: Increase protein intake. Wound #4 Right Calcaneus: Increase protein intake. Wound #5 Right,Dorsal Foot: Increase protein intake. Wound #6 Left,Dorsal Foot: Increase protein intake. Home Health: Wound #3 Left Calcaneus: Continue Home Health Visits - [place] Nurse may visit PRN to address patient s wound care needs. FACE TO FACE ENCOUNTER: MEDICARE and MEDICAID PATIENTS: I certify that this patient is under my care and that I had a face-to-face encounter that meets the physician face-to-face encounter requirements with this patient on this date. The encounter with the patient was in whole or in part for the following MEDICAL CONDITION: (primary reason for [place]) MEDICAL NECESSITY: I certify, that based on my findings, NURSING services are a medically necessary home health service. HOME BOUND STATUS: I certify that my clinical findings support that this patient is homebound (i.e., Due to illness or injury, pt requires aid of supportive devices such as crutches, cane, wheelchairs, walkers, the use of special transportation or the assistance of another person to leave their place of residence. There is a normal inability to leave the home and doing so requires considerable and taxing effort. Other absences are for medical reasons / religious services and are infrequent or of short duration when for other reasons). If current dressing causes regression in wound condition, may D/C ordered dressing product/s and apply Schoeller, Willson ([id number]) Normal Saline Moist Dressing daily until next [place] / Other MD appointment. [place] of regression in wound condition at [phone]. Please direct any NON-WOUND related issues/requests  for orders to patient's Primary Care Physician Wound #4 Right Calcaneus: [place] Nurse may visit PRN to address patient s wound care needs. FACE TO FACE ENCOUNTER: MEDICARE and MEDICAID PATIENTS: I certify that this patient is under my care and that I had a face-to-face encounter that meets the physician face-to-face encounter requirements with this patient on this date. The encounter with the patient was in whole or in part for the following MEDICAL  CONDITION: (primary reason for Home Healthcare) MEDICAL NECESSITY: I certify, that based on my findings, NURSING services are a medically necessary home health service. HOME BOUND STATUS: I certify that my clinical findings support that this patient is homebound (i.e., Due to illness or injury, pt requires aid of supportive devices such as crutches, cane, wheelchairs, walkers, the use of special transportation or the assistance of another person to leave their place of residence. There is a normal inability to leave the home and doing so requires considerable and taxing effort. Other absences are for medical reasons / religious services and are infrequent or of short duration when for other reasons). If current dressing causes regression in wound condition, may D/C ordered dressing product/s and apply Normal Saline Moist Dressing daily until next [place] / Other MD appointment. [place] of regression in wound condition at [phone]. Please direct any NON-WOUND related issues/requests for orders to patient's Primary Care Physician Wound #5 Right,Dorsal Foot: [place] Visits - [place] Nurse may visit PRN to address patient s wound care needs. FACE TO FACE ENCOUNTER: MEDICARE and MEDICAID PATIENTS: I certify that this patient is under my care and that I had a face-to-face encounter that meets the physician face-to-face  encounter requirements with this patient on this date. The encounter with the patient was in whole or in part for the following MEDICAL CONDITION: (primary reason for [place]) MEDICAL NECESSITY: I certify, that based on my findings, NURSING services are a medically necessary home health service. HOME BOUND STATUS: I certify that my clinical findings support that this patient is homebound (i.e., Due to illness or injury, pt requires aid of supportive devices such as crutches, cane, wheelchairs, walkers, the use of special transportation or the assistance of another person to leave their place of residence. There is a normal inability to leave the home and doing so requires considerable and taxing effort. Other absences are for medical reasons / religious services and are infrequent or of short duration when for other reasons). If current dressing causes regression in wound condition, may D/C ordered dressing product/s and apply Normal Saline Moist Dressing daily until next [place] / Other MD appointment. [place] of regression in wound condition at [phone]. Please direct any NON-WOUND related issues/requests for orders to patient's Primary Care Physician Wound #6 Left,Dorsal Foot: [place] Visits - [place] Nurse may visit PRN to address patient s wound care needs. FACE TO FACE ENCOUNTER: MEDICARE and MEDICAID PATIENTS: I certify that this patient is under my care and that I had a face-to-face encounter that meets the physician face-to-face encounter requirements with this patient on this date. The encounter with the patient was in whole or in part for the following MEDICAL CONDITION: (primary reason for [place]) MEDICAL NECESSITY: I certify, that based on my findings, NURSING services are a medically necessary home health service. HOME BOUND STATUS: I certify that my clinical findings support that this patient  is homebound (i.e., Due to illness or injury, pt requires aid of supportive devices such as crutches, cane, wheelchairs, walkers, the use of special transportation or the assistance of another person to leave their place of residence. There is a normal inability to leave the home and doing so requires considerable and taxing effort. Other absences are for medical reasons / religious services and are infrequent or of short duration when for other reasons). If current dressing causes regression  in wound condition, may D/C ordered dressing product/s and apply Mayhan, Ev[place] ([id number]) Normal Saline Moist Dressing daily until next [place] / Other MD appointment. [place] of regression in wound condition at [phone]. Please direct any NON-WOUND related issues/requests for orders to patient's Primary Care Physician Medications-please add to medication list.: Wound #3 Left Calcaneus: P.O. Antibiotics - Cipro Other: - Vitamin C, Zinc, Multivitamin Wound #4 Right Calcaneus: Other: - Vitamin C, Zinc, Multivitamin Wound #5 Right,Dorsal Foot: Other: - Vitamin C, Zinc, Multivitamin Wound #6 Left,Dorsal Foot: Other: - Vitamin C, Zinc, Multivitamin Laboratory ordered were: Wound culture routine - left calcaneus #1 a significant deterioration since his last visit. I suspect he is sitting with full pressure during the day with his heel resting on the floor. His attendant says he wears socks during the day and the bunny boots at night. #2 looking through our cultures to this heel was last cultured and August and cultured Pseudomonas. I have therefore given him Cipro 500 twice a day for 10 days after verifying he was not on Coumadin. His left heel that had the drainage and the significant periwound tenderness #3 all areas of both heels received Cipro with some packing into the deep wounds. He has excoriations over the plantar aspect of both his feet which again I suspect  overwhelmingly heal up and down in this area. His attendant says he does this constantly Electronic Signature(s) Signed: 10/08/2016 4:59:44 PM By: Gretta Cool RN, BSN, Kim RN, BSN Signed: 10/10/2016 7:31:15 AM By: Linton Ham MD Previous Signature: 10/08/2016 4:55:30 PM Version By: Linton Ham MD Entered By: Gretta Cool RN, BSN, Kim on 10/08/2016 16:59:44 SAHM, [name] ([id number]) -------------------------------------------------------------------------------- SuperBill Details Patient Name: [name] Date of Service: 10/08/2016 Medical Record Patient Account Number: [id number] Number: Treating RN: Ahmed Prima [birth year] (59 y.o. 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 Pressure ulcer of right heel, stage 3 L89.623 Pressure ulcer of left heel, stage 3 Facility Procedures CPT4 Code: [id number] Description: [id number] - DEB SUBQ TISSUE 20 SQ CM/< ICD-10 Description Diagnosis L89.613 Pressure ulcer of right heel, stage 3 L89.623 Pressure ulcer of left heel, stage 3 Modifier: Quantity: 1 Physician Procedures CPT4 Code: [id number] Description: [id number] - WC PHYS SUBQ TISS 20 SQ CM ICD-10 Description Diagnosis L89.613 Pressure ulcer of right heel, stage 3 L89.623 Pressure ulcer of left heel, stage 3 Modifier: Quantity: 1 Electronic Signature(s) Signed: 10/08/2016 4:55:30 PM By: Linton Ham MD Entered By: Linton Ham on 10/08/2016 13:47:42

## 2016-10-09 NOTE — Progress Notes (Signed)
QUANDRE, WELSHANS ([id number]) Visit Report for 10/08/2016 Arrival Information Details Patient Name: Larry, Hughes Date of Service: 10/08/2016 12:45 PM Medical Record Number: [id number] Patient Account Number: [id number] Date of Birth/Sex: 03-07-1956 (60 y.o. Male) Treating RN: Ahmed Prima Primary Care Physician: [name] Other Clinician: Referring Physician: [name] Treating Physician/Extender: [name] in Treatment: 26 Visit Information History Since Last Visit All ordered tests and consults were completed: No Patient Arrived: Wheel Chair Added or deleted any medications: No Arrival Time: 13:04 Any new allergies or adverse reactions: No Accompanied By: caregiver Had a fall or experienced change in No activities of daily living that may affect Transfer Assistance: Other risk of falls: Patient Identification Verified: Yes Signs or symptoms of abuse/neglect since last No Secondary Verification Process Yes visito Completed: Hospitalized since last visit: No Patient Requires Transmission-Based No Pain Present Now: No Precautions: Patient Has Alerts: No Electronic Signature(s) Signed: 10/08/2016 4:47:20 PM By: Alric Quan Entered By: Alric Quan on 10/08/2016 13:05:04 Mester, [name] ([id number]) -------------------------------------------------------------------------------- Encounter Discharge Information Details Patient Name: [name] Date of Service: 10/08/2016 12:45 PM Medical Record Number: [id number] Patient Account Number: [id number] Date of Birth/Sex: 05-Nov-1956 (60 y.o. Male) Treating RN: Ahmed Prima Primary Care Physician: [name] Other Clinician: Referring Physician: [name] Treating Physician/Extender: [name] in Treatment: 26 Encounter Discharge Information Items Discharge Pain Level: 0 Discharge Condition: Stable Ambulatory Status: Wheelchair Discharge Destination: Nursing  Home Transportation: Private Auto Accompanied By: caregiver Schedule Follow-up Appointment: Yes Medication Reconciliation completed and provided to Patient/Care Yes Clayten Allcock: Provided on Clinical Summary of Care: 10/08/2016 Form Type Recipient Paper Patient DG Electronic Signature(s) Signed: 10/08/2016 4:47:20 PM By: Alric Quan Previous Signature: 10/08/2016 1:56:37 PM Version By: [name] Entered By: Alric Quan on 10/08/2016 [place], [name] ([id number]) -------------------------------------------------------------------------------- Lower Extremity Assessment Details Patient Name: [name] Date of Service: 10/08/2016 12:45 PM Medical Record Number: [id number] Patient Account Number: [id number] Date of Birth/Sex: 02-28-1956 (60 y.o. Male) Treating RN: Ahmed Prima Primary Care Physician: [name] Other Clinician: Referring Physician: [name] Treating Physician/Extender: [name] in Treatment: 26 Vascular Assessment Pulses: Posterior Tibial Dorsalis Pedis Palpable: [Left:Yes] [Right:Yes] Extremity colors, hair growth, and conditions: Extremity Color: [Left:Normal] [Right:Normal] Temperature of Extremity: [Left:Warm] [Right:Warm] Capillary Refill: [Left:< 3 seconds] [Right:< 3 seconds] Toe Nail Assessment Left: Right: Thick: Yes Yes Discolored: Yes Yes Deformed: No No Improper Length and Hygiene: Yes Yes Electronic Signature(s) Signed: 10/08/2016 4:47:20 PM By: Alric Quan Entered By: Alric Quan on 10/08/2016 13:09:49 Selke, Jaleen ([id number]) -------------------------------------------------------------------------------- Multi Wound Chart Details Patient Name: [name] Date of Service: 10/08/2016 12:45 PM Medical Record Number: [id number] Patient Account Number: [id number] Date of Birth/Sex: 12/25/1955 (60 y.o. Male) Treating RN: Carolyne Fiscal, Debi Primary Care Physician: [name] Other Clinician: Referring Physician: [name] Treating Physician/Extender: [name] in Treatment: 26 Vital Signs Height(in): 70 Pulse(bpm): Weight(lbs): Blood Pressure 98/61 (mmHg): Body Mass Index(BMI): Temperature(F): 97.6 Respiratory Rate 16 (breaths/min): Photos: [3:No Photos] [4:No Photos] [5:No Photos] Wound Location: [3:Left Calcaneus] [4:Right Calcaneus] [5:Right Foot - Dorsal] Wounding Event: [3:Pressure Injury] [4:Pressure Injury] [5:Shear/Friction] Primary Etiology: [3:Pressure Ulcer] [4:Pressure Ulcer] [5:Trauma, Other] Comorbid History: [3:Anemia] [4:Anemia] [5:Anemia] Date Acquired: [3:02/07/2016] [4:02/07/2016] [5:10/08/2016] Weeks of Treatment: [3:26] [4:26] [5:0] Wound Status: [3:Open] [4:Open] [5:Open] Measurements L x W x D 0.2x0.3x0.2 [4:0.1x0.1x0.1] [5:8.2x4.5x0.1] (cm) Area (cm) : [3:0.047] [4:0.008] [5:28.981] Volume (cm) : [3:0.009] [4:0.001] [5:2.898] % Reduction in Area: [3:99.80%] [4:99.90%] [5:N/A] % Reduction in Volume: 99.60% [4:99.90%] [5:N/A] Classification: [3:Category/Stage III] [4:Category/Stage III] [5:Partial  Thickness] Exudate Amount: [3:Large] [4:Medium] [5:Medium] Exudate Type: [3:Serous] [4:Serosanguineous] [5:Serosanguineous] Exudate Color: [3:amber] [4:red, brown] [5:red, brown] Foul Odor After [3:No] [4:Yes] [5:No] Cleansing: Odor Anticipated Due to N/A [4:No] [5:N/A] Product Use: Wound Margin: [3:Flat and Intact] [4:Flat and Intact] [5:Distinct, outline attached] Granulation Amount: [3:None Present (0%)] [4:None Present (0%)] [5:Large (67-100%)] Granulation Quality: [3:N/A] [4:N/A] [5:Pink] Necrotic Amount: [3:Large (67-100%)] [4:Large (67-100%)] [5:Small (1-33%)] Necrotic Tissue: [3:Eschar, Adherent Slough] [4:Eschar] [5:Adherent Slough] Exposed Structures: [3:Fat: Yes Fascia: No] [4:Fat: Yes Fascia: No] [5:Fascia: No Fat: No] Tendon: No Tendon: No Tendon: No Muscle: No Muscle: No Muscle:  No Joint: No Joint: No Joint: No Bone: No Bone: No Bone: No Limited to Skin Breakdown Epithelialization: Small (1-33%) Small (1-33%) None Periwound Skin Texture: Edema: No Edema: No No Abnormalities Noted Excoriation: No Excoriation: No Induration: No Induration: No Callus: No Callus: No Crepitus: No Crepitus: No Fluctuance: No Fluctuance: No Friable: No Friable: No Rash: No Rash: No Scarring: No Scarring: No Periwound Skin Moist: Yes Moist: Yes Maceration: Yes Moisture: Dry/Scaly: Yes Dry/Scaly: Yes Moist: Yes Maceration: No Maceration: No Periwound Skin Color: Atrophie Blanche: No Atrophie Blanche: No No Abnormalities Noted Cyanosis: No Cyanosis: No Ecchymosis: No Ecchymosis: No Erythema: No Erythema: No Hemosiderin Staining: No Hemosiderin Staining: No Mottled: No Mottled: No Pallor: No Pallor: No Rubor: No Rubor: No Temperature: No Abnormality No Abnormality No Abnormality Tenderness on Yes Yes Yes Palpation: Wound Preparation: Ulcer Cleansing: Ulcer Cleansing: Ulcer Cleansing: Rinsed/Irrigated with Rinsed/Irrigated with Rinsed/Irrigated with Saline Saline Saline Topical Anesthetic Topical Anesthetic Topical Anesthetic Applied: Other: lidocaine Applied: Other: lidocaine Applied: Other: lidocaine 4% 4% 4% Wound Number: 6 N/A N/A Photos: No Photos N/A N/A Wound Location: Left Foot - Dorsal N/A N/A Wounding Event: Shear/Friction N/A N/A Primary Etiology: Trauma, Other N/A N/A Comorbid History: Anemia N/A N/A Date Acquired: 10/08/2016 N/A N/A Weeks of Treatment: 0 N/A N/A Wound Status: Open N/A N/A Measurements L x W x D 1.5x1.4x0.1 N/A N/A (cm) Area (cm) : 1.649 N/A N/A [place], [name] ([id number]) Volume (cm) : 0.165 N/A N/A % Reduction in Area: N/A N/A N/A % Reduction in Volume: N/A N/A N/A Classification: Partial Thickness N/A N/A Exudate Amount: Medium N/A N/A Exudate Type: Serosanguineous N/A N/A Exudate Color: red, brown N/A  N/A Foul Odor After No N/A N/A Cleansing: Odor Anticipated Due to N/A N/A N/A Product Use: Wound Margin: Distinct, outline attached N/A N/A Granulation Amount: Large (67-100%) N/A N/A Granulation Quality: Pink N/A N/A Necrotic Amount: Small (1-33%) N/A N/A Necrotic Tissue: Adherent Slough N/A N/A Exposed Structures: Fascia: No N/A N/A Fat: No Tendon: No Muscle: No Joint: No Bone: No Limited to Skin Breakdown Epithelialization: None N/A N/A Periwound Skin Texture: No Abnormalities Noted N/A N/A Periwound Skin Moist: Yes N/A N/A Moisture: Periwound Skin Color: No Abnormalities Noted N/A N/A Temperature: No Abnormality N/A N/A Tenderness on Yes N/A N/A Palpation: Wound Preparation: Ulcer Cleansing: N/A N/A Rinsed/Irrigated with Saline Topical Anesthetic Applied: Other: lidocaine 4% Treatment Notes Electronic Signature(s) Signed: 10/08/2016 4:47:20 PM By: Alric Quan Entered By: Alric Quan on 10/08/2016 13:31:09 [name] ([id number]) -------------------------------------------------------------------------------- [place] Details Patient Name: [name] Date of Service: 10/08/2016 12:45 PM Medical Record Number: [id number] Patient Account Number: [id number] Date of Birth/Sex: 08/05/1956 (60 y.o. Male) Treating RN: Ahmed Prima Primary Care Physician: [name] Other Clinician: Referring Physician: [name] Treating Physician/Extender: [name] in Treatment: 26 Active Inactive Abuse / Safety / Falls / Self Care Management Nursing Diagnoses: Potential for falls Goals: Patient will  remain injury free Date Initiated: 04/08/2016 Goal Status: Active Interventions: Assess fall risk on admission and as needed Notes: Nutrition Nursing Diagnoses: Imbalanced nutrition Goals: Patient/caregiver agrees to and verbalizes understanding of need to use nutritional supplements and/or vitamins as  prescribed Date Initiated: 04/08/2016 Goal Status: Active Interventions: Assess patient nutrition upon admission and as needed per policy Notes: Orientation to the Wound Care Program Nursing Diagnoses: Knowledge deficit related to the wound healing center program Goals: Patient/caregiver will verbalize understanding of the [place] ([id number]) Date Initiated: 04/08/2016 Goal Status: Active Interventions: Provide education on orientation to the wound center Notes: Pain, Acute or Chronic Nursing Diagnoses: Pain, acute or chronic: actual or potential Potential alteration in comfort, pain Goals: Patient will verbalize adequate pain control and receive pain control interventions during procedures as needed Date Initiated: 04/08/2016 Goal Status: Active Interventions: Assess comfort goal upon admission Complete pain assessment as per visit requirements Notes: Pressure Nursing Diagnoses: Knowledge deficit related to causes and risk factors for pressure ulcer development Knowledge deficit related to management of pressures ulcers Goals: Patient will remain free from development of additional pressure ulcers Date Initiated: 04/08/2016 Goal Status: Active Interventions: Assess offloading mechanisms upon admission and as needed Assess potential for pressure ulcer upon admission and as needed Notes: Wound/Skin Impairment Nursing Diagnoses: Impaired tissue integrity [place], [name] ([id number]) Goals: Ulcer/skin breakdown will have a volume reduction of 30% by week 4 Date Initiated: 04/08/2016 Goal Status: Active Ulcer/skin breakdown will have a volume reduction of 50% by week 8 Date Initiated: 04/08/2016 Goal Status: Active Ulcer/skin breakdown will have a volume reduction of 80% by week 12 Date Initiated: 04/08/2016 Goal Status: Active Interventions: Assess ulceration(s) every visit Notes: Electronic Signature(s) Signed: 10/08/2016 4:47:20 PM  By: Alric Quan Entered By: Alric Quan on 10/08/2016 13:23:59 Hughes, Larry ([id number]) -------------------------------------------------------------------------------- Pain Assessment Details Patient Name: [name] Date of Service: 10/08/2016 12:45 PM Medical Record Number: [id number] Patient Account Number: [id number] Date of Birth/Sex: [birth year] (60 y.o. Male) Treating RN: Ahmed Prima Primary Care Physician: [name] Other Clinician: Referring Physician: [name] Treating Physician/Extender: [name] in Treatment: 26 Active Problems Location of Pain Severity and Description of Pain Patient Has Paino No Site Locations With Dressing Change: No Pain Management and Medication Current Pain Management: Electronic Signature(s) Signed: 10/08/2016 4:47:20 PM By: Alric Quan Entered By: Alric Quan on 10/08/2016 13:05:10 [name] ([id number]) -------------------------------------------------------------------------------- Patient/Caregiver Education Details Patient Name: [name] Date of Service: 10/08/2016 12:45 PM Medical Record Number: [id number] Patient Account Number: [id number] Date of Birth/Gender: 1956-09-21 (60 y.o. Male) Treating RN: Ahmed Prima Primary Care Physician: [name] Other Clinician: Referring Physician: [name] Treating Physician/Extender: [name] in Treatment: 26 Education Assessment Education Provided To: Patient Education Topics Provided Wound/Skin Impairment: Handouts: Other: change dressing as ordered Methods: Demonstration, Explain/Verbal Responses: State content correctly Electronic Signature(s) Signed: 10/08/2016 4:47:20 PM By: Alric Quan Entered By: Alric Quan on 10/08/2016 [place], [place] ([id number]) -------------------------------------------------------------------------------- Wound Assessment Details Patient Name:  [name] Date of Service: 10/08/2016 12:45 PM Medical Record Number: [id number] Patient Account Number: [id number] Date of Birth/Sex: [birth year] (60 y.o. Male) Treating RN: Ahmed Prima Primary Care Physician: [name] Other Clinician: Referring Physician: [name] Treating Physician/Extender: [name] in Treatment: 26 Wound Status Wound Number: 3 Primary Etiology: Pressure Ulcer Wound Location: Left Calcaneus Wound Status: Open Wounding Event: Pressure Injury Comorbid History: Anemia Date Acquired: 02/07/2016 Weeks Of Treatment: 26 Clustered Wound: No Photos Photo Uploaded By: Alric Quan  on 10/08/2016 16:49:56 Wound Measurements Length: (cm) 0.2 Width: (cm) 0.3 Depth: (cm) 0.2 Area: (cm) 0.047 Volume: (cm) 0.009 % Reduction in Area: 99.8% % Reduction in Volume: 99.6% Epithelialization: Small (1-33%) Tunneling: No Undermining: No Wound Description Classification: Category/Stage III Wound Margin: Flat and Intact Exudate Amount: Large Exudate Type: Serous Exudate Color: amber Foul Odor After Cleansing: No Wound Bed Granulation Amount: None Present (0%) Exposed Structure Necrotic Amount: Large (67-100%) Fascia Exposed: No Necrotic Quality: Eschar, Adherent Slough Fat Layer Exposed: Yes Tendon Exposed: No Hughes, Larry ([id number]) Muscle Exposed: No Joint Exposed: No Bone Exposed: No Periwound Skin Texture Texture Color No Abnormalities Noted: No No Abnormalities Noted: No Callus: No Atrophie Blanche: No Crepitus: No Cyanosis: No Excoriation: No Ecchymosis: No Fluctuance: No Erythema: No Friable: No Hemosiderin Staining: No Induration: No Mottled: No Localized Edema: No Pallor: No Rash: No Rubor: No Scarring: No Temperature / Pain Moisture Temperature: No Abnormality No Abnormalities Noted: No Tenderness on Palpation: Yes Dry / Scaly: Yes Maceration: No Moist: Yes Wound Preparation Ulcer Cleansing:  Rinsed/Irrigated with Saline Topical Anesthetic Applied: Other: lidocaine 4%, Treatment Notes Wound #3 (Left Calcaneus) 1. Cleansed with: Clean wound with Normal Saline 2. Anesthetic Topical Lidocaine 4% cream to wound bed prior to debridement 4. Dressing Applied: Aquacel Ag 5. Secondary Dressing Applied Dry Gauze Kerlix/Conform Notes heel cups, netting Electronic Signature(s) Signed: 10/08/2016 4:47:20 PM By: Alric Quan Entered By: Alric Quan on 10/08/2016 13:23:41 [place]ann, Elonzo ([id number]) -------------------------------------------------------------------------------- Wound Assessment Details Patient Name: [name] Date of Service: 10/08/2016 12:45 PM Medical Record Number: [id number] Patient Account Number: [id number] Date of Birth/Sex: 1956/07/15 (60 y.o. Male) Treating RN: Carolyne Fiscal, Debi Primary Care Physician: [name] Other Clinician: Referring Physician: [name] Treating Physician/Extender: [name] in Treatment: 26 Wound Status Wound Number: 4 Primary Etiology: Pressure Ulcer Wound Location: Right Calcaneus Wound Status: Open Wounding Event: Pressure Injury Comorbid History: Anemia Date Acquired: 02/07/2016 Weeks Of Treatment: 26 Clustered Wound: No Photos Wound Measurements Length: (cm) 0.1 Width: (cm) 0.1 Depth: (cm) 0.1 Area: (cm) 0.008 Volume: (cm) 0.001 % Reduction in Area: 99.9% % Reduction in Volume: 99.9% Epithelialization: Small (1-33%) Tunneling: No Undermining: No Wound Description Classification: Category/Stage III Wound Margin: Flat and Intact Exudate Amount: Medium Exudate Type: Serosanguineous Exudate Color: red, brown Foul Odor After Cleansing: Yes Due to Product Use: No Wound Bed Granulation Amount: None Present (0%) Exposed Structure Necrotic Amount: Large (67-100%) Fascia Exposed: No Necrotic Quality: Eschar Fat Layer Exposed: Yes Tendon Exposed: No Muscle Exposed:  No Hughes, Larry ([id number]) Joint Exposed: No Bone Exposed: No Periwound Skin Texture Texture Color No Abnormalities Noted: No No Abnormalities Noted: No Callus: No Atrophie Blanche: No Crepitus: No Cyanosis: No Excoriation: No Ecchymosis: No Fluctuance: No Erythema: No Friable: No Hemosiderin Staining: No Induration: No Mottled: No Localized Edema: No Pallor: No Rash: No Rubor: No Scarring: No Temperature / Pain Moisture Temperature: No Abnormality No Abnormalities Noted: No Tenderness on Palpation: Yes Dry / Scaly: Yes Maceration: No Moist: Yes Wound Preparation Ulcer Cleansing: Rinsed/Irrigated with Saline Topical Anesthetic Applied: Other: lidocaine 4%, Treatment Notes Wound #4 (Right Calcaneus) 1. Cleansed with: Clean wound with Normal Saline 2. Anesthetic Topical Lidocaine 4% cream to wound bed prior to debridement 4. Dressing Applied: Aquacel Ag 5. Secondary Dressing Applied Dry Gauze Kerlix/Conform Notes heel cups, netting Electronic Signature(s) Signed: 10/08/2016 4:53:38 PM By: Alric Quan Previous Signature: 10/08/2016 4:47:20 PM Version By: Alric Quan Entered By: Alric Quan on 10/08/2016 16:51:27 Hughes, Larry ([id number]) -------------------------------------------------------------------------------- Wound Assessment Details Patient  Name: JAEVYN, Hughes Date of Service: 10/08/2016 12:45 PM Medical Record Number: [id number] Patient Account Number: [id number] Date of Birth/Sex: April 23, 1956 (60 y.o. Male) Treating RN: Carolyne Fiscal, Debi Primary Care Physician: [name] Other Clinician: Referring Physician: [name] Treating Physician/Extender: [name] in Treatment: 26 Wound Status Wound Number: 5 Primary Etiology: Trauma, Other Wound Location: Right Foot - Dorsal Wound Status: Open Wounding Event: Shear/Friction Comorbid History: Anemia Date Acquired: 10/08/2016 Weeks Of Treatment: 0 Clustered  Wound: No Photos Wound Measurements Length: (cm) 8.2 Width: (cm) 4.5 Depth: (cm) 0.1 Area: (cm) 28.981 Volume: (cm) 2.898 % Reduction in Area: 0% % Reduction in Volume: 0% Epithelialization: None Tunneling: No Undermining: No Wound Description Classification: Partial Thickness Wound Margin: Distinct, outline attached Exudate Amount: Medium Exudate Type: Serosanguineous Exudate Color: red, brown Foul Odor After Cleansing: No Wound Bed Granulation Amount: Large (67-100%) Exposed Structure Granulation Quality: Pink Fascia Exposed: No Necrotic Amount: Small (1-33%) Fat Layer Exposed: No Necrotic Quality: Adherent Slough Tendon Exposed: No Muscle Exposed: No Hughes, Larry ([id number]) Joint Exposed: No Bone Exposed: No Limited to Skin Breakdown Periwound Skin Texture Texture Color No Abnormalities Noted: No No Abnormalities Noted: No Moisture Temperature / Pain No Abnormalities Noted: No Temperature: No Abnormality Maceration: Yes Tenderness on Palpation: Yes Moist: Yes Wound Preparation Ulcer Cleansing: Rinsed/Irrigated with Saline Topical Anesthetic Applied: Other: lidocaine 4%, Treatment Notes Wound #5 (Right, Dorsal Foot) 1. Cleansed with: Clean wound with Normal Saline 2. Anesthetic Topical Lidocaine 4% cream to wound bed prior to debridement 4. Dressing Applied: Aquacel Ag 5. Secondary Dressing Applied ABD Pad Kerlix/Conform 7. Secured with Tape Notes netting Electronic Signature(s) Signed: 10/08/2016 4:53:38 PM By: Alric Quan Previous Signature: 10/08/2016 4:47:20 PM Version By: Alric Quan Entered By: Alric Quan on 10/08/2016 16:51:59 Hughes, Larry ([id number]) -------------------------------------------------------------------------------- Wound Assessment Details Patient Name: [name] Date of Service: 10/08/2016 12:45 PM Medical Record Number: [id number] Patient Account Number: [id number] Date of Birth/Sex: 1956/05/22  (60 y.o. Male) Treating RN: Carolyne Fiscal, Debi Primary Care Physician: [name] Other Clinician: Referring Physician: [name] Treating Physician/Extender: [name] in Treatment: 26 Wound Status Wound Number: 6 Primary Etiology: Trauma, Other Wound Location: Left Foot - Dorsal Wound Status: Open Wounding Event: Shear/Friction Comorbid History: Anemia Date Acquired: 10/08/2016 Weeks Of Treatment: 0 Clustered Wound: No Photos Wound Measurements Length: (cm) 1.5 Width: (cm) 1.4 Depth: (cm) 0.1 Area: (cm) 1.649 Volume: (cm) 0.165 % Reduction in Area: 0% % Reduction in Volume: 0% Epithelialization: None Tunneling: No Undermining: No Wound Description Classification: Partial Thickness Wound Margin: Distinct, outline attached Exudate Amount: Medium Exudate Type: Serosanguineous Exudate Color: red, brown Foul Odor After Cleansing: No Wound Bed Granulation Amount: Large (67-100%) Exposed Structure Granulation Quality: Pink Fascia Exposed: No Necrotic Amount: Small (1-33%) Fat Layer Exposed: No Necrotic Quality: Adherent Slough Tendon Exposed: No Muscle Exposed: No Hughes, Larry ([id number]) Joint Exposed: No Bone Exposed: No Limited to Skin Breakdown Periwound Skin Texture Texture Color No Abnormalities Noted: No No Abnormalities Noted: No Moisture Temperature / Pain No Abnormalities Noted: No Temperature: No Abnormality Moist: Yes Tenderness on Palpation: Yes Wound Preparation Ulcer Cleansing: Rinsed/Irrigated with Saline Topical Anesthetic Applied: Other: lidocaine 4%, Treatment Notes Wound #6 (Left, Dorsal Foot) 1. Cleansed with: Clean wound with Normal Saline 2. Anesthetic Topical Lidocaine 4% cream to wound bed prior to debridement 4. Dressing Applied: Aquacel Ag 5. Secondary Dressing Applied ABD Pad Kerlix/Conform 7. Secured with Tape Notes netting Electronic Signature(s) Signed: 10/08/2016 4:53:38 PM By:  Alric Quan Previous Signature: 10/08/2016 4:47:20 PM  Version By: Alric Quan Entered By: Alric Quan on 10/08/2016 16:52:20 [name], [name] ([id number]) -------------------------------------------------------------------------------- Vitals Details Patient Name: [name] Date of Service: 10/08/2016 12:45 PM Medical Record Number: [id number] Patient Account Number: [id number] Date of Birth/Sex: April 25, 1956 (60 y.o. Male) Treating RN: Ahmed Prima Primary Care Physician: [name] Other Clinician: Referring Physician: [name] Treating Physician/Extender: [name] in Treatment: 26 Vital Signs Time Taken: 13:05 Temperature (F): 97.6 Height (in): 70 Respiratory Rate (breaths/min): 16 Blood Pressure (mmHg): 98/61 Reference Range: 80 - 120 mg / dl Electronic Signature(s) Signed: 10/08/2016 4:47:20 PM By: Alric Quan Entered By: Alric Quan on 10/08/2016 13:07:55

## 2016-10-11 LAB — AEROBIC CULTURE  (SUPERFICIAL SPECIMEN)

## 2016-10-11 LAB — AEROBIC CULTURE W GRAM STAIN (SUPERFICIAL SPECIMEN)

## 2016-10-15 ENCOUNTER — Encounter: Payer: Medicare Other | Attending: Internal Medicine | Admitting: Internal Medicine

## 2016-10-15 DIAGNOSIS — Z87891 Personal history of nicotine dependence: Secondary | ICD-10-CM | POA: Diagnosis not present

## 2016-10-15 DIAGNOSIS — L89613 Pressure ulcer of right heel, stage 3: Secondary | ICD-10-CM | POA: Insufficient documentation

## 2016-10-15 DIAGNOSIS — L89623 Pressure ulcer of left heel, stage 3: Secondary | ICD-10-CM | POA: Diagnosis not present

## 2016-10-15 DIAGNOSIS — Z8673 Personal history of transient ischemic attack (TIA), and cerebral infarction without residual deficits: Secondary | ICD-10-CM | POA: Diagnosis not present

## 2016-10-15 DIAGNOSIS — I70245 Atherosclerosis of native arteries of left leg with ulceration of other part of foot: Secondary | ICD-10-CM | POA: Insufficient documentation

## 2016-10-15 DIAGNOSIS — I70234 Atherosclerosis of native arteries of right leg with ulceration of heel and midfoot: Secondary | ICD-10-CM | POA: Insufficient documentation

## 2016-10-16 NOTE — Progress Notes (Signed)
[name] ([id number]) Visit Report for 10/15/2016 Arrival Information Details Patient Name: [name] Date of Service: 10/15/2016 3:00 PM Medical Record Number: [id number] Patient Account Number: [id number] Date of Birth/Sex: [birth year] (60 y.o. Male) Treating RN: Ahmed Prima Primary Care Physician: [name] Other Clinician: Referring Physician: [name] Treating Physician/Extender: [name] in Treatment: 27 Visit Information History Since Last Visit All ordered tests and consults were completed: No Patient Arrived: Wheel Chair Added or deleted any medications: No Arrival Time: 15:10 Any new allergies or adverse reactions: No Accompanied By: caregivers Had a fall or experienced change in No activities of daily living that may affect Transfer Assistance: Other risk of falls: Patient Requires Transmission-Based No Signs or symptoms of abuse/neglect since last No Precautions: visito Patient Has Alerts: No Hospitalized since last visit: No Pain Present Now: No Electronic Signature(s) Signed: 10/15/2016 4:44:01 PM By: Alric Quan Entered By: Alric Quan on 10/15/2016 15:12:39 [name] ([id number]) -------------------------------------------------------------------------------- Encounter Discharge Information Details Patient Name: [name] Date of Service: 10/15/2016 3:00 PM Medical Record Number: [id number] Patient Account Number: [id number] Date of Birth/Sex: 04-13-1956 (60 y.o. Male) Treating RN: Ahmed Prima Primary Care Physician: [name] Other Clinician: Referring Physician: [name] Treating Physician/Extender: [name] in Treatment: 27 Encounter Discharge Information Items Discharge Pain Level: 0 Discharge Condition: Stable Ambulatory Status: Wheelchair Discharge Destination: Nursing Home Transportation: Other Accompanied By: caregivers Schedule Follow-up Appointment:  Yes Medication Reconciliation completed and provided to Patient/Care Yes [name]: Provided on Clinical Summary of Care: 10/15/2016 Form Type Recipient Paper Patient DG Electronic Signature(s) Signed: 10/15/2016 4:21:32 PM By: [name] Entered By: [name] on 10/15/2016 16:21:31 [name] ([id number]) -------------------------------------------------------------------------------- Lower Extremity Assessment Details Patient Name: [name] Date of Service: 10/15/2016 3:00 PM Medical Record Number: [id number] Patient Account Number: [id number] Date of Birth/Sex: [birth year] (60 y.o. Male) Treating RN: Ahmed Prima Primary Care Physician: [name] Other Clinician: Referring Physician: [name] Treating Physician/Extender: [name] in Treatment: 27 Vascular Assessment Pulses: Posterior Tibial Dorsalis Pedis Palpable: [Left:Yes] [Right:Yes] Extremity colors, hair growth, and conditions: Extremity Color: [Left:Normal] [Right:Normal] Temperature of Extremity: [Left:Warm] [Right:Warm] Capillary Refill: [Left:< 3 seconds] [Right:< 3 seconds] Toe Nail Assessment Left: Right: Thick: Yes Yes Discolored: Yes Yes Deformed: No No Improper Length and Hygiene: Yes Yes Electronic Signature(s) Signed: 10/15/2016 4:44:01 PM By: Alric Quan Entered By: Alric Quan on 10/15/2016 15:13:39 [name] ([id number]) -------------------------------------------------------------------------------- Multi Wound Chart Details Patient Name: [name] Date of Service: 10/15/2016 3:00 PM Medical Record Number: [id number] Patient Account Number: [id number] Date of Birth/Sex: Mar 28, 1956 (60 y.o. Male) Treating RN: Carolyne Fiscal, Debi Primary Care Physician: [name] Other Clinician: Referring Physician: [name] Treating Physician/Extender: [name] in Treatment: 27 Vital Signs Height(in): 70 Pulse(bpm):  66 Weight(lbs): Blood Pressure 138/69 (mmHg): Body Mass Index(BMI): Temperature(F): 97.9 Respiratory Rate 16 (breaths/min): Photos: [3:No Photos] [4:No Photos] [5:No Photos] Wound Location: [3:Left Calcaneus] [4:Right Calcaneus] [5:Right Foot - Dorsal] Wounding Event: [3:Pressure Injury] [4:Pressure Injury] [5:Shear/Friction] Primary Etiology: [3:Pressure Ulcer] [4:Pressure Ulcer] [5:Trauma, Other] Comorbid History: [3:Anemia] [4:Anemia] [5:Anemia] Date Acquired: [3:02/07/2016] [4:02/07/2016] [5:10/08/2016] Weeks of Treatment: [3:27] [4:27] [5:1] Wound Status: [3:Open] [4:Open] [5:Open] Measurements L x W x D 0.4x1.3x0.6 [4:0.1x0.1x0.1] [5:9x3.5x0.1] (cm) Area (cm) : [3:0.408] [4:0.008] [5:24.74] Volume (cm) : [3:0.245] [4:0.001] [5:2.474] % Reduction in Area: [3:98.10%] [4:99.90%] [5:14.60%] % Reduction in Volume: 88.50% [4:99.90%] [5:14.60%] Starting Position 1 [3:12] (o'clock): Ending Position 1 [3:12] (o'clock): Maximum Distance 1 0.6 (cm): Undermining: [3:Yes] [4:No] [5:No] Classification: [3:Category/Stage III] [4:Category/Stage III] [  5:Partial Thickness] Exudate Amount: [3:Large] [4:Medium] [5:Large] Exudate Type: [3:Purulent] [4:Serosanguineous] [5:Purulent] Exudate Color: [3:yellow, brown, [place]] [4:red, brown] [5:yellow, brown, [place]] Foul Odor After [3:No] [4:Yes] [5:No] Cleansing: Odor Anticipated Due to N/A [4:No] [5:N/A] Product Use: Wound Margin: [3:Flat and Intact] [4:Flat and Intact] [5:Distinct, outline attached] Granulation Amount: None Present (0%) None Present (0%) Large (67-100%) Granulation Quality: N/A N/A Pink Necrotic Amount: Large (67-100%) Large (67-100%) Small (1-33%) Necrotic Tissue: Eschar, Adherent Slough Eschar Adherent Slough Exposed Structures: Fat: Yes Fat: Yes Fascia: No Fascia: No Fascia: No Fat: No Tendon: No Tendon: No Tendon: No Muscle: No Muscle: No Muscle: No Joint: No Joint: No Joint: No Bone: No Bone:  No Bone: No Limited to Skin Breakdown Epithelialization: Small (1-33%) Small (1-33%) None Periwound Skin Texture: Edema: No Edema: No No Abnormalities Noted Excoriation: No Excoriation: No Induration: No Induration: No Callus: No Callus: No Crepitus: No Crepitus: No Fluctuance: No Fluctuance: No Friable: No Friable: No Rash: No Rash: No Scarring: No Scarring: No Periwound Skin Moist: Yes Moist: Yes Maceration: Yes Moisture: Dry/Scaly: Yes Dry/Scaly: Yes Moist: Yes Maceration: No Maceration: No Periwound Skin Color: Atrophie Blanche: No Atrophie Blanche: No No Abnormalities Noted Cyanosis: No Cyanosis: No Ecchymosis: No Ecchymosis: No Erythema: No Erythema: No Hemosiderin Staining: No Hemosiderin Staining: No Mottled: No Mottled: No Pallor: No Pallor: No Rubor: No Rubor: No Temperature: No Abnormality No Abnormality No Abnormality Tenderness on Yes Yes Yes Palpation: Wound Preparation: Ulcer Cleansing: Ulcer Cleansing: Ulcer Cleansing: Rinsed/Irrigated with Rinsed/Irrigated with Rinsed/Irrigated with Saline Saline Saline Topical Anesthetic Topical Anesthetic Topical Anesthetic Applied: Other: lidocaine Applied: Other: lidocaine Applied: Other: lidocaine 4% 4% 4% Wound Number: 6 N/A N/A Photos: No Photos N/A N/A Wound Location: Left Foot - Dorsal N/A N/A Wounding Event: Shear/Friction N/A N/A Primary Etiology: Trauma, Other N/A N/A Comorbid History: Anemia N/A N/A Shampine, Geordan ([id number]) Date Acquired: 10/08/2016 N/A N/A Weeks of Treatment: 1 N/A N/A Wound Status: Open N/A N/A Measurements L x W x D 1.5x1x0.1 N/A N/A (cm) Area (cm) : 1.178 N/A N/A Volume (cm) : 0.118 N/A N/A % Reduction in Area: 28.60% N/A N/A % Reduction in Volume: 28.50% N/A N/A Undermining: No N/A N/A Classification: Partial Thickness N/A N/A Exudate Amount: Medium N/A N/A Exudate Type: Serosanguineous N/A N/A Exudate Color: red, brown N/A N/A Foul Odor After No N/A  N/A Cleansing: Odor Anticipated Due to N/A N/A N/A Product Use: Wound Margin: Distinct, outline attached N/A N/A Granulation Amount: Large (67-100%) N/A N/A Granulation Quality: Pink N/A N/A Necrotic Amount: Small (1-33%) N/A N/A Necrotic Tissue: Adherent Slough N/A N/A Exposed Structures: Fascia: No N/A N/A Fat: No Tendon: No Muscle: No Joint: No Bone: No Limited to Skin Breakdown Epithelialization: None N/A N/A Periwound Skin Texture: No Abnormalities Noted N/A N/A Periwound Skin Moist: Yes N/A N/A Moisture: Periwound Skin Color: No Abnormalities Noted N/A N/A Temperature: No Abnormality N/A N/A Tenderness on Yes N/A N/A Palpation: Wound Preparation: Ulcer Cleansing: N/A N/A Rinsed/Irrigated with Saline Topical Anesthetic Applied: Other: lidocaine 4% Treatment Notes JUMAL, HUSSAIN ([id number]) Electronic Signature(s) Signed: 10/15/2016 4:44:01 PM By: Alric Quan Entered By: Alric Quan on 10/15/2016 15:34:34 [name] ([id number]) -------------------------------------------------------------------------------- Multi-Disciplinary Care Plan Details Patient Name: [name] Date of Service: 10/15/2016 3:00 PM Medical Record Number: [id number] Patient Account Number: [id number] Date of Birth/Sex: 1956-06-13 (60 y.o. Male) Treating RN: Ahmed Prima Primary Care Physician: [name] Other Clinician: Referring Physician: [name] Treating Physician/Extender: [name] in Treatment: 58 Active Inactive Abuse / Safety / Falls /  Self Care Management Nursing Diagnoses: Potential for falls Goals: Patient will remain injury free Date Initiated: 04/08/2016 Goal Status: Active Interventions: Assess fall risk on admission and as needed Notes: Nutrition Nursing Diagnoses: Imbalanced nutrition Goals: Patient/caregiver agrees to and verbalizes understanding of need to use nutritional supplements and/or vitamins as  prescribed Date Initiated: 04/08/2016 Goal Status: Active Interventions: Assess patient nutrition upon admission and as needed per policy Notes: Orientation to the Wound Care Program Nursing Diagnoses: Knowledge deficit related to the wound healing center program Goals: Patient/caregiver will verbalize understanding of the [facility] ([id number]) Date Initiated: 04/08/2016 Goal Status: Active Interventions: Provide education on orientation to the wound center Notes: Pain, Acute or Chronic Nursing Diagnoses: Pain, acute or chronic: actual or potential Potential alteration in comfort, pain Goals: Patient will verbalize adequate pain control and receive pain control interventions during procedures as needed Date Initiated: 04/08/2016 Goal Status: Active Interventions: Assess comfort goal upon admission Complete pain assessment as per visit requirements Notes: Pressure Nursing Diagnoses: Knowledge deficit related to causes and risk factors for pressure ulcer development Knowledge deficit related to management of pressures ulcers Goals: Patient will remain free from development of additional pressure ulcers Date Initiated: 04/08/2016 Goal Status: Active Interventions: Assess offloading mechanisms upon admission and as needed Assess potential for pressure ulcer upon admission and as needed Notes: Wound/Skin Impairment Nursing Diagnoses: Impaired tissue integrity Hulce, Kodah ([id number]) Goals: Ulcer/skin breakdown will have a volume reduction of 30% by week 4 Date Initiated: 04/08/2016 Goal Status: Active Ulcer/skin breakdown will have a volume reduction of 50% by week 8 Date Initiated: 04/08/2016 Goal Status: Active Ulcer/skin breakdown will have a volume reduction of 80% by week 12 Date Initiated: 04/08/2016 Goal Status: Active Interventions: Assess ulceration(s) every visit Notes: Electronic Signature(s) Signed: 10/15/2016 4:44:01 PM  By: Alric Quan Entered By: Alric Quan on 10/15/2016 15:34:27 Coots, Dashaun ([id number]) -------------------------------------------------------------------------------- Pain Assessment Details Patient Name: [name] Date of Service: 10/15/2016 3:00 PM Medical Record Number: [id number] Patient Account Number: [id number] Date of Birth/Sex: Sep 30, 1956 (60 y.o. Male) Treating RN: Ahmed Prima Primary Care Physician: [name] Other Clinician: Referring Physician: [name] Treating Physician/Extender: [name] in Treatment: 27 Active Problems Location of Pain Severity and Description of Pain Patient Has Paino No Site Locations With Dressing Change: No Pain Management and Medication Current Pain Management: Electronic Signature(s) Signed: 10/15/2016 4:44:01 PM By: Alric Quan Entered By: Alric Quan on 10/15/2016 15:12:45 Higbie, Lorenso Hughes ([id number]) -------------------------------------------------------------------------------- Patient/Caregiver Education Details Patient Name: [name] Date of Service: 10/15/2016 3:00 PM Medical Record Number: [id number] Patient Account Number: [id number] Date of Birth/Gender: 1955/12/22 (60 y.o. Male) Treating RN: Ahmed Prima Primary Care Physician: [name] Other Clinician: Referring Physician: [name] Treating Physician/Extender: [name] in Treatment: 35 Education Assessment Education Provided To: Patient Education Topics Provided Wound/Skin Impairment: Handouts: Other: change dressing as ordered Methods: Demonstration, Explain/Verbal Responses: State content correctly Electronic Signature(s) Signed: 10/15/2016 4:44:01 PM By: Alric Quan Entered By: Alric Quan on 10/15/2016 15:44:22 Shipp, Izaak ([id number]) -------------------------------------------------------------------------------- Wound Assessment Details Patient Name: [name] Date of Service: 10/15/2016 3:00 PM Medical Record Number: [id number] Patient Account Number: [id number] Date of Birth/Sex: October 01, 1956 (60 y.o. Male) Treating RN: Ahmed Prima Primary Care Physician: [name] Other Clinician: Referring Physician: [name] Treating Physician/Extender: [name] in Treatment: 27 Wound Status Wound Number: 3 Primary Etiology: Pressure Ulcer Wound Location: Left Calcaneus Wound Status: Open Wounding Event: Pressure Injury Comorbid History: Anemia Date Acquired: 02/07/2016 Weeks Of  Treatment: 27 Clustered Wound: No Photos Photo Uploaded By: Alric Quan on 10/15/2016 15:42:54 Wound Measurements Length: (cm) 0.4 % Reduction i Width: (cm) 1.3 % Reduction i Depth: (cm) 0.6 Epithelializa Area: (cm) 0.408 Tunneling: Volume: (cm) 0.245 Undermining: Starting P Ending Pos Maximum Di n Area: 98.1% n Volume: 88.5% tion: Small (1-33%) No Yes osition (o'clock): 12 ition (o'clock): 12 stance: (cm) 0.6 Wound Description Classification: Category/Stage III Foul Odor Af Wound Margin: Flat and Intact Exudate Amount: Large Exudate Type: Purulent Exudate Color: yellow, brown, [place] ter Cleansing: No Wound Bed Bohorquez, Jeffery ([id number]) Granulation Amount: None Present (0%) Exposed Structure Necrotic Amount: Large (67-100%) Fascia Exposed: No Necrotic Quality: Eschar, Adherent Slough Fat Layer Exposed: Yes Tendon Exposed: No Muscle Exposed: No Joint Exposed: No Bone Exposed: No Periwound Skin Texture Texture Color No Abnormalities Noted: No No Abnormalities Noted: No Callus: No Atrophie Blanche: No Crepitus: No Cyanosis: No Excoriation: No Ecchymosis: No Fluctuance: No Erythema: No Friable: No Hemosiderin Staining: No Induration: No Mottled: No Localized Edema: No Pallor: No Rash: No Rubor: No Scarring: No Temperature / Pain Moisture Temperature: No Abnormality No Abnormalities Noted:  No Tenderness on Palpation: Yes Dry / Scaly: Yes Maceration: No Moist: Yes Wound Preparation Ulcer Cleansing: Rinsed/Irrigated with Saline Topical Anesthetic Applied: Other: lidocaine 4%, Treatment Notes Wound #3 (Left Calcaneus) 1. Cleansed with: Clean wound with Normal Saline 2. Anesthetic Topical Lidocaine 4% cream to wound bed prior to debridement 4. Dressing Applied: Aquacel Ag 5. Secondary Dressing Applied Dry Gauze Foam Kerlix/Conform 7. Secured with Tape Notes heel cups [place], Marcoantonio ([id number]) Electronic Signature(s) Signed: 10/15/2016 4:44:01 PM By: Alric Quan Entered By: Alric Quan on 10/15/2016 15:33:29 Hyle, Callahan ([id number]) -------------------------------------------------------------------------------- Wound Assessment Details Patient Name: [name] Date of Service: 10/15/2016 3:00 PM Medical Record Number: [id number] Patient Account Number: [id number] Date of Birth/Sex: 02-Oct-1956 (60 y.o. Male) Treating RN: Ahmed Prima Primary Care Physician: [name] Other Clinician: Referring Physician: [name] Treating Physician/Extender: [name] in Treatment: 27 Wound Status Wound Number: 4 Primary Etiology: Pressure Ulcer Wound Location: Right Calcaneus Wound Status: Open Wounding Event: Pressure Injury Comorbid History: Anemia Date Acquired: 02/07/2016 Weeks Of Treatment: 27 Clustered Wound: No Photos Photo Uploaded By: Alric Quan on 10/15/2016 15:42:55 Wound Measurements Length: (cm) 0.1 Width: (cm) 0.1 Depth: (cm) 0.1 Area: (cm) 0.008 Volume: (cm) 0.001 % Reduction in Area: 99.9% % Reduction in Volume: 99.9% Epithelialization: Small (1-33%) Tunneling: No Undermining: No Wound Description Classification: Category/Stage III Wound Margin: Flat and Intact Exudate Amount: Medium Exudate Type: Serosanguineous Exudate Color: red, brown Foul Odor After Cleansing: Yes Due to Product  Use: No Wound Bed Granulation Amount: None Present (0%) Exposed Structure Necrotic Amount: Large (67-100%) Fascia Exposed: No Necrotic Quality: Eschar Fat Layer Exposed: Yes Tendon Exposed: No Segovia, Edmond ([id number]) Muscle Exposed: No Joint Exposed: No Bone Exposed: No Periwound Skin Texture Texture Color No Abnormalities Noted: No No Abnormalities Noted: No Callus: No Atrophie Blanche: No Crepitus: No Cyanosis: No Excoriation: No Ecchymosis: No Fluctuance: No Erythema: No Friable: No Hemosiderin Staining: No Induration: No Mottled: No Localized Edema: No Pallor: No Rash: No Rubor: No Scarring: No Temperature / Pain Moisture Temperature: No Abnormality No Abnormalities Noted: No Tenderness on Palpation: Yes Dry / Scaly: Yes Maceration: No Moist: Yes Wound Preparation Ulcer Cleansing: Rinsed/Irrigated with Saline Topical Anesthetic Applied: Other: lidocaine 4%, Treatment Notes Wound #4 (Right Calcaneus) 1. Cleansed with: Clean wound with Normal Saline 2. Anesthetic Topical Lidocaine 4% cream to wound bed prior to debridement 4. Dressing  Applied: Aquacel Ag 5. Secondary Dressing Applied Dry Gauze Foam Kerlix/Conform 7. Secured with Tape Notes heel cups Electronic Signature(s) Signed: 10/15/2016 4:44:01 PM By: Alric Quan Entered By: Alric Quan on 10/15/2016 15:34:10 Kauer, Duard ([id number]) [place], Claborn ([id number]) -------------------------------------------------------------------------------- Wound Assessment Details Patient Name: [name] Date of Service: 10/15/2016 3:00 PM Medical Record Number: [id number] Patient Account Number: [id number] Date of Birth/Sex: 07-17-1956 (60 y.o. Male) Treating RN: Ahmed Prima Primary Care Physician: [name] Other Clinician: Referring Physician: [name] Treating Physician/Extender: [name] in Treatment: 27 Wound Status Wound Number: 5 Primary Etiology:  Trauma, Other Wound Location: Right Foot - Dorsal Wound Status: Open Wounding Event: Shear/Friction Comorbid History: Anemia Date Acquired: 10/08/2016 Weeks Of Treatment: 1 Clustered Wound: No Photos Photo Uploaded By: Alric Quan on 10/15/2016 15:43:30 Wound Measurements Length: (cm) 9 Width: (cm) 3.5 Depth: (cm) 0.1 Area: (cm) 24.74 Volume: (cm) 2.474 % Reduction in Area: 14.6% % Reduction in Volume: 14.6% Epithelialization: None Tunneling: No Undermining: No Wound Description Classification: Partial Thickness Wound Margin: Distinct, outline attached Exudate Amount: Large Exudate Type: Purulent Exudate Color: yellow, brown, [place] Foul Odor After Cleansing: No Wound Bed Granulation Amount: Large (67-100%) Exposed Structure Granulation Quality: Pink Fascia Exposed: No Necrotic Amount: Small (1-33%) Fat Layer Exposed: No Necrotic Quality: Adherent Slough Tendon Exposed: No Hairfield, Hero ([id number]) Muscle Exposed: No Joint Exposed: No Bone Exposed: No Limited to Skin Breakdown Periwound Skin Texture Texture Color No Abnormalities Noted: No No Abnormalities Noted: No Moisture Temperature / Pain No Abnormalities Noted: No Temperature: No Abnormality Maceration: Yes Tenderness on Palpation: Yes Moist: Yes Wound Preparation Ulcer Cleansing: Rinsed/Irrigated with Saline Topical Anesthetic Applied: Other: lidocaine 4%, Treatment Notes Wound #5 (Right, Dorsal Foot) 1. Cleansed with: Clean wound with Normal Saline 2. Anesthetic Topical Lidocaine 4% cream to wound bed prior to debridement 4. Dressing Applied: Aquacel Ag 5. Secondary Dressing Applied ABD Pad 7. Secured with Recruitment consultant) Signed: 10/15/2016 4:44:01 PM By: Alric Quan Entered By: Alric Quan on 10/15/2016 15:29:49 Geurts, Ad ([id number]) -------------------------------------------------------------------------------- Wound Assessment Details Patient Name:  [name] Date of Service: 10/15/2016 3:00 PM Medical Record Number: [id number] Patient Account Number: [id number] Date of Birth/Sex: [birth year] (60 y.o. Male) Treating RN: Ahmed Prima Primary Care Physician: [name] Other Clinician: Referring Physician: [name] Treating Physician/Extender: [name] in Treatment: 27 Wound Status Wound Number: 6 Primary Etiology: Trauma, Other Wound Location: Left Foot - Dorsal Wound Status: Open Wounding Event: Shear/Friction Comorbid History: Anemia Date Acquired: 10/08/2016 Weeks Of Treatment: 1 Clustered Wound: No Photos Photo Uploaded By: Alric Quan on 10/15/2016 15:43:30 Wound Measurements Length: (cm) 1.5 Width: (cm) 1 Depth: (cm) 0.1 Area: (cm) 1.178 Volume: (cm) 0.118 % Reduction in Area: 28.6% % Reduction in Volume: 28.5% Epithelialization: None Tunneling: No Undermining: No Wound Description Classification: Partial Thickness Wound Margin: Distinct, outline attached Exudate Amount: Medium Exudate Type: Serosanguineous Exudate Color: red, brown Foul Odor After Cleansing: No Wound Bed Granulation Amount: Large (67-100%) Exposed Structure Granulation Quality: Pink Fascia Exposed: No Necrotic Amount: Small (1-33%) Fat Layer Exposed: No Necrotic Quality: Adherent Slough Tendon Exposed: No Pai, Thaddaeus ([id number]) Muscle Exposed: No Joint Exposed: No Bone Exposed: No Limited to Skin Breakdown Periwound Skin Texture Texture Color No Abnormalities Noted: No No Abnormalities Noted: No Moisture Temperature / Pain No Abnormalities Noted: No Temperature: No Abnormality Moist: Yes Tenderness on Palpation: Yes Wound Preparation Ulcer Cleansing: Rinsed/Irrigated with Saline Topical Anesthetic Applied: Other: lidocaine 4%, Treatment Notes Wound #6 (Left, Dorsal Foot) 1. Cleansed with:  Clean wound with Normal Saline 2. Anesthetic Topical Lidocaine 4% cream to wound bed prior  to debridement 4. Dressing Applied: Aquacel Ag 5. Secondary Dressing Applied ABD Pad 7. Secured with Recruitment consultant) Signed: 10/15/2016 4:44:01 PM By: Alric Quan Entered By: Alric Quan on 10/15/2016 15:30:20 [name] ([id number]) -------------------------------------------------------------------------------- Vitals Details Patient Name: [name] Date of Service: 10/15/2016 3:00 PM Medical Record Number: [id number] Patient Account Number: [id number] Date of Birth/Sex: 04/03/1956 (60 y.o. Male) Treating RN: Carolyne Fiscal, Debi Primary Care Physician: [name] Other Clinician: Referring Physician: [name] Treating Physician/Extender: [name] in Treatment: 27 Vital Signs Time Taken: 15:12 Temperature (F): 97.9 Height (in): 70 Pulse (bpm): 66 Respiratory Rate (breaths/min): 16 Blood Pressure (mmHg): 138/69 Reference Range: 80 - 120 mg / dl Electronic Signature(s) Signed: 10/15/2016 4:44:01 PM By: Alric Quan Entered By: Alric Quan on 10/15/2016 15:13:09

## 2016-10-16 NOTE — Progress Notes (Addendum)
ARKA, VEDROS ([id number]) Visit Report for 10/15/2016 Chief Complaint Document Details Patient Name: Larry, Hughes Date of Service: 10/15/2016 3:00 PM Medical Record Patient Account Number: [id number] Number: Treating RN: Ahmed Prima June 23, 1956 (60 y.o. Other Clinician: Date of Birth/Sex: Male) Treating Dearra Myhand Primary Care Physician: [name] Physician/Extender: G Referring Physician: Claudie Revering in Treatment: 27 Information Obtained from: Patient Chief Complaint here for follow up on bilateral heel pressure ulcers Electronic Signature(s) Signed: 10/15/2016 4:39:43 PM By: Linton Ham MD Entered By: Linton Ham on 10/15/2016 16:03:45 Musich, Gergory ([id number]) -------------------------------------------------------------------------------- Debridement Details Patient Name: [name] Date of Service: 10/15/2016 3:00 PM Medical Record Patient Account Number: [id number] Number: Treating RN: Ahmed Prima Jan 23, 1956 (60 y.o. Other Clinician: Date of Birth/Sex: Male) Treating Ty [place], [place] Primary Care Physician: [name] Physician/Extender: G Referring Physician: Claudie Revering in Treatment: 27 Debridement Performed for Wound #4 Right Calcaneus Assessment: Performed By: Physician [name], MD Debridement: Debridement Pre-procedure Yes - 15:52 Verification/Time Out Taken: Start Time: 15:53 Pain Control: Lidocaine 4% Topical Solution Level: Skin/Subcutaneous Tissue Total Area Debrided (L x 0.1 (cm) x 0.1 (cm) = 0.01 (cm) W): Tissue and other Viable, Non-Viable, Exudate, Fibrin/Slough, Subcutaneous material debrided: Instrument: Curette Bleeding: Minimum Hemostasis Achieved: Pressure End Time: 15:55 Procedural Pain: 0 Post Procedural Pain: 0 Response to Treatment: Procedure was tolerated well Post Debridement Measurements of Total Wound Length: (cm) 0.6 Stage: Category/Stage  III Width: (cm) 2 Depth: (cm) 0.2 Volume: (cm) 0.188 Character of Wound/Ulcer Post Requires Further Debridement: Debridement Severity of Tissue Post Fat layer exposed Debridement: Post Procedure Diagnosis Same as Pre-procedure Electronic Signature(s) Signed: 10/15/2016 4:39:43 PM By: Linton Ham MD Saks, [name] ([id number]) Signed: 10/15/2016 4:44:01 PM By: Alric Quan Entered By: Linton Ham on 10/15/2016 16:03:22 [name] ([id number]) -------------------------------------------------------------------------------- Debridement Details Patient Name: [name] Date of Service: 10/15/2016 3:00 PM Medical Record Patient Account Number: [id number] Number: Treating RN: Ahmed Prima [birth year] (60 y.o. Other Clinician: Date of Birth/Sex: Male) Treating Maci E[place], [place] Primary Care Physician: [name] Physician/Extender: G Referring Physician: Claudie Revering in Treatment: 27 Debridement Performed for Wound #6 Left,Dorsal Foot Assessment: Performed By: Physician [name], MD Debridement: Debridement Pre-procedure Yes - 15:52 Verification/Time Out Taken: Start Time: 15:55 Pain Control: Lidocaine 4% Topical Solution Level: Skin/Subcutaneous Tissue Total Area Debrided (L x 1.5 (cm) x 1 (cm) = 1.5 (cm) W): Tissue and other Viable, Non-Viable, Exudate, Fibrin/Slough, Subcutaneous material debrided: Instrument: Curette Bleeding: Minimum Hemostasis Achieved: Pressure End Time: 15:58 Procedural Pain: 0 Post Procedural Pain: 0 Response to Treatment: Procedure was tolerated well Post Debridement Measurements of Total Wound Length: (cm) 1.5 Width: (cm) 1 Depth: (cm) 0.1 Volume: (cm) 0.118 Character of Wound/Ulcer Post Requires Further Debridement Debridement: Severity of Tissue Post Debridement: Fat layer exposed Post Procedure Diagnosis Same as Pre-procedure Electronic Signature(s) Signed: 10/15/2016 4:39:43 PM  By: Linton Ham MD Signed: 10/15/2016 4:44:01 PM By: Aldine Contes, [name] ([id number]) Entered By: Linton Ham on 10/15/2016 16:03:33 Venables, [name] ([id number]) -------------------------------------------------------------------------------- HPI Details Patient Name: [name] Date of Service: 10/15/2016 3:00 PM Medical Record Patient Account Number: [id number] Number: Treating RN: Ahmed Prima [birth year] (60 y.o. Other Clinician: Date of Birth/Sex: Male) Treating Laymond Postle Primary Care Physician: [name] Physician/Extender: G Referring Physician: Claudie Revering in Treatment: 27 History of Present Illness HPI Description: 04/08/16; this is a patient we really don't know too much about. He was hospitalized from 3/24 through 4/6 residing with a sodium of 167 creatinine of 2.49 right lower  lobe pneumonia sepsis/septic shock syndrome. He apparently came back to the assisted living [facility] living [facility]. He apparently had been there only 2 weeks before he went out. I know very little about him premorbidly. The hospital he had a CT scan of the head that showed atrophy and chronic small vessel white matter ischemic changes and remote infarcts also noted within the bilateral basal ganglia. His echocardiogram and presentation showed a EF of 20-25%, left ventricle was moderately dilated with diffuse hypokinesis. Apparently he return to his assisted living with wounds on his bilateral feet. According to the attendant came with him he could walk before he went out he could no longer walk now. There are wounds on both heels covered with a black eschar and also wounds on his bilateral dorsal feet. ABIs calculated in our clinic for 0.49 bilaterally. He was a smoker up to 2 months ago he is not smoking now. 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 He has  occlusions in the superficial femoral artery in the proximal mid and distal areas bilaterally. Monophasic waves distally bilaterally. He is going back at the end of the month I believe June 29 probably for an arteriogram as arranged by Dr. Delana Meyer in the meantime he has had no real changes 04/30/16; we are waiting vascular review on June 29. The black eschar over his bilateral heels is beginning to separate using Santyl. The areas on the dorsal aspect of his feet looked considerably better/resolved. He apparently has no complaints. He also has a severe presumably ischemic cardiomyopathy as noted above 05/21/16:pt returns today for ongoing evaluation of wounds. I reviewed recent venous and arterial diagnostic findings. He is scheduled for a LLE angio on 05/26/16 and RLE angio on 06/02/16 for advanced evaluation of PAD as noted per lower extremity arterial duplex. BLE venous duplex was negative for venous reflux, DVT or insufficiency. 06/10/16; this is a patient I have not seen in over a month although he was seen here on 7/12. When I admitted him to our clinic I felt he had pressure ulcerations but also potential for limb threatening ischemia. I referred him urgently to vascular surgery. Since we have last seen him in this clinic he has undergone 2 procedures. On 7/17 he will underwent percutaneous transluminal angioplasty of the left above-knee popliteal artery as well as the mid to distal SFA. On 7/27 he underwent for cutaneous transluminal angioplasty of the right popliteal artery and SFA an angioplasty of the proximal SFA. He had a stent placement in the right popliteal artery. The patient is difficult to gauge symptomatology although he does not currently complain of pain. Our intake nurse reports purulent drainage out of the left heel and an odor. 06/17/2016 -- right heel x-ray and left heel x-ray -- IMPRESSION:No radiographic evidence of osteomyelitis. Soft tissue changes are Gholson, Monty  ([id number]) consistent with cellulitis. He had a wound culture which is positive for abundant Citrobacter fundi and pseudomonas aeruginosa. Both are sensitive to ciprofloxacin and at the present time the patient is on doxycycline 100 mg, prescribed by Dr. Dellia Nims last week. I will prescribe ciprofloxacin for him today. 07/16/16 it is been quite a while since I've seen these wounds however both of them are quite a bit better. Using [place], being followed by [facility]. 07/30/16; I see this man every 2 weeks. He had ischemic wounds on his bilateral heels. He has been using  Santyl with [facility] coming out to his facility 08/12/16 Patient comes in today for a follow-up visit we see him every 2 weeks at this point in time. He has bilateral heel wounds the we have been applying Santyl to and he has home health coming out to this facility for these dressing changes. These wounds seem to be improving dramatically since he had the procedures with vascular to stent and improve his blood flow in the bilateral lower extremities. Currently he has been responding well to debridement with Dr. Dellia Nims as well. He tells me at this point he really has no sniffing discomfort or pain at this time. He is tolerating the dressing changes well at this point as well. 08/26/16; patient comes in for his two-week follow-up. He has bilateral heel pressure ulcers that were complicated by severe ischemia now corrected. Has been using Santyl to these areas. 09/17/2016 - Mr. Shapiro arrives today with an assistance from the facility that he resides at. Mr. Martelli offers no complaints, answering most questions "no" or "okay"; most information obtained from the assistant. Heel protectors are now only being worn at night, not throughout the day and the patient has a history of resting heels directly on the ground throughout the day versus on the foot rests of the wheelchair (R>L). Home health continues to provide  dressing changes. The caregiver/attendant was encouraged to bring in heel protectors to next weeks visit for evaluation in addition, the patient and the attendant were encouraged to wear to heel protector at all times. Mr Blanke remains non-ambulatory 10/08/16; patient arrives today in 3 week follow-up. When he was last seen here our staff and providers felt that he was well on his way to healing. He has home health changing the dressings. He arrives today with an odor coming out of the left heel that smelt like "rotten potatoes" according to our intake nurse. He is not been systemically unwell. The individual who accompanies him from the facility states that he uses his body boots at night but sits in the wheelchair rubbing his heal up and down on the dorsal aspect of the other foot. 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 Now status post revascularization. He had been doing quite nicely however last week presented with a very significant deterioration in the bilateral ulcers. Especially the area on the left had an older. CandS showed abundant Pseudomonas and Proteus. I empirically put him on ciprofloxacin although the Proteus was only intermediate in terms of its sensitivity. We have been using Aquacel Ag Electronic Signature(s) Signed: 10/15/2016 4:39:43 PM By: Linton Ham MD Entered By: Linton Ham on 10/15/2016 16:05:08 [name] ([id number]) -------------------------------------------------------------------------------- Physical Exam Details Patient Name: [name] Date of Service: 10/15/2016 3:00 PM Medical Record Patient Account Number: [id number] Number: Treating RN: Ahmed Prima 09/01/1956 (60 y.o. Other Clinician: Date of Birth/Sex: Male) Treating Amario [place] Primary Care Physician: [name] Physician/Extender: G Referring  Physician: Claudie Revering in Treatment: 27 Constitutional Sitting or standing Blood Pressure is within target range for patient.. Pulse regular and within target range for patient.Marland Kitchen Respirations regular, non-labored and within target range.. Temperature is normal and within the target range for the patient.. Patient's appearance is neat and clean. Appears in no acute distress. Well nourished and well developed.. Cardiovascular Pedal pulses palpable and strong bilaterally.. Notes Wound exam; the patient has 2 linear areas on both  heels. Both have depth and the left has odor and tenderness. Both required debridement with a #5 curet removing necrotic tissue, skin and subcutaneous tissue. Post debridement the beds of the wounds appear to be healthy. Hemostasis with direct pressure. He tolerated this marginally due to pain Electronic Signature(s) Signed: 10/15/2016 4:39:43 PM By: Linton Ham MD Entered By: Linton Ham on 10/15/2016 16:06:26 [name] ([id number]) -------------------------------------------------------------------------------- Physician Orders Details Patient Name: [name] Date of Service: 10/15/2016 3:00 PM Medical Record Patient Account Number: [id number] Number: Treating RN: Ahmed Prima 04-Mar-1956 (60 y.o. Other Clinician: Date of Birth/Sex: Male) Treating Alvia Jablonski Primary Care Physician: [name] Physician/Extender: G Referring Physician: Claudie Revering in Treatment: 33 Verbal / Phone Orders: Yes Clinician: Pinkerton, Debi Read Back and Verified: Yes Diagnosis Coding Wound Cleansing Wound #3 Left Calcaneus o Clean wound with Normal Saline. o Cleanse wound with mild soap and water - HHRN please was feet and legs with mild soap and water and dry. Wound #4 Right Calcaneus o Clean wound with Normal Saline. o Cleanse wound with mild soap and water - HHRN please was feet and legs with mild soap  and water and dry. Anesthetic Wound #3 Left Calcaneus o Topical Lidocaine 4% cream applied to wound bed prior to debridement - office use only Wound #4 Right Calcaneus o Topical Lidocaine 4% cream applied to wound bed prior to debridement - office use only Skin Barriers/Peri-Wound Care Wound #3 Left Calcaneus o Moisturizing lotion - Please put lotion on pts legs and feet***do not put on wound or between toes*** Wound #4 Right Calcaneus o Moisturizing lotion - Please put lotion on pts legs and feet***do not put on wound or between toes*** Primary Wound Dressing Wound #3 Left Calcaneus o Aquacel Ag - rope--pack lightly into wound and into undermining Wound #4 Right Calcaneus o Aquacel Ag - rope--pack lightly into wound and into undermining Wound #5 Right,Dorsal Foot o Aquacel Ag Vadala, Carlson ([id number]) Wound #6 Left,Dorsal Foot o Aquacel Ag Secondary Dressing Wound #3 Left Calcaneus o Dry Gauze o Conform/Kerlix - tape, netting o Foam - heel cups to heels Wound #4 Right Calcaneus o Dry Gauze o Conform/Kerlix - tape, netting o Foam - heel cups to heels Wound #5 Right,Dorsal Foot o ABD pad o Conform/Kerlix - tape, netting Wound #6 Left,Dorsal Foot o ABD pad o Conform/Kerlix - tape, netting Dressing Change Frequency Wound #3 Left Calcaneus o Change dressing every other day. Wound #4 Right Calcaneus o Change dressing every other day. Follow-up Appointments Wound #3 Left Calcaneus o Return Appointment in 1 week. Wound #4 Right Calcaneus o Return Appointment in 1 week. Wound #5 Right,Dorsal Foot o Return Appointment in 1 week. Wound #6 Left,Dorsal Foot o Return Appointment in 1 week. Off-Loading Wound #3 Left Calcaneus o Heel suspension boot to: - pt to wear sage boots, please float heels o Turn and reposition every 2 hours [place]iff, Heliodoro ([id number]) o Other: - [place]oyer Lift Wound #4 Right Calcaneus o Heel  suspension boot to: - pt to wear sage boots, please float heels o Turn and reposition every 2 hours o Other: - [place]oyer Lift Wound #5 Right,Dorsal Foot o Heel suspension boot to: - pt to wear sage boots, please float heels o Turn and reposition every 2 hours o Other: - [place]oyer Lift Wound #6 Left,Dorsal Foot o Heel suspension boot to: - pt to wear sage boots, please float heels o Turn and reposition every 2 hours o Other: - Advertising account planner Orders / Instructions  Wound #3 Left Calcaneus o Increase protein intake. Wound #4 Right Calcaneus o Increase protein intake. Wound #5 Right,Dorsal Foot o Increase protein intake. Wound #6 Left,Dorsal Foot o Increase protein intake. Home Health Wound #3 Left Calcaneus o Continue Home Health Visits - [place] Nurse may visit PRN to address patientos wound care needs. o FACE TO FACE ENCOUNTER: MEDICARE and MEDICAID PATIENTS: I certify that this patient is under my care and that I had a face-to-face encounter that meets the physician face-to-face encounter requirements with this patient on this date. The encounter with the patient was in whole or in part for the following MEDICAL CONDITION: (primary reason for [place]) MEDICAL NECESSITY: I certify, that based on my findings, NURSING services are a medically necessary home health service. HOME BOUND STATUS: I certify that my clinical findings support that this patient is homebound (i.e., Due to illness or injury, pt requires aid of supportive devices such as crutches, cane, wheelchairs, walkers, the use of special transportation or the assistance of another person to leave their place of residence. There is a normal inability to leave the home and doing so requires considerable and taxing effort. Other absences are for medical reasons / religious services and are infrequent or of short duration when for other reasons). o If current  dressing causes regression in wound condition, may D/C ordered dressing product/s and apply Normal Saline Moist Dressing daily until next [place] / Other MD appointment. [place] of regression in wound condition at [phone]. CLETUS, DELOSANGELES ([id number]) o Please direct any NON-WOUND related issues/requests for orders to patient's Primary Care Physician Wound #4 Right Calcaneus o [place] Visits - [place] Nurse may visit PRN to address patientos wound care needs. o FACE TO FACE ENCOUNTER: MEDICARE and MEDICAID PATIENTS: I certify that this patient is under my care and that I had a face-to-face encounter that meets the physician face-to-face encounter requirements with this patient on this date. The encounter with the patient was in whole or in part for the following MEDICAL CONDITION: (primary reason for [place]) MEDICAL NECESSITY: I certify, that based on my findings, NURSING services are a medically necessary home health service. HOME BOUND STATUS: I certify that my clinical findings support that this patient is homebound (i.e., Due to illness or injury, pt requires aid of supportive devices such as crutches, cane, wheelchairs, walkers, the use of special transportation or the assistance of another person to leave their place of residence. There is a normal inability to leave the home and doing so requires considerable and taxing effort. Other absences are for medical reasons / religious services and are infrequent or of short duration when for other reasons). o If current dressing causes regression in wound condition, may D/C ordered dressing product/s and apply Normal Saline Moist Dressing daily until next [place] / Other MD appointment. [place] of regression in wound condition at [phone]. o Please direct any NON-WOUND related issues/requests for orders to  patient's Primary Care Physician Wound #5 [place] Visits - [place] Nurse may visit PRN to address patientos wound care needs. o FACE TO FACE ENCOUNTER: MEDICARE and MEDICAID PATIENTS: I certify that this patient is under my care and that I had a face-to-face encounter that meets the physician face-to-face encounter requirements with this patient on this date. The encounter with the patient was in  whole or in part for the following MEDICAL CONDITION: (primary reason for Home Healthcare) MEDICAL NECESSITY: I certify, that based on my findings, NURSING services are a medically necessary home health service. HOME BOUND STATUS: I certify that my clinical findings support that this patient is homebound (i.e., Due to illness or injury, pt requires aid of supportive devices such as crutches, cane, wheelchairs, walkers, the use of special transportation or the assistance of another person to leave their place of residence. There is a normal inability to leave the home and doing so requires considerable and taxing effort. Other absences are for medical reasons / religious services and are infrequent or of short duration when for other reasons). o If current dressing causes regression in wound condition, may D/C ordered dressing product/s and apply Normal Saline Moist Dressing daily until next [place] / Other MD appointment. [place] of regression in wound condition at [phone]. o Please direct any NON-WOUND related issues/requests for orders to patient's Primary Care Physician Wound #6 [place] Visits - [place] Nurse may visit PRN to address patientos wound care needs. o FACE TO FACE ENCOUNTER: MEDICARE and MEDICAID PATIENTS: I certify that this patient is under my care and that I had a face-to-face encounter that meets the physician  face-to-face SAWADA, Carsten ([id number]) encounter requirements with this patient on this date. The encounter with the patient was in whole or in part for the following MEDICAL CONDITION: (primary reason for [place]) MEDICAL NECESSITY: I certify, that based on my findings, NURSING services are a medically necessary home health service. HOME BOUND STATUS: I certify that my clinical findings support that this patient is homebound (i.e., Due to illness or injury, pt requires aid of supportive devices such as crutches, cane, wheelchairs, walkers, the use of special transportation or the assistance of another person to leave their place of residence. There is a normal inability to leave the home and doing so requires considerable and taxing effort. Other absences are for medical reasons / religious services and are infrequent or of short duration when for other reasons). o If current dressing causes regression in wound condition, may D/C ordered dressing product/s and apply Normal Saline Moist Dressing daily until next [place] / Other MD appointment. [place] of regression in wound condition at [phone]. o Please direct any NON-WOUND related issues/requests for orders to patient's Primary Care Physician Medications-please add to medication list. Wound #3 Left Calcaneus o P.O. Antibiotics - Cefinir o Other: - Vitamin C, Zinc, Multivitamin Wound #4 Right Calcaneus o Other: - Vitamin C, Zinc, Multivitamin Wound #5 Right,Dorsal Foot o Other: - Vitamin C, Zinc, Multivitamin Wound #6 Left,Dorsal Foot o Other: - Vitamin C, Zinc, Multivitamin Electronic Signature(s) Signed: 10/15/2016 4:39:43 PM By: Linton Ham MD Signed: 10/15/2016 4:44:01 PM By: Alric Quan Entered By: Alric Quan on 10/15/2016 16:30:07 BREIGHTON, RATER ([id number]) -------------------------------------------------------------------------------- Prescription  10/15/2016 Patient Name: [name] Physician: [name] MD Date of Birth: 15-Jul-1956 NPI#: [id number] Sex: M DEA#: [id number] Phone #: [phone] License #: [id number] Patient Address: [place], [place] [address] [facility] [address], [place], [place] [address] [phone] Allergies NKDA 92 Orders P.O. Antibiotics - Cefinir Signature(s): Date(s): Electronic Signature(s) Signed: 10/15/2016 4:39:43 PM By: Linton Ham MD Signed: 10/15/2016 4:44:01 PM By: Alric Quan Entered By: Alric Quan on 10/15/2016 16:30:07 Fidler, Jamario ([id number]) --------------------------------------------------------------------------------  Problem List Details Patient Name: [name], [name] Date of Service: 10/15/2016 3:00 PM Medical Record Patient Account Number: [id number] Number: Treating RN: Ahmed Prima 1956/10/24 (60 y.o. 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 04/08/2016 Yes of other part of foot L89.613 Pressure ulcer of right heel, stage 3 09/18/2016 Yes L89.623 Pressure ulcer of left heel, stage 3 09/18/2016 Yes Inactive Problems Resolved Problems ICD-10 Code Description Active Date Resolved Date L89.610 Pressure ulcer of right heel, unstageable 04/08/2016 04/08/2016 L89.620 Pressure ulcer of left heel, unstageable 04/08/2016 04/08/2016 Electronic Signature(s) Signed: 10/15/2016 4:39:43 PM By: Linton Ham MD Entered By: Linton Ham on 10/15/2016  16:03:11 Seawright, Axle ([id number]) [name] ([id number]) -------------------------------------------------------------------------------- Progress Note Details Patient Name: [name] Date of Service: 10/15/2016 3:00 PM Medical Record Patient Account Number: [id number] Number: Treating RN: Ahmed Prima July 01, 1956 (60 y.o. Other Clinician: Date of Birth/Sex: Male) Treating Takeyah Wieman Primary Care Physician: [name] Physician/Extender: G Referring Physician: Claudie Revering in Treatment: 27 Subjective Chief Complaint Information obtained from Patient here for follow up on bilateral heel pressure ulcers History of Present Illness (HPI) 04/08/16; this is a patient we really don't know too much about. He was hospitalized from 3/24 through 4/6 residing with a sodium of 167 creatinine of 2.49 right lower lobe pneumonia sepsis/septic shock syndrome. He apparently came back to the assisted living [facility] living [facility]. He apparently had been there only 2 weeks before he went out. I know very little about him premorbidly. The hospital he had a CT scan of the head that showed atrophy and chronic small vessel white matter ischemic changes and remote infarcts also noted within the bilateral basal ganglia. His echocardiogram and presentation showed a EF of 20-25%, left ventricle was moderately dilated with diffuse hypokinesis. Apparently he return to his assisted living with wounds on his bilateral feet. According to the attendant came with him he could walk before he went out he could no longer walk now. There are wounds on both heels covered with a black eschar and also wounds on his bilateral dorsal feet. ABIs calculated in our clinic for 0.49 bilaterally. He was a smoker up to 2 months ago he is not smoking now. 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 He has  occlusions in the superficial femoral artery in the proximal mid and distal areas bilaterally. Monophasic waves distally bilaterally. He is going back at the end of the month I believe June 29 probably for an arteriogram as arranged by Dr. Delana Meyer in the meantime he has had no real changes 04/30/16; we are waiting vascular review on June 29. The black eschar over his bilateral heels is beginning to separate using Santyl. The areas on the dorsal aspect of his feet looked considerably better/resolved. He apparently has no complaints. He also has a severe presumably ischemic cardiomyopathy as noted above 05/21/16:pt returns today for ongoing evaluation of wounds. I reviewed recent venous and arterial diagnostic findings. He is scheduled for a LLE angio on 05/26/16 and RLE angio on 06/02/16 for advanced evaluation of PAD as noted per lower extremity arterial duplex. BLE venous duplex  was negative for venous reflux, DVT or insufficiency. 06/10/16; this is a patient I have not seen in over a month although he was seen here on 7/12. When I admitted him to our clinic I felt he had pressure ulcerations but also potential for limb threatening ischemia. I referred him urgently to vascular surgery. Since we have last seen him in this clinic he has undergone 2 procedures. On 7/17 he will underwent percutaneous transluminal angioplasty of the left above-knee popliteal artery as well as the mid to distal SFA. On 7/27 he underwent for cutaneous transluminal angioplasty of the right popliteal artery and SFA an angioplasty of the proximal SFA. He had a stent Rietz, Lakai ([id number]) placement in the right popliteal artery. The patient is difficult to gauge symptomatology although he does not currently complain of pain. Our intake nurse reports purulent drainage out of the left heel and an odor. 06/17/2016 -- right heel x-ray and left heel x-ray -- IMPRESSION:No radiographic evidence of osteomyelitis. Soft tissue  changes are consistent with cellulitis. He had a wound culture which is positive for abundant Citrobacter fundi and pseudomonas aeruginosa. Both are sensitive to ciprofloxacin and at the present time the patient is on doxycycline 100 mg, prescribed by Dr. Dellia Nims last week. I will prescribe ciprofloxacin for him today. 07/16/16 it is been quite a while since I've seen these wounds however both of them are quite a bit better. Using [place], being followed by [facility]. 07/30/16; I see this man every 2 weeks. He had ischemic wounds on his bilateral heels. He has been using Santyl with [facility] coming out to his facility 08/12/16 Patient comes in today for a follow-up visit we see him every 2 weeks at this point in time. He has bilateral heel wounds the we have been applying Santyl to and he has home health coming out to this facility for these dressing changes. These wounds seem to be improving dramatically since he had the procedures with vascular to stent and improve his blood flow in the bilateral lower extremities. Currently he has been responding well to debridement with Dr. Dellia Nims as well. He tells me at this point he really has no sniffing discomfort or pain at this time. He is tolerating the dressing changes well at this point as well. 08/26/16; patient comes in for his two-week follow-up. He has bilateral heel pressure ulcers that were complicated by severe ischemia now corrected. Has been using Santyl to these areas. 09/17/2016 - Mr. Threats arrives today with an assistance from the facility that he resides at. Mr. Hodor offers no complaints, answering most questions "no" or "okay"; most information obtained from the assistant. Heel protectors are now only being worn at night, not throughout the day and the patient has a history of resting heels directly on the ground throughout the day versus on the foot rests of the wheelchair (R>L). Home health continues to provide  dressing changes. The caregiver/attendant was encouraged to bring in heel protectors to next weeks visit for evaluation in addition, the patient and the attendant were encouraged to wear to heel protector at all times. Mr Fullard remains non-ambulatory 10/08/16; patient arrives today in 3 week follow-up. When he was last seen here our staff and providers felt that he was well on his way to healing. He has home health changing the dressings. He arrives today with an odor coming out of the left heel that smelt like "rotten potatoes" according to our intake nurse. He is not been  systemically unwell. The individual who accompanies him from the facility states that he uses his body boots at night but sits in the wheelchair rubbing his heal up and down on the dorsal aspect of the other foot. 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 Now status post revascularization. He had been doing quite nicely however last week presented with a very significant deterioration in the bilateral ulcers. Especially the area on the left had an older. CandS showed abundant Pseudomonas and Proteus. I empirically put him on ciprofloxacin although the Proteus was only intermediate in terms of its sensitivity. We have been using Aquacel Ag Objective Khawaja, Nash ([id number]) Constitutional Sitting or standing Blood Pressure is within target range for patient.. Pulse regular and within target range for patient.Marland Kitchen Respirations regular, non-labored and within target range.. Temperature is normal and within the target range for the patient.. Patient's appearance is neat and clean. Appears in no acute distress. Well nourished and well developed.. Vitals Time Taken: 3:12 PM, Height: 70 in, Temperature: 97.9 F, Pulse: 66 bpm, Respiratory Rate: 16 breaths/min, Blood Pressure: 138/69 mmHg. Cardiovascular Pedal pulses  palpable and strong bilaterally.. General Notes: Wound exam; the patient has 2 linear areas on both heels. Both have depth and the left has odor and tenderness. Both required debridement with a #5 curet removing necrotic tissue, skin and subcutaneous tissue. Post debridement the beds of the wounds appear to be healthy. Hemostasis with direct pressure. He tolerated this marginally due to pain Integumentary (Hair, Skin) Wound #3 status is Open. Original cause of wound was Pressure Injury. The wound is located on the Left Calcaneus. The wound measures 0.4cm length x 1.3cm width x 0.6cm depth; 0.408cm^2 area and 0.245cm^3 volume. There is fat exposed. There is no tunneling noted, however, there is undermining starting at 12:00 and ending at 12:00 with a maximum distance of 0.6cm. There is a large amount of purulent drainage noted. The wound margin is flat and intact. There is no granulation within the wound bed. There is a large (67-100%) amount of necrotic tissue within the wound bed including Eschar and Adherent Slough. The periwound skin appearance exhibited: Dry/Scaly, Moist. The periwound skin appearance did not exhibit: Callus, Crepitus, Excoriation, Fluctuance, Friable, Induration, Localized Edema, Rash, Scarring, Maceration, Atrophie Blanche, Cyanosis, Ecchymosis, Hemosiderin Staining, Mottled, Pallor, Rubor, Erythema. Periwound temperature was noted as No Abnormality. The periwound has tenderness on palpation. Wound #4 status is Open. Original cause of wound was Pressure Injury. The wound is located on the Right Calcaneus. The wound measures 0.1cm length x 0.1cm width x 0.1cm depth; 0.008cm^2 area and 0.001cm^3 volume. There is fat exposed. There is no tunneling or undermining noted. There is a medium amount of serosanguineous drainage noted. The wound margin is flat and intact. There is no granulation within the wound bed. There is a large (67-100%) amount of necrotic tissue within the  wound bed including Eschar. The periwound skin appearance exhibited: Dry/Scaly, Moist. The periwound skin appearance did not exhibit: Callus, Crepitus, Excoriation, Fluctuance, Friable, Induration, Localized Edema, Rash, Scarring, Maceration, Atrophie Blanche, Cyanosis, Ecchymosis, Hemosiderin Staining, Mottled, Pallor, Rubor, Erythema. Periwound temperature was noted as No Abnormality. The periwound has tenderness on palpation. Wound #5 status is Open. Original cause of wound was Shear/Friction. The wound is located on the Right,Dorsal Foot. The wound measures 9cm length x 3.5cm width x 0.1cm depth; 24.74cm^2 area and 2.474cm^3 volume. The wound is  limited to skin breakdown. There is no tunneling or undermining noted. There is a large amount of purulent drainage noted. The wound margin is distinct with the outline attached to the wound base. There is large (67-100%) pink granulation within the wound bed. There is a small (1-33%) amount of necrotic tissue within the wound bed including Adherent Slough. The periwound skin appearance exhibited: Maceration, Moist. Periwound temperature was noted as No Abnormality. The Levett, [place] ([id number]) periwound has tenderness on palpation. Wound #6 status is Open. Original cause of wound was Shear/Friction. The wound is located on the Left,Dorsal Foot. The wound measures 1.5cm length x 1cm width x 0.1cm depth; 1.178cm^2 area and 0.118cm^3 volume. The wound is limited to skin breakdown. There is no tunneling or undermining noted. There is a medium amount of serosanguineous drainage noted. The wound margin is distinct with the outline attached to the wound base. There is large (67-100%) pink granulation within the wound bed. There is a small (1-33%) amount of necrotic tissue within the wound bed including Adherent Slough. The periwound skin appearance exhibited: Moist. Periwound temperature was noted as No Abnormality. The periwound has tenderness on  palpation. 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 There was a Skin/Subcutaneous Tissue Debridement [id number]) debridement with total area of 0.01 sq cm performed by [name], MD. with the following instrument(s): Curette to remove Viable and Non-Viable tissue/material including Exudate, Fibrin/Slough, and Subcutaneous after achieving pain control using Lidocaine 4% Topical Solution. A time out was conducted at 15:52, prior to the start of the procedure. A Minimum amount of bleeding was controlled with Pressure. The procedure was tolerated well with a pain level of 0 throughout and a pain level of 0 following the procedure. Post Debridement Measurements: 0.6cm length x 2cm width x 0.2cm depth; 0.188cm^3 volume. Post debridement Stage noted as Category/Stage III. Character of Wound/Ulcer Post Debridement requires further debridement. Severity of Tissue Post Debridement is: Fat layer exposed. Post procedure Diagnosis Wound #4: Same as Pre-Procedure Wound #6 Wound #6 is a Trauma, Other located on the Left,Dorsal Foot . There was a Skin/Subcutaneous Tissue Debridement [id number]) debridement with total area of 1.5 sq cm performed by [name], MD. with the following instrument(s): Curette to remove Viable and Non-Viable tissue/material including Exudate, Fibrin/Slough, and Subcutaneous after achieving pain control using Lidocaine 4% Topical Jarrells, Yaxiel ([id number]) Solution. A time out was conducted at 15:52, prior to the start of the procedure. A Minimum amount of bleeding was controlled with Pressure. The procedure  was tolerated well with a pain level of 0 throughout and a pain level of 0 following the procedure. Post Debridement Measurements: 1.5cm length x 1cm width x 0.1cm depth; 0.118cm^3 volume. Character of Wound/Ulcer Post Debridement requires further debridement. Severity of Tissue Post Debridement is: Fat layer exposed. Post procedure Diagnosis Wound #6: Same as Pre-Procedure Plan Wound Cleansing: Wound #3 Left Calcaneus: Clean wound with Normal Saline. Cleanse wound with mild soap and water - HHRN please was feet and legs with mild soap and water and dry. Wound #4 Right Calcaneus: Clean wound with Normal Saline. Cleanse wound with mild soap and water - HHRN please  was feet and legs with mild soap and water and dry. Anesthetic: Wound #3 Left Calcaneus: Topical Lidocaine 4% cream applied to wound bed prior to debridement - office use only Wound #4 Right Calcaneus: Topical Lidocaine 4% cream applied to wound bed prior to debridement - office use only Skin Barriers/Peri-Wound Care: Wound #3 Left Calcaneus: Moisturizing lotion - Please put lotion on pts legs and feet***do not put on wound or between toes*** Wound #4 Right Calcaneus: Moisturizing lotion - Please put lotion on pts legs and feet***do not put on wound or between toes*** Primary Wound Dressing: Wound #3 Left Calcaneus: Aquacel Ag - rope--pack lightly into wound and into undermining Wound #4 Right Calcaneus: Aquacel Ag - rope--pack lightly into wound and into undermining Wound #5 Right,Dorsal Foot: Aquacel Ag Wound #6 Left,Dorsal Foot: Aquacel Ag Secondary Dressing: Wound #3 Left Calcaneus: Dry Gauze Conform/Kerlix - tape, netting Foam - heel cups to heels Wound #4 Right Calcaneus: Dry Gauze Holthaus, Elvis ([id number]) Conform/Kerlix - tape, netting Foam - heel cups to heels Wound #5 Right,Dorsal Foot: ABD pad Conform/Kerlix - tape, netting Wound #6 Left,Dorsal Foot: ABD pad Conform/Kerlix - tape,  netting Dressing Change Frequency: Wound #3 Left Calcaneus: Change dressing every other day. Wound #4 Right Calcaneus: Change dressing every other day. Follow-up Appointments: Wound #3 Left Calcaneus: Return Appointment in 1 week. Wound #4 Right Calcaneus: Return Appointment in 1 week. Wound #5 Right,Dorsal Foot: Return Appointment in 1 week. Wound #6 Left,Dorsal Foot: Return Appointment in 1 week. Off-Loading: Wound #3 Left Calcaneus: Heel suspension boot to: - pt to wear sage boots, please float heels Turn and reposition every 2 hours Other: - [place]oyer Lift Wound #4 Right Calcaneus: Heel suspension boot to: - pt to wear sage boots, please float heels Turn and reposition every 2 hours Other: - [place]oyer Lift Wound #5 Right,Dorsal Foot: Heel suspension boot to: - pt to wear sage boots, please float heels Turn and reposition every 2 hours Other: - Hoyer Lift Wound #6 Left,Dorsal Foot: Heel suspension boot to: - pt to wear sage boots, please float heels Turn and reposition every 2 hours Other: - Advertising account planner Orders / Instructions: Wound #3 Left Calcaneus: Increase protein intake. Wound #4 Right Calcaneus: Increase protein intake. Wound #5 Right,Dorsal Foot: Increase protein intake. Wound #6 Left,Dorsal Foot: Increase protein intake. Home Health: Wound #3 Left Calcaneus: Continue Home Health Visits - [place] ([id number]) Home Health Nurse may visit PRN to address patient s wound care needs. FACE TO FACE ENCOUNTER: MEDICARE and MEDICAID PATIENTS: I certify that this patient is under my care and that I had a face-to-face encounter that meets the physician face-to-face encounter requirements with this patient on this date. The encounter with the patient was in whole or in part for the following MEDICAL CONDITION: (primary reason for [place]) MEDICAL NECESSITY: I certify, that based on my findings, NURSING services are a medically necessary  home health service. HOME BOUND STATUS: I certify that my clinical findings support that this patient is homebound (i.e., Due to illness or injury, pt requires aid of supportive devices such as crutches, cane, wheelchairs, walkers, the use of special transportation or the assistance of another person to leave their place of residence. There is a normal inability to leave the home and doing so requires considerable and taxing effort. Other absences are for medical reasons / religious services and are infrequent or of short duration when for other reasons). If current dressing causes regression in wound condition,  may D/C ordered dressing product/s and apply Normal Saline Moist Dressing daily until next [place] / Other MD appointment. [place] of regression in wound condition at [phone]. Please direct any NON-WOUND related issues/requests for orders to patient's Primary Care Physician Wound #4 Right Calcaneus: [place] Nurse may visit PRN to address patient s wound care needs. FACE TO FACE ENCOUNTER: MEDICARE and MEDICAID PATIENTS: I certify that this patient is under my care and that I had a face-to-face encounter that meets the physician face-to-face encounter requirements with this patient on this date. The encounter with the patient was in whole or in part for the following MEDICAL CONDITION: (primary reason for [place]) MEDICAL NECESSITY: I certify, that based on my findings, NURSING services are a medically necessary home health service. HOME BOUND STATUS: I certify that my clinical findings support that this patient is homebound (i.e., Due to illness or injury, pt requires aid of supportive devices such as crutches, cane, wheelchairs, walkers, the use of special transportation or the assistance of another person to leave their place of residence. There is a normal inability to leave the home and  doing so requires considerable and taxing effort. Other absences are for medical reasons / religious services and are infrequent or of short duration when for other reasons). If current dressing causes regression in wound condition, may D/C ordered dressing product/s and apply Normal Saline Moist Dressing daily until next [place] / Other MD appointment. [place] of regression in wound condition at [phone]. Please direct any NON-WOUND related issues/requests for orders to patient's Primary Care Physician Wound #5 Right,Dorsal Foot: [place] Visits - [place] Nurse may visit PRN to address patient s wound care needs. FACE TO FACE ENCOUNTER: MEDICARE and MEDICAID PATIENTS: I certify that this patient is under my care and that I had a face-to-face encounter that meets the physician face-to-face encounter requirements with this patient on this date. The encounter with the patient was in whole or in part for the following MEDICAL CONDITION: (primary reason for [place]) MEDICAL NECESSITY: I certify, that based on my findings, NURSING services are a medically necessary home health service. HOME BOUND STATUS: I certify that my clinical findings support that this patient is homebound (i.e., Due to illness or injury, pt requires aid of supportive devices such as crutches, cane, wheelchairs, walkers, the use of special transportation or the assistance of another person to leave their place of residence. There is a normal inability to leave the home and doing so requires considerable and taxing effort. Other absences are for medical reasons / religious services and are infrequent or of short duration when for other reasons). If current dressing causes regression in wound condition, may D/C ordered dressing product/s and apply Normal Saline Moist Dressing daily until next [place] / Other MD appointment. [place] of regression in wound condition at [phone]. Please direct any NON-WOUND related issues/requests for orders to patient's Primary Care Physician Wound #6 Left,Dorsal Foot: [place] Visits - [place] ([id number]) [place] Nurse may visit PRN to address patient s wound care needs. FACE TO FACE ENCOUNTER: MEDICARE and MEDICAID PATIENTS: I certify that this patient is under my care and that I had a face-to-face encounter that meets the physician face-to-face encounter requirements with this patient on this date. The encounter with the patient was  in whole or in part for the following MEDICAL CONDITION: (primary reason for Home Healthcare) MEDICAL NECESSITY: I certify, that based on my findings, NURSING services are a medically necessary home health service. HOME BOUND STATUS: I certify that my clinical findings support that this patient is homebound (i.e., Due to illness or injury, pt requires aid of supportive devices such as crutches, cane, wheelchairs, walkers, the use of special transportation or the assistance of another person to leave their place of residence. There is a normal inability to leave the home and doing so requires considerable and taxing effort. Other absences are for medical reasons / religious services and are infrequent or of short duration when for other reasons). If current dressing causes regression in wound condition, may D/C ordered dressing product/s and apply Normal Saline Moist Dressing daily until next [place] / Other MD appointment. [place] of regression in wound condition at [phone]. Please direct any NON-WOUND related issues/requests for orders to patient's Primary Care Physician Medications-please add to medication list.: Wound #3 Left Calcaneus: P.O. Antibiotics - Cefinir Other: - Vitamin C, Zinc, Multivitamin Wound #4 Right Calcaneus: Other: - Vitamin C,  Zinc, Multivitamin Wound #5 Right,Dorsal Foot: Other: - Vitamin C, Zinc, Multivitamin Wound #6 Left,Dorsal Foot: Other: - Vitamin C, Zinc, Multivitamin #1 I'm going to continue with the silver alginate/Aquacel Ag. In the group home where this patient lives he has home health coming in. #2 the intermediate sensitivity of the Proteus dictated changing to an oral third generation cephalosporin and I chose Cefdinir 300 mg bid for 10 DAYS #3 the excoriations on the top of his foot bilaterally are considerably better. And I think this thing was caused by his attendant described as constant rubbing of the heel on the dorsal opposite foot by using body boots the areas on the dorsal foot appear to be considerably better Electronic Signature(s) Signed: 10/16/2016 7:51:03 AM By: Gretta Cool RN, BSN, Kim RN, BSN Signed: 10/17/2016 2:36:16 AM By: Linton Ham MD Previous Signature: 10/15/2016 4:39:43 PM Version By: Linton Ham MD Entered By: Gretta Cool, RN, BSN, Kim on 10/16/2016 07:51:03 Thornwood, Meshulem ([id number]) ESTER, LINNEMAN ([id number]) -------------------------------------------------------------------------------- SuperBill Details Patient Name: [name] Date of Service: 10/15/2016 Medical Record Patient Account Number: [id number] Number: Treating RN: Ahmed Prima 10/30/1956 (60 y.o. 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 Pressure ulcer of right heel, stage 3 L89.623 Pressure ulcer of left heel, stage 3 Facility Procedures CPT4 Code: [id number] Description: [id number] - DEB SUBQ TISSUE 20 SQ CM/<  ICD-10 Description Diagnosis L89.613 Pressure ulcer of right heel, stage 3 L89.623 Pressure ulcer of left heel, stage 3 Modifier: Quantity: 1 Physician Procedures CPT4 Code: [id number] Description: 11042 - WC PHYS SUBQ TISS 20 SQ CM ICD-10 Description Diagnosis L89.613 Pressure ulcer of right heel, stage 3 L89.623 Pressure ulcer of left heel, stage 3 Modifier: Quantity: 1 Electronic Signature(s) Signed: 10/15/2016 4:39:43 PM By: Linton Ham MD Entered By: Linton Ham on 10/15/2016 16:09:09

## 2016-10-22 ENCOUNTER — Encounter: Payer: Medicare Other | Admitting: Internal Medicine

## 2016-10-22 DIAGNOSIS — I70234 Atherosclerosis of native arteries of right leg with ulceration of heel and midfoot: Secondary | ICD-10-CM | POA: Diagnosis not present

## 2016-10-23 NOTE — Progress Notes (Addendum)
TRESEAN, TRAME ([id number]) Visit Report for 10/22/2016 Arrival Information Details Patient Name: BOW, ADER Date of Service: 10/22/2016 3:45 PM Medical Record Number: [id number] Patient Account Number: [id number] Date of Birth/Sex: 1956/10/25 (60 y.o. Male) Treating RN: [name] Primary Care Physician: [name] Other Clinician: Referring Physician: [name] Treating Physician/Extender: [name] in Treatment: 28 Visit Information History Since Last Visit Added or deleted any medications: No Patient Arrived: Wheel Chair Any new allergies or adverse reactions: No Arrival Time: 15:39 Had a fall or experienced change in No activities of daily living that may affect Accompanied By: staff risk of falls: Transfer Assistance: Manual Signs or symptoms of abuse/neglect since last No Patient Identification Verified: Yes visito Secondary Verification Process Yes Hospitalized since last visit: No Completed: Has Dressing in Place as Prescribed: Yes Patient Requires Transmission-Based No Pain Present Now: No Precautions: Patient Has Alerts: No Electronic Signature(s) Signed: 10/22/2016 6:00:24 PM By: [name] Entered By: [name] on 10/22/2016 15:44:26 Montejano, [name] ([id number]) -------------------------------------------------------------------------------- Encounter Discharge Information Details Patient Name: [name] Date of Service: 10/22/2016 3:45 PM Medical Record Number: [id number] Patient Account Number: [id number] Date of Birth/Sex: 08/01/1956 (60 y.o. Male) Treating RN: [name] Primary Care Physician: [name] Other Clinician: Referring Physician: [name] Treating Physician/Extender: [name] in Treatment: 28 Encounter Discharge Information Items Discharge Pain Level: 0 Discharge Condition: Stable Ambulatory Status: Wheelchair Discharge Destination: Home Transportation: Private  Auto Accompanied By: staff Schedule Follow-up Appointment: Yes Medication Reconciliation completed and provided to Patient/Care No Qaadir Kent: Provided on Clinical Summary of Care: 10/22/2016 Form Type Recipient Paper Patient DG Electronic Signature(s) Signed: 10/23/2016 8:28:06 AM By: [name] Previous Signature: 10/22/2016 4:48:38 PM Version By: [name] Entered By: [name] on 10/23/2016 08:28:06 [name] ([id number]) -------------------------------------------------------------------------------- Lower Extremity Assessment Details Patient Name: [name] Date of Service: 10/22/2016 3:45 PM Medical Record Number: [id number] Patient Account Number: [id number] Date of Birth/Sex: Apr 30, 1956 (60 y.o. Male) Treating RN: [name] Primary Care Physician: [name] Other Clinician: Referring Physician: [name] Treating Physician/Extender: [name] in Treatment: 28 Edema Assessment Assessed: [Left: No] [Right: No] Edema: [Left: No] [Right: No] Vascular Assessment Pulses: Posterior Tibial Dorsalis Pedis Palpable: [Left:Yes] [Right:Yes] Extremity colors, hair growth, and conditions: Extremity Color: [Left:Hyperpigmented] [Right:Hyperpigmented] Hair Growth on Extremity: [Left:No] [Right:No] Temperature of Extremity: [Left:Warm] [Right:Warm] Capillary Refill: [Left:< 3 seconds] [Right:< 3 seconds] Electronic Signature(s) Signed: 10/22/2016 6:00:24 PM By: [name] Entered By: [name] on 10/22/2016 15:56:30 Andreatta, [name] ([id number]) -------------------------------------------------------------------------------- Multi Wound Chart Details Patient Name: [name] Date of Service: 10/22/2016 3:45 PM Medical Record Number: [id number] Patient Account Number: [id number] Date of Birth/Sex: [birth year] (60 y.o. 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 Trauma, Other Secondary Etiology: Pressure Ulcer Pressure Ulcer N/A Comorbid History: Anemia Anemia Anemia Date Acquired: 02/07/2016 02/07/2016 10/08/2016 Weeks of Treatment: 28 28 2  Wound Status: Open Open Open Measurements L x W x D 0.4x1.1x0.4 0.6x2.4x0.1 0.1x0.1x0.1 (cm) Area (cm) : 0.346 1.131 0.008 Volume (cm) : 0.138 0.113 0.001 % Reduction in Area: 98.40% 91.00% 100.00% % Reduction in Volume: 93.50% 91.00% 100.00% Classification: Full Thickness Without Full Thickness  Without Partial Thickness Exposed Support Exposed Support Structures Structures Exudate Amount: Large Medium Large Exudate Type: Purulent Serosanguineous Purulent Exudate Color: yellow, brown, Barreira red, brown yellow, brown, Glasper Foul Odor After No Yes No Cleansing: Odor Anticipated Due to N/A No N/A Product Use: Elenes, Justus ([id number]) Wound Margin: Flat and Intact Flat and Intact Distinct, outline attached Granulation Amount: None Present (0%) None Present (0%) Large (67-100%) Granulation Quality: N/A N/A Pink Necrotic Amount: Large (67-100%) Large (67-100%) Small (1-33%) Necrotic Tissue: Eschar, Adherent Slough Eschar Adherent Slough Exposed Structures: Fat: Yes Fat: Yes Fascia: No Fascia: No Fascia: No Fat: No Tendon: No Tendon: No Tendon: No Muscle: No Muscle: No Muscle: No Joint: No Joint: No Joint: No Bone: No Bone: No Bone: No Limited to Skin Breakdown Epithelialization: Small (1-33%) Small (1-33%)  Large (67-100%) Periwound Skin Texture: Edema: No Edema: No No Abnormalities Noted Excoriation: No Excoriation: No Induration: No Induration: No Callus: No Callus: No Crepitus: No Crepitus: No Fluctuance: No Fluctuance: No Friable: No Friable: No Rash: No Rash: No Scarring: No Scarring: No Periwound Skin Moist: Yes Moist: Yes Maceration: Yes Moisture: Dry/Scaly: Yes Dry/Scaly: Yes Moist: Yes Maceration: No Maceration: No Periwound Skin Color: Atrophie Blanche: No Atrophie Blanche: No No Abnormalities Noted Cyanosis: No Cyanosis: No Ecchymosis: No Ecchymosis: No Erythema: No Erythema: No Hemosiderin Staining: No Hemosiderin Staining: No Mottled: No Mottled: No Pallor: No Pallor: No Rubor: No Rubor: No Temperature: No Abnormality No Abnormality No Abnormality Tenderness on Yes Yes Yes Palpation: Wound Preparation: Ulcer Cleansing: Ulcer Cleansing: Ulcer Cleansing: Rinsed/Irrigated with Rinsed/Irrigated with Rinsed/Irrigated with Saline Saline Saline Topical Anesthetic Topical Anesthetic Topical Anesthetic Applied: Other: lidocaine Applied: Other: lidocaine Applied: Other: lidocaine 4% 4% 4% Wound Number: 6 N/A N/A Photos: N/A N/A Spayd, [name] ([id number]) Wound Location: Left Foot - Dorsal N/A N/A Wounding Event: Shear/Friction N/A N/A Primary Etiology: Trauma, Other N/A N/A Secondary Etiology: N/A N/A N/A Comorbid History: Anemia N/A N/A Date Acquired: 10/08/2016 N/A N/A Weeks of Treatment: 2 N/A N/A Wound Status: Open N/A N/A Measurements L x W x D 0.1x0.1x0.1 N/A N/A (cm) Area (cm) : 0.008 N/A N/A Volume (cm) : 0.001 N/A N/A % Reduction in Area: 99.50% N/A N/A % Reduction in Volume: 99.40% N/A N/A Classification: Partial Thickness N/A N/A Exudate Amount: Medium N/A N/A Exudate Type: Serosanguineous N/A N/A Exudate Color: red, brown N/A N/A Foul Odor After No N/A N/A Cleansing: Odor Anticipated Due to N/A N/A N/A Product Use: Wound  Margin: Distinct, outline attached N/A N/A Granulation Amount: Large (67-100%) N/A N/A Granulation Quality: Pink N/A N/A Necrotic Amount: Small (1-33%) N/A N/A Necrotic Tissue: Adherent Slough N/A N/A Exposed Structures: Fascia: No N/A N/A Fat: No Tendon: No Muscle: No Joint: No Bone: No Limited to Skin Breakdown Epithelialization: Large (67-100%) N/A N/A Periwound Skin Texture: No Abnormalities Noted N/A N/A Periwound Skin Moist: Yes N/A N/A Moisture: Periwound Skin Color: No Abnormalities Noted N/A N/A Temperature: No Abnormality N/A N/A Tenderness on Yes N/A N/A Palpation: Wound Preparation: Ulcer Cleansing: N/A N/A Rinsed/Irrigated with Saline Topical Anesthetic Cancel, Gram ([id number]) Applied: Other: lidocaine 4% Treatment Notes Electronic Signature(s) Signed: 10/22/2016 6:00:24 PM By: [name] Entered By: [name] on 10/22/2016 15:59:16 [name] ([id number]) -------------------------------------------------------------------------------- Multi-Disciplinary Care Plan Details Patient Name: [name] Date of Service: 10/22/2016 3:45 PM Medical Record Number: [id number] Patient Account Number: [id number] Date of Birth/Sex: 10-10-1956 (60 y.o. Male) Treating RN: [name] Primary Care Physician: [name] Other Clinician: Referring Physician: [name] Treating Physician/Extender: [name]  in Treatment: 28 Active Inactive Abuse / Safety / Falls / Self Care Management Nursing Diagnoses: Potential for falls Goals: Patient will remain injury free Date Initiated: 04/08/2016 Goal Status: Active Interventions: Assess fall risk on admission and as needed Notes: Nutrition Nursing Diagnoses: Imbalanced nutrition Goals: Patient/caregiver agrees to and verbalizes understanding of need to use nutritional supplements and/or vitamins as prescribed Date Initiated: 04/08/2016 Goal Status:  Active Interventions: Assess patient nutrition upon admission and as needed per policy Notes: Orientation to the Wound Care Program Nursing Diagnoses: Knowledge deficit related to the wound healing center program Goals: Patient/caregiver will verbalize understanding of the [place] ([id number]) Date Initiated: 04/08/2016 Goal Status: Active Interventions: Provide education on orientation to the wound center Notes: Pain, Acute or Chronic Nursing Diagnoses: Pain, acute or chronic: actual or potential Potential alteration in comfort, pain Goals: Patient will verbalize adequate pain control and receive pain control interventions during procedures as needed Date Initiated: 04/08/2016 Goal Status: Active Interventions: Assess comfort goal upon admission Complete pain assessment as per visit requirements Notes: Pressure Nursing Diagnoses: Knowledge deficit related to causes and risk factors for pressure ulcer development Knowledge deficit related to management of pressures ulcers Goals: Patient will remain free from development of additional pressure ulcers Date Initiated: 04/08/2016 Goal Status: Active Interventions: Assess offloading mechanisms upon admission and as needed Assess potential for pressure ulcer upon admission and as needed Notes: Wound/Skin Impairment Nursing Diagnoses: Impaired tissue integrity Vazguez, Cordai ([id number]) Goals: Ulcer/skin breakdown will have a volume reduction of 30% by week 4 Date Initiated: 04/08/2016 Goal Status: Active Ulcer/skin breakdown will have a volume reduction of 50% by week 8 Date Initiated: 04/08/2016 Goal Status: Active Ulcer/skin breakdown will have a volume reduction of 80% by week 12 Date Initiated: 04/08/2016 Goal Status: Active Interventions: Assess ulceration(s) every visit Notes: Electronic Signature(s) Signed: 10/22/2016 6:00:24 PM By: [name] Entered By: [name] on  10/22/2016 15:59:10 Loth, [name] ([id number]) -------------------------------------------------------------------------------- Pain Assessment Details Patient Name: [name] Date of Service: 10/22/2016 3:45 PM Medical Record Number: [id number] Patient Account Number: [id number] Date of Birth/Sex: 1956-02-20 (60 y.o. Male) Treating RN: [name] Primary Care Physician: [name] Other Clinician: Referring Physician: [name] Treating Physician/Extender: [name] in Treatment: 28 Active Problems Location of Pain Severity and Description of Pain Patient Has Paino No Site Locations Pain Management and Medication Current Pain Management: Notes Topical or injectable lidocaine is offered to patient for acute pain when surgical debridement is performed. If needed, Patient is instructed to use over the counter pain medication for the following 24-48 hours after debridement. Wound care MDs do not prescribed pain medications. Patient has chronic pain or uncontrolled pain. Patient has been instructed to make an appointment with their Primary Care Physician for pain management. Electronic Signature(s) Signed: 10/22/2016 6:00:24 PM By: [name] Entered By: [name] on 10/22/2016 15:44:34 [name] ([id number]) -------------------------------------------------------------------------------- Patient/Caregiver Education Details Patient Name: [name] Date of Service: 10/22/2016 3:45 PM Medical Record Number: [id number] Patient Account Number: [id number] Date of Birth/Gender: 10/17/1956 (60 y.o. Male) Treating RN: [name] Primary Care Physician: [name] Other Clinician: Referring Physician: [name] Treating Physician/Extender: [name] in Treatment: 28 Education Assessment Education Provided To: Patient and Caregiver Education Topics Provided Wound/Skin Impairment: Handouts: Other: wound care and  offloading Methods: Explain/Verbal Responses: State content correctly Electronic Signature(s) Signed: 10/22/2016 6:00:24 PM By: [name] Entered By: [name] on 10/22/2016 16:48:59 Czerwinski, Youssouf ([id number]) -------------------------------------------------------------------------------- Wound Assessment Details Patient Name:  Nurse, Wanya Date of Service: 10/22/2016 3:45 PM Medical Record Number: [id number] Patient Account Number: [id number] Date of Birth/Sex: [birth year] (60 y.o. Male) Treating RN: [name] Primary Care Physician: [name] Other Clinician: Referring Physician: [name] Treating Physician/Extender: [name] in Treatment: 28 Wound Status Wound Number: 3 Primary Etiology: Arterial Insufficiency Ulcer Wound Location: Left Calcaneus Secondary Etiology: Pressure Ulcer Wounding Event: Pressure Injury Wound Status: Open Date Acquired: 02/07/2016 Comorbid History: Anemia Weeks Of Treatment: 28 Clustered Wound: No Photos Wound Measurements Length: (cm) 0.4 Width: (cm) 1.1 Depth: (cm) 0.4 Area: (cm) 0.346 Volume: (cm) 0.138 % Reduction in Area: 98.4% % Reduction in Volume: 93.5% Epithelialization: Small (1-33%) Tunneling: No Undermining: No Wound Description Full Thickness Without Exposed Classification: Support Structures Wound Margin: Flat and Intact Exudate Large Amount: Exudate Type: Purulent Exudate Color: yellow, brown, Kestenbaum Foul Odor After Cleansing: No Wound Bed Granulation Amount: None Present (0%) Exposed Structure Necrotic Amount: Large (67-100%) Fascia Exposed: No Necrotic Quality: Eschar, Adherent Slough Fat Layer Exposed: Yes Tendon Exposed: No Muscle Exposed: No Joint Exposed: No Bazzano, Davier ([id number]) Bone Exposed: No Periwound Skin Texture Texture Color No Abnormalities Noted: No No Abnormalities Noted: No Callus: No Atrophie Blanche: No Crepitus: No Cyanosis:  No Excoriation: No Ecchymosis: No Fluctuance: No Erythema: No Friable: No Hemosiderin Staining: No Induration: No Mottled: No Localized Edema: No Pallor: No Rash: No Rubor: No Scarring: No Temperature / Pain Moisture Temperature: No Abnormality No Abnormalities Noted: No Tenderness on Palpation: Yes Dry / Scaly: Yes Maceration: No Moist: Yes Wound Preparation Ulcer Cleansing: Rinsed/Irrigated with Saline Topical Anesthetic Applied: Other: lidocaine 4%, Treatment Notes Wound #3 (Left Calcaneus) 1. Cleansed with: Clean wound with Normal Saline 2. Anesthetic Topical Lidocaine 4% cream to wound bed prior to debridement 4. Dressing Applied: Aquacel Ag 5. Secondary Dressing Applied Dry Gauze Kerlix/Conform 7. Secured with Recruitment consultant) Signed: 10/22/2016 6:00:24 PM By: [name] Entered By: [name] on 10/22/2016 15:57:41 Rogness, Dionis ([id number]) -------------------------------------------------------------------------------- Wound Assessment Details Patient Name: [name] Date of Service: 10/22/2016 3:45 PM Medical Record Number: [id number] Patient Account Number: [id number] Date of Birth/Sex: 01-22-1956 (60 y.o. Male) Treating RN: [name] Primary Care Physician: [name] Other Clinician: Referring Physician: [name] Treating Physician/Extender: [name] in Treatment: 28 Wound Status Wound Number: 4 Primary Etiology: Arterial Insufficiency Ulcer Wound Location: Right Calcaneus Secondary Etiology: Pressure Ulcer Wounding Event: Pressure Injury Wound Status: Open Date Acquired: 02/07/2016 Comorbid History: Anemia Weeks Of Treatment: 28 Clustered Wound: No Photos Wound Measurements Length: (cm) 0.6 Width: (cm) 2.4 Depth: (cm) 0.1 Area: (cm) 1.131 Volume: (cm) 0.113 % Reduction in Area: 91% % Reduction in Volume: 91% Epithelialization: Small (1-33%) Tunneling: No Undermining:  No Wound Description Full Thickness Without Exposed Classification: Support Structures Wound Margin: Flat and Intact Exudate Medium Amount: Exudate Type: Serosanguineous Exudate Color: red, brown Foul Odor After Cleansing: Yes Due to Product Use: No Wound Bed Granulation Amount: None Present (0%) Exposed Structure Necrotic Amount: Large (67-100%) Fascia Exposed: No Necrotic Quality: Eschar Fat Layer Exposed: Yes Tendon Exposed: No Muscle Exposed: No Joint Exposed: No Radi, Sharod ([id number]) Bone Exposed: No Periwound Skin Texture Texture Color No Abnormalities Noted: No No Abnormalities Noted: No Callus: No Atrophie Blanche: No Crepitus: No Cyanosis: No Excoriation: No Ecchymosis: No Fluctuance: No Erythema: No Friable: No Hemosiderin Staining: No Induration: No Mottled: No Localized Edema: No Pallor: No Rash: No Rubor: No Scarring: No Temperature / Pain Moisture Temperature: No Abnormality No Abnormalities Noted: No Tenderness on  Palpation: Yes Dry / Scaly: Yes Maceration: No Moist: Yes Wound Preparation Ulcer Cleansing: Rinsed/Irrigated with Saline Topical Anesthetic Applied: Other: lidocaine 4%, Treatment Notes Wound #4 (Right Calcaneus) 1. Cleansed with: Clean wound with Normal Saline 2. Anesthetic Topical Lidocaine 4% cream to wound bed prior to debridement 4. Dressing Applied: Foam 5. Secondary Dressing Applied Kerlix/Conform 7. Secured with Recruitment consultant) Signed: 10/22/2016 6:00:24 PM By: [name] Entered By: [name] on 10/22/2016 15:58:20 Nanni, Breylen ([id number]) -------------------------------------------------------------------------------- Wound Assessment Details Patient Name: [name] Date of Service: 10/22/2016 3:45 PM Medical Record Number: [id number] Patient Account Number: [id number] Date of Birth/Sex: 05/05/1956 (60 y.o. Male) Treating RN: [name] Primary Care Physician: [name] Other Clinician: Referring Physician: [name] Treating Physician/Extender: [name] in Treatment: 28 Wound Status Wound Number: 5 Primary Etiology: Trauma, Other Wound Location: Right Foot - Dorsal Wound Status: Open Wounding Event: Shear/Friction Comorbid History: Anemia Date Acquired: 10/08/2016 Weeks Of Treatment: 2 Clustered Wound: No Photos Wound Measurements Length: (cm) 0.1 Width: (cm) 0.1 Depth: (cm) 0.1 Area: (cm) 0.008 Volume: (cm) 0.001 % Reduction in Area: 100% % Reduction in Volume: 100% Epithelialization: Large (67-100%) Tunneling: No Undermining: No Wound Description Classification: Partial Thickness Wound Margin: Distinct, outline attached Exudate Amount: Large Exudate Type: Purulent Exudate Color: yellow, brown, Scheff Foul Odor After Cleansing: No Wound Bed Granulation Amount: Large (67-100%) Exposed Structure Granulation Quality: Pink Fascia Exposed: No Necrotic Amount: Small (1-33%) Fat Layer Exposed: No Necrotic Quality: Adherent Slough Tendon Exposed: No Muscle Exposed: No Joint Exposed: No Bone Exposed: No Lyles, Marquavius ([id number]) Limited to Skin Breakdown Periwound Skin Texture Texture Color No Abnormalities Noted: No No Abnormalities Noted: No Moisture Temperature / Pain No Abnormalities Noted: No Temperature: No Abnormality Maceration: Yes Tenderness on Palpation: Yes Moist: Yes Wound Preparation Ulcer Cleansing: Rinsed/Irrigated with Saline Topical Anesthetic Applied: Other: lidocaine 4%, Treatment Notes Wound #5 (Right, Dorsal Foot) 1. Cleansed with: Clean wound with Normal Saline 2. Anesthetic Topical Lidocaine 4% cream to wound bed prior to debridement 4. Dressing Applied: Foam 5. Secondary Dressing Applied Kerlix/Conform 7. Secured with Recruitment consultant) Signed: 10/22/2016 6:00:24 PM By: [name] Entered By: [name] on 10/22/2016 15:58:41 Austin, Demontay  ([id number]) -------------------------------------------------------------------------------- Wound Assessment Details Patient Name: [name] Date of Service: 10/22/2016 3:45 PM Medical Record Number: [id number] Patient Account Number: [id number] Date of Birth/Sex: [birth year] (60 y.o. Male) Treating RN: [name] Primary Care Physician: [name] Other Clinician: Referring Physician: [name] Treating Physician/Extender: [name] in Treatment: 28 Wound Status Wound Number: 6 Primary Etiology: Trauma, Other Wound Location: Left Foot - Dorsal Wound Status: Open Wounding Event: Shear/Friction Comorbid History: Anemia Date Acquired: 10/08/2016 Weeks Of Treatment: 2 Clustered Wound: No Photos Wound Measurements Length: (cm) 0.1 Width: (cm) 0.1 Depth: (cm) 0.1 Area: (cm) 0.008 Volume: (cm) 0.001 % Reduction in Area: 99.5% % Reduction in Volume: 99.4% Epithelialization: Large (67-100%) Tunneling: No Undermining: No Wound Description Classification: Partial Thickness Wound Margin: Distinct, outline attached Exudate Amount: Medium Exudate Type: Serosanguineous Exudate Color: red, brown Foul Odor After Cleansing: No Wound Bed Granulation Amount: Large (67-100%) Exposed Structure Granulation Quality: Pink Fascia Exposed: No Necrotic Amount: Small (1-33%) Fat Layer Exposed: No Necrotic Quality: Adherent Slough Tendon Exposed: No Muscle Exposed: No Joint Exposed: No Bone Exposed: No Hosmer, Bowie ([id number]) Limited to Skin Breakdown Periwound Skin Texture Texture Color No Abnormalities Noted: No No Abnormalities Noted: No Moisture Temperature / Pain No Abnormalities Noted: No Temperature: No Abnormality Moist: Yes Tenderness on  Palpation: Yes Wound Preparation Ulcer Cleansing: Rinsed/Irrigated with Saline Topical Anesthetic Applied: Other: lidocaine 4%, Treatment Notes Wound #6 (Left, Dorsal Foot) 1. Cleansed  with: Clean wound with Normal Saline 2. Anesthetic Topical Lidocaine 4% cream to wound bed prior to debridement 4. Dressing Applied: Foam 5. Secondary Dressing Applied Kerlix/Conform 7. Secured with Recruitment consultant) Signed: 10/22/2016 6:00:24 PM By: [name] Entered By: [name] on 10/22/2016 15:59:01 [name] ([id number]) -------------------------------------------------------------------------------- Vitals Details Patient Name: [name] Date of Service: 10/22/2016 3:45 PM Medical Record Number: [id number] Patient Account Number: [id number] Date of Birth/Sex: [birth year] (60 y.o. Male) Treating RN: [name] Primary Care Physician: [name] Other Clinician: Referring Physician: [name] Treating Physician/Extender: [name] in Treatment: 28 Vital Signs Time Taken: 15:44 Temperature (F): 97.5 Height (in): 70 Pulse (bpm): 49 Respiratory Rate (breaths/min): 16 Blood Pressure (mmHg): 136/83 Reference Range: 80 - 120 mg / dl Electronic Signature(s) Signed: 10/22/2016 6:00:24 PM By: [name] Entered By: [name] on 10/22/2016 15:44:53

## 2016-10-23 NOTE — Progress Notes (Signed)
TYVEON, STAUDACHER ([id number]) Visit Report for 10/22/2016 Chief Complaint Document Details Patient Name: [name], [name] Date of Service: 10/22/2016 3:45 PM Medical Record Patient Account Number: [id number] Number: Treating RN: [name] 29-Sep-1956 (60 y.o. Other Clinician: Date of Birth/Sex: Male) Treating Deadrian Toya Primary Care Physician: [name] Physician/Extender: G Referring Physician: Claudie Revering in Treatment: 28 Information Obtained from: Patient Chief Complaint here for follow up on bilateral heel pressure ulcers Electronic Signature(s) Signed: 10/22/2016 4:39:00 PM By: Linton Ham MD Entered By: Linton Ham on 10/22/2016 16:28:45 [name] ([id number]) -------------------------------------------------------------------------------- Debridement Details Patient Name: [name] Date of Service: 10/22/2016 3:45 PM Medical Record Patient Account Number: [id number] Number: Treating RN: [name] 1955/12/12 (60 y.o. Other Clinician: Date of Birth/Sex: Male) Treating Jendaya Gossett, [place] Primary Care Physician: [name] Physician/Extender: G Referring Physician: Claudie Revering in Treatment: 28 Debridement Performed for Wound #4 Right Calcaneus Assessment: Performed By: Physician [name], MD Debridement: Open Wound/Selective Debridement Selective Description: Pre-procedure Yes - 15:56 Verification/Time Out Taken: Start Time: 15:56 Pain Control: Lidocaine 4% Topical Solution Level: Non-Viable Tissue Total Area Debrided (L x 0.6 (cm) x 2.4 (cm) = 1.44 (cm) W): Tissue and other Non-Viable, Exudate material debrided: Instrument: Curette Bleeding: None End Time: 15:59 Procedural Pain: 0 Post Procedural Pain: 0 Response to Treatment: Procedure was tolerated well Post Debridement Measurements of Total Wound Length: (cm) 0.6 Width: (cm) 2.4 Depth: (cm) 0.1 Volume: (cm)  0.113 Character of Wound/Ulcer Post Improved Debridement: Severity of Tissue Post Debridement: Limited to breakdown of skin Post Procedure Diagnosis Same as Pre-procedure Electronic Signature(s) Signed: 10/22/2016 4:39:00 PM By: Linton Ham MD [place], [name] ([id number]) Signed: 10/22/2016 6:00:24 PM By: [name] Entered By: Linton Ham on 10/22/2016 16:28:28 [name] ([id number]) -------------------------------------------------------------------------------- HPI Details Patient Name: [name] Date of Service: 10/22/2016 3:45 PM Medical Record Patient Account Number: [id number] Number: Treating RN: [name] 1956/11/06 (60 y.o. Other Clinician: Date of Birth/Sex: Male) Treating Salimah Martinovich Primary Care Physician: [name] Physician/Extender: G Referring Physician: Claudie Revering in Treatment: 28 History of Present Illness HPI Description: 04/08/16; this is a patient we really don't know too much about. He was hospitalized from 3/24 through 4/6 residing with a sodium of 167 creatinine of 2.49 right lower lobe pneumonia sepsis/septic shock syndrome. He apparently came back to the assisted living [facility] living [facility]. He apparently had been there only 2 weeks before he went out. I know very little about him premorbidly. The hospital he had a CT scan of the head that showed atrophy and chronic small vessel white matter ischemic changes and remote infarcts also noted within the bilateral basal ganglia. His echocardiogram and presentation showed a EF of 20-25%, left ventricle was moderately dilated with diffuse hypokinesis. Apparently he return to his assisted living with wounds on his bilateral feet. According to the attendant came with him he could walk before he went out he could no longer walk now. There are wounds on both heels covered with a black eschar and also wounds on his bilateral dorsal feet.  ABIs calculated in our clinic for 0.49 bilaterally. He was a smoker up to 2 months ago he is not smoking now. 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 Monophasic waves distally bilaterally. He is going back at the end of the month  I believe June 29 probably for an arteriogram as arranged by Dr. Delana Meyer in the meantime he has had no real changes 04/30/16; we are waiting vascular review on June 29. The black eschar over his bilateral heels is beginning to separate using Santyl. The areas on the dorsal aspect of his feet looked considerably better/resolved. He apparently has no complaints. He also has a severe presumably ischemic cardiomyopathy as noted above 05/21/16:pt returns today for ongoing evaluation of wounds. I reviewed recent venous and arterial diagnostic findings. He is scheduled for a LLE angio on 05/26/16 and RLE angio on 06/02/16 for advanced evaluation of PAD as noted per lower extremity arterial duplex. BLE venous duplex was negative for venous reflux, DVT or insufficiency. 06/10/16; this is a patient I have not seen in over a month although he was seen here on 7/12. When I admitted him to our clinic I felt he had pressure ulcerations but also potential for limb threatening ischemia. I referred him urgently to vascular surgery. Since we have last seen him in this clinic he has undergone 2 procedures. On 7/17 he will underwent percutaneous transluminal angioplasty of the left above-knee popliteal artery as well as the mid to distal SFA. On 7/27 he underwent for cutaneous transluminal angioplasty of the right popliteal artery and SFA an angioplasty of the proximal SFA. He had a stent placement in the right popliteal artery. The patient is difficult to gauge symptomatology although he does not currently complain of pain.  Our intake nurse reports purulent drainage out of the left heel and an odor. 06/17/2016 -- right heel x-ray and left heel x-ray -- IMPRESSION:No radiographic evidence of osteomyelitis. Soft tissue changes are Charland, Nomar ([id number]) consistent with cellulitis. He had a wound culture which is positive for abundant Citrobacter fundi and pseudomonas aeruginosa. Both are sensitive to ciprofloxacin and at the present time the patient is on doxycycline 100 mg, prescribed by Dr. Dellia Nims last week. I will prescribe ciprofloxacin for him today. 07/16/16 it is been quite a while since I've seen these wounds however both of them are quite a bit better. Using [place], being followed by [facility]. 07/30/16; I see this man every 2 weeks. He had ischemic wounds on his bilateral heels. He has been using Santyl with [facility] coming out to his facility 08/12/16 Patient comes in today for a follow-up visit we see him every 2 weeks at this point in time. He has bilateral heel wounds the we have been applying Santyl to and he has home health coming out to this facility for these dressing changes. These wounds seem to be improving dramatically since he had the procedures with vascular to stent and improve his blood flow in the bilateral lower extremities. Currently he has been responding well to debridement with Dr. Dellia Nims as well. He tells me at this point he really has no sniffing discomfort or pain at this time. He is tolerating the dressing changes well at this point as well. 08/26/16; patient comes in for his two-week follow-up. He has bilateral heel pressure ulcers that were complicated by severe ischemia now corrected. Has been using Santyl to these areas. 09/17/2016 - Mr. Friedland arrives today with an assistance from the facility that he resides at. Mr. Vigen offers no complaints, answering most questions "no" or "okay"; most information obtained from the assistant. Heel protectors are now  only being worn at night, not throughout the day and the patient has a history of resting heels directly on the  ground throughout the day versus on the foot rests of the wheelchair (R>L). Home health continues to provide dressing changes. The caregiver/attendant was encouraged to bring in heel protectors to next weeks visit for evaluation in addition, the patient and the attendant were encouraged to wear to heel protector at all times. Mr Allende remains non-ambulatory 10/08/16; patient arrives today in 3 week follow-up. When he was last seen here our staff and providers felt that he was well on his way to healing. He has home health changing the dressings. He arrives today with an odor coming out of the left heel that smelt like "rotten potatoes" according to our intake nurse. He is not been systemically unwell. The individual who accompanies him from the facility states that he uses his body boots at night but sits in the wheelchair rubbing his heal up and down on the dorsal aspect of the other foot. 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 Now status post revascularization. He had been doing quite nicely however last week presented with a very significant deterioration in the bilateral ulcers. Especially the area on the left had an older. CandS showed abundant Pseudomonas and Proteus. I empirically put him on ciprofloxacin although the Proteus was only intermediate in terms of its sensitivity. 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 Now status post revascularization he had been doing quite well and most of [place] felt these were going to heal however he developed recurrent trauma caused by rubbing his heels against the dorsal aspect of the other foot he subsequently developed an infection  with Proteus and Pseudomonas. I gave him Cipro however the protein was was only intermediately sensitive. I therefore have him on a 10 day course of Cefdinir Electronic Signature(s) Signed: 10/22/2016 4:39:00 PM By: Linton Ham MD Entered By: Linton Ham on 10/22/2016 16:30:17 [name] ([id number]) -------------------------------------------------------------------------------- Physical Exam Details Patient Name: [name] Date of Service: 10/22/2016 3:45 PM Medical Record Patient Account Number: [id number] Number: Treating RN: [name] [birth year] (60 y.o. Other Clinician: Date of Birth/Sex: Male) Treating Fredricka Kohrs Primary Care Physician: [name] Physician/Extender: G Referring Physician: Claudie Revering in Treatment: 28 Constitutional Sitting or standing Blood Pressure is within target range for patient.. Pulse regular and within target range for patient.Marland Kitchen Respirations regular, non-labored and within target range.. Temperature is normal and within the target range for the patient.. Patient's appearance is neat and clean. Appears in no acute distress. Well nourished and well developed.. Eyes . Notes Wound exam; the patient has 2 linear areas on both heels. The area on the right heel had surface eschar which I removed with the curette. Underneath this is only a very tiny area. The area on the left still has a wound with some depth but very little undermining. The base of the granulation here looks quite healthy. I am no longer concerned about the dorsal feet Electronic Signature(s) Signed: 10/22/2016 4:39:00 PM By: Linton Ham MD Entered By: Linton Ham on 10/22/2016 16:32:17 [name] ([id number]) -------------------------------------------------------------------------------- Physician Orders Details Patient Name: [name] Date of Service: 10/22/2016 3:45 PM Medical Record Patient Account Number:  [id number] Number: Treating RN: [name] [birth year] (60 y.o. Other Clinician: Date of Birth/Sex: Male) Treating Tavares Levinson Primary Care Physician: [name] Physician/Extender: G Referring Physician: [name] Weeks in Treatment:  28 Verbal / Phone Orders: Yes Clinician: [name] Read Back and Verified: Yes Diagnosis Coding Wound Cleansing Wound #3 Left Calcaneus o Clean wound with Normal Saline. o Cleanse wound with mild soap and water - HHRN please was feet and legs with mild soap and water and dry. Wound #4 Right Calcaneus o Clean wound with Normal Saline. o Cleanse wound with mild soap and water - HHRN please was feet and legs with mild soap and water and dry. Wound #5 Right,Dorsal Foot o Clean wound with Normal Saline. o Cleanse wound with mild soap and water - HHRN please was feet and legs with mild soap and water and dry. Wound #6 Left,Dorsal Foot o Clean wound with Normal Saline. o Cleanse wound with mild soap and water - HHRN please was feet and legs with mild soap and water and dry. Anesthetic Wound #3 Left Calcaneus o Topical Lidocaine 4% cream applied to wound bed prior to debridement - office use only Wound #4 Right Calcaneus o Topical Lidocaine 4% cream applied to wound bed prior to debridement - office use only Wound #5 Right,Dorsal Foot o Topical Lidocaine 4% cream applied to wound bed prior to debridement - office use only Wound #6 Left,Dorsal Foot o Topical Lidocaine 4% cream applied to wound bed prior to debridement - office use only Skin Barriers/Peri-Wound Care [place], Hisham ([id number]) Wound #3 Left Calcaneus o Moisturizing lotion - Please put lotion on pts legs and feet***do not put on wound or between toes*** Wound #4 Right Calcaneus o Moisturizing lotion - Please put lotion on pts legs and feet***do not put on wound or between toes*** Wound #5 Right,Dorsal Foot o Moisturizing  lotion - Please put lotion on pts legs and feet***do not put on wound or between toes*** Wound #6 Left,Dorsal Foot o Moisturizing lotion - Please put lotion on pts legs and feet***do not put on wound or between toes*** Primary Wound Dressing Wound #3 Left Calcaneus o Other: - foam Wound #5 Right,Dorsal Foot o Other: - foam Wound #6 Left,Dorsal Foot o Other: - foam Wound #4 Right Calcaneus o Aquacel Ag - rope--pack lightly into wound and into undermining Secondary Dressing Wound #3 Left Calcaneus o Dry Gauze o Conform/Kerlix - tape, netting o Foam - heel cups to heels Wound #4 Right Calcaneus o Dry Gauze o Conform/Kerlix - tape, netting o Foam - heel cups to heels Wound #5 Right,Dorsal Foot o Dry Gauze o Conform/Kerlix - tape, netting o Foam - heel cups to heels Wound #6 Left,Dorsal Foot o Dry Gauze o Conform/Kerlix - tape, netting o Foam - heel cups to heels Dressing Change Frequency Wound #3 Left Calcaneus Iverson, Chucky ([id number]) o Change dressing every other day. Wound #4 Right Calcaneus o Change dressing every other day. Wound #5 Right,Dorsal Foot o Change dressing every other day. Wound #6 Left,Dorsal Foot o Change dressing every other day. Follow-up Appointments Wound #3 Left Calcaneus o Return Appointment in 1 week. Wound #4 Right Calcaneus o Return Appointment in 1 week. Wound #5 Right,Dorsal Foot o Return Appointment in 1 week. Wound #6 Left,Dorsal Foot o Return Appointment in 1 week. Off-Loading Wound #3 Left Calcaneus o Heel suspension boot to: - pt to wear sage boots, please float heels o Turn and reposition every 2 hours o Other: - Hoyer Lift Wound #4 Right Calcaneus o Heel suspension boot to: - pt to wear sage boots, please float heels o Turn and reposition every 2 hours o Other: - Hoyer Lift Wound #5 Right,Dorsal Foot o  Heel suspension boot to: - pt to wear sage boots, please  float heels o Turn and reposition every 2 hours o Other: - Hoyer Lift Wound #6 Left,Dorsal Foot o Heel suspension boot to: - pt to wear sage boots, please float heels o Turn and reposition every 2 hours o Other: - Advertising account planner Orders / Instructions Wound #3 Left Calcaneus o Increase protein intake. Puccini, Tina ([id number]) Wound #4 Right Calcaneus o Increase protein intake. Wound #5 Right,Dorsal Foot o Increase protein intake. Wound #6 Left,Dorsal Foot o Increase protein intake. Home Health Wound #3 Left Calcaneus o Continue Home Health Visits - [place] Nurse may visit PRN to address patientos wound care needs. o FACE TO FACE ENCOUNTER: MEDICARE and MEDICAID PATIENTS: I certify that this patient is under my care and that I had a face-to-face encounter that meets the physician face-to-face encounter requirements with this patient on this date. The encounter with the patient was in whole or in part for the following MEDICAL CONDITION: (primary reason for [place]) MEDICAL NECESSITY: I certify, that based on my findings, NURSING services are a medically necessary home health service. HOME BOUND STATUS: I certify that my clinical findings support that this patient is homebound (i.e., Due to illness or injury, pt requires aid of supportive devices such as crutches, cane, wheelchairs, walkers, the use of special transportation or the assistance of another person to leave their place of residence. There is a normal inability to leave the home and doing so requires considerable and taxing effort. Other absences are for medical reasons / religious services and are infrequent or of short duration when for other reasons). o If current dressing causes regression in wound condition, may D/C ordered dressing product/s and apply Normal Saline Moist Dressing daily until next [place] / Other MD appointment. [place] of regression in wound condition at [phone]. o Please direct any NON-WOUND related issues/requests for orders to patient's Primary Care Physician Wound #4 Right Calcaneus o [place] Visits - [place] Nurse may visit PRN to address patientos wound care needs. o FACE TO FACE ENCOUNTER: MEDICARE and MEDICAID PATIENTS: I certify that this patient is under my care and that I had a face-to-face encounter that meets the physician face-to-face encounter requirements with this patient on this date. The encounter with the patient was in whole or in part for the following MEDICAL CONDITION: (primary reason for [place]) MEDICAL NECESSITY: I certify, that based on my findings, NURSING services are a medically necessary home health service. HOME BOUND STATUS: I certify that my clinical findings support that this patient is homebound (i.e., Due to illness or injury, pt requires aid of supportive devices such as crutches, cane, wheelchairs, walkers, the use of special transportation or the assistance of another person to leave their place of residence. There is a normal inability to leave the home and doing so requires considerable and taxing effort. Other absences are for medical reasons / religious services and are infrequent or of short duration when for other reasons). o If current dressing causes regression in wound condition, may D/C ordered dressing product/s and apply Normal Saline Moist Dressing daily until next [place] / Other MD appointment. [place] of regression in wound condition at [phone]. KOLBEY, ARCURI ([id number]) o Please direct any NON-WOUND related issues/requests for orders to patient's Primary Care Physician Wound #5 [place] Visits -  [place] Nurse may visit PRN to address patientos wound care needs. o  FACE TO FACE ENCOUNTER: MEDICARE and MEDICAID PATIENTS: I certify that this patient is under my care and that I had a face-to-face encounter that meets the physician face-to-face encounter requirements with this patient on this date. The encounter with the patient was in whole or in part for the following MEDICAL CONDITION: (primary reason for [place]) MEDICAL NECESSITY: I certify, that based on my findings, NURSING services are a medically necessary home health service. HOME BOUND STATUS: I certify that my clinical findings support that this patient is homebound (i.e., Due to illness or injury, pt requires aid of supportive devices such as crutches, cane, wheelchairs, walkers, the use of special transportation or the assistance of another person to leave their place of residence. There is a normal inability to leave the home and doing so requires considerable and taxing effort. Other absences are for medical reasons / religious services and are infrequent or of short duration when for other reasons). o If current dressing causes regression in wound condition, may D/C ordered dressing product/s and apply Normal Saline Moist Dressing daily until next [place] / Other MD appointment. [place] of regression in wound condition at [phone]. o Please direct any NON-WOUND related issues/requests for orders to patient's Primary Care Physician Wound #6 [place] Visits - [place] Nurse may visit PRN to address patientos wound care needs. o FACE TO FACE ENCOUNTER: MEDICARE and MEDICAID PATIENTS: I certify that this patient is under my care and that I had a face-to-face encounter that meets the physician face-to-face encounter requirements with this patient on this date. The encounter with the patient was in whole or in part for the following MEDICAL CONDITION: (primary reason for [place]) MEDICAL NECESSITY: I certify, that based on my findings, NURSING services are a medically necessary home health service. HOME BOUND STATUS: I certify that my clinical findings support that this patient is homebound (i.e., Due to illness or injury, pt requires aid of supportive devices such as crutches, cane, wheelchairs, walkers, the use of special transportation or the assistance of another person to leave their place of residence. There is a normal inability to leave the home and doing so requires considerable and taxing effort. Other absences are for medical reasons / religious services and are infrequent or of short duration when for other reasons). o If current dressing causes regression in wound condition, may D/C ordered dressing product/s and apply Normal Saline Moist Dressing daily until next [place] / Other MD appointment. [place] of regression in wound condition at [phone]. o Please direct any NON-WOUND related issues/requests for orders to patient's Primary Care Physician Medications-please add to medication list. Wound #3 Left Calcaneus o P.O. Antibiotics - Cefinir o Other: - Vitamin C, Zinc, Multivitamin Yglesias, Zakar ([id number]) Wound #4 Right Calcaneus o Other: - Vitamin C, Zinc, Multivitamin Wound #5 Right,Dorsal Foot o Other: - Vitamin C, Zinc, Multivitamin Wound #6 Left,Dorsal Foot o Other: - Vitamin C, Zinc, Multivitamin Electronic Signature(s) Signed: 10/22/2016 4:39:00 PM By: Linton Ham MD Signed: 10/22/2016 6:00:24 PM By: [name] Entered By: [name] on 10/22/2016 16:03:09 ANTONIA, DITTUS ([id number]) -------------------------------------------------------------------------------- Prescription 10/22/2016 Patient Name: [name] Physician: [name] MD Date of Birth: 12-Aug-1956 NPI#: [id number] Sex: M DEA#: [id number] Phone #: [phone] License #: [id number] Patient  Address: [facility] [place] [place], [place] [address] [facility] [address], [place], [place] [address] [phone] Allergies NKDA Physician's Orders P.O. Antibiotics - Cefinir Signature(s): Date(s): Electronic Signature(s) Signed: 10/22/2016 4:39:00 PM By: Linton Ham MD Entered By: Linton Ham on 10/22/2016 16:34:21 Paparella, [name] ([id number]) --------------------------------------------------------------------------------  Problem List Details Patient Name: [name] Date of Service: 10/22/2016 3:45 PM Medical Record Patient Account Number: [id number] Number: Treating RN: [name] 08/08/1956 (60 y.o. 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.245 Atherosclerosis of native arteries of left leg with ulceration 04/08/2016 Yes of other part of foot Inactive Problems Resolved Problems ICD-10 Code Description Active Date Resolved Date L89.610 Pressure ulcer of right heel, unstageable 04/08/2016 04/08/2016 L89.620 Pressure ulcer of left heel, unstageable 04/08/2016 04/08/2016 Electronic Signature(s) Signed: 10/22/2016 4:39:00 PM By: Linton Ham MD Entered By: Linton Ham on 10/22/2016 16:27:55 Vanasten, Kirt ([id number]) [name] ([id number]) -------------------------------------------------------------------------------- Progress Note Details Patient Name: [name] Date of Service: 10/22/2016 3:45 PM Medical Record  Patient Account Number: [id number] Number: Treating RN: [name] 1956-03-27 (60 y.o. Other Clinician: Date of Birth/Sex: Male) Treating Zuhayr Deeney Primary Care Physician: [name] Physician/Extender: G Referring Physician: Claudie Revering in Treatment: 28 Subjective Chief Complaint Information obtained from Patient here for follow up on bilateral heel pressure ulcers History of Present Illness (HPI) 04/08/16; this is a patient we really don't know too much about. He was hospitalized from 3/24 through 4/6 residing with a sodium of 167 creatinine of 2.49 right lower lobe pneumonia sepsis/septic shock syndrome. He apparently came back to the assisted living [facility] living [facility]. He apparently had been there only 2 weeks before he went out. I know very little about him premorbidly. The hospital he had a CT scan of the head that showed atrophy and chronic small vessel white matter ischemic changes and remote infarcts also noted within the bilateral basal ganglia. His echocardiogram and presentation showed a EF of 20-25%, left ventricle was moderately dilated with diffuse hypokinesis. Apparently he return to his assisted living with wounds on his bilateral feet. According to the attendant came with him he could walk before he went out he could no longer walk now. There are wounds on both heels covered with a black eschar and also wounds on his bilateral dorsal feet. ABIs calculated in our clinic for 0.49 bilaterally. He was a smoker up to 2 months ago he is not smoking now. 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 Monophasic waves distally bilaterally. He is going back at the end of the month I believe June 29 probably for an arteriogram as arranged by Dr. Delana Meyer  in the meantime he has had no real changes 04/30/16; we are waiting vascular review on June 29. The black eschar over his bilateral heels is beginning to separate using Santyl. The areas on the dorsal aspect of his feet looked considerably better/resolved. He apparently has no complaints. He  also has a severe presumably ischemic cardiomyopathy as noted above 05/21/16:pt returns today for ongoing evaluation of wounds. I reviewed recent venous and arterial diagnostic findings. He is scheduled for a LLE angio on 05/26/16 and RLE angio on 06/02/16 for advanced evaluation of PAD as noted per lower extremity arterial duplex. BLE venous duplex was negative for venous reflux, DVT or insufficiency. 06/10/16; this is a patient I have not seen in over a month although he was seen here on 7/12. When I admitted him to our clinic I felt he had pressure ulcerations but also potential for limb threatening ischemia. I referred him urgently to vascular surgery. Since we have last seen him in this clinic he has undergone 2 procedures. On 7/17 he will underwent percutaneous transluminal angioplasty of the left above-knee popliteal artery as well as the mid to distal SFA. On 7/27 he underwent for cutaneous transluminal angioplasty of the right popliteal artery and SFA an angioplasty of the proximal SFA. He had a stent Rhyner, Jaquavion ([id number]) placement in the right popliteal artery. The patient is difficult to gauge symptomatology although he does not currently complain of pain. Our intake nurse reports purulent drainage out of the left heel and an odor. 06/17/2016 -- right heel x-ray and left heel x-ray -- IMPRESSION:No radiographic evidence of osteomyelitis. Soft tissue changes are consistent with cellulitis. He had a wound culture which is positive for abundant Citrobacter fundi and pseudomonas aeruginosa. Both are sensitive to ciprofloxacin and at the present time the patient is on doxycycline 100 mg, prescribed by  Dr. Dellia Nims last week. I will prescribe ciprofloxacin for him today. 07/16/16 it is been quite a while since I've seen these wounds however both of them are quite a bit better. Using [place], being followed by [facility]. 07/30/16; I see this man every 2 weeks. He had ischemic wounds on his bilateral heels. He has been using Santyl with [facility] coming out to his facility 08/12/16 Patient comes in today for a follow-up visit we see him every 2 weeks at this point in time. He has bilateral heel wounds the we have been applying Santyl to and he has home health coming out to this facility for these dressing changes. These wounds seem to be improving dramatically since he had the procedures with vascular to stent and improve his blood flow in the bilateral lower extremities. Currently he has been responding well to debridement with Dr. Dellia Nims as well. He tells me at this point he really has no sniffing discomfort or pain at this time. He is tolerating the dressing changes well at this point as well. 08/26/16; patient comes in for his two-week follow-up. He has bilateral heel pressure ulcers that were complicated by severe ischemia now corrected. Has been using Santyl to these areas. 09/17/2016 - Mr. Lilly arrives today with an assistance from the facility that he resides at. Mr. Stmarie offers no complaints, answering most questions "no" or "okay"; most information obtained from the assistant. Heel protectors are now only being worn at night, not throughout the day and the patient has a history of resting heels directly on the ground throughout the day versus on the foot rests of the wheelchair (R>L). Home health continues to provide dressing changes. The caregiver/attendant was encouraged to bring in heel protectors to next weeks visit for evaluation in addition, the patient and the attendant were encouraged to wear to heel protector at all times. Mr Moody remains  non-ambulatory 10/08/16; patient arrives today in 3  week follow-up. When he was last seen here our staff and providers felt that he was well on his way to healing. He has home health changing the dressings. He arrives today with an odor coming out of the left heel that smelt like "rotten potatoes" according to our intake nurse. He is not been systemically unwell. The individual who accompanies him from the facility states that he uses his body boots at night but sits in the wheelchair rubbing his heal up and down on the dorsal aspect of the other foot. 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 Now status post revascularization. He had been doing quite nicely however last week presented with a very significant deterioration in the bilateral ulcers. Especially the area on the left had an older. CandS showed abundant Pseudomonas and Proteus. I empirically put him on ciprofloxacin although the Proteus was only intermediate in terms of its sensitivity. 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 Now status post revascularization he had been doing quite well and most of [place] felt these were going to heal however he developed recurrent trauma caused by rubbing his heels against the dorsal aspect of the other foot he subsequently developed an infection with Proteus and Pseudomonas. I gave him Cipro however the protein was was only intermediately sensitive. I therefore have him on a 10 day course of Cefdinir Cong, Iman ([id number]) Objective Constitutional Sitting or standing Blood Pressure is within target range for patient.. Pulse regular and within target range for patient.Marland Kitchen Respirations regular, non-labored and within target range.. Temperature is normal and within the target range for the  patient.. Patient's appearance is neat and clean. Appears in no acute distress. Well nourished and well developed.. Vitals Time Taken: 3:44 PM, Height: 70 in, Temperature: 97.5 F, Pulse: 49 bpm, Respiratory Rate: 16 breaths/min, Blood Pressure: 136/83 mmHg. General Notes: Wound exam; the patient has 2 linear areas on both heels. The area on the right heel had surface eschar which I removed with the curette. Underneath this is only a very tiny area. The area on the left still has a wound with some depth but very little undermining. The base of the granulation here looks quite healthy. I am no longer concerned about the dorsal feet Integumentary (Hair, Skin) Wound #3 status is Open. Original cause of wound was Pressure Injury. The wound is located on the Left Calcaneus. The wound measures 0.4cm length x 1.1cm width x 0.4cm depth; 0.346cm^2 area and 0.138cm^3 volume. There is fat exposed. There is no tunneling or undermining noted. There is a large amount of purulent drainage noted. The wound margin is flat and intact. There is no granulation within the wound bed. There is a large (67-100%) amount of necrotic tissue within the wound bed including Eschar and Adherent Slough. The periwound skin appearance exhibited: Dry/Scaly, Moist. The periwound skin appearance did not exhibit: Callus, Crepitus, Excoriation, Fluctuance, Friable, Induration, Localized Edema, Rash, Scarring, Maceration, Atrophie Blanche, Cyanosis, Ecchymosis, Hemosiderin Staining, Mottled, Pallor, Rubor, Erythema. Periwound temperature was noted as No Abnormality. The periwound has tenderness on palpation. Wound #4 status is Open. Original cause of wound was Pressure Injury. The wound is located on the Right Calcaneus. The wound measures 0.6cm length x 2.4cm width x 0.1cm depth; 1.131cm^2 area and 0.113cm^3 volume. There is fat exposed. There is no  tunneling or undermining noted. There is a medium amount of serosanguineous  drainage noted. The wound margin is flat and intact. There is no granulation within the wound bed. There is a large (67-100%) amount of necrotic tissue within the wound bed including Eschar. The periwound skin appearance exhibited: Dry/Scaly, Moist. The periwound skin appearance did not exhibit: Callus, Crepitus, Excoriation, Fluctuance, Friable, Induration, Localized Edema, Rash, Scarring, Maceration, Atrophie Blanche, Cyanosis, Ecchymosis, Hemosiderin Staining, Mottled, Pallor, Rubor, Erythema. Periwound temperature was noted as No Abnormality. The periwound has tenderness on palpation. Wound #5 status is Open. Original cause of wound was Shear/Friction. The wound is located on the Right,Dorsal Foot. The wound measures 0.1cm length x 0.1cm width x 0.1cm depth; 0.008cm^2 area and 0.001cm^3 volume. The wound is limited to skin breakdown. There is no tunneling or undermining noted. There is a large amount of purulent drainage noted. The wound margin is distinct with the outline attached to the wound base. There is large (67-100%) pink granulation within the wound bed. There is a small (1-33%) amount of necrotic tissue within the wound bed including Adherent Slough. The periwound skin Hyde, Khali ([id number]) appearance exhibited: Maceration, Moist. Periwound temperature was noted as No Abnormality. The periwound has tenderness on palpation. Wound #6 status is Open. Original cause of wound was Shear/Friction. The wound is located on the Left,Dorsal Foot. The wound measures 0.1cm length x 0.1cm width x 0.1cm depth; 0.008cm^2 area and 0.001cm^3 volume. The wound is limited to skin breakdown. There is no tunneling or undermining noted. There is a medium amount of serosanguineous drainage noted. The wound margin is distinct with the outline attached to the wound base. There is large (67-100%) pink granulation within the wound bed. There is a small (1-33%) amount of necrotic tissue within the wound  bed including Adherent Slough. The periwound skin appearance exhibited: Moist. Periwound temperature was noted as No Abnormality. The periwound has tenderness on palpation. 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 There was a Non-Viable Tissue Open Wound/Selective [phone]) debridement with total area of 1.44 sq cm performed by [name], MD. with the following instrument(s): Curette to remove Non-Viable tissue/material including Exudate after achieving pain control using Lidocaine 4% Topical Solution. A time out was conducted at 15:56, prior to the start of the procedure. There was no bleeding. The procedure was tolerated well with a pain level of 0 throughout and a pain level of 0 following the procedure. Post Debridement Measurements: 0.6cm length x 2.4cm width x 0.1cm depth; 0.113cm^3 volume. Character of Wound/Ulcer Post Debridement is improved. Severity of Tissue Post Debridement is: Limited to breakdown of skin. Post procedure Diagnosis Wound #4: Same as Pre-Procedure Bielicki, Basil ([id number]) Plan Wound Cleansing: Wound #3 Left Calcaneus: Clean wound with Normal Saline. Cleanse wound with mild soap and water - HHRN please was feet and legs with mild soap and water and dry. Wound #4 Right Calcaneus: Clean wound with Normal Saline. Cleanse wound with mild soap and water - HHRN please was feet and legs with mild soap and water and dry. Wound #5 Right,Dorsal Foot: Clean wound with Normal Saline. Cleanse wound with mild soap and water -  HHRN please was feet and legs with mild soap and water  and dry. Wound #6 Left,Dorsal Foot: Clean wound with Normal Saline. Cleanse wound with mild soap and water - HHRN please was feet and legs with mild soap and water and dry. Anesthetic: Wound #3 Left Calcaneus: Topical Lidocaine 4% cream applied to wound bed prior to debridement - office use only Wound #4 Right Calcaneus: Topical Lidocaine 4% cream applied to wound bed prior to debridement - office use only Wound #5 Right,Dorsal Foot: Topical Lidocaine 4% cream applied to wound bed prior to debridement - office use only Wound #6 Left,Dorsal Foot: Topical Lidocaine 4% cream applied to wound bed prior to debridement - office use only Skin Barriers/Peri-Wound Care: Wound #3 Left Calcaneus: Moisturizing lotion - Please put lotion on pts legs and feet***do not put on wound or between toes*** Wound #4 Right Calcaneus: Moisturizing lotion - Please put lotion on pts legs and feet***do not put on wound or between toes*** Wound #5 Right,Dorsal Foot: Moisturizing lotion - Please put lotion on pts legs and feet***do not put on wound or between toes*** Wound #6 Left,Dorsal Foot: Moisturizing lotion - Please put lotion on pts legs and feet***do not put on wound or between toes*** Primary Wound Dressing: Wound #3 Left Calcaneus: Other: - foam Wound #5 Right,Dorsal Foot: Other: - foam Wound #6 Left,Dorsal Foot: Other: - foam Wound #4 Right Calcaneus: Aquacel Ag - rope--pack lightly into wound and into undermining Secondary Dressing: Wound #3 Left Calcaneus: Dry Gauze Under[place], Pape ([id number]) Conform/Kerlix - tape, netting Foam - heel cups to heels Wound #4 Right Calcaneus: Dry Gauze Conform/Kerlix - tape, netting Foam - heel cups to heels Wound #5 Right,Dorsal Foot: Dry Gauze Conform/Kerlix - tape, netting Foam - heel cups to heels Wound #6 Left,Dorsal Foot: Dry Gauze Conform/Kerlix - tape, netting Foam - heel cups to heels Dressing Change Frequency: Wound #3 Left  Calcaneus: Change dressing every other day. Wound #4 Right Calcaneus: Change dressing every other day. Wound #5 Right,Dorsal Foot: Change dressing every other day. Wound #6 Left,Dorsal Foot: Change dressing every other day. Follow-up Appointments: Wound #3 Left Calcaneus: Return Appointment in 1 week. Wound #4 Right Calcaneus: Return Appointment in 1 week. Wound #5 Right,Dorsal Foot: Return Appointment in 1 week. Wound #6 Left,Dorsal Foot: Return Appointment in 1 week. Off-Loading: Wound #3 Left Calcaneus: Heel suspension boot to: - pt to wear sage boots, please float heels Turn and reposition every 2 hours Other: - Hoyer Lift Wound #4 Right Calcaneus: Heel suspension boot to: - pt to wear sage boots, please float heels Turn and reposition every 2 hours Other: - Hoyer Lift Wound #5 Right,Dorsal Foot: Heel suspension boot to: - pt to wear sage boots, please float heels Turn and reposition every 2 hours Other: - Hoyer Lift Wound #6 Left,Dorsal Foot: Heel suspension boot to: - pt to wear sage boots, please float heels Turn and reposition every 2 hours Other: - Hoyer Lift Additional Orders / Instructions: Wound #3 Left Calcaneus: Schippers, Bert ([id number]) Increase protein intake. Wound #4 Right Calcaneus: Increase protein intake. Wound #5 Right,Dorsal Foot: Increase protein intake. Wound #6 Left,Dorsal Foot: Increase protein intake. Home Health: Wound #3 Left Calcaneus: Continue Home Health Visits - [place] Nurse may visit PRN to address patient s wound care needs. FACE TO FACE ENCOUNTER: MEDICARE and MEDICAID PATIENTS: I certify that this patient is under my care and that I had a face-to-face encounter that meets the physician face-to-face encounter requirements with  this patient on this date. The encounter with the patient was in whole or in part for the following MEDICAL CONDITION: (primary reason for [place]) MEDICAL NECESSITY: I  certify, that based on my findings, NURSING services are a medically necessary home health service. HOME BOUND STATUS: I certify that my clinical findings support that this patient is homebound (i.e., Due to illness or injury, pt requires aid of supportive devices such as crutches, cane, wheelchairs, walkers, the use of special transportation or the assistance of another person to leave their place of residence. There is a normal inability to leave the home and doing so requires considerable and taxing effort. Other absences are for medical reasons / religious services and are infrequent or of short duration when for other reasons). If current dressing causes regression in wound condition, may D/C ordered dressing product/s and apply Normal Saline Moist Dressing daily until next [place] / Other MD appointment. [place] of regression in wound condition at [phone]. Please direct any NON-WOUND related issues/requests for orders to patient's Primary Care Physician Wound #4 Right Calcaneus: [place] Nurse may visit PRN to address patient s wound care needs. FACE TO FACE ENCOUNTER: MEDICARE and MEDICAID PATIENTS: I certify that this patient is under my care and that I had a face-to-face encounter that meets the physician face-to-face encounter requirements with this patient on this date. The encounter with the patient was in whole or in part for the following MEDICAL CONDITION: (primary reason for [place]) MEDICAL NECESSITY: I certify, that based on my findings, NURSING services are a medically necessary home health service. HOME BOUND STATUS: I certify that my clinical findings support that this patient is homebound (i.e., Due to illness or injury, pt requires aid of supportive devices such as crutches, cane, wheelchairs, walkers, the use of special transportation or the assistance of another person to  leave their place of residence. There is a normal inability to leave the home and doing so requires considerable and taxing effort. Other absences are for medical reasons / religious services and are infrequent or of short duration when for other reasons). If current dressing causes regression in wound condition, may D/C ordered dressing product/s and apply Normal Saline Moist Dressing daily until next [place] / Other MD appointment. [place] of regression in wound condition at [phone]. Please direct any NON-WOUND related issues/requests for orders to patient's Primary Care Physician Wound #5 Right,Dorsal Foot: [place] Visits - [place] Nurse may visit PRN to address patient s wound care needs. FACE TO FACE ENCOUNTER: MEDICARE and MEDICAID PATIENTS: I certify that this patient is under my care and that I had a face-to-face encounter that meets the physician face-to-face encounter requirements with this patient on this date. The encounter with the patient was in whole or in part for the following MEDICAL CONDITION: (primary reason for [place]) MEDICAL NECESSITY: I certify, that based on my findings, NURSING services are a medically necessary home health service. HOME BOUND STATUS: I certify that my clinical findings support that this patient is homebound (i.e., Due to [place], KOZUB ([id number]) illness or injury, pt requires aid of supportive devices such as crutches, cane, wheelchairs, walkers, the use of special transportation or the assistance of another person to leave their place of residence. There is a normal inability to leave the home and doing so requires considerable and taxing effort. Other absences  are for medical reasons / religious services and are infrequent or of short duration when for other reasons). If current dressing causes regression in wound condition, may D/C ordered dressing product/s and  apply Normal Saline Moist Dressing daily until next [place] / Other MD appointment. [place] of regression in wound condition at [phone]. Please direct any NON-WOUND related issues/requests for orders to patient's Primary Care Physician Wound #6 Left,Dorsal Foot: [place] Visits - [place] Nurse may visit PRN to address patient s wound care needs. FACE TO FACE ENCOUNTER: MEDICARE and MEDICAID PATIENTS: I certify that this patient is under my care and that I had a face-to-face encounter that meets the physician face-to-face encounter requirements with this patient on this date. The encounter with the patient was in whole or in part for the following MEDICAL CONDITION: (primary reason for [place]) MEDICAL NECESSITY: I certify, that based on my findings, NURSING services are a medically necessary home health service. HOME BOUND STATUS: I certify that my clinical findings support that this patient is homebound (i.e., Due to illness or injury, pt requires aid of supportive devices such as crutches, cane, wheelchairs, walkers, the use of special transportation or the assistance of another person to leave their place of residence. There is a normal inability to leave the home and doing so requires considerable and taxing effort. Other absences are for medical reasons / religious services and are infrequent or of short duration when for other reasons). If current dressing causes regression in wound condition, may D/C ordered dressing product/s and apply Normal Saline Moist Dressing daily until next [place] / Other MD appointment. [place] of regression in wound condition at [phone]. Please direct any NON-WOUND related issues/requests for orders to patient's Primary Care Physician Medications-please add to medication list.: Wound #3 Left Calcaneus: P.O. Antibiotics - Cefinir Other: -  Vitamin C, Zinc, Multivitamin Wound #4 Right Calcaneus: Other: - Vitamin C, Zinc, Multivitamin Wound #5 Right,Dorsal Foot: Other: - Vitamin C, Zinc, Multivitamin Wound #6 Left,Dorsal Foot: Other: - Vitamin C, Zinc, Multivitamin silver alginate on the right heel with foam heel cup foam heal cup to the left Electronic Signature(s) Signed: 10/22/2016 4:39:00 PM By: Linton Ham MD Entered By: Linton Ham on 10/22/2016 16:33:46 Quinn, Fergus ([id number]) [name] ([id number]) -------------------------------------------------------------------------------- SuperBill Details Patient Name: [name] Date of Service: 10/22/2016 Medical Record Patient Account Number: [id number] Number: Treating RN: [name] [birth year] (60 y.o. 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

## 2016-10-28 ENCOUNTER — Encounter: Payer: Medicare Other | Admitting: Internal Medicine

## 2016-10-28 DIAGNOSIS — I70234 Atherosclerosis of native arteries of right leg with ulceration of heel and midfoot: Secondary | ICD-10-CM | POA: Diagnosis not present

## 2016-10-29 ENCOUNTER — Ambulatory Visit: Payer: Medicare Other | Admitting: Internal Medicine

## 2016-10-29 NOTE — Progress Notes (Signed)
[name], [name] ([id number]) Visit Report for 10/28/2016 Chief Complaint Document Details Patient Name: [name], [name] Date of Service: 10/28/2016 1:15 PM Medical Record Patient Account Number: [id number] Number: Treating RN: [name] 06-01-1956 (60 y.o. Other Clinician: Date of Birth/Sex: Male) Treating [name] Primary Care Physician: [name] Physician/Extender: [name] Referring Physician: Claudie Hughes in Treatment: 29 Information Obtained from: Patient Chief Complaint here for follow up on bilateral heel pressure ulcers Electronic Signature(s) Signed: 10/28/2016 5:07:10 PM By: Larry Ham MD Entered By: [name] on 10/28/2016 15:58:38 [name], [name] ([id number]) -------------------------------------------------------------------------------- Debridement Details Patient Name: [name] Date of Service: 10/28/2016 1:15 PM Medical Record Patient Account Number: [id number] Number: Treating RN: [name] [birth year] (60 y.o. Other Clinician: Date of Birth/Sex: Male) Treating [name], [name] Primary Care Physician: [name] Physician/Extender: [name] Referring Physician: Claudie Hughes in Treatment: 29 Debridement Performed for Wound #3 Left Calcaneus Assessment: Performed By: Physician Larry Dillon, MD Debridement: Debridement Pre-procedure Yes - 13:58 Verification/Time Out Taken: Start Time: 13:58 Pain Control: Lidocaine 4% Topical Solution Level: Skin/Subcutaneous Tissue Total Area Debrided (L x 0.2 (cm) x 0.4 (cm) = 0.08 (cm) W): Tissue and other Viable, Non-Viable, Eschar, Fibrin/Slough, Subcutaneous material debrided: Instrument: Curette Bleeding: Minimum Hemostasis Achieved: Pressure End Time: 14:02 Procedural Pain: 0 Post Procedural Pain: 0 Response to Treatment: Procedure was tolerated well Post Debridement Measurements of Total Wound Length: (cm) 0.2 Width: (cm) 0.4 Depth: (cm)  0.4 Volume: (cm) 0.025 Character of Wound/Ulcer Post Improved Debridement: Severity of Tissue Post Debridement: Fat layer exposed Post Procedure Diagnosis Same as Pre-procedure Electronic Signature(s) Signed: 10/28/2016 4:40:41 PM By: [name] Signed: 10/28/2016 5:07:10 PM By: Larry Ham MD [name], [name] ([id number]) Entered By: [name] on 10/28/2016 15:57:42 [name], [name] ([id number]) -------------------------------------------------------------------------------- HPI Details Patient Name: [name] Date of Service: 10/28/2016 1:15 PM Medical Record Patient Account Number: [id number] Number: Treating RN: [name] 1956-09-02 (60 y.o. Other Clinician: Date of Birth/Sex: Male) Treating [name] Primary Care Physician: [name] Physician/Extender: [name] Referring Physician: Claudie Hughes in Treatment: 29 History of Present Illness HPI Description: 04/08/16; this is a patient we really don't know too much about. He was hospitalized from 3/24 through 4/6 residing with a sodium of 167 creatinine of 2.49 right lower lobe pneumonia sepsis/septic shock syndrome. He apparently came back to [facility] He apparently had been there only 2 weeks before he went out. I know very little about him premorbidly. The hospital he had a CT scan of the head that showed atrophy and chronic small vessel white matter ischemic changes and remote infarcts also noted within the bilateral basal ganglia. His echocardiogram and presentation showed a EF of 20-25%, left ventricle was moderately dilated with diffuse hypokinesis. Apparently he return to his assisted living with wounds on his bilateral feet. According to the attendant came with him he could walk before he went out he could no longer walk now. There are wounds on both heels covered with a black eschar and also wounds on his bilateral  dorsal feet. ABIs calculated in our clinic for 0.49 bilaterally. He was a smoker up to 2 months ago he is not smoking now. 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 Monophasic waves distally bilaterally. He is going back at the end of the month  I believe June 29 probably for an arteriogram as arranged by Dr. Delana Hughes in the meantime he has had no real changes 04/30/16; we are waiting vascular review on June 29. The black eschar over his bilateral heels is beginning to separate using Santyl. The areas on the dorsal aspect of his feet looked considerably better/resolved. He apparently has no complaints. He also has a severe presumably ischemic cardiomyopathy as noted above 05/21/16:pt returns today for ongoing evaluation of wounds. I reviewed recent venous and arterial diagnostic findings. He is scheduled for a LLE angio on 05/26/16 and RLE angio on 06/02/16 for advanced evaluation of PAD as noted per lower extremity arterial duplex. BLE venous duplex was negative for venous reflux, DVT or insufficiency. 06/10/16; this is a patient I have not seen in over a month although he was seen here on 7/12. When I admitted him to our clinic I felt he had pressure ulcerations but also potential for limb threatening ischemia. I referred him urgently to vascular surgery. Since we have last seen him in this clinic he has undergone 2 procedures. On 7/17 he will underwent percutaneous transluminal angioplasty of the left above-knee popliteal artery as well as the mid to distal SFA. On 7/27 he underwent for cutaneous transluminal angioplasty of the right popliteal artery and SFA an angioplasty of the proximal SFA. He had a stent placement in the right popliteal artery. The patient is difficult to gauge symptomatology although he does not currently  complain of pain. Our intake nurse reports purulent drainage out of the left heel and an odor. 06/17/2016 -- right heel x-ray and left heel x-ray -- IMPRESSION:No radiographic evidence of osteomyelitis. Soft tissue changes are [place], [place] ([id number]) consistent with cellulitis. He had a wound culture which is positive for abundant Citrobacter fundi and pseudomonas aeruginosa. Both are sensitive to ciprofloxacin and at the present time the patient is on doxycycline 100 mg, prescribed by Dr. Dellia Nims last week. I will prescribe ciprofloxacin for him today. 07/16/16 it is been quite a while since I've seen these wounds however both of them are quite a bit better. Using [place], being followed by [facility] home [facility]. 07/30/16; I see this man every 2 weeks. He had ischemic wounds on his bilateral heels. He has been using Santyl with [facility] home [facility] coming out to his facility 08/12/16 Patient comes in today for a follow-up visit we see him every 2 weeks at this point in time. He has bilateral heel wounds the we have been applying Santyl to and he has home health coming out to this facility for these dressing changes. These wounds seem to be improving dramatically since he had the procedures with vascular to stent and improve his blood flow in the bilateral lower extremities. Currently he has been responding well to debridement with Dr. Dellia Nims as well. He tells me at this point he really has no sniffing discomfort or pain at this time. He is tolerating the dressing changes well at this point as well. 08/26/16; patient comes in for his two-week follow-up. He has bilateral heel pressure ulcers that were complicated by severe ischemia now corrected. Has been using Santyl to these areas. 09/17/2016 - Mr. Boling arrives today with an assistance from the facility that he resides at. Mr. Boyne offers no complaints, answering most questions "no" or "okay"; most information obtained from the assistant.  Heel protectors are now only being worn at night, not throughout the day and the patient has a history of resting heels directly on the  ground throughout the day versus on the foot rests of the wheelchair (R>L). Home health continues to provide dressing changes. The caregiver/attendant was encouraged to bring in heel protectors to next weeks visit for evaluation in addition, the patient and the attendant were encouraged to wear to heel protector at all times. Mr Armor remains non-ambulatory 10/08/16; patient arrives today in 3 week follow-up. When he was last seen here our staff and providers felt that he was well on his way to healing. He has home health changing the dressings. He arrives today with an odor coming out of the left heel that smelt like "rotten potatoes" according to our intake nurse. He is not been systemically unwell. The individual who accompanies him from the facility states that he uses his body boots at night but sits in the wheelchair rubbing his heal up and down on the dorsal aspect of the other foot. 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 Now status post revascularization. He had been doing quite nicely however last week presented with a very significant deterioration in the bilateral ulcers. Especially the area on the left had an older. CandS showed abundant Pseudomonas and Proteus. I empirically put him on ciprofloxacin although the Proteus was only intermediate in terms of its sensitivity. 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 Now status post revascularization he had been doing quite well and most of [place] felt these were going to heal however he developed recurrent trauma caused by rubbing his heels against the dorsal aspect of the other foot he subsequently  developed an infection with Proteus and Pseudomonas. I gave him Cipro however the protein was was only intermediately sensitive. I therefore have him on a 10 day course of Cefdinir 10/29/16; follow-up of bilateral heel ulcers which were initially a combination of pressure and severe PAD. He is now status post revascularization and has been doing quite well. In fact the right heel is healed. He has a continued problem with the left heel. He should be just about completing antibiotics. Electronic Signature(s) Signed: 10/28/2016 5:07:10 PM By: Larry Ham MD Entered By: [name] on 10/28/2016 16:03:07 [name], [name] ([id number]) PHU, [place] ([id number]) -------------------------------------------------------------------------------- Physical Exam Details Patient Name: [name] Date of Service: 10/28/2016 1:15 PM Medical Record Patient Account Number: [id number] Number: Treating RN: [name] [birth year] (60 y.o. Other Clinician: Date of Birth/Sex: Male) Treating Kavir Savoca Primary Care Physician: [name] Physician/Extender: [name] Referring Physician: Claudie Hughes in Treatment: 29 Constitutional Sitting or standing Blood Pressure is within target range for patient.. Pulse regular and within target range for patient.. Patient's appearance is neat and clean. Appears in no acute distress. Well nourished and well developed.. Notes Wound exam; the patient has 2 linear areas on the left heel. The area on the rleft had a surface eschar which I removed also nonviable subcutaneous tissue. He has a small open area remaining but with some depth. It does not probe there is no undermining. oThe area on the right heel still is epithelialized and there is no open area here. Electronic Signature(s) Signed: 10/28/2016 5:07:10 PM By: Larry Ham MD Entered By: [name] on 10/28/2016 16:03:39 [name]  ([id number]) -------------------------------------------------------------------------------- Physician Orders Details Patient Name: [name] Date of Service: 10/28/2016 1:15 PM Medical Record Patient Account Number: [id number] Number: Treating  RN: [name] August 04, 1956 (60 y.o. Other Clinician: Date of Birth/Sex: Male) Treating Delphia Kaylor Primary Care Physician: [name] Physician/Extender: [name] Referring Physician: Claudie Hughes in Treatment: 33 Verbal / Phone Orders: Yes Clinician: Montey Hughes Read Back and Verified: Yes Diagnosis Coding Wound Cleansing Wound #3 Left Calcaneus o Clean wound with Normal Saline. o Cleanse wound with mild soap and water - HHRN please was feet and legs with mild soap and water and dry. Anesthetic Wound #3 Left Calcaneus o Topical Lidocaine 4% cream applied to wound bed prior to debridement - office use only Skin Barriers/Peri-Wound Care Wound #3 Left Calcaneus o Moisturizing lotion - Please put lotion on pts legs and feet***do not put on wound or between toes*** Primary Wound Dressing Wound #3 Left Calcaneus o Prisma Ag - or collagen with silver equivalent o Other: - foam Secondary Dressing Wound #3 Left Calcaneus o Dry Gauze o Conform/Kerlix - tape, netting o Foam - heel cups to heels Dressing Change Frequency Wound #3 Left Calcaneus o Change dressing every other day. Follow-up Appointments Wound #3 Left Calcaneus o Return Appointment in 2 weeks. [place], Crescencio ([id number]) Off-Loading Wound #3 Left Calcaneus o Heel suspension boot to: - pt to wear sage boots, please float heels o Turn and reposition every 2 hours o Other: - Please use foam to protect the top of both feet Additional Orders / Instructions Wound #3 Left Calcaneus o Increase protein intake. Home Health Wound #3 Left Calcaneus o Continue Home Health Visits - [place] Nurse  may visit PRN to address patientos wound care needs. o FACE TO FACE ENCOUNTER: MEDICARE and MEDICAID PATIENTS: I certify that this patient is under my care and that I had a face-to-face encounter that meets the physician face-to-face encounter requirements with this patient on this date. The encounter with the patient was in whole or in part for the following MEDICAL CONDITION: (primary reason for [place]) MEDICAL NECESSITY: I certify, that based on my findings, NURSING services are a medically necessary home health service. HOME BOUND STATUS: I certify that my clinical findings support that this patient is homebound (i.e., Due to illness or injury, pt requires aid of supportive devices such as crutches, cane, wheelchairs, walkers, the use of special transportation or the assistance of another person to leave their place of residence. There is a normal inability to leave the home and doing so requires considerable and taxing effort. Other absences are for medical reasons / religious services and are infrequent or of short duration when for other reasons). o If current dressing causes regression in wound condition, may D/C ordered dressing product/s and apply Normal Saline Moist Dressing daily until next [place] / Other MD appointment. [place] of regression in wound condition at [phone]. o Please direct any NON-WOUND related issues/requests for orders to patient's Primary Care Physician Medications-please add to medication list. Wound #3 Left Calcaneus o P.O. Antibiotics - Cefinir o Other: - Vitamin C, Zinc, Multivitamin Electronic Signature(s) Signed: 10/28/2016 4:40:41 PM By: [name] Signed: 10/28/2016 5:07:10 PM By: Larry Ham MD Entered By: [name] on 10/28/2016 14:06:02 [name], [name] ([id number]) -------------------------------------------------------------------------------- Prescription 10/28/2016 Patient  Name: [name] Physician: Larry Dillon MD Date of Birth: 1956-04-13 NPI#: [id number] Sex: M DEA#: [id number] Phone #: [phone] License #: [id number] Patient Address: [place], [place] [address] [facility] [address], [place], [place] [address] [phone] Allergies  NKDA 38 Orders P.O. Antibiotics - Cefinir Signature(s): Date(s): Electronic Signature(s) Signed: 10/28/2016 4:40:41 PM By: [name] Signed: 10/28/2016 5:07:10 PM By: Larry Ham MD Entered By: [name] on 10/28/2016 14:06:03 [name] ([id number]) --------------------------------------------------------------------------------  Problem List Details Patient Name: [name] Date of Service: 10/28/2016 1:15 PM Medical Record Patient Account Number: [id number] Number: Treating RN: [name] September 30, 1956 (60 y.o. 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 04/08/2016 Yes of other part of foot Inactive Problems Resolved Problems ICD-10 Code Description Active Date Resolved Date L89.610 Pressure ulcer of right heel, unstageable 04/08/2016 04/08/2016 L89.620 Pressure ulcer of left heel, unstageable 04/08/2016 04/08/2016 Electronic Signature(s) Signed: 10/28/2016 5:07:10 PM By: Larry Ham MD Entered By: [name] on 10/28/2016 15:55:45 Gut, Lonney  ([id number]) [name] ([id number]) -------------------------------------------------------------------------------- Progress Note Details Patient Name: [name] Date of Service: 10/28/2016 1:15 PM Medical Record Patient Account Number: [id number] Number: Treating RN: [name] Jun 18, 1956 (60 y.o. Other Clinician: Date of Birth/Sex: Male) Treating Christopher Glasscock Primary Care Physician: [name] Physician/Extender: [name] Referring Physician: Claudie Hughes in Treatment: 29 Subjective Chief Complaint Information obtained from Patient here for follow up on bilateral heel pressure ulcers History of Present Illness (HPI) 04/08/16; this is a patient we really don't know too much about. He was hospitalized from 3/24 through 4/6 residing with a sodium of 167 creatinine of 2.49 right lower lobe pneumonia sepsis/septic shock syndrome. He apparently came back to [facility] He apparently had been there only 2 weeks before he went out. I know very little about him premorbidly. The hospital he had a CT scan of the head that showed atrophy and chronic small vessel white matter ischemic changes and remote infarcts also noted within the bilateral basal ganglia. His echocardiogram and presentation showed a EF of 20-25%, left ventricle was moderately dilated with diffuse hypokinesis. Apparently he return to his assisted living with wounds on his bilateral feet. According to the attendant came with him he could walk before he went out he could no longer walk now. There are wounds on both heels covered with a black eschar and also wounds on his bilateral dorsal feet. ABIs calculated in our clinic for 0.49 bilaterally. He was a smoker up to 2 months ago he is not smoking now. 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 He has occlusions in  the superficial femoral artery in the proximal mid and distal areas bilaterally. Monophasic waves distally bilaterally. He is going back at the end of the month I believe June 29 probably for an arteriogram as arranged by Dr. Delana Hughes in the meantime he has had no real changes 04/30/16; we are waiting vascular review on June 29. The black eschar over his bilateral heels is beginning to separate using Santyl. The areas on the dorsal aspect of his feet looked considerably better/resolved. He apparently has no complaints. He also has a severe presumably ischemic cardiomyopathy as noted above 05/21/16:pt returns today for ongoing evaluation of  wounds. I reviewed recent venous and arterial diagnostic findings. He is scheduled for a LLE angio on 05/26/16 and RLE angio on 06/02/16 for advanced evaluation of PAD as noted per lower extremity arterial duplex. BLE venous duplex was negative for venous reflux, DVT or insufficiency. 06/10/16; this is a patient I have not seen in over a month although he was seen here on 7/12. When I admitted him to our clinic I felt he had pressure ulcerations but also potential for limb threatening ischemia. I referred him urgently to vascular surgery. Since we have last seen him in this clinic he has undergone 2 procedures. On 7/17 he will underwent percutaneous transluminal angioplasty of the left above-knee popliteal artery as well as the mid to distal SFA. On 7/27 he underwent for cutaneous transluminal angioplasty of the right popliteal artery and SFA an angioplasty of the proximal SFA. He had a stent Pedley, Carter ([id number]) placement in the right popliteal artery. The patient is difficult to gauge symptomatology although he does not currently complain of pain. Our intake nurse reports purulent drainage out of the left heel and an odor. 06/17/2016 -- right heel x-ray and left heel x-ray -- IMPRESSION:No radiographic evidence of osteomyelitis. Soft tissue changes  are consistent with cellulitis. He had a wound culture which is positive for abundant Citrobacter fundi and pseudomonas aeruginosa. Both are sensitive to ciprofloxacin and at the present time the patient is on doxycycline 100 mg, prescribed by Dr. Dellia Nims last week. I will prescribe ciprofloxacin for him today. 07/16/16 it is been quite a while since I've seen these wounds however both of them are quite a bit better. Using [place], being followed by [facility]. 07/30/16; I see this man every 2 weeks. He had ischemic wounds on his bilateral heels. He has been using Santyl with advanced home health coming out to his facility 08/12/16 Patient comes in today for a follow-up visit we see him every 2 weeks at this point in time. He has bilateral heel wounds the we have been applying Santyl to and he has home health coming out to this facility for these dressing changes. These wounds seem to be improving dramatically since he had the procedures with vascular to stent and improve his blood flow in the bilateral lower extremities. Currently he has been responding well to debridement with Dr. Dellia Nims as well. He tells me at this point he really has no sniffing discomfort or pain at this time. He is tolerating the dressing changes well at this point as well. 08/26/16; patient comes in for his two-week follow-up. He has bilateral heel pressure ulcers that were complicated by severe ischemia now corrected. Has been using Santyl to these areas. 09/17/2016 - Mr. Baldivia arrives today with an assistance from the facility that he resides at. Mr. Dimler offers no complaints, answering most questions "no" or "okay"; most information obtained from the assistant. Heel protectors are now only being worn at night, not throughout the day and the patient has a history of resting heels directly on the ground throughout the day versus on the foot rests of the wheelchair (R>L). Home health continues to provide dressing  changes. The caregiver/attendant was encouraged to bring in heel protectors to next weeks visit for evaluation in addition, the patient and the attendant were encouraged to wear to heel protector at all times. Mr Ferson remains non-ambulatory 10/08/16; patient arrives today in 3 week follow-up. When he was last seen here our staff and providers felt that he was well  on his way to healing. He has home health changing the dressings. He arrives today with an odor coming out of the left heel that smelt like "rotten potatoes" according to our intake nurse. He is not been systemically unwell. The individual who accompanies him from the facility states that he uses his body boots at night but sits in the wheelchair rubbing his heal up and down on the dorsal aspect of the other foot. 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 Now status post revascularization. He had been doing quite nicely however last week presented with a very significant deterioration in the bilateral ulcers. Especially the area on the left had an older. CandS showed abundant Pseudomonas and Proteus. I empirically put him on ciprofloxacin although the Proteus was only intermediate in terms of its sensitivity. 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 Now status post revascularization he had been doing quite well and most of [place] felt these were going to heal however he developed recurrent trauma caused by rubbing his heels against the dorsal aspect of the other foot he subsequently developed an infection with Proteus and Pseudomonas. I gave him Cipro however the protein was was only intermediately sensitive. I therefore have him on a 10 day course of Cefdinir 10/29/16; follow-up of bilateral heel ulcers which were initially a  combination of pressure and severe PAD. He is now status post revascularization and has been doing quite well. In fact the right heel is healed. He has a continued problem with the left heel. He should be just about completing antibiotics. [name], [name] ([id number]) Objective Constitutional Sitting or standing Blood Pressure is within target range for patient.. Pulse regular and within target range for patient.. Patient's appearance is neat and clean. Appears in no acute distress. Well nourished and well developed.. Vitals Time Taken: 1:36 PM, Height: 70 in, Pulse: 56 bpm, Respiratory Rate: 16 breaths/min, Blood Pressure: 109/69 mmHg. General Notes: Wound exam; the patient has 2 linear areas on the left heel. The area on the rleft had a surface eschar which I removed also nonviable subcutaneous tissue. He has a small open area remaining but with some depth. It does not probe there is no undermining. The area on the right heel still is epithelialized and there is no open area here. Integumentary (Hair, Skin) Wound #3 status is Open. Original cause of wound was Pressure Injury. The wound is located on the Left Calcaneus. The wound measures 0.2cm length x 0.4cm width x 0.4cm depth; 0.063cm^2 area and 0.025cm^3 volume. There is fat exposed. There is a large amount of purulent drainage noted. The wound margin is flat and intact. There is no granulation within the wound bed. There is a large (67-100%) amount of necrotic tissue within the wound bed including Eschar and Adherent Slough. The periwound skin appearance exhibited: Dry/Scaly, Moist. The periwound skin appearance did not exhibit: Callus, Crepitus, Excoriation, Fluctuance, Friable, Induration, Localized Edema, Rash, Scarring, Maceration, Atrophie Blanche, Cyanosis, Ecchymosis, Hemosiderin Staining, Mottled, Pallor, Rubor, Erythema. Periwound temperature was noted as No Abnormality. The periwound has tenderness on palpation. Wound #4 status  is Healed - Epithelialized. Original cause of wound was Pressure Injury. The wound is located on the Right Calcaneus. The wound measures 0cm length x 0cm width x 0cm depth; 0cm^2 area and 0cm^3 volume. Wound #5 status  is Healed - Epithelialized. Original cause of wound was Shear/Friction. The wound is located on the Right,Dorsal Foot. The wound measures 0cm length x 0cm width x 0cm depth; 0cm^2 area and 0cm^3 volume. Wound #6 status is Healed - Epithelialized. Original cause of wound was Shear/Friction. The wound is located on the Left,Dorsal Foot. The wound measures 0cm length x 0cm width x 0cm depth; 0cm^2 area and 0cm^3 volume. 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 There was a Skin/Subcutaneous Tissue Debridement [id number]) debridement with total area of 0.08 sq cm performed by Larry Dillon, MD. with the following instrument(s): Curette to remove Viable and Non-Viable tissue/material including Fibrin/Slough, Eschar, and Subcutaneous after achieving pain control using Lidocaine 4% Topical Solution. A time out was conducted at 13:58, prior to the start of the procedure. A Minimum amount of bleeding was controlled with Pressure. The procedure was tolerated well with a pain level of 0 throughout and a pain level of 0 following the procedure. Post Debridement Measurements: 0.2cm length x 0.4cm width x 0.4cm depth; 0.025cm^3 volume. Character of Wound/Ulcer Post Debridement is improved. Severity of Tissue Post Debridement is: Fat layer exposed. Post procedure Diagnosis Wound #3: Same as  Pre-Procedure Plan Wound Cleansing: Wound #3 Left Calcaneus: Clean wound with Normal Saline. Cleanse wound with mild soap and water - HHRN please was feet and legs with mild soap and water and dry. Anesthetic: Wound #3 Left Calcaneus: Topical Lidocaine 4% cream applied to wound bed prior to debridement - office use only Skin Barriers/Peri-Wound Care: Wound #3 Left Calcaneus: Moisturizing lotion - Please put lotion on pts legs and feet***do not put on wound or between toes*** Primary Wound Dressing: Mayorquin, [name] ([id number]) Wound #3 Left Calcaneus: Prisma Ag - or collagen with silver equivalent Other: - foam Secondary Dressing: Wound #3 Left Calcaneus: Dry Gauze Conform/Kerlix - tape, netting Foam - heel cups to heels Dressing Change Frequency: Wound #3 Left Calcaneus: Change dressing every other day. Follow-up Appointments: Wound #3 Left Calcaneus: Return Appointment in 2 weeks. Off-Loading: Wound #3 Left Calcaneus: Heel suspension boot to: - pt to wear sage boots, please float heels Turn and reposition every 2 hours Other: - Please use foam to protect the top of both feet Additional Orders / Instructions: Wound #3 Left Calcaneus: Increase protein intake. Home Health: Wound #3 Left Calcaneus: Continue Home Health Visits - [place] Nurse may visit PRN to address patient s wound care needs. FACE TO FACE ENCOUNTER: MEDICARE and MEDICAID PATIENTS: I certify that this patient is under my care and that I had a face-to-face encounter that meets the physician face-to-face encounter requirements with this patient on this date. The encounter with the patient was in whole or in part for the following MEDICAL CONDITION: (primary reason for [place]) MEDICAL NECESSITY: I certify, that based on my findings, NURSING services are a medically necessary home health service. HOME BOUND STATUS: I certify that my clinical findings support that this patient is  homebound (i.e., Due to illness or injury, pt requires aid of supportive devices such as crutches, cane, wheelchairs, walkers, the use of special transportation  or the assistance of another person to leave their place of residence. There is a normal inability to leave the home and doing so requires considerable and taxing effort. Other absences are for medical reasons / religious services and are infrequent or of short duration when for other reasons). If current dressing causes regression in wound condition, may D/C ordered dressing product/s and apply Normal Saline Moist Dressing daily until next [place] / Other MD appointment. [place] of regression in wound condition at [phone]. Please direct any NON-WOUND related issues/requests for orders to patient's Primary Care Physician Medications-please add to medication list.: Wound #3 Left Calcaneus: P.O. Antibiotics - Cefinir Other: - Vitamin C, Zinc, Multivitamin Bleau, Tino ([id number]) To the left heel prisma and foam right heel only foam Electronic Signature(s) Signed: 10/28/2016 5:07:10 PM By: Larry Ham MD Entered By: [name] on 10/28/2016 16:05:03 [place], [name] ([id number]) -------------------------------------------------------------------------------- SuperBill Details Patient Name: [name] Date of Service: 10/28/2016 Medical Record Patient Account Number: [id number] Number: Treating RN: [name] 03/30/1956 (60 y.o. 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

## 2016-10-29 NOTE — Progress Notes (Addendum)
RAVEN, THEYS ([id number]) Visit Report for 10/28/2016 Arrival Information Details Patient Name: [name], [name] Date of Service: 10/28/2016 1:15 PM Medical Record Number: [id number] Patient Account Number: [id number] Date of Birth/Sex: 18-Mar-1956 (60 y.o. Male) Treating RN: [name] Primary Care Physician: [name] Other Clinician: Referring Physician: [name] Treating Physician/Extender: [name] in Treatment: 29 Visit Information History Since Last Visit Added or deleted any medications: No Patient Arrived: Wheel Chair Any new allergies or adverse reactions: No Arrival Time: 13:33 Had a fall or experienced change in No activities of daily living that may affect Accompanied By: staff risk of falls: Transfer Assistance: Manual Signs or symptoms of abuse/neglect since last No Patient Identification Verified: Yes visito Secondary Verification Process Yes Hospitalized since last visit: No Completed: Pain Present Now: No Patient Requires Transmission-Based No Precautions: Patient Has Alerts: No Electronic Signature(s) Signed: 10/28/2016 4:40:41 PM By: [name] Entered By: [name] on 10/28/2016 13:33:36 Eckstein, Randee ([id number]) -------------------------------------------------------------------------------- Complex / Palliative Patient Assessment Details Patient Name: [name] Date of Service: 10/28/2016 1:15 PM Medical Record Number: [id number] Patient Account Number: [id number] Date of Birth/Sex: 1956/06/19 (60 y.o. Male) Treating RN: Cornell Barman Primary Care Physician: [name] Other Clinician: Referring Physician: [name] Treating Physician/Extender: [name] in Treatment: 29 Palliative Management Criteria Complex Wound Management Criteria The patient has limited personal or cognitive resources, or has no access to appropriate ongoing care providers, such that it is unreasonable to expect a  level of compliance with prescribed advanced wound care treatments necessary to achieve desired healing outcomes. (Must be documented in physician progress notes.) Care Approach Wound Care Plan: Complex Wound Management Electronic Signature(s) Signed: 11/12/2016 12:43:52 PM By: Gretta Cool RN, BSN, Kim RN, BSN Signed: 11/25/2016 7:36:33 AM By: Linton Ham MD Entered By: Gretta Cool, RN, BSN, Kim on 11/12/2016 12:43:52 [name] ([id number]) -------------------------------------------------------------------------------- Encounter Discharge Information Details Patient Name: [name] Date of Service: 10/28/2016 1:15 PM Medical Record Number: [id number] Patient Account Number: [id number] Date of Birth/Sex: [birth year] (60 y.o. Male) Treating RN: [name] Primary Care Physician: [name] Other Clinician: Referring Physician: [name] Treating Physician/Extender: [name] in Treatment: 29 Encounter Discharge Information Items Discharge Pain Level: 0 Discharge Condition: Stable Ambulatory Status: Wheelchair Discharge Destination: Home Transportation: Private Auto Accompanied By: staff Schedule Follow-up Appointment: Yes Medication Reconciliation completed and provided to Patient/Care No Deklan Minar: Provided on Clinical Summary of Care: 10/28/2016 Form Type Recipient Paper Patient DG Electronic Signature(s) Signed: 10/28/2016 2:17:16 PM By: [name] Entered By: [name] on 10/28/2016 14:17:16 [place], [name] ([id number]) -------------------------------------------------------------------------------- Lower Extremity Assessment Details Patient Name: [name] Date of Service: 10/28/2016 1:15 PM Medical Record Number: [id number] Patient Account Number: [id number] Date of Birth/Sex: 1956-02-22 (60 y.o. Male) Treating RN: [name] Primary Care Physician: [name] Other Clinician: Referring Physician: [name] Treating  Physician/Extender: [name] in Treatment: 29 Edema Assessment Assessed: [Left: No] [Right: No] Edema: [Left: No] [Right: No] Vascular Assessment Pulses: Posterior Tibial Popliteal Palpable: [Left:Yes] [Right:Yes] Extremity colors, hair growth, and conditions: Extremity Color: [Left:Normal] [Right:Normal] Temperature of Extremity: [Left:Warm] [Right:Warm] Capillary Refill: [Left:< 3 seconds] [Right:< 3 seconds] Electronic Signature(s) Signed: 10/28/2016 4:40:41 PM By: [name] Entered By: [name] on 10/28/2016 14:00:10 [name] ([id number]) -------------------------------------------------------------------------------- Multi Wound Chart Details Patient Name: [name] Date of Service: 10/28/2016 1:15 PM Medical Record Number: [id number] Patient Account Number: [id number] Date of Birth/Sex: 1956/04/11 (60 y.o. 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, Other Secondary Etiology: Pressure Ulcer Pressure Ulcer N/A Comorbid History: Anemia N/A N/A Date Acquired: 02/07/2016 02/07/2016 10/08/2016 Weeks of Treatment: 29 29 2  Wound Status: Open Healed - Epithelialized Healed - Epithelialized Measurements L x W x D 0.2x0.4x0.4 0x0x0 0x0x0 (cm) Area (cm) : 0.063 0 0 Volume (cm) : 0.025 0 0 % Reduction in Area: 99.70% 100.00% 100.00% % Reduction in Volume: 98.80% 100.00% 100.00% Classification: Full Thickness  Without Full Thickness Without Partial Thickness Exposed Support Exposed Support Structures Structures Exudate Amount: Large N/A N/A Exudate Type: Purulent N/A N/A Exudate Color: yellow, brown, Ruzich N/A N/A Wound Margin: Flat and Intact N/A N/A Granulation Amount: None Present (0%) N/A N/A Necrotic Amount: Large (67-100%) N/A N/A Saggese, Omran ([id number]) Necrotic Tissue: Eschar, Adherent Slough N/A N/A Exposed Structures: Fat: Yes N/A N/A Fascia: No Tendon: No Muscle: No Joint: No Bone: No Epithelialization: Small (1-33%) N/A N/A Debridement: Debridement [id number] N/A N/A [id number]) Pre-procedure 13:58 N/A N/A Verification/Time Out Taken: Pain Control: Lidocaine 4% Topical N/A N/A Solution Tissue Debrided: Necrotic/Eschar, N/A N/A Fibrin/Slough, Subcutaneous Level: Skin/Subcutaneous N/A N/A Tissue Debridement Area (sq 0.08 N/A N/A cm): Instrument: Curette N/A N/A Bleeding: Minimum N/A N/A Hemostasis Achieved: Pressure N/A N/A Procedural Pain: 0 N/A N/A Post Procedural Pain: 0 N/A N/A Debridement Treatment Procedure was tolerated N/A N/A Response: well Post Debridement 0.2x0.4x0.4 N/A N/A Measurements L x W x D (cm) Post Debridement 0.025 N/A N/A Volume: (cm) Periwound Skin Texture: Edema: No No Abnormalities Noted No Abnormalities Noted Excoriation: No Induration: No Callus: No Crepitus: No Fluctuance: No Friable: No Rash: No Scarring: No Periwound Skin Moist: Yes No Abnormalities Noted No Abnormalities Noted Moisture: Dry/Scaly: Yes Maceration: No Periwound Skin Color: Atrophie Blanche: No No Abnormalities Noted No Abnormalities Noted Cyanosis: No Ecchymosis: No Erythema: No Hook, Herby ([id number]) Hemosiderin Staining: No Mottled: No Pallor: No Rubor: No Temperature: No Abnormality N/A N/A Tenderness on Yes No No Palpation: Wound Preparation: Ulcer Cleansing: N/A N/A Rinsed/Irrigated with Saline Topical Anesthetic Applied: Other:  lidocaine 4% Procedures Performed: Debridement N/A N/A Wound Number: 6 N/A N/A Photos: N/A N/A Wound Location: Left, Dorsal Foot N/A N/A Wounding Event: Shear/Friction N/A N/A Primary Etiology: Trauma, Other N/A N/A Secondary Etiology: N/A N/A N/A Comorbid History: N/A N/A N/A Date Acquired: 10/08/2016 N/A N/A Weeks of Treatment: 2 N/A N/A Wound Status: Healed - Epithelialized N/A N/A Measurements L x W x D 0x0x0 N/A N/A (cm) Area (cm) : 0 N/A N/A Volume (cm) : 0 N/A N/A % Reduction in Area: 100.00% N/A N/A % Reduction in Volume: 100.00% N/A N/A Classification: Partial Thickness N/A N/A Exudate Amount: N/A N/A N/A Exudate Type: N/A N/A N/A Exudate Color: N/A N/A N/A Wound Margin: N/A N/A N/A Granulation Amount: N/A N/A N/A Necrotic Amount: N/A N/A N/A Necrotic Tissue: N/A N/A N/A Exposed Structures: N/A N/A N/A Epithelialization: N/A N/A N/A Debridement: N/A N/A N/A Grabel, Ramere ([id number]) Pain Control: N/A N/A N/A Tissue Debrided: N/A N/A N/A Level: N/A N/A N/A Debridement Area (sq N/A N/A N/A cm): Instrument: N/A N/A N/A Bleeding: N/A N/A N/A Hemostasis Achieved: N/A N/A N/A Procedural Pain: N/A N/A N/A Post Procedural Pain: N/A N/A N/A Debridement Treatment N/A N/A N/A  Response: Post Debridement N/A N/A N/A Measurements L x W x D (cm) Post Debridement N/A N/A N/A Volume: (cm) Periwound Skin Texture: No Abnormalities Noted N/A N/A Periwound Skin No Abnormalities Noted N/A N/A Moisture: Periwound Skin Color: No Abnormalities Noted N/A N/A Temperature: N/A N/A N/A Tenderness on No N/A N/A Palpation: Wound Preparation: N/A N/A N/A Procedures Performed: N/A N/A N/A Treatment Notes Wound #3 (Left Calcaneus) 1. Cleansed with: Clean wound with Normal Saline 2. Anesthetic Topical Lidocaine 4% cream to wound bed prior to debridement 4. Dressing Applied: Prisma Ag 5. Secondary Dressing Applied Foam Kerlix/Conform 7. Secured  with Tape Notes netting Electronic Signature(s) Signed: 10/28/2016 5:07:10 PM By: Linton Ham MD Entered By: Linton Ham on 10/28/2016 15:55:57 Arenson, Zerick ([id number]) CLEE, COMAR ([id number]) -------------------------------------------------------------------------------- Multi-Disciplinary Care Plan Details Patient Name: [name] Date of Service: 10/28/2016 1:15 PM Medical Record Number: [id number] Patient Account Number: [id number] Date of Birth/Sex: Mar 13, 1956 (60 y.o. Male) Treating RN: [name] Primary Care Physician: [name] Other Clinician: Referring Physician: [name] Treating Physician/Extender: [name] in Treatment: 37 Active Inactive Abuse / Safety / Falls / Self Care Management Nursing Diagnoses: Potential for falls Goals: Patient will remain injury free Date Initiated: 04/08/2016 Goal Status: Active Interventions: Assess fall risk on admission and as needed Notes: Nutrition Nursing Diagnoses: Imbalanced nutrition Goals: Patient/caregiver agrees to and verbalizes understanding of need to use nutritional supplements and/or vitamins as prescribed Date Initiated: 04/08/2016 Goal Status: Active Interventions: Assess patient nutrition upon admission and as needed per policy Notes: Orientation to the Wound Care Program Nursing Diagnoses: Knowledge deficit related to the wound healing center program Goals: Patient/caregiver will verbalize understanding of the [facility] ([id number]) Date Initiated: 04/08/2016 Goal Status: Active Interventions: Provide education on orientation to the wound center Notes: Pain, Acute or Chronic Nursing Diagnoses: Pain, acute or chronic: actual or potential Potential alteration in comfort, pain Goals: Patient will verbalize adequate pain control and receive pain control interventions during procedures as needed Date Initiated: 04/08/2016 Goal  Status: Active Interventions: Assess comfort goal upon admission Complete pain assessment as per visit requirements Notes: Pressure Nursing Diagnoses: Knowledge deficit related to causes and risk factors for pressure ulcer development Knowledge deficit related to management of pressures ulcers Goals: Patient will remain free from development of additional pressure ulcers Date Initiated: 04/08/2016 Goal Status: Active Interventions: Assess offloading mechanisms upon admission and as needed Assess potential for pressure ulcer upon admission and as needed Notes: Wound/Skin Impairment Nursing Diagnoses: Impaired tissue integrity Saulters, Seydina ([id number]) Goals: Ulcer/skin breakdown will have a volume reduction of 30% by week 4 Date Initiated: 04/08/2016 Goal Status: Active Ulcer/skin breakdown will have a volume reduction of 50% by week 8 Date Initiated: 04/08/2016 Goal Status: Active Ulcer/skin breakdown will have a volume reduction of 80% by week 12 Date Initiated: 04/08/2016 Goal Status: Active Interventions: Assess ulceration(s) every visit Notes: Electronic Signature(s) Signed: 10/28/2016 4:40:41 PM By: [name] Entered By: [name] on 10/28/2016 14:01:37 Clemenson, [name] ([id number]) -------------------------------------------------------------------------------- Pain Assessment Details Patient Name: [name] Date of Service: 10/28/2016 1:15 PM Medical Record Number: [id number] Patient Account Number: [id number] Date of Birth/Sex: [birth year] (60 y.o. Male) Treating RN: [name] Primary Care Physician: [name] Other Clinician: Referring Physician: [name] Treating Physician/Extender: [name] in Treatment: 29 Active Problems Location of Pain Severity and Description of Pain Patient Has Paino No Site Locations Pain Management and Medication Current Pain Management: Notes Topical or injectable lidocaine is offered to  patient for acute pain when surgical debridement is performed. If needed, Patient is instructed to use over the counter pain medication for the following 24-48 hours after debridement. Wound care MDs do not prescribed pain medications. Patient has chronic pain or uncontrolled pain. Patient has been instructed to make an appointment with their Primary Care Physician for pain management. Electronic Signature(s) Signed: 10/28/2016 4:40:41 PM By: [name] Entered By: [name] on 10/28/2016 13:34:55 Doshier, [name] ([id number]) -------------------------------------------------------------------------------- Patient/Caregiver Education Details Patient Name: [name] Date of Service: 10/28/2016 1:15 PM Medical Record Number: [id number] Patient Account Number: [id number] Date of Birth/Gender: [birth year] (60 y.o. Male) Treating RN: [name] Primary Care Physician: [name] Other Clinician: Referring Physician: [name] Treating Physician/Extender: [name] in Treatment: 80 Education Assessment Education Provided To: Patient and Caregiver Education Topics Provided Offloading: Handouts: Other: continue wearing bunny boots Methods: Explain/Verbal Responses: State content correctly Electronic Signature(s) Signed: 10/28/2016 4:40:41 PM By: [name] Entered By: [name] on 10/28/2016 14:17:03 Jemison, Brigido ([id number]) -------------------------------------------------------------------------------- Wound Assessment Details Patient Name: [name] Date of Service: 10/28/2016 1:15 PM Medical Record Number: [id number] Patient Account Number: [id number] Date of Birth/Sex: 10/17/1956 (60 y.o. Male) Treating RN: [name] Primary Care Physician: [name] Other Clinician: Referring Physician: [name] Treating Physician/Extender: [name] in Treatment: 29 Wound Status Wound Number: 3 Primary  Etiology: Arterial Insufficiency Ulcer Wound Location: Left Calcaneus Secondary Etiology: Pressure Ulcer Wounding Event: Pressure Injury Wound Status: Open Date Acquired: 02/07/2016 Comorbid History: Anemia Weeks Of Treatment: 29 Clustered Wound: No Photos Wound Measurements Length: (cm) 0.2 Width: (cm) 0.4 Depth: (cm) 0.4 Area: (cm) 0.063 Volume: (cm) 0.025 % Reduction in Area: 99.7% % Reduction in Volume: 98.8% Epithelialization: Small (1-33%) Wound Description Full Thickness Without Exposed Classification: Support Structures Wound Margin: Flat and Intact Exudate Large Amount: Exudate Type: Purulent Exudate Color: yellow, brown, Rumsey Foul Odor After Cleansing: No Wound Bed Granulation Amount: None Present (0%) Exposed Structure Necrotic Amount: Large (67-100%) Fascia Exposed: No Necrotic Quality: Eschar, Adherent Slough Fat Layer Exposed: Yes Melnik, Daryon ([id number]) Tendon Exposed: No Muscle Exposed: No Joint Exposed: No Bone Exposed: No Periwound Skin Texture Texture Color No Abnormalities Noted: No No Abnormalities Noted: No Callus: No Atrophie Blanche: No Crepitus: No Cyanosis: No Excoriation: No Ecchymosis: No Fluctuance: No Erythema: No Friable: No Hemosiderin Staining: No Induration: No Mottled: No Localized Edema: No Pallor: No Rash: No Rubor: No Scarring: No Temperature / Pain Moisture Temperature: No Abnormality No Abnormalities Noted: No Tenderness on Palpation: Yes Dry / Scaly: Yes Maceration: No Moist: Yes Wound Preparation Ulcer Cleansing: Rinsed/Irrigated with Saline Topical Anesthetic Applied: Other: lidocaine 4%, Electronic Signature(s) Signed: 10/28/2016 4:40:41 PM By: [name] Entered By: [name] on 10/28/2016 14:50:14 Teicher, Darryn ([id number]) -------------------------------------------------------------------------------- Wound Assessment Details Patient Name: [name] Date of Service:  10/28/2016 1:15 PM Medical Record Number: [id number] Patient Account Number: [id number] Date of Birth/Sex: [birth year] (60 y.o. Male) Treating RN: [name] Primary Care Physician: [name] Other Clinician: Referring Physician: [name] Treating Physician/Extender: [name] in Treatment: 29 Wound Status Wound Number: 4 Primary Etiology: Arterial Insufficiency Ulcer Wound Location: Right Calcaneus Secondary Etiology: Pressure Ulcer Wounding Event: Pressure Injury Wound Status: Healed - Epithelialized Date Acquired: 02/07/2016 Weeks Of Treatment: 29 Clustered Wound: No Photos Photo Uploaded By: [name] on 10/28/2016 14:49:15 Wound Measurements Length: (cm) 0 % Reduct Width: (cm) 0 % Reduct Depth: (cm) 0 Area: (cm) 0 Volume: (cm) 0 ion in Area: 100% ion in Volume:  100% Wound Description Full Thickness Without Exposed Classification: Support Structures Periwound Skin Texture Texture Color No Abnormalities Noted: No No Abnormalities Noted: No Moisture No Abnormalities Noted: No Electronic Signature(s) Signed: 10/28/2016 4:40:41 PM By: Adolph Pollack, Antino ([id number]) Entered By: [name] on 10/28/2016 13:59:49 Shasteen, Ainsley ([id number]) -------------------------------------------------------------------------------- Wound Assessment Details Patient Name: [name] Date of Service: 10/28/2016 1:15 PM Medical Record Number: [id number] Patient Account Number: [id number] Date of Birth/Sex: [birth year] (60 y.o. Male) Treating RN: [name] Primary Care Physician: [name] Other Clinician: Referring Physician: [name] Treating Physician/Extender: [name] in Treatment: 29 Wound Status Wound Number: 5 Primary Etiology: Trauma, Other Wound Location: Right, Dorsal Foot Wound Status: Healed - Epithelialized Wounding Event: Shear/Friction Date Acquired: 10/08/2016 Weeks Of  Treatment: 2 Clustered Wound: No Photos Photo Uploaded By: [name] on 10/28/2016 14:49:15 Wound Measurements Length: (cm) 0 Width: (cm) 0 Depth: (cm) 0 Area: (cm) 0 Volume: (cm) 0 % Reduction in Area: 100% % Reduction in Volume: 100% Wound Description Classification: Partial Thickness Periwound Skin Texture Texture Color No Abnormalities Noted: No No Abnormalities Noted: No Moisture No Abnormalities Noted: No Electronic Signature(s) Signed: 10/28/2016 4:40:41 PM By: Adolph Pollack, Carla ([id number]) Entered By: [name] on 10/28/2016 13:59:50 Guidotti, Travers ([id number]) -------------------------------------------------------------------------------- Wound Assessment Details Patient Name: [name] Date of Service: 10/28/2016 1:15 PM Medical Record Number: [id number] Patient Account Number: [id number] Date of Birth/Sex: Oct 21, 1956 (60 y.o. Male) Treating RN: [name] Primary Care Physician: [name] Other Clinician: Referring Physician: [name] Treating Physician/Extender: [name] in Treatment: 29 Wound Status Wound Number: 6 Primary Etiology: Trauma, Other Wound Location: Left, Dorsal Foot Wound Status: Healed - Epithelialized Wounding Event: Shear/Friction Date Acquired: 10/08/2016 Weeks Of Treatment: 2 Clustered Wound: No Photos Photo Uploaded By: [name] on 10/28/2016 14:49:33 Wound Measurements Length: (cm) 0 Width: (cm) 0 Depth: (cm) 0 Area: (cm) 0 Volume: (cm) 0 % Reduction in Area: 100% % Reduction in Volume: 100% Wound Description Classification: Partial Thickness Periwound Skin Texture Texture Color No Abnormalities Noted: No No Abnormalities Noted: No Moisture No Abnormalities Noted: No Electronic Signature(s) Signed: 10/28/2016 4:40:41 PM By: Adolph Pollack, Vinicius ([id number]) Entered By: [name] on 10/28/2016 13:59:50 [name]  ([id number]) -------------------------------------------------------------------------------- Vitals Details Patient Name: [name] Date of Service: 10/28/2016 1:15 PM Medical Record Number: [id number] Patient Account Number: [id number] Date of Birth/Sex: Dec 10, 1955 (60 y.o. Male) Treating RN: [name] Primary Care Physician: [name] Other Clinician: Referring Physician: [name] Treating Physician/Extender: [name] in Treatment: 29 Vital Signs Time Taken: 13:36 Pulse (bpm): 56 Height (in): 70 Respiratory Rate (breaths/min): 16 Blood Pressure (mmHg): 109/69 Reference Range: 80 - 120 mg / dl Electronic Signature(s) Signed: 10/28/2016 4:40:41 PM By: [name] Entered By: [name] on 10/28/2016 13:36:09

## 2016-11-10 DIAGNOSIS — J9819 Other pulmonary collapse: Secondary | ICD-10-CM

## 2016-11-10 HISTORY — DX: Other pulmonary collapse: J98.19

## 2016-11-11 ENCOUNTER — Ambulatory Visit: Payer: Medicare Other | Admitting: Internal Medicine

## 2016-11-12 ENCOUNTER — Ambulatory Visit: Payer: Medicare Other | Admitting: Internal Medicine

## 2016-11-13 ENCOUNTER — Other Ambulatory Visit (INDEPENDENT_AMBULATORY_CARE_PROVIDER_SITE_OTHER): Payer: Self-pay | Admitting: Vascular Surgery

## 2016-11-19 ENCOUNTER — Encounter: Payer: Medicare Other | Attending: Internal Medicine | Admitting: Internal Medicine

## 2016-11-19 DIAGNOSIS — I70234 Atherosclerosis of native arteries of right leg with ulceration of heel and midfoot: Secondary | ICD-10-CM | POA: Insufficient documentation

## 2016-11-19 DIAGNOSIS — Z87891 Personal history of nicotine dependence: Secondary | ICD-10-CM | POA: Insufficient documentation

## 2016-11-19 DIAGNOSIS — I70245 Atherosclerosis of native arteries of left leg with ulceration of other part of foot: Secondary | ICD-10-CM | POA: Diagnosis not present

## 2016-11-19 DIAGNOSIS — Z8673 Personal history of transient ischemic attack (TIA), and cerebral infarction without residual deficits: Secondary | ICD-10-CM | POA: Insufficient documentation

## 2016-11-19 DIAGNOSIS — L89623 Pressure ulcer of left heel, stage 3: Secondary | ICD-10-CM | POA: Diagnosis not present

## 2016-11-19 DIAGNOSIS — L89613 Pressure ulcer of right heel, stage 3: Secondary | ICD-10-CM | POA: Diagnosis not present

## 2016-11-20 ENCOUNTER — Other Ambulatory Visit
Admission: RE | Admit: 2016-11-20 | Discharge: 2016-11-20 | Disposition: A | Discharge status: Home / Self Care | Payer: Medicare Other | Source: Ambulatory Visit | Attending: Internal Medicine | Admitting: Internal Medicine

## 2016-11-20 DIAGNOSIS — B999 Unspecified infectious disease: Secondary | ICD-10-CM | POA: Diagnosis present

## 2016-11-20 NOTE — Progress Notes (Signed)
[name], [name] ([id number]) Visit Report for 11/19/2016 Arrival Information Details Patient Name: [name] Date of Service: 11/19/2016 1:15 PM Medical Record Number: [id number] Patient Account Number: [id number] Date of Birth/Sex: [birth year] (61 y.o. Male) Treating RN: [name] Primary Care Physician: [name] Other Clinician: Referring Physician: [name] Treating Physician/Extender: [name] in Treatment: 57 Visit Information History Since Last Visit Added or deleted any medications: No Patient Arrived: Wheel Chair Any Larry allergies or adverse reactions: No Arrival Time: 13:20 Had a fall or experienced change in No activities of daily living that may affect Accompanied By: staff risk of falls: Transfer Assistance: Manual Signs or symptoms of abuse/neglect since last No Patient Identification Verified: Yes visito Secondary Verification Process Yes Hospitalized since last visit: No Completed: Pain Present Now: No Patient Requires Transmission-Based No Precautions: Patient Has Alerts: No Electronic Signature(s) Signed: 11/19/2016 5:40:28 PM By: [name] Entered By: [name] on 11/19/2016 13:21:16 [name] ([id number]) -------------------------------------------------------------------------------- Encounter Discharge Information Details Patient Name: [name] Date of Service: 11/19/2016 1:15 PM Medical Record Number: [id number] Patient Account Number: [id number] Date of Birth/Sex: 1955/12/18 (61 y.o. Male) Treating RN: [name] Primary Care Physician: [name] Other Clinician: Referring Physician: [name] Treating Physician/Extender: [name] in Treatment: 26 Encounter Discharge Information Items Discharge Pain Level: 0 Discharge Condition: Stable Ambulatory Status: Wheelchair Discharge Destination: Home Transportation: Private Auto Accompanied By: staff Schedule Follow-up  Appointment: Yes Medication Reconciliation completed and provided to Patient/Care No [name]: Provided on Clinical Summary of Care: 11/19/2016 Form Type Recipient Paper Patient DG Electronic Signature(s) Signed: 11/19/2016 5:08:22 PM By: [name] Previous Signature: 11/19/2016 2:11:35 PM Version By: [name] Entered By: [name] on 11/19/2016 17:08:21 [name] ([id number]) -------------------------------------------------------------------------------- Lower Extremity Assessment Details Patient Name: [name] Date of Service: 11/19/2016 1:15 PM Medical Record Number: [id number] Patient Account Number: [id number] Date of Birth/Sex: 02-24-1956 (61 y.o. Male) Treating RN: [name] Primary Care Physician: [name] Other Clinician: Referring Physician: [name] Treating Physician/Extender: [name] in Treatment: 32 Vascular Assessment Pulses: Dorsalis Pedis Palpable: [Left:Yes] Posterior Tibial Extremity colors, hair growth, and conditions: Extremity Color: [Left:Hyperpigmented] Hair Growth on Extremity: [Left:No] Temperature of Extremity: [Left:Warm] Capillary Refill: [Left:< 3 seconds] Electronic Signature(s) Signed: 11/19/2016 5:40:28 PM By: [name] Entered By: [name] on 11/19/2016 13:39:16 [name] ([id number]) -------------------------------------------------------------------------------- Multi Wound Chart Details Patient Name: [name] Date of Service: 11/19/2016 1:15 PM Medical Record Number: [id number] Patient Account Number: [id number] Date of Birth/Sex: 1956/08/17 (61 y.o. Male) Treating RN: [name] Primary Care Physician: [name] Other Clinician: Referring Physician: [name] Treating Physician/Extender: [name] in Treatment: 32 Vital Signs Height(in): 70 Pulse(bpm): 78 Weight(lbs): Blood Pressure 140/78 (mmHg): Body Mass  Index(BMI): Temperature(F): Respiratory Rate 16 (breaths/min): Photos: [N/A:N/A] Wound Location: Left Calcaneus N/A N/A Wounding Event: Pressure Injury N/A N/A Primary Etiology: Arterial Insufficiency Ulcer N/A N/A Secondary Etiology: Pressure Ulcer N/A N/A Comorbid History: Anemia N/A N/A Date Acquired: 02/07/2016 N/A N/A Weeks of Treatment: 32 N/A N/A Wound Status: Open N/A N/A Measurements L x W x D 0.1x0.1x0.1 N/A N/A (cm) Area (cm) : 0.008 N/A N/A Volume (cm) : 0.001 N/A N/A % Reduction in Area: 100.00% N/A N/A % Reduction in Volume: 100.00% N/A N/A Classification: Full Thickness Without N/A N/A Exposed Support Structures Exudate Amount: Large N/A N/A Exudate Type: Purulent N/A N/A Exudate Color: yellow, brown, [name] N/A N/A Wound Margin: Flat and Intact N/A N/A Granulation Amount: None Present (0%) N/A N/A Necrotic  Amount: Large (67-100%) N/A N/A [name] ([id number]) Necrotic Tissue: Eschar, Adherent Slough N/A N/A Exposed Structures: Fat: Yes N/A N/A Fascia: No Tendon: No Muscle: No Joint: No Bone: No Epithelialization: Small (1-33%) N/A N/A Debridement: Debridement [id number] N/A N/A [id number]) Pre-procedure 13:41 N/A N/A Verification/Time Out Taken: Pain Control: Lidocaine 4% Topical N/A N/A Solution Tissue Debrided: Necrotic/Eschar, N/A N/A Fibrin/Slough, Muscle, Exudates, Subcutaneous Level: Skin/Subcutaneous N/A N/A Tissue/Muscle Debridement Area (sq 2.4 N/A N/A cm): Instrument: Blade, Curette, Forceps N/A N/A Specimen: Swab N/A N/A Number of Specimens 1 N/A N/A Taken: Bleeding: Moderate N/A N/A Hemostasis Achieved: Silver Nitrate N/A N/A Procedural Pain: 0 N/A N/A Post Procedural Pain: 0 N/A N/A Debridement Treatment Procedure was tolerated N/A N/A Response: well Post Debridement 1.2x2x0.8 N/A N/A Measurements L x W x D (cm) Post Debridement 1.508 N/A N/A Volume: (cm) Periwound Skin Texture: Edema: No N/A N/A Excoriation:  No Induration: No Callus: No Crepitus: No Fluctuance: No Friable: No Rash: No Scarring: No Periwound Skin Moist: Yes N/A N/A Moisture: Dry/Scaly: Yes Maceration: No Periwound Skin Color: N/A N/A [name] ([id number]) Atrophie Blanche: No Cyanosis: No Ecchymosis: No Erythema: No Hemosiderin Staining: No Mottled: No Pallor: No Rubor: No Temperature: No Abnormality N/A N/A Tenderness on Yes N/A N/A Palpation: Wound Preparation: Ulcer Cleansing: N/A N/A Rinsed/Irrigated with Saline Topical Anesthetic Applied: Other: lidocaine 4% Procedures Performed: Debridement N/A N/A Treatment Notes Electronic Signature(s) Signed: 11/19/2016 6:05:53 PM By: Linton Ham MD Entered By: Linton Ham on 11/19/2016 13:57:17 Fruchter, Arran ([id number]) -------------------------------------------------------------------------------- Multi-Disciplinary Care Plan Details Patient Name: [name] Date of Service: 11/19/2016 1:15 PM Medical Record Number: [id number] Patient Account Number: [id number] Date of Birth/Sex: 29-Apr-1956 (61 y.o. Male) Treating RN: [name] Primary Care Physician: [name] Other Clinician: Referring Physician: [name] Treating Physician/Extender: [name] in Treatment: 2 Active Inactive Abuse / Safety / Falls / Self Care Management Nursing Diagnoses: Potential for falls Goals: Patient will remain injury free Date Initiated: 04/08/2016 Goal Status: Active Interventions: Assess fall risk on admission and as needed Notes: Nutrition Nursing Diagnoses: Imbalanced nutrition Goals: Patient/caregiver agrees to and verbalizes understanding of need to use nutritional supplements and/or vitamins as prescribed Date Initiated: 04/08/2016 Goal Status: Active Interventions: Assess patient nutrition upon admission and as needed per policy Notes: Orientation to the Wound Care Program Nursing Diagnoses: Knowledge deficit  related to the wound healing center program Goals: Patient/caregiver will verbalize understanding of the [place] ([id number]) Date Initiated: 04/08/2016 Goal Status: Active Interventions: Provide education on orientation to the wound center Notes: Pain, Acute or Chronic Nursing Diagnoses: Pain, acute or chronic: actual or potential Potential alteration in comfort, pain Goals: Patient will verbalize adequate pain control and receive pain control interventions during procedures as needed Date Initiated: 04/08/2016 Goal Status: Active Interventions: Assess comfort goal upon admission Complete pain assessment as per visit requirements Notes: Pressure Nursing Diagnoses: Knowledge deficit related to causes and risk factors for pressure ulcer development Knowledge deficit related to management of pressures ulcers Goals: Patient will remain free from development of additional pressure ulcers Date Initiated: 04/08/2016 Goal Status: Active Interventions: Assess offloading mechanisms upon admission and as needed Assess potential for pressure ulcer upon admission and as needed Notes: Wound/Skin Impairment Nursing Diagnoses: Impaired tissue integrity Findling, Monterio ([id number]) Goals: Ulcer/skin breakdown will have a volume reduction of 30% by week 4 Date Initiated: 04/08/2016 Goal Status: Active Ulcer/skin breakdown will have a volume reduction of 50% by week 8 Date Initiated: 04/08/2016 Goal Status: Active Ulcer/skin  breakdown will have a volume reduction of 80% by week 12 Date Initiated: 04/08/2016 Goal Status: Active Interventions: Assess ulceration(s) every visit Notes: Electronic Signature(s) Signed: 11/19/2016 5:40:28 PM By: [name] Entered By: [name] on 11/19/2016 13:39:22 Calandra, Lorenso Hughes ([id number]) -------------------------------------------------------------------------------- Pain Assessment Details Patient Name: [name] Date of Service: 11/19/2016 1:15 PM Medical Record Number: [id number] Patient Account Number: [id number] Date of Birth/Sex: [birth year] (61 y.o. Male) Treating RN: [name] Primary Care Physician: [name] Other Clinician: Referring Physician: [name] Treating Physician/Extender: [name] in Treatment: 32 Active Problems Location of Pain Severity and Description of Pain Patient Has Paino No Site Locations Pain Management and Medication Current Pain Management: Notes Topical or injectable lidocaine is offered to patient for acute pain when surgical debridement is performed. If needed, Patient is instructed to use over the counter pain medication for the following 24-48 hours after debridement. Wound care MDs do not prescribed pain medications. Patient has chronic pain or uncontrolled pain. Patient has been instructed to make an appointment with their Primary Care Physician for pain management. Electronic Signature(s) Signed: 11/19/2016 5:40:28 PM By: [name] Entered By: [name] on 11/19/2016 13:21:29 [name] ([id number]) -------------------------------------------------------------------------------- Patient/Caregiver Education Details Patient Name: [name] Date of Service: 11/19/2016 1:15 PM Medical Record Number: [id number] Patient Account Number: [id number] Date of Birth/Gender: 1956/03/31 (61 y.o. Male) Treating RN: [name] Primary Care Physician: [name] Other Clinician: Referring Physician: [name] Treating Physician/Extender: [name] in Treatment: 25 Education Assessment Education Provided To: Patient and Caregiver Education Topics Provided Wound/Skin Impairment: Handouts: Other: wound care as ordered Methods: Demonstration, Explain/Verbal Responses: State content correctly Electronic Signature(s) Signed: 11/19/2016 5:40:28 PM By: [name] Entered By: [name] on 11/19/2016 17:08:42 Blevens, Lorenso Hughes ([id number]) -------------------------------------------------------------------------------- Wound Assessment Details Patient Name: [name] Date of Service: 11/19/2016 1:15 PM Medical Record Number: [id number] Patient Account Number: [id number] Date of Birth/Sex: 10/27/1956 (61 y.o. Male) Treating RN: [name] Primary Care Physician: [name] Other Clinician: Referring Physician: [name] Treating Physician/Extender: [name] in Treatment: 32 Wound Status Wound Number: 3 Primary Etiology: Arterial Insufficiency Ulcer Wound Location: Left Calcaneus Secondary Etiology: Pressure Ulcer Wounding Event: Pressure Injury Wound Status: Open Date Acquired: 02/07/2016 Comorbid History: Anemia Weeks Of Treatment: 32 Clustered Wound: No Photos Wound Measurements Length: (cm) 1.2 Width: (cm) 2 Depth: (cm) 0.1 Area: (cm) 1.885 Volume: (cm) 0.188 % Reduction in Area: 91.2% % Reduction in Volume: 91.2% Epithelialization: Small (1-33%) Tunneling: No Undermining: No Wound Description Full Thickness Without Exposed Classification: Support Structures Wound Margin: Flat and Intact Exudate Large Amount: Exudate Type: Purulent Exudate Color: yellow, brown, Wiegel Foul Odor After Cleansing: No Wound Bed Granulation Amount: None Present (0%) Exposed Structure Necrotic Amount: Large (67-100%) Fascia Exposed: No Necrotic Quality: Eschar, Adherent Slough Fat Layer Exposed: Yes Desmith, Corey ([id number]) Tendon Exposed: No Muscle Exposed: No Joint Exposed: No Bone Exposed: No Periwound Skin Texture Texture Color No Abnormalities Noted: No No Abnormalities Noted: No Callus: No Atrophie Blanche: No Crepitus: No Cyanosis: No Excoriation: No Ecchymosis: No Fluctuance: No Erythema: No Friable: No Hemosiderin Staining: No Induration: No Mottled: No Localized Edema: No Pallor: No Rash:  No Rubor: No Scarring: No Temperature / Pain Moisture Temperature: No Abnormality No Abnormalities Noted: No Tenderness on Palpation: Yes Dry / Scaly: Yes Maceration: No Moist: Yes Wound Preparation Ulcer Cleansing: Rinsed/Irrigated with Saline Topical Anesthetic Applied: Other: lidocaine 4%, Treatment Notes Wound #3 (Left Calcaneus) 1. Cleansed with: Clean wound with Normal  Saline 2. Anesthetic Topical Lidocaine 4% cream to wound bed prior to debridement 4. Dressing Applied: Aquacel Ag 5. Secondary Dressing Applied Guaze, ABD and kerlix/Conform 7. Secured with Tape Notes netting Electronic Signature(s) Signed: 11/19/2016 5:40:28 PM By: [name] Entered By: [name] on 11/19/2016 17:25:50 BURKLEY, WONDRA ([id number]) BRAYLIN, DEWATER ([id number]) -------------------------------------------------------------------------------- Vitals Details Patient Name: [name] Date of Service: 11/19/2016 1:15 PM Medical Record Number: [id number] Patient Account Number: [id number] Date of Birth/Sex: 1956-03-10 (61 y.o. Male) Treating RN: [name] Primary Care Physician: [name] Other Clinician: Referring Physician: [name] Treating Physician/Extender: [name] in Treatment: 32 Vital Signs Time Taken: 13:22 Pulse (bpm): 78 Height (in): 70 Respiratory Rate (breaths/min): 16 Blood Pressure (mmHg): 140/78 Reference Range: 80 - 120 mg / dl Electronic Signature(s) Signed: 11/19/2016 5:40:28 PM By: [name] Entered By: [name] on 11/19/2016 13:24:04

## 2016-11-20 NOTE — Progress Notes (Signed)
CLABORN, KROLCZYK ([id number]) Visit Report for 11/19/2016 Chief Complaint Document Details Patient Name: [name], [name] Date of Service: 11/19/2016 1:15 PM Medical Record Patient Account Number: [id number] Number: Treating RN: [name] 1956-11-10 (61 y.o. Other Clinician: Date of Birth/Sex: Male) Treating Fruma [place] Primary Care Physician: [name] Physician/Extender: G Referring Physician: Claudie Revering in Treatment: 32 Information Obtained from: Patient Chief Complaint here for follow up on bilateral heel pressure ulcers Electronic Signature(s) Signed: 11/19/2016 6:05:53 PM By: Linton Ham MD Entered By: Linton Ham on 11/19/2016 13:57:53 Natarajan, Timofey ([id number]) -------------------------------------------------------------------------------- Debridement Details Patient Name: [name] Date of Service: 11/19/2016 1:15 PM Medical Record Patient Account Number: [id number] Number: Treating RN: [name] Mar 12, 1956 (61 y.o. Other Clinician: Date of Birth/Sex: Male) Treating Kenlie Seki, [place] Primary Care Physician: [name] Physician/Extender: G Referring Physician: Claudie Revering in Treatment: 32 Debridement Performed for Wound #3 Left Calcaneus Assessment: Performed By: Physician [name], MD Debridement: Debridement Pre-procedure Yes - 13:41 Verification/Time Out Taken: Start Time: 13:41 Pain Control: Lidocaine 4% Topical Solution Level: Skin/Subcutaneous Tissue/Muscle Total Area Debrided (L x 1.2 (cm) x 2 (cm) = 2.4 (cm) W): Tissue and other Viable, Non-Viable, Eschar, Exudate, Fibrin/Slough, Muscle, Subcutaneous material debrided: Instrument: Blade, Curette, Forceps Specimen: Swab Number of Specimens 1 Taken: Bleeding: Moderate Hemostasis Achieved: Silver Nitrate End Time: 13:50 Procedural Pain: 0 Post Procedural Pain: 0 Response to Treatment: Procedure was tolerated well Post  Debridement Measurements of Total Wound Length: (cm) 1.2 Width: (cm) 2 Depth: (cm) 0.8 Volume: (cm) 1.508 Character of Wound/Ulcer Post Improved Debridement: Severity of Tissue Post Debridement: Necrosis of muscle Post Procedure Diagnosis Same as Pre-procedure JUANDANIEL, NELMS ([id number]) Electronic Signature(s) Signed: 11/19/2016 5:40:28 PM By: [name] Signed: 11/19/2016 6:05:53 PM By: Linton Ham MD Entered By: Linton Ham on 11/19/2016 13:57:37 Monfils, Izek ([id number]) -------------------------------------------------------------------------------- HPI Details Patient Name: [name] Date of Service: 11/19/2016 1:15 PM Medical Record Patient Account Number: [id number] Number: Treating RN: [name] December 28, 1955 (61 y.o. Other Clinician: Date of Birth/Sex: Male) Treating Dannel Rafter Primary Care Physician: [name] Physician/Extender: G Referring Physician: Claudie Revering in Treatment: 32 History of Present Illness HPI Description: 04/08/16; this is a patient we really don't know too much about. He was hospitalized from 3/24 through 4/6 residing with a sodium of 167 creatinine of 2.49 right lower lobe pneumonia sepsis/septic shock syndrome. He apparently came back to the [facility] He apparently had been there only 2 weeks before he went out. I know very little about him premorbidly. The hospital he had a CT scan of the head that showed atrophy and chronic small vessel white matter ischemic changes and remote infarcts also noted within the bilateral basal ganglia. His echocardiogram and presentation showed a EF of 20-25%, left ventricle was moderately dilated with diffuse hypokinesis. Apparently he return to his assisted living with wounds on his bilateral feet. According to the attendant came with him he could walk before he went out he could no longer walk now. There are  wounds on both heels covered with a black eschar and also wounds on his bilateral dorsal feet. ABIs calculated in our clinic for 0.49 bilaterally. He was a smoker up to 2 months ago he is not smoking now. 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 Monophasic waves  distally bilaterally. He is going back at the end of the month I believe June 29 probably for an arteriogram as arranged by Dr. Delana Meyer in the meantime he has had no real changes 04/30/16; we are waiting vascular review on June 29. The black eschar over his bilateral heels is beginning to separate using Santyl. The areas on the dorsal aspect of his feet looked considerably better/resolved. He apparently has no complaints. He also has a severe presumably ischemic cardiomyopathy as noted above 05/21/16:pt returns today for ongoing evaluation of wounds. I reviewed recent venous and arterial diagnostic findings. He is scheduled for a LLE angio on 05/26/16 and RLE angio on 06/02/16 for advanced evaluation of PAD as noted per lower extremity arterial duplex. BLE venous duplex was negative for venous reflux, DVT or insufficiency. 06/10/16; this is a patient I have not seen in over a month although he was seen here on 7/12. When I admitted him to our clinic I felt he had pressure ulcerations but also potential for limb threatening ischemia. I referred him urgently to vascular surgery. Since we have last seen him in this clinic he has undergone 2 procedures. On 7/17 he will underwent percutaneous transluminal angioplasty of the left above-knee popliteal artery as well as the mid to distal SFA. On 7/27 he underwent for cutaneous transluminal angioplasty of the right popliteal artery and SFA an angioplasty of the proximal SFA. He had a stent placement in the right popliteal artery.  The patient is difficult to gauge symptomatology although he does not currently complain of pain. Our intake nurse reports purulent drainage out of the left heel and an odor. 06/17/2016 -- right heel x-ray and left heel x-ray -- IMPRESSION:No radiographic evidence of osteomyelitis. Soft tissue changes are Rinella, Jameer ([id number]) consistent with cellulitis. He had a wound culture which is positive for abundant Citrobacter fundi and pseudomonas aeruginosa. Both are sensitive to ciprofloxacin and at the present time the patient is on doxycycline 100 mg, prescribed by Dr. Dellia Nims last week. I will prescribe ciprofloxacin for him today. 07/16/16 it is been quite a while since I've seen these wounds however both of them are quite a bit better. Using [place], being followed by [facility]. 07/30/16; I see this man every 2 weeks. He had ischemic wounds on his bilateral heels. He has been using Santyl with [facility] home [facility] coming out to his facility 08/12/16 Patient comes in today for a follow-up visit we see him every 2 weeks at this point in time. He has bilateral heel wounds the we have been applying Santyl to and he has home health coming out to this facility for these dressing changes. These wounds seem to be improving dramatically since he had the procedures with vascular to stent and improve his blood flow in the bilateral lower extremities. Currently he has been responding well to debridement with Dr. Dellia Nims as well. He tells me at this point he really has no sniffing discomfort or pain at this time. He is tolerating the dressing changes well at this point as well. 08/26/16; patient comes in for his two-week follow-up. He has bilateral heel pressure ulcers that were complicated by severe ischemia now corrected. Has been using Santyl to these areas. 09/17/2016 - Mr. Desocio arrives today with an assistance from the facility that he resides at. Mr. Zunk offers no complaints, answering most  questions "no" or "okay"; most information obtained from the assistant. Heel protectors are now only being worn at night, not throughout the day  and the patient has a history of resting heels directly on the ground throughout the day versus on the foot rests of the wheelchair (R>L). Home health continues to provide dressing changes. The caregiver/attendant was encouraged to bring in heel protectors to next weeks visit for evaluation in addition, the patient and the attendant were encouraged to wear to heel protector at all times. Mr Babineau remains non-ambulatory 10/08/16; patient arrives today in 3 week follow-up. When he was last seen here our staff and providers felt that he was well on his way to healing. He has home health changing the dressings. He arrives today with an odor coming out of the left heel that smelt like "rotten potatoes" according to our intake nurse. He is not been systemically unwell. The individual who accompanies him from the facility states that he uses his body boots at night but sits in the wheelchair rubbing his heal up and down on the dorsal aspect of the other foot. 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 Now status post revascularization. He had been doing quite nicely however last week presented with a very significant deterioration in the bilateral ulcers. Especially the area on the left had an older. CandS showed abundant Pseudomonas and Proteus. I empirically put him on ciprofloxacin although the Proteus was only intermediate in terms of its sensitivity. 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 Now status post revascularization he had been doing quite well and most of [place] felt these were going to heal however he developed recurrent trauma caused by rubbing  his heels against the dorsal aspect of the other foot he subsequently developed an infection with Proteus and Pseudomonas. I gave him Cipro however the protein was was only intermediately sensitive. I therefore have him on a 10 day course of Cefdinir 10/29/16; follow-up of bilateral heel ulcers which were initially a combination of pressure and severe PAD. He is now status post revascularization and has been doing quite well. In fact the right heel is healed. He has a continued problem with the left heel. He should be just about completing antibiotics. 11/19/16; follow-up bilateral heel ulcers which were initially a combination of pressure and severe PAD. He is now status post revascularization on the right heel is actually closed and we had close this over one to 2 weeks ago. Following up on the left heel today Electronic Signature(s) ADRIAAN, ZETTLE ([id number]) Signed: 11/19/2016 6:05:53 PM By: Linton Ham MD Entered By: Linton Ham on 11/19/2016 13:58:32 [name] ([id number]) -------------------------------------------------------------------------------- Physical Exam Details Patient Name: [name] Date of Service: 11/19/2016 1:15 PM Medical Record Patient Account Number: [id number] Number: Treating RN: [name] August 20, 1956 (60 y.o. Other Clinician: Date of Birth/Sex: Male) Treating Nolie Bignell Primary Care Physician: [name] Physician/Extender: G Referring Physician: Claudie Revering in Treatment: 32 Constitutional Sitting or standing Blood Pressure is within target range for patient.. Pulse regular and within target range for patient.Marland Kitchen Respirations regular, non-labored and within target range.. Temperature is normal and within the target range for the patient.. Patient's appearance is neat and clean. Appears in no acute distress. Well nourished and well developed.. Notes Wound exam; still had a small open area with a surface eschar. I  removed this with a #3 curet to reveal a relatively large amount of purulent pus coming out  of this area. After the area was fully drained and debrided of necrotic material including necrotic muscle. The base of the wound looks reasonably stable. There is no overt infection in the soft tissue around the wound and the wound does not probe to bone Electronic Signature(s) Signed: 11/19/2016 6:05:53 PM By: Linton Ham MD Entered By: Linton Ham on 11/19/2016 13:59:46 [name] ([id number]) -------------------------------------------------------------------------------- Physician Orders Details Patient Name: [name] Date of Service: 11/19/2016 1:15 PM Medical Record Patient Account Number: [id number] Number: Treating RN: [name] July 15, 1956 (60 y.o. Other Clinician: Date of Birth/Sex: Male) Treating Seryna Marek Primary Care Physician: [name] Physician/Extender: G Referring Physician: Claudie Revering in Treatment: 49 Verbal / Phone Orders: Yes Clinician: [name] Read Back and Verified: Yes Diagnosis Coding Wound Cleansing Wound #3 Left Calcaneus o Clean wound with Normal Saline. o Cleanse wound with mild soap and water - HHRN please was feet and legs with mild soap and water and dry. Anesthetic Wound #3 Left Calcaneus o Topical Lidocaine 4% cream applied to wound bed prior to debridement - office use only Skin Barriers/Peri-Wound Care Wound #3 Left Calcaneus o Moisturizing lotion - Please put lotion on pts legs and feet***do not put on wound or between toes*** Primary Wound Dressing Wound #3 Left Calcaneus o Aquacel Ag o Other: - foam Secondary Dressing Wound #3 Left Calcaneus o Dry Gauze o Conform/Kerlix - tape, netting o Foam - heel cups to heels Dressing Change Frequency Wound #3 Left Calcaneus o Change dressing every other day. Follow-up Appointments Wound #3 Left Calcaneus o Return  Appointment in 1 week. Cornette, Claus ([id number]) Off-Loading Wound #3 Left Calcaneus o Heel suspension boot to: - pt to wear sage boots, please float heels o Turn and reposition every 2 hours o Other: - Please use foam to protect the top of both feet Additional Orders / Instructions Wound #3 Left Calcaneus o Increase protein intake. Home Health Wound #3 Left Calcaneus o Continue Home Health Visits - [place] Nurse may visit PRN to address patientos wound care needs. o FACE TO FACE ENCOUNTER: MEDICARE and MEDICAID PATIENTS: I certify that this patient is under my care and that I had a face-to-face encounter that meets the physician face-to-face encounter requirements with this patient on this date. The encounter with the patient was in whole or in part for the following MEDICAL CONDITION: (primary reason for [place]) MEDICAL NECESSITY: I certify, that based on my findings, NURSING services are a medically necessary home health service. HOME BOUND STATUS: I certify that my clinical findings support that this patient is homebound (i.e., Due to illness or injury, pt requires aid of supportive devices such as crutches, cane, wheelchairs, walkers, the use of special transportation or the assistance of another person to leave their place of residence. There is a normal inability to leave the home and doing so requires considerable and taxing effort. Other absences are for medical reasons / religious services and are infrequent or of short duration when for other reasons). o If current dressing causes regression in wound condition, may D/C ordered dressing product/s and apply Normal Saline Moist Dressing daily until next [place] / Other MD appointment. [place] of regression in wound condition at [phone]. o Please direct any NON-WOUND related issues/requests for orders to patient's Primary  Care Physician Medications-please add to medication list. Wound #3 Left Calcaneus o P.O. Antibiotics - Cefinir o Other: - Vitamin C, Zinc, Multivitamin Laboratory o  Bacteria identified in Wound by Culture (MICRO) - left heel oooo LOINC Code: [id number] oooo Convenience Name: Wound culture routine Patient Medications Allergies: NKDA Toscano, Jeramyah ([id number]) Notifications Medication Indication Start End cefdinir 11/19/2016 DOSE bid - oral 300 mg capsule - bid capsule oral Electronic Signature(s) Signed: 11/19/2016 2:04:52 PM By: Linton Ham MD Entered By: Linton Ham on 11/19/2016 14:04:51 Farrey, [name] ([id number]) -------------------------------------------------------------------------------- Prescription 11/19/2016 Patient Name: [name] Physician: [name] MD Date of Birth: 1955/11/24 NPI#: [id number] Sex: M DEA#: [id number] Phone #: [phone] License #: [id number] Patient Address: [address], [place] [address] [facility] [address], [place], [place] [address] [phone] Allergies NKDA 32 Orders P.O. Antibiotics - Cefinir Signature(s): Date(s): Electronic Signature(s) Signed: 11/19/2016 6:05:53 PM By: Linton Ham MD Entered By: Linton Ham on 11/19/2016 14:04:53 Whittier, [name] ([id number]) --------------------------------------------------------------------------------  Problem List Details Patient Name: [name] Date of Service: 11/19/2016 1:15 PM Medical Record Patient Account Number: [id number] Number: Treating RN: [name] 1956/01/18 (60 y.o. 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.245 Atherosclerosis of native arteries of left leg with ulceration 04/08/2016 Yes of other part of foot Inactive Problems Resolved Problems ICD-10 Code Description Active Date Resolved Date L89.610 Pressure ulcer of right heel, unstageable 04/08/2016 04/08/2016 L89.620 Pressure ulcer of left heel, unstageable 04/08/2016 04/08/2016 Electronic Signature(s) Signed: 11/19/2016 6:05:53 PM By: Linton Ham MD Entered By: Linton Ham on 11/19/2016 13:57:08 Baer, Mory ([id number]) [name] ([id number]) -------------------------------------------------------------------------------- Progress Note Details Patient Name: [name] Date of Service: 11/19/2016 1:15 PM Medical Record Patient Account Number: [id number] Number: Treating RN: [name] [birth year] (60 y.o. Other Clinician: Date of Birth/Sex: Male) Treating Dmitry Macomber Primary Care Physician: [name] Physician/Extender: G Referring Physician: Claudie Revering in Treatment: 73 Subjective Chief Complaint Information obtained from Patient here for follow up on bilateral heel pressure ulcers History of Present Illness (HPI) 04/08/16; this is a patient we really don't know too much about. He was hospitalized from 3/24 through 4/6 residing with a sodium of 167 creatinine of 2.49 right lower lobe pneumonia sepsis/septic shock syndrome. He apparently came back to the [facility] He apparently had been there only 2 weeks before he went out. I know very little about him premorbidly. The hospital he had a CT scan of the head that showed atrophy and chronic small vessel white matter ischemic changes and  remote infarcts also noted within the bilateral basal ganglia. His echocardiogram and presentation showed a EF of 20-25%, left ventricle was moderately dilated with diffuse hypokinesis. Apparently he return to his assisted living with wounds on his bilateral feet. According to the attendant came with him he could walk before he went out he could no longer walk now. There are wounds on both heels covered with a black eschar and also wounds on his bilateral dorsal feet. ABIs calculated in our clinic for 0.49 bilaterally. He was a smoker up to 2 months ago he is not smoking now. 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 Monophasic waves distally bilaterally. He is going back at the end of the month I believe June 29 probably for an arteriogram as arranged by Dr. Delana Meyer in the meantime he has had no real changes 04/30/16; we are waiting vascular review on June 29. The black eschar over his bilateral heels is beginning to separate using Santyl. The areas on the dorsal aspect of his feet looked considerably better/resolved. He apparently has no complaints. He also has a severe presumably ischemic cardiomyopathy as noted above 05/21/16:pt returns today for ongoing evaluation of wounds. I reviewed recent venous and arterial diagnostic findings. He is scheduled for a LLE angio on 05/26/16 and RLE angio on 06/02/16 for advanced evaluation of PAD as noted per lower extremity arterial duplex. BLE venous duplex was negative for venous reflux, DVT or insufficiency. 06/10/16; this is a patient I have not seen in over a month although he was seen here on 7/12. When I admitted him to our clinic I felt he had pressure ulcerations but also potential for limb threatening ischemia. I referred him urgently to vascular surgery. Since we have last seen him  in this clinic he has undergone 2 procedures. On 7/17 he will underwent percutaneous transluminal angioplasty of the left above-knee popliteal artery as well as the mid to distal SFA. On 7/27 he underwent for cutaneous transluminal angioplasty of the right popliteal artery and SFA an angioplasty of the proximal SFA. He had a stent Bourdon, Manu ([id number]) placement in the right popliteal artery. The patient is difficult to gauge symptomatology although he does not currently complain of pain. Our intake nurse reports purulent drainage out of the left heel and an odor. 06/17/2016 -- right heel x-ray and left heel x-ray -- IMPRESSION:No radiographic evidence of osteomyelitis. Soft tissue changes are consistent with cellulitis. He had a wound culture which is positive for abundant Citrobacter fundi and pseudomonas aeruginosa. Both are sensitive to ciprofloxacin and at the present time the patient is on doxycycline 100 mg, prescribed by Dr. Dellia Nims last week. I will prescribe ciprofloxacin for him today. 07/16/16 it is been quite a while since I've seen these wounds however both of them are quite a bit better. Using [place], being followed by [facility]. 07/30/16; I see this man every 2 weeks. He had ischemic wounds on his bilateral heels. He has been using Santyl with advanced home health coming out to his facility 08/12/16 Patient comes in today for a follow-up visit we see him every 2 weeks at this point in time. He has bilateral heel wounds the we have been applying Santyl to and he has home health coming out to this facility for these dressing changes. These wounds seem to be improving dramatically since he had the procedures with vascular to stent and improve his blood flow in the bilateral lower extremities. Currently he has been responding well to debridement with Dr. Dellia Nims as well. He tells me at this point he really has no sniffing discomfort or pain at this time. He is tolerating the  dressing changes well at this point as well. 08/26/16; patient comes in for his two-week follow-up. He has bilateral heel pressure ulcers that were complicated by severe ischemia now corrected. Has been using Santyl to these areas. 09/17/2016 - Mr. Betker arrives today with an assistance from the facility that he resides at. Mr. Daurio offers no complaints, answering  most questions "no" or "okay"; most information obtained from the assistant. Heel protectors are now only being worn at night, not throughout the day and the patient has a history of resting heels directly on the ground throughout the day versus on the foot rests of the wheelchair (R>L). Home health continues to provide dressing changes. The caregiver/attendant was encouraged to bring in heel protectors to next weeks visit for evaluation in addition, the patient and the attendant were encouraged to wear to heel protector at all times. Mr Litaker remains non-ambulatory 10/08/16; patient arrives today in 3 week follow-up. When he was last seen here our staff and providers felt that he was well on his way to healing. He has home health changing the dressings. He arrives today with an odor coming out of the left heel that smelt like "rotten potatoes" according to our intake nurse. He is not been systemically unwell. The individual who accompanies him from the facility states that he uses his body boots at night but sits in the wheelchair rubbing his heal up and down on the dorsal aspect of the other foot. 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 Now status post revascularization. He had been doing quite nicely however last week presented with a very significant deterioration in the bilateral ulcers. Especially the area on the left had an older. CandS showed abundant Pseudomonas and Proteus. I empirically put him on ciprofloxacin  although the Proteus was only intermediate in terms of its sensitivity. 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 Now status post revascularization he had been doing quite well and most of [place] felt these were going to heal however he developed recurrent trauma caused by rubbing his heels against the dorsal aspect of the other foot he subsequently developed an infection with Proteus and Pseudomonas. I gave him Cipro however the protein was was only intermediately sensitive. I therefore have him on a 10 day course of Cefdinir 10/29/16; follow-up of bilateral heel ulcers which were initially a combination of pressure and severe PAD. He is now status post revascularization and has been doing quite well. In fact the right heel is healed. He has a continued problem with the left heel. He should be just about completing antibiotics. 11/19/16; follow-up bilateral heel ulcers which were initially a combination of pressure and severe PAD. He is Adee, Andron ([id number]) now status post revascularization on the right heel is actually closed and we had close this over one to 2 weeks ago. Following up on the left heel today Objective Constitutional Sitting or standing Blood Pressure is within target range for patient.. Pulse regular and within target range for patient.Marland Kitchen Respirations regular, non-labored and within target range.. Temperature is normal and within the target range for the patient.. Patient's appearance is neat and clean. Appears in no acute distress. Well nourished and well developed.. Vitals Time Taken: 1:22 PM, Height: 70 in, Pulse: 78 bpm, Respiratory Rate: 16 breaths/min, Blood Pressure: 140/78 mmHg. General Notes: Wound exam; still had a small open area with a surface eschar. I removed this with a #3 curet to reveal a relatively large amount of purulent pus coming out of this area. After the  area was fully drained and debrided of necrotic material including necrotic muscle. The base of the wound looks reasonably stable. There is no  overt infection in the soft tissue around the wound and the wound does not probe to bone Integumentary (Hair, Skin) Wound #3 status is Open. Original cause of wound was Pressure Injury. The wound is located on the Left Calcaneus. The wound measures 0.1cm length x 0.1cm width x 0.1cm depth; 0.008cm^2 area and 0.001cm^3 volume. There is fat exposed. There is no tunneling or undermining noted. There is a large amount of purulent drainage noted. The wound margin is flat and intact. There is no granulation within the wound bed. There is a large (67-100%) amount of necrotic tissue within the wound bed including Eschar and Adherent Slough. The periwound skin appearance exhibited: Dry/Scaly, Moist. The periwound skin appearance did not exhibit: Callus, Crepitus, Excoriation, Fluctuance, Friable, Induration, Localized Edema, Rash, Scarring, Maceration, Atrophie Blanche, Cyanosis, Ecchymosis, Hemosiderin Staining, Mottled, Pallor, Rubor, Erythema. Periwound temperature was noted as No Abnormality. The periwound has tenderness on palpation. 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 There was a Skin/Subcutaneous Tissue/Muscle Debridement [id number]) debridement with total area of 2.4 sq cm performed by [name], MD. with the following instrument(s): Blade, Curette, and Forceps to remove Viable and Non-Viable tissue/material including Exudate, Fibrin/Slough,  Muscle, Eschar, and Subcutaneous after achieving pain control using Lidocaine 4% Topical Solution. 1 Specimen was taken by a Swab and sent to the lab per facility protocol.A time out was conducted at 13:41, prior to the start of the procedure. A Moderate amount of bleeding was controlled with Silver Nitrate. The procedure was tolerated well with a pain level of 0 throughout and a pain level of 0 following the procedure. Post Debridement Measurements: 1.2cm length x 2cm width x 0.8cm depth; 1.508cm^3 volume. Character of Wound/Ulcer Post Debridement is improved. Severity of Tissue Post Debridement is: Necrosis of muscle. Post procedure Diagnosis Wound #3: Same as Pre-Procedure Plan Wound Cleansing: Wound #3 Left Calcaneus: Clean wound with Normal Saline. Cleanse wound with mild soap and water - HHRN please was feet and legs with mild soap and water and dry. Anesthetic: Wound #3 Left Calcaneus: Topical Lidocaine 4% cream applied to wound bed prior to debridement - office use only Skin Barriers/Peri-Wound Care: Wound #3 Left Calcaneus: Moisturizing lotion - Please put lotion on pts legs and feet***do not put on wound or between toes*** Primary Wound Dressing: Wound #3 Left Calcaneus: Aquacel Ag Other: - foam Secondary Dressing: Wound #3 Left Calcaneus: Dry Gauze Conform/Kerlix - tape, netting Foam - heel cups to heels Stickles, Jolan ([id number]) Dressing Change Frequency: Wound #3 Left Calcaneus: Change dressing every other day. Follow-up Appointments: Wound #3 Left Calcaneus: Return Appointment in 1 week. Off-Loading: Wound #3 Left Calcaneus: Heel suspension boot to: - pt to wear sage boots, please float heels Turn and reposition every 2 hours Other: - Please use foam to protect the top of both feet Additional Orders / Instructions: Wound #3 Left Calcaneus: Increase protein intake. Home Health: Wound #3 Left Calcaneus: Continue Home Health Visits - [place] Nurse may visit PRN to address patient s wound care needs. FACE TO FACE ENCOUNTER: MEDICARE and MEDICAID PATIENTS: I certify that this patient is  under my care and that I had a face-to-face encounter that meets the physician face-to-face encounter requirements with this patient on this date. The encounter with the patient was in whole or in part for the following MEDICAL CONDITION: (primary reason for [place]) MEDICAL NECESSITY: I certify, that based on my findings, NURSING services are a medically necessary home health service. HOME BOUND STATUS: I certify that my clinical findings support that this patient is homebound (i.e., Due to illness or injury, pt requires aid of supportive devices such as crutches, cane, wheelchairs, walkers, the use of special transportation or the assistance of another person to leave their place of residence. There is a normal inability to leave the home and doing so requires considerable and taxing effort. Other absences are for medical reasons / religious services and are infrequent or of short duration when for other reasons). If current dressing causes regression in wound condition, may D/C ordered dressing product/s and apply Normal Saline Moist Dressing daily until next [place] / Other MD appointment. Star of regression in wound condition at [phone]. Please direct any NON-WOUND related issues/requests for orders to patient's Primary Care Physician Medications-please add to medication list.: Wound #3 Left Calcaneus: P.O. Antibiotics - Cefinir Other: - Vitamin C, Zinc, Multivitamin Laboratory ordered were: Wound culture routine - left heel wound infection left heel. C+S done last cutlure review showed pseudomonas and proteus with the proteus only showing intermediate sensistivity to cipro. I gave him a 10 daY COURSE OF cefdinir. Armitage, Edson ([id number]) we will change his dressing to silver alginate.  7days of emperic cefdinir. I will not be easy to get this patient through an xray letalone a MRI Electronic Signature(s) Signed: 11/19/2016 6:05:53 PM By: Linton Ham MD Entered By: Linton Ham on 11/19/2016 14:02:33 [name] ([id number]) -------------------------------------------------------------------------------- SuperBill Details Patient Name: [name] Date of Service: 11/19/2016 Medical Record Patient Account Number: [id number] Number: Treating RN: [name] Aug 07, 1956 (60 y.o. 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

## 2016-11-23 LAB — AEROBIC CULTURE  (SUPERFICIAL SPECIMEN)

## 2016-11-23 LAB — AEROBIC CULTURE W GRAM STAIN (SUPERFICIAL SPECIMEN): Gram Stain: NONE SEEN

## 2016-11-26 ENCOUNTER — Ambulatory Visit: Payer: Medicare Other | Admitting: Internal Medicine

## 2016-12-03 ENCOUNTER — Encounter: Payer: Medicare Other | Admitting: Internal Medicine

## 2016-12-03 DIAGNOSIS — I70234 Atherosclerosis of native arteries of right leg with ulceration of heel and midfoot: Secondary | ICD-10-CM | POA: Diagnosis not present

## 2016-12-04 NOTE — Progress Notes (Signed)
Larry, Hughes ([id number]) Visit Report for 12/03/2016 Arrival Information Details Patient Name: [name], [name] Date of Service: 12/03/2016 2:45 PM Medical Record Number: [id number] Patient Account Number: [id number] Date of Birth/Sex: [birth year] (61 y.o. Male) Treating RN: Cornell Barman Primary Care Baya Lentz: [name] Other Clinician: Referring Elzie Knisley: [name] Treating Bingham Millette/Extender: [name] in Treatment: 94 Visit Information History Since Last Visit Added or deleted any medications: No Patient Arrived: Wheel Chair Any new allergies or adverse reactions: No Arrival Time: 14:55 Had a fall or experienced change in No activities of daily living that may affect Accompanied By: caregiver risk of falls: Transfer Assistance: Manual Signs or symptoms of abuse/neglect since last No Patient Identification Verified: Yes visito Secondary Verification Process Yes Has Dressing in Place as Prescribed: Yes Completed: Pain Present Now: No Patient Requires Transmission-Based No Precautions: Patient Has Alerts: No Electronic Signature(s) Signed: 12/03/2016 4:26:24 PM By: Gretta Cool, RN, BSN, Kim RN, BSN Entered By: Gretta Cool, RN, BSN, Kim on 12/03/2016 15:01:29 [name] ([id number]) -------------------------------------------------------------------------------- Encounter Discharge Information Details Patient Name: [name] Date of Service: 12/03/2016 2:45 PM Medical Record Number: [id number] Patient Account Number: [id number] Date of Birth/Sex: [birth year] (61 y.o. Male) Treating RN: Cornell Barman Primary Care Alexander Mcauley: [name] Other Clinician: Referring Add Dinapoli: [name] Treating Bonny Egger/Extender: [name] in Treatment: 34 Encounter Discharge Information Items Schedule Follow-up Appointment: No Medication Reconciliation completed No and provided to Patient/Care Breandan People: Provided on Clinical Summary of Care: 12/03/2016 Form  Type Recipient Paper Patient DG Electronic Signature(s) Signed: 12/03/2016 3:29:12 PM By: [name] Entered By: [name] on 12/03/2016 15:29:12 [place], [name] ([id number]) -------------------------------------------------------------------------------- Lower Extremity Assessment Details Patient Name: [name] Date of Service: 12/03/2016 2:45 PM Medical Record Number: [id number] Patient Account Number: [id number] Date of Birth/Sex: Jun 03, 1956 (61 y.o. Male) Treating RN: Cornell Barman Primary Care Jonas Goh: [name] Other Clinician: Referring Merin Borjon: [name] Treating Orchid Glassberg/Extender: [name] in Treatment: 34 Vascular Assessment Pulses: Dorsalis Pedis Palpable: [Left:Yes] [Right:Yes] Posterior Tibial Extremity colors, hair growth, and conditions: Extremity Color: [Left:Hyperpigmented] [Right:Hyperpigmented] Hair Growth on Extremity: [Right:No] Temperature of Extremity: [Left:Cool] [Right:Cool] Capillary Refill: [Left:< 3 seconds] [Right:< 3 seconds] Dependent Rubor: [Left:No] [Right:No] Blanched when Elevated: [Left:No] [Right:No] Lipodermatosclerosis: [Left:No] [Right:No] Toe Nail Assessment Left: Right: Thick: Yes Yes Discolored: Yes Yes Deformed: Yes Yes Improper Length and Hygiene: Yes Yes Electronic Signature(s) Signed: 12/03/2016 4:26:24 PM By: Gretta Cool, RN, BSN, Kim RN, BSN Entered By: Gretta Cool, RN, BSN, Kim on 12/03/2016 15:22:53 [name] ([id number]) -------------------------------------------------------------------------------- Multi Wound Chart Details Patient Name: [name] Date of Service: 12/03/2016 2:45 PM Medical Record Number: [id number] Patient Account Number: [id number] Date of Birth/Sex: 01-15-1956 (60 y.o. Male) Treating RN: Cornell Barman Primary Care Adrean Findlay: [name] Other Clinician: Referring Issac Moure: [name] Treating Tayler Heiden/Extender: [name] in Treatment: 34 Vital  Signs Height(in): 70 Pulse(bpm): 60 Weight(lbs): Blood Pressure 120/63 (mmHg): Body Mass Index(BMI): Temperature(F): Respiratory Rate 16 (breaths/min): Photos: [N/A:N/A] Wound Location: Left Calcaneus N/A N/A Wounding Event: Pressure Injury N/A N/A Primary Etiology: Arterial Insufficiency Ulcer N/A N/A Secondary Etiology: Pressure Ulcer N/A N/A Comorbid History: Anemia N/A N/A Date Acquired: 02/07/2016 N/A N/A Weeks of Treatment: 34 N/A N/A Wound Status: Open N/A N/A Measurements L x W x D 1x2x0.1 N/A N/A (cm) Area (cm) : 1.571 N/A N/A Volume (cm) : 0.157 N/A N/A % Reduction in Area: 92.60% N/A N/A % Reduction in Volume: 92.60% N/A N/A Classification: Full Thickness Without N/A N/A Exposed Support Structures Exudate Amount: None Present N/A N/A Wound Margin:  Flat and Intact N/A N/A Granulation Amount: None Present (0%) N/A N/A Necrotic Amount: Large (67-100%) N/A N/A Necrotic Tissue: Eschar N/A N/A Exposed Structures: Fat Layer (Subcutaneous N/A N/A Tissue) Exposed: Yes Rape, Ric ([id number]) Fascia: No Tendon: No Muscle: No Joint: No Bone: No Epithelialization: None N/A N/A Debridement: Debridement [id number] N/A N/A [id number]) Pre-procedure 03:20 N/A N/A Verification/Time Out Taken: Pain Control: Other N/A N/A Tissue Debrided: Fibrin/Slough, Exudates, N/A N/A Subcutaneous Level: Skin/Subcutaneous N/A N/A Tissue Debridement Area (sq 2 N/A N/A cm): Instrument: Curette N/A N/A Bleeding: Moderate N/A N/A Hemostasis Achieved: Pressure N/A N/A Procedural Pain: 0 N/A N/A Post Procedural Pain: 0 N/A N/A Debridement Treatment Procedure was tolerated N/A N/A Response: well Post Debridement 1x2x0.1 N/A N/A Measurements L x W x D (cm) Post Debridement 0.157 N/A N/A Volume: (cm) Periwound Skin Texture: Excoriation: No N/A N/A Induration: No Callus: No Crepitus: No Rash: No Scarring: No Periwound Skin Dry/Scaly: Yes N/A N/A Moisture: Maceration:  No Periwound Skin Color: Atrophie Blanche: No N/A N/A Cyanosis: No Ecchymosis: No Erythema: No Hemosiderin Staining: No Mottled: No Pallor: No Rubor: No Temperature: No Abnormality N/A N/A Tenderness on Yes N/A N/A Palpation: Wound Preparation: N/A N/A [place]qu[place], [name] ([id number]) Ulcer Cleansing: Rinsed/Irrigated with Saline Topical Anesthetic Applied: Other: lidocaine 4% Procedures Performed: Debridement N/A N/A Treatment Notes Electronic Signature(s) Signed: 12/03/2016 6:11:14 PM By: Linton Ham MD Previous Signature: 12/03/2016 4:26:24 PM Version By: Gretta Cool, RN, BSN, Kim RN, BSN Entered By: Linton Ham on 12/03/2016 16:56:10 Day, [name] ([id number]) -------------------------------------------------------------------------------- Multi-Disciplinary Care Plan Details Patient Name: [name] Date of Service: 12/03/2016 2:45 PM Medical Record Number: [id number] Patient Account Number: [id number] Date of Birth/Sex: January 27, 1956 (61 y.o. Male) Treating RN: Cornell Barman Primary Care Troi Bechtold: [name] Other Clinician: Referring Lyle Niblett: [name] Treating Tamia Dial/Extender: [name] in Treatment: 34 Active Inactive ` Abuse / Safety / Falls / Self Care Management Nursing Diagnoses: Potential for falls Goals: Patient will remain injury free Date Initiated: 04/08/2016 Target Resolution Date: 12/08/2016 Goal Status: Active Interventions: Assess fall risk on admission and as needed Notes: ` Nutrition Nursing Diagnoses: Imbalanced nutrition Goals: Patient/caregiver agrees to and verbalizes understanding of need to use nutritional supplements and/or vitamins as prescribed Date Initiated: 04/08/2016 Target Resolution Date: 12/10/2016 Goal Status: Active Interventions: Assess patient nutrition upon admission and as needed per policy Notes: ` Orientation to the Wound Care Program Nursing Diagnoses: Knowledge deficit related to the wound  healing center program THOMS, DERUITER ([id number]) Goals: Patient/caregiver will verbalize understanding of the [place] Date Initiated: 04/08/2016 Target Resolution Date: 12/10/2016 Goal Status: Active Interventions: Provide education on orientation to the wound center Notes: ` Pain, Acute or Chronic Nursing Diagnoses: Pain, acute or chronic: actual or potential Potential alteration in comfort, pain Goals: Patient will verbalize adequate pain control and receive pain control interventions during procedures as needed Date Initiated: 04/08/2016 Target Resolution Date: 12/10/2016 Goal Status: Active Interventions: Assess comfort goal upon admission Complete pain assessment as per visit requirements Notes: ` Pressure Nursing Diagnoses: Knowledge deficit related to causes and risk factors for pressure ulcer development Knowledge deficit related to management of pressures ulcers Goals: Patient will remain free from development of additional pressure ulcers Date Initiated: 04/08/2016 Target Resolution Date: 12/10/2016 Goal Status: Active Interventions: Assess offloading mechanisms upon admission and as needed Assess potential for pressure ulcer upon admission and as needed Notes: Goku Keleman, Derelle ([id number]) Wound/Skin Impairment Nursing Diagnoses: Impaired tissue integrity Goals: Ulcer/skin breakdown will have a volume reduction of 30% by week  4 Date Initiated: 04/08/2016 Target Resolution Date: 12/10/2016 Goal Status: Active Ulcer/skin breakdown will have a volume reduction of 50% by week 8 Date Initiated: 04/08/2016 Target Resolution Date: 12/10/2016 Goal Status: Active Ulcer/skin breakdown will have a volume reduction of 80% by week 12 Date Initiated: 04/08/2016 Target Resolution Date: 12/10/2016 Goal Status: Active Interventions: Assess ulceration(s) every visit Notes: Electronic Signature(s) Signed: 12/03/2016 4:26:24 PM By: Gretta Cool, RN, BSN, Kim RN,  BSN Entered By: Gretta Cool, RN, BSN, Kim on 12/03/2016 15:23:44 Ferencz, [name] ([id number]) -------------------------------------------------------------------------------- Pain Assessment Details Patient Name: [name] Date of Service: 12/03/2016 2:45 PM Medical Record Number: [id number] Patient Account Number: [id number] Date of Birth/Sex: 04-23-1956 (61 y.o. Male) Treating RN: Cornell Barman Primary Care Deedra Pro: [name] Other Clinician: Referring Onisha Cedeno: [name] Treating Amonie Wisser/Extender: [name] in Treatment: 34 Active Problems Location of Pain Severity and Description of Pain Patient Has Paino No Site Locations With Dressing Change: No Pain Management and Medication Current Pain Management: Notes Topical or injectable lidocaine is offered to patient for acute pain when surgical debridement is performed. If needed, Patient is instructed to use over the counter pain medication for the following 24-48 hours after debridement. Wound care MDs do not prescribed pain medications. Patient has chronic pain or uncontrolled pain. Patient has been instructed to make an appointment with their Primary Care Physician for pain management. Electronic Signature(s) Signed: 12/03/2016 4:26:24 PM By: Gretta Cool, RN, BSN, Kim RN, BSN Entered By: Gretta Cool, RN, BSN, Kim on 12/03/2016 15:01:47 [place]s, [name] ([id number]) -------------------------------------------------------------------------------- Wound Assessment Details Patient Name: [name] Date of Service: 12/03/2016 2:45 PM Medical Record Number: [id number] Patient Account Number: [id number] Date of Birth/Sex: [birth year] (61 y.o. Male) Treating RN: Cornell Barman Primary Care Brylei Pedley: [name] Other Clinician: Referring Sinthia Karabin: [name] Treating Catharina Pica/Extender: [name] in Treatment: 34 Wound Status Wound Number: 3 Primary Etiology: Arterial Insufficiency Ulcer Wound Location: Left  Calcaneus Secondary Etiology: Pressure Ulcer Wounding Event: Pressure Injury Wound Status: Open Date Acquired: 02/07/2016 Comorbid History: Anemia Weeks Of Treatment: 34 Clustered Wound: No Photos Photo Uploaded By: Gretta Cool, RN, BSN, Kim on 12/03/2016 16:05:11 Wound Measurements Length: (cm) 1 Width: (cm) 2 Depth: (cm) 0.1 Area: (cm) 1.571 Volume: (cm) 0.157 % Reduction in Area: 92.6% % Reduction in Volume: 92.6% Epithelialization: None Wound Description Full Thickness Without Exposed Foul Odor Afte Classification: Support Structures Slough/Fibrino Wound Margin: Flat and Intact Exudate None Present Amount: r Cleansing: No No Wound Bed Granulation Amount: None Present (0%) Exposed Structure Necrotic Amount: Large (67-100%) Fascia Exposed: No Necrotic Quality: Eschar Fat Layer (Subcutaneous Tissue) Exposed: Yes Tendon Exposed: No Muscle Exposed: No Joint Exposed: No Bone Exposed: No Marcello, Teagon ([id number]) Periwound Skin Texture Texture Color No Abnormalities Noted: No No Abnormalities Noted: No Callus: No Atrophie Blanche: No Crepitus: No Cyanosis: No Excoriation: No Ecchymosis: No Induration: No Erythema: No Rash: No Hemosiderin Staining: No Scarring: No Mottled: No Pallor: No Moisture Rubor: No No Abnormalities Noted: No Dry / Scaly: Yes Temperature / Pain Maceration: No Temperature: No Abnormality Tenderness on Palpation: Yes Wound Preparation Ulcer Cleansing: Rinsed/Irrigated with Saline Topical Anesthetic Applied: Other: lidocaine 4%, Electronic Signature(s) Signed: 12/03/2016 4:26:24 PM By: Gretta Cool, RN, BSN, Kim RN, BSN Entered By: Gretta Cool, RN, BSN, Kim on 12/03/2016 15:06:34 [name] ([id number]) -------------------------------------------------------------------------------- Vitals Details Patient Name: [name] Date of Service: 12/03/2016 2:45 PM Medical Record Number: [id number] Patient Account Number: [id number] Date of  Birth/Sex: Apr 13, 1956 (61 y.o. Male) Treating RN: Cornell Barman Primary Care Aneesa Romey: [name] Other Clinician: Referring  Nicholas Ossa: [name] Treating Jayleene Glaeser/Extender: [name] in Treatment: 34 Vital Signs Time Taken: 15:02 Pulse (bpm): 60 Height (in): 70 Respiratory Rate (breaths/min): 16 Blood Pressure (mmHg): 120/63 Reference Range: 80 - 120 mg / dl Electronic Signature(s) Signed: 12/03/2016 4:26:24 PM By: Gretta Cool, RN, BSN, Kim RN, BSN Entered By: Gretta Cool, RN, BSN, Kim on 12/03/2016 15:02:27

## 2016-12-04 NOTE — Progress Notes (Signed)
[name], [name] ([id number]) Visit Report for 12/03/2016 Chief Complaint Document Details Patient Name: [name] Date of Service: 12/03/2016 2:45 PM Medical Record Number: [id number] Patient Account Number: [id number] Date of Birth/Sex: Mar 11, 1956 (61 y.o. Male) Treating RN: Cornell Barman Primary Care Provider: [name] Other Clinician: Referring Provider: [name] Treating Provider/Extender: [name] in Treatment: 34 Information Obtained from: Patient Chief Complaint here for follow up on bilateral heel pressure ulcers Electronic Signature(s) Signed: 12/03/2016 6:11:14 PM By: Linton Ham MD Entered By: Linton Ham on 12/03/2016 16:56:22 Gainey, [name] ([id number]) -------------------------------------------------------------------------------- Debridement Details Patient Name: [name] Date of Service: 12/03/2016 2:45 PM Medical Record Number: [id number] Patient Account Number: [id number] Date of Birth/Sex: 02-15-1956 (61 y.o. Male) Treating RN: Cornell Barman Primary Care Provider: [name] Other Clinician: Referring Provider: [name] Treating Provider/Extender: [name] in Treatment: 34 Debridement Performed for Wound #3 Left Calcaneus Assessment: Performed By: Physician [name], MD Debridement: Debridement Pre-procedure Yes - 03:20 Verification/Time Out Taken: Start Time: 03:21 Pain Control: Other : lidocane 4% Level: Skin/Subcutaneous Tissue Total Area Debrided (L x 1 (cm) x 2 (cm) = 2 (cm) W): Tissue and other Viable, Non-Viable, Exudate, Fibrin/Slough, Subcutaneous material debrided: Instrument: Curette Bleeding: Moderate Hemostasis Achieved: Pressure End Time: 03:25 Procedural Pain: 0 Post Procedural Pain: 0 Response to Treatment: Procedure was tolerated well Post Debridement Measurements of Total Wound Length: (cm) 1 Width: (cm) 2 Depth: (cm) 0.1 Volume: (cm) 0.157 Character of  Wound/Ulcer Post Requires Further Debridement Debridement: Severity of Tissue Post Debridement: Limited to breakdown of skin Post Procedure Diagnosis Same as Pre-procedure Electronic Signature(s) Signed: 12/03/2016 4:26:24 PM By: Gretta Cool, RN, BSN, Kim RN, BSN Signed: 12/03/2016 6:11:14 PM By: Linton Ham MD Entered By: Gretta Cool, RN, BSN, Kim on 12/03/2016 15:25:21 JEFFRAY, STONEY ([id number]) CLEVEN, KONDA ([id number]) -------------------------------------------------------------------------------- HPI Details Patient Name: [name] Date of Service: 12/03/2016 2:45 PM Medical Record Number: [id number] Patient Account Number: [id number] Date of Birth/Sex: 01-28-1956 (61 y.o. Male) Treating RN: Cornell Barman Primary Care Provider: [name] Other Clinician: Referring Provider: [name] Treating Provider/Extender: [name] in Treatment: 34 History of Present Illness HPI Description: 04/08/16; this is a patient we really don't know too much about. He was hospitalized from 3/24 through 4/6 residing with a sodium of 167 creatinine of 2.49 right lower lobe pneumonia sepsis/septic shock syndrome. He apparently came back to [facility] He apparently had been there only 2 weeks before he went out. I know very little about him premorbidly. The hospital he had a CT scan of the head that showed atrophy and chronic small vessel white matter ischemic changes and remote infarcts also noted within the bilateral basal ganglia. His echocardiogram and presentation showed a EF of 20-25%, left ventricle was moderately dilated with diffuse hypokinesis. Apparently he return to his assisted living with wounds on his bilateral feet. According to the attendant came with him he could walk before he went out he could no longer walk now. There are wounds on both heels covered with a black eschar and also wounds on his bilateral dorsal  feet. ABIs calculated in our clinic for 0.49 bilaterally. He was a smoker up to 2 months ago he is not smoking now. 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 Monophasic waves distally bilaterally.  He is going back at the end of the month I believe June 29 probably for an arteriogram as arranged by Dr. Delana Meyer in the meantime he has had no real changes 04/30/16; we are waiting vascular review on June 29. The black eschar over his bilateral heels is beginning to separate using Santyl. The areas on the dorsal aspect of his feet looked considerably better/resolved. He apparently has no complaints. He also has a severe presumably ischemic cardiomyopathy as noted above 05/21/16:pt returns today for ongoing evaluation of wounds. I reviewed recent venous and arterial diagnostic findings. He is scheduled for a LLE angio on 05/26/16 and RLE angio on 06/02/16 for advanced evaluation of PAD as noted per lower extremity arterial duplex. BLE venous duplex was negative for venous reflux, DVT or insufficiency. 06/10/16; this is a patient I have not seen in over a month although he was seen here on 7/12. When I admitted him to our clinic I felt he had pressure ulcerations but also potential for limb threatening ischemia. I referred him urgently to vascular surgery. Since we have last seen him in this clinic he has undergone 2 procedures. On 7/17 he will underwent percutaneous transluminal angioplasty of the left above-knee popliteal artery as well as the mid to distal SFA. On 7/27 he underwent for cutaneous transluminal angioplasty of the right popliteal artery and SFA an angioplasty of the proximal SFA. He had a stent placement in the right popliteal artery. The patient is difficult to gauge symptomatology although he does not currently complain of  pain. Our intake nurse reports purulent drainage out of the left heel and an odor. 06/17/2016 -- right heel x-ray and left heel x-ray -- IMPRESSION:No radiographic evidence of osteomyelitis. Soft tissue changes are consistent with cellulitis. He had a wound culture which is positive for abundant Citrobacter fundi and pseudomonas aeruginosa. Degraaf, Mykai ([id number]) Both are sensitive to ciprofloxacin and at the present time the patient is on doxycycline 100 mg, prescribed by Dr. Dellia Nims last week. I will prescribe ciprofloxacin for him today. 07/16/16 it is been quite a while since I've seen these wounds however both of them are quite a bit better. Using [place], being followed by [facility]. 07/30/16; I see this man every 2 weeks. He had ischemic wounds on his bilateral heels. He has been using Santyl with [facility] coming out to his facility 08/12/16 Patient comes in today for a follow-up visit we see him every 2 weeks at this point in time. He has bilateral heel wounds the we have been applying Santyl to and he has home health coming out to this facility for these dressing changes. These wounds seem to be improving dramatically since he had the procedures with vascular to stent and improve his blood flow in the bilateral lower extremities. Currently he has been responding well to debridement with Dr. Dellia Nims as well. He tells me at this point he really has no sniffing discomfort or pain at this time. He is tolerating the dressing changes well at this point as well. 08/26/16; patient comes in for his two-week follow-up. He has bilateral heel pressure ulcers that were complicated by severe ischemia now corrected. Has been using Santyl to these areas. 09/17/2016 - Mr. Zaldivar arrives today with an assistance from the facility that he resides at. Mr. Carpenito offers no complaints, answering most questions "no" or "okay"; most information obtained from the assistant. Heel protectors are  now only being worn at night, not throughout the day and the  patient has a history of resting heels directly on the ground throughout the day versus on the foot rests of the wheelchair (R>L). Home health continues to provide dressing changes. The caregiver/attendant was encouraged to bring in heel protectors to next weeks visit for evaluation in addition, the patient and the attendant were encouraged to wear to heel protector at all times. Mr Maples remains non-ambulatory 10/08/16; patient arrives today in 3 week follow-up. When he was last seen here our staff and providers felt that he was well on his way to healing. He has home health changing the dressings. He arrives today with an odor coming out of the left heel that smelt like "rotten potatoes" according to our intake nurse. He is not been systemically unwell. The individual who accompanies him from the facility states that he uses his body boots at night but sits in the wheelchair rubbing his heal up and down on the dorsal aspect of the other foot. 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 Now status post revascularization. He had been doing quite nicely however last week presented with a very significant deterioration in the bilateral ulcers. Especially the area on the left had an older. CandS showed abundant Pseudomonas and Proteus. I empirically put him on ciprofloxacin although the Proteus was only intermediate in terms of its sensitivity. 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 Now status post revascularization he had been doing quite well and most of [place] felt these were going to heal however he developed recurrent trauma caused by rubbing his heels against the dorsal aspect of the other foot he subsequently developed an infection  with Proteus and Pseudomonas. I gave him Cipro however the protein was was only intermediately sensitive. I therefore have him on a 10 day course of Cefdinir 10/29/16; follow-up of bilateral heel ulcers which were initially a combination of pressure and severe PAD. He is now status post revascularization and has been doing quite well. In fact the right heel is healed. He has a continued problem with the left heel. He should be just about completing antibiotics. 11/19/16; follow-up bilateral heel ulcers which were initially a combination of pressure and severe PAD. He is now status post revascularization on the right heel is actually closed and we had close this over one to 2 weeks ago. Following up on the left heel today 12/03/16; the right heel remains covered and I'm not exactly sure if this surface is completely viable however no debridement is done today. The left heel still has a small but deep open area which is only observed after removal of a considerable amount of eschar as usual. Culture from last week of the purulent drainage of the left heel grew MRSA and Proteus. I gave him a combination of Cefdinir and doxycycline. AKSHAT, LITER ([id number]) Electronic Signature(s) Signed: 12/03/2016 6:11:14 PM By: Linton Ham MD Entered By: Linton Ham on 12/03/2016 17:01:28 [name] ([id number]) -------------------------------------------------------------------------------- Physical Exam Details Patient Name: [name] Date of Service: 12/03/2016 2:45 PM Medical Record Number: [id number] Patient Account Number: [id number] Date of Birth/Sex: 07/02/1956 (61 y.o. Male) Treating RN: Cornell Barman Primary Care Provider: [name] Other Clinician: Referring Provider: [name] Treating Provider/Extender: [name] in Treatment: 34 Constitutional Sitting or standing Blood Pressure is within target range for patient.. Pulse regular and  within target range for  patient.Marland Kitchen Respirations regular, non-labored and within target range.. Temperature is normal and within the target range for the patient.. Patient's appearance is neat and clean. Appears in no acute distress. Well nourished and well developed.. Notes Wound exam; once again I have to take a #3 curet to the surface eschar and nonviable subcutaneous tissue on the tip of the left heel. No purulent drainage was noted. The patient is on antibiotics Electronic Signature(s) Signed: 12/03/2016 6:11:14 PM By: Linton Ham MD Entered By: Linton Ham on 12/03/2016 16:59:32 [name] ([id number]) -------------------------------------------------------------------------------- Physician Orders Details Patient Name: [name] Date of Service: 12/03/2016 2:45 PM Medical Record Number: [id number] Patient Account Number: [id number] Date of Birth/Sex: December 19, 1955 (61 y.o. Male) Treating RN: Cornell Barman Primary Care Provider: [name] Other Clinician: Referring Provider: [name] Treating Provider/Extender: [name] in Treatment: 34 Verbal / Phone Orders: No Diagnosis Coding Wound Cleansing Wound #3 Left Calcaneus o Clean wound with Normal Saline. o Cleanse wound with mild soap and water - HHRN please was feet and legs with mild soap and water and dry. Anesthetic Wound #3 Left Calcaneus o Topical Lidocaine 4% cream applied to wound bed prior to debridement - office use only Skin Barriers/Peri-Wound Care Wound #3 Left Calcaneus o Moisturizing lotion - Please put lotion on pts legs and feet***do not put on wound or between toes*** Primary Wound Dressing Wound #3 Left Calcaneus o Aquacel Ag o Other: - foam Secondary Dressing Wound #3 Left Calcaneus o Dry Gauze o Conform/Kerlix - tape, netting o Foam - heel cups to heels Dressing Change Frequency Wound #3 Left Calcaneus o Change dressing every other day. Follow-up Appointments Wound #3  Left Calcaneus o Return Appointment in 1 week. Off-Loading Wound #3 Left Calcaneus Goupil, Nikola ([id number]) o Heel suspension boot to: - pt to wear sage boots, please float heels o Turn and reposition every 2 hours o Other: - Please use foam to protect the top of both feet Additional Orders / Instructions Wound #3 Left Calcaneus o Increase protein intake. Home Health Wound #3 Left Calcaneus o Continue Home Health Visits - [place] Nurse may visit PRN to address patientos wound care needs. o FACE TO FACE ENCOUNTER: MEDICARE and MEDICAID PATIENTS: I certify that this patient is under my care and that I had a face-to-face encounter that meets the physician face-to-face encounter requirements with this patient on this date. The encounter with the patient was in whole or in part for the following MEDICAL CONDITION: (primary reason for [place]) MEDICAL NECESSITY: I certify, that based on my findings, NURSING services are a medically necessary home health service. HOME BOUND STATUS: I certify that my clinical findings support that this patient is homebound (i.e., Due to illness or injury, pt requires aid of supportive devices such as crutches, cane, wheelchairs, walkers, the use of special transportation or the assistance of another person to leave their place of residence. There is a normal inability to leave the home and doing so requires considerable and taxing effort. Other absences are for medical reasons / religious services and are infrequent or of short duration when for other reasons). o If current dressing causes regression in wound condition, may D/C ordered dressing product/s and apply Normal Saline Moist Dressing daily until next Catherine / Other MD appointment. [place] of regression in wound condition at [phone]. o Please direct any NON-WOUND related issues/requests for orders to patient's  Primary Care  Physician Medications-please add to medication list. Wound #3 Left Calcaneus o P.O. Antibiotics - Cefinir o Other: - Vitamin C, Zinc, Multivitamin Electronic Signature(s) Signed: 12/03/2016 4:26:24 PM By: Gretta Cool RN, BSN, Kim RN, BSN Signed: 12/03/2016 6:11:14 PM By: Linton Ham MD Entered By: Gretta Cool RN, BSN, Kim on 12/03/2016 15:28:31 GLOVER, DIBERT ([id number]) -------------------------------------------------------------------------------- Prescription 12/03/2016 Patient Name: [name] Provider: [name] MD Date of Birth: [birth year] NPI#: [id number] Sex: Jerilynn Mages DEA#: [id number] Phone #: [phone] License #: [id number] Patient Address: [place], [place] [address] [facility] [address] [place], [place] [address] [phone] Allergies NKDA Provider's Orders P.O. Antibiotics - Cefinir Signature(s): Date(s): Engineer, maintenance) Signed: 12/03/2016 4:26:24 PM By: Gretta Cool, RN, BSN, Kim RN, BSN Signed: 12/03/2016 6:11:14 PM By: Linton Ham MD Entered By: Gretta Cool, RN, BSN, Kim on 12/03/2016 15:28:32 [name] ([id number]) --------------------------------------------------------------------------------  Problem List Details Patient Name: [name] Date of Service: 12/03/2016 2:45 PM Medical Record Number: [id number] Patient Account Number: [id number] Date of Birth/Sex: 1955/11/13 (61 y.o. 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.245 Atherosclerosis of native arteries of left leg with ulceration 04/08/2016 Yes of other part of foot Inactive Problems Resolved Problems ICD-10 Code Description Active Date Resolved Date L89.610 Pressure ulcer of right heel, unstageable 04/08/2016 04/08/2016 L89.620 Pressure ulcer of left heel, unstageable 04/08/2016 04/08/2016 Electronic Signature(s) Signed: 12/03/2016 6:11:14 PM By: Linton Ham MD Entered By: Linton Ham on 12/03/2016 16:55:56 [place], [place] ([id number]) -------------------------------------------------------------------------------- Progress Note Details Patient Name: [name] Date of Service: 12/03/2016 2:45 PM Medical Record Number: [id number] Patient Account Number: [id number] Date of Birth/Sex: [birth year] (61 y.o. Male) Treating RN: Cornell Barman Primary Care Provider: [name] Other Clinician: Referring Provider: [name] Treating Provider/Extender: [name] in Treatment: 34 Subjective Chief Complaint Information obtained from Patient here for follow up on bilateral heel pressure ulcers History of Present Illness (HPI) 04/08/16; this is a patient we really don't know too much about. He was hospitalized from 3/24 through 4/6 residing with a sodium of 167 creatinine of 2.49 right lower lobe pneumonia sepsis/septic shock syndrome. He apparently came back to the [facility] He apparently had been there only 2 weeks before he went out. I know very little about him premorbidly. The hospital he had a CT scan of the head that showed atrophy and chronic small vessel white matter ischemic changes and remote infarcts also noted within the bilateral basal ganglia. His echocardiogram and presentation showed a EF of 20-25%, left ventricle was moderately dilated with diffuse hypokinesis. Apparently he return to his assisted living with wounds on his bilateral  feet. According to the attendant came with him he could walk before he went out he could no longer walk now. There are wounds on both heels covered with a black eschar and also wounds on his bilateral dorsal feet. ABIs calculated in our clinic for 0.49 bilaterally. He was a smoker up to 2 months ago he is not smoking now. 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 Monophasic waves distally bilaterally. He is going back at the end of the month I believe June 29 probably for an arteriogram as arranged by Dr. Delana Meyer in the meantime he has had no real changes 04/30/16; we are waiting vascular review on June 29. The black eschar over his bilateral heels is beginning to separate using Santyl. The areas on the dorsal aspect of his feet looked considerably better/resolved. He apparently has no complaints. He also has a severe presumably ischemic cardiomyopathy as noted above 05/21/16:pt returns today for ongoing evaluation of wounds. I reviewed recent venous and arterial diagnostic findings. He is scheduled for a LLE angio on 05/26/16 and RLE angio on 06/02/16 for advanced evaluation of PAD as noted per lower extremity arterial duplex. BLE venous duplex was negative for venous reflux, DVT or insufficiency. 06/10/16; this is a patient I have not seen in over a month although he was seen here on 7/12. When I admitted him to our clinic I felt he had pressure ulcerations but also potential for limb threatening ischemia. I referred him urgently to vascular surgery. Since we have last seen him in this clinic he has undergone 2 procedures. On 7/17 he will underwent percutaneous transluminal angioplasty of the left above-knee popliteal artery as well as the mid to distal SFA. On 7/27 he underwent for cutaneous transluminal angioplasty of the right  popliteal artery and SFA an angioplasty of the proximal SFA. He had a stent placement in the right popliteal artery. The patient is difficult to gauge symptomatology although he does not currently complain of pain. Our intake nurse reports purulent drainage out of the left heel and an odor. IBAN, SOBEL ([id number]) 06/17/2016 -- right heel x-ray and left heel x-ray -- IMPRESSION:No radiographic evidence of osteomyelitis. Soft tissue changes are consistent with cellulitis. He had a wound culture which is positive for abundant Citrobacter fundi and pseudomonas aeruginosa. Both are sensitive to ciprofloxacin and at the present time the patient is on doxycycline 100 mg, prescribed by Dr. Dellia Nims last week. I will prescribe ciprofloxacin for him today. 07/16/16 it is been quite a while since I've seen these wounds however both of them are quite a bit better. Using [place], being followed by [facility]. 07/30/16; I see this man every 2 weeks. He had ischemic wounds on his bilateral heels. He has been using Santyl with [facility] coming out to his facility 08/12/16 Patient comes in today for a follow-up visit we see him every 2 weeks at this point in time. He has bilateral heel wounds the we have been applying Santyl to and he has home health coming out to this facility for these dressing changes. These wounds seem to be improving dramatically since he had the procedures with vascular to stent and improve his blood flow in the bilateral lower extremities. Currently he has been responding well to debridement with Dr. Dellia Nims as well. He tells me at this point he really has no sniffing discomfort or pain at this time. He is tolerating the dressing changes well at this point as well. 08/26/16; patient comes in for his two-week follow-up. He has bilateral heel pressure ulcers that were complicated by severe ischemia now corrected. Has been using Santyl to these areas. 09/17/2016 - Mr. Chea  arrives today with an assistance from the facility that he resides at. Mr. Zakowski offers no complaints, answering  most questions "no" or "okay"; most information obtained from the assistant. Heel protectors are now only being worn at night, not throughout the day and the patient has a history of resting heels directly on the ground throughout the day versus on the foot rests of the wheelchair (R>L). Home health continues to provide dressing changes. The caregiver/attendant was encouraged to bring in heel protectors to next weeks visit for evaluation in addition, the patient and the attendant were encouraged to wear to heel protector at all times. Mr Leo remains non-ambulatory 10/08/16; patient arrives today in 3 week follow-up. When he was last seen here our staff and providers felt that he was well on his way to healing. He has home health changing the dressings. He arrives today with an odor coming out of the left heel that smelt like "rotten potatoes" according to our intake nurse. He is not been systemically unwell. The individual who accompanies him from the facility states that he uses his body boots at night but sits in the wheelchair rubbing his heal up and down on the dorsal aspect of the other foot. 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 Now status post revascularization. He had been doing quite nicely however last week presented with a very significant deterioration in the bilateral ulcers. Especially the area on the left had an older. CandS showed abundant Pseudomonas and Proteus. I empirically put him on ciprofloxacin although the Proteus was only intermediate in terms of its sensitivity. 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 Now status post revascularization he had been  doing quite well and most of [place] felt these were going to heal however he developed recurrent trauma caused by rubbing his heels against the dorsal aspect of the other foot he subsequently developed an infection with Proteus and Pseudomonas. I gave him Cipro however the protein was was only intermediately sensitive. I therefore have him on a 10 day course of Cefdinir 10/29/16; follow-up of bilateral heel ulcers which were initially a combination of pressure and severe PAD. He is now status post revascularization and has been doing quite well. In fact the right heel is healed. He has a continued problem with the left heel. He should be just about completing antibiotics. 11/19/16; follow-up bilateral heel ulcers which were initially a combination of pressure and severe PAD. He is now status post revascularization on the right heel is actually closed and we had close this over one to 2 weeks ago. Following up on the left heel today Whobrey, Octavia ([id number]) 12/03/16; the right heel remains covered and I'm not exactly sure if this surface is completely viable however no debridement is done today. The left heel still has a small but deep open area which is only observed after removal of a considerable amount of eschar as usual. Culture from last week of the purulent drainage of the left heel grew MRSA and Proteus. I gave him a combination of Cefdinir and doxycycline. Objective Constitutional Sitting or standing Blood Pressure is within target range for patient.. Pulse regular and within target range for patient.Marland Kitchen Respirations regular, non-labored and within target range.. Temperature is normal and within the target range for the patient.. Patient's appearance is neat and clean. Appears in no acute distress. Well nourished and well developed.. Vitals Time Taken: 3:02 PM, Height: 70  in, Pulse: 60 bpm, Respiratory Rate: 16 breaths/min, Blood Pressure: 120/63 mmHg. General Notes: Wound exam; once again I  have to take a #3 curet to the surface eschar and nonviable subcutaneous tissue on the tip of the left heel. No purulent drainage was noted. The patient is on antibiotics Integumentary (Hair, Skin) Wound #3 status is Open. Original cause of wound was Pressure Injury. The wound is located on the Left Calcaneus. The wound measures 1cm length x 2cm width x 0.1cm depth; 1.571cm^2 area and 0.157cm^3 volume. There is Fat Layer (Subcutaneous Tissue) Exposed exposed. There is a none present amount of drainage noted. The wound margin is flat and intact. There is no granulation within the wound bed. There is a large (67-100%) amount of necrotic tissue within the wound bed including Eschar. The periwound skin appearance exhibited: Dry/Scaly. The periwound skin appearance did not exhibit: Callus, Crepitus, Excoriation, Induration, Rash, Scarring, Maceration, Atrophie Blanche, Cyanosis, Ecchymosis, Hemosiderin Staining, Mottled, Pallor, Rubor, Erythema. Periwound temperature was noted as No Abnormality. The periwound has tenderness on palpation. 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 There was a Skin/Subcutaneous Tissue Debridement [id number]) debridement with total area of 2 sq cm performed by [name], MD. with the following instrument(s): Curette to remove Viable and Non-Viable tissue/material including Exudate, Fibrin/Slough, and Subcutaneous after achieving pain control using Other (lidocane 4%). A time out was conducted at 03:20, prior to the start of the procedure. A Moderate amount of  bleeding was controlled with Pressure. The procedure was tolerated well with a pain level of 0 throughout and a pain level of 0 following the procedure. Post Debridement Measurements: 1cm length x 2cm width x 0.1cm depth; 0.157cm^3 volume. Character of Wound/Ulcer Post Debridement requires further debridement. Severity of Tissue Post Debridement is: Limited to breakdown of skin. Post procedure Diagnosis Wound #3: Same as Pre-Procedure Plan Wound Cleansing: Wound #3 Left Calcaneus: Clean wound with Normal Saline. Cleanse wound with mild soap and water - HHRN please was feet and legs with mild soap and water and dry. Anesthetic: Wound #3 Left Calcaneus: Topical Lidocaine 4% cream applied to wound bed prior to debridement - office use only Skin Barriers/Peri-Wound Care: Wound #3 Left Calcaneus: Moisturizing lotion - Please put lotion on pts legs and feet***do not put on wound or between toes*** Primary Wound Dressing: Wound #3 Left Calcaneus: Aquacel Ag Other: - foam Secondary Dressing: Wound #3 Left Calcaneus: Dry Gauze Conform/Kerlix - tape, netting Foam - heel cups to heels Dressing Change Frequency: Wound #3 Left Calcaneus: Mohon, Demichael ([id number]) Change dressing every other day. Follow-up Appointments: Wound #3 Left Calcaneus: Return Appointment in 1 week. Off-Loading: Wound #3 Left Calcaneus: Heel suspension boot to: - pt to wear sage boots, please float heels Turn and reposition every 2 hours Other: - Please use foam to protect the top of both feet Additional Orders / Instructions: Wound #3 Left Calcaneus: Increase protein intake. Home Health: Wound #3 Left Calcaneus: Continue Home Health Visits - [facility] Nurse may visit PR[facility] to address patient s wound care needs. FACE TO FACE ENCOUNTER: MEDICARE and MEDICAID PATIENTS: I certify  that this patient is under my care and that I had a face-to-face encounter that meets the physician face-to-face  encounter requirements with this patient on this date. The encounter with the patient was in whole or in part for the following MEDICAL CONDITION: (primary reason for [place]) MEDICAL NECESSITY: I certify, that based on my findings, NURSING services are a medically necessary home health service. HOME BOUND STATUS: I certify that my clinical findings support that this patient is homebound (i.e., Due to illness or injury, pt requires aid of supportive devices such as crutches, cane, wheelchairs, walkers, the use of special transportation or the assistance of another person to leave their place of residence. There is a normal inability to leave the home and doing so requires considerable and taxing effort. Other absences are for medical reasons / religious services and are infrequent or of short duration when for other reasons). If current dressing causes regression in wound condition, may D/C ordered dressing product/s and apply Normal Saline Moist Dressing daily until next [place] / Other MD appointment. [place] of regression in wound condition at [phone]. Please direct any NON-WOUND related issues/requests for orders to patient's Primary Care Physician Medications-please add to medication list.: Wound #3 Left Calcaneus: P.O. Antibiotics - Cefinir Other: - Vitamin C, Zinc, Multivitamin We continued with the Aquacel Ag covered with border foam to the left heel. His right heel remains closed and although the surface is and as visually viable is on might like I am not going to attempt to remove this It would be difficult to see him getting an x-ray or an MRI of the heel without significant sedation it is very difficult for me to examine him. He has been revascularized BRADSHAW, YANIK ([id number]) Electronic Signature(s) Signed: 12/03/2016 6:11:14 PM By: Linton Ham MD Entered By: Linton Ham on 12/03/2016 17:03:11 [name]  ([id number]) -------------------------------------------------------------------------------- SuperBill Details Patient Name: [name] Date of Service: 12/03/2016 Medical Record Number: [id number] Patient Account Number: [id number] Date of Birth/Sex: Mar 13, 1956 (61 y.o. 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

## 2016-12-23 ENCOUNTER — Encounter: Payer: Medicare Other | Attending: Internal Medicine | Admitting: Internal Medicine

## 2016-12-23 DIAGNOSIS — I70234 Atherosclerosis of native arteries of right leg with ulceration of heel and midfoot: Secondary | ICD-10-CM | POA: Insufficient documentation

## 2016-12-23 DIAGNOSIS — I70245 Atherosclerosis of native arteries of left leg with ulceration of other part of foot: Secondary | ICD-10-CM | POA: Insufficient documentation

## 2016-12-23 DIAGNOSIS — Z8673 Personal history of transient ischemic attack (TIA), and cerebral infarction without residual deficits: Secondary | ICD-10-CM | POA: Diagnosis not present

## 2016-12-23 DIAGNOSIS — Z87891 Personal history of nicotine dependence: Secondary | ICD-10-CM | POA: Diagnosis not present

## 2016-12-23 DIAGNOSIS — L89613 Pressure ulcer of right heel, stage 3: Secondary | ICD-10-CM | POA: Diagnosis not present

## 2016-12-23 DIAGNOSIS — L89623 Pressure ulcer of left heel, stage 3: Secondary | ICD-10-CM | POA: Diagnosis not present

## 2016-12-24 NOTE — Progress Notes (Signed)
[name], [name] ([id number]) Visit Report for 12/23/2016 Arrival Information Details Patient Name: [name] Date of Service: 12/23/2016 10:15 AM Medical Record Number: [id number] Patient Account Number: [id number] Date of Birth/Sex: 04/17/1956 (61 y.o. Male) Treating RN: [name] Primary Care [name]: [name] Other Clinician: Referring [name]: [name] Treating [name]/Extender: [name] in Treatment: 92 Visit Information History Since Last Visit Added or deleted any medications: No Patient Arrived: Wheel Chair Any new allergies or adverse No reactions: Arrival Time: 10:28 Had a fall or experienced change in No Accompanied By: staff activities of daily living that may Transfer Assistance: Manual affect Patient Identification Verified: Yes risk of falls: Secondary Verification Process Yes Signs or symptoms of abuse/neglect No Completed: since last visito Patient Requires Transmission-Based No Hospitalized since last visit: No Precautions: Has Dressing in Place as Yes Patient Has Alerts: No Prescribed: Pain Present Now: Unable to Respond Electronic Signature(s) Signed: 12/23/2016 4:56:13 PM By: [name] Entered By: [name] on 12/23/2016 10:29:10 [name] ([id number]) -------------------------------------------------------------------------------- Clinic Level of Care Assessment Details Patient Name: [name] Date of Service: 12/23/2016 10:15 AM Medical Record Number: [id number] Patient Account Number: [id number] Date of Birth/Sex: Feb 26, 1956 (61 y.o. Male) Treating RN: [name] Primary Care [name]: [name] Other Clinician: Referring [name]: [name] Treating [name]/Extender: [name] in Treatment: 37 Clinic Level of Care Assessment Items TOOL 4 Quantity Score []  - Use when only an EandM is performed on FOLLOW-UP visit 0 ASSESSMENTS - Nursing Assessment /  Reassessment X - Reassessment of Co-morbidities (includes updates in patient status) 1 10 X - Reassessment of Adherence to Treatment Plan 1 5 ASSESSMENTS - Wound and Skin Assessment / Reassessment X - Simple Wound Assessment / Reassessment - one wound 1 5 []  - Complex Wound Assessment / Reassessment - multiple wounds 0 []  - Dermatologic / Skin Assessment (not related to wound area) 0 ASSESSMENTS - Focused Assessment []  - Circumferential Edema Measurements - multi extremities 0 []  - Nutritional Assessment / Counseling / Intervention 0 X - Lower Extremity Assessment (monofilament, tuning fork, pulses) 1 5 []  - Peripheral Arterial Disease Assessment (using hand held doppler) 0 ASSESSMENTS - Ostomy and/or Continence Assessment and Care []  - Incontinence Assessment and Management 0 []  - Ostomy Care Assessment and Management (repouching, etc.) 0 PROCESS - Coordination of Care X - Simple Patient / Family Education for ongoing care 1 15 []  - Complex (extensive) Patient / Family Education for ongoing care 0 []  - Staff obtains Programmer, systems, Records, Test Results / Process Orders 0 []  - Staff telephones HHA, Nursing Homes / Clarify orders / etc 0 []  - Routine Transfer to another Facility (non-emergent condition) 0 [name] ([id number]) []  - Routine Hospital Admission (non-emergent condition) 0 []  - New Admissions / Biomedical engineer / Ordering NPWT, Apligraf, etc. 0 []  - Emergency Hospital Admission (emergent condition) 0 X - Simple Discharge Coordination 1 10 []  - Complex (extensive) Discharge Coordination 0 PROCESS - Special Needs []  - Pediatric / Minor Patient Management 0 []  - Isolation Patient Management 0 []  - Hearing / Language / Visual special needs 0 []  - Assessment of Community assistance (transportation, D/C planning, etc.) 0 []  - Additional assistance / Altered mentation 0 []  - Support Surface(s) Assessment (bed, cushion, seat, etc.) 0 INTERVENTIONS - Wound Cleansing /  Measurement X - Simple Wound Cleansing - one wound 1 5 []  - Complex Wound Cleansing - multiple wounds 0 X - Wound Imaging (photographs - any number of wounds) 1 5 []  -  Wound Tracing (instead of photographs) 0 X - Simple Wound Measurement - one wound 1 5 []  - Complex Wound Measurement - multiple wounds 0 INTERVENTIONS - Wound Dressings []  - Small Wound Dressing one or multiple wounds 0 []  - Medium Wound Dressing one or multiple wounds 0 []  - Large Wound Dressing one or multiple wounds 0 []  - Application of Medications - topical 0 []  - Application of Medications - injection 0 INTERVENTIONS - Miscellaneous []  - External ear exam 0 [name] ([id number]) []  - Specimen Collection (cultures, biopsies, blood, body fluids, etc.) 0 []  - Specimen(s) / Culture(s) sent or taken to Lab for analysis 0 []  - Patient Transfer (multiple staff / Harrel Lemon Lift / Similar devices) 0 []  - Simple Staple / Suture removal (25 or less) 0 []  - Complex Staple / Suture removal (26 or more) 0 []  - Hypo / Hyperglycemic Management (close monitor of Blood Glucose) 0 []  - Ankle / Brachial Index (ABI) - do not check if billed separately 0 X - Vital Signs 1 5 Has the patient been seen at the hospital within the last three years: Yes Total Score: 70 Level Of Care: New/Established - Level 2 Electronic Signature(s) Signed: 12/23/2016 4:56:13 PM By: [name] Entered By: [name] on 12/23/2016 11:27:34 [name] ([id number]) -------------------------------------------------------------------------------- Encounter Discharge Information Details Patient Name: [name] Date of Service: 12/23/2016 10:15 AM Medical Record Number: [id number] Patient Account Number: [id number] Date of Birth/Sex: [birth year] (61 y.o. Male) Treating RN: [name] Primary Care Louie Flenner: [name] Other Clinician: Referring Marcanthony Sleight: [name] Treating [name]/Extender: [name] in Treatment:  67 Encounter Discharge Information Items Discharge Pain Level: 0 Discharge Condition: Stable Ambulatory Status: Wheelchair Discharge Destination: Home Transportation: Private Auto Accompanied By: staff Schedule Follow-up Appointment: No Medication Reconciliation completed and provided to Patient/Care No Benjamin Casanas: Provided on Clinical Summary of Care: 12/23/2016 Form Type Recipient Paper Patient DG Electronic Signature(s) Signed: 12/23/2016 4:56:13 PM By: [name] Previous Signature: 12/23/2016 11:05:00 AM Version By: [name] Previous Signature: 12/23/2016 10:46:21 AM Version By: [name] Entered By: [name] on 12/23/2016 11:27:53 Boehle, Lorenso Hughes ([id number]) -------------------------------------------------------------------------------- Lower Extremity Assessment Details Patient Name: [name] Date of Service: 12/23/2016 10:15 AM Medical Record Number: [id number] Patient Account Number: [id number] Date of Birth/Sex: 1956-04-27 (61 y.o. Male) Treating RN: [name] Primary Care Mariana Wiederholt: [name] Other Clinician: Referring Dejha King: [name] Treating Orrie Lascano/Extender: [name] in Treatment: 37 Vascular Assessment Pulses: Dorsalis Pedis Palpable: [Left:Yes] Posterior Tibial Extremity colors, hair growth, and conditions: Extremity Color: [Left:Hyperpigmented] Hair Growth on Extremity: [Left:No] Temperature of Extremity: [Left:Cool] Capillary Refill: [Left:< 3 seconds] Electronic Signature(s) Signed: 12/23/2016 10:45:41 AM By: [name] Entered By: [name] on 12/23/2016 10:45:41 Sisney, Lorenso Hughes ([id number]) -------------------------------------------------------------------------------- Multi Wound Chart Details Patient Name: [name] Date of Service: 12/23/2016 10:15 AM Medical Record Number: [id number] Patient Account Number: [id number] Date of Birth/Sex: 11-Dec-1955 (61 y.o. 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] Secondary Etiology: [3:Pressure Ulcer] [N/A:N/A] Comorbid History: [3:Anemia] [N/A:N/A] Date Acquired: [3:02/07/2016] [N/A:N/A] Hughes of Treatment: [3:37] [N/A:N/A] Wound Status: [3:Healed - Epithelialized] [N/A:N/A] Measurements L x W x D 0x0x0 [N/A:N/A] (cm) Area (cm) : [3:0] [N/A:N/A]  Volume (cm) : [3:0] [N/A:N/A] % Reduction in Area: [3:100.00%] [N/A:N/A] % Reduction in Volume: 100.00% [N/A:N/A] Classification: [3:Full Thickness Without Exposed Support Structures] [N/A:N/A] Exudate Amount: [3:None Present] [N/A:N/A] Wound Margin: [3:Flat and Intact] [N/A:N/A] Granulation Amount: [3:None Present (0%)] [N/A:N/A] Necrotic Amount: [3:None Present (0%)] [N/A:N/A] Exposed Structures: [3:Fat Layer (Subcutaneous N/A Tissue) Exposed: Yes Fascia: No Tendon: No Muscle: No Joint: No Bone: No] Epithelialization: [3:None] [N/A:N/A] Periwound Skin Texture: Excoriation: No N/A N/A Induration: No Callus: No Crepitus: No Rash: No Scarring: No Periwound Skin Dry/Scaly: Yes N/A N/A Moisture: Maceration: No Periwound Skin Color: Atrophie Blanche: No N/A N/A Cyanosis: No Ecchymosis: No Erythema: No Hemosiderin Staining: No Mottled: No Pallor: No Rubor: No Temperature: No Abnormality N/A N/A Tenderness on Yes N/A N/A Palpation: Wound Preparation: Ulcer Cleansing: N/A N/A Rinsed/Irrigated with Saline Topical Anesthetic Applied: Other: lidocaine 4% Treatment  Notes Electronic Signature(s) Signed: 12/24/2016 7:47:19 AM By: Linton Ham MD Previous Signature: 12/23/2016 10:45:58 AM Version By: [name] Entered By: Linton Ham on 12/23/2016 11:06:31 [name] ([id number]) -------------------------------------------------------------------------------- Multi-Disciplinary Care Plan Details Patient Name: [name] Date of Service: 12/23/2016 10:15 AM Medical Record Number: [id number] Patient Account Number: [id number] Date of Birth/Sex: Feb 25, 1956 (61 y.o. Male) Treating RN: [name] Primary Care Michale Emmerich: [name] Other Clinician: Referring Anjelo Pullman: [name] Treating Oland Arquette/Extender: [name] in Treatment: 37 Active Inactive Electronic Signature(s) Signed: 12/23/2016 4:56:13 PM By: [name] Previous Signature: 12/23/2016 10:45:48 AM Version By: [name] Entered By: [name] on 12/23/2016 11:01:57 [name] ([id number]) -------------------------------------------------------------------------------- Pain Assessment Details Patient Name: [name] Date of Service: 12/23/2016 10:15 AM Medical Record Number: [id number] Patient Account Number: [id number] Date of Birth/Sex: 01-05-1956 (61 y.o. Male) Treating RN: [name] Primary Care Donyetta Ogletree: [name] Other Clinician: Referring Chantal Worthey: [name] Treating Eutha Cude/Extender: [name] in Treatment: 37 Active Problems Location of Pain Severity and Description of Pain Patient Has Paino Yes Site Locations Pain Location: Pain in Ulcers With Dressing Change: Yes Duration of the Pain. Constant / Intermittento Intermittent Pain Management and Medication Current Pain Management: Notes Topical or injectable lidocaine is offered to patient for acute pain when surgical debridement is performed. If needed, Patient is instructed to use over the counter pain medication for the following 24-48  hours after debridement. Wound care MDs do not prescribed pain medications. Patient has chronic pain or uncontrolled pain. Patient has been instructed to make an appointment with their Primary Care Physician for pain management. Electronic Signature(s) Signed: 12/23/2016 4:56:13 PM By: [name] Entered By: [name] on 12/23/2016 10:29:41 [name] ([id number]) -------------------------------------------------------------------------------- Patient/Caregiver Education Details Patient Name: [name] Date of Service: 12/23/2016 10:15 AM Medical Record Number: [id number] Patient Account Number: [id number] Date of Birth/Gender: 1956-06-14 (61 y.o. Male) Treating RN: [name] Primary Care Physician: [name] Other Clinician: Referring Physician: [name] Treating Physician/Extender: [name] in Treatment: 56 Education Assessment Education Provided To: Caregiver Education Topics Provided Wound/Skin Impairment: Handouts: Other: wound care as ordered Methods: Demonstration, Explain/Verbal Responses: State content correctly Electronic Signature(s) Signed: 12/23/2016 4:56:13 PM By: [name] Entered By: [name] on 12/23/2016 10:46:41 Fraticelli, Lorenso Hughes ([id number]) -------------------------------------------------------------------------------- Wound Assessment Details Patient Name: [name] Date of Service: 12/23/2016 10:15 AM Medical Record Number: [id number] Patient Account Number: [id number] Date of Birth/Sex: 1956-06-25 (61 y.o. Male) Treating RN: [name] Primary Care Konnor Vondrasek: [name] Other Clinician: Referring Deshon Koslowski: [name] Treating Herberta Pickron/Extender: [name] in Treatment: 37 Wound Status Wound Number: 3 Primary Etiology: Arterial Insufficiency Ulcer  Wound Location: Left Calcaneus Secondary Etiology: Pressure Ulcer Wounding Event: Pressure Injury Wound Status: Healed -  Epithelialized Date Acquired: 02/07/2016 Comorbid History: Anemia Hughes Of Treatment: 37 Clustered Wound: No Photos Photo Uploaded By: [name] on 12/23/2016 11:23:06 Wound Measurements Length: (cm) 0 % Reduction in Width: (cm) 0 % Reduction in Depth: (cm) 0 Epithelializati Area: (cm) 0 Tunneling: Volume: (cm) 0 Undermining: Area: 100% Volume: 100% on: None No No Wound Description Full Thickness Without Exposed Foul Odor Afte Classification: Support Structures Slough/Fibrino Wound Margin: Flat and Intact Exudate None Present Amount: r Cleansing: No No Wound Bed Granulation Amount: None Present (0%) Exposed Structure Necrotic Amount: None Present (0%) Fascia Exposed: No Fat Layer (Subcutaneous Tissue) Exposed: Yes Tendon Exposed: No Muscle Exposed: No Falco, Matis ([id number]) Joint Exposed: No Bone Exposed: No Periwound Skin Texture Texture Color No Abnormalities Noted: No No Abnormalities Noted: No Callus: No Atrophie Blanche: No Crepitus: No Cyanosis: No Excoriation: No Ecchymosis: No Induration: No Erythema: No Rash: No Hemosiderin Staining: No Scarring: No Mottled: No Pallor: No Moisture Rubor: No No Abnormalities Noted: No Dry / Scaly: Yes Temperature / Pain Maceration: No Temperature: No Abnormality Tenderness on Palpation: Yes Wound Preparation Ulcer Cleansing: Rinsed/Irrigated with Saline Topical Anesthetic Applied: Other: lidocaine 4%, Electronic Signature(s) Signed: 12/23/2016 4:56:13 PM By: [name] Previous Signature: 12/23/2016 10:44:44 AM Version By: [name] Entered By: [name] on 12/23/2016 11:01:34 [name] ([id number]) -------------------------------------------------------------------------------- Vitals Details Patient Name: [name] Date of Service: 12/23/2016 10:15 AM Medical Record Number: [id number] Patient Account Number: [id number] Date of Birth/Sex: 22-Apr-1956 (62 y.o.  Male) Treating RN: [name] Primary Care Dmarion Perfect: [name] Other Clinician: Referring Erminie Foulks: [name] Treating Tavio Biegel/Extender: [name] in Treatment: 37 Vital Signs Time Taken: 10:29 Pulse (bpm): 67 Height (in): 70 Respiratory Rate (breaths/min): 16 Blood Pressure (mmHg): 111/70 Reference Range: 80 - 120 mg / dl Electronic Signature(s) Signed: 12/23/2016 4:56:13 PM By: [name] Entered By: [name] on 12/23/2016 10:32:23

## 2016-12-24 NOTE — Progress Notes (Signed)
[name], [name] ([id number]) Visit Report for 12/23/2016 Chief Complaint Document Details Patient Name: [name], [name] Date of Service: 12/23/2016 10:15 AM Medical Record Number: [id number] Patient Account Number: [id number] Date of Birth/Sex: 1955/12/19 (61 y.o. Male) Treating RN: [name] Primary Hughes Provider: Threasa Hughes Other Clinician: Referring Provider: Threasa Hughes Treating Provider/Extender: [name] in Treatment: 81 Information Obtained from: Patient Chief Complaint here for follow up on bilateral heel pressure ulcers Electronic Signature(s) Signed: 12/24/2016 7:47:19 AM By: Larry Ham MD Entered By: [name] on 12/23/2016 11:06:49 [name] Hughes ([id number]) -------------------------------------------------------------------------------- HPI Details Patient Name: [name] Date of Service: 12/23/2016 10:15 AM Medical Record Number: [id number] Patient Account Number: [id number] Date of Birth/Sex: [birth year] (61 y.o. Male) Treating RN: [name] Primary Hughes Provider: Threasa Hughes Other Clinician: Referring Provider: Threasa Hughes Treating Provider/Extender: [name] in Treatment: 56 History of Present Illness HPI Description: 04/08/16; this is a patient we really don't know too much about. He was hospitalized from 3/24 through 4/6 residing with a sodium of 167 creatinine of 2.49 right lower lobe pneumonia sepsis/septic shock syndrome. He apparently came back to the assisted living where he was living "[name]" assisted living. He apparently had been there only 2 Hughes before he went out. I know very little about him premorbidly. The hospital he had a CT scan of the head that showed atrophy and chronic small vessel white matter ischemic changes and remote infarcts also noted within the bilateral basal ganglia. His echocardiogram and presentation showed a EF of 20-25%, left ventricle was moderately dilated with  diffuse hypokinesis. Apparently he return to his assisted living with wounds on his bilateral feet. According to the attendant came with him he could walk before he went out he could no longer walk now. There are wounds on both heels covered with a black eschar and also wounds on his bilateral dorsal feet. ABIs calculated in our clinic for 0.49 bilaterally. He was a smoker up to 2 months ago he is not smoking now. 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 Monophasic waves distally bilaterally. He is going back at the end of the month I believe June 29 probably for an arteriogram as arranged by [name] in the meantime he has had no real changes 04/30/16; we are waiting vascular review on June 29. The black eschar over his bilateral heels is beginning to separate using Santyl. The areas on the dorsal aspect of his feet looked considerably better/resolved. He apparently has no complaints. He also has a severe presumably ischemic cardiomyopathy as noted above 05/21/16:pt returns Hughes for ongoing evaluation of wounds. I reviewed recent venous and arterial diagnostic findings. He is scheduled for a LLE angio on 05/26/16 and RLE angio on 06/02/16 for advanced evaluation of PAD as noted per lower extremity arterial duplex. BLE venous duplex was negative for venous reflux, DVT or insufficiency. 06/10/16; this is a patient I have not seen in over a month although he was seen here on 7/12. When I admitted him to our clinic I felt he had pressure ulcerations but also potential for limb threatening ischemia. I referred him urgently to vascular surgery. Since we have last seen him in this clinic he has undergone 2 procedures. On 7/17 he will underwent percutaneous transluminal angioplasty of the left above-knee popliteal artery as well as  the  mid to distal SFA. On 7/27 he underwent for cutaneous transluminal angioplasty of the right popliteal artery and SFA an angioplasty of the proximal SFA. He had a stent placement in the right popliteal artery. The patient is difficult to gauge symptomatology although he does not currently complain of pain. Our intake nurse reports purulent drainage out of the left heel and an odor. 06/17/2016 -- right heel x-ray and left heel x-ray -- IMPRESSION:No radiographic evidence of osteomyelitis. Soft tissue changes are consistent with cellulitis. He had a wound culture which is positive for abundant Citrobacter fundi and pseudomonas aeruginosa. [name], [name] ([id number]) Both are sensitive to ciprofloxacin and at the present time the patient is on doxycycline 100 mg, prescribed by [name] last week. I will prescribe ciprofloxacin for him Hughes. 07/16/16 it is been quite a while since I've seen these wounds however both of them are quite a bit better. Using [place], being followed by advanced home [facility]. 07/30/16; I see this man every 2 Hughes. He had ischemic wounds on his bilateral heels. He has been using Santyl with advanced home health coming out to his facility 08/12/16 Patient comes in Hughes for a follow-up visit we see him every 2 Hughes at this point in time. He has bilateral heel wounds the we have been applying Santyl to and he has home health coming out to this facility for these dressing changes. These wounds seem to be improving dramatically since he had the procedures with vascular to stent and improve his blood flow in the bilateral lower extremities. Currently he has been responding well to debridement with [name] as well. He tells me at this point he really has no sniffing discomfort or pain at this time. He is tolerating the dressing changes well at this point as well. 08/26/16; patient comes in for his two-week follow-up. He has bilateral heel pressure ulcers that were complicated  by severe ischemia now corrected. Has been using Santyl to these areas. 09/17/2016 - [name] with an assistance from the facility that he resides at. Mr. Alcivar offers no complaints, answering most questions "no" or "okay"; most information obtained from the assistant. Heel protectors are now only being worn at night, not throughout the day and the patient has a history of resting heels directly on the ground throughout the day versus on the foot rests of the wheelchair (R>L). Home health continues to provide dressing changes. The caregiver/attendant was encouraged to bring in heel protectors to next Hughes visit for evaluation in addition, the patient and the attendant were encouraged to wear to heel protector at all times. Mr Jorde remains non-ambulatory 10/08/16; patient arrives Hughes in 3 week follow-up. When he was last seen here our staff and providers felt that he was well on his way to healing. He has home health changing the dressings. He arrives Hughes with an odor coming out of the left heel that smelt like "rotten potatoes" according to our intake nurse. He is not been systemically unwell. The individual who accompanies him from the facility states that he uses his body boots at night but sits in the wheelchair rubbing his heal up and down on the dorsal aspect of the other foot. 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 Now status post revascularization. He had been doing quite nicely however last week presented with a very significant deterioration in the bilateral ulcers. Especially  the area on the left had an older. CandS showed abundant Pseudomonas and Proteus. I empirically put him on ciprofloxacin although the Proteus was only intermediate in terms of its sensitivity. We have been using Aquacel Ag 10/22/16; follow-up of bilateral heel ulcers which were  initially a combination of pressure and severe PAD. Now status post revascularization he had been doing quite well and most of [place] felt these were going to heal however he developed recurrent trauma caused by rubbing his heels against the dorsal aspect of the other foot he subsequently developed an infection with Proteus and Pseudomonas. I gave him Cipro however the protein was was only intermediately sensitive. I therefore have him on a 10 day course of Cefdinir 10/29/16; follow-up of bilateral heel ulcers which were initially a combination of pressure and severe PAD. He is now status post revascularization and has been doing quite well. In fact the right heel is healed. He has a continued problem with the left heel. He should be just about completing antibiotics. 11/19/16; follow-up bilateral heel ulcers which were initially a combination of pressure and severe PAD. He is now status post revascularization on the right heel is actually closed and we had close this over one to 2 Hughes ago. Following up on the left heel Hughes 12/03/16; the right heel remains covered and I'm not exactly sure if this surface is completely viable however no debridement is done Hughes. The left heel still has a small but deep open area which is only observed after removal of a considerable amount of eschar as usual. Culture from last week of the purulent drainage of the left heel grew MRSA and Proteus. I gave him a combination of Cefdinir and doxycycline. 12/23/16; both his bilateral heel ulcers or closed and covered with eschar. Using a #3 curet I carefully [place], Larry ([id number]) removed a lot of this and there is no visible open area. We are left with a situation where he has skin directly over bone, there is no subcutaneous tissue palpable here. As such there will be a risk of ongoing ulceration if there is any degree of unrelieved pressure over this area. Nevertheless for the moment I think this is in "healed"  situation. No evidence of ongoing infection. He is completed his antibiotic Electronic Signature(s) Signed: 12/24/2016 7:47:19 AM By: Larry Ham MD Entered By: [name] on 12/23/2016 11:08:35 [name] ([id number]) -------------------------------------------------------------------------------- Physical Exam Details Patient Name: [name] Date of Service: 12/23/2016 10:15 AM Medical Record Number: [id number] Patient Account Number: [id number] Date of Birth/Sex: [birth year] (61 y.o. Male) Treating RN: [name] Primary Hughes Provider: Threasa Hughes Other Clinician: Referring Provider: Threasa Hughes Treating Provider/Extender: [name] in Treatment: 37 Constitutional Sitting or standing Blood Pressure is within target range for patient.. Pulse regular and within target range for patient.Marland Kitchen Respirations regular, non-labored and within target range.. Temperature is normal and within the target range for the patient.. Patient's appearance is neat and clean. Appears in no acute distress. Well nourished and well developed.. Cardiovascular Pedal pulses palpable and strong bilaterally.. Notes Wound exam; the right heel still is fully closed. The left heel has a similar surface of the right. Using a #3 curette I remove surface eschar but was unable to really view any open area. Electronic Signature(s) Signed: 12/24/2016 7:47:19 AM By: Larry Ham MD Entered By: [name] on 12/23/2016 11:10:32 [name] ([id number]) -------------------------------------------------------------------------------- Physician Orders Details Patient Name: [name] Date of Service: 12/23/2016 10:15 AM Medical Record Number: [id number] Patient  Account Number: [id number] Date of Birth/Sex: February 26, 1956 (61 y.o. Male) Treating RN: [name] Primary Hughes Provider: Threasa Hughes Other Clinician: Referring Provider: Threasa Hughes Treating Provider/Extender:  [name] in Treatment: 26 Verbal / Phone Orders: No Diagnosis Coding Discharge From [facility] Services o Discharge from [place] please continue monitoring heels for 2-3 Hughes to make sure wounds do not reopen. Then may discharge patient from [place] Signature(s) Signed: 12/23/2016 4:56:13 PM By: [name] Signed: 12/24/2016 7:47:19 AM By: Larry Ham MD Entered By: [name] on 12/23/2016 11:03:18 [name] ([id number]) -------------------------------------------------------------------------------- Problem List Details Patient Name: [name] Date of Service: 12/23/2016 10:15 AM Medical Record Number: [id number] Patient Account Number: [id number] Date of Birth/Sex: 02-Apr-1956 (61 y.o. 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 04/08/2016 Yes of other part of foot Inactive Problems Resolved Problems ICD-10 Code Description Active Date Resolved Date L89.610 Pressure ulcer of right heel, unstageable 04/08/2016 04/08/2016 L89.620 Pressure ulcer of left heel, unstageable 04/08/2016 04/08/2016 Electronic Signature(s) Signed: 12/24/2016 7:47:19 AM By: Larry Ham MD Entered By: [name] on 12/23/2016 11:06:14 [place], [name] ([id number]) -------------------------------------------------------------------------------- Progress Note Details Patient Name: [name] Date of Service: 12/23/2016 10:15  AM Medical Record Number: [id number] Patient Account Number: [id number] Date of Birth/Sex: August 25, 1956 (61 y.o. Male) Treating RN: [name] Primary Hughes Provider: Threasa Hughes Other Clinician: Referring Provider: Threasa Hughes Treating Provider/Extender: [name] in Treatment: 21 Subjective Chief Complaint Information obtained from Patient here for follow up on bilateral heel pressure ulcers History of Present Illness (HPI) 04/08/16; this is a patient we really don't know too much about. He was hospitalized from 3/24 through 4/6 residing with a sodium of 167 creatinine of 2.49 right lower lobe pneumonia sepsis/septic shock syndrome. He apparently came back to the assisted living where he was living "[name]" assisted living. He apparently had been there only 2 Hughes before he went out. I know very little about him premorbidly. The hospital he had a CT scan of the head that showed atrophy and chronic small vessel white matter ischemic changes and remote infarcts also noted within the bilateral basal ganglia. His echocardiogram and presentation showed a EF of 20-25%, left ventricle was moderately dilated with diffuse hypokinesis. Apparently he return to his assisted living with wounds on his bilateral feet. According to the attendant came with him he could walk before he went out he could no longer walk now. There are wounds on both heels covered with a black eschar and also wounds on his bilateral dorsal feet. ABIs calculated in our clinic for 0.49 bilaterally. He was a smoker up to 2 months ago he is not smoking now. 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 Monophasic waves distally bilaterally. He is going back at the end of the month I believe June 29 probably for an arteriogram as arranged  by [name] in the meantime he  has had no real changes 04/30/16; we are waiting vascular review on June 29. The black eschar over his bilateral heels is beginning to separate using Santyl. The areas on the dorsal aspect of his feet looked considerably better/resolved. He apparently has no complaints. He also has a severe presumably ischemic cardiomyopathy as noted above 05/21/16:pt returns Hughes for ongoing evaluation of wounds. I reviewed recent venous and arterial diagnostic findings. He is scheduled for a LLE angio on 05/26/16 and RLE angio on 06/02/16 for advanced evaluation of PAD as noted per lower extremity arterial duplex. BLE venous duplex was negative for venous reflux, DVT or insufficiency. 06/10/16; this is a patient I have not seen in over a month although he was seen here on 7/12. When I admitted him to our clinic I felt he had pressure ulcerations but also potential for limb threatening ischemia. I referred him urgently to vascular surgery. Since we have last seen him in this clinic he has undergone 2 procedures. On 7/17 he will underwent percutaneous transluminal angioplasty of the left above-knee popliteal artery as well as the mid to distal SFA. On 7/27 he underwent for cutaneous transluminal angioplasty of the right popliteal artery and SFA an angioplasty of the proximal SFA. He had a stent placement in the right popliteal artery. The patient is difficult to gauge symptomatology although he does not currently complain of pain. Our intake nurse reports purulent drainage out of the left heel and an odor. [name], [name] ([id number]) 06/17/2016 -- right heel x-ray and left heel x-ray -- IMPRESSION:No radiographic evidence of osteomyelitis. Soft tissue changes are consistent with cellulitis. He had a wound culture which is positive for abundant Citrobacter fundi and pseudomonas aeruginosa. Both are sensitive to ciprofloxacin and at the present time the patient is on doxycycline 100 mg,  prescribed by [name] last week. I will prescribe ciprofloxacin for him Hughes. 07/16/16 it is been quite a while since I've seen these wounds however both of them are quite a bit better. Using [place], being followed by advanced home [facility]. 07/30/16; I see this man every 2 Hughes. He had ischemic wounds on his bilateral heels. He has been using Santyl with advanced home health coming out to his facility 08/12/16 Patient comes in Hughes for a follow-up visit we see him every 2 Hughes at this point in time. He has bilateral heel wounds the we have been applying Santyl to and he has home health coming out to this facility for these dressing changes. These wounds seem to be improving dramatically since he had the procedures with vascular to stent and improve his blood flow in the bilateral lower extremities. Currently he has been responding well to debridement with [name] as well. He tells me at this point he really has no sniffing discomfort or pain at this time. He is tolerating the dressing changes well at this point as well. 08/26/16; patient comes in for his two-week follow-up. He has bilateral heel pressure ulcers that were complicated by severe ischemia now corrected. Has been using Santyl to these areas. 09/17/2016 - Mr. Hailu arrives Hughes with an assistance from the facility that he resides at. Mr. Clift offers no complaints, answering most questions "no" or "okay"; most information obtained from the assistant. Heel protectors are now only being worn at night, not throughout the day and the patient has a history of resting heels directly on the ground throughout the day versus on the foot rests of the wheelchair (R>L). Home health continues to provide  dressing changes. The caregiver/attendant was encouraged to bring in heel protectors to next Hughes visit for evaluation in addition, the patient and the attendant were encouraged to wear to heel protector at all times. Mr Gates remains  non-ambulatory 10/08/16; patient arrives Hughes in 3 week follow-up. When he was last seen here our staff and providers felt that he was well on his way to healing. He has home health changing the dressings. He arrives Hughes with an odor coming out of the left heel that smelt like "rotten potatoes" according to our intake nurse. He is not been systemically unwell. The individual who accompanies him from the facility states that he uses his body boots at night but sits in the wheelchair rubbing his heal up and down on the dorsal aspect of the other foot. 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 Now status post revascularization. He had been doing quite nicely however last week presented with a very significant deterioration in the bilateral ulcers. Especially the area on the left had an older. CandS showed abundant Pseudomonas and Proteus. I empirically put him on ciprofloxacin although the Proteus was only intermediate in terms of its sensitivity. 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 Now status post revascularization he had been doing quite well and most of [place] felt these were going to heal however he developed recurrent trauma caused by rubbing his heels against the dorsal aspect of the other foot he subsequently developed an infection with Proteus and Pseudomonas. I gave him Cipro however the protein was was only intermediately sensitive. I therefore have him on a 10 day course of Cefdinir 10/29/16; follow-up of bilateral heel ulcers which were initially a combination of pressure and severe PAD. He is now status post revascularization and has been doing quite well. In fact the right heel is healed. He has a continued problem with the left heel. He should be just about completing  antibiotics. 11/19/16; follow-up bilateral heel ulcers which were initially a combination of pressure and severe PAD. He is now status post revascularization on the right heel is actually closed and we had close this over one to 2 Hughes ago. Following up on the left heel Hughes [name], [name] ([id number]) 12/03/16; the right heel remains covered and I'm not exactly sure if this surface is completely viable however no debridement is done Hughes. The left heel still has a small but deep open area which is only observed after removal of a considerable amount of eschar as usual. Culture from last week of the purulent drainage of the left heel grew MRSA and Proteus. I gave him a combination of Cefdinir and doxycycline. 12/23/16; both his bilateral heel ulcers or closed and covered with eschar. Using a #3 curet I carefully removed a lot of this and there is no visible open area. We are left with a situation where he has skin directly over bone, there is no subcutaneous tissue palpable here. As such there will be a risk of ongoing ulceration if there is any degree of unrelieved pressure over this area. Nevertheless for the moment I think this is in "healed" situation. No evidence of ongoing infection. He is completed his antibiotic Objective Constitutional Sitting or standing Blood Pressure is within target range for patient.. Pulse regular and within target range for patient.Marland Kitchen Respirations regular,  non-labored and within target range.. Temperature is normal and within the target range for the patient.. Patient's appearance is neat and clean. Appears in no acute distress. Well nourished and well developed.. Vitals Time Taken: 10:29 AM, Height: 70 in, Pulse: 67 bpm, Respiratory Rate: 16 breaths/min, Blood Pressure: 111/70 mmHg. Cardiovascular Pedal pulses palpable and strong bilaterally.. General Notes: Wound exam; the right heel still is fully closed. The left heel has a similar surface of the right.  Using a #3 curette I remove surface eschar but was unable to really view any open area. Integumentary (Hair, Skin) Wound #3 status is Healed - Epithelialized. Original cause of wound was Pressure Injury. The wound is located on the Left Calcaneus. The wound measures 0cm length x 0cm width x 0cm depth; 0cm^2 area and 0cm^3 volume. There is Fat Layer (Subcutaneous Tissue) Exposed exposed. There is no tunneling or undermining noted. There is a none present amount of drainage noted. The wound margin is flat and intact. There is no granulation within the wound bed. There is no necrotic tissue within the wound bed. The periwound skin appearance exhibited: Dry/Scaly. The periwound skin appearance did not exhibit: Callus, Crepitus, Excoriation, Induration, Rash, Scarring, Maceration, Atrophie Blanche, Cyanosis, Ecchymosis, Hemosiderin Staining, Mottled, Pallor, Rubor, Erythema. Periwound temperature was noted as No Abnormality. The periwound has tenderness on palpation. 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 Then may discharge patient from [facility] services both areas are closed albeit with not the best of surfaces. Nevertheless there is no open wound and no drainage. I think he can be discharged to heelcups and his bilateral bunny botts which he uses 24/7 per the addendent from his facility. Electronic Signature(s) Signed: 12/24/2016 7:47:19 AM By: Larry Ham MD Entered By: [name] on 12/23/2016 11:12:22 [name]  ([id number]) -------------------------------------------------------------------------------- SuperBill Details Patient Name: [name] Date of Service: 12/23/2016 Medical Record Number: [id number] Patient Account Number: [id number] Date of Birth/Sex: July 04, 1956 (61 y.o. 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 I70.245 Atherosclerosis of native arteries of left leg with ulceration of other part of foot Facility Procedures CPT4 Code: [id number] Description: [id number] - WOUND Hughes VISIT-LEV 2 EST PT Modifier: Quantity: 1 Physician Procedures CPT4 Code: [id number] Description: [id number] - WC PHYS LEVEL 2 - EST PT ICD-10 Description Diagnosis L89.613 Pressure ulcer of right heel, stage 3 L89.623 Pressure ulcer of left heel, stage 3 Modifier: Quantity: 1 Electronic Signature(s) Signed: 12/23/2016 4:56:13 PM By: [name] Signed: 12/24/2016 7:47:19 AM By: Larry Ham MD Entered By: [name] on 12/23/2016 11:27:42

## 2017-01-14 ENCOUNTER — Inpatient Hospital Stay
Admission: EM | Admit: 2017-01-14 | Discharge: 2017-01-20 | DRG: 640 | Disposition: A | Discharge status: Home / Self Care | Payer: Medicare Other | Attending: Internal Medicine | Admitting: Internal Medicine

## 2017-01-14 DIAGNOSIS — R358 Other polyuria: Secondary | ICD-10-CM | POA: Diagnosis present

## 2017-01-14 DIAGNOSIS — I447 Left bundle-branch block, unspecified: Secondary | ICD-10-CM | POA: Diagnosis present

## 2017-01-14 DIAGNOSIS — E1151 Type 2 diabetes mellitus with diabetic peripheral angiopathy without gangrene: Secondary | ICD-10-CM | POA: Diagnosis present

## 2017-01-14 DIAGNOSIS — I10 Essential (primary) hypertension: Secondary | ICD-10-CM | POA: Diagnosis present

## 2017-01-14 DIAGNOSIS — E860 Dehydration: Secondary | ICD-10-CM | POA: Diagnosis present

## 2017-01-14 DIAGNOSIS — E878 Other disorders of electrolyte and fluid balance, not elsewhere classified: Secondary | ICD-10-CM | POA: Diagnosis present

## 2017-01-14 DIAGNOSIS — Z7902 Long term (current) use of antithrombotics/antiplatelets: Secondary | ICD-10-CM

## 2017-01-14 DIAGNOSIS — N179 Acute kidney failure, unspecified: Secondary | ICD-10-CM | POA: Diagnosis present

## 2017-01-14 DIAGNOSIS — Z681 Body mass index (BMI) 19 or less, adult: Secondary | ICD-10-CM

## 2017-01-14 DIAGNOSIS — I959 Hypotension, unspecified: Secondary | ICD-10-CM | POA: Diagnosis present

## 2017-01-14 DIAGNOSIS — E43 Unspecified severe protein-calorie malnutrition: Secondary | ICD-10-CM | POA: Insufficient documentation

## 2017-01-14 DIAGNOSIS — Z993 Dependence on wheelchair: Secondary | ICD-10-CM | POA: Diagnosis not present

## 2017-01-14 DIAGNOSIS — E876 Hypokalemia: Secondary | ICD-10-CM | POA: Diagnosis present

## 2017-01-14 DIAGNOSIS — I69351 Hemiplegia and hemiparesis following cerebral infarction affecting right dominant side: Secondary | ICD-10-CM | POA: Diagnosis not present

## 2017-01-14 DIAGNOSIS — Z79899 Other long term (current) drug therapy: Secondary | ICD-10-CM | POA: Diagnosis not present

## 2017-01-14 DIAGNOSIS — Z66 Do not resuscitate: Secondary | ICD-10-CM | POA: Diagnosis present

## 2017-01-14 DIAGNOSIS — R001 Bradycardia, unspecified: Secondary | ICD-10-CM | POA: Diagnosis present

## 2017-01-14 DIAGNOSIS — Z87891 Personal history of nicotine dependence: Secondary | ICD-10-CM | POA: Diagnosis not present

## 2017-01-14 DIAGNOSIS — F209 Schizophrenia, unspecified: Secondary | ICD-10-CM | POA: Diagnosis present

## 2017-01-14 DIAGNOSIS — Z7982 Long term (current) use of aspirin: Secondary | ICD-10-CM | POA: Diagnosis not present

## 2017-01-14 DIAGNOSIS — D649 Anemia, unspecified: Secondary | ICD-10-CM | POA: Diagnosis present

## 2017-01-14 DIAGNOSIS — E870 Hyperosmolality and hypernatremia: Secondary | ICD-10-CM | POA: Diagnosis present

## 2017-01-14 HISTORY — DX: Type 2 diabetes mellitus without complications: E11.9

## 2017-01-14 LAB — BASIC METABOLIC PANEL
BUN: 45 mg/dL — AB (ref 6–20)
CALCIUM: 9.5 mg/dL (ref 8.9–10.3)
CO2: 23 mmol/L (ref 22–32)
Creatinine, Ser: 1.55 mg/dL — ABNORMAL HIGH (ref 0.61–1.24)
GFR calc Af Amer: 55 mL/min — ABNORMAL LOW (ref >60)
GFR, EST NON AFRICAN AMERICAN: 47 mL/min — AB (ref >60)
GLUCOSE: 123 mg/dL — AB (ref 65–99)
POTASSIUM: 3.6 mmol/L (ref 3.5–5.1)
SODIUM: 180 mmol/L — AB (ref 135–145)

## 2017-01-14 LAB — CBC
HEMATOCRIT: 28.5 % — AB (ref 40.0–52.0)
HEMOGLOBIN: 9.1 g/dL — AB (ref 13.0–18.0)
MCH: 31.7 pg (ref 26.0–34.0)
MCHC: 32.1 g/dL (ref 32.0–36.0)
MCV: 98.5 fL (ref 80.0–100.0)
Platelets: 132 10*3/uL — ABNORMAL LOW (ref 150–440)
RBC: 2.89 MIL/uL — ABNORMAL LOW (ref 4.40–5.90)
RDW: 15.6 % — AB (ref 11.5–14.5)
WBC: 4.5 10*3/uL (ref 3.8–10.6)

## 2017-01-14 LAB — GLUCOSE, CAPILLARY
GLUCOSE-CAPILLARY: 173 mg/dL — AB (ref 65–99)
Glucose-Capillary: 98 mg/dL (ref 65–99)
Glucose-Capillary: 135 mg/dL — ABNORMAL HIGH (ref 65–99)

## 2017-01-14 LAB — TSH: TSH: 0.309 u[IU]/mL — ABNORMAL LOW (ref 0.350–4.500)

## 2017-01-14 LAB — MRSA PCR SCREENING: MRSA BY PCR: NEGATIVE

## 2017-01-14 MED ORDER — SODIUM CHLORIDE 0.9 % IV BOLUS (SEPSIS)
1000.0000 mL | Freq: Once | INTRAVENOUS | Status: AC
  Administered 2017-01-14: 1000 mL via INTRAVENOUS

## 2017-01-14 MED ORDER — ATORVASTATIN CALCIUM 10 MG PO TABS
10.0000 mg | ORAL_TABLET | Freq: Every day | ORAL | Status: DC
  Administered 2017-01-15 – 2017-01-19 (×5): 10 mg via ORAL
  Filled 2017-01-14 (×6): qty 1

## 2017-01-14 MED ORDER — LORAZEPAM 0.5 MG PO TABS
0.5000 mg | ORAL_TABLET | Freq: Two times a day (BID) | ORAL | Status: DC | PRN
  Administered 2017-01-18: 0.5 mg via ORAL
  Filled 2017-01-14: qty 1

## 2017-01-14 MED ORDER — LEVETIRACETAM 100 MG/ML PO SOLN
500.0000 mg | Freq: Two times a day (BID) | ORAL | Status: DC
  Administered 2017-01-14 – 2017-01-20 (×11): 500 mg via ORAL
  Filled 2017-01-14 (×14): qty 5

## 2017-01-14 MED ORDER — ENOXAPARIN SODIUM 40 MG/0.4ML ~~LOC~~ SOLN: 40.0000 mg | SUBCUTANEOUS | Status: DC

## 2017-01-14 MED ORDER — DEXTROSE 5 % IV SOLN
INTRAVENOUS | Status: DC
  Administered 2017-01-14: 23:00:00 via INTRAVENOUS
  Administered 2017-01-14: 1000 mL via INTRAVENOUS
  Administered 2017-01-15: 05:00:00 via INTRAVENOUS

## 2017-01-14 MED ORDER — SERTRALINE HCL 50 MG PO TABS
50.0000 mg | ORAL_TABLET | Freq: Every day | ORAL | Status: DC
  Administered 2017-01-15 – 2017-01-19 (×5): 50 mg via ORAL
  Filled 2017-01-14 (×5): qty 1

## 2017-01-14 MED ORDER — INSULIN ASPART 100 UNIT/ML ~~LOC~~ SOLN
0.0000 [IU] | Freq: Every day | SUBCUTANEOUS | Status: DC
Start: 2017-01-14 — End: 2017-01-20

## 2017-01-14 MED ORDER — ENOXAPARIN SODIUM 30 MG/0.3ML ~~LOC~~ SOLN
30.0000 mg | SUBCUTANEOUS | Status: DC
  Administered 2017-01-14 – 2017-01-15 (×2): 30 mg via SUBCUTANEOUS
  Filled 2017-01-14 (×2): qty 0.3

## 2017-01-14 MED ORDER — ASPIRIN EC 81 MG PO TBEC
81.0000 mg | DELAYED_RELEASE_TABLET | Freq: Every day | ORAL | Status: DC
  Administered 2017-01-15 – 2017-01-20 (×5): 81 mg via ORAL
  Filled 2017-01-14 (×6): qty 1

## 2017-01-14 MED ORDER — SENNOSIDES-DOCUSATE SODIUM 8.6-50 MG PO TABS: 1.0000 | ORAL_TABLET | Freq: Every evening | ORAL | Status: DC | PRN

## 2017-01-14 MED ORDER — ACETAMINOPHEN 650 MG RE SUPP: 650.0000 mg | Freq: Four times a day (QID) | RECTAL | Status: DC | PRN

## 2017-01-14 MED ORDER — ONDANSETRON HCL 4 MG/2ML IJ SOLN: 4.0000 mg | Freq: Four times a day (QID) | INTRAMUSCULAR | Status: DC | PRN

## 2017-01-14 MED ORDER — ONDANSETRON HCL 4 MG PO TABS: 4.0000 mg | ORAL_TABLET | Freq: Four times a day (QID) | ORAL | Status: DC | PRN

## 2017-01-14 MED ORDER — INSULIN ASPART 100 UNIT/ML ~~LOC~~ SOLN
0.0000 [IU] | Freq: Three times a day (TID) | SUBCUTANEOUS | Status: DC
  Administered 2017-01-15: 2 [IU] via SUBCUTANEOUS
  Administered 2017-01-15: 1 [IU] via SUBCUTANEOUS
  Administered 2017-01-15: 2 [IU] via SUBCUTANEOUS
  Administered 2017-01-16: 3 [IU] via SUBCUTANEOUS
  Administered 2017-01-17 – 2017-01-18 (×2): 1 [IU] via SUBCUTANEOUS
  Administered 2017-01-18: 2 [IU] via SUBCUTANEOUS
  Administered 2017-01-18 – 2017-01-19 (×2): 1 [IU] via SUBCUTANEOUS
  Filled 2017-01-14 (×3): qty 1
  Filled 2017-01-14: qty 2
  Filled 2017-01-14: qty 1
  Filled 2017-01-14 (×2): qty 2
  Filled 2017-01-14 (×2): qty 1
  Filled 2017-01-14: qty 3

## 2017-01-14 MED ORDER — IPRATROPIUM-ALBUTEROL 0.5-2.5 (3) MG/3ML IN SOLN: 3.0000 mL | RESPIRATORY_TRACT | Status: DC | PRN

## 2017-01-14 MED ORDER — CLOPIDOGREL BISULFATE 75 MG PO TABS
75.0000 mg | ORAL_TABLET | Freq: Every day | ORAL | Status: DC
  Administered 2017-01-15 – 2017-01-20 (×5): 75 mg via ORAL
  Filled 2017-01-14 (×6): qty 1

## 2017-01-14 MED ORDER — DOCUSATE SODIUM 100 MG PO CAPS
100.0000 mg | ORAL_CAPSULE | Freq: Two times a day (BID) | ORAL | Status: DC
  Administered 2017-01-14 – 2017-01-19 (×10): 100 mg via ORAL
  Filled 2017-01-14 (×11): qty 1

## 2017-01-14 MED ORDER — MIDODRINE HCL 5 MG PO TABS
5.0000 mg | ORAL_TABLET | Freq: Two times a day (BID) | ORAL | Status: DC
  Administered 2017-01-14 – 2017-01-20 (×11): 5 mg via ORAL
  Filled 2017-01-14 (×12): qty 1

## 2017-01-14 MED ORDER — ACETAMINOPHEN 325 MG PO TABS
650.0000 mg | ORAL_TABLET | Freq: Four times a day (QID) | ORAL | Status: DC | PRN
Start: 2017-01-14 — End: 2017-01-20

## 2017-01-14 NOTE — ED Notes (Signed)
Pt provided cup of coffee per request.  MD aware.

## 2017-01-14 NOTE — ED Notes (Signed)
Pericare performed and clean brief placed.  Pt alert, calm and cooperative at this time.

## 2017-01-14 NOTE — ED Notes (Signed)
Pt changed gown. Pt watching TV a this time. NS infusing.

## 2017-01-14 NOTE — ED Notes (Signed)
EDP at bedside  

## 2017-01-14 NOTE — ED Triage Notes (Addendum)
Pt sent to ER via EMS from his group home, address on paperwork sent with patient. Sent for "abnormal lab". Pt and EMS unable to state what was abnormal. Pt answers yes or no to questions, disoriented to place situation or time with per EMS from group home caregiver is normal for patient. Pt able to state his name and state no when asked if he feels ok. 90/60 BP with EMS. No CBG checked with EMS. Pt arms and legs contracted upon arrival.   Side rails up X 2, call bell within reach, bed lowered. Pt within view of RN. Primary RN, Caryl Pina notified that pt is here.

## 2017-01-14 NOTE — H&P (Signed)
[facility] NAME: [name]    MR#:  [id number]  DATE OF BIRTH:  [birth year]  DATE OF ADMISSION:  01/14/2017  PRIMARY CARE PHYSICIAN: Vista Mink, FNP   REQUESTING/REFERRING PHYSICIAN: Dr Archie Balboa  CHIEF COMPLAINT:  Patient was brought in from his group home with abnormal labs.  HISTORY OF PRESENT ILLNESS:  [name]  is a 61 y.o. male with a known history of Schizophrenia, CVA, peripheral vascular disease, hypertension, comes to the emergency room from group home. Patient is a poor historian does not verbalize much. He tells me his last name only. He was found to have sodium of 180 and chloride 142. Patient is brought in with dehydration and severe acute hypernatremia and is being admitted for further evaluation and management he received a liter of normal saline in the emergency room he is alert and awake No staff member from the group home is present.  PAST MEDICAL HISTORY:   Past Medical History:  Diagnosis Date  . Anginal pain ([place])   . Depression   . Discontinued smoking   . Hypertension   . Peripheral vascular disease ([place])   . Schizophrenia ([place])   . Stroke [facility])     PAST SURGICAL HISTOIRY:   Past Surgical History:  Procedure Laterality Date  . PERIPHERAL VASCULAR CATHETERIZATION Left 05/26/2016   Procedure: Lower Extremity Angiography;  Surgeon: Algernon Huxley, MD;  Location: [place] CV [facility];  Service: Cardiovascular;  Laterality: Left;  . PERIPHERAL VASCULAR CATHETERIZATION  05/26/2016   Procedure: Lower Extremity Intervention;  Surgeon: Algernon Huxley, MD;  Location: [place] CV LAB;  Service: Cardiovascular;;  . PERIPHERAL VASCULAR CATHETERIZATION Right 06/05/2016   Procedure: Lower Extremity Angiography;  Surgeon: Algernon Huxley, MD;  Location: [place] CV LAB;  Service: Cardiovascular;  Laterality: Right;  . periphervascular procedures      SOCIAL HISTORY:   Social History   Substance Use Topics  . Smoking status: Former Smoker    Packs/day: 1.00    Years: 30.00    Types: Cigarettes  . Smokeless tobacco: Never Used  . Alcohol use No     Comment: pt UTA    FAMILY HISTORY:  No family history on file.  DRUG ALLERGIES:  Not on File  REVIEW OF SYSTEMS:  Review of Systems  Unable to perform ROS: Medical condition     MEDICATIONS AT HOME:   Prior to Admission medications   Medication Sig Start Date End Date Taking? Authorizing Provider  acetaminophen (TYLENOL) 650 MG suppository Place 650 mg rectally every 6 (six) hours as needed for mild pain, moderate pain or fever.   Yes Historical Provider, MD  aspirin EC 81 MG tablet Take 1 tablet (81 mg total) by mouth daily. 05/26/16  Yes Algernon Huxley, MD  atorvastatin (LIPITOR) 10 MG tablet Take 1 tablet (10 mg total) by mouth daily. 05/26/16  Yes Algernon Huxley, MD  bisacodyl (DULCOLAX) 10 MG suppository Place 10 mg rectally as needed for moderate constipation.   Yes Historical Provider, MD  clopidogrel (PLAVIX) 75 MG tablet Take 1 tablet (75 mg total) by mouth daily. 05/26/16  Yes Algernon Huxley, MD  ipratropium-albuterol (DUONEB) 0.5-2.5 (3) MG/3ML SOLN Take 3 mLs by nebulization every 4 (four) hours as needed. 02/14/16  Yes Colleen Can, MD  levETIRAcetam (KEPPRA) 100 MG/ML solution Take 5 mLs (500 mg total) by mouth 2 (two) times daily. 02/14/16  Yes Colleen Can, MD  LORazepam (ATIVAN) 0.5 MG tablet Take 1 tablet (0.5 mg total) by mouth 2 (two) times daily as needed (anxiety or agitation). 02/13/16  Yes Bettey Costa, MD  Magnesium Hydroxide (MILK OF MAGNESIA PO) Take 30-60 mLs by mouth daily as needed.   Yes Historical Provider, MD  midodrine (PROAMATINE) 5 MG tablet Take 1 tablet (5 mg total) by mouth 2 (two) times daily with a meal. 02/14/16  Yes Colleen Can, MD  prochlorperazine (COMPAZINE) 25 MG suppository Place 25 mg rectally every 8 (eight) hours as needed for nausea or vomiting.   Yes Historical  Provider, MD  sennosides-docusate sodium (SENOKOT-S) 8.6-50 MG tablet Take 1-2 tablets by mouth 2 (two) times daily as needed for constipation.   Yes Historical Provider, MD  sertraline (ZOLOFT) 50 MG tablet Take 1 tablet (50 mg total) by mouth at bedtime. 02/13/16  Yes Bettey Costa, MD      VITAL SIGNS:  Blood pressure 107/74, pulse (!) 54, temperature 97.5 F (36.4 C), temperature source Axillary, resp. rate 18, weight 52.2 kg (115 lb), SpO2 100 %.  PHYSICAL EXAMINATION:  GENERAL:  61 y.o.-year-old patient lying in the bed with no acute distress. Cachectic thin EYES: Pupils equal, round, reactive to light and accommodation. No scleral icterus. Extraocular muscles intact.  HEENT: Head atraumatic, normocephalic. Oropharynx and nasopharynx clear.  NECK:  Supple, no jugular venous distention. No thyroid enlargement, no tenderness.  LUNGS: Normal breath sounds bilaterally, no wheezing, rales,rhonchi or crepitation. No use of accessory muscles of respiration. Pacemaker  CARDIOVASCULAR: S1, S2 normal. No murmurs, rubs, or gallops.  ABDOMEN: Soft, nontender, nondistended. Bowel sounds present. No organomegaly or mass.  EXTREMITIES: No pedal edema, cyanosis, or clubbing.  NEUROLOGIC: Moves all extremities well. Patient is a poor historian. Unable to do thorough exam  PSYCHIATRIC: The patient is alert awake  SKIN: Chronic ulcers in both lower extremities. He has chronic heel protectors.  LABORATORY PANEL:   CBC  Recent Labs Lab 01/14/17 1202  WBC 4.5  HGB 9.1*  HCT 28.5*  PLT 132*   ------------------------------------------------------------------------------------------------------------------  Chemistries   Recent Labs Lab 01/14/17 1202  NA 180*  K 3.6  CL >130*  CO2 23  GLUCOSE 123*  BUN 45*  CREATININE 1.55*  CALCIUM 9.5   ------------------------------------------------------------------------------------------------------------------  Cardiac Enzymes No results for  input(s): TROPONINI in the last 168 hours. ------------------------------------------------------------------------------------------------------------------  RADIOLOGY:  No results found.  EKG:    IMPRESSION AND PLAN:   [name]  is a 61 y.o. male with a known history of Schizophrenia, CVA, peripheral vascular disease, hypertension, comes to the emergency room from group home. Patient is a poor historian does not verbalize much. He tells me his last name only. He was found to have sodium of 180 and chloride 142.  1. Acute hypernatremia/acute hyperchloremia with severe dehydration -Admit to medical floor -IV D5 water at 1 50 cc an hour. -Nephrology consultation -Monitor I's and O's  2. Acute dehydration/acute renal failure -IV fluids   3. History of schizophrenia patient is from a group home  4. Peripheral vascular disease with bilateral lower extremity wounds -wound care consult -Continue aspirin Plavix  5. DVT prophylaxis subcutaneous Lovenox   All the records are reviewed and case discussed with ED provider No family members or staff member from the group home present   CODE STATUS: [place]: 40es.    Camdynn Maranto M.D on 01/14/2017 at 2:12 PM  Between 7am to 6pm - Pager - [phone]  After  6pm go to [email or web address] - password [email or web address] [facility]  [facility]  Office  [phone]  CC: Primary care physician; Vista Mink, FNP

## 2017-01-14 NOTE — ED Notes (Signed)
Lab at bedside to collect blood.  

## 2017-01-14 NOTE — Progress Notes (Signed)
RN attempted to to do in and out catheter on patient to collect urinalysis and met resistance. Patient incontinent. MD notified.   Deri Fuelling, RN

## 2017-01-14 NOTE — ED Notes (Signed)
Attempted PIV x 6 via multiple ED staff; PIV obtained but unable to receive blood.  Lab notified at this time to collect blood.

## 2017-01-14 NOTE — ED Notes (Addendum)
Pt stuck by this RN X 2 for IV access, unsuccessful. By Sharee Pimple, RN X 2 unsuccessful and by Lanny Hurst, EMTP X 2, 1 of which was unsuccessful.   No blood obtained for lab collection. Lab called by Caryl Pina, RN to come obtain bloodwork.

## 2017-01-14 NOTE — Progress Notes (Signed)
Anticoagulation monitoring(Lovenox):  60yo  M ordered Lovenox 40 mg Q24h  Filed Weights   01/14/17 1117  Weight: 115 lb (52.2 kg)   BMI 18   Lab Results  Component Value Date   CREATININE 1.55 (H) 01/14/2017   CREATININE 1.04 05/30/2016   CREATININE 0.99 05/23/2016   Estimated Creatinine Clearance: 37.9 mL/min (by C-G formula based on SCr of 1.55 mg/dL (H)). Hemoglobin & Hematocrit     Component Value Date/Time   HGB 9.1 (L) 01/14/2017 1202   HCT 28.5 (L) 01/14/2017 1202     Per Protocol for Patient with estCrcl > 30 ml/min and weight < 57 kg in male patient, will transition to Lovenox 30 mg Q24h      Chinita Greenland PharmD Clinical Pharmacist 01/14/2017

## 2017-01-14 NOTE — Progress Notes (Addendum)
Admission:  Patient alert and disoriented X4 per baseline. Lung sounds diminished and heart sounds normal. Patient has pacemaker that shows on left upper chest. Patient on tele sinus brady. Patient from [facility]. RN had to contact them for patient information, history, care, ect. Patient is DNR at this facility per facility worker. Patient is contracted and has been immobile for "year" per [facility]. Patient does not have advanced directive or legal guardian. Patient is non-verbal per baseline. Pink foams on both heels with Prevalon Boots, scabbing on bot heels from healed wounds. Pillow between his knees.   Deri Fuelling, RN

## 2017-01-14 NOTE — ED Provider Notes (Signed)
[facility] Emergency Department Provider Note   ____________________________________________   I have reviewed the triage vital signs and the nursing notes.   HISTORY  Chief Complaint Abnormal Lab   History limited by: AMS   HPI [name] is a 61 y.o. male who presents to the emergency department today from group home because of concerns for abnormal labs. Per paperwork sent over via group home main lab of concern is sodium which was 180. This blood was drawn 2 days ago. Patient himself is unable to answer any questions.   Past Medical History:  Diagnosis Date  . Anginal pain ([place])   . Depression   . Discontinued smoking   . Hypertension   . Peripheral vascular disease ([place])   . Schizophrenia ([place])   . Stroke [facility])     Patient Active Problem List   Diagnosis Date Noted  . Collapse of lung   . Mucus plugging of bronchi   . Acute on chronic respiratory failure ([place])   . Pressure ulcer 02/04/2016  . Right lower lobe pneumonia ([place]) 02/01/2016  . Septic shock ([place]) 02/01/2016  . Hypernatremia 02/01/2016  . Acute renal failure (ARF) ([place]) 02/01/2016  . Lactic acidosis 02/01/2016  . Metabolic encephalopathy 97/67/3419  . Thrombocytopenia ([place]) 02/01/2016  . Anemia 02/01/2016  . Sepsis ([place]) 02/01/2016    Past Surgical History:  Procedure Laterality Date  . PERIPHERAL VASCULAR CATHETERIZATION Left 05/26/2016   Procedure: Lower Extremity Angiography;  Surgeon: Algernon Huxley, MD;  Location: [place] CV LAB;  Service: Cardiovascular;  Laterality: Left;  . PERIPHERAL VASCULAR CATHETERIZATION  05/26/2016   Procedure: Lower Extremity Intervention;  Surgeon: Algernon Huxley, MD;  Location: [place] CV LAB;  Service: Cardiovascular;;  . PERIPHERAL VASCULAR CATHETERIZATION Right 06/05/2016   Procedure: Lower Extremity Angiography;  Surgeon: Algernon Huxley, MD;  Location: [place] [facility];  Service: Cardiovascular;  Laterality: Right;  .  periphervascular procedures      Prior to Admission medications   Medication Sig Start Date End Date Taking? Authorizing Provider  aspirin EC 81 MG tablet Take 1 tablet (81 mg total) by mouth daily. Patient not taking: Reported on 05/30/2016 05/26/16   Algernon Huxley, MD  atorvastatin (LIPITOR) 10 MG tablet Take 1 tablet (10 mg total) by mouth daily. Patient not taking: Reported on 05/30/2016 05/26/16   Algernon Huxley, MD  clopidogrel (PLAVIX) 75 MG tablet Take 1 tablet (75 mg total) by mouth daily. Patient not taking: Reported on 05/30/2016 05/26/16   Algernon Huxley, MD  ipratropium-albuterol (DUONEB) 0.5-2.5 (3) MG/3ML SOLN Take 3 mLs by nebulization every 4 (four) hours as needed. Patient not taking: Reported on 05/30/2016 02/14/16   Colleen Can, MD  levETIRAcetam (KEPPRA) 100 MG/ML solution Take 5 mLs (500 mg total) by mouth 2 (two) times daily. 02/14/16   Colleen Can, MD  LORazepam (ATIVAN) 0.5 MG tablet Take 1 tablet (0.5 mg total) by mouth 2 (two) times daily as needed (anxiety or agitation). 02/13/16   Bettey Costa, MD  midodrine (PROAMATINE) 5 MG tablet Take 1 tablet (5 mg total) by mouth 2 (two) times daily with a meal. 02/14/16   Colleen Can, MD  sertraline (ZOLOFT) 50 MG tablet Take 1 tablet (50 mg total) by mouth at bedtime. 02/13/16   Bettey Costa, MD    Allergies Patient has no allergy information on record.  No family history on file.  Social History Social History  Substance Use Topics  .  Smoking status: Former Smoker    Packs/day: 1.00    Years: 30.00    Types: Cigarettes  . Smokeless tobacco: Never Used  . Alcohol use No     Comment: pt UTA    Review of Systems Unable to obtain ____________________________________________   PHYSICAL EXAM:  VITAL SIGNS: ED Triage Vitals  Enc Vitals Group     BP 01/14/17 1116 100/60     Pulse Rate 01/14/17 1116 80     Resp 01/14/17 1116 15     Temp 01/14/17 1116 97.5 F (36.4 C)     Temp Source 01/14/17 1116 Axillary      SpO2 01/14/17 1116 100 %     Weight 01/14/17 1117 115 lb (52.2 kg)    Constitutional: Awake, alert.  Eyes: Conjunctivae are normal. Normal extraocular movements. ENT   Head: Normocephalic and atraumatic.   Nose: No congestion/rhinnorhea.   Mouth/Throat: Mucous membranes are moist.   Neck: No stridor. Hematological/Lymphatic/Immunilogical: No cervical lymphadenopathy. Cardiovascular: Normal rate, regular rhythm.  No murmurs, rubs, or gallops.  Respiratory: Normal respiratory effort without tachypnea nor retractions. Breath sounds are clear and equal bilaterally. No wheezes/rales/rhonchi. Gastrointestinal: Soft and non tender. No rebound. No guarding.  Genitourinary: Deferred Musculoskeletal: Normal range of motion in all extremities. No lower extremity edema. Neurologic:  Normal speech and language. No gross focal neurologic deficits are appreciated.  Skin:  Skin is warm, dry and intact. No rash noted. Psychiatric: Mood and affect are normal. Speech and behavior are normal. Patient exhibits appropriate insight and judgment.  ____________________________________________    LABS (pertinent positives/negatives)  Labs Reviewed  BASIC METABOLIC PANEL - Abnormal; Notable for the following:       Result Value   Sodium 180 (*)    Chloride >130 (*)    Glucose, Bld 123 (*)    BUN 45 (*)    Creatinine, Ser 1.55 (*)    GFR calc non Af Amer 47 (*)    GFR calc Af Amer 55 (*)    All other components within normal limits  CBC - Abnormal; Notable for the following:    RBC 2.89 (*)    Hemoglobin 9.1 (*)    HCT 28.5 (*)    RDW 15.6 (*)    Platelets 132 (*)    All other components within normal limits  TSH - Abnormal; Notable for the following:    TSH 0.309 (*)    All other components within normal limits  GLUCOSE, CAPILLARY  URINALYSIS, COMPLETE (UACMP) WITH MICROSCOPIC  VITAMIN D 25 HYDROXY (VIT D DEFICIENCY, FRACTURES)  CBG MONITORING, ED      ____________________________________________   EKG  I, Nance Pear, attending physician, personally viewed and interpreted this EKG  EKG Time: 1121 Rate: 48 Rhythm: sinus bradycardia Axis: normal Intervals: qtc 448 QRS: LBBB ST changes: no st elevation Impression: abnormal ekg   ____________________________________________    RADIOLOGY  None  ____________________________________________   PROCEDURES  Procedures  CRITICAL CARE Performed by: Nance Pear   Total critical care time: 20 minutes  Critical care time was exclusive of separately billable procedures and treating other patients.  Critical care was necessary to treat or prevent imminent or life-threatening deterioration.  Critical care was time spent personally by me on the following activities: development of treatment plan with patient and/or surrogate as well as nursing, discussions with consultants, evaluation of patient's response to treatment, examination of patient, obtaining history from patient or surrogate, ordering and performing treatments and interventions, ordering and review of  laboratory studies, ordering and review of radiographic studies, pulse oximetry and re-evaluation of patient's condition.  ____________________________________________   INITIAL IMPRESSION / ASSESSMENT AND PLAN / ED COURSE  Pertinent labs & imaging results that were available during my care of the patient were reviewed by me and considered in my medical decision making (see chart for details).  Patient presented to the emergency department today because of concerns for abnormal lab work. Patient's sodium was found to be 180 here in the emergency department. Patient was started on normal saline. Patient will be admitted to the hospitalist service.  ____________________________________________   FINAL CLINICAL IMPRESSION(S) / ED DIAGNOSES  Final diagnoses:  Hypernatremia  Hyperchloremia      Note: This dictation was prepared with Dragon dictation. Any transcriptional errors that result from this process are unintentional     Nance Pear, MD 01/14/17 1524

## 2017-01-15 LAB — BASIC METABOLIC PANEL
BUN: 38 mg/dL — AB (ref 6–20)
BUN: 33 mg/dL — AB (ref 6–20)
CO2: 21 mmol/L — ABNORMAL LOW (ref 22–32)
CO2: 25 mmol/L (ref 22–32)
CREATININE: 1.47 mg/dL — AB (ref 0.61–1.24)
Calcium: 8.2 mg/dL — ABNORMAL LOW (ref 8.9–10.3)
Calcium: 8.6 mg/dL — ABNORMAL LOW (ref 8.9–10.3)
Creatinine, Ser: 1.42 mg/dL — ABNORMAL HIGH (ref 0.61–1.24)
GFR calc Af Amer: >60 mL/min (ref >60)
GFR calc Af Amer: 59 mL/min — ABNORMAL LOW (ref >60)
GFR calc non Af Amer: 53 mL/min — ABNORMAL LOW (ref >60)
GFR calc non Af Amer: 50 mL/min — ABNORMAL LOW (ref >60)
GLUCOSE: 185 mg/dL — AB (ref 65–99)
Glucose, Bld: 200 mg/dL — ABNORMAL HIGH (ref 65–99)
POTASSIUM: 2.7 mmol/L — AB (ref 3.5–5.1)
Potassium: 3.9 mmol/L (ref 3.5–5.1)
SODIUM: 162 mmol/L — AB (ref 135–145)
SODIUM: 163 mmol/L — AB (ref 135–145)

## 2017-01-15 LAB — URINALYSIS, COMPLETE (UACMP) WITH MICROSCOPIC
BACTERIA UA: NONE SEEN
BILIRUBIN URINE: NEGATIVE
GLUCOSE, UA: NEGATIVE mg/dL
HGB URINE DIPSTICK: NEGATIVE
KETONES UR: NEGATIVE mg/dL
Leukocytes, UA: NEGATIVE
NITRITE: NEGATIVE
PROTEIN: NEGATIVE mg/dL
Specific Gravity, Urine: 1.012 (ref 1.005–1.030)
Squamous Epithelial / LPF: NONE SEEN
pH: 5 (ref 5.0–8.0)

## 2017-01-15 LAB — GLUCOSE, CAPILLARY
GLUCOSE-CAPILLARY: 177 mg/dL — AB (ref 65–99)
GLUCOSE-CAPILLARY: 144 mg/dL — AB (ref 65–99)
GLUCOSE-CAPILLARY: 196 mg/dL — AB (ref 65–99)
Glucose-Capillary: 144 mg/dL — ABNORMAL HIGH (ref 65–99)

## 2017-01-15 LAB — OSMOLALITY, URINE: Osmolality, Ur: 469 mOsm/kg (ref 300–900)

## 2017-01-15 LAB — OSMOLALITY: Osmolality: 352 mOsm/kg (ref 275–295)

## 2017-01-15 LAB — SODIUM
SODIUM: 161 mmol/L — AB (ref 135–145)
Sodium: 165 mmol/L (ref 135–145)

## 2017-01-15 LAB — MAGNESIUM: MAGNESIUM: 2.3 mg/dL (ref 1.7–2.4)

## 2017-01-15 LAB — VITAMIN D 25 HYDROXY (VIT D DEFICIENCY, FRACTURES): Vit D, 25-Hydroxy: 16.3 ng/mL — ABNORMAL LOW (ref 30.0–100.0)

## 2017-01-15 MED ORDER — POTASSIUM CHLORIDE 20 MEQ PO PACK
40.0000 meq | PACK | ORAL | Status: DC
Start: 2017-01-15 — End: 2017-01-15
  Administered 2017-01-15 (×2): 40 meq via ORAL
  Filled 2017-01-15 (×2): qty 2

## 2017-01-15 MED ORDER — POTASSIUM CL IN DEXTROSE 5% 20 MEQ/L IV SOLN
20.0000 meq | INTRAVENOUS | Status: DC
  Administered 2017-01-15: 20 meq via INTRAVENOUS
  Filled 2017-01-15 (×3): qty 1000

## 2017-01-15 MED ORDER — ENSURE ENLIVE PO LIQD
237.0000 mL | Freq: Two times a day (BID) | ORAL | Status: DC
  Administered 2017-01-15 – 2017-01-20 (×7): 237 mL via ORAL

## 2017-01-15 MED ORDER — VITAMIN D (ERGOCALCIFEROL) 1.25 MG (50000 UNIT) PO CAPS
50000.0000 [IU] | ORAL_CAPSULE | ORAL | Status: DC
  Administered 2017-01-15: 50000 [IU] via ORAL
  Filled 2017-01-15 (×2): qty 1

## 2017-01-15 MED ORDER — POTASSIUM CL IN DEXTROSE 5% 20 MEQ/L IV SOLN
20.0000 meq | INTRAVENOUS | Status: DC
  Administered 2017-01-15: 20 meq via INTRAVENOUS
  Filled 2017-01-15 (×2): qty 1000

## 2017-01-15 NOTE — Progress Notes (Addendum)
Charted wrong pt

## 2017-01-15 NOTE — NC FL2 (Signed)
[place] LEVEL OF CARE SCREENING TOOL     IDENTIFICATION  Patient Name: [name] Birthdate: October 08, 1956 Sex: male Admission Date (Current Location): 01/14/2017  [facility] and [place] Number:  Selena Lesser  ([id number]) Facility and Address:  [facility], [address] [place], [place] [address]      Provider Number: [id number]  Attending Physician Name and Address:  Gladstone Lighter, MD  Relative Name and Phone Number:       Current Level of Care: Hospital Recommended Level of Care: [facility] Prior Approval Number:    Date Approved/Denied:   PASRR Number:  ([id number] )  Discharge Plan: Domiciliary (Rest home)    Current Diagnoses: Patient Active Problem List   Diagnosis Date Noted  . Acute hypernatremia 01/14/2017  . Collapse of lung   . Mucus plugging of bronchi   . Acute on chronic respiratory failure ([place])   . Pressure ulcer 02/04/2016  . Right lower lobe pneumonia ([place]) 02/01/2016  . Septic shock ([place]) 02/01/2016  . Hypernatremia 02/01/2016  . Acute renal failure (ARF) ([place]) 02/01/2016  . Lactic acidosis 02/01/2016  . Metabolic encephalopathy 12/87/8676  . Thrombocytopenia ([place]) 02/01/2016  . Anemia 02/01/2016  . Sepsis ([place]) 02/01/2016    Orientation RESPIRATION BLADDER Height & Weight     Self  Normal Continent Weight: 115 lb (52.2 kg) Height:  5\' 7"  (170.2 cm)  BEHAVIORAL SYMPTOMS/MOOD NEUROLOGICAL BOWEL NUTRITION STATUS   (none)  (none) Continent Diet (Soft Diet )  AMBULATORY STATUS COMMUNICATION OF NEEDS Skin   Extensive Assist (Wheel Chair bound ) Verbally PU Stage and Appropriate Care (Pressure Ulcer on bilateral heels (wears boots all the time). )                       Personal Care Assistance Level of Assistance  Bathing, Feeding, Dressing Bathing Assistance: Limited assistance Feeding assistance: Limited assistance Dressing Assistance: Limited assistance     Functional Limitations  Info  Sight, Hearing, Speech Sight Info: Adequate Hearing Info: Adequate Speech Info: Adequate    SPECIAL CARE FACTORS FREQUENCY                       Contractures      Additional Factors Info  Code Status, Allergies Code Status Info:  (DNR ) Allergies Info:  (not on file )           Current Medications (01/15/2017):  This is the current hospital active medication list Current Facility-Administered Medications  Medication Dose Route Frequency Provider Last Rate Last Dose  . acetaminophen (TYLENOL) tablet 650 mg  650 mg Oral Q6H PRN Fritzi Mandes, MD       Or  . acetaminophen (TYLENOL) suppository 650 mg  650 mg Rectal Q6H PRN Fritzi Mandes, MD      . aspirin EC tablet 81 mg  81 mg Oral Daily Fritzi Mandes, MD   81 mg at 01/15/17 0839  . atorvastatin (LIPITOR) tablet 10 mg  10 mg Oral Daily Fritzi Mandes, MD      . clopidogrel (PLAVIX) tablet 75 mg  75 mg Oral Daily Fritzi Mandes, MD   75 mg at 01/15/17 7209  . dextrose 5 % with KCl 20 mEq / L  infusion  20 mEq Intravenous Continuous Murlean Iba, MD 75 mL/hr at 01/15/17 1117 20 mEq at 01/15/17 1117  . docusate sodium (COLACE) capsule 100 mg  100 mg Oral BID Fritzi Mandes, MD   100 mg  at 01/15/17 0839  . enoxaparin (LOVENOX) injection 30 mg  30 mg Subcutaneous Q24H Fritzi Mandes, MD   30 mg at 01/14/17 2243  . insulin aspart (novoLOG) injection 0-5 Units  0-5 Units Subcutaneous QHS Fritzi Mandes, MD      . insulin aspart (novoLOG) injection 0-9 Units  0-9 Units Subcutaneous TID WC Fritzi Mandes, MD   2 Units at 01/15/17 0840  . ipratropium-albuterol (DUONEB) 0.5-2.5 (3) MG/3ML nebulizer solution 3 mL  3 mL Nebulization Q4H PRN Fritzi Mandes, MD      . levETIRAcetam (KEPPRA) 100 MG/ML solution 500 mg  500 mg Oral BID Fritzi Mandes, MD   500 mg at 01/15/17 0912  . LORazepam (ATIVAN) tablet 0.5 mg  0.5 mg Oral BID PRN Fritzi Mandes, MD      . midodrine (PROAMATINE) tablet 5 mg  5 mg Oral BID WC Fritzi Mandes, MD   5 mg at 01/15/17 3646  . ondansetron (ZOFRAN)  tablet 4 mg  4 mg Oral Q6H PRN Fritzi Mandes, MD       Or  . ondansetron (ZOFRAN) injection 4 mg  4 mg Intravenous Q6H PRN Fritzi Mandes, MD      . potassium chloride (KLOR-CON) packet 40 mEq  40 mEq Oral Q4H Gladstone Lighter, MD   40 mEq at 01/15/17 0906  . senna-docusate (Senokot-S) tablet 1 tablet  1 tablet Oral QHS PRN Fritzi Mandes, MD      . sertraline (ZOLOFT) tablet 50 mg  50 mg Oral QHS Fritzi Mandes, MD      . Vitamin D (Ergocalciferol) (DRISDOL) capsule 50,000 Units  50,000 Units Oral Q7 days Gladstone Lighter, MD         Discharge Medications: Please see discharge summary for a list of discharge medications.  Relevant Imaging Results:  Relevant Lab Results:   Additional Information  (SSN: [id number])  Zadrian Mccauley, Veronia Beets, LCSW

## 2017-01-15 NOTE — Progress Notes (Signed)
Initial Nutrition Assessment  DOCUMENTATION CODES:   Underweight, Severe malnutrition in context of chronic illness  INTERVENTION:  - Continue with Ensure Enlive BID each supplement provides 350 calories and 20 grams protein - Recommend changing diet to Dysphagia III, d/t difficulty chewing - Recommend assistance with feeding at meal times  NUTRITION DIAGNOSIS:   Malnutrition related to chronic illness as evidenced by severe depletion of muscle mass, severe depletion of body fat.  GOAL:   Patient will meet greater than or equal to 90% of their needs  MONITOR:   PO intake, I & O's, Weight trends  REASON FOR ASSESSMENT:   Consult  (Malnutrition)  ASSESSMENT:   61 y.o. Male with PMH schizophrenia, CVA, HTN, DM, Peripheral vascular disease, anginal pain, depression presents with acute hypernatremia.   Pt could not communicate at time of visit, could not provide nutritional history.  Per chart review pt has limited weight history recorded.  Per RN report pt is a feeder and requires assistance at meal times. Pt consumed 50% of breakfast, he is currently consuming lunch and she thinks he could finish it all. RN reported pt consumed all of his Ensure this morning, claiming he is very hungry and thirsty. RN asked if his meats could be chopped because he chews for an extended period of time.   Labs reviewed; CBG (98-177), Na (162), Vitamin D 25 Hydroxy (16.3)-supplemented, K (2.7)-supplemented Medications reviewed; Colace, Potassium chloride, Vitamin D, Dextrose with KCl at 75 mL/hr  Nutrition-focused physical exam completed. Findings are severe fat depletion, severe muscle depletion, and no edema.  Diet Order:  DIET SOFT Room service appropriate? Yes; Fluid consistency: Thin  Skin:  Wound (see comment) (Deep tissue injury to R and L heel)  Last BM:  PTA  Height:   Ht Readings from Last 1 Encounters:  01/14/17 5\' 7"  (1.702 m)    Weight:   Wt Readings from Last 1  Encounters:  01/14/17 115 lb (52.2 kg)    Ideal Body Weight:  67.3 kg  BMI:  Body mass index is 18.01 kg/m.  Estimated Nutritional Needs:   Kcal:  1550-1850   Protein:  75-85 grams (1.4-1.6 g/kg)  Fluid:  >/= 1.5 L/d  EDUCATION NEEDS:   Education needs no appropriate at this time  [employer]

## 2017-01-15 NOTE — Progress Notes (Signed)
Lab called to notify pts potasium 2.7, NA 162. MD [facility] paged and notified. MD placing orders.

## 2017-01-15 NOTE — Consult Note (Signed)
Date: 01/15/2017                  Patient Name:  [name]  MRN: [id number]  DOB: 04/05/1956  Age / Sex: 61 y.o., male         PCP: Vista Mink, FNP                 Service Requesting Consult: IM                 Reason for Consult: Hypernatermia            History of Present Illness: Patient is a 61 y.o. male with medical problems of schizophrenia, CVA, PVD, HTN , who was admitted to [facility] on 01/14/2017 for evaluation of dehydration.  Patient not able to provide much information Na 180 Given D5 @ 150 cc / hr yesterday Today's Na is 162 K 2.7   Medications: Outpatient medications: Prescriptions Prior to Admission  Medication Sig Dispense Refill Last Dose  . aspirin EC 81 MG tablet Take 1 tablet (81 mg total) by mouth daily. 150 tablet 2 01/14/2017 at 0800  . atorvastatin (LIPITOR) 10 MG tablet Take 1 tablet (10 mg total) by mouth daily. 30 tablet 5 01/14/2017 at 0800  . bisacodyl (DULCOLAX) 10 MG suppository Place 10 mg rectally as needed for moderate constipation.   PRN at PRN  . clopidogrel (PLAVIX) 75 MG tablet Take 1 tablet (75 mg total) by mouth daily. 30 tablet 11 01/14/2017 at 0800  . ipratropium-albuterol (DUONEB) 0.5-2.5 (3) MG/3ML SOLN Take 3 mLs by nebulization every 4 (four) hours as needed. 30 mL 0 PRN at PRN  . levETIRAcetam (KEPPRA) 100 MG/ML solution Take 5 mLs (500 mg total) by mouth 2 (two) times daily. 473 mL 0 01/14/2017 at 0800  . LORazepam (ATIVAN) 0.5 MG tablet Take 1 tablet (0.5 mg total) by mouth 2 (two) times daily as needed (anxiety or agitation). 30 tablet 0 PRN at PRN  . Magnesium Hydroxide (MILK OF MAGNESIA PO) Take 30-60 mLs by mouth daily as needed.   PRN at PRN  . midodrine (PROAMATINE) 5 MG tablet Take 1 tablet (5 mg total) by mouth 2 (two) times daily with a meal. 30 tablet 0 01/14/2017 at 0800  . prochlorperazine (COMPAZINE) 25 MG suppository Place 25 mg rectally every 8 (eight) hours as needed for nausea or vomiting.   PRN at PRN  .  sennosides-docusate sodium (SENOKOT-S) 8.6-50 MG tablet Take 1-2 tablets by mouth 2 (two) times daily as needed for constipation.   PRN at PRN  . sertraline (ZOLOFT) 50 MG tablet Take 1 tablet (50 mg total) by mouth at bedtime. 30 tablet 0 01/14/2017 at 0800  . acetaminophen (TYLENOL) 650 MG suppository Place 650 mg rectally every 6 (six) hours as needed for mild pain, moderate pain or fever.   PRN    Current medications: Current Facility-Administered Medications  Medication Dose Route Frequency Provider Last Rate Last Dose  . acetaminophen (TYLENOL) tablet 650 mg  650 mg Oral Q6H PRN Fritzi Mandes, MD       Or  . acetaminophen (TYLENOL) suppository 650 mg  650 mg Rectal Q6H PRN Fritzi Mandes, MD      . aspirin EC tablet 81 mg  81 mg Oral Daily Fritzi Mandes, MD   81 mg at 01/15/17 0839  . atorvastatin (LIPITOR) tablet 10 mg  10 mg Oral Daily Fritzi Mandes, MD      . clopidogrel (PLAVIX) tablet 75 mg  75 mg Oral Daily Fritzi Mandes, MD   75 mg at 01/15/17 [phone]  . dextrose 5 % with KCl 20 mEq / L  infusion  20 mEq Intravenous Continuous Murlean Iba, MD 75 mL/hr at 01/15/17 1117 20 mEq at 01/15/17 1117  . docusate sodium (COLACE) capsule 100 mg  100 mg Oral BID Fritzi Mandes, MD   100 mg at 01/15/17 0839  . enoxaparin (LOVENOX) injection 30 mg  30 mg Subcutaneous Q24H Fritzi Mandes, MD   30 mg at 01/14/17 2243  . feeding supplement (ENSURE ENLIVE) (ENSURE ENLIVE) liquid 237 mL  237 mL Oral BID BM Colletta Spillers, MD   237 mL at 01/15/17 1518  . insulin aspart (novoLOG) injection 0-5 Units  0-5 Units Subcutaneous QHS Fritzi Mandes, MD      . insulin aspart (novoLOG) injection 0-9 Units  0-9 Units Subcutaneous TID WC Fritzi Mandes, MD   1 Units at 01/15/17 1223  . ipratropium-albuterol (DUONEB) 0.5-2.5 (3) MG/3ML nebulizer solution 3 mL  3 mL Nebulization Q4H PRN Fritzi Mandes, MD      . levETIRAcetam (KEPPRA) 100 MG/ML solution 500 mg  500 mg Oral BID Fritzi Mandes, MD   500 mg at 01/15/17 0912  . LORazepam (ATIVAN) tablet 0.5 mg   0.5 mg Oral BID PRN Fritzi Mandes, MD      . midodrine (PROAMATINE) tablet 5 mg  5 mg Oral BID WC Fritzi Mandes, MD   5 mg at 01/15/17 7846  . ondansetron (ZOFRAN) tablet 4 mg  4 mg Oral Q6H PRN Fritzi Mandes, MD       Or  . ondansetron (ZOFRAN) injection 4 mg  4 mg Intravenous Q6H PRN Fritzi Mandes, MD      . potassium chloride (KLOR-CON) packet 40 mEq  40 mEq Oral Q4H Gladstone Lighter, MD   40 mEq at 01/15/17 1230  . senna-docusate (Senokot-S) tablet 1 tablet  1 tablet Oral QHS PRN Fritzi Mandes, MD      . sertraline (ZOLOFT) tablet 50 mg  50 mg Oral QHS Fritzi Mandes, MD      . Vitamin D (Ergocalciferol) (DRISDOL) capsule 50,000 Units  50,000 Units Oral Q7 days Gladstone Lighter, MD   50,000 Units at 01/15/17 1343      Allergies: Not on File    Past Medical History: Past Medical History:  Diagnosis Date  . Anginal pain ([place])   . Depression   . Diabetes mellitus without complication ([place])   . Discontinued smoking   . Hypertension   . Peripheral vascular disease ([place])   . Schizophrenia ([place])   . Stroke [facility])      Past Surgical History: Past Surgical History:  Procedure Laterality Date  . PERIPHERAL VASCULAR CATHETERIZATION Left 05/26/2016   Procedure: Lower Extremity Angiography;  Surgeon: Algernon Huxley, MD;  Location: [place] [facility];  Service: Cardiovascular;  Laterality: Left;  . PERIPHERAL VASCULAR CATHETERIZATION  05/26/2016   Procedure: Lower Extremity Intervention;  Surgeon: Algernon Huxley, MD;  Location: [place] [facility];  Service: Cardiovascular;;  . PERIPHERAL VASCULAR CATHETERIZATION Right 06/05/2016   Procedure: Lower Extremity Angiography;  Surgeon: Algernon Huxley, MD;  Location: [place] [facility];  Service: Cardiovascular;  Laterality: Right;  . periphervascular procedures       Family History: History reviewed. No pertinent family history.   Social History: Social History   Social History  . Marital status: Single    Spouse name: N/A  . Number of children: N/A  .  Years of education: N/A   Occupational History  . Not on file.   Social History Main Topics  . Smoking status: Former Smoker    Packs/day: 1.00    Years: 30.00    Types: Cigarettes  . Smokeless tobacco: Never Used  . Alcohol use No     Comment: pt UTA  . Drug use: No     Comment: unknown pt UTA  . Sexual activity: Not Currently   Other Topics Concern  . Not on file   Social History Narrative  . No narrative on file     Review of Systems: not reliable Gen:  HEENT:  CV:  Resp:  GI: GU :  MS:  Derm:   Psych: Heme:  Neuro:  Endocrine  Vital Signs: Blood pressure (!) 90/59, pulse (!) 58, temperature 97.6 F (36.4 C), temperature source Oral, resp. rate 16, height 5\' 7"  (1.702 m), weight 52.2 kg (115 lb), SpO2 100 %.   Intake/Output Summary (Last 24 hours) at 01/15/17 1653 Last data filed at 01/15/17 1638  Gross per 24 hour  Intake             3000 ml  Output              100 ml  Net             2900 ml    Weight trends: [employer]   01/14/17 1117  Weight: 52.2 kg (115 lb)    Physical Exam: General:  NAD, sitting up in bed  HEENT Dry oral mucus membranes  Neck:  supple  Lungs: Clear b/l, normal effort  Heart::  regular, no rub  Abdomen: Soft, NT  Extremities:  no edema, feet in soft boot support  Neurologic: Lethargic, followed few simple commands, not oreinted  Skin: decreased turgor             Lab results: Basic Metabolic Panel:  Recent Labs Lab 01/14/17 1202 01/15/17 0720 01/15/17 1136 01/15/17 1524  NA 180* 162* 165* 163*  K 3.6 2.7*  --  3.9  CL >130* >130*  --  >130*  CO2 23 21*  --  25  GLUCOSE 123* 200*  --  185*  BUN 45* 38*  --  33*  CREATININE 1.55* 1.42*  --  1.47*  CALCIUM 9.5 8.2*  --  8.6*  MG  --  2.3  --   --     Liver Function Tests: No results for input(s): AST, ALT, ALKPHOS, BILITOT, PROT, ALBUMIN in the last 168 hours. No results for input(s): LIPASE, AMYLASE in the last 168 hours. No results for  input(s): AMMONIA in the last 168 hours.  CBC:  Recent Labs Lab 01/14/17 1202  WBC 4.5  HGB 9.1*  HCT 28.5*  MCV 98.5  PLT 132*    Cardiac Enzymes: No results for input(s): CKTOTAL, TROPONINI in the last 168 hours.  BNP: Invalid input(s): POCBNP  CBG:  Recent Labs Lab 01/14/17 1827 01/14/17 2114 01/15/17 0735 01/15/17 1206 01/15/17 1649  GLUCAP 135* 173* 177* 144* 196*    Microbiology: Recent Results (from the past 720 hour(s))  MRSA PCR Screening     Status: None   Collection Time: 01/14/17  6:00 PM  Result Value Ref Range Status   MRSA by PCR NEGATIVE NEGATIVE Final    Comment:        The GeneXpert MRSA Assay (FDA approved for NASAL specimens only), is one component of a comprehensive MRSA colonization surveillance program. It  is not intended to diagnose MRSA infection nor to guide or monitor treatment for MRSA infections.      Coagulation Studies: No results for input(s): LABPROT, INR in the last 72 hours.  Urinalysis: No results for input(s): COLORURINE, LABSPEC, PHURINE, GLUCOSEU, HGBUR, BILIRUBINUR, KETONESUR, PROTEINUR, UROBILINOGEN, NITRITE, LEUKOCYTESUR in the last 72 hours.  Invalid input(s): APPERANCEUR      Imaging:  No results found.   Assessment & Plan: Pt is a 61 y.o. AA male with medical problems of schizophrenia, CVA, PVD, HTN , who was admitted to [facility] on 01/14/2017 for evaluation of dehydration.   1. Hypernatremia 2. Hypokalemia 3. Dehydration  Plan 1. Agree with iv d5w, but decrease rate to 75 cc/hr as correction was too fast Goal of correction to approx 160  By tomorrow morning (~ 10 meq/24 hrs) Agree with iv Kcl replacement Agree with quantification of UOP as there is some reports of polyuria Na q 6 hrs

## 2017-01-15 NOTE — Progress Notes (Signed)
MD Kalisetti placed order to obtain UA, pt incontinent MD notified,orders received for In&Out x1, MD also notified about K 3.9 and serum osmolarity 352. Orders received to discontinue potassium

## 2017-01-15 NOTE — Progress Notes (Signed)
pts trigeminy pvc reported to MD. Carlyle Dolly Verbal order received for lab mg  Order placed.

## 2017-01-15 NOTE — Progress Notes (Signed)
PT Cancellation Note  Patient Details Name: Yousuf Ager MRN: [id number] DOB: 04-Jul-1956   Cancelled Treatment:    Reason Eval/Treat Not Completed: Other (comment). Pt received consult and chart reviewed. Pt currently with K+ at 2.7 with sudden drop from previous date. Pt is currently not appropriate for therapy at this time. Will hold and re-assess next date.   Johanna Stafford 01/15/2017, 10:39 AM  Greggory Stallion, PT, DPT [phone]

## 2017-01-15 NOTE — Progress Notes (Signed)
Anticoagulation monitoring(Lovenox):  61yo  male ordered Lovenox 30 mg Q24h for DVT prevention  Filed Weights   01/14/17 1117  Weight: 115 lb (52.2 kg)    Lab Results  Component Value Date   CREATININE 1.42 (H) 01/15/2017   CREATININE 1.55 (H) 01/14/2017   CREATININE 1.04 05/30/2016   Estimated Creatinine Clearance: 41.4 mL/min (by C-G formula based on SCr of 1.42 mg/dL (H)). Hemoglobin & Hematocrit     Component Value Date/Time   HGB 9.1 (L) 01/14/2017 1202   HCT 28.5 (L) 01/14/2017 1202     Per Protocol for Patient with estCrcl > 30 ml/min and BMI < 40, will transition to Lovenox 40 mg Q24h.     Paulina Fusi, PharmD, BCPS 01/15/2017 1:24 PM

## 2017-01-15 NOTE — Clinical Social Work Note (Signed)
Clinical Social Work Assessment  Patient Details  Name: [name] MRN: [id number] Date of Birth: 1956-08-14  Date of referral:  01/15/17               Reason for consult:  Other (Comment Required) (from [facility] group home )                Permission sought to share information with:    Permission granted to share information::     Name::        Agency::     Relationship::     Contact Information:     Housing/Transportation Living arrangements for the past 2 months:  [place] of Information:  Facility Patient Interpreter Needed:  None Criminal Activity/Legal Involvement Pertinent to Current Situation/Hospitalization:  No - Comment as needed Significant Relationships:  Other(Comment) Games developer ) Lives with:  Facility Resident Do you feel safe going back to the place where you live?    Need for family participation in patient care:  Yes (Comment)  Care giving concerns:  Patient is a long term care resident at [facility] group home located at [address], [place], [place] [address], phone # [phone].    Social Worker assessment / plan:  Holiday representative (CSW) reviewed chart and noted that patient is from [facility] group home. CSW attempted to meet with patient however he was asleep and per chart and he is oriented to self only. CSW contacted [name] group home RN to complete assessment. Per [name] patient has been living at [facility] group home in [place] for over 1 year now and his family does not visit often. Per [name] patient does not have a guardian and makes his own decisions, however the group home staff assist him. Per [name] patient does not walk and at baseline uses a wheel chair. Per [name] patient can "scoot on his bottom" only. Per [name] patient was open to hospice services but has been discharged from hospice because he made improvements. Per [name] patient can return to group home when stable and will require EMS for transport.  [name] reported that patient's PCP is Nurse Practitioner [name]. [name] also reported that patient voids large amounts in his sleep. CSW made MD aware of above. CSW will continue to follow and assist as needed.   Employment status:  Disabled (Comment on whether or not currently receiving Disability) Insurance information:  Medicare, Medicaid [place] PT Recommendations:  Not assessed at this time Information / Referral to community resources:  Other (Comment Required) (Patient will return to group home. )  Patient/Family's Response to care:  [name] group home RN stated that patient's family is not very involved in his care.   Patient/Family's Understanding of and Emotional Response to Diagnosis, Current Treatment, and Prognosis: No family is listed.   Emotional Assessment Appearance:  Appears stated age Attitude/Demeanor/Rapport:  Unable to Assess Affect (typically observed):  Unable to Assess Orientation:  Oriented to Self, Fluctuating Orientation (Suspected and/or reported Sundowners) Alcohol / Substance use:  Not Applicable Psych involvement (Current and /or in the community):  No (Comment)  Discharge Needs  Concerns to be addressed:  Discharge Planning Concerns Readmission within the last 30 days:  No Current discharge risk:  Chronically ill Barriers to Discharge:  Continued Medical Work up   [employer], Larry Beets, LCSW 01/15/2017, 2:03 PM

## 2017-01-15 NOTE — Progress Notes (Signed)
[place] [facility] [place] NAME: [name]    MR#:  [id number]  DATE OF BIRTH:  04-24-1956  SUBJECTIVE:  CHIEF COMPLAINT:   Chief Complaint  Patient presents with  . Abnormal Lab   - Alert and requesting coffee - following simple commands, sodium improved- but rapidly  REVIEW OF SYSTEMS:  Review of Systems  Unable to perform ROS: Psychiatric disorder    DRUG ALLERGIES:  Not on File  VITALS:  Blood pressure 90/67, pulse (!) 57, temperature 97.6 F (36.4 C), temperature source Oral, resp. rate 16, height 5\' 7"  (1.702 m), weight 52.2 kg (115 lb), SpO2 100 %.  PHYSICAL EXAMINATION:  Physical Exam  GENERAL:  61 y.o.-year-old patient lying in the bed with no acute distress.  EYES: Pupils equal, round, reactive to light and accommodation. No scleral icterus. Extraocular muscles intact.  HEENT: Head atraumatic, normocephalic. Oropharynx and nasopharynx clear.  NECK:  Supple, no jugular venous distention. No thyroid enlargement, no tenderness.  LUNGS: Normal breath sounds bilaterally, no wheezing, rales,rhonchi or crepitation. No use of accessory muscles of respiration.  CARDIOVASCULAR: S1, S2 normal. No  rubs, or gallops. 3/6 systolic murmur present. ABDOMEN: Soft, nontender, nondistended. Bowel sounds present. No organomegaly or mass.  EXTREMITIES: No pedal edema, cyanosis, or clubbing. Atrophic lower extremities NEUROLOGIC: following some simple commands, unable to move RLE, 2/5 RUE with contracted hand, 4/5 LUE and 3/5 LLE strength noted. Sensation is intact on left side PSYCHIATRIC: The patient is alert.  SKIN: No obvious rash, lesion, or ulcer.    LABORATORY PANEL:   CBC  Recent Labs Lab 01/14/17 1202  WBC 4.5  HGB 9.1*  HCT 28.5*  PLT 132*   ------------------------------------------------------------------------------------------------------------------  Chemistries   Recent Labs Lab 01/15/17 0720 01/15/17 1136    NA 162* 165*  K 2.7*  --   CL >130*  --   CO2 21*  --   GLUCOSE 200*  --   BUN 38*  --   CREATININE 1.42*  --   CALCIUM 8.2*  --   MG 2.3  --    ------------------------------------------------------------------------------------------------------------------  Cardiac Enzymes No results for input(s): TROPONINI in the last 168 hours. ------------------------------------------------------------------------------------------------------------------  RADIOLOGY:  No results found.  EKG:   Orders placed or performed during the hospital encounter of 01/14/17  . ED EKG  . ED EKG  . EKG 12-Lead  . EKG 12-Lead    ASSESSMENT AND PLAN:   61 year old male with past medical history significant for schizophrenia, bipolar, CVA with right-sided weakness, peripheral vascular disease was brought from group home secondary to abnormal labs. Noted to have hypernatremia.  #1 severe hypernatremia- could be freewater deficit. Sodium was 180 on admission. Started on D5 W, sodium up to 165. -Decrease the rate of fluids. Recheck sodium every 6 hours. -Patient is still alert. -Appreciate nephrology consult. -Also group home staff complaining of polyuria-strictly monitor urine output. Check serum and urine osmolalities. Questionable diabetes insipidus needs to be ruled out. -Patient is not in any lithium for his bipolar  #2 hypokalemia-being replaced aggressively  #3 history of schizophrenia-patient is from group home. No guardian. Continue home medications. Patient on Zoloft. Also on Keppra.  #4 history of CVA-with right-sided weakness. Continue aspirin, Plavix and statin.  #5 DVT prophylaxis-on Lovenox     All the records are reviewed and case discussed with Care Management/Social Workerr. Management plans discussed with the patient, family and they are in agreement.  CODE STATUS: Full Code  TOTAL  TIME TAKING CARE OF THIS PATIENT: 39 minutes.   POSSIBLE D/C IN 2 DAYS, DEPENDING ON  CLINICAL CONDITION.   Gladstone Lighter M.D on 01/15/2017 at 1:29 PM  Between 7am to 6pm - Pager - [phone]  After 6pm go to [email or web address] - password [email or web address] [facility]  Office  [phone]  CC: Primary care physician; Vista Mink, FNP

## 2017-01-16 DIAGNOSIS — E43 Unspecified severe protein-calorie malnutrition: Secondary | ICD-10-CM | POA: Insufficient documentation

## 2017-01-16 LAB — BASIC METABOLIC PANEL
Anion gap: 5 (ref 5–15)
BUN: 30 mg/dL — AB (ref 6–20)
CHLORIDE: 126 mmol/L — AB (ref 101–111)
CO2: 27 mmol/L (ref 22–32)
CREATININE: 1.24 mg/dL (ref 0.61–1.24)
Calcium: 8.1 mg/dL — ABNORMAL LOW (ref 8.9–10.3)
GFR calc Af Amer: >60 mL/min (ref >60)
GFR calc non Af Amer: >60 mL/min (ref >60)
Glucose, Bld: 190 mg/dL — ABNORMAL HIGH (ref 65–99)
Potassium: 3.5 mmol/L (ref 3.5–5.1)
SODIUM: 158 mmol/L — AB (ref 135–145)

## 2017-01-16 LAB — GLUCOSE, CAPILLARY
GLUCOSE-CAPILLARY: 201 mg/dL — AB (ref 65–99)
GLUCOSE-CAPILLARY: 124 mg/dL — AB (ref 65–99)
Glucose-Capillary: 148 mg/dL — ABNORMAL HIGH (ref 65–99)
Glucose-Capillary: 105 mg/dL — ABNORMAL HIGH (ref 65–99)

## 2017-01-16 LAB — SODIUM
SODIUM: 156 mmol/L — AB (ref 135–145)
Sodium: 152 mmol/L — ABNORMAL HIGH (ref 135–145)
Sodium: 151 mmol/L — ABNORMAL HIGH (ref 135–145)

## 2017-01-16 LAB — HEMOGLOBIN A1C
HEMOGLOBIN A1C: 5.2 % (ref 4.8–5.6)
MEAN PLASMA GLUCOSE: 103 mg/dL

## 2017-01-16 MED ORDER — ENOXAPARIN SODIUM 40 MG/0.4ML ~~LOC~~ SOLN
40.0000 mg | SUBCUTANEOUS | Status: DC
  Administered 2017-01-16 – 2017-01-19 (×4): 40 mg via SUBCUTANEOUS
  Filled 2017-01-16 (×4): qty 0.4

## 2017-01-16 NOTE — Progress Notes (Signed)
[place] at [place] NAME: [name]    MR#:  [id number]  DATE OF BIRTH:  04/24/1956  SUBJECTIVE:  CHIEF COMPLAINT:   Chief Complaint  Patient presents with  . Abnormal Lab   - Still sleepy this morning, sodium is 152. Other labs are pending  REVIEW OF SYSTEMS:  Review of Systems  Unable to perform ROS: Psychiatric disorder    DRUG ALLERGIES:  Not on File  VITALS:  Blood pressure (!) 98/47, pulse 80, temperature 98 F (36.7 C), temperature source Oral, resp. rate 15, height 5\' 7"  (1.702 m), weight 52.2 kg (115 lb), SpO2 97 %.  PHYSICAL EXAMINATION:  Physical Exam  GENERAL:  61 y.o.-year-old patient lying in the bed with no acute distress.  EYES: Pupils equal, round, reactive to light and accommodation. No scleral icterus. Extraocular muscles intact.  HEENT: Head atraumatic, normocephalic. Oropharynx and nasopharynx clear.  NECK:  Supple, no jugular venous distention. No thyroid enlargement, no tenderness.  LUNGS: Normal breath sounds bilaterally, no wheezing, rales,rhonchi or crepitation. No use of accessory muscles of respiration.  CARDIOVASCULAR: S1, S2 normal. No  rubs, or gallops. 3/6 systolic murmur present. ABDOMEN: Soft, nontender, nondistended. Bowel sounds present. No organomegaly or mass.  EXTREMITIES: No pedal edema, cyanosis, or clubbing. Atrophic lower extremities NEUROLOGIC: following some simple commands, 2/5 RLE, 2/5 RUE with contracted hand, 4/5 LUE and 3/5 LLE strength noted. Sensation is intact on left side PSYCHIATRIC: The patient is alert. Speech is not clear SKIN: No obvious rash, lesion, or ulcer.    LABORATORY PANEL:   CBC  Recent Labs Lab 01/14/17 1202  WBC 4.5  HGB 9.1*  HCT 28.5*  PLT 132*   ------------------------------------------------------------------------------------------------------------------  Chemistries   Recent Labs Lab 01/15/17 0720  01/15/17 1524  01/16/17 0511    NA 162*  < > 163*  < > 152*  K 2.7*  --  3.9  --   --   CL >130*  --  >130*  --   --   CO2 21*  --  25  --   --   GLUCOSE 200*  --  185*  --   --   BUN 38*  --  33*  --   --   CREATININE 1.42*  --  1.47*  --   --   CALCIUM 8.2*  --  8.6*  --   --   MG 2.3  --   --   --   --   < > = values in this interval not displayed. ------------------------------------------------------------------------------------------------------------------  Cardiac Enzymes No results for input(s): TROPONINI in the last 168 hours. ------------------------------------------------------------------------------------------------------------------  RADIOLOGY:  No results found.  EKG:   Orders placed or performed during the hospital encounter of 01/14/17  . ED EKG  . ED EKG  . EKG 12-Lead  . EKG 12-Lead    ASSESSMENT AND PLAN:   61 year old male with past medical history significant for schizophrenia, bipolar, CVA with right-sided weakness, peripheral vascular disease was brought from group home secondary to abnormal labs. Noted to have hypernatremia.  #1 severe hypernatremia- could be freewater deficit. Sodium was 180 on admission. Was on D5 W, -Sodium John to 152 today. Discontinue D5 water today -Appreciate nephrology consult. 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 -Patient is not on any lithium for his bipolar  #2 hypokalemia-replaced aggressively, follow up today. Labs are pending  #3  history of schizophrenia-patient is from group home. No guardian. Continue home medications. Patient on Zoloft. Also on Keppra.  #4 history of CVA-with right-sided weakness. Continue aspirin, Plavix and statin.  #5 DVT prophylaxis-on Lovenox   Once labs are better, plan to discharge back to his group home. Patient is wheelchair bound at baseline   All the records are reviewed and case discussed with Care  Management/Social Workerr. Management plans discussed with the patient, family and they are in agreement.  CODE STATUS: DO NOT RESUSCITATE  TOTAL TIME TAKING CARE OF THIS PATIENT: 37 minutes.   POSSIBLE D/C IN 2 DAYS, DEPENDING ON CLINICAL CONDITION.   Mileigh Tilley M.D on 01/16/2017 at 10:00 AM  Between 7am to 6pm - Pager - [phone]  After 6pm go to [email or web address] - password [email or web address] [facility]  Office  [phone]  CC: Primary care physician; Vista Mink, FNP

## 2017-01-16 NOTE — Progress Notes (Signed)
Subjective:  Patient is lethargic but able to answer a few  Yes/no questions Na corrected to 152 this morning   Current medications: Current Facility-Administered Medications  Medication Dose Route Frequency Provider Last Rate Last Dose  . acetaminophen (TYLENOL) tablet 650 mg  650 mg Oral Q6H PRN Fritzi Mandes, MD       Or  . acetaminophen (TYLENOL) suppository 650 mg  650 mg Rectal Q6H PRN Fritzi Mandes, MD      . aspirin EC tablet 81 mg  81 mg Oral Daily Fritzi Mandes, MD   81 mg at 01/16/17 0831  . atorvastatin (LIPITOR) tablet 10 mg  10 mg Oral Daily Fritzi Mandes, MD   10 mg at 01/15/17 1718  . clopidogrel (PLAVIX) tablet 75 mg  75 mg Oral Daily Fritzi Mandes, MD   75 mg at 01/16/17 0831  . docusate sodium (COLACE) capsule 100 mg  100 mg Oral BID Fritzi Mandes, MD   100 mg at 01/16/17 0831  . enoxaparin (LOVENOX) injection 40 mg  40 mg Subcutaneous Q24H Gladstone Lighter, MD      . feeding supplement (ENSURE ENLIVE) (ENSURE ENLIVE) liquid 237 mL  237 mL Oral BID BM Jaymir Struble, MD   237 mL at 01/15/17 1518  . insulin aspart (novoLOG) injection 0-5 Units  0-5 Units Subcutaneous QHS Fritzi Mandes, MD      . insulin aspart (novoLOG) injection 0-9 Units  0-9 Units Subcutaneous TID WC Fritzi Mandes, MD   3 Units at 01/16/17 0830  . ipratropium-albuterol (DUONEB) 0.5-2.5 (3) MG/3ML nebulizer solution 3 mL  3 mL Nebulization Q4H PRN Fritzi Mandes, MD      . levETIRAcetam (KEPPRA) 100 MG/ML solution 500 mg  500 mg Oral BID Fritzi Mandes, MD   500 mg at 01/16/17 0831  . LORazepam (ATIVAN) tablet 0.5 mg  0.5 mg Oral BID PRN Fritzi Mandes, MD      . midodrine (PROAMATINE) tablet 5 mg  5 mg Oral BID WC Fritzi Mandes, MD   5 mg at 01/16/17 0831  . ondansetron (ZOFRAN) tablet 4 mg  4 mg Oral Q6H PRN Fritzi Mandes, MD       Or  . ondansetron (ZOFRAN) injection 4 mg  4 mg Intravenous Q6H PRN Fritzi Mandes, MD      . senna-docusate (Senokot-S) tablet 1 tablet  1 tablet Oral QHS PRN Fritzi Mandes, MD      . sertraline (ZOLOFT) tablet 50 mg   50 mg Oral QHS Fritzi Mandes, MD   50 mg at 01/15/17 2131  . Vitamin D (Ergocalciferol) (DRISDOL) capsule 50,000 Units  50,000 Units Oral Q7 days Gladstone Lighter, MD   50,000 Units at 01/15/17 1343        Vital Signs: Blood pressure (!) 98/47, pulse 80, temperature 98 F (36.7 C), temperature source Oral, resp. rate 15, height 5\' 7"  (1.702 m), weight 52.2 kg (115 lb), SpO2 97 %.   Intake/Output Summary (Last 24 hours) at 01/16/17 1439 Last data filed at 01/16/17 1004  Gross per 24 hour  Intake          2573.75 ml  Output              100 ml  Net          2473.75 ml    Weight trends: Filed Weights   01/14/17 1117  Weight: 52.2 kg (115 lb)    Physical Exam: General:  NAD, sitting up in bed  HEENT Dry oral mucus membranes  Neck:  supple  Lungs: Clear b/l, normal effort  Heart::  regular, no rub  Abdomen: Soft, NT  Extremities:  no edema, feet in soft boot support  Neurologic: Lethargic, followed few simple commands, not oreinted  Skin: decreased turgor             Lab results: Basic Metabolic Panel:  Recent Labs Lab 01/15/17 0720  01/15/17 1524 01/15/17 1729 01/16/17 0030 01/16/17 0511  NA 162*  < > 163* 161* 158*  156* 152*  K 2.7*  --  3.9  --  3.5  --   CL >130*  --  >130*  --  126*  --   CO2 21*  --  25  --  27  --   GLUCOSE 200*  --  185*  --  190*  --   BUN 38*  --  33*  --  30*  --   CREATININE 1.42*  --  1.47*  --  1.24  --   CALCIUM 8.2*  --  8.6*  --  8.1*  --   MG 2.3  --   --   --   --   --   < > = values in this interval not displayed.  Liver Function Tests: No results for input(s): AST, ALT, ALKPHOS, BILITOT, PROT, ALBUMIN in the last 168 hours. No results for input(s): LIPASE, AMYLASE in the last 168 hours. No results for input(s): AMMONIA in the last 168 hours.  CBC:  Recent Labs Lab 01/14/17 1202  WBC 4.5  HGB 9.1*  HCT 28.5*  MCV 98.5  PLT 132*    Cardiac Enzymes: No results for input(s): CKTOTAL, TROPONINI in the last 168  hours.  BNP: Invalid input(s): POCBNP  CBG:  Recent Labs Lab 01/15/17 1206 01/15/17 1649 01/15/17 2059 01/16/17 0751 01/16/17 1140  GLUCAP 144* 196* 144* 201* 148*    Microbiology: Recent Results (from the past 720 hour(s))  MRSA PCR Screening     Status: None   Collection Time: 01/14/17  6:00 PM  Result Value Ref Range Status   MRSA by PCR NEGATIVE NEGATIVE Final    Comment:        The GeneXpert MRSA Assay (FDA approved for NASAL specimens only), is one component of a comprehensive MRSA colonization surveillance program. It is not intended to diagnose MRSA infection nor to guide or monitor treatment for MRSA infections.      Coagulation Studies: No results for input(s): LABPROT, INR in the last 72 hours.  Urinalysis:  Recent Labs  01/15/17 1712  COLORURINE YELLOW*  LABSPEC 1.012  PHURINE 5.0  GLUCOSEU NEGATIVE  HGBUR NEGATIVE  BILIRUBINUR NEGATIVE  KETONESUR NEGATIVE  PROTEINUR NEGATIVE  NITRITE NEGATIVE  LEUKOCYTESUR NEGATIVE        Imaging: No results found.   Assessment & Plan: Pt is a 61 y.o. AA male with medical problems of schizophrenia, CVA, PVD, HTN , who was admitted to [facility] on 01/14/2017 for evaluation of dehydration.   1. Hypernatremia 2. Hypokalemia 3. Dehydration  Plan D/c as correction is greater than goal Goal of correction to approx 150  By tomorrow morning (~ 10 meq/24 hrs) Agree with iv Kcl replacement Agree with quantification of UOP as there is some reports of polyuria- difficult to do as patient is incontinent Na q 6 hrs

## 2017-01-16 NOTE — Progress Notes (Signed)
Shift assessment completed at 0820. Pt made one word responses to this writer's questions, appears tired and closes his eyes between interventions. Skin is warm and dry. Pt is on room air, lungs are clear bilat, Telebox in place with Sr noted. Abdomen is soft, bs heard. Pt has incontinence brief on,ppp, bilat blue boots on. piv #22 intact to l arm, ivf dc'd per dr.'s orders. Attempt made to feed pt with minimal success, but pt has cooperated with meds. Since assessment, pt has been incontinent of urine several times, and pt was given an opportunity to feed himself, but pt was able to do little, has been resting with eyes closed for much of the day. Pt at one point in the after noon was combative with care. Srx2, will monitor.

## 2017-01-16 NOTE — Progress Notes (Signed)
PT Cancellation Note  Patient Details Name: [name] MRN: [id number] DOB: 04-08-1956   Cancelled Treatment:    Reason Eval/Treat Not Completed: Other (comment). Consult received and chart reviewed. Evaluation attempted, however pt lethargic, unable to follow commands. Unable to state name or year. Attempted to perform MMT, however unable to participate. R LE fell off bed, assisted for correct placement and bed rail raised. Per chart review, pt is lift at baseline and uses WC. Pt is not appropriate for therapy in this setting. Discussed with RN. Will complete current orders at this time.   Temara Lanum 01/16/2017, 9:09 AM  Greggory Stallion, PT, DPT [phone]

## 2017-01-16 NOTE — Progress Notes (Signed)
Clinical Education officer, museum (CSW) contacted [facility] group home RN Mariann Laster and made her aware that patient will likely D/C in 2 days per MD note. Per Mariann Laster patient can return to group home when stable via EMS. CSW will continue to follow and assist as needed.   [employer], LCSW [phone]

## 2017-01-16 NOTE — Plan of Care (Signed)
Problem: Spiritual Needs Goal: Ability to function at adequate level Outcome: Progressing Pt has remains free of falls/injury this shift Lab values have improved.

## 2017-01-17 LAB — GLUCOSE, CAPILLARY
GLUCOSE-CAPILLARY: 127 mg/dL — AB (ref 65–99)
Glucose-Capillary: 103 mg/dL — ABNORMAL HIGH (ref 65–99)
Glucose-Capillary: 125 mg/dL — ABNORMAL HIGH (ref 65–99)

## 2017-01-17 LAB — BASIC METABOLIC PANEL
ANION GAP: 4 — AB (ref 5–15)
BUN: 20 mg/dL (ref 6–20)
CO2: 24 mmol/L (ref 22–32)
Calcium: 8.5 mg/dL — ABNORMAL LOW (ref 8.9–10.3)
Chloride: 124 mmol/L — ABNORMAL HIGH (ref 101–111)
Creatinine, Ser: 1.22 mg/dL (ref 0.61–1.24)
GFR calc Af Amer: >60 mL/min (ref >60)
Glucose, Bld: 141 mg/dL — ABNORMAL HIGH (ref 65–99)
POTASSIUM: 3.5 mmol/L (ref 3.5–5.1)
SODIUM: 152 mmol/L — AB (ref 135–145)

## 2017-01-17 LAB — SODIUM
SODIUM: 151 mmol/L — AB (ref 135–145)
Sodium: 152 mmol/L — ABNORMAL HIGH (ref 135–145)

## 2017-01-17 MED ORDER — POTASSIUM CL IN DEXTROSE 5% 20 MEQ/L IV SOLN
20.0000 meq | INTRAVENOUS | Status: AC
  Administered 2017-01-17 – 2017-01-18 (×2): 20 meq via INTRAVENOUS
  Filled 2017-01-17 (×4): qty 1000

## 2017-01-17 NOTE — Progress Notes (Signed)
Subjective:  Patient is lethargic  Na 152 this morning Did not interact much today   Current medications: Current Facility-Administered Medications  Medication Dose Route Frequency Provider Last Rate Last Dose  . acetaminophen (TYLENOL) tablet 650 mg  650 mg Oral Q6H PRN Fritzi Mandes, MD       Or  . acetaminophen (TYLENOL) suppository 650 mg  650 mg Rectal Q6H PRN Fritzi Mandes, MD      . aspirin EC tablet 81 mg  81 mg Oral Daily Fritzi Mandes, MD   81 mg at 01/17/17 1050  . atorvastatin (LIPITOR) tablet 10 mg  10 mg Oral Daily Fritzi Mandes, MD   10 mg at 01/16/17 1807  . clopidogrel (PLAVIX) tablet 75 mg  75 mg Oral Daily Fritzi Mandes, MD   75 mg at 01/17/17 1050  . dextrose 5 % with KCl 20 mEq / L  infusion  20 mEq Intravenous Continuous Gladstone Lighter, MD      . docusate sodium (COLACE) capsule 100 mg  100 mg Oral BID Fritzi Mandes, MD   100 mg at 01/17/17 1050  . enoxaparin (LOVENOX) injection 40 mg  40 mg Subcutaneous Q24H Gladstone Lighter, MD   40 mg at 01/16/17 2220  . feeding supplement (ENSURE ENLIVE) (ENSURE ENLIVE) liquid 237 mL  237 mL Oral BID BM Newman Waren, MD   237 mL at 01/17/17 1050  . insulin aspart (novoLOG) injection 0-5 Units  0-5 Units Subcutaneous QHS Fritzi Mandes, MD      . insulin aspart (novoLOG) injection 0-9 Units  0-9 Units Subcutaneous TID WC Fritzi Mandes, MD   1 Units at 01/17/17 1049  . ipratropium-albuterol (DUONEB) 0.5-2.5 (3) MG/3ML nebulizer solution 3 mL  3 mL Nebulization Q4H PRN Fritzi Mandes, MD      . levETIRAcetam (KEPPRA) 100 MG/ML solution 500 mg  500 mg Oral BID Fritzi Mandes, MD   500 mg at 01/17/17 1050  . LORazepam (ATIVAN) tablet 0.5 mg  0.5 mg Oral BID PRN Fritzi Mandes, MD      . midodrine (PROAMATINE) tablet 5 mg  5 mg Oral BID WC Fritzi Mandes, MD   5 mg at 01/17/17 1050  . ondansetron (ZOFRAN) tablet 4 mg  4 mg Oral Q6H PRN Fritzi Mandes, MD       Or  . ondansetron (ZOFRAN) injection 4 mg  4 mg Intravenous Q6H PRN Fritzi Mandes, MD      . senna-docusate  (Senokot-S) tablet 1 tablet  1 tablet Oral QHS PRN Fritzi Mandes, MD      . sertraline (ZOLOFT) tablet 50 mg  50 mg Oral QHS Fritzi Mandes, MD   50 mg at 01/16/17 2220  . Vitamin D (Ergocalciferol) (DRISDOL) capsule 50,000 Units  50,000 Units Oral Q7 days Gladstone Lighter, MD   50,000 Units at 01/15/17 1343        Vital Signs: Blood pressure (!) 85/52, pulse (!) 58, temperature 97.3 F (36.3 C), temperature source Axillary, resp. rate 18, height 5\' 7"  (1.702 m), weight 119.5 kg (263 lb 7.2 oz), SpO2 100 %.   Intake/Output Summary (Last 24 hours) at 01/17/17 1130 Last data filed at 01/16/17 2115  Gross per 24 hour  Intake               50 ml  Output                0 ml  Net  50 ml    Weight trends: Filed Weights   01/14/17 1117 01/17/17 1019 01/17/17 1022  Weight: 52.2 kg (115 lb) 54.1 kg (119 lb 5 oz) 119.5 kg (263 lb 7.2 oz)    Physical Exam: General:  NAD, laying in bed on his rt side  HEENT Dry oral mucus membranes  Neck:  supple  Lungs: Clear b/l, normal effort  Heart::  regular, no rub  Abdomen: Soft, NT  Extremities:  no edema, feet in soft boot support, extremity contractures  Neurologic: Lethargic,opens eyes to voice, did not interact today  Skin: decreased turgor             Lab results: Basic Metabolic Panel:  Recent Labs Lab 01/15/17 0720  01/15/17 1524  01/16/17 0030 01/16/17 0511 01/16/17 2307 01/17/17 0446  NA 162*  < > 163*  < > 158*  156* 152* 151* 152*  K 2.7*  --  3.9  --  3.5  --   --  3.5  CL >130*  --  >130*  --  126*  --   --  124*  CO2 21*  --  25  --  27  --   --  24  GLUCOSE 200*  --  185*  --  190*  --   --  141*  BUN 38*  --  33*  --  30*  --   --  20  CREATININE 1.42*  --  1.47*  --  1.24  --   --  1.22  CALCIUM 8.2*  --  8.6*  --  8.1*  --   --  8.5*  MG 2.3  --   --   --   --   --   --   --   < > = values in this interval not displayed.  Liver Function Tests: No results for input(s): AST, ALT, ALKPHOS, BILITOT,  PROT, ALBUMIN in the last 168 hours. No results for input(s): LIPASE, AMYLASE in the last 168 hours. No results for input(s): AMMONIA in the last 168 hours.  CBC:  Recent Labs Lab 01/14/17 1202  WBC 4.5  HGB 9.1*  HCT 28.5*  MCV 98.5  PLT 132*    Cardiac Enzymes: No results for input(s): CKTOTAL, TROPONINI in the last 168 hours.  BNP: Invalid input(s): POCBNP  CBG:  Recent Labs Lab 01/15/17 2059 01/16/17 0751 01/16/17 1140 01/16/17 1642 01/16/17 2123  GLUCAP 144* 201* 148* 105* 124*    Microbiology: Recent Results (from the past 720 hour(s))  MRSA PCR Screening     Status: None   Collection Time: 01/14/17  6:00 PM  Result Value Ref Range Status   MRSA by PCR NEGATIVE NEGATIVE Final    Comment:        The GeneXpert MRSA Assay (FDA approved for NASAL specimens only), is one component of a comprehensive MRSA colonization surveillance program. It is not intended to diagnose MRSA infection nor to guide or monitor treatment for MRSA infections.      Coagulation Studies: No results for input(s): LABPROT, INR in the last 72 hours.  Urinalysis:  Recent Labs  01/15/17 1712  COLORURINE YELLOW*  LABSPEC 1.012  PHURINE 5.0  GLUCOSEU NEGATIVE  HGBUR NEGATIVE  BILIRUBINUR NEGATIVE  KETONESUR NEGATIVE  PROTEINUR NEGATIVE  NITRITE NEGATIVE  LEUKOCYTESUR NEGATIVE        Imaging: No results found.   Assessment & Plan: Pt is a 61 y.o. AA male with medical problems of schizophrenia, CVA, PVD, HTN ,  who was admitted to [facility] on 01/14/2017 for evaluation of dehydration.   1. Hypernatremia 2. Hypokalemia 3. Dehydration  Plan D/c as correction is greater than goal Goal of correction to approx 140  By tomorrow morning (~ 10 meq/24 hrs) Agree with iv Kcl replacement May restart D5w Na q 6 hrs

## 2017-01-17 NOTE — Progress Notes (Addendum)
Insulin given at 10:50 am of 1 unit  results from 01/16/17 bg 124,  blood sugar was not taking at 8am notified  and spoke with doctor [facility] about 1 unit given,  okay not to give the lunch dose of 1unit due to  getting 1 at 10:50am. blood sugar taking at lunch 127 taking. Will monitor patient. Patient is eating his lunch.

## 2017-01-17 NOTE — Progress Notes (Signed)
Pt doing well ate, majority of his lunch, alert and orient  When calling his name, no adverse reaction blood sugar taking 103 no coverage given.

## 2017-01-17 NOTE — Progress Notes (Signed)
[place] at [place] NAME: [name]    MR#:  [id number]  DATE OF BIRTH:  July 07, 1956  SUBJECTIVE:  CHIEF COMPLAINT:   Chief Complaint  Patient presents with  . Abnormal Lab   - lethargic this morning, sodium remains at 152  REVIEW OF SYSTEMS:  Review of Systems  Unable to perform ROS: Psychiatric disorder    DRUG ALLERGIES:  Not on File  VITALS:  Blood pressure (!) 85/52, pulse (!) 58, temperature 97.3 F (36.3 C), temperature source Axillary, resp. rate 18, height 5\' 7"  (1.702 m), weight 119.5 kg (263 lb 7.2 oz), SpO2 100 %.  PHYSICAL EXAMINATION:  Physical Exam  GENERAL:  61 y.o.-year-old patient lying in the bed with no acute distress.  EYES: Pupils equal, round, reactive to light and accommodation. No scleral icterus. Extraocular muscles intact.  HEENT: Head atraumatic, normocephalic. Oropharynx and nasopharynx clear.  NECK:  Supple, no jugular venous distention. No thyroid enlargement, no tenderness.  LUNGS: Normal breath sounds bilaterally, no wheezing, rales,rhonchi or crepitation. No use of accessory muscles of respiration.  CARDIOVASCULAR: S1, S2 normal. No  rubs, or gallops. 3/6 systolic murmur present. ABDOMEN: Soft, nontender, nondistended. Bowel sounds present. No organomegaly or mass.  EXTREMITIES: No pedal edema, cyanosis, or clubbing. Atrophic lower extremities NEUROLOGIC: following some simple commands, 2/5 RLE, 2/5 RUE with contracted hand, 4/5 LUE and 3/5 LLE strength noted. Sensation is intact on left side PSYCHIATRIC: The patient is alert. Speech is not clear SKIN: No obvious rash, lesion, or ulcer.    LABORATORY PANEL:   CBC  Recent Labs Lab 01/14/17 1202  WBC 4.5  HGB 9.1*  HCT 28.5*  PLT 132*   ------------------------------------------------------------------------------------------------------------------  Chemistries   Recent Labs Lab 01/15/17 0720  01/17/17 0446  NA 162*  < >  152*  K 2.7*  < > 3.5  CL >130*  < > 124*  CO2 21*  < > 24  GLUCOSE 200*  < > 141*  BUN 38*  < > 20  CREATININE 1.42*  < > 1.22  CALCIUM 8.2*  < > 8.5*  MG 2.3  --   --   < > = values in this interval not displayed. ------------------------------------------------------------------------------------------------------------------  Cardiac Enzymes No results for input(s): TROPONINI in the last 168 hours. ------------------------------------------------------------------------------------------------------------------  RADIOLOGY:  No results found.  EKG:   Orders placed or performed during the hospital encounter of 01/14/17  . ED EKG  . ED EKG  . EKG 12-Lead  . EKG 12-Lead    ASSESSMENT AND PLAN:   61 year old male with past medical history significant for schizophrenia, bipolar, CVA with right-sided weakness, peripheral vascular disease was brought from group home secondary to abnormal labs. Noted to have hypernatremia.  #1 severe hypernatremia- could be freewater deficit. Sodium was 180 on admission. Was on D5 W, -Sodium down to 152 today. Restarted D5W today -Appreciate nephrology consult. 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 -Patient is not on any lithium for his bipolar  #2 hypokalemia-replaced, follow up.   #3 history of schizophrenia-patient is from group home. No guardian. Continue home medications. Patient on Zoloft. Also on Keppra.  #4 history of CVA-with right-sided weakness. Continue aspirin, Plavix and statin.  #5 DVT prophylaxis-on Lovenox   Once labs are better, plan to discharge back to his group home. Patient is wheelchair bound at baseline   All the records are reviewed  and case discussed with Care Management/Social Workerr. Management plans discussed with the patient, family and they are in agreement.  CODE STATUS: DO NOT RESUSCITATE  TOTAL TIME TAKING  CARE OF THIS PATIENT: 35 minutes.   POSSIBLE D/C IN 2 DAYS, DEPENDING ON CLINICAL CONDITION.   Gladstone Lighter M.D on 01/17/2017 at 12:27 PM  Between 7am to 6pm - Pager - [phone]  After 6pm go to [email or web address] - password [email or web address] [facility]  Office  [phone]  CC: Primary care physician; Vista Mink, FNP

## 2017-01-18 LAB — CBC
HCT: 22.5 % — ABNORMAL LOW (ref 40.0–52.0)
Hemoglobin: 7.5 g/dL — ABNORMAL LOW (ref 13.0–18.0)
MCH: 32.3 pg (ref 26.0–34.0)
MCHC: 33.5 g/dL (ref 32.0–36.0)
MCV: 96.5 fL (ref 80.0–100.0)
PLATELETS: 93 10*3/uL — AB (ref 150–440)
RBC: 2.33 MIL/uL — ABNORMAL LOW (ref 4.40–5.90)
RDW: 14.3 % (ref 11.5–14.5)
WBC: 4 10*3/uL (ref 3.8–10.6)

## 2017-01-18 LAB — GLUCOSE, CAPILLARY
GLUCOSE-CAPILLARY: 131 mg/dL — AB (ref 65–99)
GLUCOSE-CAPILLARY: 123 mg/dL — AB (ref 65–99)
Glucose-Capillary: 164 mg/dL — ABNORMAL HIGH (ref 65–99)
Glucose-Capillary: 109 mg/dL — ABNORMAL HIGH (ref 65–99)

## 2017-01-18 LAB — BASIC METABOLIC PANEL
ANION GAP: 2 — AB (ref 5–15)
BUN: 15 mg/dL (ref 6–20)
CO2: 26 mmol/L (ref 22–32)
Calcium: 8.4 mg/dL — ABNORMAL LOW (ref 8.9–10.3)
Chloride: 117 mmol/L — ABNORMAL HIGH (ref 101–111)
Creatinine, Ser: 1.01 mg/dL (ref 0.61–1.24)
GFR calc Af Amer: >60 mL/min (ref >60)
Glucose, Bld: 137 mg/dL — ABNORMAL HIGH (ref 65–99)
POTASSIUM: 4.1 mmol/L (ref 3.5–5.1)
SODIUM: 145 mmol/L (ref 135–145)

## 2017-01-18 LAB — SODIUM: SODIUM: 146 mmol/L — AB (ref 135–145)

## 2017-01-18 MED ORDER — SODIUM CHLORIDE 0.9 % IV SOLN
INTRAVENOUS | Status: AC
Start: 2017-01-18 — End: 2017-01-19
  Administered 2017-01-18: 21:00:00 via INTRAVENOUS

## 2017-01-18 MED ORDER — FERROUS SULFATE 325 (65 FE) MG PO TABS
325.0000 mg | ORAL_TABLET | Freq: Two times a day (BID) | ORAL | Status: DC
  Administered 2017-01-18 – 2017-01-20 (×3): 325 mg via ORAL
  Filled 2017-01-18 (×4): qty 1

## 2017-01-18 NOTE — Progress Notes (Signed)
Subjective  Patient is more awake today Able to follow few simple commands Na to 145 this AM   Current medications: Current Facility-Administered Medications  Medication Dose Route Frequency Provider Last Rate Last Dose  . acetaminophen (TYLENOL) tablet 650 mg  650 mg Oral Q6H PRN Fritzi Mandes, MD       Or  . acetaminophen (TYLENOL) suppository 650 mg  650 mg Rectal Q6H PRN Fritzi Mandes, MD      . aspirin EC tablet 81 mg  81 mg Oral Daily Fritzi Mandes, MD   81 mg at 01/18/17 0827  . atorvastatin (LIPITOR) tablet 10 mg  10 mg Oral Daily Fritzi Mandes, MD   10 mg at 01/17/17 1856  . clopidogrel (PLAVIX) tablet 75 mg  75 mg Oral Daily Fritzi Mandes, MD   75 mg at 01/18/17 0826  . dextrose 5 % with KCl 20 mEq / L  infusion  20 mEq Intravenous Continuous Gladstone Lighter, MD 60 mL/hr at 01/18/17 0631 20 mEq at 01/18/17 0631  . docusate sodium (COLACE) capsule 100 mg  100 mg Oral BID Fritzi Mandes, MD   100 mg at 01/18/17 0827  . enoxaparin (LOVENOX) injection 40 mg  40 mg Subcutaneous Q24H Gladstone Lighter, MD   40 mg at 01/17/17 2110  . feeding supplement (ENSURE ENLIVE) (ENSURE ENLIVE) liquid 237 mL  237 mL Oral BID BM Phinley Schall, MD   237 mL at 01/18/17 0843  . ferrous sulfate tablet 325 mg  325 mg Oral BID WC Gladstone Lighter, MD      . insulin aspart (novoLOG) injection 0-5 Units  0-5 Units Subcutaneous QHS Fritzi Mandes, MD      . insulin aspart (novoLOG) injection 0-9 Units  0-9 Units Subcutaneous TID WC Fritzi Mandes, MD   1 Units at 01/18/17 0086  . ipratropium-albuterol (DUONEB) 0.5-2.5 (3) MG/3ML nebulizer solution 3 mL  3 mL Nebulization Q4H PRN Fritzi Mandes, MD      . levETIRAcetam (KEPPRA) 100 MG/ML solution 500 mg  500 mg Oral BID Fritzi Mandes, MD   500 mg at 01/18/17 0935  . LORazepam (ATIVAN) tablet 0.5 mg  0.5 mg Oral BID PRN Fritzi Mandes, MD   0.5 mg at 01/18/17 0656  . midodrine (PROAMATINE) tablet 5 mg  5 mg Oral BID WC Fritzi Mandes, MD   5 mg at 01/18/17 0827  . ondansetron (ZOFRAN) tablet 4 mg   4 mg Oral Q6H PRN Fritzi Mandes, MD       Or  . ondansetron (ZOFRAN) injection 4 mg  4 mg Intravenous Q6H PRN Fritzi Mandes, MD      . senna-docusate (Senokot-S) tablet 1 tablet  1 tablet Oral QHS PRN Fritzi Mandes, MD      . sertraline (ZOLOFT) tablet 50 mg  50 mg Oral QHS Fritzi Mandes, MD   50 mg at 01/17/17 2110  . Vitamin D (Ergocalciferol) (DRISDOL) capsule 50,000 Units  50,000 Units Oral Q7 days Gladstone Lighter, MD   50,000 Units at 01/15/17 1343        Vital Signs: Blood pressure (!) 95/57, pulse 72, temperature 98.6 F (37 C), temperature source Oral, resp. rate 18, height 5\' 7"  (1.702 m), weight 119.5 kg (263 lb 7.2 oz), SpO2 100 %.   Intake/Output Summary (Last 24 hours) at 01/18/17 1147 Last data filed at 01/18/17 0800  Gross per 24 hour  Intake             1466 ml  Output  0 ml  Net             1466 ml    Weight trends: Filed Weights   01/14/17 1117 01/17/17 1019 01/17/17 1022  Weight: 52.2 kg (115 lb) 54.1 kg (119 lb 5 oz) 119.5 kg (263 lb 7.2 oz)    Physical Exam: General:  NAD, laying in bed on his rt side  HEENT Dry oral mucus membranes  Neck:  supple  Lungs: Clear b/l, normal effort  Heart::  regular, no rub  Abdomen: Soft, NT  Extremities:  no edema, feet in soft boot support, extremity contractures  Neurologic: Able to follow few simple commands, answers with grunting sounds - not clear  Skin: decreased turgor             Lab results: Basic Metabolic Panel:  Recent Labs Lab 01/15/17 0720  01/16/17 0030  01/17/17 0446  01/17/17 1837 01/18/17 0059 01/18/17 0829  NA 162*  < > 158*  156*  < > 152*  < > 152* 146* 145  K 2.7*  < > 3.5  --  3.5  --   --   --  4.1  CL >130*  < > 126*  --  124*  --   --   --  117*  CO2 21*  < > 27  --  24  --   --   --  26  GLUCOSE 200*  < > 190*  --  141*  --   --   --  137*  BUN 38*  < > 30*  --  20  --   --   --  15  CREATININE 1.42*  < > 1.24  --  1.22  --   --   --  1.01  CALCIUM 8.2*  < > 8.1*  --   8.5*  --   --   --  8.4*  MG 2.3  --   --   --   --   --   --   --   --   < > = values in this interval not displayed.  Liver Function Tests: No results for input(s): AST, ALT, ALKPHOS, BILITOT, PROT, ALBUMIN in the last 168 hours. No results for input(s): LIPASE, AMYLASE in the last 168 hours. No results for input(s): AMMONIA in the last 168 hours.  CBC:  Recent Labs Lab 01/14/17 1202 01/18/17 0829  WBC 4.5 4.0  HGB 9.1* 7.5*  HCT 28.5* 22.5*  MCV 98.5 96.5  PLT 132* 93*    Cardiac Enzymes: No results for input(s): CKTOTAL, TROPONINI in the last 168 hours.  BNP: Invalid input(s): POCBNP  CBG:  Recent Labs Lab 01/16/17 2123 01/17/17 1347 01/17/17 1631 01/17/17 2128 01/18/17 0731  GLUCAP 124* 127* 103* 125* 131*    Microbiology: Recent Results (from the past 720 hour(s))  MRSA PCR Screening     Status: None   Collection Time: 01/14/17  6:00 PM  Result Value Ref Range Status   MRSA by PCR NEGATIVE NEGATIVE Final    Comment:        The GeneXpert MRSA Assay (FDA approved for NASAL specimens only), is one component of a comprehensive MRSA colonization surveillance program. It is not intended to diagnose MRSA infection nor to guide or monitor treatment for MRSA infections.      Coagulation Studies: No results for input(s): LABPROT, INR in the last 72 hours.  Urinalysis:  Recent Labs  01/15/17 1712  COLORURINE YELLOW*  LABSPEC 1.012  PHURINE 5.0  GLUCOSEU NEGATIVE  HGBUR NEGATIVE  BILIRUBINUR NEGATIVE  KETONESUR NEGATIVE  PROTEINUR NEGATIVE  NITRITE NEGATIVE  LEUKOCYTESUR NEGATIVE        Imaging: No results found.   Assessment & Plan: Pt is a 61 y.o. AA male with medical problems of schizophrenia, CVA, PVD, HTN , who was admitted to [facility] on 01/14/2017 for evaluation of dehydration.   1. Hypernatremia 2. Hypokalemia 3. Dehydration  Plan Na correction now at goal May d/c d5w later today to see if Na remains corrected tomorrow AM with  oral intake alone

## 2017-01-18 NOTE — Progress Notes (Addendum)
[place] at [place] NAME: [name]    MR#:  [id number]  DATE OF BIRTH:  Mar 09, 1956  SUBJECTIVE:  CHIEF COMPLAINT:   Chief Complaint  Patient presents with  . Abnormal Lab   - more alert and communicating today - sodium from midnight around 146, awaiting labs from this morning  REVIEW OF SYSTEMS:  Review of Systems  Unable to perform ROS: Psychiatric disorder    DRUG ALLERGIES:  Not on File  VITALS:  Blood pressure 95/65, pulse 65, temperature 97.5 F (36.4 C), temperature source Oral, resp. rate 16, height 5\' 7"  (1.702 m), weight 119.5 kg (263 lb 7.2 oz), SpO2 100 %.  PHYSICAL EXAMINATION:  Physical Exam  GENERAL:  61 y.o.-year-old patient lying in the bed with no acute distress.  EYES: Pupils equal, round, reactive to light and accommodation. No scleral icterus. Extraocular muscles intact.  HEENT: Head atraumatic, normocephalic. Oropharynx and nasopharynx clear.  NECK:  Supple, no jugular venous distention. No thyroid enlargement, no tenderness.  LUNGS: Normal breath sounds bilaterally, no wheezing, rales,rhonchi or crepitation. No use of accessory muscles of respiration.  CARDIOVASCULAR: S1, S2 normal. No  rubs, or gallops. 3/6 systolic murmur present. ABDOMEN: Soft, nontender, nondistended. Bowel sounds present. No organomegaly or mass.  EXTREMITIES: No pedal edema, cyanosis, or clubbing. Atrophic lower extremities NEUROLOGIC: following some simple commands, 2/5 RLE, 2/5 RUE with contracted hand, 4/5 LUE and 3/5 LLE strength noted. Sensation is intact on left side PSYCHIATRIC: The patient is more alert. SKIN: No obvious rash, lesion, or ulcer.    LABORATORY PANEL:   CBC  Recent Labs Lab 01/14/17 1202  WBC 4.5  HGB 9.1*  HCT 28.5*  PLT 132*   ------------------------------------------------------------------------------------------------------------------  Chemistries   Recent Labs Lab 01/15/17 0720   01/17/17 0446  01/18/17 0059  NA 162*  < > 152*  < > 146*  K 2.7*  < > 3.5  --   --   CL >130*  < > 124*  --   --   CO2 21*  < > 24  --   --   GLUCOSE 200*  < > 141*  --   --   BUN 38*  < > 20  --   --   CREATININE 1.42*  < > 1.22  --   --   CALCIUM 8.2*  < > 8.5*  --   --   MG 2.3  --   --   --   --   < > = values in this interval not displayed. ------------------------------------------------------------------------------------------------------------------  Cardiac Enzymes No results for input(s): TROPONINI in the last 168 hours. ------------------------------------------------------------------------------------------------------------------  RADIOLOGY:  No results found.  EKG:   Orders placed or performed during the hospital encounter of 01/14/17  . ED EKG  . ED EKG  . EKG 12-Lead  . EKG 12-Lead    ASSESSMENT AND PLAN:   61 year old male with past medical history significant for schizophrenia, bipolar, CVA with right-sided weakness, peripheral vascular disease was brought from group home secondary to abnormal labs. Noted to have hypernatremia.  #1 severe hypernatremia- could be freewater deficit. Sodium was 180 on admission. Was on D5 W, -Sodium down to 146 today. On d5W- after labs from today- likely d/c fluids -Appreciate nephrology consult. 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 -Patient is not on any lithium for his bipolar - more alert today  #  2 anemia- of chronic disease likely, patient has chronic anemia based on his labs from 2017 Baseline hb after fluids always staying around 7 - the drop since admission is likely dilutional. No indication for transfusion if hb>7.   #3 history of schizophrenia-patient is from group home. No guardian. Continue home medications. Patient on Zoloft. Also on Keppra.  #4 history of CVA-with right-sided weakness. Continue aspirin, Plavix  and statin.  #5 DVT prophylaxis-on Lovenox   Once labs are better, plan to discharge back to his group home. Patient is wheelchair bound at baseline   All the records are reviewed and case discussed with Care Management/Social Workerr. Management plans discussed with the patient, family and they are in agreement.  CODE STATUS: DO NOT RESUSCITATE  TOTAL TIME TAKING CARE OF THIS PATIENT: 33 minutes.   POSSIBLE D/C TOMORROW, DEPENDING ON CLINICAL CONDITION.   Gladstone Lighter M.D on 01/18/2017 at 8:10 AM  Between 7am to 6pm - Pager - [phone]  After 6pm go to [email or web address] - password [email or web address] [facility]  Office  [phone]  CC: Primary care physician; Vista Mink, FNP

## 2017-01-19 LAB — BASIC METABOLIC PANEL
Anion gap: 3 — ABNORMAL LOW (ref 5–15)
BUN: 18 mg/dL (ref 6–20)
CO2: 26 mmol/L (ref 22–32)
CREATININE: 1.01 mg/dL (ref 0.61–1.24)
Calcium: 8 mg/dL — ABNORMAL LOW (ref 8.9–10.3)
Chloride: 113 mmol/L — ABNORMAL HIGH (ref 101–111)
Glucose, Bld: 122 mg/dL — ABNORMAL HIGH (ref 65–99)
POTASSIUM: 4.4 mmol/L (ref 3.5–5.1)
Sodium: 142 mmol/L (ref 135–145)

## 2017-01-19 LAB — GLUCOSE, CAPILLARY
GLUCOSE-CAPILLARY: 136 mg/dL — AB (ref 65–99)
GLUCOSE-CAPILLARY: 141 mg/dL — AB (ref 65–99)
Glucose-Capillary: 108 mg/dL — ABNORMAL HIGH (ref 65–99)
Glucose-Capillary: 142 mg/dL — ABNORMAL HIGH (ref 65–99)

## 2017-01-19 MED ORDER — DOCUSATE SODIUM 100 MG PO CAPS: 100.0000 mg | ORAL_CAPSULE | Freq: Two times a day (BID) | ORAL | 0 refills | Status: DC

## 2017-01-19 MED ORDER — FERROUS SULFATE 325 (65 FE) MG PO TABS: 325.0000 mg | ORAL_TABLET | Freq: Two times a day (BID) | ORAL | 3 refills | Status: DC

## 2017-01-19 MED ORDER — LORAZEPAM 0.5 MG PO TABS: 0.5000 mg | ORAL_TABLET | Freq: Two times a day (BID) | ORAL | 0 refills | Status: DC | PRN

## 2017-01-19 MED ORDER — ENSURE ENLIVE PO LIQD: 237.0000 mL | Freq: Two times a day (BID) | ORAL | 12 refills | Status: DC

## 2017-01-19 MED ORDER — SODIUM CHLORIDE 0.9 % IV BOLUS (SEPSIS)
1000.0000 mL | Freq: Once | INTRAVENOUS | Status: AC
  Administered 2017-01-19: 1000 mL via INTRAVENOUS

## 2017-01-19 NOTE — NC FL2 (Signed)
[place] LEVEL OF CARE SCREENING TOOL     IDENTIFICATION  Patient Name: Izaac Reisig Birthdate: May 29, 1956 Sex: male Admission Date (Current Location): 01/14/2017  [facility] and [place] Number:  Selena Lesser  ([id number]) Facility and Address:  [facility], [address], [place],  [address]      Provider Number: [id number]  Attending Physician Name and Address:  Gladstone Lighter, MD  Relative Name and Phone Number:       Current Level of Care: Hospital Recommended Level of Care: [facility] Prior Approval Number:    Date Approved/Denied:   PASRR Number:  [id number] )  Discharge Plan: Domiciliary (Rest home)    Current Diagnoses: Patient Active Problem List   Diagnosis Date Noted  . Protein-calorie malnutrition, severe 01/16/2017  . Acute hypernatremia 01/14/2017  . Collapse of lung   . Mucus plugging of bronchi   . Acute on chronic respiratory failure ([place])   . Pressure ulcer 02/04/2016  . Right lower lobe pneumonia ([place]) 02/01/2016  . Septic shock ([place]) 02/01/2016  . Hypernatremia 02/01/2016  . Acute renal failure (ARF) ([place]) 02/01/2016  . Lactic acidosis 02/01/2016  . Metabolic encephalopathy 54/62/7035  . Thrombocytopenia ([place]) 02/01/2016  . Anemia 02/01/2016  . Sepsis ([place]) 02/01/2016    Orientation RESPIRATION BLADDER Height & Weight     Self  Normal Continent Weight: 263 lb 7.2 oz (119.5 kg) Height:  5\' 7"  (170.2 cm)  BEHAVIORAL SYMPTOMS/MOOD NEUROLOGICAL BOWEL NUTRITION STATUS   (none)  (none) Continent Cardiac diet  AMBULATORY STATUS COMMUNICATION OF NEEDS Skin   Extensive Assist (Wheel Chair bound ) Verbally PU Stage and Appropriate Care (Pressure Ulcer on bilateral heels (wears boots all the time). )                       Personal Care Assistance Level of Assistance  Bathing, Feeding, Dressing Bathing Assistance: Limited assistance Feeding assistance: Limited assistance Dressing  Assistance: Limited assistance     Functional Limitations Info  Sight, Hearing, Speech Sight Info: Adequate Hearing Info: Adequate Speech Info: Adequate    SPECIAL CARE FACTORS FREQUENCY                       Contractures      Additional Factors Info  Code Status, Allergies Code Status Info:  (DNR ) Allergies Info:  (not on file )          Discharge Medications: Please see discharge summary for a list of discharge medications. Medication List    TAKE these medications   acetaminophen 650 MG suppository Commonly known as:  TYLENOL Place 650 mg rectally every 6 (six) hours as needed for mild pain, moderate pain or fever.   aspirin EC 81 MG tablet Take 1 tablet (81 mg total) by mouth daily.   atorvastatin 10 MG tablet Commonly known as:  LIPITOR Take 1 tablet (10 mg total) by mouth daily.   bisacodyl 10 MG suppository Commonly known as:  DULCOLAX Place 10 mg rectally as needed for moderate constipation.   clopidogrel 75 MG tablet Commonly known as:  PLAVIX Take 1 tablet (75 mg total) by mouth daily.   docusate sodium 100 MG capsule Commonly known as:  COLACE Take 1 capsule (100 mg total) by mouth 2 (two) times daily.   feeding supplement (ENSURE ENLIVE) Liqd Take 237 mLs by mouth 2 (two) times daily between meals.   ferrous sulfate 325 (65 FE) MG tablet Take 1  tablet (325 mg total) by mouth 2 (two) times daily with a meal.   ipratropium-albuterol 0.5-2.5 (3) MG/3ML Soln Commonly known as:  DUONEB Take 3 mLs by nebulization every 4 (four) hours as needed.   levETIRAcetam 100 MG/ML solution Commonly known as:  KEPPRA Take 5 mLs (500 mg total) by mouth 2 (two) times daily.   LORazepam 0.5 MG tablet Commonly known as:  ATIVAN Take 1 tablet (0.5 mg total) by mouth 2 (two) times daily as needed (anxiety or agitation).   midodrine 5 MG tablet Commonly known as:  PROAMATINE Take 1 tablet (5 mg total) by mouth 2 (two) times daily with a  meal.   MILK OF MAGNESIA PO Take 30-60 mLs by mouth daily as needed.   prochlorperazine 25 MG suppository Commonly known as:  COMPAZINE Place 25 mg rectally every 8 (eight) hours as needed for nausea or vomiting.   sennosides-docusate sodium 8.6-50 MG tablet Commonly known as:  SENOKOT-S Take 1-2 tablets by mouth 2 (two) times daily as needed for constipation.   sertraline 50 MG tablet Commonly known as:  ZOLOFT Take 1 tablet (50 mg total) by mouth at bedtime.   Relevant Imaging Results: Relevant Lab Results: Additional Information  (SSN: [id number])  Kanyia Heaslip, Veronia Beets, LCSW

## 2017-01-19 NOTE — Progress Notes (Signed)
Patient is medically stable for D/C today back to [facility] group home ([address], [place], [place] [address]). Clinical Education officer, museum (CSW) contacted group home RN Mariann Laster who confirmed address and reported that patient can return today via EMS. CSW sent Mariann Laster D/C Summary and FL2. RN will call report to Mariann Laster and arrange EMS for transport. Please reconsult if future social work needs arise. CSW signing off.   [employer], LCSW [phone]

## 2017-01-19 NOTE — Progress Notes (Signed)
Per [facility] crew chief they will not be able to transport patient back to his group home in [place] today because of the inclement weather. Clinical Education officer, museum (CSW) spoke to both [place] directors and made them aware of above. CSW contacted [facility] and they stated they would not transport patient's in other counties. CSW contacted [place] and [place] wheel chair Lucianne [facility] transport who both stated they could not transport due to inclement weather. Patient will have to remain at [facility] until [facility] can transport. Mariann Laster RN at group home aware of above. RN and MD aware of above.   [employer], LCSW [phone]

## 2017-01-19 NOTE — Discharge Summary (Signed)
[facility] [place] NAME: [name]    MR#:  [id number]  DATE OF BIRTH:  May 24, 1956  DATE OF ADMISSION:  01/14/2017   ADMITTING PHYSICIAN: Fritzi Mandes, MD  DATE OF DISCHARGE: 01/19/2017  PRIMARY CARE PHYSICIAN: Vista Mink, FNP   ADMISSION DIAGNOSIS:   Hyperchloremia [E87.8] Hypernatremia [E87.0]  DISCHARGE DIAGNOSIS:   Active Problems:   Acute hypernatremia   Protein-calorie malnutrition, severe   SECONDARY DIAGNOSIS:   Past Medical History:  Diagnosis Date  . Anginal pain ([place])   . Depression   . Diabetes mellitus without complication ([place])   . Discontinued smoking   . Hypertension   . Peripheral vascular disease ([place])   . Schizophrenia ([place])   . Stroke [facility])     HOSPITAL COURSE:   61 year old male with past medical history significant for schizophrenia, bipolar, CVA with right-sided weakness, peripheral vascular disease was brought from group home secondary to abnormal labs. Noted to have hypernatremia.  #1 severe hypernatremia- could be freewater deficit. Sodium was 180 on admission. Was on D5 W, -Off D5 water since yesterday. Sodium is at 142. -Avoid dehydration at baseline. -Appreciate nephrology consult. 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 -Patient is not on any lithium for his bipolar - more alert and attentive today.  #2 anemia- of chronic disease likely, patient has chronic anemia based on his labs from 2017 Baseline hb after fluids always staying around 7 - the drop since admission is likely dilutional. No indication for transfusion if hb>7.   #3 history of schizophrenia-patient is from group home. No guardian. Continue home medications. Patient on Zoloft. Also on Keppra.  #4 history of CVA-with right-sided weakness. Continue aspirin, Plavix and statin.  #5 hypotension-chronic hypertension,  continue midodrine    plan to discharge back to his group home. Patient is wheelchair bound at baseline   DISCHARGE CONDITIONS:   Guarded  CONSULTS OBTAINED:   Treatment Team:  Murlean Iba, MD  DRUG ALLERGIES:   Not on File DISCHARGE MEDICATIONS:   Allergies as of 01/19/2017   Not on File     Medication List    TAKE these medications   acetaminophen 650 MG suppository Commonly known as:  TYLENOL Place 650 mg rectally every 6 (six) hours as needed for mild pain, moderate pain or fever.   aspirin EC 81 MG tablet Take 1 tablet (81 mg total) by mouth daily.   atorvastatin 10 MG tablet Commonly known as:  LIPITOR Take 1 tablet (10 mg total) by mouth daily.   bisacodyl 10 MG suppository Commonly known as:  DULCOLAX Place 10 mg rectally as needed for moderate constipation.   clopidogrel 75 MG tablet Commonly known as:  PLAVIX Take 1 tablet (75 mg total) by mouth daily.   docusate sodium 100 MG capsule Commonly known as:  COLACE Take 1 capsule (100 mg total) by mouth 2 (two) times daily.   feeding supplement (ENSURE ENLIVE) Liqd Take 237 mLs by mouth 2 (two) times daily between meals.   ferrous sulfate 325 (65 FE) MG tablet Take 1 tablet (325 mg total) by mouth 2 (two) times daily with a meal.   ipratropium-albuterol 0.5-2.5 (3) MG/3ML Soln Commonly known as:  DUONEB Take 3 mLs by nebulization every 4 (four) hours as needed.   levETIRAcetam 100 MG/ML solution Commonly known as:  KEPPRA Take 5 mLs (500 mg total) by mouth 2 (two)  times daily.   LORazepam 0.5 MG tablet Commonly known as:  ATIVAN Take 1 tablet (0.5 mg total) by mouth 2 (two) times daily as needed (anxiety or agitation).   midodrine 5 MG tablet Commonly known as:  PROAMATINE Take 1 tablet (5 mg total) by mouth 2 (two) times daily with a meal.   MILK OF MAGNESIA PO Take 30-60 mLs by mouth daily as needed.   prochlorperazine 25 MG suppository Commonly known as:  COMPAZINE Place 25 mg  rectally every 8 (eight) hours as needed for nausea or vomiting.   sennosides-docusate sodium 8.6-50 MG tablet Commonly known as:  SENOKOT-S Take 1-2 tablets by mouth 2 (two) times daily as needed for constipation.   sertraline 50 MG tablet Commonly known as:  ZOLOFT Take 1 tablet (50 mg total) by mouth at bedtime.        DISCHARGE INSTRUCTIONS:   1. PCP follow-up in 1-2 weeks  DIET:   Cardiac diet  ACTIVITY:   Activity as tolerated  OXYGEN:   Home Oxygen: No.  Oxygen Delivery: room air  DISCHARGE LOCATION:   group home   If you experience worsening of your admission symptoms, develop shortness of breath, life threatening emergency, suicidal or homicidal thoughts you must seek medical attention immediately by calling 911 or calling your MD immediately  if symptoms less severe.  You Must read complete instructions/literature along with all the possible adverse reactions/side effects for all the Medicines you take and that have been prescribed to you. Take any new Medicines after you have completely understood and accpet all the possible adverse reactions/side effects.   Please note  You were cared for by a hospitalist during your hospital stay. If you have any questions about your discharge medications or the care you received while you were in the hospital after you are discharged, you can call the unit and asked to speak with the hospitalist on call if the hospitalist that took care of you is not available. Once you are discharged, your primary care physician will handle any further medical issues. Please note that NO REFILLS for any discharge medications will be authorized once you are discharged, as it is imperative that you return to your primary care physician (or establish a relationship with a primary care physician if you do not have one) for your aftercare needs so that they can reassess your need for medications and monitor your lab values.    On the day of  Discharge:  VITAL SIGNS:   Blood pressure (!) 85/59, pulse 71, temperature 97.9 F (36.6 C), temperature source Oral, resp. rate 18, height 5\' 7"  (1.702 m), weight 119.5 kg (263 lb 7.2 oz), SpO2 100 %.  PHYSICAL EXAMINATION:   GENERAL:  61 y.o.-year-old patient lying in the bed with no acute distress.  EYES: Pupils equal, round, reactive to light and accommodation. No scleral icterus. Extraocular muscles intact.  HEENT: Head atraumatic, normocephalic. Oropharynx and nasopharynx clear.  NECK:  Supple, no jugular venous distention. No thyroid enlargement, no tenderness.  LUNGS: Normal breath sounds bilaterally, no wheezing, rales,rhonchi or crepitation. No use of accessory muscles of respiration.  CARDIOVASCULAR: S1, S2 normal. No  rubs, or gallops. 3/6 systolic murmur present. ABDOMEN: Soft, nontender, nondistended. Bowel sounds present. No organomegaly or mass.  EXTREMITIES: No pedal edema, cyanosis, or clubbing. Atrophic lower extremities NEUROLOGIC: following some simple commands, 2/5 RLE, 2/5 RUE with contracted hand, 4/5 LUE and 3/5 LLE strength noted. Sensation is intact on left side PSYCHIATRIC: The patient is  more alert. SKIN: No obvious rash, lesion, or ulcer.    DATA REVIEW:   CBC  Recent Labs Lab 01/18/17 0829  WBC 4.0  HGB 7.5*  HCT 22.5*  PLT 93*    Chemistries   Recent Labs Lab 01/15/17 0720  01/19/17 0336  NA 162*  < > 142  K 2.7*  < > 4.4  CL >130*  < > 113*  CO2 21*  < > 26  GLUCOSE 200*  < > 122*  BUN 38*  < > 18  CREATININE 1.42*  < > 1.01  CALCIUM 8.2*  < > 8.0*  MG 2.3  --   --   < > = values in this interval not displayed.   Microbiology Results  Results for orders placed or performed during the hospital encounter of 01/14/17  MRSA PCR Screening     Status: None   Collection Time: 01/14/17  6:00 PM  Result Value Ref Range Status   MRSA by PCR NEGATIVE NEGATIVE Final    Comment:        The GeneXpert MRSA Assay (FDA approved for NASAL  specimens only), is one component of a comprehensive MRSA colonization surveillance program. It is not intended to diagnose MRSA infection nor to guide or monitor treatment for MRSA infections.     RADIOLOGY:  No results found.   Management plans discussed with the patient, family and they are in agreement.  CODE STATUS:     Code Status Orders        Start     Ordered   01/14/17 1723  Do not attempt resuscitation (DNR)  Continuous    Question Answer Comment  In the event of cardiac or respiratory ARREST Do not call a "code blue"   In the event of cardiac or respiratory ARREST Do not perform Intubation, CPR, defibrillation or ACLS   In the event of cardiac or respiratory ARREST Use medication by any route, position, wound care, and other measures to relive pain and suffering. May use oxygen, suction and manual treatment of airway obstruction as needed for comfort.      01/14/17 1724    Code Status History    Date Active Date Inactive Code Status Order ID Comments User Context   01/14/2017  4:17 PM 01/14/2017  5:24 PM Full Code [id number]  Fritzi Mandes, MD Inpatient   06/05/2016 10:08 AM 06/05/2016  3:17 PM Full Code [id number]  Algernon Huxley, MD Inpatient   05/26/2016  2:50 PM 05/26/2016  8:08 PM Full Code [id number]  Algernon Huxley, MD Inpatient   02/01/2016  4:38 PM 02/14/2016  6:54 PM DNR [id number]  Laverle Hobby, MD Inpatient   02/01/2016  3:48 PM 02/01/2016  4:38 PM Full Code [id number]  Theodoro Grist, MD Inpatient      TOTAL TIME TAKING CARE OF THIS PATIENT: 37 minutes.    Gladstone Lighter M.D on 01/19/2017 at 9:12 AM  Between 7am to 6pm - Pager - [phone]  After 6pm go to [email or web address] - Proofreader  [facility]  Office  [phone]  CC: Primary care physician; Vista Mink, FNP   Note: This dictation was prepared with Dragon dictation along with smaller phrase technology. Any transcriptional errors that result from this  process are unintentional.

## 2017-01-19 NOTE — Progress Notes (Signed)
Took over care at 1500. Patient took meds this pm with applesauce. Incont of urine. Combative at times. Bed alarm on for safety. No IV in place d/t patient's expected discharge canceled because of weather conditions.

## 2017-01-19 NOTE — Progress Notes (Signed)
Called MD for BP 79/54. Fluid bolus ordered. BP now 81/46. NaCl running at 75cc continuous. Pt became agitated this AM and swung at nurse while taking VS. Will monitor.

## 2017-01-19 NOTE — Progress Notes (Signed)
Shift assessment completed at 0815. Pt is minimally cooperative with being changed for incontinence of urine, lungs are clear bilat, pt is on room air. Telebox in place. Sr noted. Abdomen is soft, bs heard. Pt has bilat prevlon boot on with small pink foam dressing noted to R inner ankle. piv #20 intact to r fa with iv ns infusing at 63mls/hr. Since Dr. Tressia Miners has rounded, pt has been combative with medication, refusing all and attempting to hit staff. PIV removed with two staff members, ivf dc'd, pt is ready for d/c per md order. [facility]n L[facility] was notified of this and ems called at approx 1100. Pt is awaiting transport. Pt has been fed breakfast and lunch. Pt remains with knees up to his chin, and will not straighten them out, although legs move easliy when staff must straighten them for pt care. Pt does nto appear to be in pain.

## 2017-01-20 LAB — GLUCOSE, CAPILLARY: Glucose-Capillary: 102 mg/dL — ABNORMAL HIGH (ref 65–99)

## 2017-01-20 NOTE — Progress Notes (Signed)
Per [facility] EMS staff member Lennette Bihari they can transport patient today. Patient is medically stable for D/C today back to [facility] group home ([address], [place], [place] [address]). Clinical Education officer, museum (CSW) left a Special educational needs teacher at [employer]. CSW also contacted the group home and spoke with staff member Letta Median and made her aware of above. Per Letta Median patient can return today. CSW sent D/C orders to group home on yesterday, orders remain the same. RN aware of above. Please reconsult if future social work needs arise. CSW signing off.   [employer], LCSW [phone]

## 2017-01-20 NOTE — Progress Notes (Signed)
Patient is alert and oriented to self. Vital signs stable. EMS called to transport patient to [facility] care group Home. Awaiting EMS arrival at this time.   Bethann Punches, RN

## 2017-02-08 ENCOUNTER — Emergency Department
Admission: EM | Admit: 2017-02-08 | Discharge: 2017-02-09 | Disposition: A | Discharge status: Home / Self Care | Payer: Medicare Other | Attending: Emergency Medicine | Admitting: Emergency Medicine

## 2017-02-08 DIAGNOSIS — I10 Essential (primary) hypertension: Secondary | ICD-10-CM | POA: Diagnosis not present

## 2017-02-08 DIAGNOSIS — E119 Type 2 diabetes mellitus without complications: Secondary | ICD-10-CM | POA: Diagnosis not present

## 2017-02-08 DIAGNOSIS — Z87891 Personal history of nicotine dependence: Secondary | ICD-10-CM | POA: Diagnosis not present

## 2017-02-08 DIAGNOSIS — Z79899 Other long term (current) drug therapy: Secondary | ICD-10-CM | POA: Diagnosis not present

## 2017-02-08 DIAGNOSIS — G40909 Epilepsy, unspecified, not intractable, without status epilepticus: Secondary | ICD-10-CM | POA: Diagnosis present

## 2017-02-08 DIAGNOSIS — Z7982 Long term (current) use of aspirin: Secondary | ICD-10-CM | POA: Diagnosis not present

## 2017-02-08 DIAGNOSIS — R569 Unspecified convulsions: Secondary | ICD-10-CM

## 2017-02-08 LAB — COMPREHENSIVE METABOLIC PANEL
ALT: 11 U/L — AB (ref 17–63)
AST: 17 U/L (ref 15–41)
Albumin: 3.4 g/dL — ABNORMAL LOW (ref 3.5–5.0)
Alkaline Phosphatase: 55 U/L (ref 38–126)
Anion gap: 8 (ref 5–15)
BILIRUBIN TOTAL: 0.5 mg/dL (ref 0.3–1.2)
BUN: 16 mg/dL (ref 6–20)
CO2: 26 mmol/L (ref 22–32)
Calcium: 8.9 mg/dL (ref 8.9–10.3)
Chloride: 105 mmol/L (ref 101–111)
Creatinine, Ser: 0.92 mg/dL (ref 0.61–1.24)
GFR calc Af Amer: >60 mL/min (ref >60)
Glucose, Bld: 101 mg/dL — ABNORMAL HIGH (ref 65–99)
POTASSIUM: 4.1 mmol/L (ref 3.5–5.1)
Sodium: 139 mmol/L (ref 135–145)
TOTAL PROTEIN: 7 g/dL (ref 6.5–8.1)

## 2017-02-08 LAB — CBC
HEMATOCRIT: 30.2 % — AB (ref 40.0–52.0)
Hemoglobin: 10.2 g/dL — ABNORMAL LOW (ref 13.0–18.0)
MCH: 33.2 pg (ref 26.0–34.0)
MCHC: 33.7 g/dL (ref 32.0–36.0)
MCV: 98.5 fL (ref 80.0–100.0)
Platelets: 174 10*3/uL (ref 150–440)
RBC: 3.06 MIL/uL — ABNORMAL LOW (ref 4.40–5.90)
RDW: 15.6 % — AB (ref 11.5–14.5)
WBC: 3.3 10*3/uL — AB (ref 3.8–10.6)

## 2017-02-08 NOTE — ED Notes (Signed)
[place] at [phone] left message on machine that patient was being discharged and would need a ride home to facility.

## 2017-02-08 NOTE — ED Provider Notes (Signed)
[facility] Emergency Department Provider Note   ____________________________________________    I have reviewed the triage vital signs and the nursing notes.   HISTORY  Chief Complaint Seizures  History limited, patient unable to answer questions about his medical history   HPI [name] is a 61 y.o. male who presents for reported seizure. Apparently he had a seizure which caused him to slide off of the bed.  He takes Keppra for seizures. Of note he was admitted to the hospital earlier in the month for severe hypernatremia   Past Medical History:  Diagnosis Date  . Anginal pain ([place])   . Depression   . Diabetes mellitus without complication ([place])   . Discontinued smoking   . Hypertension   . Peripheral vascular disease ([place])   . Schizophrenia ([place])   . Stroke [facility])     Patient Active Problem List   Diagnosis Date Noted  . Protein-calorie malnutrition, severe 01/16/2017  . Acute hypernatremia 01/14/2017  . Collapse of lung   . Mucus plugging of bronchi   . Acute on chronic respiratory failure ([place])   . Pressure ulcer 02/04/2016  . Right lower lobe pneumonia ([place]) 02/01/2016  . Septic shock ([place]) 02/01/2016  . Hypernatremia 02/01/2016  . Acute renal failure (ARF) ([place]) 02/01/2016  . Lactic acidosis 02/01/2016  . Metabolic encephalopathy 21/97/5883  . Thrombocytopenia ([place]) 02/01/2016  . Anemia 02/01/2016  . Sepsis ([place]) 02/01/2016    Past Surgical History:  Procedure Laterality Date  . PERIPHERAL VASCULAR CATHETERIZATION Left 05/26/2016   Procedure: Lower Extremity Angiography;  Surgeon: Algernon Huxley, MD;  Location: [place] CV LAB;  Service: Cardiovascular;  Laterality: Left;  . PERIPHERAL VASCULAR CATHETERIZATION  05/26/2016   Procedure: Lower Extremity Intervention;  Surgeon: Algernon Huxley, MD;  Location: [place] CV LAB;  Service: Cardiovascular;;  . PERIPHERAL VASCULAR CATHETERIZATION Right 06/05/2016   Procedure:  Lower Extremity Angiography;  Surgeon: Algernon Huxley, MD;  Location: [place] CV LAB;  Service: Cardiovascular;  Laterality: Right;  . periphervascular procedures      Prior to Admission medications   Medication Sig Start Date End Date Taking? Authorizing Provider  acetaminophen (TYLENOL) 650 MG suppository Place 650 mg rectally every 6 (six) hours as needed for mild pain, moderate pain or fever.    Historical Provider, MD  aspirin EC 81 MG tablet Take 1 tablet (81 mg total) by mouth daily. 05/26/16   Algernon Huxley, MD  atorvastatin (LIPITOR) 10 MG tablet Take 1 tablet (10 mg total) by mouth daily. 05/26/16   Algernon Huxley, MD  bisacodyl (DULCOLAX) 10 MG suppository Place 10 mg rectally as needed for moderate constipation.    Historical Provider, MD  clopidogrel (PLAVIX) 75 MG tablet Take 1 tablet (75 mg total) by mouth daily. 05/26/16   Algernon Huxley, MD  docusate sodium (COLACE) 100 MG capsule Take 1 capsule (100 mg total) by mouth 2 (two) times daily. 01/19/17   Gladstone Lighter, MD  feeding supplement, ENSURE ENLIVE, (ENSURE ENLIVE) LIQD Take 237 mLs by mouth 2 (two) times daily between meals. 01/19/17   Gladstone Lighter, MD  ferrous sulfate 325 (65 FE) MG tablet Take 1 tablet (325 mg total) by mouth 2 (two) times daily with a meal. 01/19/17   Gladstone Lighter, MD  ipratropium-albuterol (DUONEB) 0.5-2.5 (3) MG/3ML SOLN Take 3 mLs by nebulization every 4 (four) hours as needed. 02/14/16   Colleen Can, MD  levETIRAcetam (KEPPRA) 100 MG/ML solution Take  5 mLs (500 mg total) by mouth 2 (two) times daily. 02/14/16   Colleen Can, MD  LORazepam (ATIVAN) 0.5 MG tablet Take 1 tablet (0.5 mg total) by mouth 2 (two) times daily as needed (anxiety or agitation). 01/19/17   Gladstone Lighter, MD  Magnesium Hydroxide (MILK OF MAGNESIA PO) Take 30-60 mLs by mouth daily as needed.    Historical Provider, MD  midodrine (PROAMATINE) 5 MG tablet Take 1 tablet (5 mg total) by mouth 2 (two) times daily with a  meal. 02/14/16   Colleen Can, MD  prochlorperazine (COMPAZINE) 25 MG suppository Place 25 mg rectally every 8 (eight) hours as needed for nausea or vomiting.    Historical Provider, MD  sennosides-docusate sodium (SENOKOT-S) 8.6-50 MG tablet Take 1-2 tablets by mouth 2 (two) times daily as needed for constipation.    Historical Provider, MD  sertraline (ZOLOFT) 50 MG tablet Take 1 tablet (50 mg total) by mouth at bedtime. 02/13/16   Bettey Costa, MD     Allergies Patient has no allergy information on record.  No family history on file.  Social History Social History  Substance Use Topics  . Smoking status: Former Smoker    Packs/day: 1.00    Years: 30.00    Types: Cigarettes  . Smokeless tobacco: Never Used  . Alcohol use No     Comment: pt UTA    L5 caveat: Unable to obtain Review of Systems    ____________________________________________   PHYSICAL EXAM:  VITAL SIGNS: ED Triage Vitals  Enc Vitals Group     BP 02/08/17 1843 (!) 118/92     Pulse Rate 02/08/17 1843 74     Resp 02/08/17 1843 18     Temp 02/08/17 1843 98.5 F (36.9 C)     Temp Source 02/08/17 1843 Oral     SpO2 02/08/17 1839 97 %     Weight 02/08/17 1843 250 lb (113.4 kg)     Height 02/08/17 1843 5\' 10"  (1.778 m)     Head Circumference --      Peak Flow --      Pain Score --      Pain Loc --      Pain Edu? --      Excl. in [place]? --     Constitutional: Alert  No acute distress.  Eyes: Conjunctivae are normal.  Head: Atraumatic. Nose: No congestion/rhinnorhea. Mouth/Throat: Mucous membranes are moist.   Neck:  Painless ROMNo vertebral tenderness to palpation  Cardiovascular: Normal rate, regular rhythm. Grossly normal heart sounds.  Good peripheral circulation. Respiratory: Normal respiratory effort.  No retractions. Lungs CTAB. Gastrointestinal: Soft and nontender. No distention.   Genitourinary: deferred Musculoskeletal:  Warm and well perfused Neurologic:  No gross focal neurologic  deficits are appreciated.     ____________________________________________   LABS (all labs ordered are listed, but only abnormal results are displayed)  Labs Reviewed  CBC - Abnormal; Notable for the following:       Result Value   WBC 3.3 (*)    RBC 3.06 (*)    Hemoglobin 10.2 (*)    HCT 30.2 (*)    RDW 15.6 (*)    All other components within normal limits  COMPREHENSIVE METABOLIC PANEL - Abnormal; Notable for the following:    Glucose, Bld 101 (*)    Albumin 3.4 (*)    ALT 11 (*)    All other components within normal limits   ____________________________________________  EKG  None ____________________________________________  RADIOLOGY  None ____________________________________________   PROCEDURES  Procedure(s) performed: No    Critical Care performed: No ____________________________________________   INITIAL IMPRESSION / ASSESSMENT AND PLAN / ED COURSE  Pertinent labs & imaging results that were available during my care of the patient were reviewed by me and considered in my medical decision making (see chart for details).  Patient presents after reported seizure. He is apparently at his baseline Given his history of severe hypernatremia we will check labs, insert IV and monitor closely.   Patient observed in ED, no further seizure activity. Lab work reassuring. Given that he is at his baseline feel discharge is appropriate.    ____________________________________________   FINAL CLINICAL IMPRESSION(S) / ED DIAGNOSES  Final diagnoses:  Seizure ([place])      NEW MEDICATIONS STARTED DURING THIS VISIT:  New Prescriptions   No medications on file     Note:  This document was prepared using Dragon voice recognition software and may include unintentional dictation errors.    Lavonia Drafts, MD 02/08/17 2117

## 2017-02-08 NOTE — ED Triage Notes (Signed)
Pt arrived via ems for c/o seizure activity that caused him to slid out of bed and into floor - pt is on Keppra for seizures - pt is alert to person at baseline and sits in a wheelchair at baseline - pt came from [facility] - pt denies any pain or injuries

## 2017-02-08 NOTE — ED Notes (Signed)
Pt arrived via ems for c/o seizure activity that caused him to slid out of bed and into floor - pt is on Keppra for seizures - pt is alert to person at baseline and sits in a wheelchair at baseline - pt came from [place] - pt denies any pain or injuries

## 2017-02-09 NOTE — ED Notes (Signed)
[place] at [phone], no answer.

## 2017-02-09 NOTE — ED Notes (Signed)
Gave address and phone # to

## 2017-02-09 NOTE — ED Notes (Signed)
[place] at [phone] no answer

## 2017-02-09 NOTE — ED Notes (Signed)
Called patient home # [phone]

## 2017-02-09 NOTE — ED Notes (Signed)
Pt. Changed out of wet brief.  Pt. Cleaned up and changed into new brief.

## 2017-03-23 ENCOUNTER — Encounter (HOSPITAL_COMMUNITY): Payer: Self-pay | Admitting: Emergency Medicine

## 2017-03-23 ENCOUNTER — Emergency Department (HOSPITAL_COMMUNITY)
Admission: EM | Admit: 2017-03-23 | Discharge: 2017-03-24 | Disposition: A | Discharge status: Home / Self Care | Payer: Medicare Other | Attending: Emergency Medicine | Admitting: Emergency Medicine

## 2017-03-23 DIAGNOSIS — R569 Unspecified convulsions: Secondary | ICD-10-CM | POA: Diagnosis present

## 2017-03-23 DIAGNOSIS — Z87891 Personal history of nicotine dependence: Secondary | ICD-10-CM | POA: Insufficient documentation

## 2017-03-23 DIAGNOSIS — I10 Essential (primary) hypertension: Secondary | ICD-10-CM | POA: Diagnosis not present

## 2017-03-23 DIAGNOSIS — E119 Type 2 diabetes mellitus without complications: Secondary | ICD-10-CM | POA: Diagnosis not present

## 2017-03-23 DIAGNOSIS — Z8673 Personal history of transient ischemic attack (TIA), and cerebral infarction without residual deficits: Secondary | ICD-10-CM | POA: Diagnosis not present

## 2017-03-23 DIAGNOSIS — Z79899 Other long term (current) drug therapy: Secondary | ICD-10-CM | POA: Diagnosis not present

## 2017-03-23 LAB — I-STAT CHEM 8, ED
BUN: 18 mg/dL (ref 6–20)
CHLORIDE: 112 mmol/L — AB (ref 101–111)
CREATININE: 1 mg/dL (ref 0.61–1.24)
Calcium, Ion: 1.18 mmol/L (ref 1.15–1.40)
GLUCOSE: 116 mg/dL — AB (ref 65–99)
HCT: 32 % — ABNORMAL LOW (ref 39.0–52.0)
HEMOGLOBIN: 10.9 g/dL — AB (ref 13.0–17.0)
POTASSIUM: 3.6 mmol/L (ref 3.5–5.1)
SODIUM: 148 mmol/L — AB (ref 135–145)
TCO2: 26 mmol/L (ref 0–100)

## 2017-03-23 MED ORDER — LEVETIRACETAM IN NACL 500 MG/100ML IV SOLN
INTRAVENOUS | Status: AC
  Filled 2017-03-23: qty 100

## 2017-03-23 MED ORDER — SODIUM CHLORIDE 0.9 % IV SOLN
500.0000 mg | Freq: Once | INTRAVENOUS | Status: AC
  Administered 2017-03-23: 500 mg via INTRAVENOUS
  Filled 2017-03-23: qty 5

## 2017-03-23 NOTE — ED Triage Notes (Signed)
Per EMS. Patient from group home with witnessed seizure lasting 1.5 minutes. Patient was in bed and did not fall. Patient has history of seizures.

## 2017-03-23 NOTE — ED Notes (Signed)
ED Provider at bedside. 

## 2017-03-23 NOTE — ED Provider Notes (Signed)
[facility] DEPT Provider Note   CSN: [id number] Arrival date & time: 03/23/17  1800     History   Chief Complaint Chief Complaint  Patient presents with  . Seizures    HPI [name] is a 61 y.o. male.  Patient has seizure today   The history is provided by the nursing home. No language interpreter was used.  Seizures   This is a recurrent problem. The current episode started less than 1 hour ago. The problem has been resolved. There was 1 seizure. Pertinent negatives include no headaches, no chest pain, no cough and no diarrhea. Characteristics do not include eye blinking. The episode was witnessed. Possible causes do not include medication or dosage change.    Past Medical History:  Diagnosis Date  . Anginal pain ([place])   . Depression   . Diabetes mellitus without complication ([place])   . Discontinued smoking   . Hypertension   . Peripheral vascular disease ([employer])   . Schizophrenia ([place])   . Stroke [facility])     Patient Active Problem List   Diagnosis Date Noted  . Protein-calorie malnutrition, severe 01/16/2017  . Acute hypernatremia 01/14/2017  . Collapse of lung   . Mucus plugging of bronchi   . Acute on chronic respiratory failure ([place])   . Pressure ulcer 02/04/2016  . Right lower lobe pneumonia ([place]) 02/01/2016  . Septic shock ([place]) 02/01/2016  . Hypernatremia 02/01/2016  . Acute renal failure (ARF) ([place]) 02/01/2016  . Lactic acidosis 02/01/2016  . Metabolic encephalopathy 36/62/9476  . Thrombocytopenia ([place]) 02/01/2016  . Anemia 02/01/2016  . Sepsis ([place]) 02/01/2016    Past Surgical History:  Procedure Laterality Date  . PERIPHERAL VASCULAR CATHETERIZATION Left 05/26/2016   Procedure: Lower Extremity Angiography;  Surgeon: Algernon Huxley, MD;  Location: [facility];  Service: Cardiovascular;  Laterality: Left;  . PERIPHERAL VASCULAR CATHETERIZATION  05/26/2016   Procedure: Lower Extremity Intervention;  Surgeon: Algernon Huxley, MD;  Location: [place] [facility];  Service: Cardiovascular;;  . PERIPHERAL VASCULAR CATHETERIZATION Right 06/05/2016   Procedure: Lower Extremity Angiography;  Surgeon: Algernon Huxley, MD;  Location: [place] [facility];  Service: Cardiovascular;  Laterality: Right;  . periphervascular procedures         Home Medications    Prior to Admission medications   Medication Sig Start Date End Date Taking? Authorizing Provider  acetaminophen (TYLENOL) 650 MG suppository Place 650 mg rectally every 6 (six) hours as needed for mild pain, moderate pain or fever.   Yes [provider]  aspirin EC 81 MG tablet Take 1 tablet (81 mg total) by mouth daily. 05/26/16  Yes Dew, Erskine Squibb, MD  atorvastatin (LIPITOR) 10 MG tablet Take 1 tablet (10 mg total) by mouth daily. 05/26/16  Yes Dew, Erskine Squibb, MD  bisacodyl (DULCOLAX) 10 MG suppository Place 10 mg rectally as needed for moderate constipation.   Yes [provider]  clopidogrel (PLAVIX) 75 MG tablet Take 1 tablet (75 mg total) by mouth daily. 05/26/16  Yes Dew, Erskine Squibb, MD  docusate sodium (COLACE) 100 MG capsule Take 1 capsule (100 mg total) by mouth 2 (two) times daily. 01/19/17  Yes Gladstone Lighter, MD  feeding supplement, ENSURE ENLIVE, (ENSURE ENLIVE) LIQD Take 237 mLs by mouth 2 (two) times daily between meals. 01/19/17  Yes Gladstone Lighter, MD  ferrous sulfate 325 (65 FE) MG tablet Take 1 tablet (325 mg total) by mouth 2 (two) times daily with a meal. 01/19/17  Yes Gladstone Lighter, MD  ipratropium-albuterol (DUONEB) 0.5-2.5 (3) MG/3ML SOLN Take 3 mLs by nebulization every 4 (four) hours as needed. 02/14/16  Yes Colleen Can, MD  levETIRAcetam (KEPPRA) 100 MG/ML solution Take 5 mLs (500 mg total) by mouth 2 (two) times daily. 02/14/16  Yes Colleen Can, MD  LORazepam (ATIVAN) 0.5 MG tablet Take 1 tablet (0.5 mg total) by mouth 2 (two) times daily as needed (anxiety or agitation). 01/19/17  Yes Gladstone Lighter, MD  Magnesium Hydroxide  (MILK OF MAGNESIA PO) Take 30-60 mLs by mouth daily as needed.   Yes [provider]  midodrine (PROAMATINE) 5 MG tablet Take 1 tablet (5 mg total) by mouth 2 (two) times daily with a meal. 02/14/16  Yes Colleen Can, MD  prochlorperazine (COMPAZINE) 25 MG suppository Place 25 mg rectally every 8 (eight) hours as needed for nausea or vomiting.   Yes [provider]  sennosides-docusate sodium (SENOKOT-S) 8.6-50 MG tablet Take 1-2 tablets by mouth 2 (two) times daily as needed for constipation.   Yes [provider]  sertraline (ZOLOFT) 50 MG tablet Take 1 tablet (50 mg total) by mouth at bedtime. 02/13/16  Yes Bettey Costa, MD  Skin Protectants, Misc. (EUCERIN) cream Apply 1 application topically at bedtime. Applied to feet   Yes [provider]    Family History No family history on file.  Social History Social History  Substance Use Topics  . Smoking status: Former Smoker    Packs/day: 1.00    Years: 30.00    Types: Cigarettes  . Smokeless tobacco: Never Used  . Alcohol use No     Comment: pt UTA     Allergies   Patient has no known allergies.   Review of Systems Review of Systems  Constitutional: Negative for appetite change and fatigue.  HENT: Negative for congestion, ear discharge and sinus pressure.   Eyes: Negative for discharge.  Respiratory: Negative for cough.   Cardiovascular: Negative for chest pain.  Gastrointestinal: Negative for abdominal pain and diarrhea.  Genitourinary: Negative for frequency and hematuria.  Musculoskeletal: Negative for back pain.  Skin: Negative for rash.  Neurological: Positive for seizures. Negative for headaches.  Psychiatric/Behavioral: Negative for hallucinations.     Physical Exam Updated Vital Signs BP 105/67   Pulse (!) 55   Temp 98.7 F (37.1 C) (Oral)   Resp 16   Ht 5\' 6"  (1.676 m)   Wt 110 lb (49.9 kg)   SpO2 94%   BMI 17.75 kg/m   Physical Exam  Constitutional: He is  oriented to person, place, and time. He appears well-developed.  HENT:  Head: Normocephalic.  Eyes: Conjunctivae and EOM are normal. No scleral icterus.  Neck: Neck supple. No thyromegaly present.  Cardiovascular: Normal rate and regular rhythm.  Exam reveals no gallop and no friction rub.   No murmur heard. Pulmonary/Chest: No stridor. He has no wheezes. He has no rales. He exhibits no tenderness.  Abdominal: He exhibits no distension. There is no tenderness. There is no rebound.  Musculoskeletal: He exhibits no edema.  Lymphadenopathy:    He has no cervical adenopathy.  Neurological: He is oriented to person, place, and time. He exhibits normal muscle tone. Coordination normal.  Skin: No rash noted. No erythema.  Psychiatric: He has a normal mood and affect. His behavior is normal.     ED Treatments / Results  Labs (all labs ordered are listed, but only abnormal results are displayed) Labs Reviewed  I-STAT  CHEM 8, ED - Abnormal; Notable for the following:       Result Value   Sodium 148 (*)    Chloride 112 (*)    Glucose, Bld 116 (*)    Hemoglobin 10.9 (*)    HCT 32.0 (*)    All other components within normal limits    EKG  EKG Interpretation None       Radiology No results found.  Procedures Procedures (including critical care time)  Medications Ordered in ED Medications  levETIRAcetam (KEPPRA) 500 mg in sodium chloride 0.9 % 100 mL IVPB (0 mg Intravenous Stopped 03/23/17 1948)     Initial Impression / Assessment and Plan / ED Course  I have reviewed the triage vital signs and the nursing notes.  Pertinent labs & imaging results that were available during my care of the patient were reviewed by me and considered in my medical decision making (see chart for details).     Patient with a history of seizures. He had one seizure today. Patient was given an extra 500 mg of Keppra. Labs unremarkable except for mildly elevated sodium. He will follow-up with his  doctor this week and return if any more seizures. Final Clinical Impressions(s) / ED Diagnoses   Final diagnoses:  Seizure [facility])    New Prescriptions New Prescriptions   No medications on file     Milton Ferguson, MD 03/23/17 2252

## 2017-03-23 NOTE — ED Notes (Signed)
Spoke with Lavada Mesi, caretaker of [facility] care, pt is w/c bound and needs to transport via EMS back. Telephone report given on pt status, instructed pt does need his additional dose of Keppra when he returns back to the home

## 2017-03-23 NOTE — Discharge Instructions (Signed)
Take your seizure medicine tonight.  Follow up with your md this week.

## 2017-05-05 NOTE — Progress Notes (Deleted)
[facility]  [phone] [phone]) [phone]  ID: [name] OB: 1956-10-14  MR#: [id number]  EVO#:[id number]  Patient Care Team: Remi Haggard, FNP as PCP - General (Family Medicine)  CHIEF COMPLAINT: Leukopenia, unspecified  INTERVAL HISTORY: ***  REVIEW OF SYSTEMS:   ROS  As per HPI. Otherwise, a complete review of systems is negative.  PAST MEDICAL HISTORY: Past Medical History:  Diagnosis Date  . Anginal pain ([place])   . Depression   . Diabetes mellitus without complication ([place])   . Discontinued smoking   . Hypertension   . Peripheral vascular disease ([place])   . Schizophrenia ([place])   . Stroke [facility])     PAST SURGICAL HISTORY: Past Surgical History:  Procedure Laterality Date  . PERIPHERAL VASCULAR CATHETERIZATION Left 05/26/2016   Procedure: Lower Extremity Angiography;  Surgeon: Algernon Huxley, MD;  Location: [place] [facility];  Service: Cardiovascular;  Laterality: Left;  . PERIPHERAL VASCULAR CATHETERIZATION  05/26/2016   Procedure: Lower Extremity Intervention;  Surgeon: Algernon Huxley, MD;  Location: [place] [facility];  Service: Cardiovascular;;  . PERIPHERAL VASCULAR CATHETERIZATION Right 06/05/2016   Procedure: Lower Extremity Angiography;  Surgeon: Algernon Huxley, MD;  Location: [place] [facility];  Service: Cardiovascular;  Laterality: Right;  . periphervascular procedures      FAMILY HISTORY: No family history on file.  ADVANCED DIRECTIVES (Y/N):  N  HEALTH MAINTENANCE: Social History  Substance Use Topics  . Smoking status: Former Smoker    Packs/day: 1.00    Years: 30.00    Types: Cigarettes  . Smokeless tobacco: Never Used  . Alcohol use No     Comment: pt UTA     Colonoscopy:  PAP:  Bone density:  Lipid panel:  No Known Allergies  Current Outpatient Prescriptions  Medication Sig Dispense Refill  . acetaminophen (TYLENOL) 650 MG suppository Place 650 mg rectally every 6 (six) hours as needed for mild  pain, moderate pain or fever.    Marland Kitchen aspirin EC 81 MG tablet Take 1 tablet (81 mg total) by mouth daily. 150 tablet 2  . atorvastatin (LIPITOR) 10 MG tablet Take 1 tablet (10 mg total) by mouth daily. 30 tablet 5  . bisacodyl (DULCOLAX) 10 MG suppository Place 10 mg rectally as needed for moderate constipation.    . clopidogrel (PLAVIX) 75 MG tablet Take 1 tablet (75 mg total) by mouth daily. 30 tablet 11  . docusate sodium (COLACE) 100 MG capsule Take 1 capsule (100 mg total) by mouth 2 (two) times daily. 10 capsule 0  . feeding supplement, ENSURE ENLIVE, (ENSURE ENLIVE) LIQD Take 237 mLs by mouth 2 (two) times daily between meals. 237 mL 12  . ferrous sulfate 325 (65 FE) MG tablet Take 1 tablet (325 mg total) by mouth 2 (two) times daily with a meal. 60 tablet 3  . ipratropium-albuterol (DUONEB) 0.5-2.5 (3) MG/3ML SOLN Take 3 mLs by nebulization every 4 (four) hours as needed. 30 mL 0  . levETIRAcetam (KEPPRA) 100 MG/ML solution Take 5 mLs (500 mg total) by mouth 2 (two) times daily. 473 mL 0  . LORazepam (ATIVAN) 0.5 MG tablet Take 1 tablet (0.5 mg total) by mouth 2 (two) times daily as needed (anxiety or agitation). 30 tablet 0  . Magnesium Hydroxide (MILK OF MAGNESIA PO) Take 30-60 mLs by mouth daily as needed.    . midodrine (PROAMATINE) 5 MG tablet Take 1 tablet (5 mg total) by mouth 2 (two) times daily with a  meal. 30 tablet 0  . prochlorperazine (COMPAZINE) 25 MG suppository Place 25 mg rectally every 8 (eight) hours as needed for nausea or vomiting.    . sennosides-docusate sodium (SENOKOT-S) 8.6-50 MG tablet Take 1-2 tablets by mouth 2 (two) times daily as needed for constipation.    . sertraline (ZOLOFT) 50 MG tablet Take 1 tablet (50 mg total) by mouth at bedtime. 30 tablet 0  . Skin Protectants, Misc. (EUCERIN) cream Apply 1 application topically at bedtime. Applied to feet     No current facility-administered medications for this visit.     OBJECTIVE: There were no vitals filed for  this visit.   There is no height or weight on file to calculate BMI.    ECOG FS:{CHL ONC [id number]  General: Well-developed, well-nourished, no acute distress. Eyes: Pink conjunctiva, anicteric sclera. HEENT: Normocephalic, moist mucous membranes, clear oropharnyx. Lungs: Clear to auscultation bilaterally. Heart: Regular rate and rhythm. No rubs, murmurs, or gallops. Abdomen: Soft, nontender, nondistended. No organomegaly noted, normoactive bowel sounds. Musculoskeletal: No edema, cyanosis, or clubbing. Neuro: Alert, answering all questions appropriately. Cranial nerves grossly intact. Skin: No rashes or petechiae noted. Psych: Normal affect. Lymphatics: No cervical, calvicular, axillary or inguinal LAD.   LAB RESULTS:  Lab Results  Component Value Date   NA 148 (H) 03/23/2017   K 3.6 03/23/2017   CL 112 (H) 03/23/2017   CO2 26 02/08/2017   GLUCOSE 116 (H) 03/23/2017   BUN 18 03/23/2017   CREATININE 1.00 03/23/2017   CALCIUM 8.9 02/08/2017   PROT 7.0 02/08/2017   ALBUMIN 3.4 (L) 02/08/2017   AST 17 02/08/2017   ALT 11 (L) 02/08/2017   ALKPHOS 55 02/08/2017   BILITOT 0.5 02/08/2017   GFRNONAA >60 02/08/2017   GFRAA >60 02/08/2017    Lab Results  Component Value Date   WBC 3.3 (L) 02/08/2017   NEUTROABS 5.1 02/10/2016   HGB 10.9 (L) 03/23/2017   HCT 32.0 (L) 03/23/2017   MCV 98.5 02/08/2017   PLT 174 02/08/2017     STUDIES: No results found.  ASSESSMENT: Leukopenia, unspecified  PLAN:    1. Leukopenia, unspecified:  Patient expressed understanding and was in agreement with this plan. He also understands that He can call clinic at any time with any questions, concerns, or complaints.   Cancer Staging No matching staging information was found for the patient.  Lloyd Huger, MD   05/05/2017 11:35 PM

## 2017-05-06 ENCOUNTER — Inpatient Hospital Stay: Payer: Medicare Other | Admitting: Oncology

## 2017-05-25 ENCOUNTER — Other Ambulatory Visit (INDEPENDENT_AMBULATORY_CARE_PROVIDER_SITE_OTHER): Payer: Self-pay | Admitting: Vascular Surgery

## 2017-06-09 ENCOUNTER — Inpatient Hospital Stay: Payer: Medicare Other

## 2017-06-09 ENCOUNTER — Encounter: Payer: Self-pay | Admitting: Oncology

## 2017-06-09 ENCOUNTER — Inpatient Hospital Stay: Payer: Medicare Other | Attending: Oncology | Admitting: Oncology

## 2017-06-09 ENCOUNTER — Other Ambulatory Visit: Payer: Self-pay

## 2017-06-09 VITALS — BP 137/70 | HR 65 | Temp 96.7°F | Resp 18 | Ht 70.0 in | Wt 135.0 lb

## 2017-06-09 DIAGNOSIS — Z7982 Long term (current) use of aspirin: Secondary | ICD-10-CM | POA: Diagnosis not present

## 2017-06-09 DIAGNOSIS — Z79899 Other long term (current) drug therapy: Secondary | ICD-10-CM | POA: Diagnosis not present

## 2017-06-09 DIAGNOSIS — F329 Major depressive disorder, single episode, unspecified: Secondary | ICD-10-CM | POA: Diagnosis not present

## 2017-06-09 DIAGNOSIS — D649 Anemia, unspecified: Secondary | ICD-10-CM | POA: Diagnosis not present

## 2017-06-09 DIAGNOSIS — E872 Acidosis: Secondary | ICD-10-CM | POA: Diagnosis not present

## 2017-06-09 DIAGNOSIS — Z87891 Personal history of nicotine dependence: Secondary | ICD-10-CM | POA: Insufficient documentation

## 2017-06-09 DIAGNOSIS — R6521 Severe sepsis with septic shock: Secondary | ICD-10-CM | POA: Insufficient documentation

## 2017-06-09 DIAGNOSIS — D696 Thrombocytopenia, unspecified: Secondary | ICD-10-CM | POA: Insufficient documentation

## 2017-06-09 DIAGNOSIS — J9620 Acute and chronic respiratory failure, unspecified whether with hypoxia or hypercapnia: Secondary | ICD-10-CM | POA: Insufficient documentation

## 2017-06-09 DIAGNOSIS — F209 Schizophrenia, unspecified: Secondary | ICD-10-CM | POA: Diagnosis not present

## 2017-06-09 DIAGNOSIS — D709 Neutropenia, unspecified: Secondary | ICD-10-CM | POA: Diagnosis not present

## 2017-06-09 DIAGNOSIS — E1151 Type 2 diabetes mellitus with diabetic peripheral angiopathy without gangrene: Secondary | ICD-10-CM | POA: Diagnosis not present

## 2017-06-09 LAB — COMPREHENSIVE METABOLIC PANEL
ALT: 9 U/L — ABNORMAL LOW (ref 17–63)
ANION GAP: 9 (ref 5–15)
AST: 16 U/L (ref 15–41)
Albumin: 4.3 g/dL (ref 3.5–5.0)
Alkaline Phosphatase: 58 U/L (ref 38–126)
BILIRUBIN TOTAL: 0.4 mg/dL (ref 0.3–1.2)
BUN: 15 mg/dL (ref 6–20)
CO2: 29 mmol/L (ref 22–32)
Calcium: 9.7 mg/dL (ref 8.9–10.3)
Chloride: 110 mmol/L (ref 101–111)
Creatinine, Ser: 1.06 mg/dL (ref 0.61–1.24)
GFR calc Af Amer: >60 mL/min (ref >60)
Glucose, Bld: 95 mg/dL (ref 65–99)
POTASSIUM: 4 mmol/L (ref 3.5–5.1)
Sodium: 148 mmol/L — ABNORMAL HIGH (ref 135–145)
TOTAL PROTEIN: 7.9 g/dL (ref 6.5–8.1)

## 2017-06-09 LAB — IRON AND TIBC
Iron: 79 ug/dL (ref 45–182)
SATURATION RATIOS: 28 % (ref 17.9–39.5)
TIBC: 279 ug/dL (ref 250–450)
UIBC: 200 ug/dL

## 2017-06-09 LAB — CBC WITH DIFFERENTIAL/PLATELET
Basophils Absolute: 0 10*3/uL (ref 0–0.1)
Basophils Relative: 1 %
Eosinophils Absolute: 0 10*3/uL (ref 0–0.7)
Eosinophils Relative: 1 %
HEMATOCRIT: 33.4 % — AB (ref 40.0–52.0)
Hemoglobin: 11.5 g/dL — ABNORMAL LOW (ref 13.0–18.0)
LYMPHS ABS: 1 10*3/uL (ref 1.0–3.6)
LYMPHS PCT: 29 %
MCH: 32.6 pg (ref 26.0–34.0)
MCHC: 34.4 g/dL (ref 32.0–36.0)
MCV: 94.6 fL (ref 80.0–100.0)
MONO ABS: 0.2 10*3/uL (ref 0.2–1.0)
MONOS PCT: 6 %
NEUTROS ABS: 2.2 10*3/uL (ref 1.4–6.5)
Neutrophils Relative %: 63 %
Platelets: 280 10*3/uL (ref 150–440)
RBC: 3.53 MIL/uL — ABNORMAL LOW (ref 4.40–5.90)
RDW: 15.5 % — AB (ref 11.5–14.5)
WBC: 3.4 10*3/uL — ABNORMAL LOW (ref 3.8–10.6)

## 2017-06-09 LAB — RETICULOCYTES
RBC.: 3.54 MIL/uL — ABNORMAL LOW (ref 4.40–5.90)
Retic Count, Absolute: 28.3 10*3/uL (ref 19.0–183.0)
Retic Ct Pct: 0.8 % (ref 0.4–3.1)

## 2017-06-09 LAB — SEDIMENTATION RATE: SED RATE: 14 mm/h (ref 0–20)

## 2017-06-09 LAB — VITAMIN B12: Vitamin B-12: 443 pg/mL (ref 180–914)

## 2017-06-09 LAB — FERRITIN: FERRITIN: 161 ng/mL (ref 24–336)

## 2017-06-09 LAB — FOLATE: Folate: 9.4 ng/mL (ref >5.9)

## 2017-06-09 NOTE — Progress Notes (Signed)
Hematology/Oncology Consult note [facility] Telephone:([phone] [phone]  Patient Care Team: Remi Haggard, FNP as PCP - General (Family Medicine)   Name of the patient: [name]  [id number]  06-03-1956    Reason for referral- low cbc   Referring physician- [name] FNP  Date of visit: 06/09/17   History of presenting illness- patient is a 61 year old male with a past medical history significant for symptoms of any of, bipolar disorder, history of CVA with right-sided weakness and peripheral vascular disease. He is on Zoloft and Keppra for schizophrenia he has been referred to me for evaluation and management of anemia and leukopenia. Recent CBC from June 2018 showed white count of 2.8, H&H of 12.3/38 and a platelet count of 190. Recent TSH from June 2018 was normal at 1.15.  Patient lives in a group home and is a poor historian. His care givers from group home report no acute issues. He had recurrent foot infections in the past which have now healed. He has also been hospitalized on and off for sepsis/ respiratory infections.   ECOG PS- 3  Pain scale- unable to obtain   Review of systems- limited due to poor verbalization   Review of Systems  Constitutional: Positive for malaise/fatigue. Negative for chills, fever and weight loss.  HENT: Negative for congestion, ear discharge and nosebleeds.   Eyes: Negative for blurred vision.  Respiratory: Negative for cough, hemoptysis, sputum production, shortness of breath and wheezing.   Cardiovascular: Negative for chest pain, palpitations, orthopnea and claudication.  Gastrointestinal: Negative for abdominal pain, blood in stool, constipation, diarrhea, heartburn, melena, nausea and vomiting.  Genitourinary: Negative for dysuria, flank pain, frequency, hematuria and urgency.  Musculoskeletal: Negative for back pain, joint pain and myalgias.  Skin: Negative for rash.  Neurological:  Negative for dizziness, tingling, focal weakness, seizures, weakness and headaches.  Endo/Heme/Allergies: Does not bruise/bleed easily.  Psychiatric/Behavioral: Negative for depression and suicidal ideas. The patient does not have insomnia.     No Known Allergies  Patient Active Problem List   Diagnosis Date Noted  . Leukopenia 05/05/2017  . Protein-calorie malnutrition, severe 01/16/2017  . Acute hypernatremia 01/14/2017  . Collapse of lung   . Mucus plugging of bronchi   . Acute on chronic respiratory failure ([place])   . Pressure ulcer 02/04/2016  . Right lower lobe pneumonia ([place]) 02/01/2016  . Septic shock ([place]) 02/01/2016  . Hypernatremia 02/01/2016  . Acute renal failure (ARF) ([place]) 02/01/2016  . Lactic acidosis 02/01/2016  . Metabolic encephalopathy 62/13/0865  . Thrombocytopenia ([place]) 02/01/2016  . Anemia 02/01/2016  . Sepsis ([place]) 02/01/2016     Past Medical History:  Diagnosis Date  . Anemia   . Anginal pain ([place])   . Collapse of lung 2018  . Decubitus skin ulcer since 2016-17   bilateral heels   . Depression   . Diabetes mellitus without complication ([place])   . Discontinued smoking   . Hypertension   . Metabolic encephalopathy 7846  . Peripheral vascular disease ([place])   . Renal failure (ARF), acute on chronic (HCC)   . Schizophrenia ([place])   . Sepsis ([place]) 2017  . Stroke ([place])   . Thrombocythemia ([place]) 2017     Past Surgical History:  Procedure Laterality Date  . PERIPHERAL VASCULAR CATHETERIZATION Left 05/26/2016   Procedure: Lower Extremity Angiography;  Surgeon: Algernon Huxley, MD;  Location: [place] [facility];  Service: Cardiovascular;  Laterality: Left;  . PERIPHERAL VASCULAR CATHETERIZATION  05/26/2016  Procedure: Lower Extremity Intervention;  Surgeon: Algernon Huxley, MD;  Location: [place] [facility];  Service: Cardiovascular;;  . PERIPHERAL VASCULAR CATHETERIZATION Right 06/05/2016   Procedure: Lower Extremity Angiography;  Surgeon: Algernon Huxley,  MD;  Location: [place] [facility];  Service: Cardiovascular;  Laterality: Right;  . periphervascular procedures      Social History   Social History  . Marital status: Single    Spouse name: N/A  . Number of children: N/A  . Years of education: N/A   Occupational History  . Not on file.   Social History Main Topics  . Smoking status: Former Smoker    Packs/day: 1.00    Years: 30.00    Types: Cigarettes  . Smokeless tobacco: Never Used  . Alcohol use No     Comment:  pt unable to respond. Hx of alcohol abuse (per pt )  . Drug use: No     Comment: unknown pt UTA  . Sexual activity: Not Currently   Other Topics Concern  . Not on file   Social History Narrative  . No narrative on file     Family History  Problem Relation Age of Onset  . Family history unknown: Yes     Current Outpatient Prescriptions:  .  aspirin EC 81 MG tablet, Take 1 tablet (81 mg total) by mouth daily., Disp: 150 tablet, Rfl: 2 .  bisacodyl (DULCOLAX) 10 MG suppository, Place 10 mg rectally as needed for moderate constipation., Disp: , Rfl:  .  clopidogrel (PLAVIX) 75 MG tablet, TAKE 1 TABLET BY MOUTH ONCE DAILY., Disp: 28 tablet, Rfl: 0 .  docusate sodium (COLACE) 100 MG capsule, Take 1 capsule (100 mg total) by mouth 2 (two) times daily., Disp: 10 capsule, Rfl: 0 .  feeding supplement, ENSURE ENLIVE, (ENSURE ENLIVE) LIQD, Take 237 mLs by mouth 2 (two) times daily between meals., Disp: 237 mL, Rfl: 12 .  ferrous sulfate 325 (65 FE) MG tablet, Take 1 tablet (325 mg total) by mouth 2 (two) times daily with a meal., Disp: 60 tablet, Rfl: 3 .  levETIRAcetam (KEPPRA) 100 MG/ML solution, Take 5 mLs (500 mg total) by mouth 2 (two) times daily., Disp: 473 mL, Rfl: 0 .  midodrine (PROAMATINE) 5 MG tablet, Take 1 tablet (5 mg total) by mouth 2 (two) times daily with a meal., Disp: 30 tablet, Rfl: 0 .  sennosides-docusate sodium (SENOKOT-S) 8.6-50 MG tablet, Take 1-2 tablets by mouth 2 (two) times daily as  needed for constipation., Disp: , Rfl:  .  sertraline (ZOLOFT) 50 MG tablet, Take 1 tablet (50 mg total) by mouth at bedtime., Disp: 30 tablet, Rfl: 0 .  Skin Protectants, Misc. (EUCERIN) cream, Apply 1 application topically at bedtime. Applied to feet, Disp: , Rfl:  .  acetaminophen (TYLENOL) 650 MG suppository, Place 650 mg rectally every 6 (six) hours as needed for mild pain, moderate pain or fever., Disp: , Rfl:  .  atorvastatin (LIPITOR) 10 MG tablet, Take 1 tablet (10 mg total) by mouth daily. (Patient not taking: Reported on 06/09/2017), Disp: 30 tablet, Rfl: 5 .  ipratropium-albuterol (DUONEB) 0.5-2.5 (3) MG/3ML SOLN, Take 3 mLs by nebulization every 4 (four) hours as needed., Disp: 30 mL, Rfl: 0 .  LORazepam (ATIVAN) 0.5 MG tablet, Take 1 tablet (0.5 mg total) by mouth 2 (two) times daily as needed (anxiety or agitation). (Patient not taking: Reported on 06/09/2017), Disp: 30 tablet, Rfl: 0 .  Magnesium Hydroxide (MILK OF MAGNESIA PO), Take  30-60 mLs by mouth daily as needed., Disp: , Rfl:  .  prochlorperazine (COMPAZINE) 25 MG suppository, Place 25 mg rectally every 8 (eight) hours as needed for nausea or vomiting., Disp: , Rfl:    Physical exam:  Vitals:   06/09/17 1356  BP: 137/70  Pulse: 65  Resp: 18  Temp: (!) 96.7 F (35.9 C)  TempSrc: Tympanic  Weight: 135 lb (61.2 kg)  Height: '5\' 10"'  (1.778 m)   Physical Exam  Constitutional: He is oriented to person, place, and time.  Thin man sitting in a wheelchair  HENT:  Head: Normocephalic and atraumatic.  Eyes: Pupils are equal, round, and reactive to light. EOM are normal.  Neck: Normal range of motion.  Cardiovascular: Normal rate, regular rhythm and normal heart sounds.   Pulmonary/Chest: Effort normal and breath sounds normal.  Abdominal: Soft. Bowel sounds are normal.  Neurological: He is alert and oriented to person, place, and time.  Right sided hemiparesis  Skin: Skin is warm and dry.       CMP Latest Ref Rng &  Units 03/23/2017  Glucose 65 - 99 mg/dL 116(H)  BUN 6 - 20 mg/dL 18  Creatinine 0.61 - 1.24 mg/dL 1.00  Sodium 135 - 145 mmol/L 148(H)  Potassium 3.5 - 5.1 mmol/L 3.6  Chloride 101 - 111 mmol/L 112(H)  CO2 22 - 32 mmol/L -  Calcium 8.9 - 10.3 mg/dL -  Total Protein 6.5 - 8.1 g/dL -  Total Bilirubin 0.3 - 1.2 mg/dL -  Alkaline Phos 38 - 126 U/L -  AST 15 - 41 U/L -  ALT 17 - 63 U/L -   CBC Latest Ref Rng & Units 03/23/2017  WBC 3.8 - 10.6 K/uL -  Hemoglobin 13.0 - 17.0 g/dL 10.9(L)  Hematocrit 39.0 - 52.0 % 32.0(L)  Platelets 150 - 440 K/uL -    Assessment and plan- Patient is a 61 y.o. male referred for normocytic anemia and mild thrombocytopenia in the past  Today I will check cbc with diff, cmp, ferritin and iron studies, b12, folate, retic count, haptoglobin, myeloma panel., ESR. Also check HIV, hep c antibody testing and pathology review of smear. I will see him back in 3-4 weeks time to discuss results of blood work and further management    Thank you for this kind referral and the opportunity to participate in the care of this patient   Visit Diagnosis 1. Normocytic anemia   2. Thrombocytopenia ([place])   3. Neutropenia, unspecified type [facility])     Dr. Randa Evens, MD, MPH [facility] Pager- [phone] 06/09/2017  2:31 PM

## 2017-06-09 NOTE — Progress Notes (Deleted)
Hematology/Oncology Consult note [facility]  Telephone:([phone] [phone]  Patient Care Team: Remi Haggard, FNP as PCP - General (Family Medicine)   Name of the patient: [name]  [id number]  1956/07/03   Date of visit: 06/09/17  Diagnosis- *  Chief complaint/ Reason for visit- ***  Heme/Onc history: ***  Interval history- ***  ECOG PS- *** Pain scale- *** Opioid associated constipation- ***  Review of systems- ROS   Current treatment- ***  No Known Allergies   Past Medical History:  Diagnosis Date  . Anginal pain ([place])   . Depression   . Diabetes mellitus without complication ([place])   . Discontinued smoking   . Hypertension   . Peripheral vascular disease ([place])   . Schizophrenia ([place])   . Stroke [facility])      Past Surgical History:  Procedure Laterality Date  . PERIPHERAL VASCULAR CATHETERIZATION Left 05/26/2016   Procedure: Lower Extremity Angiography;  Surgeon: Algernon Huxley, MD;  Location: [place] [facility];  Service: Cardiovascular;  Laterality: Left;  . PERIPHERAL VASCULAR CATHETERIZATION  05/26/2016   Procedure: Lower Extremity Intervention;  Surgeon: Algernon Huxley, MD;  Location: [place] [facility];  Service: Cardiovascular;;  . PERIPHERAL VASCULAR CATHETERIZATION Right 06/05/2016   Procedure: Lower Extremity Angiography;  Surgeon: Algernon Huxley, MD;  Location: [place] [facility];  Service: Cardiovascular;  Laterality: Right;  . periphervascular procedures      Social History   Social History  . Marital status: Single    Spouse name: N/A  . Number of children: N/A  . Years of education: N/A   Occupational History  . Not on file.   Social History Main Topics  . Smoking status: Former Smoker    Packs/day: 1.00    Years: 30.00    Types: Cigarettes  . Smokeless tobacco: Never Used  . Alcohol use No     Comment: pt UTA  . Drug use: No     Comment: unknown pt UTA  . Sexual activity: Not  Currently   Other Topics Concern  . Not on file   Social History Narrative  . No narrative on file    No family history on file.   Current Outpatient Prescriptions:  .  acetaminophen (TYLENOL) 650 MG suppository, Place 650 mg rectally every 6 (six) hours as needed for mild pain, moderate pain or fever., Disp: , Rfl:  .  aspirin EC 81 MG tablet, Take 1 tablet (81 mg total) by mouth daily., Disp: 150 tablet, Rfl: 2 .  atorvastatin (LIPITOR) 10 MG tablet, Take 1 tablet (10 mg total) by mouth daily., Disp: 30 tablet, Rfl: 5 .  bisacodyl (DULCOLAX) 10 MG suppository, Place 10 mg rectally as needed for moderate constipation., Disp: , Rfl:  .  clopidogrel (PLAVIX) 75 MG tablet, TAKE 1 TABLET BY MOUTH ONCE DAILY., Disp: 28 tablet, Rfl: 0 .  docusate sodium (COLACE) 100 MG capsule, Take 1 capsule (100 mg total) by mouth 2 (two) times daily., Disp: 10 capsule, Rfl: 0 .  feeding supplement, ENSURE ENLIVE, (ENSURE ENLIVE) LIQD, Take 237 mLs by mouth 2 (two) times daily between meals., Disp: 237 mL, Rfl: 12 .  ferrous sulfate 325 (65 FE) MG tablet, Take 1 tablet (325 mg total) by mouth 2 (two) times daily with a meal., Disp: 60 tablet, Rfl: 3 .  ipratropium-albuterol (DUONEB) 0.5-2.5 (3) MG/3ML SOLN, Take 3 mLs by nebulization every 4 (four) hours as needed., Disp: 30 mL,  Rfl: 0 .  levETIRAcetam (KEPPRA) 100 MG/ML solution, Take 5 mLs (500 mg total) by mouth 2 (two) times daily., Disp: 473 mL, Rfl: 0 .  LORazepam (ATIVAN) 0.5 MG tablet, Take 1 tablet (0.5 mg total) by mouth 2 (two) times daily as needed (anxiety or agitation)., Disp: 30 tablet, Rfl: 0 .  Magnesium Hydroxide (MILK OF MAGNESIA PO), Take 30-60 mLs by mouth daily as needed., Disp: , Rfl:  .  midodrine (PROAMATINE) 5 MG tablet, Take 1 tablet (5 mg total) by mouth 2 (two) times daily with a meal., Disp: 30 tablet, Rfl: 0 .  prochlorperazine (COMPAZINE) 25 MG suppository, Place 25 mg rectally every 8 (eight) hours as needed for nausea or  vomiting., Disp: , Rfl:  .  sennosides-docusate sodium (SENOKOT-S) 8.6-50 MG tablet, Take 1-2 tablets by mouth 2 (two) times daily as needed for constipation., Disp: , Rfl:  .  sertraline (ZOLOFT) 50 MG tablet, Take 1 tablet (50 mg total) by mouth at bedtime., Disp: 30 tablet, Rfl: 0 .  Skin Protectants, Misc. (EUCERIN) cream, Apply 1 application topically at bedtime. Applied to feet, Disp: , Rfl:   Physical exam: There were no vitals filed for this visit. Physical Exam   CMP Latest Ref Rng & Units 03/23/2017  Glucose 65 - 99 mg/dL 116(H)  BUN 6 - 20 mg/dL 18  Creatinine 0.61 - 1.24 mg/dL 1.00  Sodium 135 - 145 mmol/L 148(H)  Potassium 3.5 - 5.1 mmol/L 3.6  Chloride 101 - 111 mmol/L 112(H)  CO2 22 - 32 mmol/L -  Calcium 8.9 - 10.3 mg/dL -  Total Protein 6.5 - 8.1 g/dL -  Total Bilirubin 0.3 - 1.2 mg/dL -  Alkaline Phos 38 - 126 U/L -  AST 15 - 41 U/L -  ALT 17 - 63 U/L -   CBC Latest Ref Rng & Units 03/23/2017  WBC 3.8 - 10.6 K/uL -  Hemoglobin 13.0 - 17.0 g/dL 10.9(L)  Hematocrit 39.0 - 52.0 % 32.0(L)  Platelets 150 - 440 K/uL -    No images are attached to the encounter.  No results found.   Assessment and plan- Patient is a 61 y.o. male ***   Visit Diagnosis No diagnosis found.   Dr. Randa Evens, MD, MPH [place] at [facility] Pager- [phone] 06/09/2017 1:17 PM

## 2017-06-09 NOTE — Progress Notes (Signed)
Here for new pt evaluation. Pt mostly mute accompanied by [facility] staff.  FL2 and med sheets obtained for chart.

## 2017-06-10 LAB — HIV ANTIBODY (ROUTINE TESTING W REFLEX): HIV Screen 4th Generation wRfx: NONREACTIVE

## 2017-06-10 LAB — PATHOLOGIST SMEAR REVIEW

## 2017-06-10 LAB — HEPATITIS C ANTIBODY

## 2017-06-10 LAB — HAPTOGLOBIN: HAPTOGLOBIN: 57 mg/dL (ref 34–200)

## 2017-06-11 ENCOUNTER — Other Ambulatory Visit: Payer: Self-pay

## 2017-06-11 DIAGNOSIS — D709 Neutropenia, unspecified: Secondary | ICD-10-CM

## 2017-06-11 DIAGNOSIS — F209 Schizophrenia, unspecified: Secondary | ICD-10-CM | POA: Insufficient documentation

## 2017-06-11 DIAGNOSIS — D649 Anemia, unspecified: Secondary | ICD-10-CM

## 2017-06-11 DIAGNOSIS — D696 Thrombocytopenia, unspecified: Secondary | ICD-10-CM

## 2017-06-11 DIAGNOSIS — E872 Acidosis: Secondary | ICD-10-CM | POA: Insufficient documentation

## 2017-06-11 DIAGNOSIS — R6521 Severe sepsis with septic shock: Secondary | ICD-10-CM

## 2017-06-11 DIAGNOSIS — Z79899 Other long term (current) drug therapy: Secondary | ICD-10-CM | POA: Insufficient documentation

## 2017-06-11 DIAGNOSIS — F329 Major depressive disorder, single episode, unspecified: Secondary | ICD-10-CM | POA: Insufficient documentation

## 2017-06-11 DIAGNOSIS — E1151 Type 2 diabetes mellitus with diabetic peripheral angiopathy without gangrene: Secondary | ICD-10-CM

## 2017-06-11 DIAGNOSIS — Z7982 Long term (current) use of aspirin: Secondary | ICD-10-CM | POA: Insufficient documentation

## 2017-06-11 DIAGNOSIS — Z87891 Personal history of nicotine dependence: Secondary | ICD-10-CM | POA: Insufficient documentation

## 2017-06-11 DIAGNOSIS — J9620 Acute and chronic respiratory failure, unspecified whether with hypoxia or hypercapnia: Secondary | ICD-10-CM

## 2017-06-11 LAB — MULTIPLE MYELOMA PANEL, SERUM
ALBUMIN SERPL ELPH-MCNC: 3.9 g/dL (ref 2.9–4.4)
ALPHA 1: 0.3 g/dL (ref 0.0–0.4)
ALPHA2 GLOB SERPL ELPH-MCNC: 0.6 g/dL (ref 0.4–1.0)
Albumin/Glob SerPl: 1.2 (ref 0.7–1.7)
B-GLOBULIN SERPL ELPH-MCNC: 1.1 g/dL (ref 0.7–1.3)
Gamma Glob SerPl Elph-Mcnc: 1.5 g/dL (ref 0.4–1.8)
Globulin, Total: 3.4 g/dL (ref 2.2–3.9)
IGG (IMMUNOGLOBIN G), SERUM: 1425 mg/dL (ref 700–1600)
IGM, SERUM: 49 mg/dL (ref 20–172)
IgA: 401 mg/dL — ABNORMAL HIGH (ref 90–386)
TOTAL PROTEIN ELP: 7.3 g/dL (ref 6.0–8.5)

## 2017-06-13 LAB — H. PYLORI ANTIGEN, STOOL: H. PYLORI STOOL AG, EIA: NEGATIVE

## 2017-06-24 ENCOUNTER — Other Ambulatory Visit (INDEPENDENT_AMBULATORY_CARE_PROVIDER_SITE_OTHER): Payer: Self-pay | Admitting: Vascular Surgery

## 2017-07-09 ENCOUNTER — Inpatient Hospital Stay: Payer: Medicare Other | Admitting: Oncology

## 2017-07-14 ENCOUNTER — Inpatient Hospital Stay: Payer: Medicare Other | Admitting: Oncology

## 2017-08-10 NOTE — Progress Notes (Signed)
Hematology/Oncology Consult note [facility]  Telephone:([phone] Fax:([phone]  Patient Care Team: Remi Haggard, FNP as PCP - General (Family Medicine)   Name of the patient: [name]  [id number]  1956/10/28   Date of visit: 08/10/17  Diagnosis- normocytic anemia and leukopenia  Chief complaint/ Reason for visit- routine f/u of anemia and leukopenia Discuss results of blood work  Heme/Onc history: patient is a 61 year old male with a past medical history significant for symptoms of any of, bipolar disorder, history of CVA with right-sided weakness and peripheral vascular disease. He is on Zoloft and Keppra for schizophrenia he has been referred to me for evaluation and management of anemia and leukopenia. Recent CBC from June 2018 showed white count of 2.8, H&H of 12.3/38 and a platelet count of 190. Recent TSH from June 2018 was normal at 1.15.  Patient lives in a group home and is a poor historian. His care givers from group home report no acute issues. He had recurrent foot infections in the past which have now healed. He has also been hospitalized on and off for sepsis/ respiratory infections  Results of bloodwork from 06/09/2017 were as follows: CBC showed white count of 3.4, H&H of 11.5/33.4 and platelet count of 280. Differential on the CBC was normal. CMP showed mild hyponatremia with a sodium of 148. Kidney functions and liver functions were normal. Iron studies B12 and folate were within normal limits. Multiple myeloma panel revealed a mildly increased IgA with apparent polyclonal gammopathy. No monoclonal gammopathy was noted. HIV and hepatitis C testing was negative. Haptoglobin was normal and so H pylori and was negative.  Interval history- no acute issues reported by caregivers at nursing home  ECOG PS- 3 Pain scale- 0   Review of systems- Review of Systems  Constitutional: Positive for malaise/fatigue. Negative for chills,  fever and weight loss.  HENT: Negative for congestion, ear discharge and nosebleeds.   Eyes: Negative for blurred vision.  Respiratory: Negative for cough, hemoptysis, sputum production, shortness of breath and wheezing.   Cardiovascular: Negative for chest pain, palpitations, orthopnea and claudication.  Gastrointestinal: Negative for abdominal pain, blood in stool, constipation, diarrhea, heartburn, melena, nausea and vomiting.  Genitourinary: Negative for dysuria, flank pain, frequency, hematuria and urgency.  Musculoskeletal: Negative for back pain, joint pain and myalgias.  Skin: Negative for rash.  Neurological: Negative for dizziness, tingling, focal weakness, seizures, weakness and headaches.  Endo/Heme/Allergies: Does not bruise/bleed easily.  Psychiatric/Behavioral: Negative for depression and suicidal ideas. The patient does not have insomnia.      No Known Allergies   Past Medical History:  Diagnosis Date  . Anemia   . Anginal pain ([place])   . Collapse of lung 2018  . Decubitus skin ulcer since 2016-17   bilateral heels   . Depression   . Diabetes mellitus without complication ([place])   . Discontinued smoking   . Hypertension   . Metabolic encephalopathy 5697  . Peripheral vascular disease ([place])   . Renal failure (ARF), acute on chronic (HCC)   . Schizophrenia ([place])   . Sepsis ([place]) 2017  . Stroke ([place])   . Thrombocythemia ([place]) 2017     Past Surgical History:  Procedure Laterality Date  . PERIPHERAL VASCULAR CATHETERIZATION Left 05/26/2016   Procedure: Lower Extremity Angiography;  Surgeon: Algernon Huxley, MD;  Location: [place] [facility];  Service: Cardiovascular;  Laterality: Left;  . PERIPHERAL VASCULAR CATHETERIZATION  05/26/2016   Procedure: Lower Extremity  Intervention;  Surgeon: Algernon Huxley, MD;  Location: [place] [facility];  Service: Cardiovascular;;  . PERIPHERAL VASCULAR CATHETERIZATION Right 06/05/2016   Procedure: Lower Extremity Angiography;   Surgeon: Algernon Huxley, MD;  Location: [place] [facility];  Service: Cardiovascular;  Laterality: Right;  . periphervascular procedures      Social History   Social History  . Marital status: Single    Spouse name: N/A  . Number of children: N/A  . Years of education: N/A   Occupational History  . Not on file.   Social History Main Topics  . Smoking status: Former Smoker    Packs/day: 1.00    Years: 30.00    Types: Cigarettes  . Smokeless tobacco: Never Used  . Alcohol use No     Comment:  pt unable to respond. Hx of alcohol abuse (per pt )  . Drug use: No     Comment: unknown pt UTA  . Sexual activity: Not Currently   Other Topics Concern  . Not on file   Social History Narrative  . No narrative on file    Family History  Problem Relation Age of Onset  . Family history unknown: Yes     Current Outpatient Prescriptions:  .  aspirin EC 81 MG tablet, Take 1 tablet (81 mg total) by mouth daily., Disp: 150 tablet, Rfl: 2 .  atorvastatin (LIPITOR) 10 MG tablet, Take 1 tablet (10 mg total) by mouth daily., Disp: 30 tablet, Rfl: 5 .  clopidogrel (PLAVIX) 75 MG tablet, TAKE 1 TABLET BY MOUTH ONCE DAILY., Disp: 28 tablet, Rfl: 0 .  docusate sodium (COLACE) 100 MG capsule, Take 1 capsule (100 mg total) by mouth 2 (two) times daily., Disp: 10 capsule, Rfl: 0 .  feeding supplement, ENSURE ENLIVE, (ENSURE ENLIVE) LIQD, Take 237 mLs by mouth 2 (two) times daily between meals., Disp: 237 mL, Rfl: 12 .  ferrous sulfate 325 (65 FE) MG tablet, Take 1 tablet (325 mg total) by mouth 2 (two) times daily with a meal., Disp: 60 tablet, Rfl: 3 .  ipratropium-albuterol (DUONEB) 0.5-2.5 (3) MG/3ML SOLN, Take 3 mLs by nebulization every 4 (four) hours as needed., Disp: 30 mL, Rfl: 0 .  levETIRAcetam (KEPPRA) 100 MG/ML solution, Take 5 mLs (500 mg total) by mouth 2 (two) times daily., Disp: 473 mL, Rfl: 0 .  LORazepam (ATIVAN) 0.5 MG tablet, Take 1 tablet (0.5 mg total) by mouth 2 (two) times  daily as needed (anxiety or agitation)., Disp: 30 tablet, Rfl: 0 .  midodrine (PROAMATINE) 5 MG tablet, Take 1 tablet (5 mg total) by mouth 2 (two) times daily with a meal., Disp: 30 tablet, Rfl: 0 .  sertraline (ZOLOFT) 50 MG tablet, Take 1 tablet (50 mg total) by mouth at bedtime., Disp: 30 tablet, Rfl: 0 .  Skin Protectants, Misc. (EUCERIN) cream, Apply 1 application topically at bedtime. Applied to feet, Disp: , Rfl:   Physical exam:  Vitals:   08/11/17 1043  Pulse: (!) 58  Resp: 15  Temp: (!) 96.9 F (36.1 C)  TempSrc: Tympanic   Physical Exam  Constitutional:  He is thin. Sitting in a wheelchair. No acute distress  HENT:  Head: Normocephalic and atraumatic.  Eyes: Pupils are equal, round, and reactive to light. EOM are normal.  Neck: Normal range of motion.  Cardiovascular: Normal rate, regular rhythm and normal heart sounds.   Pulmonary/Chest: Effort normal and breath sounds normal.  Abdominal: Soft. Bowel sounds are normal.  Musculoskeletal:  Contact pads in place over b/l LE to prevent ulceration  Neurological: He is alert.  Right sided hemiparesis  Skin: Skin is warm and dry.     CMP Latest Ref Rng & Units 06/09/2017  Glucose 65 - 99 mg/dL 95  BUN 6 - 20 mg/dL 15  Creatinine 0.61 - 1.24 mg/dL 1.06  Sodium 135 - 145 mmol/L 148(H)  Potassium 3.5 - 5.1 mmol/L 4.0  Chloride 101 - 111 mmol/L 110  CO2 22 - 32 mmol/L 29  Calcium 8.9 - 10.3 mg/dL 9.7  Total Protein 6.5 - 8.1 g/dL 7.9  Total Bilirubin 0.3 - 1.2 mg/dL 0.4  Alkaline Phos 38 - 126 U/L 58  AST 15 - 41 U/L 16  ALT 17 - 63 U/L 9(L)   CBC Latest Ref Rng & Units 06/09/2017  WBC 3.8 - 10.6 K/uL 3.4(L)  Hemoglobin 13.0 - 18.0 g/dL 11.5(L)  Hematocrit 40.0 - 52.0 % 33.4(L)  Platelets 150 - 440 K/uL 280     Assessment and plan- Patient is a 61 y.o. 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 Repeat CBC shows  thrombocytopenia has resolved. With regards to his anemia, his H/H has remained stable between 10-11 over last 1 year. Iron studies, B12 and folate and myeloma panel unremarkable. His WBC has been waxing and waning over last 4 months but between 3-4 with a normal differential. Given his underlying comorbidities, I am inclined to monitor this conservatively without a bone marrow biopsy at this time. It could be secondary to MDS. No recent changes in his meds.  Repeat cbc with diff in 4 months. Will obtain results of cbc done today at PCP's office    Visit Diagnosis 1. Other pancytopenia ([place])      Dr. Randa Evens, MD, MPH [facility] at [facility] Pager- [phone] 08/10/2017 3:08 PM

## 2017-08-11 ENCOUNTER — Inpatient Hospital Stay: Payer: Medicare Other | Attending: Oncology | Admitting: Oncology

## 2017-08-11 ENCOUNTER — Encounter: Payer: Self-pay | Admitting: Oncology

## 2017-08-11 VITALS — HR 58 | Temp 96.9°F | Resp 15

## 2017-08-11 DIAGNOSIS — I10 Essential (primary) hypertension: Secondary | ICD-10-CM | POA: Insufficient documentation

## 2017-08-11 DIAGNOSIS — Z8673 Personal history of transient ischemic attack (TIA), and cerebral infarction without residual deficits: Secondary | ICD-10-CM | POA: Insufficient documentation

## 2017-08-11 DIAGNOSIS — E1151 Type 2 diabetes mellitus with diabetic peripheral angiopathy without gangrene: Secondary | ICD-10-CM | POA: Diagnosis not present

## 2017-08-11 DIAGNOSIS — F329 Major depressive disorder, single episode, unspecified: Secondary | ICD-10-CM | POA: Diagnosis not present

## 2017-08-11 DIAGNOSIS — Z87891 Personal history of nicotine dependence: Secondary | ICD-10-CM | POA: Diagnosis not present

## 2017-08-11 DIAGNOSIS — F209 Schizophrenia, unspecified: Secondary | ICD-10-CM | POA: Insufficient documentation

## 2017-08-11 DIAGNOSIS — Z7982 Long term (current) use of aspirin: Secondary | ICD-10-CM | POA: Insufficient documentation

## 2017-08-11 DIAGNOSIS — E871 Hypo-osmolality and hyponatremia: Secondary | ICD-10-CM | POA: Diagnosis not present

## 2017-08-11 DIAGNOSIS — Z79899 Other long term (current) drug therapy: Secondary | ICD-10-CM

## 2017-08-11 DIAGNOSIS — D61818 Other pancytopenia: Secondary | ICD-10-CM | POA: Diagnosis not present

## 2017-08-11 DIAGNOSIS — D890 Polyclonal hypergammaglobulinemia: Secondary | ICD-10-CM | POA: Insufficient documentation

## 2017-08-11 NOTE — Progress Notes (Signed)
Patient here for follow up. He reports no pain. He has already received the Flu Vaccine.

## 2017-08-31 ENCOUNTER — Encounter (HOSPITAL_COMMUNITY): Payer: Self-pay

## 2017-08-31 ENCOUNTER — Emergency Department (HOSPITAL_COMMUNITY)
Admission: EM | Admit: 2017-08-31 | Discharge: 2017-08-31 | Disposition: A | Discharge status: Home / Self Care | Payer: Medicare Other | Attending: Emergency Medicine | Admitting: Emergency Medicine

## 2017-08-31 DIAGNOSIS — E119 Type 2 diabetes mellitus without complications: Secondary | ICD-10-CM | POA: Diagnosis not present

## 2017-08-31 DIAGNOSIS — Z7982 Long term (current) use of aspirin: Secondary | ICD-10-CM | POA: Diagnosis not present

## 2017-08-31 DIAGNOSIS — Z79899 Other long term (current) drug therapy: Secondary | ICD-10-CM | POA: Diagnosis not present

## 2017-08-31 DIAGNOSIS — Z87891 Personal history of nicotine dependence: Secondary | ICD-10-CM | POA: Diagnosis not present

## 2017-08-31 DIAGNOSIS — I10 Essential (primary) hypertension: Secondary | ICD-10-CM | POA: Diagnosis not present

## 2017-08-31 DIAGNOSIS — E860 Dehydration: Secondary | ICD-10-CM | POA: Insufficient documentation

## 2017-08-31 DIAGNOSIS — R195 Other fecal abnormalities: Secondary | ICD-10-CM | POA: Diagnosis present

## 2017-08-31 LAB — COMPREHENSIVE METABOLIC PANEL
ALBUMIN: 4.1 g/dL (ref 3.5–5.0)
ALK PHOS: 55 U/L (ref 38–126)
ALT: 17 U/L (ref 17–63)
ANION GAP: 9 (ref 5–15)
AST: 22 U/L (ref 15–41)
BILIRUBIN TOTAL: 0.8 mg/dL (ref 0.3–1.2)
BUN: 23 mg/dL — ABNORMAL HIGH (ref 6–20)
CALCIUM: 9.6 mg/dL (ref 8.9–10.3)
CO2: 27 mmol/L (ref 22–32)
CREATININE: 1.1 mg/dL (ref 0.61–1.24)
Chloride: 113 mmol/L — ABNORMAL HIGH (ref 101–111)
GFR calc Af Amer: >60 mL/min (ref >60)
GFR calc non Af Amer: >60 mL/min (ref >60)
GLUCOSE: 159 mg/dL — AB (ref 65–99)
Potassium: 4.5 mmol/L (ref 3.5–5.1)
SODIUM: 149 mmol/L — AB (ref 135–145)
TOTAL PROTEIN: 7.6 g/dL (ref 6.5–8.1)

## 2017-08-31 LAB — CBC WITH DIFFERENTIAL/PLATELET
BASOS PCT: 0 %
Basophils Absolute: 0 10*3/uL (ref 0.0–0.1)
EOS ABS: 0 10*3/uL (ref 0.0–0.7)
Eosinophils Relative: 0 %
HEMATOCRIT: 36.1 % — AB (ref 39.0–52.0)
HEMOGLOBIN: 12.1 g/dL — AB (ref 13.0–17.0)
LYMPHS ABS: 1 10*3/uL (ref 0.7–4.0)
Lymphocytes Relative: 15 %
MCH: 32.4 pg (ref 26.0–34.0)
MCHC: 33.5 g/dL (ref 30.0–36.0)
MCV: 96.5 fL (ref 78.0–100.0)
MONOS PCT: 6 %
Monocytes Absolute: 0.4 10*3/uL (ref 0.1–1.0)
NEUTROS ABS: 5.4 10*3/uL (ref 1.7–7.7)
NEUTROS PCT: 79 %
Platelets: 223 10*3/uL (ref 150–400)
RBC: 3.74 MIL/uL — AB (ref 4.22–5.81)
RDW: 14.7 % (ref 11.5–15.5)
WBC: 6.8 10*3/uL (ref 4.0–10.5)

## 2017-08-31 LAB — POC OCCULT BLOOD, ED: Fecal Occult Bld: POSITIVE — AB

## 2017-08-31 MED ORDER — SODIUM CHLORIDE 0.9 % IV BOLUS (SEPSIS)
500.0000 mL | Freq: Once | INTRAVENOUS | Status: AC
  Administered 2017-08-31: 500 mL via INTRAVENOUS

## 2017-08-31 MED ORDER — SODIUM CHLORIDE 0.9 % IV BOLUS (SEPSIS)
1000.0000 mL | Freq: Once | INTRAVENOUS | Status: AC
  Administered 2017-08-31: 1000 mL via INTRAVENOUS

## 2017-08-31 NOTE — ED Notes (Signed)
Called report and Ems for transport back to [facility]

## 2017-08-31 NOTE — ED Notes (Signed)
Pt dressed and dried for EMS to pick up.

## 2017-08-31 NOTE — ED Triage Notes (Signed)
Per [facility], pt is a resident of [facility].  Staff reports pt had blood in his diaper this am.  Unable to tell where it was coming from.  Reports pt confused.  Staff was not familiar with pt so they didn't know if it was normal for pt to be confused.  Pt alert, denies complaints.

## 2017-08-31 NOTE — ED Provider Notes (Signed)
[facility] EMERGENCY DEPARTMENT Provider Note   CSN: [id number] Arrival date & time: 08/31/17  0940     History   Chief Complaint Chief Complaint  Patient presents with  . Blood in diaper    HPI [name] is a 61 y.o. male.  Patient was sent over from the nursing home with possible blood in his diaper   The history is provided by the nursing home. No language interpreter was used.  Rectal Bleeding  Quality:  Bright red Amount:  Scant Timing:  Rare Chronicity:  New Context: not anal fissures   Similar prior episodes: no   Relieved by:  Nothing Worsened by:  Nothing Ineffective treatments:  None tried Associated symptoms: no abdominal pain     Past Medical History:  Diagnosis Date  . Anemia   . Anginal pain ([place])   . Collapse of lung 2018  . Decubitus skin ulcer since 2016-17   bilateral heels   . Depression   . Diabetes mellitus without complication ([place])   . Discontinued smoking   . Hypertension   . Metabolic encephalopathy 2595  . Peripheral vascular disease ([place])   . Renal failure (ARF), acute on chronic (HCC)   . Schizophrenia ([place])   . Sepsis ([place]) 2017  . Stroke ([place])   . Thrombocythemia ([place]) 2017    Patient Active Problem List   Diagnosis Date Noted  . Leukopenia 05/05/2017  . Protein-calorie malnutrition, severe 01/16/2017  . Acute hypernatremia 01/14/2017  . Collapse of lung   . Mucus plugging of bronchi   . Acute on chronic respiratory failure ([place])   . Pressure ulcer 02/04/2016  . Right lower lobe pneumonia ([place]) 02/01/2016  . Septic shock ([place]) 02/01/2016  . Hypernatremia 02/01/2016  . Acute renal failure (ARF) ([place]) 02/01/2016  . Lactic acidosis 02/01/2016  . Metabolic encephalopathy 63/87/5643  . Thrombocytopenia ([place]) 02/01/2016  . Anemia 02/01/2016  . Sepsis ([place]) 02/01/2016    Past Surgical History:  Procedure Laterality Date  . PERIPHERAL VASCULAR CATHETERIZATION Left 05/26/2016   Procedure: Lower Extremity  Angiography;  Surgeon: Algernon Huxley, MD;  Location: [place] [facility];  Service: Cardiovascular;  Laterality: Left;  . PERIPHERAL VASCULAR CATHETERIZATION  05/26/2016   Procedure: Lower Extremity Intervention;  Surgeon: Algernon Huxley, MD;  Location: [place] [facility];  Service: Cardiovascular;;  . PERIPHERAL VASCULAR CATHETERIZATION Right 06/05/2016   Procedure: Lower Extremity Angiography;  Surgeon: Algernon Huxley, MD;  Location: [place] [facility];  Service: Cardiovascular;  Laterality: Right;  . periphervascular procedures         Home Medications    Prior to Admission medications   Medication Sig Start Date End Date Taking? Authorizing Provider  aspirin EC 81 MG tablet Take 1 tablet (81 mg total) by mouth daily. 05/26/16  Yes Dew, Erskine Squibb, MD  atorvastatin (LIPITOR) 10 MG tablet Take 1 tablet (10 mg total) by mouth daily. 05/26/16  Yes Dew, Erskine Squibb, MD  clopidogrel (PLAVIX) 75 MG tablet TAKE 1 TABLET BY MOUTH ONCE DAILY. 06/24/17  Yes Dew, Erskine Squibb, MD  docusate sodium (COLACE) 100 MG capsule Take 1 capsule (100 mg total) by mouth 2 (two) times daily. 01/19/17  Yes [place] Lighter, MD  feeding supplement, ENSURE ENLIVE, (ENSURE ENLIVE) LIQD Take 237 mLs by mouth 2 (two) times daily between meals. 01/19/17  Yes [place] Lighter, MD  ferrous sulfate 325 (65 FE) MG tablet Take 1 tablet (325 mg total) by mouth 2 (two) times daily with a meal.  01/19/17  Yes [place] Lighter, MD  ipratropium-albuterol (DUONEB) 0.5-2.5 (3) MG/3ML SOLN Take 3 mLs by nebulization every 4 (four) hours as needed. 02/14/16  Yes Colleen Can, MD  levETIRAcetam (KEPPRA) 100 MG/ML solution Take 5 mLs (500 mg total) by mouth 2 (two) times daily. 02/14/16  Yes Colleen Can, MD  LORazepam (ATIVAN) 0.5 MG tablet Take 1 tablet (0.5 mg total) by mouth 2 (two) times daily as needed (anxiety or agitation). 01/19/17  Yes [place] Lighter, MD  midodrine (PROAMATINE) 5 MG tablet Take 1 tablet (5 mg total) by mouth 2  (two) times daily with a meal. 02/14/16  Yes Colleen Can, MD  sertraline (ZOLOFT) 50 MG tablet Take 1 tablet (50 mg total) by mouth at bedtime. 02/13/16  Yes Bettey Costa, MD  Skin Protectants, Misc. (EUCERIN) cream Apply 1 application topically at bedtime. Applied to Sales executive, Historical, MD    Family History Family History  Problem Relation Age of Onset  . Family history unknown: Yes    Social History Social History  Substance Use Topics  . Smoking status: Former Smoker    Packs/day: 1.00    Years: 30.00    Types: Cigarettes  . Smokeless tobacco: Never Used  . Alcohol use No     Comment:  pt unable to respond. Hx of alcohol abuse (per pt )     Allergies   Patient has no known allergies.   Review of Systems Review of Systems  Unable to perform ROS: Dementia  Gastrointestinal: Positive for hematochezia. Negative for abdominal pain.     Physical Exam Updated Vital Signs BP 111/82   Pulse (!) 52   Temp 97.7 F (36.5 C) (Oral)   Resp 17   Wt 61.2 kg (135 lb)   SpO2 100%   BMI 19.37 kg/m   Physical Exam  Constitutional: He appears well-developed.  HENT:  Head: Normocephalic.  Eyes: Conjunctivae and EOM are normal. No scleral icterus.  Neck: Neck supple. No thyromegaly present.  Cardiovascular: Normal rate and regular rhythm.  Exam reveals no gallop and no friction rub.   No murmur heard. Pulmonary/Chest: No stridor. He has no wheezes. He has no rales. He exhibits no tenderness.  Abdominal: He exhibits no distension. There is no tenderness. There is no rebound.  Musculoskeletal: He exhibits no edema.  Patient has contractures of his right upper extremity  Lymphadenopathy:    He has no cervical adenopathy.  Neurological: He is alert. He exhibits normal muscle tone. Coordination normal.  Patient cannot communicate secondary to stroke  Skin: No rash noted. No erythema.  Psychiatric: He has a normal mood and affect. His behavior is normal.     ED  Treatments / Results  Labs (all labs ordered are listed, but only abnormal results are displayed) Labs Reviewed  CBC WITH DIFFERENTIAL/PLATELET - Abnormal; Notable for the following:       Result Value   RBC 3.74 (*)    Hemoglobin 12.1 (*)    HCT 36.1 (*)    All other components within normal limits  COMPREHENSIVE METABOLIC PANEL - Abnormal; Notable for the following:    Sodium 149 (*)    Chloride 113 (*)    Glucose, Bld 159 (*)    BUN 23 (*)    All other components within normal limits  POC OCCULT BLOOD, ED - Abnormal; Notable for the following:    Fecal Occult Bld POSITIVE (*)    All other components within normal limits  EKG  EKG Interpretation  Date/Time:  Monday August 31 2017 09:53:17 EDT Ventricular Rate:  77 PR Interval:    QRS Duration: 136 QT Interval:  446 QTC Calculation: 505 R Axis:   54 Text Interpretation:  Sinus rhythm Prolonged PR interval LVH with secondary repolarization abnormality Inferior infarct, old Anterior infarct, old Prolonged QT interval Confirmed by Milton Ferguson [phone]) on 08/31/2017 12:42:27 PM       Radiology No results found.  Procedures Procedures (including critical care time)  Medications Ordered in ED Medications  sodium chloride 0.9 % bolus 500 mL (500 mLs Intravenous New Bag/Given 08/31/17 1230)  sodium chloride 0.9 % bolus 1,000 mL (1,000 mLs Intravenous New Bag/Given 08/31/17 1256)     Initial Impression / Assessment and Plan / ED Course  I have reviewed the triage vital signs and the nursing notes.  Pertinent labs & imaging results that were available during my care of the patient were reviewed by me and considered in my medical decision making (see chart for details).     Patient was dehydrated and was given some fluids.  He also had a heme positive stool but brown stool.  Hemoglobin was slightly low at 12.1.  Patient will follow up with his primary care doctor for his dehydration and heme positive stool Final  Clinical Impressions(s) / ED Diagnoses   Final diagnoses:  Dehydration    New Prescriptions New Prescriptions   No medications on file     Milton Ferguson, MD 08/31/17 1434

## 2017-08-31 NOTE — ED Notes (Signed)
Pt drinking coffee

## 2017-08-31 NOTE — Discharge Instructions (Signed)
Drink more fluids.   Have your doctor monitor the blood in your stool

## 2017-08-31 NOTE — ED Notes (Signed)
Called [facility] for transport back to [facility]

## 2017-08-31 NOTE — ED Notes (Signed)
Report called to facility, and given to EMS, given discharge instruction, verbalized understand. IV removed, band aid applied. Patient loaded on EMS stretcher and out of the department.

## 2017-08-31 NOTE — ED Notes (Signed)
Pt undressed, bloody mucus in diaper, no stool or urine. Pt cleaned, assisted EDP with rectal exam, Pos for blood.

## 2017-08-31 NOTE — ED Notes (Signed)
Pt drinking sprite, ok with EDP

## 2017-08-31 NOTE — ED Notes (Signed)
Pt oriented to name only, baseline per EMS

## 2017-09-16 ENCOUNTER — Other Ambulatory Visit (INDEPENDENT_AMBULATORY_CARE_PROVIDER_SITE_OTHER): Payer: Self-pay | Admitting: Vascular Surgery

## 2017-10-07 ENCOUNTER — Telehealth: Payer: Self-pay | Admitting: Oncology

## 2017-10-07 NOTE — Telephone Encounter (Signed)
Lab/MD appt [facility], per Provider on PAL. Appt conf with patient caregiver/Sequana W. MF

## 2017-12-15 ENCOUNTER — Other Ambulatory Visit: Payer: Medicare Other

## 2017-12-15 ENCOUNTER — Ambulatory Visit: Payer: Medicare Other | Admitting: Oncology

## 2017-12-18 ENCOUNTER — Inpatient Hospital Stay: Payer: Medicare Other

## 2017-12-18 ENCOUNTER — Inpatient Hospital Stay: Payer: Medicare Other | Admitting: Oncology

## 2017-12-29 ENCOUNTER — Encounter: Payer: Self-pay | Admitting: Oncology

## 2017-12-29 ENCOUNTER — Inpatient Hospital Stay (HOSPITAL_BASED_OUTPATIENT_CLINIC_OR_DEPARTMENT_OTHER): Payer: Medicare Other | Admitting: Oncology

## 2017-12-29 ENCOUNTER — Inpatient Hospital Stay: Payer: Medicare Other | Attending: Oncology

## 2017-12-29 VITALS — BP 103/72 | HR 60 | Temp 97.7°F | Resp 18 | Ht 70.0 in | Wt 131.7 lb

## 2017-12-29 DIAGNOSIS — D72819 Decreased white blood cell count, unspecified: Secondary | ICD-10-CM | POA: Diagnosis not present

## 2017-12-29 DIAGNOSIS — F209 Schizophrenia, unspecified: Secondary | ICD-10-CM | POA: Diagnosis not present

## 2017-12-29 DIAGNOSIS — D7589 Other specified diseases of blood and blood-forming organs: Secondary | ICD-10-CM | POA: Insufficient documentation

## 2017-12-29 DIAGNOSIS — D539 Nutritional anemia, unspecified: Secondary | ICD-10-CM | POA: Diagnosis not present

## 2017-12-29 DIAGNOSIS — D61818 Other pancytopenia: Secondary | ICD-10-CM

## 2017-12-29 DIAGNOSIS — D696 Thrombocytopenia, unspecified: Secondary | ICD-10-CM | POA: Insufficient documentation

## 2017-12-29 LAB — CBC WITH DIFFERENTIAL/PLATELET
Basophils Absolute: 0 10*3/uL (ref 0–0.1)
Basophils Relative: 1 %
EOS ABS: 0 10*3/uL (ref 0–0.7)
Eosinophils Relative: 1 %
HCT: 37.5 % — ABNORMAL LOW (ref 40.0–52.0)
HEMOGLOBIN: 12.5 g/dL — AB (ref 13.0–18.0)
LYMPHS ABS: 1.3 10*3/uL (ref 1.0–3.6)
Lymphocytes Relative: 43 %
MCH: 33.6 pg (ref 26.0–34.0)
MCHC: 33.4 g/dL (ref 32.0–36.0)
MCV: 100.5 fL — AB (ref 80.0–100.0)
MONOS PCT: 5 %
Monocytes Absolute: 0.1 10*3/uL — ABNORMAL LOW (ref 0.2–1.0)
NEUTROS PCT: 50 %
Neutro Abs: 1.5 10*3/uL (ref 1.4–6.5)
Platelets: 178 10*3/uL (ref 150–440)
RBC: 3.73 MIL/uL — ABNORMAL LOW (ref 4.40–5.90)
RDW: 15.2 % — ABNORMAL HIGH (ref 11.5–14.5)
WBC: 3.1 10*3/uL — ABNORMAL LOW (ref 3.8–10.6)

## 2017-12-29 NOTE — Progress Notes (Signed)
Hematology/Oncology Consult note [facility]  Telephone:([phone] [phone]  Patient Care Team: Remi Haggard, FNP as PCP - General (Family Medicine)   Name of the patient: [name]  [id number]  1956-07-17   Date of visit: 12/29/17  Diagnosis- normocytic anemia and leukopenia  Chief complaint/ Reason for visit- routine f/u of leukopenia and anemia  Heme/Onc history: patient is a 62 year old male with a past medical history significant for symptoms of any of, bipolar disorder, history of CVA with right-sided weakness and peripheral vascular disease. He is on Zoloft and Keppra for schizophreniahe has been referred to me for evaluation and management of anemia and leukopenia. Recent CBC from June 2018 showed white count of 2.8, H&H of 12.3/38 and a platelet count of 190. Recent TSH from June 2018 was normal at 1.15.  Patient lives in a group home and is a poor historian. His care givers from group home report no acute issues. He had recurrent foot infections in the past which have now healed. He has also been hospitalized on and off for sepsis/ respiratory infections  Results of bloodwork from 06/09/2017 were as follows: CBC showed white count of 3.4, H&H of 11.5/33.4 and platelet count of 280. Differential on the CBC was normal. CMP showed mild hyponatremia with a sodium of 148. Kidney functions and liver functions were normal. Iron studies B12 and folate were within normal limits. Multiple myeloma panel revealed a mildly increased IgA with apparent polyclonal gammopathy. No monoclonal gammopathy was noted. HIV and hepatitis C testing was negative. Haptoglobin was normal and so H pylori and was negative.   Interval history-patient is a poor historian but overall reports feeling at his baseline.  His group home caregivers voiced no overt concerns regarding his health at this time.  He has not had any hospitalizations or recurrent infections.   At baseline he is wheelchair-bound and unable to ambulate due to lower extremity weakness which is unchanged  ECOG PS- 2 Pain scale- 0   Review of systems- Review of Systems  Constitutional: Positive for malaise/fatigue. Negative for chills, fever and weight loss.  HENT: Negative for congestion, ear discharge and nosebleeds.   Eyes: Negative for blurred vision.  Respiratory: Negative for cough, hemoptysis, sputum production, shortness of breath and wheezing.   Cardiovascular: Negative for chest pain, palpitations, orthopnea and claudication.  Gastrointestinal: Negative for abdominal pain, blood in stool, constipation, diarrhea, heartburn, melena, nausea and vomiting.  Genitourinary: Negative for dysuria, flank pain, frequency, hematuria and urgency.  Musculoskeletal: Negative for back pain, joint pain and myalgias.  Skin: Negative for rash.  Neurological: Negative for dizziness, tingling, focal weakness, seizures, weakness and headaches.  Endo/Heme/Allergies: Does not bruise/bleed easily.  Psychiatric/Behavioral: Negative for depression and suicidal ideas. The patient does not have insomnia.        No Known Allergies   Past Medical History:  Diagnosis Date  . Anemia   . Anginal pain ([place])   . Collapse of lung 2018  . Decubitus skin ulcer since 2016-17   bilateral heels   . Depression   . Diabetes mellitus without complication ([place])   . Discontinued smoking   . Hypertension   . Metabolic encephalopathy 3149  . Peripheral vascular disease ([place])   . Renal failure (ARF), acute on chronic (HCC)   . Schizophrenia ([place])   . Sepsis ([place]) 2017  . Stroke ([place])   . Thrombocythemia ([place]) 2017     Past Surgical History:  Procedure Laterality Date  .  PERIPHERAL VASCULAR CATHETERIZATION Left 05/26/2016   Procedure: Lower Extremity Angiography;  Surgeon: Algernon Huxley, MD;  Location: [place] [facility];  Service: Cardiovascular;  Laterality: Left;  . PERIPHERAL VASCULAR  CATHETERIZATION  05/26/2016   Procedure: Lower Extremity Intervention;  Surgeon: Algernon Huxley, MD;  Location: [place] [facility];  Service: Cardiovascular;;  . PERIPHERAL VASCULAR CATHETERIZATION Right 06/05/2016   Procedure: Lower Extremity Angiography;  Surgeon: Algernon Huxley, MD;  Location: [place] [facility];  Service: Cardiovascular;  Laterality: Right;  . periphervascular procedures      Social History   Socioeconomic History  . Marital status: Single    Spouse name: Not on file  . Number of children: Not on file  . Years of education: Not on file  . Highest education level: Not on file  Social Needs  . Financial resource strain: Not on file  . Food insecurity - worry: Not on file  . Food insecurity - inability: Not on file  . Transportation needs - medical: Not on file  . Transportation needs - non-medical: Not on file  Occupational History  . Not on file  Tobacco Use  . Smoking status: Former Smoker    Packs/day: 1.00    Years: 30.00    Pack years: 30.00    Types: Cigarettes  . Smokeless tobacco: Never Used  Substance and Sexual Activity  . Alcohol use: No    Alcohol/week: 0.0 oz    Comment:  pt unable to respond. Hx of alcohol abuse (per pt )  . Drug use: No    Comment: unknown pt UTA  . Sexual activity: Not Currently  Other Topics Concern  . Not on file  Social History Narrative  . Not on file    Family History  Family history unknown: Yes     Current Outpatient Medications:  .  atorvastatin (LIPITOR) 10 MG tablet, Take 1 tablet (10 mg total) by mouth daily., Disp: 30 tablet, Rfl: 5 .  clopidogrel (PLAVIX) 75 MG tablet, TAKE 1 TABLET BY MOUTH ONCE DAILY., Disp: 28 tablet, Rfl: 0 .  docusate sodium (COLACE) 100 MG capsule, Take 1 capsule (100 mg total) by mouth 2 (two) times daily., Disp: 10 capsule, Rfl: 0 .  feeding supplement, ENSURE ENLIVE, (ENSURE ENLIVE) LIQD, Take 237 mLs by mouth 2 (two) times daily between meals., Disp: 237 mL, Rfl: 12 .  ferrous  sulfate 325 (65 FE) MG tablet, Take 1 tablet (325 mg total) by mouth 2 (two) times daily with a meal., Disp: 60 tablet, Rfl: 3 .  folic acid (FOLVITE) 1 MG tablet, Take 1 mg by mouth daily., Disp: , Rfl:  .  GNP ASPIRIN LOW DOSE 81 MG EC tablet, TAKE 1 TABLET BY MOUTH ONCE DAILY., Disp: 30 tablet, Rfl: 6 .  levETIRAcetam (KEPPRA) 100 MG/ML solution, Take 5 mLs (500 mg total) by mouth 2 (two) times daily., Disp: 473 mL, Rfl: 0 .  LORazepam (ATIVAN) 0.5 MG tablet, Take 1 tablet (0.5 mg total) by mouth 2 (two) times daily as needed (anxiety or agitation)., Disp: 30 tablet, Rfl: 0 .  midodrine (PROAMATINE) 5 MG tablet, Take 1 tablet (5 mg total) by mouth 2 (two) times daily with a meal., Disp: 30 tablet, Rfl: 0 .  sertraline (ZOLOFT) 50 MG tablet, Take 1 tablet (50 mg total) by mouth at bedtime., Disp: 30 tablet, Rfl: 0 .  Skin Protectants, Misc. (EUCERIN) cream, Apply 1 application topically at bedtime. Applied to feet, Disp: , Rfl:  .  Thiamine HCl (VITAMIN B-1 PO), Take 1 capsule by mouth daily., Disp: , Rfl:  .  ipratropium-albuterol (DUONEB) 0.5-2.5 (3) MG/3ML SOLN, Take 3 mLs by nebulization every 4 (four) hours as needed., Disp: 30 mL, Rfl: 0  Physical exam:  Vitals:   12/29/17 1400  BP: 103/72  Pulse: 60  Resp: 18  Temp: 97.7 F (36.5 C)  TempSrc: Oral  Weight: 131 lb 11.2 oz (59.7 kg)  Height: _0  (1.778 m)   Physical Exam  Constitutional:  Thin African-American male sitting in a wheelchair and slumped forward.  He does not appear to be in any acute distress  HENT:  Head: Normocephalic and atraumatic.  Eyes: EOM are normal. Pupils are equal, round, and reactive to light.  Neck: Normal range of motion.  Cardiovascular: Normal rate, regular rhythm and normal heart sounds.  Pulmonary/Chest: Effort normal and breath sounds normal.  Abdominal: Soft. Bowel sounds are normal.  No palpable splenomegaly  Lymphadenopathy:  No palpable adenopathy  Neurological: He is alert.  Skin:  Skin is warm and dry.     CMP Latest Ref Rng & Units 08/31/2017  Glucose 65 - 99 mg/dL 159(H)  BUN 6 - 20 mg/dL 23(H)  Creatinine 0.61 - 1.24 mg/dL 1.10  Sodium 135 - 145 mmol/L 149(H)  Potassium 3.5 - 5.1 mmol/L 4.5  Chloride 101 - 111 mmol/L 113(H)  CO2 22 - 32 mmol/L 27  Calcium 8.9 - 10.3 mg/dL 9.6  Total Protein 6.5 - 8.1 g/dL 7.6  Total Bilirubin 0.3 - 1.2 mg/dL 0.8  Alkaline Phos 38 - 126 U/L 55  AST 15 - 41 U/L 22  ALT 17 - 63 U/L 17   CBC Latest Ref Rng & Units 12/29/2017  WBC 3.8 - 10.6 K/uL 3.1(L)  Hemoglobin 13.0 - 18.0 g/dL 12.5(L)  Hematocrit 40.0 - 52.0 % 37.5(L)  Platelets 150 - 440 K/uL 178      Assessment and plan- Patient is a 62 y.o. 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  Thrombocytopenia: Patient's platelet counts have been ranging between 87 to normal over the last couple of years.  Most recently since April 2018 his platelet counts have been normal  2.  Normocytic anemia: Patient's hemoglobin between March 2017 up until March 2018 was between 8-9.  This has significantly improved to 12.5 today.  He does have some evidence of macrocytosis with an MCV of 100.5 today.  Prior B12 and folate levels have been normal.  Continue to monitor  3.  Leukopenia: White count ranges between 3.12 normal with a normal differential.  This has been waxing and waning and there has been no consistent downward trend in his white count.  Today his white count is 3.1 with an ANC of 1.5.  Patient is overall asymptomatic  Repeat CBC with differential in 3 and 6 months and I will see him back in 6 months.  I will also check his B12 level at that time Visit Diagnosis 1. Macrocytic anemia   2. Leukopenia, unspecified type      Dr. Randa Evens, MD, MPH [facility] Pager- [phone] 12/29/2017

## 2017-12-29 NOTE — Progress Notes (Signed)
No new changes noted today 

## 2018-02-08 ENCOUNTER — Encounter: Payer: Self-pay | Admitting: Podiatry

## 2018-02-08 ENCOUNTER — Ambulatory Visit (INDEPENDENT_AMBULATORY_CARE_PROVIDER_SITE_OTHER): Payer: Medicare Other | Admitting: Podiatry

## 2018-02-08 VITALS — BP 103/72 | HR 58

## 2018-02-08 DIAGNOSIS — M79674 Pain in right toe(s): Secondary | ICD-10-CM | POA: Diagnosis not present

## 2018-02-08 DIAGNOSIS — B351 Tinea unguium: Secondary | ICD-10-CM | POA: Diagnosis not present

## 2018-02-08 DIAGNOSIS — M79675 Pain in left toe(s): Secondary | ICD-10-CM | POA: Diagnosis not present

## 2018-02-08 DIAGNOSIS — D689 Coagulation defect, unspecified: Secondary | ICD-10-CM

## 2018-02-08 DIAGNOSIS — E1159 Type 2 diabetes mellitus with other circulatory complications: Secondary | ICD-10-CM

## 2018-02-08 NOTE — Progress Notes (Signed)
This patient presents to the office with chief complaint of long thick nails and diabetic feet.  This patient  says he is having no pain and discomfort in his feet.  This patient says he has long thick painful nails.  These nails are painful walking and wearing his shoes..  This patient presents the office today for treatment of the  long nails and a foot evaluation due to history of  Diabetes. He has history of revascularization of both feet.  Patient is diabetic and taking plavix.  Previous history of decubital heel ulcers both feet.  General Appearance   Non communicative and unaware of his surroundings.  Vascular  Dorsalis pedis and posterior tibial  pulses are weakly palpable  bilaterally.  Capillary return is within normal limits  bilaterally. Temperature is within normal limits  bilaterally.  Neurologic  Deferred.  Nails Thick disfigured discolored nails with subungual debris  from hallux to fifth toes bilaterally. No evidence of bacterial infection or drainage bilaterally.  Orthopedic  No limitations of motion of motion feet .  No crepitus or effusions noted.  No bony pathology or digital deformities noted.  Skin  normotropic skin with no porokeratosis noted bilaterally.  No signs of infections or ulcers noted.     Onychomycosis  Diabetes   IE  Debride nails x 10.     RTC 3 months.   Gardiner Barefoot DPM

## 2018-03-06 ENCOUNTER — Encounter (HOSPITAL_COMMUNITY): Payer: Self-pay | Admitting: *Deleted

## 2018-03-06 ENCOUNTER — Other Ambulatory Visit: Payer: Self-pay

## 2018-03-06 ENCOUNTER — Emergency Department (HOSPITAL_COMMUNITY): Payer: Medicare Other

## 2018-03-06 ENCOUNTER — Inpatient Hospital Stay (HOSPITAL_COMMUNITY)
Admission: EM | Admit: 2018-03-06 | Discharge: 2018-03-09 | DRG: 291 | Disposition: A | Discharge status: Home Health | Payer: Medicare Other | Attending: Internal Medicine | Admitting: Internal Medicine

## 2018-03-06 DIAGNOSIS — J449 Chronic obstructive pulmonary disease, unspecified: Secondary | ICD-10-CM | POA: Diagnosis present

## 2018-03-06 DIAGNOSIS — I429 Cardiomyopathy, unspecified: Secondary | ICD-10-CM | POA: Diagnosis present

## 2018-03-06 DIAGNOSIS — D649 Anemia, unspecified: Secondary | ICD-10-CM

## 2018-03-06 DIAGNOSIS — F209 Schizophrenia, unspecified: Secondary | ICD-10-CM | POA: Diagnosis present

## 2018-03-06 DIAGNOSIS — I951 Orthostatic hypotension: Secondary | ICD-10-CM | POA: Diagnosis not present

## 2018-03-06 DIAGNOSIS — R569 Unspecified convulsions: Secondary | ICD-10-CM | POA: Diagnosis present

## 2018-03-06 DIAGNOSIS — G9341 Metabolic encephalopathy: Secondary | ICD-10-CM | POA: Diagnosis present

## 2018-03-06 DIAGNOSIS — I739 Peripheral vascular disease, unspecified: Secondary | ICD-10-CM | POA: Diagnosis not present

## 2018-03-06 DIAGNOSIS — E039 Hypothyroidism, unspecified: Secondary | ICD-10-CM | POA: Diagnosis present

## 2018-03-06 DIAGNOSIS — Z8673 Personal history of transient ischemic attack (TIA), and cerebral infarction without residual deficits: Secondary | ICD-10-CM | POA: Diagnosis not present

## 2018-03-06 DIAGNOSIS — I959 Hypotension, unspecified: Secondary | ICD-10-CM | POA: Diagnosis present

## 2018-03-06 DIAGNOSIS — I313 Pericardial effusion (noninflammatory): Secondary | ICD-10-CM | POA: Diagnosis present

## 2018-03-06 DIAGNOSIS — R195 Other fecal abnormalities: Secondary | ICD-10-CM | POA: Diagnosis present

## 2018-03-06 DIAGNOSIS — I110 Hypertensive heart disease with heart failure: Principal | ICD-10-CM | POA: Diagnosis present

## 2018-03-06 DIAGNOSIS — I2510 Atherosclerotic heart disease of native coronary artery without angina pectoris: Secondary | ICD-10-CM | POA: Diagnosis present

## 2018-03-06 DIAGNOSIS — F329 Major depressive disorder, single episode, unspecified: Secondary | ICD-10-CM | POA: Diagnosis present

## 2018-03-06 DIAGNOSIS — I25118 Atherosclerotic heart disease of native coronary artery with other forms of angina pectoris: Secondary | ICD-10-CM | POA: Diagnosis not present

## 2018-03-06 DIAGNOSIS — Z7902 Long term (current) use of antithrombotics/antiplatelets: Secondary | ICD-10-CM

## 2018-03-06 DIAGNOSIS — R197 Diarrhea, unspecified: Secondary | ICD-10-CM | POA: Diagnosis present

## 2018-03-06 DIAGNOSIS — E079 Disorder of thyroid, unspecified: Secondary | ICD-10-CM | POA: Diagnosis present

## 2018-03-06 DIAGNOSIS — Z8249 Family history of ischemic heart disease and other diseases of the circulatory system: Secondary | ICD-10-CM

## 2018-03-06 DIAGNOSIS — E43 Unspecified severe protein-calorie malnutrition: Secondary | ICD-10-CM | POA: Diagnosis present

## 2018-03-06 DIAGNOSIS — E11621 Type 2 diabetes mellitus with foot ulcer: Secondary | ICD-10-CM | POA: Diagnosis present

## 2018-03-06 DIAGNOSIS — R778 Other specified abnormalities of plasma proteins: Secondary | ICD-10-CM | POA: Diagnosis present

## 2018-03-06 DIAGNOSIS — Z9861 Coronary angioplasty status: Secondary | ICD-10-CM

## 2018-03-06 DIAGNOSIS — Z681 Body mass index (BMI) 19 or less, adult: Secondary | ICD-10-CM | POA: Diagnosis not present

## 2018-03-06 DIAGNOSIS — F17210 Nicotine dependence, cigarettes, uncomplicated: Secondary | ICD-10-CM | POA: Diagnosis present

## 2018-03-06 DIAGNOSIS — I5023 Acute on chronic systolic (congestive) heart failure: Secondary | ICD-10-CM | POA: Diagnosis present

## 2018-03-06 DIAGNOSIS — Z7982 Long term (current) use of aspirin: Secondary | ICD-10-CM

## 2018-03-06 DIAGNOSIS — R7989 Other specified abnormal findings of blood chemistry: Secondary | ICD-10-CM | POA: Diagnosis present

## 2018-03-06 DIAGNOSIS — R0602 Shortness of breath: Secondary | ICD-10-CM | POA: Diagnosis not present

## 2018-03-06 DIAGNOSIS — D638 Anemia in other chronic diseases classified elsewhere: Secondary | ICD-10-CM | POA: Diagnosis present

## 2018-03-06 DIAGNOSIS — E119 Type 2 diabetes mellitus without complications: Secondary | ICD-10-CM

## 2018-03-06 DIAGNOSIS — Z95810 Presence of automatic (implantable) cardiac defibrillator: Secondary | ICD-10-CM

## 2018-03-06 DIAGNOSIS — E1151 Type 2 diabetes mellitus with diabetic peripheral angiopathy without gangrene: Secondary | ICD-10-CM | POA: Diagnosis present

## 2018-03-06 DIAGNOSIS — I361 Nonrheumatic tricuspid (valve) insufficiency: Secondary | ICD-10-CM | POA: Diagnosis not present

## 2018-03-06 DIAGNOSIS — D61818 Other pancytopenia: Secondary | ICD-10-CM | POA: Diagnosis not present

## 2018-03-06 DIAGNOSIS — Z66 Do not resuscitate: Secondary | ICD-10-CM | POA: Diagnosis present

## 2018-03-06 DIAGNOSIS — Z79899 Other long term (current) drug therapy: Secondary | ICD-10-CM

## 2018-03-06 DIAGNOSIS — I519 Heart disease, unspecified: Secondary | ICD-10-CM | POA: Diagnosis not present

## 2018-03-06 DIAGNOSIS — I509 Heart failure, unspecified: Secondary | ICD-10-CM

## 2018-03-06 DIAGNOSIS — R748 Abnormal levels of other serum enzymes: Secondary | ICD-10-CM | POA: Diagnosis not present

## 2018-03-06 DIAGNOSIS — Z833 Family history of diabetes mellitus: Secondary | ICD-10-CM

## 2018-03-06 DIAGNOSIS — Z993 Dependence on wheelchair: Secondary | ICD-10-CM

## 2018-03-06 DIAGNOSIS — I9589 Other hypotension: Secondary | ICD-10-CM | POA: Diagnosis not present

## 2018-03-06 HISTORY — DX: Disorder of thyroid, unspecified: E07.9

## 2018-03-06 LAB — PROTIME-INR
INR: 1.19
Prothrombin Time: 15 seconds (ref 11.4–15.2)

## 2018-03-06 LAB — COMPREHENSIVE METABOLIC PANEL
ALBUMIN: 3.4 g/dL — AB (ref 3.5–5.0)
ALT: 21 U/L (ref 17–63)
ANION GAP: 10 (ref 5–15)
AST: 19 U/L (ref 15–41)
Alkaline Phosphatase: 54 U/L (ref 38–126)
BILIRUBIN TOTAL: 0.5 mg/dL (ref 0.3–1.2)
BUN: 20 mg/dL (ref 6–20)
CALCIUM: 9 mg/dL (ref 8.9–10.3)
CO2: 26 mmol/L (ref 22–32)
Chloride: 110 mmol/L (ref 101–111)
Creatinine, Ser: 1.04 mg/dL (ref 0.61–1.24)
GFR calc Af Amer: >60 mL/min (ref >60)
GFR calc non Af Amer: >60 mL/min (ref >60)
GLUCOSE: 124 mg/dL — AB (ref 65–99)
Potassium: 4.7 mmol/L (ref 3.5–5.1)
Sodium: 146 mmol/L — ABNORMAL HIGH (ref 135–145)
TOTAL PROTEIN: 6.5 g/dL (ref 6.5–8.1)

## 2018-03-06 LAB — CBC
HEMATOCRIT: 31.8 % — AB (ref 39.0–52.0)
HEMOGLOBIN: 10.2 g/dL — AB (ref 13.0–17.0)
MCH: 32.5 pg (ref 26.0–34.0)
MCHC: 32.1 g/dL (ref 30.0–36.0)
MCV: 101.3 fL — ABNORMAL HIGH (ref 78.0–100.0)
Platelets: 246 10*3/uL (ref 150–400)
RBC: 3.14 MIL/uL — ABNORMAL LOW (ref 4.22–5.81)
RDW: 14.3 % (ref 11.5–15.5)
WBC: 3.9 10*3/uL — ABNORMAL LOW (ref 4.0–10.5)

## 2018-03-06 LAB — MRSA PCR SCREENING: MRSA by PCR: NEGATIVE

## 2018-03-06 LAB — D-DIMER, QUANTITATIVE (NOT AT ARMC): D DIMER QUANT: 0.6 ug{FEU}/mL — AB (ref 0.00–0.50)

## 2018-03-06 LAB — SAMPLE TO BLOOD BANK

## 2018-03-06 LAB — GLUCOSE, CAPILLARY
GLUCOSE-CAPILLARY: 94 mg/dL (ref 65–99)
Glucose-Capillary: 80 mg/dL (ref 65–99)

## 2018-03-06 LAB — POC OCCULT BLOOD, ED: FECAL OCCULT BLD: NEGATIVE

## 2018-03-06 LAB — BRAIN NATRIURETIC PEPTIDE: B Natriuretic Peptide: 2749 pg/mL — ABNORMAL HIGH (ref 0.0–100.0)

## 2018-03-06 LAB — TROPONIN I: Troponin I: 0.05 ng/mL (ref <0.03)

## 2018-03-06 MED ORDER — FUROSEMIDE 10 MG/ML IJ SOLN
40.0000 mg | Freq: Once | INTRAMUSCULAR | Status: AC
  Administered 2018-03-06: 40 mg via INTRAVENOUS
  Filled 2018-03-06: qty 4

## 2018-03-06 MED ORDER — SODIUM CHLORIDE 0.9% FLUSH
3.0000 mL | Freq: Two times a day (BID) | INTRAVENOUS | Status: DC
  Administered 2018-03-06 – 2018-03-09 (×5): 3 mL via INTRAVENOUS

## 2018-03-06 MED ORDER — VITAMIN D 1000 UNITS PO TABS
5000.0000 [IU] | ORAL_TABLET | Freq: Every day | ORAL | Status: DC
  Administered 2018-03-07 – 2018-03-09 (×3): 5000 [IU] via ORAL
  Filled 2018-03-06 (×3): qty 5

## 2018-03-06 MED ORDER — OMEGA-3-ACID ETHYL ESTERS 1 G PO CAPS
2.0000 | ORAL_CAPSULE | Freq: Two times a day (BID) | ORAL | Status: DC
  Administered 2018-03-06 – 2018-03-09 (×4): 2 g via ORAL
  Filled 2018-03-06 (×5): qty 2

## 2018-03-06 MED ORDER — INSULIN ASPART 100 UNIT/ML ~~LOC~~ SOLN: 0.0000 [IU] | Freq: Every day | SUBCUTANEOUS | Status: DC

## 2018-03-06 MED ORDER — SODIUM CHLORIDE 0.9% FLUSH: 3.0000 mL | INTRAVENOUS | Status: DC | PRN

## 2018-03-06 MED ORDER — ENOXAPARIN SODIUM 40 MG/0.4ML ~~LOC~~ SOLN
40.0000 mg | SUBCUTANEOUS | Status: DC
  Administered 2018-03-06 – 2018-03-08 (×3): 40 mg via SUBCUTANEOUS
  Filled 2018-03-06 (×4): qty 0.4

## 2018-03-06 MED ORDER — POTASSIUM CHLORIDE ER 10 MEQ PO TBCR
10.0000 meq | EXTENDED_RELEASE_TABLET | Freq: Every day | ORAL | Status: DC
  Administered 2018-03-07 – 2018-03-09 (×3): 10 meq via ORAL
  Filled 2018-03-06 (×8): qty 1

## 2018-03-06 MED ORDER — ONDANSETRON HCL 4 MG/2ML IJ SOLN: 4.0000 mg | Freq: Four times a day (QID) | INTRAMUSCULAR | Status: DC | PRN

## 2018-03-06 MED ORDER — MELATONIN 5 MG PO TABS
1.0000 | ORAL_TABLET | Freq: Every day | ORAL | Status: DC
  Filled 2018-03-06 (×2): qty 1

## 2018-03-06 MED ORDER — LEVOTHYROXINE SODIUM 50 MCG PO TABS
50.0000 ug | ORAL_TABLET | Freq: Every day | ORAL | Status: DC
  Administered 2018-03-07 – 2018-03-09 (×3): 50 ug via ORAL
  Filled 2018-03-06 (×3): qty 1

## 2018-03-06 MED ORDER — FUROSEMIDE 10 MG/ML IJ SOLN
40.0000 mg | Freq: Two times a day (BID) | INTRAMUSCULAR | Status: DC
  Administered 2018-03-06: 40 mg via INTRAVENOUS
  Filled 2018-03-06 (×3): qty 4

## 2018-03-06 MED ORDER — ACETAMINOPHEN 325 MG PO TABS: 650.0000 mg | ORAL_TABLET | ORAL | Status: DC | PRN

## 2018-03-06 MED ORDER — SODIUM CHLORIDE 0.9 % IV SOLN: 250.0000 mL | INTRAVENOUS | Status: DC | PRN

## 2018-03-06 MED ORDER — ASPIRIN 81 MG PO CHEW
81.0000 mg | CHEWABLE_TABLET | Freq: Every day | ORAL | Status: DC
  Administered 2018-03-06 – 2018-03-09 (×4): 81 mg via ORAL
  Filled 2018-03-06 (×4): qty 1

## 2018-03-06 MED ORDER — ATORVASTATIN CALCIUM 10 MG PO TABS
10.0000 mg | ORAL_TABLET | Freq: Every day | ORAL | Status: DC
  Administered 2018-03-06 – 2018-03-08 (×3): 10 mg via ORAL
  Filled 2018-03-06 (×3): qty 1

## 2018-03-06 MED ORDER — TRAZODONE HCL 50 MG PO TABS
50.0000 mg | ORAL_TABLET | Freq: Once | ORAL | Status: AC
Start: 2018-03-06 — End: 2018-03-06
  Administered 2018-03-06: 50 mg via ORAL
  Filled 2018-03-06: qty 1

## 2018-03-06 MED ORDER — LEVETIRACETAM 500 MG PO TABS
500.0000 mg | ORAL_TABLET | Freq: Two times a day (BID) | ORAL | Status: DC
  Administered 2018-03-06 – 2018-03-09 (×7): 500 mg via ORAL
  Filled 2018-03-06 (×7): qty 1

## 2018-03-06 MED ORDER — TOPIRAMATE 100 MG PO TABS
100.0000 mg | ORAL_TABLET | Freq: Every day | ORAL | Status: DC
  Administered 2018-03-07 – 2018-03-09 (×3): 100 mg via ORAL
  Filled 2018-03-06 (×4): qty 1

## 2018-03-06 MED ORDER — INSULIN ASPART 100 UNIT/ML ~~LOC~~ SOLN
0.0000 [IU] | Freq: Three times a day (TID) | SUBCUTANEOUS | Status: DC
  Administered 2018-03-07 (×2): 2 [IU] via SUBCUTANEOUS

## 2018-03-06 MED ORDER — CLOPIDOGREL BISULFATE 75 MG PO TABS
75.0000 mg | ORAL_TABLET | Freq: Every day | ORAL | Status: DC
  Administered 2018-03-06 – 2018-03-09 (×4): 75 mg via ORAL
  Filled 2018-03-06 (×4): qty 1

## 2018-03-06 MED ORDER — COLCHICINE 0.6 MG PO TABS
0.6000 mg | ORAL_TABLET | Freq: Two times a day (BID) | ORAL | Status: DC
  Administered 2018-03-06 – 2018-03-09 (×6): 0.6 mg via ORAL
  Filled 2018-03-06 (×6): qty 1

## 2018-03-06 NOTE — ED Provider Notes (Signed)
[facility] EMERGENCY DEPARTMENT Provider Note   CSN: [id number] Arrival date & time: 03/06/18  1031     History   Chief Complaint Chief Complaint  Patient presents with  . Shortness of Breath    HPI [name] is a 62 y.o. male.  HPI Patient presents to the emergency room for evaluation of shortness of breath.  Patient states he started feeling short of breath this morning.  He has not been coughing.  He does not have any chest pain.  He does not have any swelling.  He noticed that when he tried to get up and move around he became short of breath.  Patient resides at [facility].  They called EMS this morning and EMS noted his oxygen saturation was 86% on room air.  It has improved on supplemental oxygen.  Patient also has been having black tarry stools for the last 2 days according to the care home.  Patient denies any trouble with abdominal pain.  He denies any nausea or vomiting.  No fevers or chills. Past Medical History:  Diagnosis Date  . Anemia   . Anginal pain ([place])   . Collapse of lung 2018  . Decubitus skin ulcer since 2016-17   bilateral heels   . Depression   . Diabetes mellitus without complication ([place])   . Discontinued smoking   . Hypertension   . Metabolic encephalopathy 9767  . Peripheral vascular disease ([place])   . Renal failure (ARF), acute on chronic (HCC)   . Schizophrenia ([place])   . Sepsis ([place]) 2017  . Stroke ([place])   . Thrombocythemia ([place]) 2017  . Thyroid disease     Patient Active Problem List   Diagnosis Date Noted  . Leukopenia 05/05/2017  . Protein-calorie malnutrition, severe 01/16/2017  . Acute hypernatremia 01/14/2017  . Collapse of lung   . Mucus plugging of bronchi   . Acute on chronic respiratory failure ([place])   . Pressure ulcer 02/04/2016  . Right lower lobe pneumonia ([place]) 02/01/2016  . Septic shock ([place]) 02/01/2016  . Hypernatremia 02/01/2016  . Acute renal failure (ARF) ([place]) 02/01/2016  . Lactic acidosis  02/01/2016  . Metabolic encephalopathy 34/19/3790  . Thrombocytopenia ([place]) 02/01/2016  . Anemia 02/01/2016  . Sepsis ([place]) 02/01/2016    Past Surgical History:  Procedure Laterality Date  . PERIPHERAL VASCULAR CATHETERIZATION Left 05/26/2016   Procedure: Lower Extremity Angiography;  Surgeon: Algernon Huxley, MD;  Location: [place] [facility];  Service: Cardiovascular;  Laterality: Left;  . PERIPHERAL VASCULAR CATHETERIZATION  05/26/2016   Procedure: Lower Extremity Intervention;  Surgeon: Algernon Huxley, MD;  Location: [place] [facility];  Service: Cardiovascular;;  . PERIPHERAL VASCULAR CATHETERIZATION Right 06/05/2016   Procedure: Lower Extremity Angiography;  Surgeon: Algernon Huxley, MD;  Location: [place] [facility];  Service: Cardiovascular;  Laterality: Right;  . periphervascular procedures          Home Medications    Prior to Admission medications   Medication Sig Start Date End Date Taking? Authorizing Provider  atorvastatin (LIPITOR) 10 MG tablet Take 1 tablet (10 mg total) by mouth daily. 05/26/16   Algernon Huxley, MD  clopidogrel (PLAVIX) 75 MG tablet TAKE 1 TABLET BY MOUTH ONCE DAILY. 06/24/17   Algernon Huxley, MD  docusate sodium (COLACE) 100 MG capsule Take 1 capsule (100 mg total) by mouth 2 (two) times daily. 01/19/17   Gladstone Lighter, MD  feeding supplement, ENSURE ENLIVE, (ENSURE ENLIVE) LIQD Take 237  mLs by mouth 2 (two) times daily between meals. 01/19/17   Gladstone Lighter, MD  ferrous sulfate 325 (65 FE) MG tablet Take 1 tablet (325 mg total) by mouth 2 (two) times daily with a meal. 01/19/17   Gladstone Lighter, MD  folic acid (FOLVITE) 1 MG tablet Take 1 mg by mouth daily.    [provider]  GNP ASPIRIN LOW DOSE 81 MG EC tablet TAKE 1 TABLET BY MOUTH ONCE DAILY. 09/16/17   Algernon Huxley, MD  ipratropium-albuterol (DUONEB) 0.5-2.5 (3) MG/3ML SOLN Take 3 mLs by nebulization every 4 (four) hours as needed. 02/14/16   Colleen Can, MD  levETIRAcetam  (KEPPRA) 100 MG/ML solution Take 5 mLs (500 mg total) by mouth 2 (two) times daily. 02/14/16   Colleen Can, MD  LORazepam (ATIVAN) 0.5 MG tablet Take 1 tablet (0.5 mg total) by mouth 2 (two) times daily as needed (anxiety or agitation). 01/19/17   Gladstone Lighter, MD  midodrine (PROAMATINE) 5 MG tablet Take 1 tablet (5 mg total) by mouth 2 (two) times daily with a meal. 02/14/16   Colleen Can, MD  sertraline (ZOLOFT) 50 MG tablet Take 1 tablet (50 mg total) by mouth at bedtime. 02/13/16   Bettey Costa, MD  Skin Protectants, Misc. (EUCERIN) cream Apply 1 application topically at bedtime. Applied to feet    [provider]  Thiamine HCl (VITAMIN B-1 PO) Take 1 capsule by mouth daily.    [provider]    Family History Family History  Family history unknown: Yes    Social History Social History   Tobacco Use  . Smoking status: Current Every Day Smoker    Packs/day: 1.00    Years: 30.00    Pack years: 30.00    Types: Cigarettes  . Smokeless tobacco: Never Used  Substance Use Topics  . Alcohol use: No    Alcohol/week: 0.0 oz    Comment:  pt unable to respond. Hx of alcohol abuse (per pt )  . Drug use: No    Comment: unknown pt UTA     Allergies   Patient has no known allergies.   Review of Systems Review of Systems  All other systems reviewed and are negative.    Physical Exam Updated Vital Signs BP 97/73   Pulse 72   Temp (!) 97.5 F (36.4 C) (Oral)   Resp 14   Ht 1.778 m (5\' 10" )   Wt 59.4 kg (131 lb)   SpO2 98%   BMI 18.80 kg/m   Physical Exam  Constitutional: No distress.  HENT:  Head: Normocephalic and atraumatic.  Right Ear: External ear normal.  Left Ear: External ear normal.  Eyes: Conjunctivae are normal. Right eye exhibits no discharge. Left eye exhibits no discharge. No scleral icterus.  Neck: Neck supple. No tracheal deviation present.  Cardiovascular: Normal rate, regular rhythm and intact distal pulses.    Pulmonary/Chest: Effort normal and breath sounds normal. No stridor. No respiratory distress. He has no wheezes. He has no rales.  Abdominal: Soft. Bowel sounds are normal. He exhibits no distension. There is no tenderness. There is no rebound and no guarding.  Musculoskeletal: He exhibits no edema or tenderness.  Neurological: He is alert. He has normal strength. No cranial nerve deficit (no facial droop, extraocular movements intact, no slurred speech) or sensory deficit. He exhibits normal muscle tone. He displays no seizure activity. Coordination normal.  Skin: Skin is warm and dry. No rash noted.  Psychiatric: He  has a normal mood and affect.  Nursing note and vitals reviewed.    ED Treatments / Results  Labs (all labs ordered are listed, but only abnormal results are displayed) Labs Reviewed  CBC - Abnormal; Notable for the following components:      Result Value   WBC 3.9 (*)    RBC 3.14 (*)    Hemoglobin 10.2 (*)    HCT 31.8 (*)    MCV 101.3 (*)    All other components within normal limits  COMPREHENSIVE METABOLIC PANEL - Abnormal; Notable for the following components:   Sodium 146 (*)    Glucose, Bld 124 (*)    Albumin 3.4 (*)    All other components within normal limits  TROPONIN I - Abnormal; Notable for the following components:   Troponin I 0.05 (*)    All other components within normal limits  BRAIN NATRIURETIC PEPTIDE - Abnormal; Notable for the following components:   B Natriuretic Peptide 2,749.0 (*)    All other components within normal limits  D-DIMER, QUANTITATIVE (NOT AT [facility]) - Abnormal; Notable for the following components:   D-Dimer, Quant 0.60 (*)    All other components within normal limits  PROTIME-INR  POC OCCULT BLOOD, ED  POC OCCULT BLOOD, ED  SAMPLE TO BLOOD BANK    EKG EKG Interpretation  Date/Time:  Saturday March 06 2018 10:44:49 EDT Ventricular Rate:  73 PR Interval:    QRS Duration: 172 QT Interval:  446 QTC Calculation: 492 R  Axis:   -157 Text Interpretation:  Sinus rhythm Paired ventricular premature complexes , new since last tracing Nonspecific intraventricular conduction delay Probable anteroseptal infarct, recent Confirmed by Dorie Rank [phone]) on 03/06/2018 10:50:58 AM   Radiology Dg Chest 2 View  Result Date: 03/06/2018 CLINICAL DATA:  Shortness of breath. EXAM: CHEST - 2 VIEW COMPARISON:  Chest x-ray dated February 11, 2016. FINDINGS: Unchanged single lead left chest wall AICD. Stable cardiomegaly. Lower lobe predominant interstitial and alveolar airspace disease with small bilateral pleural effusions. Bibasilar atelectasis. No pneumothorax. No acute osseous abnormality. IMPRESSION: 1. Mild lower lobe predominant pulmonary edema and bibasilar atelectasis with small bilateral pleural effusions. Electronically Signed   By: Titus Dubin M.D.   On: 03/06/2018 11:40    Procedures Procedures (including critical care time)  Medications Ordered in ED Medications  furosemide (LASIX) injection 40 mg (has no administration in time range)     Initial Impression / Assessment and Plan / ED Course  I have reviewed the triage vital signs and the nursing notes.  Pertinent labs & imaging results that were available during my care of the patient were reviewed by me and considered in my medical decision making (see chart for details).   Patient presented to the emergency room for evaluation of shortness of breath.  Chest x-ray is consistent with pulmonary edema.  Patient's laboratory tests are notable for anemia which is worse than previous labs however he does not have any evidence of blood in his stool.  No signs of active GI bleeding and I doubt that his anemia is the main culprit of his shortness of breath.  Patient does have a significantly elevated BNP and a mild increase in his troponin.  I think this is consistent with a congestive heart failure exacerbation.  D-dimer is slightly elevated but I do not think this is  clinically significant and do not feel further imaging for pulmonary embolism is necessary. I have ordered a dose of Lasix.  I will  consult the medical service for admission and further treatment. Final Clinical Impressions(s) / ED Diagnoses   Final diagnoses:  Acute on chronic congestive heart failure, unspecified heart failure type ([place])  Anemia, unspecified type      Dorie Rank, MD 03/06/18 1234

## 2018-03-06 NOTE — ED Triage Notes (Addendum)
Pt brought in by [facility] with c/o SOB that started this morning. Upon EMS arrival, pt was found to be 86% on RA. Lung sounds clear for EMS. Pt resides in [facility] and they reported to EMS that pt has been having diarrhea with black tarry stools x 2 days. Pt is alert to self, place and situation. Disoriented to time. Pt denies cough and abdominal pain.

## 2018-03-06 NOTE — H&P (Signed)
TRH H&P   Patient Demographics:    [name], is a 62 y.o. male  MRN: [id number]   DOB - May 14, 1956  Admit Date - 03/06/2018  Outpatient Primary MD for the patient is Remi Haggard, FNP  Referring MD Dr. Dorie Rank  Outpatient Specialists:  Dr. Janese Banks (hematology) Dr Nehemiah Massed (cardiology at [facility])  Patient coming from:?  Group home  Chief Complaint  Patient presents with  . Shortness of Breath      HPI:   History obtained from EDP, patient's wife and other provider notes on epic.  Patient is an extremely poor historian.   [name]  is a 62 y.o. male,resident of [facility], with history of nonischemic cardiomyopathy with EF of 20-25% s/p ICD, coronary angioplasty, history of seizures in 2016, diabetes mellitus type 2, history of substance abuse, COPD,  hypothyroidism  pancytopenia with anemia of chronic disease,  schizophrenia, history of stroke, hypothyroidism who was brought to the ED for shortness of breath.  Patient is a very poor historian and reported of feeling short of breath since this morning.  Denied any chest pain.  EMS found patient to have O2 sat of 86% on room air improved with supplemental oxygen.  According to care home he was also having some black tarry stool for the last 2 days. At baseline he is wheelchair bound due to his?  Foot ulcers.  Patient unable to provide much history and per his wife he does appear this way sometimes but usually he is well alert and oriented. No history of palpitations, nausea, vomiting, fevers, chills, abdominal pain, dysuria.  As per the nursing note group home notified EMS that patient was having diarrhea with black tarry stool for 2 days.  In the ED blood pressure was soft with SBP of 97, afebrile, sats at 98% on 2 L via nasal cannula.  Blood work showed WBC of 3.9, hemoglobin of 10.2, platelets of 246  chemistry was unremarkable.  Troponin was mildly elevated at 0.05, blood glucose of 124.  Stool for Hemoccult was negative.  D-dimer was elevated at 0.6.  BNP was markedly elevated at 2749. Chest x-ray done showed mild lower lobe prominence with pulmonary edema and small bilateral pleural effusion. Patient given 40 mg IV Lasix and hospitalist consulted for admission.       Review of systems:    As outlined in HPI.  ROS limited due to patient unable to provide much history.   With Past History of the following :    Past Medical History:  Diagnosis Date  . Anemia   . Anginal pain ([place])   . Collapse of lung 2018  . Decubitus skin ulcer since 2016-17   bilateral heels   . Depression   . Diabetes mellitus without complication ([place])   . Discontinued smoking   . Hypertension   . Metabolic encephalopathy  2017  . Peripheral vascular disease ([place])   . Renal failure (ARF), acute on chronic (HCC)   . Schizophrenia ([place])   . Sepsis ([place]) 2017  . Stroke ([place])   . Thrombocythemia ([place]) 2017  . Thyroid disease       Past Surgical History:  Procedure Laterality Date  . PERIPHERAL VASCULAR CATHETERIZATION Left 05/26/2016   Procedure: Lower Extremity Angiography;  Surgeon: Algernon Huxley, MD;  Location: [place] [facility];  Service: Cardiovascular;  Laterality: Left;  . PERIPHERAL VASCULAR CATHETERIZATION  05/26/2016   Procedure: Lower Extremity Intervention;  Surgeon: Algernon Huxley, MD;  Location: [place] [facility];  Service: Cardiovascular;;  . PERIPHERAL VASCULAR CATHETERIZATION Right 06/05/2016   Procedure: Lower Extremity Angiography;  Surgeon: Algernon Huxley, MD;  Location: [place] [facility];  Service: Cardiovascular;  Laterality: Right;  . periphervascular procedures        Social History:     Social History   Tobacco Use  . Smoking status: Current Every Day Smoker    Packs/day: 1.00    Years: 30.00    Pack years: 30.00    Types: Cigarettes  . Smokeless tobacco: Never  Used  Substance Use Topics  . Alcohol use: No    Alcohol/week: 0.0 oz    Comment:  pt unable to respond. Hx of alcohol abuse (per pt )     Lives -at group home  Mobility -wheelchair-bound     Family History :   Mother had a heart disease Brother had diabetes.    Home Medications:   Prior to Admission medications   Medication Sig Start Date End Date Taking? Authorizing Provider   Home medications based per cardiologist note from 3/19 Aspirin 81 mg daily Lipitor 10 mg daily B complex with vitamin C 1 tablet daily Plavix 75 mg p.o. daily Colace 100 mg capsule twice daily as needed for constipation Ferrous sulfate 325 mg twice daily Keppra 500 mg twice daily Ativan 0.5 mg twice daily as needed for anxiety Midodrine 5 mg twice daily Entresto 24-26 mg twice daily Zoloft 50 mg daily Folic acid 1 tablet daily Ensure twice daily.  Diabetes medications are not listed.     Allergies:    No Known Allergies   Physical Exam:   Vitals  Blood pressure 93/70, pulse 71, temperature (!) 97.5 F (36.4 C), temperature source Oral, resp. rate (!) 21, height 5\' 10"  (1.778 m), weight 59.4 kg (131 lb), SpO2 98 %.   General : Middle-aged male lying in bed in no acute distress HEENT: No pallor, no icterus, moist oral mucosa, JVD + Chest: Fine bibasilar crackles, no rhonchi or wheeze CVS: Normal S1 and S2, no murmurs rubs or gallop GI: Soft, nondistended, nontender, bowel sounds present Musculoskeletal: Warm, no edema CNS: Alert and oriented to person, not oriented to time or place (thinks he is in [place])     Data Review:    CBC Recent Labs  Lab 03/06/18 1050  WBC 3.9*  HGB 10.2*  HCT 31.8*  PLT 246  MCV 101.3*  MCH 32.5  MCHC 32.1  RDW 14.3   ------------------------------------------------------------------------------------------------------------------  Chemistries  Recent Labs  Lab 03/06/18 1050  NA 146*  K 4.7  CL 110  CO2 26  GLUCOSE 124*  BUN 20   CREATININE 1.04  CALCIUM 9.0  AST 19  ALT 21  ALKPHOS 54  BILITOT 0.5   ------------------------------------------------------------------------------------------------------------------ estimated creatinine clearance is 63.5 mL/min (by C-G formula based on SCr of 1.04 mg/dL). ------------------------------------------------------------------------------------------------------------------  No results for input(s): TSH, T4TOTAL, T3FREE, THYROIDAB in the last 72 hours.  Invalid input(s): FREET3  Coagulation profile Recent Labs  Lab 03/06/18 1050  INR 1.19   ------------------------------------------------------------------------------------------------------------------- Recent Labs    03/06/18 1050  DDIMER 0.60*   -------------------------------------------------------------------------------------------------------------------  Cardiac Enzymes Recent Labs  Lab 03/06/18 1050  TROPONINI 0.05*   ------------------------------------------------------------------------------------------------------------------    Component Value Date/Time   BNP 2,749.0 (H) 03/06/2018 1050     ---------------------------------------------------------------------------------------------------------------  Urinalysis    Component Value Date/Time   COLORURINE YELLOW (A) 01/15/2017 1712   APPEARANCEUR CLEAR (A) 01/15/2017 1712   LABSPEC 1.012 01/15/2017 1712   PHURINE 5.0 01/15/2017 1712   GLUCOSEU NEGATIVE 01/15/2017 1712   HGBUR NEGATIVE 01/15/2017 1712   BILIRUBINUR NEGATIVE 01/15/2017 1712   KETONESUR NEGATIVE 01/15/2017 1712   PROTEINUR NEGATIVE 01/15/2017 1712   NITRITE NEGATIVE 01/15/2017 1712   LEUKOCYTESUR NEGATIVE 01/15/2017 1712    ----------------------------------------------------------------------------------------------------------------   Imaging Results:    Dg Chest 2 View  Result Date: 03/06/2018 CLINICAL DATA:  Shortness of breath. EXAM: CHEST - 2 VIEW  COMPARISON:  Chest x-ray dated February 11, 2016. FINDINGS: Unchanged single lead left chest wall AICD. Stable cardiomegaly. Lower lobe predominant interstitial and alveolar airspace disease with small bilateral pleural effusions. Bibasilar atelectasis. No pneumothorax. No acute osseous abnormality. IMPRESSION: 1. Mild lower lobe predominant pulmonary edema and bibasilar atelectasis with small bilateral pleural effusions. Electronically Signed   By: Titus Dubin M.D.   On: 03/06/2018 11:40    My personal review of EKG: Normal sinus rhythm at 73, PVC with IVCD.   Assessment & Plan:    Principal Problem:   Acute on chronic systolic CHF (congestive heart failure) (HCC) History of nonischemic cardiomyopathy.  Has moderate CHF symptoms.  Her place him on IV Lasix 40 mg every 12 hours.  Monitor strict I/O and daily weight. Blood pressure is soft so we will hold his Entresto and amlodipine.  As per cardiologist note patient is off beta-blocker due to symptomatic bradycardia. Check 2D echo. Monitor on telemetry for 24 hours.  Active Problems:   Metabolic encephalopathy Unclear if this is acute versus baseline.  As per wife he is normally oriented but occasionally appears confused.  I was unable to reach the staff at group home.  Check TSH and B12.  Will monitor for now.       Pancytopenia (HCC) Has anemia of chronic disease and mild leukopenia.  Platelets normal.  Seen by hematologist 2 months back and recommended no intervention for now and follow-up in 3-6 months.     Troponin level elevated Possibly demand ischemia with CHF.  No chest pain symptoms.  Monitor on telemetry.  Follow 2D echo    Schizophrenia ([place]) On Zoloft.    History of stroke On aspirin Plavix and statin which will be continued.    PVD (peripheral vascular disease) ([place]) History of peripheral ulcers.  Continue aspirin and Plavix    Type 2 diabetes mellitus without complication (HCC) No medication listed.  Check A1c and  monitor on sliding scale coverage.  History of COPD Stable.  Monitor for now.  History of seizures in 2016 Continue Keppra.    Protein-calorie malnutrition, unspecified Resume Ensure.   DVT Prophylaxis subcu Lovenox  AM Labs Ordered, also please review Full Orders  Family Communication: Admission, patients condition and plan of care including tests being ordered have been discussed with the patient and his wife on the phone.  Code Status DNR  Likely DC to return to  group home once stable, possibly in the next 48 hours  Condition: Fair  Consults called: None  Admission status: Inpatient  Time spent in minutes : 50   Melenie Minniear M.D on 03/06/2018 at 2:30 PM  Between 7am to 7pm - Pager - [phone]. After 7pm go to [email or web address] - password [facility]  [facility] - Office  [phone]

## 2018-03-06 NOTE — ED Notes (Signed)
Date and time results received: 03/06/18 11:30 AM  (use smartphrase ".now" to insert current time)  Test: Troponin Critical Value: 0.05  Name of Provider Notified: Tomi Bamberger  Orders Received? Or Actions Taken?: Orders Received - See Orders for details

## 2018-03-06 NOTE — ED Notes (Signed)
Hospitalist at bedside 

## 2018-03-07 ENCOUNTER — Inpatient Hospital Stay (HOSPITAL_COMMUNITY): Payer: Medicare Other

## 2018-03-07 DIAGNOSIS — I361 Nonrheumatic tricuspid (valve) insufficiency: Secondary | ICD-10-CM

## 2018-03-07 DIAGNOSIS — D649 Anemia, unspecified: Secondary | ICD-10-CM

## 2018-03-07 DIAGNOSIS — I9589 Other hypotension: Secondary | ICD-10-CM

## 2018-03-07 LAB — BASIC METABOLIC PANEL
ANION GAP: 11 (ref 5–15)
BUN: 24 mg/dL — ABNORMAL HIGH (ref 6–20)
CALCIUM: 9.1 mg/dL (ref 8.9–10.3)
CO2: 27 mmol/L (ref 22–32)
CREATININE: 1.21 mg/dL (ref 0.61–1.24)
Chloride: 106 mmol/L (ref 101–111)
GLUCOSE: 90 mg/dL (ref 65–99)
Potassium: 3.8 mmol/L (ref 3.5–5.1)
Sodium: 144 mmol/L (ref 135–145)

## 2018-03-07 LAB — GLUCOSE, CAPILLARY
GLUCOSE-CAPILLARY: 121 mg/dL — AB (ref 65–99)
Glucose-Capillary: 89 mg/dL (ref 65–99)
Glucose-Capillary: 135 mg/dL — ABNORMAL HIGH (ref 65–99)
Glucose-Capillary: 96 mg/dL (ref 65–99)

## 2018-03-07 LAB — ECHOCARDIOGRAM COMPLETE
HEIGHTINCHES: 70 in
WEIGHTICAEL: 2225.76 [oz_av]

## 2018-03-07 LAB — TSH: TSH: 1.878 u[IU]/mL (ref 0.350–4.500)

## 2018-03-07 MED ORDER — MELATONIN 3 MG PO TABS
6.0000 mg | ORAL_TABLET | Freq: Every day | ORAL | Status: DC
  Administered 2018-03-07: 6 mg via ORAL
  Filled 2018-03-07 (×3): qty 2

## 2018-03-07 NOTE — Progress Notes (Signed)
PROGRESS NOTE                                                                                                                                                                                                             Patient Demographics:    [name], is a 62 y.o. male, DOB - Dec 03, 1955, OAC:[id number]  Admit date - 03/06/2018   Admitting Physician Louellen Molder, MD  Outpatient Primary MD for the patient is Remi Haggard, FNP  LOS - 1  Outpatient Specialists: Dr. Janese Banks (hematology) Dr Nehemiah Massed (cardiology at [facility])   Chief Complaint  Patient presents with  . Shortness of Breath       Brief Narrative   62 y.o. male,resident of [facility], with history of nonischemic cardiomyopathy with EF of 20-25% s/p ICD, coronary angioplasty, history of seizures in 2016, diabetes mellitus type 2, history of substance abuse, COPD,  hypothyroidism  pancytopenia with anemia of chronic disease,  schizophrenia, history of stroke, hypothyroidism who was brought to the ED for shortness of breath.  Patient is a very poor historian and reported of feeling short of breath since this morning.  Denied any chest pain.  EMS found patient to have O2 sat of 86% on room air improved with supplemental oxygen.  According to care home he was also having some black tarry stool for the last 2 days    Subjective:   Denies any chest discomfort or shortness of breath.,  Still a very poor historian.  Systolic blood pressure in the 80s so his a.m. Lasix had to be held.   Assessment  & Plan :    Principal Problem:   Acute on chronic systolic CHF (congestive heart failure) (HCC) Worsened EF of 15% with moderate aortic and mitral regurgitation and trivial pericardial effusion.  Remained stable on telemetry.  Has put out 3600 cc since admission.  Holding a.m. Lasix due to soft blood pressure. Entresto and amlodipine have been held due to  low blood pressure as well.  Not on beta-blocker due to symptomatic bradycardia. He has an AICD. We will consult cardiology in a.m.  Active Problems: Pericardial effusion As per echo.  No tamponade physiology noted.  Monitor blood pressure for now.  Further recommendation per cardiology in a.m.  Elevated troponin Possibly demand ischemia.  Denies chest pain symptoms.  Schizophrenia  On Zoloft.     Pancytopenia (HCC) Has anemia of chronic disease and mild leukopenia.  Platelets normal.  Seen by hematologist 2 months back and recommended no intervention for now and follow-up in 3-6 months.   Metabolic encephalopathy Family reports that he acts this way at times.  Unable to reach the staff at group home again.  TSH and B12 pending.  History of stroke On aspirin Plavix and statin which will be continued.    PVD (peripheral vascular disease) (HCC)   Continue aspirin and Plavix    Type 2 diabetes mellitus without complication (HCC) No medication listed.  Check A1c and monitor on sliding scale coverage.  History of COPD Stable.  Monitor for now.  History of seizures in 2016 Continue Keppra.    Protein-calorie malnutrition, unspecified Resume Ensure.      Code Status : DNR  Family Communication  : Discussed with wife and daughter on the phone on admission  Disposition Plan  : Return to group home once stable  Barriers For Discharge : Active symptoms  Consults  : Cardiology in a.m.  Procedures  : 2D echo  DVT Prophylaxis  : Lovenox  Lab Results  Component Value Date   PLT 246 03/06/2018    Antibiotics  :    Anti-infectives (From admission, onward)   None        Objective:   Vitals:   03/07/18 0611 03/07/18 1200 03/07/18 1201 03/07/18 1309  BP: 94/69 (!) 86/55 (!) 90/45 92/63  Pulse: (!) 55 65  (!) 50  Resp:    18  Temp:    (!) 97.4 F (36.3 C)  TempSrc:    Oral  SpO2:  100%  100%  Weight:      Height:        Wt Readings from Last 3  Encounters:  03/07/18 63.1 kg (139 lb 1.8 oz)  12/29/17 59.7 kg (131 lb 11.2 oz)  08/31/17 61.2 kg (135 lb)     Intake/Output Summary (Last 24 hours) at 03/07/2018 1409 Last data filed at 03/07/2018 0800 Gross per 24 hour  Intake 360 ml  Output 3600 ml  Net -3240 ml     Physical Exam  Gen: not in distress, sleepy, poorly communicative HEENT: \ moist mucosa, supple neck Chest: Improved breath sounds over lung bases CVS: N S1&S2, no murmurs, rubs or gallop GI: soft, NT, ND,  Musculoskeletal: warm, no edema     Data Review:    CBC Recent Labs  Lab 03/06/18 1050  WBC 3.9*  HGB 10.2*  HCT 31.8*  PLT 246  MCV 101.3*  MCH 32.5  MCHC 32.1  RDW 14.3    Chemistries  Recent Labs  Lab 03/06/18 1050 03/07/18 0559  NA 146* 144  K 4.7 3.8  CL 110 106  CO2 26 27  GLUCOSE 124* 90  BUN 20 24*  CREATININE 1.04 1.21  CALCIUM 9.0 9.1  AST 19  --   ALT 21  --   ALKPHOS 54  --   BILITOT 0.5  --    ------------------------------------------------------------------------------------------------------------------ No results for input(s): CHOL, HDL, LDLCALC, TRIG, CHOLHDL, LDLDIRECT in the last 72 hours.  Lab Results  Component Value Date   HGBA1C 5.2 01/15/2017   ------------------------------------------------------------------------------------------------------------------ No results for input(s): TSH, T4TOTAL, T3FREE, THYROIDAB in the last 72 hours.  Invalid input(s): FREET3 ------------------------------------------------------------------------------------------------------------------ No results for input(s): VITAMINB12, FOLATE, FERRITIN, TIBC, IRON, RETICCTPCT in the last 72 hours.  Coagulation profile Recent Labs  Lab 03/06/18 1050  INR 1.19    Recent Labs    03/06/18 1050  DDIMER 0.60*    Cardiac Enzymes Recent Labs  Lab 03/06/18 1050  TROPONINI 0.05*    ------------------------------------------------------------------------------------------------------------------    Component Value Date/Time   BNP 2,749.0 (H) 03/06/2018 1050    Inpatient Medications  Scheduled Meds: . aspirin  81 mg Oral Daily  . atorvastatin  10 mg Oral q1800  . cholecalciferol  5,000 Units Oral Daily  . clopidogrel  75 mg Oral Daily  . colchicine  0.6 mg Oral BID  . enoxaparin (LOVENOX) injection  40 mg Subcutaneous Q24H  . furosemide  40 mg Intravenous Q12H  . insulin aspart  0-15 Units Subcutaneous TID WC  . insulin aspart  0-5 Units Subcutaneous QHS  . levETIRAcetam  500 mg Oral BID  . levothyroxine  50 mcg Oral QAC breakfast  . Melatonin  6 mg Oral QHS  . omega-3 acid ethyl esters  2 capsule Oral BID  . potassium chloride  10 mEq Oral Daily  . sodium chloride flush  3 mL Intravenous Q12H  . topiramate  100 mg Oral Daily   Continuous Infusions: . sodium chloride     PRN Meds:.sodium chloride, acetaminophen, ondansetron (ZOFRAN) IV, sodium chloride flush  Micro Results Recent Results (from the past 240 hour(s))  MRSA PCR Screening     Status: None   Collection Time: 03/06/18  4:45 PM  Result Value Ref Range Status   MRSA by PCR NEGATIVE NEGATIVE Final    Comment:        The GeneXpert MRSA Assay (FDA approved for NASAL specimens only), is one component of a comprehensive MRSA colonization surveillance program. It is not intended to diagnose MRSA infection nor to guide or monitor treatment for MRSA infections. Performed at [facility], [address] [place], [place] [address]     Radiology Reports Dg Chest 2 View  Result Date: 03/06/2018 CLINICAL DATA:  Shortness of breath. EXAM: CHEST - 2 VIEW COMPARISON:  Chest x-ray dated February 11, 2016. FINDINGS: Unchanged single lead left chest wall AICD. Stable cardiomegaly. Lower lobe predominant interstitial and alveolar airspace disease with small bilateral pleural effusions. Bibasilar  atelectasis. No pneumothorax. No acute osseous abnormality. IMPRESSION: 1. Mild lower lobe predominant pulmonary edema and bibasilar atelectasis with small bilateral pleural effusions. Electronically Signed   By: Titus Dubin M.D.   On: 03/06/2018 11:40    Time Spent in minutes  25   [name] M.D on 03/07/2018 at 2:09 PM  Between 7am to 7pm - Pager - [phone]  After 7pm go to [email or web address] - password [facility]  [facility] -  Office  [phone]

## 2018-03-07 NOTE — Progress Notes (Signed)
*  PRELIMINARY RESULTS* Echocardiogram 2D Echocardiogram has been performed.  [name] 03/07/2018, 8:57 AM

## 2018-03-08 DIAGNOSIS — D61818 Other pancytopenia: Secondary | ICD-10-CM

## 2018-03-08 DIAGNOSIS — I5023 Acute on chronic systolic (congestive) heart failure: Secondary | ICD-10-CM

## 2018-03-08 DIAGNOSIS — I959 Hypotension, unspecified: Secondary | ICD-10-CM

## 2018-03-08 DIAGNOSIS — I25118 Atherosclerotic heart disease of native coronary artery with other forms of angina pectoris: Secondary | ICD-10-CM

## 2018-03-08 DIAGNOSIS — Z8673 Personal history of transient ischemic attack (TIA), and cerebral infarction without residual deficits: Secondary | ICD-10-CM

## 2018-03-08 DIAGNOSIS — G9341 Metabolic encephalopathy: Secondary | ICD-10-CM

## 2018-03-08 DIAGNOSIS — I739 Peripheral vascular disease, unspecified: Secondary | ICD-10-CM

## 2018-03-08 DIAGNOSIS — I519 Heart disease, unspecified: Secondary | ICD-10-CM

## 2018-03-08 DIAGNOSIS — R748 Abnormal levels of other serum enzymes: Secondary | ICD-10-CM

## 2018-03-08 DIAGNOSIS — F209 Schizophrenia, unspecified: Secondary | ICD-10-CM

## 2018-03-08 LAB — BASIC METABOLIC PANEL
Anion gap: 9 (ref 5–15)
BUN: 23 mg/dL — ABNORMAL HIGH (ref 6–20)
CHLORIDE: 107 mmol/L (ref 101–111)
CO2: 26 mmol/L (ref 22–32)
Calcium: 9.2 mg/dL (ref 8.9–10.3)
Creatinine, Ser: 1.21 mg/dL (ref 0.61–1.24)
Glucose, Bld: 93 mg/dL (ref 65–99)
Potassium: 4 mmol/L (ref 3.5–5.1)
SODIUM: 142 mmol/L (ref 135–145)

## 2018-03-08 LAB — GLUCOSE, CAPILLARY
GLUCOSE-CAPILLARY: 93 mg/dL (ref 65–99)
GLUCOSE-CAPILLARY: 91 mg/dL (ref 65–99)
Glucose-Capillary: 105 mg/dL — ABNORMAL HIGH (ref 65–99)
Glucose-Capillary: 146 mg/dL — ABNORMAL HIGH (ref 65–99)

## 2018-03-08 LAB — VITAMIN B12: VITAMIN B 12: 359 pg/mL (ref 180–914)

## 2018-03-08 MED ORDER — MIDODRINE HCL 5 MG PO TABS
5.0000 mg | ORAL_TABLET | Freq: Two times a day (BID) | ORAL | Status: DC
  Administered 2018-03-08 – 2018-03-09 (×2): 5 mg via ORAL
  Filled 2018-03-08 (×2): qty 1

## 2018-03-08 MED ORDER — SACUBITRIL-VALSARTAN 24-26 MG PO TABS
1.0000 | ORAL_TABLET | Freq: Two times a day (BID) | ORAL | Status: DC
  Administered 2018-03-08: 1 via ORAL
  Filled 2018-03-08 (×5): qty 1

## 2018-03-08 NOTE — Consult Note (Addendum)
Cardiology Consultation:   Patient ID: [name]; [id number]; 1956/03/29   Admit date: 03/06/2018 Date of Consult: 03/08/2018  Primary Care Provider: Remi Haggard, FNP Primary Cardiologist: Dr. Serafina Royals [facility] Primary Electrophysiologist:  [facility]   Patient Profile:   [name] is a 62 y.o. male with a hx of nonischemic cardiomyopathy ejection fraction 20 to 25% status post ICD followed at [facility] who is being seen today for the evaluation of CHF at the request of  Dr. Clementeen Graham.  History of Present Illness:   [name] is a 62 year old male patient who is followed at [facility] for nonischemic cardiomyopathy ejection fraction approximately 20% status post ICD in 2013.  Echo here in 2017 EF 20 to 25%.  Also has CAD status post cath in 2012 with total occlusion of the RCA otherwise normal coronaries CHF felt out of proportion to CAD.Marland Kitchen  Cardiac MRI LVEF 15% with mild to moderate MR.  Hyperenhancement consistent with infarction in the distribution of the RCA without viable  myocardium.  Small subendocardial infarction the LAD territory not significant viable myocardium.  History of recurrent CVA in 2014 documented PFO in 2013 lifelong anticoagulation.  Also has hypertension, HLD, COPD, DM, seizures.  Last saw cardiologist at [facility] 01/21/2018.  Patient was brought to the emergency room with shortness of breath and O2 sats at 86% with supplemental oxygen.  He was also having black tarry stools for the past 2 days.  He lives in a care facility and is wheelchair-bound.  Blood pressures have been soft hemoglobin was 10.2 troponin mildly elevated at 0.05 Hemoccult was negative.  BNP 2749 and chest x-ray consistent with pulmonary edema and small bilateral pleural effusions.  He was given IV Lasix in the emergency room.  2D echo 03/06/2018 LVEF 15% with diffuse hypokinesis mild to moderate AI, moderate MR, severely dilated left atrium trivial pericardial effusion.  Patient is not in the room alone  and is difficult to awaken.  In the fetal position and will not answer any my questions makes examining him very difficult.  Past Medical History:  Diagnosis Date  . Anemia   . Anginal pain ([place])   . Collapse of lung 2018  . Decubitus skin ulcer since 2016-17   bilateral heels   . Depression   . Diabetes mellitus without complication ([place])   . Discontinued smoking   . Hypertension   . Metabolic encephalopathy 3419  . Peripheral vascular disease ([place])   . Renal failure (ARF), acute on chronic (HCC)   . Schizophrenia ([place])   . Sepsis ([place]) 2017  . Stroke ([place])   . Thrombocythemia ([place]) 2017  . Thyroid disease     Past Surgical History:  Procedure Laterality Date  . PERIPHERAL VASCULAR CATHETERIZATION Left 05/26/2016   Procedure: Lower Extremity Angiography;  Surgeon: Algernon Huxley, MD;  Location: [place] [facility];  Service: Cardiovascular;  Laterality: Left;  . PERIPHERAL VASCULAR CATHETERIZATION  05/26/2016   Procedure: Lower Extremity Intervention;  Surgeon: Algernon Huxley, MD;  Location: [place] [facility];  Service: Cardiovascular;;  . PERIPHERAL VASCULAR CATHETERIZATION Right 06/05/2016   Procedure: Lower Extremity Angiography;  Surgeon: Algernon Huxley, MD;  Location: [place] [facility];  Service: Cardiovascular;  Laterality: Right;  . periphervascular procedures       Home Medications:  Prior to Admission medications   Medication Sig Start Date End Date Taking? Authorizing Provider  amLODipine (NORVASC) 10 MG tablet Take 10 mg by mouth daily.   Yes [provider]  atorvastatin (LIPITOR) 10 MG tablet Take 1 tablet (10 mg total) by mouth daily. 05/26/16  Yes Dew, Erskine Squibb, MD  Cholecalciferol (VITAMIN D3) 5000 units CAPS Take 1 capsule by mouth daily.   Yes [provider]  colchicine 0.6 MG tablet Take 0.6 mg by mouth 2 (two) times daily.   Yes [provider]  glimepiride (AMARYL) 2 MG tablet Take 2 mg by mouth daily with breakfast.   Yes [provider]  hydrALAZINE (APRESOLINE) 10 MG tablet Take 10 mg by mouth 3 (three) times daily.   Yes [provider]  levothyroxine (SYNTHROID, LEVOTHROID) 50 MCG tablet Take 50 mcg by mouth daily before breakfast.   Yes [provider]  losartan (COZAAR) 100 MG tablet Take 100 mg by mouth daily.   Yes [provider]  Melatonin 5 MG TABS Take 1 tablet by mouth at bedtime.   Yes [provider]  metoprolol succinate (TOPROL-XL) 100 MG 24 hr tablet Take 100 mg by mouth daily. Take with or immediately following a meal.   Yes [provider]  Omega-3 1000 MG CAPS Take 2 capsules by mouth 2 (two) times daily.   Yes [provider]  potassium chloride (K-DUR) 10 MEQ tablet Take 10 mEq by mouth daily.   Yes [provider]  topiramate (TOPAMAX) 100 MG tablet Take 100 mg by mouth daily.   Yes [provider]    Inpatient Medications: Scheduled Meds: . aspirin  81 mg Oral Daily  . atorvastatin  10 mg Oral q1800  . cholecalciferol  5,000 Units Oral Daily  . clopidogrel  75 mg Oral Daily  . colchicine  0.6 mg Oral BID  . enoxaparin (LOVENOX) injection  40 mg Subcutaneous Q24H  . furosemide  40 mg Intravenous Q12H  . insulin aspart  0-15 Units Subcutaneous TID WC  . insulin aspart  0-5 Units Subcutaneous QHS  . levETIRAcetam  500 mg Oral BID  . levothyroxine  50 mcg Oral QAC breakfast  . Melatonin  6 mg Oral QHS  . omega-3 acid ethyl esters  2 capsule Oral BID  . potassium chloride  10 mEq Oral Daily  . sodium chloride flush  3 mL Intravenous Q12H  . topiramate  100 mg Oral Daily   Continuous Infusions: . sodium chloride     PRN Meds: sodium chloride, acetaminophen, ondansetron (ZOFRAN) IV, sodium chloride flush  Allergies:   No Known Allergies  Social History:   Social History   Socioeconomic History  . Marital status: Single    Spouse name: Not on file  . Number of children: Not on file  . Years of  education: Not on file  . Highest education level: Not on file  Occupational History  . Not on file  Social Needs  . Financial resource strain: Not on file  . Food insecurity:    Worry: Not on file    Inability: Not on file  . Transportation needs:    Medical: Not on file    Non-medical: Not on file  Tobacco Use  . Smoking status: Current Every Day Smoker    Packs/day: 1.00    Years: 30.00    Pack years: 30.00    Types: Cigarettes  . Smokeless tobacco: Never Used  Substance and Sexual Activity  . Alcohol use: No    Alcohol/week: 0.0 oz    Comment:  pt unable to respond. Hx of alcohol abuse (per pt )  . Drug use: No  Comment: unknown pt UTA  . Sexual activity: Not Currently  Lifestyle  . Physical activity:    Days per week: Not on file    Minutes per session: Not on file  . Stress: Not on file  Relationships  . Social connections:    Talks on phone: Not on file    Gets together: Not on file    Attends religious service: Not on file    Active member of club or organization: Not on file    Attends meetings of clubs or organizations: Not on file    Relationship status: Not on file  . Intimate partner violence:    Fear of current or ex partner: Not on file    Emotionally abused: Not on file    Physically abused: Not on file    Forced sexual activity: Not on file  Other Topics Concern  . Not on file  Social History Narrative  . Not on file    Family History:    Family History  Family history unknown: Yes     ROS:  Please see the history of present illness.  Review of Systems  Reason unable to perform ROS: Patient not alert and in the room alone no review of systems can be performed.    All other ROS reviewed and negative.     Physical Exam/Data:   Vitals:   03/07/18 2130 03/07/18 2144 03/08/18 0321 03/08/18 0324  BP: (!) 81/52 (!) 86/60 (!) 88/68 91/62  Pulse: 63 (!) 56 (!) 52 (!) 55  Resp: 16  16   Temp: 98.3 F (36.8 C)  97.9 F (36.6 C)     TempSrc: Oral  Oral   SpO2: 98%  98% 97%  Weight:   136 lb 11 oz (62 kg)   Height:        Intake/Output Summary (Last 24 hours) at 03/08/2018 1134 Last data filed at 03/08/2018 0800 Gross per 24 hour  Intake 360 ml  Output 1350 ml  Net -990 ml   Filed Weights   03/06/18 1651 03/07/18 0608 03/08/18 0321  Weight: 134 lb 11.2 oz (61.1 kg) 139 lb 1.8 oz (63.1 kg) 136 lb 11 oz (62 kg)   Body mass index is 19.61 kg/m.  General: Inferior position, elderly looks older than stated age 64: normal Lymph: no adenopathy Neck: no JVD Endocrine:  No thryomegaly Vascular: No carotid bruits; FA pulses 2+ bilaterally without bruits  Cardiac:  normal S1, S2; RRR; 2/6 to 4/6 systolic murmur in the left sternal border Lungs: Decreased breath sounds but clear Abd: soft, nontender, no hepatomegaly  Ext: no edema Musculoskeletal:  No deformities, BUE and BLE strength normal and equal Skin: warm and dry  Neuro: Cannot awaken Psych: Cannot awaken  EKG:  The EKG was personally reviewed and demonstrates: Normal sinus rhythm with left bundle branch block, no acute change Telemetry:  Telemetry was personally reviewed and demonstrates: Normal sinus rhythm with PVCs  Relevant CV Studies: 2D echo 2014 [facility] severe LV dysfunction EF 20 to 24% normal RV function mild AI, moderate MR trivial PR and trivial TR.  Cardiac catheterization 2012 left main normal, LAD and circumflex and significant RCA total occlusion distal to the acute marginal branch with bridging collaterals.  Heart failure out of proportion to degree of CAD. 2D echo 4/27/2019Study Conclusions   - Left ventricle: The cavity size was severely dilated. Wall   thickness was increased in a pattern of moderate LVH. The   estimated ejection fraction was  15%. Diffuse hypokinesis. The   study is not technically sufficient to allow evaluation of LV   diastolic function. - Aortic valve: Leaflet calcification. No stenosis. There was mild   to  moderate regurgitation. - Mitral valve: Mildly thickened leaflets . There was moderate   regurgitation. - Left atrium: Severely dilated. - Tricuspid valve: There was mild regurgitation. - Pulmonary arteries: PA peak pressure: 32 mm Hg (S). - Inferior vena cava: The vessel was normal in size. The   respirophasic diameter changes were in the normal range (>= 50%),   consistent with normal central venous pressure. - Pericardium, extracardiac: A trivial pericardial effusion was   identified.   Impressions:   - Compared to a prior study in 2017, the LVEF has reduced from   20-25% down to 15%. The LV is severely dilated, as is the left   atrium. There is mild to moderate AI and moderate MR. A trivial   pericardial effusion is noted.  2D echo 2017------------------------------------------------------------------- Study Conclusions   - Left ventricle: The cavity size was moderately dilated. Systolic   function was severely reduced. The estimated ejection fraction   was in the range of 20% to 25%. Diffuse hypokinesis. Regional   wall motion abnormalities cannot be excluded. - Left atrium: The atrium was normal in size. - Right ventricle: Pacer wire or catheter noted in right ventricle.   Systolic function was normal. - Pulmonary arteries: PA peak pressure: 34 mm Hg (S).   Transthoracic echocardiography.  M-mode   Laboratory Data:  Chemistry Recent Labs  Lab 03/06/18 1050 03/07/18 0559 03/08/18 0607  NA 146* 144 142  K 4.7 3.8 4.0  CL 110 106 107  CO2 26 27 26   GLUCOSE 124* 90 93  BUN 20 24* 23*  CREATININE 1.04 1.21 1.21  CALCIUM 9.0 9.1 9.2  GFRNONAA >60 >60 >60  GFRAA >60 >60 >60  ANIONGAP 10 11 9     Recent Labs  Lab 03/06/18 1050  PROT 6.5  ALBUMIN 3.4*  AST 19  ALT 21  ALKPHOS 54  BILITOT 0.5   Hematology Recent Labs  Lab 03/06/18 1050  WBC 3.9*  RBC 3.14*  HGB 10.2*  HCT 31.8*  MCV 101.3*  MCH 32.5  MCHC 32.1  RDW 14.3  PLT 246   Cardiac  Enzymes Recent Labs  Lab 03/06/18 1050  TROPONINI 0.05*   No results for input(s): TROPIPOC in the last 168 hours.  BNP Recent Labs  Lab 03/06/18 1050  BNP 2,749.0*    DDimer  Recent Labs  Lab 03/06/18 1050  DDIMER 0.60*    Radiology/Studies:  Dg Chest 2 View  Result Date: 03/06/2018 CLINICAL DATA:  Shortness of breath. EXAM: CHEST - 2 VIEW COMPARISON:  Chest x-ray dated February 11, 2016. FINDINGS: Unchanged single lead left chest wall AICD. Stable cardiomegaly. Lower lobe predominant interstitial and alveolar airspace disease with small bilateral pleural effusions. Bibasilar atelectasis. No pneumothorax. No acute osseous abnormality. IMPRESSION: 1. Mild lower lobe predominant pulmonary edema and bibasilar atelectasis with small bilateral pleural effusions. Electronically Signed   By: Titus Dubin M.D.   On: 03/06/2018 11:40    Assessment and Plan:   Acute on chronic systolic CHF improved with diuresis -4.2 L since admission  Nonischemic cardiomyopathy ejection fraction now 15% followed closely by cardiologist at [facility].  Recommend follow-up at discharge.  Not sure the meds on admission are accurate as the [facility] notes state he was on Entresto but meds prior to admission show that he is  on losartan. [facility] records from 01/21/18 show he was on Entresto 24/26 mg twice daily, Plavix 75 mg daily Lipitor 10 mg daily aspirin 81 mg daily.  Meds on the records here have high doses of different medications.  At this point would stop Lasix and begin Entresto as treated at [facility]  Status post ICD followed at [facility]  CAD with total RCA otherwise normal see cath results above  History of CVA on long-term anticoagulation with Plavix  Hypertension BP has been low.  Entresto and amlodipine and Lasix held yesterday because of this.  BP now 91/62.  With ejection fraction of 15% he will run a low BP  Metabolic encephalopathy family reports he is this way at times.  COPD  For questions or updates,  please contact [facility] Please consult [email or web address] for contact info under Cardiology/STEMI.   Signed, Ermalinda Barrios, PA-C  03/08/2018 11:34 AM   The patient was seen and examined, and I agree with the history, physical exam, assessment and plan as documented above, with modifications as noted below. I have also personally reviewed all relevant documentation, old records, labs, and both radiographic and cardiovascular studies. I have also independently interpreted old and new ECG's.  He was most recently evaluated in the outpatient setting at [facility] on 01/21/2018.  Cardiac medications at that time included aspirin 81 mg, Lipitor 10 mg, Plavix 75 mg, midodrine 5 mg twice daily, and Entresto 24/26 mg twice daily.  He has diuresed an additional 870 cc in the last 24 hours and over 4 L overall.  His blood pressure is low normal.  I would expect this in the context of his low output.  He appears to be lying flat comfortably.  He currently denies chest pain shortness of breath.  There is no leg edema.  We will resume both Entresto and I will resume his midodrine as well.  Continue aspirin, Plavix, and Lipitor.  Hold off on diuretics.  Echocardiogram performed yesterday reviewed above with LVEF now down to 15% with severe left ventricular dilatation and severe left atrial dilatation.  There was mild to moderate aortic and moderate mitral regurgitation  He has a nonischemic cardiomyopathy which is followed closely at [facility].  He will need close follow-up with them.   Kate Sable, MD, [facility]  03/08/2018 4:33 PM

## 2018-03-08 NOTE — Clinical Social Work Note (Signed)
Clinical Social Work Assessment  Patient Details  Name: [name] MRN: [id number] Date of Birth: 1956-07-31  Date of referral:  03/08/18               Reason for consult:  Discharge Planning                Permission sought to share information with:    Permission granted to share information::     Name::        Agency::     Relationship::     Contact Information:     Housing/Transportation Living arrangements for the past 2 months:  Group Home([facility]) Source of Information:  Facility Patient Interpreter Needed:  None Criminal Activity/Legal Involvement Pertinent to Current Situation/Hospitalization:  No - Comment as needed Significant Relationships:  Adult Children, Spouse Lives with:  Facility Resident Do you feel safe going back to the place where you live?  Yes Need for family participation in patient care:  Yes (Comment)  Care giving concerns: Pt resides in a family care home.   Social Worker assessment / plan: Pt is a 62 year old male admitted from his family care home in [place]. Reviewed pt's record today and pt was discussed in [place]. Pt not yet stable for dc. Spoke with Anne Ng at [facility]. Per Anne Ng, pt has lived there for a little over two years. Pt is non ambulatory and is total care for all ADL's other than feeding. Pt can feed himself. He wears adult diapers. He uses a wheelchair and is total assist for transfers. Pt is able to verbalize his needs. He is soft spoken with slightly slurred speech. Pt on a regular diet at the home. Pt has good family support from his daughters, son, and wife. Plan is for return to the care home at dc. Pt will need EMS transport.  Employment status:  Disabled (Comment on whether or not currently receiving Disability) Insurance information:  Medicare, Medicaid In [place] PT Recommendations:  Not assessed at this time Information / Referral to community resources:     Patient/Family's Response to care:  Pt accepting of care.  Patient/Family's Understanding of and Emotional Response to Diagnosis, Current Treatment, and Prognosis: Pt appears to have an adequate understanding of diagnosis and treatment recommendations. No emotional distress identified.  Emotional Assessment Appearance:  Appears stated age Attitude/Demeanor/Rapport:    Affect (typically observed):  Calm Orientation:  Oriented to Self, Oriented to Situation, Oriented to Place Alcohol / Substance use:  Not Applicable Psych involvement (Current and /or in the community):  No (Comment)  Discharge Needs  Concerns to be addressed:  Discharge Planning Concerns Readmission within the last 30 days:  No Current discharge risk:  None Barriers to Discharge:  No Barriers Identified   Shade Flood, LCSW 03/08/2018, 11:11 AM

## 2018-03-08 NOTE — Progress Notes (Signed)
PROGRESS NOTE                                                                                                                                                                                                             Patient Demographics:    [name], is a 62 y.o. male, DOB - 15-Jul-1956, YFV:[id number]  Admit date - 03/06/2018   Admitting Physician Louellen Molder, MD  Outpatient Primary MD for the patient is Remi Haggard, FNP  LOS - 2  Outpatient Specialists: Dr. Janese Banks (hematology) Dr Nehemiah Massed (cardiology at [facility])   Chief Complaint  Patient presents with  . Shortness of Breath       Brief Narrative   62 y.o. male,resident of [facility], with history of nonischemic cardiomyopathy with EF of 20-25% s/p ICD, coronary angioplasty, history of seizures in 2016, diabetes mellitus type 2, history of substance abuse, COPD,  hypothyroidism  pancytopenia with anemia of chronic disease,  schizophrenia, history of stroke, hypothyroidism who was brought to the ED for shortness of breath.  Patient is a very poor historian and reported of feeling short of breath since this morning.  Denied any chest pain.  EMS found patient to have O2 sat of 86% on room air improved with supplemental oxygen.  According to care home he was also having some black tarry stool for the last 2 days    Subjective:   Patient sleepy but arousable.  Again poorly communicative denies any chest discomfort or shortness of breath.  Blood pressure was low with systolic in the 16B to low 90s so did not receive diuretic yesterday.   Assessment  & Plan :    Principal Problem:   Acute on chronic systolic CHF (congestive heart failure) (HCC) Worsened EF of 15% with moderate aortic and mitral regurgitation and trivial pericardial effusion.  Remained stable on telemetry.  Has has been negative balance of 4.2 L since admission, did not receive Lasix  yesterday due to low blood pressure. Entresto and amlodipine also held due to hypotension.  Not on beta-blocker due to symptomatic bradycardia.  Has AICD.  Further recommendations per cardiology.  Active Problems: Pericardial effusion As per echo.  No tamponade physiology noted.  Low blood pressure but stable (not sure if this is his normal). Cardiology will review echo.  Elevated troponin Possibly demand ischemia.  Denies chest pain symptoms.  Schizophrenia On Zoloft.     Pancytopenia (HCC) Has anemia of chronic disease and mild leukopenia.  Platelets normal.  Seen by hematologist 2 months back and recommended no intervention for now and follow-up in 3-6 months.   Metabolic encephalopathy Family reports that he acts this way at times.  Unable to reach the staff at group home again.  TSH and B12 normal.  History of stroke/PVD On aspirin Plavix and statin. continued.    Type 2 diabetes mellitus without complication (HCC) No medication listed.  Check A1c in a.m.  Sliding scale coverage.  History of COPD Stable.  Monitor for now.  History of seizures in 2016 Continue Keppra.    Protein-calorie malnutrition, unspecified On Ensure      Code Status : DNR  Family Communication  : Discussed with wife and daughter on the phone on admission  Disposition Plan  : Return to group home once diuresed adequately and pending cardiology evaluation.  Possibly tomorrow.  Barriers For Discharge : Active symptoms  Consults  : Cardiology  Procedures  : 2D echo  DVT Prophylaxis  : Lovenox  Lab Results  Component Value Date   PLT 246 03/06/2018    Antibiotics  :    Anti-infectives (From admission, onward)   None        Objective:   Vitals:   03/07/18 2144 03/08/18 0321 03/08/18 0324 03/08/18 1321  BP: (!) 86/60 (!) 88/68 91/62 92/72   Pulse: (!) 56 (!) 52 (!) 55 69  Resp:  16  18  Temp:  97.9 F (36.6 C)  97.8 F (36.6 C)  TempSrc:  Oral  Oral  SpO2:  98%  97% 93%  Weight:  62 kg (136 lb 11 oz)    Height:        Wt Readings from Last 3 Encounters:  03/08/18 62 kg (136 lb 11 oz)  12/29/17 59.7 kg (131 lb 11.2 oz)  08/31/17 61.2 kg (135 lb)     Intake/Output Summary (Last 24 hours) at 03/08/2018 1425 Last data filed at 03/08/2018 0800 Gross per 24 hour  Intake 240 ml  Output 550 ml  Net -310 ml   Physical exam Not in distress, sleepy and poorly communicative HEENT: Moist mucosa, supple neck Chest: Clear bilaterally CVs: Normal S1 and S2, no murmurs GI: Soft, nondistended, nontender Muscular skeletal: Warm, no edema      Data Review:    CBC Recent Labs  Lab 03/06/18 1050  WBC 3.9*  HGB 10.2*  HCT 31.8*  PLT 246  MCV 101.3*  MCH 32.5  MCHC 32.1  RDW 14.3    Chemistries  Recent Labs  Lab 03/06/18 1050 03/07/18 0559 03/08/18 0607  NA 146* 144 142  K 4.7 3.8 4.0  CL 110 106 107  CO2 26 27 26   GLUCOSE 124* 90 93  BUN 20 24* 23*  CREATININE 1.04 1.21 1.21  CALCIUM 9.0 9.1 9.2  AST 19  --   --   ALT 21  --   --   ALKPHOS 54  --   --   BILITOT 0.5  --   --    ------------------------------------------------------------------------------------------------------------------ No results for input(s): CHOL, HDL, LDLCALC, TRIG, CHOLHDL, LDLDIRECT in the last 72 hours.  Lab Results  Component Value Date   HGBA1C 5.2 01/15/2017   ------------------------------------------------------------------------------------------------------------------ Recent Labs    03/07/18 0559  TSH 1.878   ------------------------------------------------------------------------------------------------------------------ Recent Labs    03/07/18 0559  VITAMINB12 359  Coagulation profile Recent Labs  Lab 03/06/18 1050  INR 1.19    Recent Labs    03/06/18 1050  DDIMER 0.60*    Cardiac Enzymes Recent Labs  Lab 03/06/18 1050  TROPONINI 0.05*    ------------------------------------------------------------------------------------------------------------------    Component Value Date/Time   BNP 2,749.0 (H) 03/06/2018 1050    Inpatient Medications  Scheduled Meds: . aspirin  81 mg Oral Daily  . atorvastatin  10 mg Oral q1800  . cholecalciferol  5,000 Units Oral Daily  . clopidogrel  75 mg Oral Daily  . colchicine  0.6 mg Oral BID  . enoxaparin (LOVENOX) injection  40 mg Subcutaneous Q24H  . insulin aspart  0-15 Units Subcutaneous TID WC  . insulin aspart  0-5 Units Subcutaneous QHS  . levETIRAcetam  500 mg Oral BID  . levothyroxine  50 mcg Oral QAC breakfast  . Melatonin  6 mg Oral QHS  . omega-3 acid ethyl esters  2 capsule Oral BID  . potassium chloride  10 mEq Oral Daily  . sacubitril-valsartan  1 tablet Oral BID  . sodium chloride flush  3 mL Intravenous Q12H  . topiramate  100 mg Oral Daily   Continuous Infusions: . sodium chloride     PRN Meds:.sodium chloride, acetaminophen, ondansetron (ZOFRAN) IV, sodium chloride flush  Micro Results Recent Results (from the past 240 hour(s))  MRSA PCR Screening     Status: None   Collection Time: 03/06/18  4:45 PM  Result Value Ref Range Status   MRSA by PCR NEGATIVE NEGATIVE Final    Comment:        The GeneXpert MRSA Assay (FDA approved for NASAL specimens only), is one component of a comprehensive MRSA colonization surveillance program. It is not intended to diagnose MRSA infection nor to guide or monitor treatment for MRSA infections. Performed at [facility], [address] [place], [place] [address]     Radiology Reports Dg Chest 2 View  Result Date: 03/06/2018 CLINICAL DATA:  Shortness of breath. EXAM: CHEST - 2 VIEW COMPARISON:  Chest x-ray dated February 11, 2016. FINDINGS: Unchanged single lead left chest wall AICD. Stable cardiomegaly. Lower lobe predominant interstitial and alveolar airspace disease with small bilateral pleural effusions. Bibasilar  atelectasis. No pneumothorax. No acute osseous abnormality. IMPRESSION: 1. Mild lower lobe predominant pulmonary edema and bibasilar atelectasis with small bilateral pleural effusions. Electronically Signed   By: Titus Dubin M.D.   On: 03/06/2018 11:40    Time Spent in minutes  25   Xee Hollman M.D on 03/08/2018 at 2:25 PM  Between 7am to 7pm - Pager - [phone]  After 7pm go to [email or web address] - password [facility]  [facility] -  Office  [phone]

## 2018-03-09 DIAGNOSIS — I313 Pericardial effusion (noninflammatory): Secondary | ICD-10-CM

## 2018-03-09 DIAGNOSIS — I951 Orthostatic hypotension: Secondary | ICD-10-CM

## 2018-03-09 DIAGNOSIS — I959 Hypotension, unspecified: Secondary | ICD-10-CM | POA: Diagnosis present

## 2018-03-09 LAB — BASIC METABOLIC PANEL
ANION GAP: 10 (ref 5–15)
BUN: 23 mg/dL — AB (ref 6–20)
CALCIUM: 9 mg/dL (ref 8.9–10.3)
CO2: 23 mmol/L (ref 22–32)
CREATININE: 1.1 mg/dL (ref 0.61–1.24)
Chloride: 108 mmol/L (ref 101–111)
GFR calc Af Amer: >60 mL/min (ref >60)
GLUCOSE: 86 mg/dL (ref 65–99)
Potassium: 3.6 mmol/L (ref 3.5–5.1)
Sodium: 141 mmol/L (ref 135–145)

## 2018-03-09 LAB — GLUCOSE, CAPILLARY
Glucose-Capillary: 113 mg/dL — ABNORMAL HIGH (ref 65–99)
Glucose-Capillary: 88 mg/dL (ref 65–99)

## 2018-03-09 LAB — HEMOGLOBIN A1C
Hgb A1c MFr Bld: 4.9 % (ref 4.8–5.6)
Mean Plasma Glucose: 93.93 mg/dL

## 2018-03-09 MED ORDER — CLOPIDOGREL BISULFATE 75 MG PO TABS: 75.0000 mg | ORAL_TABLET | Freq: Every day | ORAL | 0 refills | Status: AC

## 2018-03-09 MED ORDER — LEVETIRACETAM 500 MG PO TABS: 500.0000 mg | ORAL_TABLET | Freq: Two times a day (BID) | ORAL | 0 refills | Status: AC

## 2018-03-09 MED ORDER — MIDODRINE HCL 5 MG PO TABS: 5.0000 mg | ORAL_TABLET | Freq: Two times a day (BID) | ORAL | 0 refills | Status: AC

## 2018-03-09 MED ORDER — ASPIRIN 81 MG PO CHEW: 81.0000 mg | CHEWABLE_TABLET | Freq: Every day | ORAL | 0 refills | Status: AC

## 2018-03-09 NOTE — NC FL2 (Signed)
[facility] LEVEL OF CARE SCREENING TOOL     IDENTIFICATION  Patient Name: [name] Birthdate: 07-19-1956 Sex: male Admission Date (Current Location): 03/06/2018  [place] and [place] Number:  [place] [facility] [id number] [place] and Address:  [facility] [address] [place]      Provider Number: [id number]  Attending Physician Name and Address:  Louellen Molder, MD  Relative Name and Phone Number:  Malachi Bonds (daughter) [phone]    Current Level of Care: Hospital Recommended Level of Care: [facility] Prior Approval Number:    Date Approved/Denied:   PASRR Number:    Discharge Plan: Other (Comment)    Current Diagnoses: Patient Active Problem List   Diagnosis Date Noted  . Hypotension 03/09/2018  . Acute on chronic systolic CHF (congestive heart failure) ([place]) 03/06/2018  . Pancytopenia ([place]) 03/06/2018  . Troponin level elevated 03/06/2018  . Schizophrenia ([place]) 03/06/2018  . History of stroke 03/06/2018  . PVD (peripheral vascular disease) ([place]) 03/06/2018  . Type 2 diabetes mellitus without complication ([place]) 73/41/9379  . Leukopenia 05/05/2017  . Protein-calorie malnutrition, severe 01/16/2017  . Acute hypernatremia 01/14/2017  . Collapse of lung   . Mucus plugging of bronchi   . Acute on chronic respiratory failure ([place])   . Pressure ulcer 02/04/2016  . Right lower lobe pneumonia ([place]) 02/01/2016  . Septic shock ([place]) 02/01/2016  . Hypernatremia 02/01/2016  . Acute renal failure (ARF) ([place]) 02/01/2016  . Lactic acidosis 02/01/2016  . Metabolic encephalopathy 02/40/9735  . Thrombocytopenia ([place]) 02/01/2016  . Anemia 02/01/2016  . Sepsis ([place]) 02/01/2016    Orientation RESPIRATION BLADDER Height & Weight     Self, Situation, Place  Normal Incontinent Weight: 136 lb 0.4 oz (61.7 kg) Height:  5\' 10"  (177.8 cm)  BEHAVIORAL SYMPTOMS/MOOD NEUROLOGICAL BOWEL NUTRITION STATUS      Incontinent  Diet(Regular)  AMBULATORY STATUS COMMUNICATION OF NEEDS Skin   Total Care Verbally                         Personal Care Assistance Level of Assistance  Bathing, Feeding, Dressing Bathing Assistance: Maximum assistance Feeding assistance: Independent Dressing Assistance: Maximum assistance     Functional Limitations Info  Sight, Hearing, Speech Sight Info: Adequate Hearing Info: Adequate Speech Info: Impaired    SPECIAL CARE FACTORS FREQUENCY                       Contractures Contractures Info: Not present    Additional Factors Info  Code Status, Allergies, Psychotropic Code Status Info: DNR Allergies Info: No Known Allergies Psychotropic Info: ativan, zoloft         Current Medications (03/09/2018):  This is the current hospital active medication list Current Facility-Administered Medications  Medication Dose Route Frequency Provider Last Rate Last Dose  . 0.9 %  sodium chloride infusion  250 mL Intravenous PRN Dhungel, Nishant, MD      . acetaminophen (TYLENOL) tablet 650 mg  650 mg Oral Q4H PRN Dhungel, Nishant, MD      . aspirin chewable tablet 81 mg  81 mg Oral Daily Dhungel, Nishant, MD   81 mg at 03/09/18 0819  . atorvastatin (LIPITOR) tablet 10 mg  10 mg Oral q1800 Dhungel, Nishant, MD   10 mg at 03/08/18 1721  . cholecalciferol (VITAMIN D) tablet 5,000 Units  5,000 Units Oral Daily Dhungel, Nishant, MD   5,000 Units at 03/09/18 0819  . clopidogrel (  PLAVIX) tablet 75 mg  75 mg Oral Daily Dhungel, Nishant, MD   75 mg at 03/09/18 0819  . colchicine tablet 0.6 mg  0.6 mg Oral BID Dhungel, Nishant, MD   0.6 mg at 03/09/18 0819  . enoxaparin (LOVENOX) injection 40 mg  40 mg Subcutaneous Q24H Dhungel, Nishant, MD   40 mg at 03/08/18 1721  . insulin aspart (novoLOG) injection 0-15 Units  0-15 Units Subcutaneous TID WC Dhungel, Nishant, MD   2 Units at 03/07/18 1706  . insulin aspart (novoLOG) injection 0-5 Units  0-5 Units Subcutaneous QHS Dhungel, Nishant,  MD      . levETIRAcetam (KEPPRA) tablet 500 mg  500 mg Oral BID Dhungel, Nishant, MD   500 mg at 03/09/18 0820  . levothyroxine (SYNTHROID, LEVOTHROID) tablet 50 mcg  50 mcg Oral QAC breakfast Dhungel, Nishant, MD   50 mcg at 03/09/18 0820  . Melatonin TABS 6 mg  6 mg Oral QHS Dhungel, Nishant, MD   6 mg at 03/07/18 2116  . midodrine (PROAMATINE) tablet 5 mg  5 mg Oral BID WC Herminio Commons, MD   5 mg at 03/09/18 0820  . omega-3 acid ethyl esters (LOVAZA) capsule 2 g  2 capsule Oral BID Dhungel, Nishant, MD   2 g at 03/09/18 0823  . ondansetron (ZOFRAN) injection 4 mg  4 mg Intravenous Q6H PRN Dhungel, Nishant, MD      . potassium chloride (K-DUR) CR tablet 10 mEq  10 mEq Oral Daily Dhungel, Nishant, MD   10 mEq at 03/09/18 0819  . sacubitril-valsartan (ENTRESTO) 24-26 mg per tablet  1 tablet Oral BID Imogene Burn, PA-C   1 tablet at 03/08/18 1354  . sodium chloride flush (NS) 0.9 % injection 3 mL  3 mL Intravenous Q12H Dhungel, Nishant, MD   3 mL at 03/09/18 0820  . sodium chloride flush (NS) 0.9 % injection 3 mL  3 mL Intravenous PRN Dhungel, Nishant, MD      . topiramate (TOPAMAX) tablet 100 mg  100 mg Oral Daily Dhungel, Nishant, MD   100 mg at 03/09/18 0820     Discharge Medications: Medication List    STOP taking these medications   amLODipine 10 MG tablet Commonly known as:  NORVASC   hydrALAZINE 10 MG tablet Commonly known as:  APRESOLINE     metoprolol succinate 100 MG 24 hr tablet Commonly known as:  TOPROL-XL  Entresto    TAKE these medications   aspirin 81 MG chewable tablet Chew 1 tablet (81 mg total) by mouth daily. Start taking on:  03/10/2018   atorvastatin 10 MG tablet Commonly known as:  LIPITOR Take 1 tablet (10 mg total) by mouth daily.   clopidogrel 75 MG tablet Commonly known as:  PLAVIX Take 1 tablet (75 mg total) by mouth daily.   colchicine 0.6 MG tablet Take 0.6 mg by mouth 2 (two) times daily.   glimepiride 2 MG  tablet Commonly known as:  AMARYL Take 2 mg by mouth daily with breakfast.   levETIRAcetam 500 MG tablet Commonly known as:  KEPPRA Take 1 tablet (500 mg total) by mouth 2 (two) times daily.   levothyroxine 50 MCG tablet Commonly known as:  SYNTHROID, LEVOTHROID Take 50 mcg by mouth daily before breakfast.   Melatonin 5 MG Tabs Take 1 tablet by mouth at bedtime.   midodrine 5 MG tablet Commonly known as:  PROAMATINE Take 1 tablet (5 mg total) by mouth 2 (two) times daily with a  meal.   Omega-3 1000 MG Caps Take 2 capsules by mouth 2 (two) times daily.   potassium chloride 10 MEQ tablet Commonly known as:  K-DUR Take 10 mEq by mouth daily.   topiramate 100 MG tablet Commonly known as:  TOPAMAX Take 100 mg by mouth daily.   Vitamin D3 5000 units Caps Take 1 capsule by mouth daily.   Zoloft 50 mg daily     Relevant Imaging Results:  Relevant Lab Results:   Additional Information    Britzy Graul, Clydene Pugh, LCSW

## 2018-03-09 NOTE — Care Management Important Message (Signed)
Important Message  Patient Details  Name: Duong Haydel MRN: [id number] Date of Birth: 1956/06/06   Medicare Important Message Given:  Yes    Rihaan Barrack, Chauncey Reading, RN 03/09/2018, 1:13 PM

## 2018-03-09 NOTE — Clinical Social Work Note (Signed)
Facility aware. Dtr. Aware. EMS transport arranged. LCSW signing off.     Clinton Wahlberg, Clydene Pugh, LCSW

## 2018-03-09 NOTE — Discharge Summary (Signed)
Physician Discharge Summary  [name] [facility]:[id number] DOB: [birth year] DOA: 03/06/2018  PCP: Remi Haggard, FNP  Admit date: 03/06/2018 Discharge date: 03/09/2018  Admitted From: Group home Disposition: Return to group home  Recommendations for Outpatient Follow-up:  1. Follow up with cardiologist Dr. Nehemiah Massed on 03/16/2018 at 9:30 AM [facility] clinic) 2. Follow-up with PCP in 1-2 weeks  Home Health: RN Equipment/Devices: None  Discharge Condition: Fair CODE STATUS: DNR Diet recommendation: Heart Healthy / Carb Modified    Discharge Diagnoses:  Principal Problem:   Acute on chronic systolic CHF (congestive heart failure) (HCC)  Active Problems:   Hypotension   Metabolic encephalopathy   Protein-calorie malnutrition, severe   Pancytopenia (HCC)   Troponin level elevated   Schizophrenia (HCC)   History of stroke   PVD (peripheral vascular disease) (HCC)   Type 2 diabetes mellitus without complication (HCC)  Brief narrative/HPI Please refer to admission H&P for details, in brief,62 y.o. male,resident of [facility], with history of nonischemic cardiomyopathy with EF of 20-25% s/p ICD, coronary angioplasty, history of seizures in 2016, diabetes mellitus type 2, history of substance abuse, COPD,  hypothyroidism  pancytopenia with anemia of chronic disease,  schizophrenia, history of stroke, hypothyroidism who was brought to the ED for shortness of breath.  Patient is a very poor historian and reported of feeling short of breath since this morning.  Denied any chest pain.  EMS found patient to have O2 sat of 86% on room air improved with supplemental oxygen.  According to care home he was also having some black tarry stool for the last 2 days. At baseline he is wheelchair bound due to his?  Foot ulcers.  Patient unable to provide much history and per his wife he does appear this way sometimes but usually he is well alert and oriented. No history of palpitations,  nausea, vomiting, fevers, chills, abdominal pain, dysuria.  As per the nursing note group home notified EMS that patient was having diarrhea with black tarry stool for 2 days.  In the ED blood pressure was soft with SBP of 97, afebrile, sats at 98% on 2 L via nasal cannula.  Blood work showed WBC of 3.9, hemoglobin of 10.2, platelets of 246 chemistry was unremarkable.  Troponin was mildly elevated at 0.05, blood glucose of 124.  Stool for Hemoccult was negative.  D-dimer was elevated at 0.6.  BNP was markedly elevated at 2749. Chest x-ray done showed mild lower lobe prominence with pulmonary edema and small bilateral pleural effusion. Patient given 40 mg IV Lasix and hospitalist consulted for admission.   Hospital course  Principal Problem:   Acute on chronic systolic CHF (congestive heart failure) (HCC) Worsened EF of 15% with moderate aortic and mitral regurgitation and trivial pericardial effusion.  Remained stable on telemetry.  Has had negative balance of 6 L since admission and euvolemic.  Further Lasix has been held due to low blood pressure. Entresto, beta-blocker and amlodipine held due to low blood pressure as well after discussing with cardiology.  He is euvolemic and no further diuretic needed. Arranged appointment with his cardiologist for next week.   Will arrange home health.  Active Problems: Hypotension Systolic blood pressure in the 80s.  Not sure if he runs low blood pressure due to his CHF.  I have discontinued his Entresto, beta-blocker and amlodipine due to persistent low blood pressure. He is on midodrine for?  Chronic hypertension which has been resumed. Will not discharge him on any blood pressure  medications are Lasix until he seen by his cardiologist next week.  Pericardial effusion As per echo.  No tamponade physiology noted.    Elevated troponin Possibly demand ischemia.    No chest pain symptoms or EKG changes.  Schizophrenia On Zoloft.      Pancytopenia (HCC) Has anemia of chronic disease and mild leukopenia. Platelets normal. Seen by hematologist 2 months back and recommended no intervention for now and follow-up in 3-6 months.   ?Metabolic encephalopathy Communicates very poorly Family reports that he acts this way at times.   TSH and B12 normal.  No signs of infection.  History of stroke On aspirin, Plavix and statin which will be continued.  PVD (peripheral vascular disease) (HCC)  Continue aspirin and Plavix  Type 2 diabetes mellitus without complication (HCC) On Amaryl which is continued.  History of COPD Stable.   History of seizures in 2016 Continue Keppra.  Hypothyroidism Continue Synthroid  Protein-calorie malnutrition, unspecified Resume Ensure.    Family Communication  : Discussed with wife and daughter on the phone on admission  Disposition Plan  : Return to group home     Consults  : Cardiology   Procedures  : 2D echo     Discharge Instructions   Allergies as of 03/09/2018   No Known Allergies     Medication List    STOP taking these medications   amLODipine 10 MG tablet Commonly known as:  NORVASC   hydrALAZINE 10 MG tablet Commonly known as:  APRESOLINE     metoprolol succinate 100 MG 24 hr tablet Commonly known as:  TOPROL-XL  Entresto    TAKE these medications   aspirin 81 MG chewable tablet Chew 1 tablet (81 mg total) by mouth daily. Start taking on:  03/10/2018   atorvastatin 10 MG tablet Commonly known as:  LIPITOR Take 1 tablet (10 mg total) by mouth daily.   clopidogrel 75 MG tablet Commonly known as:  PLAVIX Take 1 tablet (75 mg total) by mouth daily.   colchicine 0.6 MG tablet Take 0.6 mg by mouth 2 (two) times daily.   glimepiride 2 MG tablet Commonly known as:  AMARYL Take 2 mg by mouth daily with breakfast.   levETIRAcetam 500 MG tablet Commonly known as:  KEPPRA Take 1 tablet (500 mg total) by mouth 2 (two) times  daily.   levothyroxine 50 MCG tablet Commonly known as:  SYNTHROID, LEVOTHROID Take 50 mcg by mouth daily before breakfast.   Melatonin 5 MG Tabs Take 1 tablet by mouth at bedtime.   midodrine 5 MG tablet Commonly known as:  PROAMATINE Take 1 tablet (5 mg total) by mouth 2 (two) times daily with a meal.   Omega-3 1000 MG Caps Take 2 capsules by mouth 2 (two) times daily.   potassium chloride 10 MEQ tablet Commonly known as:  K-DUR Take 10 mEq by mouth daily.   topiramate 100 MG tablet Commonly known as:  TOPAMAX Take 100 mg by mouth daily.   Vitamin D3 5000 units Caps Take 1 capsule by mouth daily.   Zoloft 50 mg daily   Follow-up Information    Corey Skains, MD Follow up on 03/16/2018.   Specialty:  Cardiology Why:  9:30 AM Contact information: [address] [facility] [place] [address] [phone]        Remi Haggard, FNP. Schedule an appointment as soon as possible for a visit in 1 week(s).   Specialty:  Family Medicine Contact information: [phone]  [place] [address] [phone]          No Known Allergies     Procedures/Studies: Dg Chest 2 View  Result Date: 03/06/2018 CLINICAL DATA:  Shortness of breath. EXAM: CHEST - 2 VIEW COMPARISON:  Chest x-ray dated February 11, 2016. FINDINGS: Unchanged single lead left chest wall AICD. Stable cardiomegaly. Lower lobe predominant interstitial and alveolar airspace disease with small bilateral pleural effusions. Bibasilar atelectasis. No pneumothorax. No acute osseous abnormality. IMPRESSION: 1. Mild lower lobe predominant pulmonary edema and bibasilar atelectasis with small bilateral pleural effusions. Electronically Signed   By: Titus Dubin M.D.   On: 03/06/2018 11:40    2D echo Study Conclusions  - Left ventricle: The cavity size was severely dilated. Wall   thickness was increased in a pattern of moderate LVH. The   estimated ejection fraction was 15%.  Diffuse hypokinesis. The   study is not technically sufficient to allow evaluation of LV   diastolic function. - Aortic valve: Leaflet calcification. No stenosis. There was mild   to moderate regurgitation. - Mitral valve: Mildly thickened leaflets . There was moderate   regurgitation. - Left atrium: Severely dilated. - Tricuspid valve: There was mild regurgitation. - Pulmonary arteries: PA peak pressure: 32 mm Hg (S). - Inferior vena cava: The vessel was normal in size. The   respirophasic diameter changes were in the normal range (>= 50%),   consistent with normal central venous pressure. - Pericardium, extracardiac: A trivial pericardial effusion was   identified.  Impressions:  - Compared to a prior study in 2017, the LVEF has reduced from   20-25% down to 15%. The LV is severely dilated, as is the left   atrium. There is mild to moderate AI and moderate MR. A trivial   pericardial effusion is noted   Subjective:   Discharge Exam: Vitals:   03/09/18 0606 03/09/18 0927  BP: (!) 87/66 (!) 80/50  Pulse: (!) 59   Resp:    Temp: 98.3 F (36.8 C)   SpO2: 100%    Vitals:   03/08/18 2334 03/08/18 2346 03/09/18 0606 03/09/18 0927  BP: (!) 84/64 (!) 87/64 (!) 87/66 (!) 80/50  Pulse: 67 63 (!) 59   Resp:      Temp:   98.3 F (36.8 C)   TempSrc:   Oral   SpO2:   100%   Weight:   61.7 kg (136 lb 0.4 oz)   Height:        General: Not in distress, awake and alert but poorly communicative HEENT: Moist mucosa, supple neck Chest: Clear to auscultation bilaterally CVS: Normal S1 and S2, no murmurs rub or gallop GI: Soft, nondistended, nontender Muscular skeletal: Warm, no edema   The results of significant diagnostics from this hospitalization (including imaging, microbiology, ancillary and laboratory) are listed below for reference.     Microbiology: Recent Results (from the past 240 hour(s))  MRSA PCR Screening     Status: None   Collection Time: 03/06/18  4:45 PM   Result Value Ref Range Status   MRSA by PCR NEGATIVE NEGATIVE Final    Comment:        The GeneXpert MRSA Assay (FDA approved for NASAL specimens only), is one component of a comprehensive MRSA colonization surveillance program. It is not intended to diagnose MRSA infection nor to guide or monitor treatment for MRSA infections. Performed at [facility], [address] [place], [place] [address]      Labs: BNP (  last 3 results) Recent Labs    03/06/18 1050  BNP 9,211.9*   Basic Metabolic Panel: Recent Labs  Lab 03/06/18 1050 03/07/18 0559 03/08/18 0607 03/09/18 0550  NA 146* 144 142 141  K 4.7 3.8 4.0 3.6  CL 110 106 107 108  CO2 26 27 26 23   GLUCOSE 124* 90 93 86  BUN 20 24* 23* 23*  CREATININE 1.04 1.21 1.21 1.10  CALCIUM 9.0 9.1 9.2 9.0   Liver Function Tests: Recent Labs  Lab 03/06/18 1050  AST 19  ALT 21  ALKPHOS 54  BILITOT 0.5  PROT 6.5  ALBUMIN 3.4*   No results for input(s): LIPASE, AMYLASE in the last 168 hours. No results for input(s): AMMONIA in the last 168 hours. CBC: Recent Labs  Lab 03/06/18 1050  WBC 3.9*  HGB 10.2*  HCT 31.8*  MCV 101.3*  PLT 246   Cardiac Enzymes: Recent Labs  Lab 03/06/18 1050  TROPONINI 0.05*   BNP: Invalid input(s): POCBNP CBG: Recent Labs  Lab 03/08/18 0724 03/08/18 1105 03/08/18 1626 03/08/18 2143 03/09/18 0727  GLUCAP 93 91 105* 146* 113*   D-Dimer Recent Labs    03/06/18 1050  DDIMER 0.60*   Hgb A1c No results for input(s): HGBA1C in the last 72 hours. Lipid Profile No results for input(s): CHOL, HDL, LDLCALC, TRIG, CHOLHDL, LDLDIRECT in the last 72 hours. Thyroid function studies Recent Labs    03/07/18 0559  TSH 1.878   Anemia work up Recent Labs    03/07/18 0559  VITAMINB12 359   Urinalysis    Component Value Date/Time   COLORURINE YELLOW (A) 01/15/2017 1712   APPEARANCEUR CLEAR (A) 01/15/2017 1712   LABSPEC 1.012 01/15/2017 1712   PHURINE 5.0 01/15/2017 1712    GLUCOSEU NEGATIVE 01/15/2017 1712   HGBUR NEGATIVE 01/15/2017 1712   BILIRUBINUR NEGATIVE 01/15/2017 1712   KETONESUR NEGATIVE 01/15/2017 1712   PROTEINUR NEGATIVE 01/15/2017 1712   NITRITE NEGATIVE 01/15/2017 1712   LEUKOCYTESUR NEGATIVE 01/15/2017 1712   Sepsis Labs Invalid input(s): PROCALCITONIN,  WBC,  LACTICIDVEN Microbiology Recent Results (from the past 240 hour(s))  MRSA PCR Screening     Status: None   Collection Time: 03/06/18  4:45 PM  Result Value Ref Range Status   MRSA by PCR NEGATIVE NEGATIVE Final    Comment:        The GeneXpert MRSA Assay (FDA approved for NASAL specimens only), is one component of a comprehensive MRSA colonization surveillance program. It is not intended to diagnose MRSA infection nor to guide or monitor treatment for MRSA infections. Performed at [facility], [address], [place], [place] [address]      Time coordinating discharge: [place]  SIGNED:   Louellen Molder, MD  [facility] 03/09/2018, 10:36 AM Pager   If 7PM-7AM, please contact night-coverage [email or web address] Password [email or web address]

## 2018-03-09 NOTE — Care Management Note (Signed)
Case Management Note  Patient Details  Name: [name] MRN: [id number] Date of Birth: 1956-01-20                         Expected Discharge Date:  03/09/18               Expected Discharge Plan:  Assisted Living / Rest Home  In-House Referral:  Clinical Social Work  Discharge planning Services  CM Consult  Post Acute Care Choice:    Choice offered to:  [facility] POA / Guardian, Patient  DME Arranged:    DME Agency:     HH Arranged:  RN [place] Agency:  [place]  Status of Service:  Completed, signed off  If discussed at [place] of Stay Meetings, dates discussed:    Additional Comments:Patient discharging back to [place] today. Will need HH RN for CHF f/u. [name] of [facility] notified and will obtain orders via Epic.  Samarion Ehle, Chauncey Reading, RN 03/09/2018, 12:51 PM

## 2018-03-30 ENCOUNTER — Other Ambulatory Visit: Payer: Self-pay

## 2018-03-30 ENCOUNTER — Inpatient Hospital Stay: Payer: Medicare Other | Attending: Oncology

## 2018-03-30 DIAGNOSIS — D539 Nutritional anemia, unspecified: Secondary | ICD-10-CM | POA: Insufficient documentation

## 2018-03-30 DIAGNOSIS — D72819 Decreased white blood cell count, unspecified: Secondary | ICD-10-CM

## 2018-03-30 LAB — CBC WITH DIFFERENTIAL/PLATELET
BASOS PCT: 1 %
Basophils Absolute: 0 10*3/uL (ref 0–0.1)
EOS ABS: 0.1 10*3/uL (ref 0–0.7)
Eosinophils Relative: 3 %
HCT: 28.9 % — ABNORMAL LOW (ref 40.0–52.0)
HEMOGLOBIN: 10 g/dL — AB (ref 13.0–18.0)
Lymphocytes Relative: 31 %
Lymphs Abs: 1.4 10*3/uL (ref 1.0–3.6)
MCH: 34.6 pg — ABNORMAL HIGH (ref 26.0–34.0)
MCHC: 34.6 g/dL (ref 32.0–36.0)
MCV: 100 fL (ref 80.0–100.0)
MONOS PCT: 9 %
Monocytes Absolute: 0.4 10*3/uL (ref 0.2–1.0)
NEUTROS PCT: 56 %
Neutro Abs: 2.5 10*3/uL (ref 1.4–6.5)
Platelets: 193 10*3/uL (ref 150–440)
RBC: 2.89 MIL/uL — AB (ref 4.40–5.90)
RDW: 14.5 % (ref 11.5–14.5)
WBC: 4.4 10*3/uL (ref 3.8–10.6)

## 2018-05-05 ENCOUNTER — Other Ambulatory Visit: Payer: Self-pay

## 2018-05-05 ENCOUNTER — Inpatient Hospital Stay (HOSPITAL_COMMUNITY)
Admission: EM | Admit: 2018-05-05 | Discharge: 2018-05-10 | DRG: 640 | Disposition: A | Discharge status: Home Health | Payer: Medicare Other | Source: Skilled Nursing Facility | Attending: Internal Medicine | Admitting: Internal Medicine

## 2018-05-05 ENCOUNTER — Emergency Department (HOSPITAL_COMMUNITY): Payer: Medicare Other

## 2018-05-05 ENCOUNTER — Encounter (HOSPITAL_COMMUNITY): Payer: Self-pay

## 2018-05-05 DIAGNOSIS — G9341 Metabolic encephalopathy: Secondary | ICD-10-CM | POA: Diagnosis present

## 2018-05-05 DIAGNOSIS — E861 Hypovolemia: Secondary | ICD-10-CM | POA: Diagnosis present

## 2018-05-05 DIAGNOSIS — F17210 Nicotine dependence, cigarettes, uncomplicated: Secondary | ICD-10-CM | POA: Diagnosis present

## 2018-05-05 DIAGNOSIS — Z681 Body mass index (BMI) 19 or less, adult: Secondary | ICD-10-CM | POA: Diagnosis not present

## 2018-05-05 DIAGNOSIS — F209 Schizophrenia, unspecified: Secondary | ICD-10-CM | POA: Diagnosis present

## 2018-05-05 DIAGNOSIS — Z7902 Long term (current) use of antithrombotics/antiplatelets: Secondary | ICD-10-CM | POA: Diagnosis not present

## 2018-05-05 DIAGNOSIS — E1151 Type 2 diabetes mellitus with diabetic peripheral angiopathy without gangrene: Secondary | ICD-10-CM | POA: Diagnosis present

## 2018-05-05 DIAGNOSIS — I110 Hypertensive heart disease with heart failure: Secondary | ICD-10-CM | POA: Diagnosis present

## 2018-05-05 DIAGNOSIS — N179 Acute kidney failure, unspecified: Secondary | ICD-10-CM

## 2018-05-05 DIAGNOSIS — N170 Acute kidney failure with tubular necrosis: Secondary | ICD-10-CM | POA: Diagnosis present

## 2018-05-05 DIAGNOSIS — F200 Paranoid schizophrenia: Secondary | ICD-10-CM

## 2018-05-05 DIAGNOSIS — E870 Hyperosmolality and hypernatremia: Principal | ICD-10-CM | POA: Diagnosis present

## 2018-05-05 DIAGNOSIS — E860 Dehydration: Secondary | ICD-10-CM | POA: Diagnosis present

## 2018-05-05 DIAGNOSIS — I5022 Chronic systolic (congestive) heart failure: Secondary | ICD-10-CM | POA: Diagnosis present

## 2018-05-05 DIAGNOSIS — E43 Unspecified severe protein-calorie malnutrition: Secondary | ICD-10-CM | POA: Diagnosis present

## 2018-05-05 DIAGNOSIS — R410 Disorientation, unspecified: Secondary | ICD-10-CM

## 2018-05-05 DIAGNOSIS — Z66 Do not resuscitate: Secondary | ICD-10-CM | POA: Diagnosis present

## 2018-05-05 DIAGNOSIS — Z8673 Personal history of transient ischemic attack (TIA), and cerebral infarction without residual deficits: Secondary | ICD-10-CM | POA: Diagnosis not present

## 2018-05-05 DIAGNOSIS — L8990 Pressure ulcer of unspecified site, unspecified stage: Secondary | ICD-10-CM

## 2018-05-05 DIAGNOSIS — Z7982 Long term (current) use of aspirin: Secondary | ICD-10-CM | POA: Diagnosis not present

## 2018-05-05 HISTORY — DX: Heart failure, unspecified: I50.9

## 2018-05-05 LAB — VITAMIN B12: VITAMIN B 12: 629 pg/mL (ref 180–914)

## 2018-05-05 LAB — COMPREHENSIVE METABOLIC PANEL
ALBUMIN: 4.2 g/dL (ref 3.5–5.0)
ALT: 95 U/L — ABNORMAL HIGH (ref 0–44)
AST: 99 U/L — ABNORMAL HIGH (ref 15–41)
Alkaline Phosphatase: 63 U/L (ref 38–126)
BUN: 53 mg/dL — AB (ref 6–20)
CO2: 25 mmol/L (ref 22–32)
Calcium: 9 mg/dL (ref 8.9–10.3)
Chloride: >130 mmol/L (ref 98–111)
Creatinine, Ser: 2.04 mg/dL — ABNORMAL HIGH (ref 0.61–1.24)
GFR calc non Af Amer: 34 mL/min — ABNORMAL LOW (ref >60)
GFR, EST AFRICAN AMERICAN: 39 mL/min — AB (ref >60)
GLUCOSE: 150 mg/dL — AB (ref 70–99)
POTASSIUM: 3.9 mmol/L (ref 3.5–5.1)
SODIUM: 178 mmol/L — AB (ref 135–145)
Total Bilirubin: 0.6 mg/dL (ref 0.3–1.2)
Total Protein: 7.7 g/dL (ref 6.5–8.1)

## 2018-05-05 LAB — SODIUM
SODIUM: 178 mmol/L — AB (ref 135–145)
SODIUM: 176 mmol/L — AB (ref 135–145)

## 2018-05-05 LAB — URINALYSIS, ROUTINE W REFLEX MICROSCOPIC
Bacteria, UA: NONE SEEN
Bilirubin Urine: NEGATIVE
GLUCOSE, UA: NEGATIVE mg/dL
Ketones, ur: NEGATIVE mg/dL
Leukocytes, UA: NEGATIVE
NITRITE: NEGATIVE
PH: 5 (ref 5.0–8.0)
PROTEIN: NEGATIVE mg/dL
SPECIFIC GRAVITY, URINE: 1.011 (ref 1.005–1.030)

## 2018-05-05 LAB — CBC
HCT: 35.7 % — ABNORMAL LOW (ref 39.0–52.0)
Hemoglobin: 10.8 g/dL — ABNORMAL LOW (ref 13.0–17.0)
MCH: 32.3 pg (ref 26.0–34.0)
MCHC: 30.3 g/dL (ref 30.0–36.0)
MCV: 106.9 fL — ABNORMAL HIGH (ref 78.0–100.0)
PLATELETS: 98 10*3/uL — AB (ref 150–400)
RBC: 3.34 MIL/uL — AB (ref 4.22–5.81)
RDW: 15.1 % (ref 11.5–15.5)
WBC: 3.7 10*3/uL — AB (ref 4.0–10.5)

## 2018-05-05 LAB — AMMONIA: Ammonia: 23 umol/L (ref 9–35)

## 2018-05-05 LAB — DIFFERENTIAL
BASOS PCT: 1 %
Basophils Absolute: 0 10*3/uL (ref 0.0–0.1)
EOS PCT: 1 %
Eosinophils Absolute: 0 10*3/uL (ref 0.0–0.7)
Lymphocytes Relative: 39 %
Lymphs Abs: 1.4 10*3/uL (ref 0.7–4.0)
MONO ABS: 0.2 10*3/uL (ref 0.1–1.0)
Monocytes Relative: 5 %
NEUTROS ABS: 2 10*3/uL (ref 1.7–7.7)
NEUTROS PCT: 54 %

## 2018-05-05 LAB — PROTIME-INR
INR: 1.37
PROTHROMBIN TIME: 16.7 s — AB (ref 11.4–15.2)

## 2018-05-05 LAB — ETHANOL

## 2018-05-05 LAB — MRSA PCR SCREENING: MRSA BY PCR: NEGATIVE

## 2018-05-05 LAB — SODIUM, URINE, RANDOM: Sodium, Ur: 109 mmol/L

## 2018-05-05 LAB — RAPID URINE DRUG SCREEN, HOSP PERFORMED
AMPHETAMINES: NOT DETECTED
BENZODIAZEPINES: NOT DETECTED
Cocaine: NOT DETECTED
Opiates: NOT DETECTED
TETRAHYDROCANNABINOL: NOT DETECTED

## 2018-05-05 LAB — APTT: aPTT: 31 seconds (ref 24–36)

## 2018-05-05 LAB — TSH: TSH: 1.595 u[IU]/mL (ref 0.350–4.500)

## 2018-05-05 MED ORDER — ENOXAPARIN SODIUM 40 MG/0.4ML ~~LOC~~ SOLN
40.0000 mg | SUBCUTANEOUS | Status: DC
  Administered 2018-05-05 – 2018-05-09 (×5): 40 mg via SUBCUTANEOUS
  Filled 2018-05-05 (×6): qty 0.4

## 2018-05-05 MED ORDER — SERTRALINE HCL 50 MG PO TABS
50.0000 mg | ORAL_TABLET | Freq: Every day | ORAL | Status: DC
  Administered 2018-05-05 – 2018-05-09 (×5): 50 mg via ORAL
  Filled 2018-05-05 (×5): qty 1

## 2018-05-05 MED ORDER — ASPIRIN 81 MG PO CHEW
81.0000 mg | CHEWABLE_TABLET | Freq: Every day | ORAL | Status: DC
  Administered 2018-05-05 – 2018-05-10 (×6): 81 mg via ORAL
  Filled 2018-05-05 (×6): qty 1

## 2018-05-05 MED ORDER — SENNOSIDES-DOCUSATE SODIUM 8.6-50 MG PO TABS: 1.0000 | ORAL_TABLET | Freq: Every evening | ORAL | Status: DC | PRN

## 2018-05-05 MED ORDER — SACUBITRIL-VALSARTAN 24-26 MG PO TABS
1.0000 | ORAL_TABLET | Freq: Two times a day (BID) | ORAL | Status: DC
  Administered 2018-05-05 – 2018-05-10 (×9): 1 via ORAL
  Filled 2018-05-05 (×14): qty 1

## 2018-05-05 MED ORDER — LEVETIRACETAM 500 MG PO TABS
500.0000 mg | ORAL_TABLET | Freq: Two times a day (BID) | ORAL | Status: DC
  Administered 2018-05-05 – 2018-05-10 (×10): 500 mg via ORAL
  Filled 2018-05-05 (×10): qty 1

## 2018-05-05 MED ORDER — DOCUSATE SODIUM 100 MG PO CAPS
100.0000 mg | ORAL_CAPSULE | Freq: Two times a day (BID) | ORAL | Status: DC
  Administered 2018-05-05 – 2018-05-10 (×9): 100 mg via ORAL
  Filled 2018-05-05 (×10): qty 1

## 2018-05-05 MED ORDER — ACETAMINOPHEN 650 MG RE SUPP: 650.0000 mg | Freq: Four times a day (QID) | RECTAL | Status: DC | PRN

## 2018-05-05 MED ORDER — ACETAMINOPHEN 325 MG PO TABS: 650.0000 mg | ORAL_TABLET | Freq: Four times a day (QID) | ORAL | Status: DC | PRN

## 2018-05-05 MED ORDER — FOLIC ACID 1 MG PO TABS
1.0000 mg | ORAL_TABLET | Freq: Every day | ORAL | Status: DC
  Administered 2018-05-05 – 2018-05-10 (×6): 1 mg via ORAL
  Filled 2018-05-05 (×6): qty 1

## 2018-05-05 MED ORDER — ONDANSETRON HCL 4 MG PO TABS: 4.0000 mg | ORAL_TABLET | Freq: Four times a day (QID) | ORAL | Status: DC | PRN

## 2018-05-05 MED ORDER — MIDODRINE HCL 5 MG PO TABS
5.0000 mg | ORAL_TABLET | Freq: Two times a day (BID) | ORAL | Status: DC
  Administered 2018-05-05 – 2018-05-10 (×10): 5 mg via ORAL
  Filled 2018-05-05 (×11): qty 1

## 2018-05-05 MED ORDER — CLOPIDOGREL BISULFATE 75 MG PO TABS
75.0000 mg | ORAL_TABLET | Freq: Every day | ORAL | Status: DC
  Administered 2018-05-05 – 2018-05-10 (×6): 75 mg via ORAL
  Filled 2018-05-05 (×6): qty 1

## 2018-05-05 MED ORDER — ATORVASTATIN CALCIUM 10 MG PO TABS
10.0000 mg | ORAL_TABLET | Freq: Every day | ORAL | Status: DC
  Administered 2018-05-05 – 2018-05-10 (×6): 10 mg via ORAL
  Filled 2018-05-05 (×6): qty 1

## 2018-05-05 MED ORDER — DEXTROSE 5 % IV SOLN
INTRAVENOUS | Status: DC
  Administered 2018-05-05 – 2018-05-10 (×9): via INTRAVENOUS

## 2018-05-05 MED ORDER — ONDANSETRON HCL 4 MG/2ML IJ SOLN: 4.0000 mg | Freq: Four times a day (QID) | INTRAMUSCULAR | Status: DC | PRN

## 2018-05-05 MED ORDER — SODIUM CHLORIDE 0.9 % IV BOLUS
500.0000 mL | Freq: Once | INTRAVENOUS | Status: AC
  Administered 2018-05-05: 500 mL via INTRAVENOUS

## 2018-05-05 MED ORDER — FERROUS SULFATE 325 (65 FE) MG PO TABS
325.0000 mg | ORAL_TABLET | Freq: Every day | ORAL | Status: DC
  Administered 2018-05-06 – 2018-05-10 (×5): 325 mg via ORAL
  Filled 2018-05-05 (×5): qty 1

## 2018-05-05 MED ORDER — LACTATED RINGERS IV SOLN
INTRAVENOUS | Status: DC
  Administered 2018-05-05: 13:00:00 via INTRAVENOUS

## 2018-05-05 MED ORDER — DEXTROSE 5 % IV SOLN: INTRAVENOUS | Status: DC

## 2018-05-05 NOTE — ED Triage Notes (Signed)
Per EMS, pt is a resident at [facility].  Reports pt had been receiving home health after having chf and nurse came out today to try to discharge him from home health services and noticed pt had altered mental status and generalized weakness.  According to ems pt is usually alert and oriented, ambulatory, and speaks clearly.  Today pt able to state his name but speech is garbled and pt weak.  EMS says they were told the weakness started last night.  EMS did not have a LKW time.

## 2018-05-05 NOTE — H&P (Addendum)
History and Physical    Drequan Ironside BZJ:[id number] DOB: [birth year] DOA: 05/05/2018  Referring MD/NP/PA: Nanda Quinton, EDP  PCP: Remi Haggard, FNP  Patient coming from: Skilled nursing facility  Chief Complaint: Confusion  HPI: [name] is a 62 y.o. male with a history of schizophrenia, diabetes, hypertension who presents to the hospital today with confusion from his skilled nursing facility.  Upon arrival to the emergency department he is found to have a sodium level of 178.  He also has a history of systolic heart failure with a known ejection fraction of 15% and has been on Lasix chronically.  Patient is really not able to give me much history and does appear confused, is not able to tell me where he is, the name of the town, he is oriented to person.  In the ED is found to be bradycardic, although this appears to be his baseline, he is not on a beta-blocker.  Creatinine is 2.04, Last known is 1.10 in April 2019.  Admission has been requested for further evaluation and management.  Past Medical/Surgical History: Past Medical History:  Diagnosis Date  . Anemia   . Anginal pain ([place])   . CHF (congestive heart failure) ([place])   . Collapse of lung 2018  . Decubitus skin ulcer since 2016-17   bilateral heels   . Depression   . Diabetes mellitus without complication ([place])   . Discontinued smoking   . Hypertension   . Metabolic encephalopathy 0175  . Peripheral vascular disease ([place])   . Renal failure (ARF), acute on chronic (HCC)   . Schizophrenia ([place])   . Sepsis ([place]) 2017  . Stroke ([place])   . Thrombocythemia ([place]) 2017  . Thyroid disease     Past Surgical History:  Procedure Laterality Date  . PERIPHERAL VASCULAR CATHETERIZATION Left 05/26/2016   Procedure: Lower Extremity Angiography;  Surgeon: Algernon Huxley, MD;  Location: [facility];  Service: Cardiovascular;  Laterality: Left;  . PERIPHERAL VASCULAR CATHETERIZATION  05/26/2016   Procedure: Lower Extremity  Intervention;  Surgeon: Algernon Huxley, MD;  Location: [facility];  Service: Cardiovascular;;  . PERIPHERAL VASCULAR CATHETERIZATION Right 06/05/2016   Procedure: Lower Extremity Angiography;  Surgeon: Algernon Huxley, MD;  Location: [facility];  Service: Cardiovascular;  Laterality: Right;  . periphervascular procedures      Social History:  reports that he has been smoking cigarettes.  He has a 30.00 pack-year smoking history. He has never used smokeless tobacco. He reports that he does not drink alcohol or use drugs.  Allergies: No Known Allergies  Family History: Unable to obtain family history as patient is currently quite confused. Family History  Family history unknown: Yes    Prior to Admission medications   Medication Sig Start Date End Date Taking? Authorizing Provider  aspirin 81 MG chewable tablet Chew 1 tablet (81 mg total) by mouth daily. 03/10/18  Yes Dhungel, Nishant, MD  atorvastatin (LIPITOR) 10 MG tablet Take 1 tablet (10 mg total) by mouth daily. 05/26/16  Yes Dew, Erskine Squibb, MD  B Complex-C (B-COMPLEX WITH VITAMIN C) tablet Take 1 tablet by mouth daily.   Yes [provider]  clopidogrel (PLAVIX) 75 MG tablet Take 1 tablet (75 mg total) by mouth daily. 03/09/18 03/09/19 Yes Dhungel, Nishant, MD  docusate sodium (COLACE) 100 MG capsule Take 100 mg by mouth 2 (two) times daily.   Yes [provider]  ferrous sulfate 325 (65 FE) MG tablet Take  325 mg by mouth daily with breakfast.   Yes [provider]  folic acid (FOLVITE) 1 MG tablet Take 1 mg by mouth daily.   Yes [provider]  furosemide (LASIX) 20 MG tablet Take 20 mg by mouth daily.   Yes [provider]  levETIRAcetam (KEPPRA) 500 MG tablet Take 1 tablet (500 mg total) by mouth 2 (two) times daily. 03/09/18  Yes Dhungel, Nishant, MD  LORazepam (ATIVAN) 0.5 MG tablet Take 0.5 mg by mouth 2 (two) times daily as needed for anxiety.   Yes [provider]    midodrine (PROAMATINE) 5 MG tablet Take 1 tablet (5 mg total) by mouth 2 (two) times daily with a meal. 03/09/18  Yes Dhungel, Nishant, MD  OVER THE COUNTER MEDICATION [place] fanny cream: Apply as directed as needed for rash   Yes [provider]  sacubitril-valsartan (ENTRESTO) 24-26 MG Take 1 tablet by mouth 2 (two) times daily.   Yes [provider]  sertraline (ZOLOFT) 50 MG tablet Take 50 mg by mouth at bedtime.   Yes [provider]    Review of Systems:  Unable to obtain review of systems as patient remains quite confused   Physical Exam: Vitals:   05/05/18 1330 05/05/18 1430 05/05/18 1530 05/05/18 1655  BP: 101/63 96/66 108/76   Pulse: (!) 49 (!) 42 (!) 47   Resp: 10 16 16    Temp:   97.6 F (36.4 C)   TempSrc:   Oral   SpO2: 99% 100% 100%   Weight:    56 kg (123 lb 7.3 oz)     Constitutional: NAD, calm, comfortable, appears dry, cachectic Eyes: PERRL, lids and conjunctivae normal ENMT: Mucous membranes are  dry. Posterior pharynx clear of any exudate or lesions.poor dentition.  Neck: normal, supple, no masses, no thyromegaly Respiratory: clear to auscultation bilaterally, no wheezing, no crackles. Normal respiratory effort. No accessory muscle use.  Cardiovascular: Regular rate and rhythm, no murmurs / rubs / gallops. No extremity edema. 2+ pedal pulses. No carotid bruits.  Abdomen: no tenderness, no masses palpated. No hepatosplenomegaly. Bowel sounds positive.  Musculoskeletal: no clubbing / cyanosis. No joint deformity upper and lower extremities. Good ROM, no contractures. Normal muscle tone.  Skin: no rashes, lesions, ulcers. No induration Neurologic: Moves all 4 extremities spontaneous, unable to fully assess his neurologic status given his current confusion. Psychiatric: Unable to assess given current mental state   Labs on Admission: I have personally reviewed the following labs and imaging studies  CBC: Recent Labs  Lab  05/05/18 1141  WBC 3.7*  NEUTROABS 2.0  HGB 10.8*  HCT 35.7*  MCV 106.9*  PLT 98*   Basic Metabolic Panel: Recent Labs  Lab 05/05/18 1141 05/05/18 1323 05/05/18 1510  NA 178* 178* 176*  K 3.9  --   --   CL >130*  --   --   CO2 25  --   --   GLUCOSE 150*  --   --   BUN 53*  --   --   CREATININE 2.04*  --   --   CALCIUM 9.0  --   --    GFR: Estimated Creatinine Clearance: 30.5 mL/min (A) (by C-G formula based on SCr of 2.04 mg/dL (H)). Liver Function Tests: Recent Labs  Lab 05/05/18 1141  AST 99*  ALT 95*  ALKPHOS 63  BILITOT 0.6  PROT 7.7  ALBUMIN 4.2   No results for input(s): LIPASE, AMYLASE in the last 168 hours.  Recent Labs  Lab 05/05/18 1141  AMMONIA 23   Coagulation Profile: Recent Labs  Lab 05/05/18 1141  INR 1.37   Cardiac Enzymes: No results for input(s): CKTOTAL, CKMB, CKMBINDEX, TROPONINI in the last 168 hours. BNP (last 3 results) No results for input(s): PROBNP in the last 8760 hours. HbA1C: No results for input(s): HGBA1C in the last 72 hours. CBG: No results for input(s): GLUCAP in the last 168 hours. Lipid Profile: No results for input(s): CHOL, HDL, LDLCALC, TRIG, CHOLHDL, LDLDIRECT in the last 72 hours. Thyroid Function Tests: No results for input(s): TSH, T4TOTAL, FREET4, T3FREE, THYROIDAB in the last 72 hours. Anemia Panel: No results for input(s): VITAMINB12, FOLATE, FERRITIN, TIBC, IRON, RETICCTPCT in the last 72 hours. Urine analysis:    Component Value Date/Time   COLORURINE YELLOW 05/05/2018 1233   APPEARANCEUR HAZY (A) 05/05/2018 1233   LABSPEC 1.011 05/05/2018 1233   PHURINE 5.0 05/05/2018 1233   GLUCOSEU NEGATIVE 05/05/2018 1233   HGBUR SMALL (A) 05/05/2018 1233   BILIRUBINUR NEGATIVE 05/05/2018 1233   KETONESUR NEGATIVE 05/05/2018 1233   PROTEINUR NEGATIVE 05/05/2018 1233   NITRITE NEGATIVE 05/05/2018 1233   LEUKOCYTESUR NEGATIVE 05/05/2018 1233   Sepsis Labs: @LABRCNTIP (procalcitonin:4,lacticidven:4) )No  results found for this or any previous visit (from the past 240 hour(s)).   Radiological Exams on Admission: Dg Chest 2 View  Result Date: 05/05/2018 CLINICAL DATA:  Altered mental status with cough. EXAM: CHEST - 2 VIEW COMPARISON:  03/06/2018 FINDINGS: Left-sided pacemaker unchanged. Lungs are adequately inflated and otherwise clear. Moderate stable cardiomegaly. Remainder of the exam is unchanged. IMPRESSION: No acute cardiopulmonary disease. Moderate stable cardiomegaly. Electronically Signed   By: Marin Olp M.D.   On: 05/05/2018 11:58   Ct Head Wo Contrast  Result Date: 05/05/2018 CLINICAL DATA:  Altered mental status and generalized weakness. EXAM: CT HEAD WITHOUT CONTRAST TECHNIQUE: Contiguous axial images were obtained from the base of the skull through the vertex without intravenous contrast. COMPARISON:  02/10/2016 FINDINGS: Brain: The ventricles, cisterns and other CSF spaces compatible with minimal atrophic change. There is moderate chronic ischemic microvascular disease. 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 No evidence of mass, mass effect, shift of midline structures or acute hemorrhage. No evidence of acute infarction. Small old lacune infarct over the left lentiform nucleus. Vascular: No hyperdense vessel or unexpected calcification. Skull: Normal. Negative for fracture or focal lesion. Sinuses/Orbits: Orbits are normal. Paranasal sinuses are well developed and demonstrate mild patchy opacification and mucosal membrane thickening compatible chronic inflammatory change. Mastoid air cells are clear. Other: Soft tissue swelling over the left frontal scalp. IMPRESSION: No acute intracranial findings. Moderate chronic ischemic microvascular disease and stable atrophic change. 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 Minimal soft tissue swelling over the left frontal scalp. Mild chronic sinus inflammatory change. Electronically  Signed   By: Marin Olp M.D.   On: 05/05/2018 11:45    EKG: Independently reviewed.  Sinus bradycardia at a rate of 53, right bundle branch block and left posterior fascicular block, no acute ischemic changes.  Assessment/Plan Principal Problem:   Acute hypernatremia Active Problems:   Metabolic encephalopathy   Protein-calorie malnutrition, severe   Schizophrenia ([place])   ARF (acute renal failure) (HCC)    Acute hypernatremia -Sodium is markedly elevated to 178. -Despite his low ejection fraction, he appears remarkably dry on exam with poor skin turgor, cracked lips and tongue. -We will elect to start gentle hydration with D5W at 75 cc an  hour, on latest recheck sodium is down to 176, will continue to follow closely. -Diuretics, Lasix, are currently on hold.  Acute metabolic encephalopathy -Secondary to severe hyper natremia, continue to follow.  Severe protein caloric malnutrition -Request dietitian evaluation, patient is quite cachectic and malnourished.  Schizophrenia -Mood is currently stable, does not appear to be in any chronic medications for mood stabilization.  Acute renal failure -Suspect due to excessive diuretic use. -Follow renal function with IV fluids.   DVT prophylaxis: Lovenox Code Status: DNR, patient has come in with a gold DNR form from SNF. Family Communication: None, patient only Disposition Plan: Back to SNF once medically stable Consults called: None Admission status: Admit - It is my clinical opinion that admission to INPATIENT is reasonable and necessary because of the expectation that this patient will require hospital care that crosses at least 2 midnights to treat this condition based on the medical complexity of the problems presented.  Given the aforementioned information, the predictability of an adverse outcome is felt to be significant.      Time Spent: 90 minutes  Lizandro Spellman Isaac Bliss MD [facility] Pager  [phone]  If 7PM-7AM, please contact night-coverage [email or web address] Password [facility]  05/05/2018, 4:57 PM

## 2018-05-05 NOTE — ED Notes (Signed)
Attempted to call number listed in epic for pt but no answer.  EMS did not have a contact number for pt's facility.

## 2018-05-05 NOTE — ED Notes (Signed)
EMS reports pt was hypotensive 80/50 when they arrived and was in bigeminy on monitor.  CBG 96 per ems.

## 2018-05-05 NOTE — Progress Notes (Signed)
CRITICAL VALUE ALERT  Critical Value:  Sodium of 176  Date & Time Notied:  15:55  Provider Notified: Dr. Jerilee Hoh  Orders Received/Actions taken: Changed IV fluids to D5% at 75/hr.

## 2018-05-05 NOTE — ED Notes (Signed)
CRITICAL VALUE ALERT  Critical Value:  Sodium 178  Date & Time Notied:  05/05/18 1411  Provider Notified: dr long  Orders Received/Actions taken:

## 2018-05-05 NOTE — ED Provider Notes (Signed)
Emergency Department Provider Note   I have reviewed the triage vital signs and the nursing notes.   HISTORY  Chief Complaint Altered Mental Status   HPI [name] is a 62 y.o. male with PMH of CHF, DM, HTN, metabolic encephalopathy, and peripheral vascular disease presents to the emergency department for evaluation of confusion and speech changes.  Patient arrives by EMS from [facility].  He was being evaluated this morning when staff noticed the mental status change and generalized weakness.  Unsure of time last seen normal.  Patient denies any pain or shortness of breath but does seem confused and has trouble participating with parts of the exam.   Level 5 caveat: Confusion and speech change.    Past Medical History:  Diagnosis Date  . Anemia   . Anginal pain ([place])   . CHF (congestive heart failure) ([place])   . Collapse of lung 2018  . Decubitus skin ulcer since 2016-17   bilateral heels   . Depression   . Diabetes mellitus without complication ([place])   . Discontinued smoking   . Hypertension   . Metabolic encephalopathy 7829  . Peripheral vascular disease ([place])   . Renal failure (ARF), acute on chronic (HCC)   . Schizophrenia ([place])   . Sepsis ([place]) 2017  . Stroke ([place])   . Thrombocythemia ([place]) 2017  . Thyroid disease     Patient Active Problem List   Diagnosis Date Noted  . Hypotension 03/09/2018  . Acute on chronic systolic CHF (congestive heart failure) ([place]) 03/06/2018  . Pancytopenia ([place]) 03/06/2018  . Troponin level elevated 03/06/2018  . Schizophrenia ([place]) 03/06/2018  . History of stroke 03/06/2018  . PVD (peripheral vascular disease) ([place]) 03/06/2018  . Type 2 diabetes mellitus without complication ([place]) 56/21/3086  . Leukopenia 05/05/2017  . Protein-calorie malnutrition, severe 01/16/2017  . Acute hypernatremia 01/14/2017  . Collapse of lung   . Mucus plugging of bronchi   . Acute on chronic respiratory failure ([place])   . Pressure  ulcer 02/04/2016  . Right lower lobe pneumonia ([place]) 02/01/2016  . Septic shock ([place]) 02/01/2016  . Hypernatremia 02/01/2016  . Acute renal failure (ARF) ([place]) 02/01/2016  . Lactic acidosis 02/01/2016  . Metabolic encephalopathy 57/84/6962  . Thrombocytopenia ([place]) 02/01/2016  . Anemia 02/01/2016  . Sepsis ([place]) 02/01/2016    Past Surgical History:  Procedure Laterality Date  . PERIPHERAL VASCULAR CATHETERIZATION Left 05/26/2016   Procedure: Lower Extremity Angiography;  Surgeon: Algernon Huxley, MD;  Location: [facility];  Service: Cardiovascular;  Laterality: Left;  . PERIPHERAL VASCULAR CATHETERIZATION  05/26/2016   Procedure: Lower Extremity Intervention;  Surgeon: Algernon Huxley, MD;  Location: [place] [facility];  Service: Cardiovascular;;  . PERIPHERAL VASCULAR CATHETERIZATION Right 06/05/2016   Procedure: Lower Extremity Angiography;  Surgeon: Algernon Huxley, MD;  Location: [place];  Service: Cardiovascular;  Laterality: Right;  . periphervascular procedures      Allergies Patient has no known allergies.  Family History  Family history unknown: Yes    Social History Social History   Tobacco Use  . Smoking status: Current Every Day Smoker    Packs/day: 1.00    Years: 30.00    Pack years: 30.00    Types: Cigarettes  . Smokeless tobacco: Never Used  Substance Use Topics  . Alcohol use: No    Alcohol/week: 0.0 oz    Comment:  pt unable to respond. Hx of alcohol abuse (per pt )  .  Drug use: No    Comment: unknown pt UTA    Review of Systems  Level 5 caveat: Confusion and speech change.   ____________________________________________   PHYSICAL EXAM:  VITAL SIGNS: ED Triage Vitals  Enc Vitals Group     BP 05/05/18 1048 96/62     Pulse Rate 05/05/18 1048 (!) 44     Resp 05/05/18 1048 12     Temp 05/05/18 1048 97.9 F (36.6 C)     Temp Source 05/05/18 1048 Oral     SpO2 05/05/18 1048 100 %     Pain Score 05/05/18 1044 0   Constitutional:  Alert but confused with slurred speech. Very thin and chronically-ill appearing but in no acute distress.  Eyes: Conjunctivae are normal. PERRL.  Head: Atraumatic. Nose: No congestion/rhinnorhea. Mouth/Throat: Mucous membranes are dry.  Neck: No stridor.   Cardiovascular: Normal rate, regular rhythm. Good peripheral circulation. Grossly normal heart sounds.   Respiratory: Normal respiratory effort.  No retractions. Lungs CTAB. Gastrointestinal: Soft and nontender. No distention.  Musculoskeletal: No lower extremity tenderness nor edema. No gross deformities of extremities. Neurologic: Speech is slightly slurred. Answering some questions appropriately but not conversational. Moving upper and lower extremities equally. No motor or sensory deficits appreciated. Patient unable to participate with EOM exam.  Skin:  Skin is warm, dry and intact. No rash noted.  ____________________________________________   LABS (all labs ordered are listed, but only abnormal results are displayed)  Labs Reviewed  PROTIME-INR - Abnormal; Notable for the following components:      Result Value   Prothrombin Time 16.7 (*)    All other components within normal limits  CBC - Abnormal; Notable for the following components:   WBC 3.7 (*)    RBC 3.34 (*)    Hemoglobin 10.8 (*)    HCT 35.7 (*)    MCV 106.9 (*)    Platelets 98 (*)    All other components within normal limits  COMPREHENSIVE METABOLIC PANEL - Abnormal; Notable for the following components:   Sodium 178 (*)    Chloride >130 (*)    Glucose, Bld 150 (*)    BUN 53 (*)    Creatinine, Ser 2.04 (*)    AST 99 (*)    ALT 95 (*)    GFR calc non Af Amer 34 (*)    GFR calc Af Amer 39 (*)    All other components within normal limits  RAPID URINE DRUG SCREEN, HOSP PERFORMED - Abnormal; Notable for the following components:   Barbiturates   (*)    Value: Result not available. Reagent lot number recalled by manufacturer.   All other components within  normal limits  URINALYSIS, ROUTINE W REFLEX MICROSCOPIC - Abnormal; Notable for the following components:   APPearance HAZY (*)    Hgb urine dipstick SMALL (*)    All other components within normal limits  ETHANOL  APTT  DIFFERENTIAL  AMMONIA  SODIUM, URINE, RANDOM  SODIUM  SODIUM  SODIUM  SODIUM  SODIUM  SODIUM  SODIUM  I-STAT TROPONIN, ED   ____________________________________________  EKG   EKG Interpretation  Date/Time:  Wednesday May 05 2018 10:45:48 EDT Ventricular Rate:  53 PR Interval:    QRS Duration: 179 QT Interval:  579 QTC Calculation: 544 R Axis:   -166 Text Interpretation:  Sinus rhythm Ventricular premature complex RBBB and LPFB No STEMI.  Confirmed by Nanda Quinton [phone]) on 05/05/2018 10:51:48 AM       ____________________________________________  RADIOLOGY  Dg Chest 2 View  Result Date: 05/05/2018 CLINICAL DATA:  Altered mental status with cough. EXAM: CHEST - 2 VIEW COMPARISON:  03/06/2018 FINDINGS: Left-sided pacemaker unchanged. Lungs are adequately inflated and otherwise clear. Moderate stable cardiomegaly. Remainder of the exam is unchanged. IMPRESSION: No acute cardiopulmonary disease. Moderate stable cardiomegaly. Electronically Signed   By: Marin Olp M.D.   On: 05/05/2018 11:58   Ct Head Wo Contrast  Result Date: 05/05/2018 CLINICAL DATA:  Altered mental status and generalized weakness. EXAM: CT HEAD WITHOUT CONTRAST TECHNIQUE: Contiguous axial images were obtained from the base of the skull through the vertex without intravenous contrast. COMPARISON:  02/10/2016 FINDINGS: Brain: The ventricles, cisterns and other CSF spaces compatible with minimal atrophic change. There is moderate chronic ischemic microvascular disease. 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 No evidence of mass, mass effect, shift of midline structures or acute hemorrhage. No evidence of acute infarction. Small old lacune infarct over the left  lentiform nucleus. Vascular: No hyperdense vessel or unexpected calcification. Skull: Normal. Negative for fracture or focal lesion. Sinuses/Orbits: Orbits are normal. Paranasal sinuses are well developed and demonstrate mild patchy opacification and mucosal membrane thickening compatible chronic inflammatory change. Mastoid air cells are clear. Other: Soft tissue swelling over the left frontal scalp. IMPRESSION: No acute intracranial findings. Moderate chronic ischemic microvascular disease and stable atrophic change. 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 Minimal soft tissue swelling over the left frontal scalp. Mild chronic sinus inflammatory change. Electronically Signed   By: Marin Olp M.D.   On: 05/05/2018 11:45    ____________________________________________   PROCEDURES  Procedure(s) performed:   .Critical Care Performed by: Margette Fast, MD Authorized by: Margette Fast, MD   Critical care provider statement:    Critical care time (minutes):  35   Critical care time was exclusive of:  Separately billable procedures and treating other patients and teaching time   Critical care was necessary to treat or prevent imminent or life-threatening deterioration of the following conditions:  Dehydration, metabolic crisis and CNS failure or compromise   Critical care was time spent personally by me on the following activities:  Blood draw for specimens, development of treatment plan with patient or surrogate, evaluation of patient's response to treatment, examination of patient, obtaining history from patient or surrogate, ordering and performing treatments and interventions, ordering and review of laboratory studies, ordering and review of radiographic studies, pulse oximetry, re-evaluation of patient's condition and review of old charts   I assumed direction of critical care for this patient from another provider in my specialty: no        ____________________________________________   INITIAL IMPRESSION / [place] / ED COURSE  Pertinent labs & imaging results that were available during my care of the patient were reviewed by me and considered in my medical decision making (see chart for details).  Patient presents to the emergency department for evaluation of altered mental status.  He is coming from a rehab facility and was due to be discharged today but staff found him altered.  Unknown last seen normal.  Patient has no gross neuro deficits but does appear to have some slurred speech.  His blood pressure is slightly low.  He seems more consistent with an acute more global encephalopathy rather than stroke.  Plan to obtain CT head along with chest x-ray, labs, and reassess.  Patient is afebrile but have also considered the possibility of an early infectious process. EKG  reviewed which appears similar to prior.   01:30 PM Labs resulted with sodium of 178 and chloride of 130.  Patient is on Lasix.  He remains confused.  He received a 500 mL bolus with presumed dehydration.  He does have an ejection fraction of 15%.  He is exhibiting no signs of volume overload after bolus.  Oxygen saturation have been normal.  Will continue rehydration slowly with LR at 75 mL's per hour.  Patient does have some mild bradycardia at times but appears to be near his baseline.  CT imaging of the head and chest x-ray reviewed with no acute findings.  Ammonia normal.   Discussed patient's case with Hospitalist, Dr. Jerilee Hoh to request admission. Patient and family (if present) updated with plan. Care transferred to Hospitalist service.  I reviewed all nursing notes, vitals, pertinent old records, EKGs, labs, imaging (as available).  ____________________________________________  FINAL CLINICAL IMPRESSION(S) / ED DIAGNOSES  Final diagnoses:  Acute hypernatremia  Disorientation    MEDICATIONS GIVEN DURING THIS  VISIT:  Medications  lactated ringers infusion ( Intravenous New Bag/Given 05/05/18 1324)  sodium chloride 0.9 % bolus 500 mL (0 mLs Intravenous Stopped 05/05/18 1137)    Note:  This document was prepared using Dragon voice recognition software and may include unintentional dictation errors.  Nanda Quinton, MD Emergency Medicine    Fleur Audino, Wonda [facility], MD 05/05/18 [phone]

## 2018-05-05 NOTE — ED Notes (Signed)
No answer at the number for the facility listed online.

## 2018-05-05 NOTE — ED Notes (Signed)
MD at bedside. 

## 2018-05-05 NOTE — ED Notes (Signed)
Lab in room drawing blood work  

## 2018-05-05 NOTE — ED Notes (Signed)
CRITICAL VALUE ALERT  Critical Value:  Na 178 Chloride 144   Date & Time Notied:  1256 05/05/18   Provider Notified: Notified Dr. Laverta Baltimore   Orders Received/Actions taken: No actions yet

## 2018-05-06 LAB — BASIC METABOLIC PANEL
BUN: 46 mg/dL — AB (ref 6–20)
CALCIUM: 8.8 mg/dL — AB (ref 8.9–10.3)
CO2: 25 mmol/L (ref 22–32)
Chloride: >130 mmol/L (ref 98–111)
Creatinine, Ser: 1.61 mg/dL — ABNORMAL HIGH (ref 0.61–1.24)
GFR calc Af Amer: 52 mL/min — ABNORMAL LOW (ref >60)
GFR, EST NON AFRICAN AMERICAN: 45 mL/min — AB (ref >60)
GLUCOSE: 101 mg/dL — AB (ref 70–99)
Potassium: 3.4 mmol/L — ABNORMAL LOW (ref 3.5–5.1)
Sodium: 170 mmol/L (ref 135–145)

## 2018-05-06 LAB — CBC
HCT: 35.2 % — ABNORMAL LOW (ref 39.0–52.0)
Hemoglobin: 10.7 g/dL — ABNORMAL LOW (ref 13.0–17.0)
MCH: 32.2 pg (ref 26.0–34.0)
MCHC: 30.4 g/dL (ref 30.0–36.0)
MCV: 106 fL — ABNORMAL HIGH (ref 78.0–100.0)
Platelets: 95 10*3/uL — ABNORMAL LOW (ref 150–400)
RBC: 3.32 MIL/uL — ABNORMAL LOW (ref 4.22–5.81)
RDW: 15 % (ref 11.5–15.5)
WBC: 4.6 10*3/uL (ref 4.0–10.5)

## 2018-05-06 MED ORDER — SODIUM CHLORIDE 0.9 % IV BOLUS
500.0000 mL | Freq: Once | INTRAVENOUS | Status: AC
  Administered 2018-05-06: 500 mL via INTRAVENOUS

## 2018-05-06 MED ORDER — MIDODRINE HCL 5 MG PO TABS
5.0000 mg | ORAL_TABLET | Freq: Once | ORAL | Status: DC
  Filled 2018-05-06: qty 1

## 2018-05-06 NOTE — Progress Notes (Signed)
Initial Nutrition Assessment  DOCUMENTATION CODES:   Severe malnutrition in context of chronic illness, Underweight  INTERVENTION:  Food preferences obtained each meal   NUTRITION DIAGNOSIS:   Severe Malnutrition related to chronic illness as evidenced by severe muscle depletion, severe fat depletion, percent weight loss.   GOAL:   Patient will meet greater than or equal to 90% of their needs MONITOR:   PO intake, Weight trends, Labs, Supplement acceptance, Skin  REASON FOR ASSESSMENT:   Consult Assessment of nutrition requirement/status  ASSESSMENT:  The patient is an underweight 62 yo from a local rest home. Hypernatremia (178) and increased BUN (46), creatinine (1.61), chloride >130 on admission. He presents with complaint of increased confusion. PMH: CHF, thyroid disease, anemia, PVD, decubitus, schizophrenia and stroke. He is also a tobacco smoker.    Patient has sitter present. He ate 100% of his breakfast but did not want lunch. He responds to simple questions. Patient says he usually eats three meals a day and he was drinking Chocolate Ensure but not now. Expect his po intake has been very poor based on his dehydrated state on admission and underweight status. Nutrition services is following with pt to obtain food preferences during RD visit. He does not want Ensure. Will trial vital cuisine instead and assess acceptance.  He is being fed by staff at this time. Unsure if this is his baseline.  His weight is down 9%  (12 lb) in 2 months which is significant. It appears based on hospital records his weight has been between 132-137 lb most of the past 2 years.NFPE: His nutrition focused exam reveals severe muscle and fat loss upper and lower body. Details below.  Labs reviewed: BMP Latest Ref Rng & Units 05/06/2018 05/05/2018 05/05/2018  Glucose 70 - 99 mg/dL 101(H) - -  BUN 6 - 20 mg/dL 46(H) - -  Creatinine 0.61 - 1.24 mg/dL 1.61(H) - -  Sodium 135 - 145 mmol/L 170(HH)  176(HH) 178(HH)  Potassium 3.5 - 5.1 mmol/L 3.4(L) - -  Chloride 98 - 111 mmol/L >130(HH) - -  CO2 22 - 32 mmol/L 25 - -  Calcium 8.9 - 10.3 mg/dL 8.8(L) - -    Meds reviewed: colace, ferrous sulfate, folic acid.  He is on a chronic diuretic medication but is not at this time.    IVF- D5 @ 75 ml/hr   NUTRITION - FOCUSED PHYSICAL EXAM:    Most Recent Value  Orbital Region  Mild depletion  Upper Arm Region  Severe depletion  Thoracic and Lumbar Region  Severe depletion  Buccal Region  Mild depletion  Temple Region  Mild depletion  Clavicle Bone Region  Severe depletion  Clavicle and Acromion Bone Region  Severe depletion  Scapular Bone Region  Severe depletion  Dorsal Hand  -- [unable to assess- Kopplin mitts bilateral hands]  Patellar Region  Severe depletion  Anterior Thigh Region  Severe depletion  Posterior Calf Region  Severe depletion  Edema (RD Assessment)  None  Hair  Reviewed  Eyes  Reviewed  Mouth  Reviewed [dry, cracked lips]  Skin  Reviewed [dry, poor skin turgor]  Nails  Reviewed      Diet Order:   Diet Order           Diet Heart Room service appropriate? Yes; Fluid consistency: Thin  Diet effective now          EDUCATION NEEDS:   Not appropriate for education at this timeSkin:  Skin Assessment: Skin Integrity Issues: Skin  Integrity Issues:: Stage II Stage II: buttocks  Last BM:  unknown  Height:   Ht Readings from Last 1 Encounters:  03/06/18 5\' 10"  (1.778 m)    Weight:   Wt Readings from Last 1 Encounters:  05/05/18 123 lb 7.3 oz (56 kg)    Ideal Body Weight:   no height available   BMI:  Body mass index is 17.71 kg/m.  Estimated Nutritional Needs:   Kcal:  1700-1960 (30-35 kcal/kg)  Protein:  73-78 gr protein  Fluid:  1.7 liters daily   Colman Cater MS,RD,CSG,LDN Office: [phone] Pager: [phone]

## 2018-05-06 NOTE — Progress Notes (Signed)
CRITICAL VALUE ALERT  Critical Value:  Sodium=170 and Chloride =>130  Date & Time Notied:  05/06/18 0555  Provider Notified: Dr. Darrick Meigs  Orders Received/Actions taken: No new orders given at this time.

## 2018-05-06 NOTE — Progress Notes (Signed)
PROGRESS NOTE    [name]  OHY:[id number] DOB: Mar 24, 1956 DOA: 05/05/2018 PCP: Remi Haggard, FNP     Brief Narrative:  62 year old man admitted from family care home on 6/26 due to confusion.  He has a history of schizophrenia, diabetes, and hypertension.  Upon arrival he was noted to have a sodium level of 178.  He also has a history of chronic systolic heart failure with a known ejection fraction of 15%.  Admission was requested for further evaluation and management.  He was also noted on admission to have acute renal failure postop   Assessment & Plan:   Principal Problem:   Acute hypernatremia Active Problems:   Metabolic encephalopathy   Protein-calorie malnutrition, severe   Schizophrenia ([place])   ARF (acute renal failure) (HCC)   Acute hypernatremia -Sodium was markedly elevated to 178 on admission, down to 170 on 6/27 with administration of D5W. -Continue gentle hydration with D5W at 75 cc an hour given history of severe systolic heart failure and continue to hold diuretic therapy. -Despite IV fluids, patient continues to appear clinically dry.  Acute metabolic encephalopathy -Secondary to severe hypernatremia. -This appears to be improving as per my examination today.  Chronic systolic heart failure -Known ejection fraction of 15%. -Monitor volume status closely given his need for continued IV fluids given severe dehydration and hypernatremia. -He continues to appear hypovolemic on exam, okay to continue fluids.  Severe protein caloric malnutrition -Appreciate dietitian input and recommendations.  Schizophrenia -Mood is currently stable, does not appear to be on any chronic mood stabilizer medications.  Acute renal failure -Suspect due to ATN, prerenal azotemia and excessive diuretic use. -Baseline is 1.10, 2.03 on admission and down to 1.61 on 6/27.   DVT prophylaxis: Lovenox Code Status: DNR Family Communication: Patient only Disposition Plan:  Back to family care home once sodium improving and medically stable  Consultants:   None  Procedures:   None  Antimicrobials:  Anti-infectives (From admission, onward)   None       Subjective: Lying in bed, able to tell me his name, where he is, and the month, still somewhat drowsy.  Denies chest discomfort or shortness of breath.    Objective: Vitals:   05/05/18 1655 05/05/18 2041 05/05/18 2055 05/06/18 0542  BP:   98/62 98/72  Pulse:   (!) 48 86  Resp:   18 18  Temp:   98.1 F (36.7 C) (!) 97.5 F (36.4 C)  TempSrc:   Oral Oral  SpO2:  95% 92% 100%  Weight: 56 kg (123 lb 7.3 oz)       Intake/Output Summary (Last 24 hours) at 05/06/2018 0919 Last data filed at 05/05/2018 2000 Gross per 24 hour  Intake 318.75 ml  Output -  Net 318.75 ml   Filed Weights   05/05/18 1655  Weight: 56 kg (123 lb 7.3 oz)    Examination:  General exam: Alert, awake, oriented x 3, appears cachectic. Respiratory system: Clear to auscultation. Respiratory effort normal.  No crackles or wheezes. Cardiovascular system:RRR. No murmurs, rubs, gallops. Gastrointestinal system: Abdomen is nondistended, soft and nontender. No organomegaly or masses felt. Normal bowel sounds heard. Central nervous system: Alert and oriented. No focal neurological deficits. Extremities: No C/C/E, +pedal pulses Skin: No rashes, lesions or ulcers Psychiatry: Judgement and insight appear normal. Mood & affect appropriate.     Data Reviewed: I have personally reviewed following labs and imaging studies  CBC: Recent Labs  Lab 05/05/18 1141 05/06/18 0412  WBC 3.7* 4.6  NEUTROABS 2.0  --   HGB 10.8* 10.7*  HCT 35.7* 35.2*  MCV 106.9* 106.0*  PLT 98* 95*   Basic Metabolic Panel: Recent Labs  Lab 05/05/18 1141 05/05/18 1323 05/05/18 1510 05/06/18 0412  NA 178* 178* 176* 170*  K 3.9  --   --  3.4*  CL >130*  --   --  >130*  CO2 25  --   --  25  GLUCOSE 150*  --   --  101*  BUN 53*  --   --  46*    CREATININE 2.04*  --   --  1.61*  CALCIUM 9.0  --   --  8.8*   GFR: Estimated Creatinine Clearance: 38.6 mL/min (A) (by C-G formula based on SCr of 1.61 mg/dL (H)). Liver Function Tests: Recent Labs  Lab 05/05/18 1141  AST 99*  ALT 95*  ALKPHOS 63  BILITOT 0.6  PROT 7.7  ALBUMIN 4.2   No results for input(s): LIPASE, AMYLASE in the last 168 hours. Recent Labs  Lab 05/05/18 1141  AMMONIA 23   Coagulation Profile: Recent Labs  Lab 05/05/18 1141  INR 1.37   Cardiac Enzymes: No results for input(s): CKTOTAL, CKMB, CKMBINDEX, TROPONINI in the last 168 hours. BNP (last 3 results) No results for input(s): PROBNP in the last 8760 hours. HbA1C: No results for input(s): HGBA1C in the last 72 hours. CBG: No results for input(s): GLUCAP in the last 168 hours. Lipid Profile: No results for input(s): CHOL, HDL, LDLCALC, TRIG, CHOLHDL, LDLDIRECT in the last 72 hours. Thyroid Function Tests: Recent Labs    05/05/18 1141  TSH 1.595   Anemia Panel: Recent Labs    05/05/18 1141  VITAMINB12 629   Urine analysis:    Component Value Date/Time   COLORURINE YELLOW 05/05/2018 1233   APPEARANCEUR HAZY (A) 05/05/2018 1233   LABSPEC 1.011 05/05/2018 1233   PHURINE 5.0 05/05/2018 1233   GLUCOSEU NEGATIVE 05/05/2018 1233   HGBUR SMALL (A) 05/05/2018 1233   BILIRUBINUR NEGATIVE 05/05/2018 1233   KETONESUR NEGATIVE 05/05/2018 1233   PROTEINUR NEGATIVE 05/05/2018 1233   NITRITE NEGATIVE 05/05/2018 1233   LEUKOCYTESUR NEGATIVE 05/05/2018 1233   Sepsis Labs: @LABRCNTIP (procalcitonin:4,lacticidven:4)  ) Recent Results (from the past 240 hour(s))  MRSA PCR Screening     Status: None   Collection Time: 05/05/18  6:16 PM  Result Value Ref Range Status   MRSA by PCR NEGATIVE NEGATIVE Final    Comment:        The GeneXpert MRSA Assay (FDA approved for NASAL specimens only), is one component of a comprehensive MRSA colonization surveillance program. It is not intended to  diagnose MRSA infection nor to guide or monitor treatment for MRSA infections. Performed at [facility], [address] [place], [place] [address]          Radiology Studies: Dg Chest 2 View  Result Date: 05/05/2018 CLINICAL DATA:  Altered mental status with cough. EXAM: CHEST - 2 VIEW COMPARISON:  03/06/2018 FINDINGS: Left-sided pacemaker unchanged. Lungs are adequately inflated and otherwise clear. Moderate stable cardiomegaly. Remainder of the exam is unchanged. IMPRESSION: No acute cardiopulmonary disease. Moderate stable cardiomegaly. Electronically Signed   By: Marin Olp M.D.   On: 05/05/2018 11:58   Ct Head Wo Contrast  Result Date: 05/05/2018 CLINICAL DATA:  Altered mental status and generalized weakness. EXAM: CT HEAD WITHOUT CONTRAST TECHNIQUE: Contiguous axial images were obtained from the base of the skull through the vertex without intravenous  contrast. COMPARISON:  02/10/2016 FINDINGS: Brain: The ventricles, cisterns and other CSF spaces compatible with minimal atrophic change. There is moderate chronic ischemic microvascular disease. 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 No evidence of mass, mass effect, shift of midline structures or acute hemorrhage. No evidence of acute infarction. Small old lacune infarct over the left lentiform nucleus. Vascular: No hyperdense vessel or unexpected calcification. Skull: Normal. Negative for fracture or focal lesion. Sinuses/Orbits: Orbits are normal. Paranasal sinuses are well developed and demonstrate mild patchy opacification and mucosal membrane thickening compatible chronic inflammatory change. Mastoid air cells are clear. Other: Soft tissue swelling over the left frontal scalp. IMPRESSION: No acute intracranial findings. Moderate chronic ischemic microvascular disease and stable atrophic change. 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 Minimal soft tissue swelling over the  left frontal scalp. Mild chronic sinus inflammatory change. Electronically Signed   By: Marin Olp M.D.   On: 05/05/2018 11:45        Scheduled Meds: . aspirin  81 mg Oral Daily  . atorvastatin  10 mg Oral Daily  . clopidogrel  75 mg Oral Daily  . docusate sodium  100 mg Oral BID  . enoxaparin (LOVENOX) injection  40 mg Subcutaneous Q24H  . ferrous sulfate  325 mg Oral Q breakfast  . folic acid  1 mg Oral Daily  . levETIRAcetam  500 mg Oral BID  . midodrine  5 mg Oral BID WC  . sacubitril-valsartan  1 tablet Oral BID  . sertraline  50 mg Oral QHS   Continuous Infusions: . dextrose 75 mL/hr at 05/06/18 0505     LOS: 1 day    Time spent: 35 minutes. Greater than 50% of this time was spent in direct contact with the patient, coordinating care and discussing relevant ongoing clinical issues, including hypernatremia and treatment plan with IV fluids that requires close monitoring of his volume status given his severe systolic heart failure.Lelon Frohlich, MD [facility] Pager [phone]  If 7PM-7AM, please contact night-coverage [email or web address] Password [facility] 05/06/2018, 9:19 AM

## 2018-05-06 NOTE — Care Management (Addendum)
Patient is from [facility]. He is active with [facility] (precertification ends Saturday). If he returns to [facility], he will need new orders for home health.

## 2018-05-07 LAB — BASIC METABOLIC PANEL
Anion gap: 6 (ref 5–15)
BUN: 32 mg/dL — AB (ref 6–20)
CHLORIDE: 126 mmol/L — AB (ref 98–111)
CO2: 22 mmol/L (ref 22–32)
CREATININE: 1.43 mg/dL — AB (ref 0.61–1.24)
Calcium: 8.4 mg/dL — ABNORMAL LOW (ref 8.9–10.3)
GFR calc Af Amer: >60 mL/min (ref >60)
GFR calc non Af Amer: 52 mL/min — ABNORMAL LOW (ref >60)
Glucose, Bld: 161 mg/dL — ABNORMAL HIGH (ref 70–99)
Potassium: 2.8 mmol/L — ABNORMAL LOW (ref 3.5–5.1)
Sodium: 154 mmol/L — ABNORMAL HIGH (ref 135–145)

## 2018-05-07 MED ORDER — POTASSIUM CHLORIDE CRYS ER 20 MEQ PO TBCR
40.0000 meq | EXTENDED_RELEASE_TABLET | ORAL | Status: AC
  Administered 2018-05-07 (×3): 40 meq via ORAL
  Filled 2018-05-07 (×3): qty 2

## 2018-05-07 NOTE — Clinical Social Work Note (Signed)
Clinical Social Work Assessment  Patient Details  Name: [name] MRN: [id number] Date of Birth: June 15, 1956  Date of referral:  05/07/18               Reason for consult:  Facility Placement                Permission sought to share information with:    Permission granted to share information::     Name::        Agency::  Hilda Blades at [facility]  Relationship::     Contact Information:     Housing/Transportation Living arrangements for the past 2 months:  Group Home([facility] ) Source of Information:  Facility Patient Interpreter Needed:  None Criminal Activity/Legal Involvement Pertinent to Current Situation/Hospitalization:  No - Comment as needed Significant Relationships:  None Lives with:  Facility Resident Do you feel safe going back to the place where you live?  Yes Need for family participation in patient care:  Yes (Comment)  Care giving concerns:  No concerns identified.    Social Worker assessment / plan:  Patient has been at the facility since 12/2015. Patient is wheelchair bound. Patient can feed himself. All ADLs are maximum assistance with staff.   Employment status:  Disabled (Comment on whether or not currently receiving Disability) Insurance information:  Programmer, applications, Medicaid In [place] PT Recommendations:  Not assessed at this time Information / Referral to community resources:     Patient/Family's Response to care:  Patient is a long term resident.   Patient/Family's Understanding of and Emotional Response to Diagnosis, Current Treatment, and Prognosis:  Facility has been aprised of patient's diagnosis, treatment and prognosis.   Emotional Assessment Appearance:  Appears stated age Attitude/Demeanor/Rapport:    Affect (typically observed):  Accepting, Calm Orientation:  Oriented to Self Alcohol / Substance use:  Not Applicable Psych involvement (Current and /or in the community):  No (Comment)  Discharge Needs  Concerns to be addressed:   No discharge needs identified Readmission within the last 30 days:  No Current discharge risk:  None Barriers to Discharge:  No Barriers Identified   Ihor Gully, LCSW 05/07/2018, 4:03 PM

## 2018-05-07 NOTE — Care Management Important Message (Signed)
Important Message  Patient Details  Name: [name] MRN: [id number] Date of Birth: 02-20-1956   Medicare Important Message Given:  Yes    Shelda Altes 05/07/2018, 11:44 AM

## 2018-05-07 NOTE — Progress Notes (Signed)
At 2041 pt's BP 73/56 and heart rate is 52 due to patient's mental orientation and limited word use I am unable to assess if he is symptomatic .  I notified K. Schorr notified and new orders were given and carried out.  BP now  93/80.  Nursing staff to continue to monitor.

## 2018-05-07 NOTE — Progress Notes (Signed)
[facility]  Patient Status: Active (receiving services up to time of hospitalization)  [facility] is providing the following services: RN  If patient discharges after hours, please call [phone].   [place] 05/07/2018, 4:03 PM

## 2018-05-07 NOTE — Progress Notes (Signed)
PROGRESS NOTE    [name]  EVO:[id number] DOB: [birth year] DOA: 05/05/2018 PCP: Remi Haggard, FNP     Brief Narrative:  62 year old man admitted from family care home on 6/26 due to confusion.  He has a history of schizophrenia, diabetes, and hypertension.  Upon arrival he was noted to have a sodium level of 178.  He also has a history of chronic systolic heart failure with a known ejection fraction of 15%.  Admission was requested for further evaluation and management.  He was also noted on admission to have acute renal failure postop.   Assessment & Plan:   Principal Problem:   Acute hypernatremia Active Problems:   Metabolic encephalopathy   Protein-calorie malnutrition, severe   Schizophrenia ([place])   ARF (acute renal failure) (HCC)   Acute hypernatremia -Sodium was markedly elevated to 178 on admission, down to 154 on 6/28 with administration of D5W. -Continue gentle hydration with D5W at 75 cc an hour given history of severe systolic heart failure and continue to hold diuretic therapy. -Despite IV fluids, patient continues to appear clinically dry without lower extremity edema or lung crackles.  Acute metabolic encephalopathy -Secondary to severe hypernatremia. -This appears to be improving as per my examination today.  Chronic systolic heart failure -Known ejection fraction of 15%. -Monitor volume status closely given his need for continued IV fluids given severe dehydration and hypernatremia. -He continues to appear hypovolemic on exam, okay to continue fluids.  Severe protein caloric malnutrition -Appreciate dietitian input and recommendations.  Schizophrenia -Mood is currently stable, does not appear to be on any chronic mood stabilizer medications.  Acute renal failure -Suspect due to ATN, prerenal azotemia and excessive diuretic use. -Baseline is 1.10, 2.03 on admission and down to 1.61 on 6/27, 1.43 on 6/28.   DVT prophylaxis: Lovenox Code Status:  DNR Family Communication: Patient only Disposition Plan: Back to family care home once sodium improving and medically stable  Consultants:   None  Procedures:   None  Antimicrobials:  Anti-infectives (From admission, onward)   None       Subjective: Patient is lying in bed, is awake and alert, can tell me his name where he is in the month.  He has no complaints.  Objective: Vitals:   05/06/18 2041 05/06/18 2141 05/07/18 0335 05/07/18 0519  BP: (!) 73/56 93/80 (!) 75/63 91/66  Pulse: (!) 52 (!) 58 (!) 46 66  Resp:      Temp: 98.4 F (36.9 C)   98.3 F (36.8 C)  TempSrc: Oral   Oral  SpO2: 93%     Weight:        Intake/Output Summary (Last 24 hours) at 05/07/2018 0841 Last data filed at 05/07/2018 0600 Gross per 24 hour  Intake 1380 ml  Output 1100 ml  Net 280 ml   Filed Weights   05/05/18 1655  Weight: 56 kg (123 lb 7.3 oz)    Examination:  General exam: Awake, drowsy but oriented x 3 Respiratory system: Clear to auscultation. Respiratory effort normal. Cardiovascular system:RRR. No murmurs, rubs, gallops. Gastrointestinal system: Abdomen is nondistended, soft and nontender. No organomegaly or masses felt. Normal bowel sounds heard. Central nervous system: Alert and oriented. No focal neurological deficits. Extremities: No C/C/E, +pedal pulses Skin: No rashes, lesions or ulcers Psychiatry: Judgement and insight appear normal. Mood & affect appropriate.     Data Reviewed: I have personally reviewed following labs and imaging studies  CBC: Recent Labs  Lab 05/05/18 1141 05/06/18 0412  WBC 3.7* 4.6  NEUTROABS 2.0  --   HGB 10.8* 10.7*  HCT 35.7* 35.2*  MCV 106.9* 106.0*  PLT 98* 95*   Basic Metabolic Panel: Recent Labs  Lab 05/05/18 1141 05/05/18 1323 05/05/18 1510 05/06/18 0412 05/07/18 0449  NA 178* 178* 176* 170* 154*  K 3.9  --   --  3.4* 2.8*  CL >130*  --   --  >130* 126*  CO2 25  --   --  25 22  GLUCOSE 150*  --   --  101* 161*    BUN 53*  --   --  46* 32*  CREATININE 2.04*  --   --  1.61* 1.43*  CALCIUM 9.0  --   --  8.8* 8.4*   GFR: Estimated Creatinine Clearance: 43.5 mL/min (A) (by C-G formula based on SCr of 1.43 mg/dL (H)). Liver Function Tests: Recent Labs  Lab 05/05/18 1141  AST 99*  ALT 95*  ALKPHOS 63  BILITOT 0.6  PROT 7.7  ALBUMIN 4.2   No results for input(s): LIPASE, AMYLASE in the last 168 hours. Recent Labs  Lab 05/05/18 1141  AMMONIA 23   Coagulation Profile: Recent Labs  Lab 05/05/18 1141  INR 1.37   Cardiac Enzymes: No results for input(s): CKTOTAL, CKMB, CKMBINDEX, TROPONINI in the last 168 hours. BNP (last 3 results) No results for input(s): PROBNP in the last 8760 hours. HbA1C: No results for input(s): HGBA1C in the last 72 hours. CBG: No results for input(s): GLUCAP in the last 168 hours. Lipid Profile: No results for input(s): CHOL, HDL, LDLCALC, TRIG, CHOLHDL, LDLDIRECT in the last 72 hours. Thyroid Function Tests: Recent Labs    05/05/18 1141  TSH 1.595   Anemia Panel: Recent Labs    05/05/18 1141  VITAMINB12 629   Urine analysis:    Component Value Date/Time   COLORURINE YELLOW 05/05/2018 1233   APPEARANCEUR HAZY (A) 05/05/2018 1233   LABSPEC 1.011 05/05/2018 1233   PHURINE 5.0 05/05/2018 1233   GLUCOSEU NEGATIVE 05/05/2018 1233   HGBUR SMALL (A) 05/05/2018 1233   BILIRUBINUR NEGATIVE 05/05/2018 1233   KETONESUR NEGATIVE 05/05/2018 1233   PROTEINUR NEGATIVE 05/05/2018 1233   NITRITE NEGATIVE 05/05/2018 1233   LEUKOCYTESUR NEGATIVE 05/05/2018 1233   Sepsis Labs: @LABRCNTIP (procalcitonin:4,lacticidven:4)  ) Recent Results (from the past 240 hour(s))  MRSA PCR Screening     Status: None   Collection Time: 05/05/18  6:16 PM  Result Value Ref Range Status   MRSA by PCR NEGATIVE NEGATIVE Final    Comment:        The GeneXpert MRSA Assay (FDA approved for NASAL specimens only), is one component of a comprehensive MRSA  colonization surveillance program. It is not intended to diagnose MRSA infection nor to guide or monitor treatment for MRSA infections. Performed at [facility], [address], [place], [place] [address]          Radiology Studies: Dg Chest 2 View  Result Date: 05/05/2018 CLINICAL DATA:  Altered mental status with cough. EXAM: CHEST - 2 VIEW COMPARISON:  03/06/2018 FINDINGS: Left-sided pacemaker unchanged. Lungs are adequately inflated and otherwise clear. Moderate stable cardiomegaly. Remainder of the exam is unchanged. IMPRESSION: No acute cardiopulmonary disease. Moderate stable cardiomegaly. Electronically Signed   By: Marin Olp M.D.   On: 05/05/2018 11:58   Ct Head Wo Contrast  Result Date: 05/05/2018 CLINICAL DATA:  Altered mental status and generalized weakness. EXAM: CT HEAD WITHOUT CONTRAST TECHNIQUE: Contiguous axial images were obtained from  the base of the skull through the vertex without intravenous contrast. COMPARISON:  02/10/2016 FINDINGS: Brain: The ventricles, cisterns and other CSF spaces compatible with minimal atrophic change. There is moderate chronic ischemic microvascular disease. 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 No evidence of mass, mass effect, shift of midline structures or acute hemorrhage. No evidence of acute infarction. Small old lacune infarct over the left lentiform nucleus. Vascular: No hyperdense vessel or unexpected calcification. Skull: Normal. Negative for fracture or focal lesion. Sinuses/Orbits: Orbits are normal. Paranasal sinuses are well developed and demonstrate mild patchy opacification and mucosal membrane thickening compatible chronic inflammatory change. Mastoid air cells are clear. Other: Soft tissue swelling over the left frontal scalp. IMPRESSION: No acute intracranial findings. Moderate chronic ischemic microvascular disease and stable atrophic change. 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 Minimal soft tissue swelling over the left frontal scalp. Mild chronic sinus inflammatory change. Electronically Signed   By: Marin Olp M.D.   On: 05/05/2018 11:45        Scheduled Meds: . aspirin  81 mg Oral Daily  . atorvastatin  10 mg Oral Daily  . clopidogrel  75 mg Oral Daily  . docusate sodium  100 mg Oral BID  . enoxaparin (LOVENOX) injection  40 mg Subcutaneous Q24H  . ferrous sulfate  325 mg Oral Q breakfast  . folic acid  1 mg Oral Daily  . levETIRAcetam  500 mg Oral BID  . midodrine  5 mg Oral BID WC  . midodrine  5 mg Oral Once  . potassium chloride  40 mEq Oral Q4H  . sacubitril-valsartan  1 tablet Oral BID  . sertraline  50 mg Oral QHS   Continuous Infusions: . dextrose 75 mL/hr at 05/07/18 0828     LOS: 2 days    Time spent: 25 minutes.     Lelon Frohlich, MD [facility] Pager [phone]  If 7PM-7AM, please contact night-coverage [email or web address] Password [facility] 05/07/2018, 8:41 AM

## 2018-05-08 DIAGNOSIS — L8990 Pressure ulcer of unspecified site, unspecified stage: Secondary | ICD-10-CM

## 2018-05-08 LAB — BASIC METABOLIC PANEL
Anion gap: 5 (ref 5–15)
BUN: 19 mg/dL (ref 6–20)
CO2: 22 mmol/L (ref 22–32)
CREATININE: 1.23 mg/dL (ref 0.61–1.24)
Calcium: 8.4 mg/dL — ABNORMAL LOW (ref 8.9–10.3)
Chloride: 121 mmol/L — ABNORMAL HIGH (ref 98–111)
GFR calc Af Amer: >60 mL/min (ref >60)
Glucose, Bld: 147 mg/dL — ABNORMAL HIGH (ref 70–99)
Potassium: 3.6 mmol/L (ref 3.5–5.1)
SODIUM: 148 mmol/L — AB (ref 135–145)

## 2018-05-08 NOTE — Progress Notes (Signed)
Daughter, [name], called to check on her father. She also requested that her father's DNR be changed to full code. The patient is confused. I paged Dr. Alden Hipp to let her know. The daughter's phone number is [phone].

## 2018-05-08 NOTE — Plan of Care (Signed)
Sodium levels decreasing

## 2018-05-08 NOTE — Progress Notes (Signed)
PROGRESS NOTE    [name]  WRU:[id number] DOB: 04-Oct-1956 DOA: 05/05/2018 PCP: Remi Haggard, FNP     Brief Narrative:  62 year old man admitted from family care home on 6/26 due to confusion.  He has a history of schizophrenia, diabetes, and hypertension.  Upon arrival he was noted to have a sodium level of 178.  He also has a history of chronic systolic heart failure with a known ejection fraction of 15%.  Admission was requested for further evaluation and management.  He was also noted on admission to have acute renal failure postop.   Assessment & Plan:   Principal Problem:   Acute hypernatremia Active Problems:   Metabolic encephalopathy   Protein-calorie malnutrition, severe   Schizophrenia ([place])   ARF (acute renal failure) (HCC)   Pressure injury of skin   Acute hypernatremia -Sodium was markedly elevated to 178 on admission, down to 148 on 6/29 with administration of D5W. -Continue gentle hydration with D5W at 75 cc an hour given history of severe systolic heart failure and continue to hold diuretic therapy. -Despite IV fluids, patient continues to appear clinically dry without lower extremity edema or lung crackles.  Acute metabolic encephalopathy -Secondary to severe hypernatremia. -This appears to be improving as per my examination today.  Chronic systolic heart failure -Known ejection fraction of 15%. -Monitor volume status closely given his need for continued IV fluids given severe dehydration and hypernatremia. -He continues to appear hypovolemic on exam, okay to continue fluids.  Severe protein caloric malnutrition -Appreciate dietitian input and recommendations.  Schizophrenia -Mood is currently stable, does not appear to be on any chronic mood stabilizer medications.  Acute renal failure -Suspect due to ATN, prerenal azotemia and excessive diuretic use. -Baseline is 1.10, 2.03 on admission and down to 1.61 on 6/27, 1.43 on 6/28, 1.23 on  6/29.   DVT prophylaxis: Lovenox Code Status: DNR Family Communication: Patient only Disposition Plan: Back to family care home once sodium improving and medically stable. Anticipate 24-48 hours.  Consultants:   None  Procedures:   None  Antimicrobials:  Anti-infectives (From admission, onward)   None       Subjective: Patient in bed, alert and awake, has no complaints.  Objective: Vitals:   05/07/18 1350 05/07/18 2039 05/07/18 2132 05/08/18 0553  BP: 92/69  (!) 85/58 93/69  Pulse: (!) 59  79 (!) 59  Resp: 16  17 19   Temp: 97.9 F (36.6 C)  98.2 F (36.8 C) 98.1 F (36.7 C)  TempSrc:   Oral Oral  SpO2: 98% 96% 97% 96%  Weight:        Intake/Output Summary (Last 24 hours) at 05/08/2018 1043 Last data filed at 05/08/2018 0433 Gross per 24 hour  Intake 1350 ml  Output 650 ml  Net 700 ml   Filed Weights   05/05/18 1655  Weight: 56 kg (123 lb 7.3 oz)    Examination:  General exam: Alert, awake, oriented x 3.  Cachectic Respiratory system: Clear to auscultation. Respiratory effort normal. Cardiovascular system:RRR. No murmurs, rubs, gallops. Gastrointestinal system: Abdomen is nondistended, soft and nontender. No organomegaly or masses felt. Normal bowel sounds heard. Central nervous system: Alert and oriented. No focal neurological deficits. Extremities: No C/C/E, +pedal pulses Skin: No rashes, lesions or ulcers Psychiatry: Judgement and insight appear normal. Mood & affect appropriate.     Data Reviewed: I have personally reviewed following labs and imaging studies  CBC: Recent Labs  Lab 05/05/18 1141 05/06/18 0412  WBC 3.7*  4.6  NEUTROABS 2.0  --   HGB 10.8* 10.7*  HCT 35.7* 35.2*  MCV 106.9* 106.0*  PLT 98* 95*   Basic Metabolic Panel: Recent Labs  Lab 05/05/18 1141 05/05/18 1323 05/05/18 1510 05/06/18 0412 05/07/18 0449 05/08/18 0807  NA 178* 178* 176* 170* 154* 148*  K 3.9  --   --  3.4* 2.8* 3.6  CL >130*  --   --  >130* 126*  121*  CO2 25  --   --  25 22 22   GLUCOSE 150*  --   --  101* 161* 147*  BUN 53*  --   --  46* 32* 19  CREATININE 2.04*  --   --  1.61* 1.43* 1.23  CALCIUM 9.0  --   --  8.8* 8.4* 8.4*   GFR: Estimated Creatinine Clearance: 50.6 mL/min (by C-G formula based on SCr of 1.23 mg/dL). Liver Function Tests: Recent Labs  Lab 05/05/18 1141  AST 99*  ALT 95*  ALKPHOS 63  BILITOT 0.6  PROT 7.7  ALBUMIN 4.2   No results for input(s): LIPASE, AMYLASE in the last 168 hours. Recent Labs  Lab 05/05/18 1141  AMMONIA 23   Coagulation Profile: Recent Labs  Lab 05/05/18 1141  INR 1.37   Cardiac Enzymes: No results for input(s): CKTOTAL, CKMB, CKMBINDEX, TROPONINI in the last 168 hours. BNP (last 3 results) No results for input(s): PROBNP in the last 8760 hours. HbA1C: No results for input(s): HGBA1C in the last 72 hours. CBG: No results for input(s): GLUCAP in the last 168 hours. Lipid Profile: No results for input(s): CHOL, HDL, LDLCALC, TRIG, CHOLHDL, LDLDIRECT in the last 72 hours. Thyroid Function Tests: Recent Labs    05/05/18 1141  TSH 1.595   Anemia Panel: Recent Labs    05/05/18 1141  VITAMINB12 629   Urine analysis:    Component Value Date/Time   COLORURINE YELLOW 05/05/2018 1233   APPEARANCEUR HAZY (A) 05/05/2018 1233   LABSPEC 1.011 05/05/2018 1233   PHURINE 5.0 05/05/2018 1233   GLUCOSEU NEGATIVE 05/05/2018 1233   HGBUR SMALL (A) 05/05/2018 1233   BILIRUBINUR NEGATIVE 05/05/2018 1233   KETONESUR NEGATIVE 05/05/2018 1233   PROTEINUR NEGATIVE 05/05/2018 1233   NITRITE NEGATIVE 05/05/2018 1233   LEUKOCYTESUR NEGATIVE 05/05/2018 1233   Sepsis Labs: @LABRCNTIP (procalcitonin:4,lacticidven:4)  ) Recent Results (from the past 240 hour(s))  MRSA PCR Screening     Status: None   Collection Time: 05/05/18  6:16 PM  Result Value Ref Range Status   MRSA by PCR NEGATIVE NEGATIVE Final    Comment:        The GeneXpert MRSA Assay (FDA approved for NASAL  specimens only), is one component of a comprehensive MRSA colonization surveillance program. It is not intended to diagnose MRSA infection nor to guide or monitor treatment for MRSA infections. Performed at [facility], [address], [place], [place] [address]          Radiology Studies: No results found.      Scheduled Meds: . aspirin  81 mg Oral Daily  . atorvastatin  10 mg Oral Daily  . clopidogrel  75 mg Oral Daily  . docusate sodium  100 mg Oral BID  . enoxaparin (LOVENOX) injection  40 mg Subcutaneous Q24H  . ferrous sulfate  325 mg Oral Q breakfast  . folic acid  1 mg Oral Daily  . levETIRAcetam  500 mg Oral BID  . midodrine  5 mg Oral BID WC  . midodrine  5 mg Oral Once  . sacubitril-valsartan  1 tablet Oral BID  . sertraline  50 mg Oral QHS   Continuous Infusions: . dextrose 75 mL/hr at 05/07/18 2255     LOS: 3 days    Time spent: 25 minutes.     Lelon Frohlich, MD [facility] Pager [phone]  If 7PM-7AM, please contact night-coverage [email or web address] Password [facility] 05/08/2018, 10:43 AM

## 2018-05-09 LAB — BASIC METABOLIC PANEL
ANION GAP: 5 (ref 5–15)
BUN: 15 mg/dL (ref 6–20)
CO2: 23 mmol/L (ref 22–32)
Calcium: 8.6 mg/dL — ABNORMAL LOW (ref 8.9–10.3)
Chloride: 118 mmol/L — ABNORMAL HIGH (ref 98–111)
Creatinine, Ser: 1.08 mg/dL (ref 0.61–1.24)
GFR calc Af Amer: >60 mL/min (ref >60)
GFR calc non Af Amer: >60 mL/min (ref >60)
Glucose, Bld: 148 mg/dL — ABNORMAL HIGH (ref 70–99)
POTASSIUM: 3.6 mmol/L (ref 3.5–5.1)
Sodium: 146 mmol/L — ABNORMAL HIGH (ref 135–145)

## 2018-05-09 NOTE — Progress Notes (Signed)
PROGRESS NOTE    [name]  TIW:[id number] DOB: 07-Nov-1956 DOA: 05/05/2018 PCP: Remi Haggard, FNP     Brief Narrative:  62 year old man admitted from family care home on 6/26 due to confusion.  He has a history of schizophrenia, diabetes, and hypertension.  Upon arrival he was noted to have a sodium level of 178.  He also has a history of chronic systolic heart failure with a known ejection fraction of 15%.  Admission was requested for further evaluation and management.  He was also noted on admission to have acute renal failure postop.   Assessment & Plan:   Principal Problem:   Acute hypernatremia Active Problems:   Metabolic encephalopathy   Protein-calorie malnutrition, severe   Schizophrenia ([place])   ARF (acute renal failure) (HCC)   Pressure injury of skin   Acute hypernatremia -Sodium was markedly elevated to 178 on admission, down to 146 on 6/30 with administration of D5W. -Continue gentle hydration with D5W at 75 cc an hour given history of severe systolic heart failure and continue to hold diuretic therapy. -Despite IV fluids, patient continues to appear clinically dry without lower extremity edema or lung crackles. -Will allow another 24 hours of IVF; then will DC and restart lasix.  Acute metabolic encephalopathy -Secondary to severe hypernatremia. -This appears to be improving as per my examination today.  Chronic systolic heart failure -Known ejection fraction of 15%. -Monitor volume status closely given his need for continued IV fluids given severe dehydration and hypernatremia. -He continues to appear hypovolemic on exam, okay to continue fluids for another 24 hours.  Severe protein caloric malnutrition -Appreciate dietitian input and recommendations.  Schizophrenia -Mood is currently stable, does not appear to be on any chronic mood stabilizer medications.  Acute renal failure -Suspect due to ATN, prerenal azotemia and excessive diuretic  use. -Baseline is 1.10, 2.03 on admission and down to 1.61 on 6/27, 1.43 on 6/28, 1.23 on 6/29, 1.08 on 6/30.   DVT prophylaxis: Lovenox Code Status: DNR Family Communication: Patient only Disposition Plan: Back to family care home once sodium improving and medically stable. Anticipate 24 hours.  Consultants:   None  Procedures:   None  Antimicrobials:  Anti-infectives (From admission, onward)   None       Subjective: Lying in bed, much more awake, not completely interactive but can answer my questions, he states he does not have any complaints, no chest pain or shortness of breath.  Objective: Vitals:   05/08/18 1441 05/08/18 1928 05/08/18 2114 05/09/18 0500  BP: 102/77  95/70 (!) 82/58  Pulse: 70  71 68  Resp: 16  18 17   Temp: 98.5 F (36.9 C)  97.6 F (36.4 C) 98.3 F (36.8 C)  TempSrc: Oral     SpO2:  96% 98% 97%  Weight:        Intake/Output Summary (Last 24 hours) at 05/09/2018 0759 Last data filed at 05/09/2018 0541 Gross per 24 hour  Intake 540 ml  Output 1 ml  Net 539 ml   Filed Weights   05/05/18 1655  Weight: 56 kg (123 lb 7.3 oz)    Examination:  General exam: Alert, awake, oriented x 3 Respiratory system: Clear to auscultation. Respiratory effort normal. Cardiovascular system:RRR. No murmurs, rubs, gallops. Gastrointestinal system: Abdomen is nondistended, soft and nontender. No organomegaly or masses felt. Normal bowel sounds heard. Central nervous system: Alert and oriented. No focal neurological deficits. Extremities: No C/C/E, +pedal pulses Skin: No rashes, lesions or ulcers Psychiatry:  Mood & affect appropriate, a little withdrawn.     Data Reviewed: I have personally reviewed following labs and imaging studies  CBC: Recent Labs  Lab 05/05/18 1141 05/06/18 0412  WBC 3.7* 4.6  NEUTROABS 2.0  --   HGB 10.8* 10.7*  HCT 35.7* 35.2*  MCV 106.9* 106.0*  PLT 98* 95*   Basic Metabolic Panel: Recent Labs  Lab 05/05/18 1141  05/05/18 1323 05/05/18 1510 05/06/18 0412 05/07/18 0449 05/08/18 0807  NA 178* 178* 176* 170* 154* 148*  K 3.9  --   --  3.4* 2.8* 3.6  CL >130*  --   --  >130* 126* 121*  CO2 25  --   --  25 22 22   GLUCOSE 150*  --   --  101* 161* 147*  BUN 53*  --   --  46* 32* 19  CREATININE 2.04*  --   --  1.61* 1.43* 1.23  CALCIUM 9.0  --   --  8.8* 8.4* 8.4*   GFR: Estimated Creatinine Clearance: 50.6 mL/min (by C-G formula based on SCr of 1.23 mg/dL). Liver Function Tests: Recent Labs  Lab 05/05/18 1141  AST 99*  ALT 95*  ALKPHOS 63  BILITOT 0.6  PROT 7.7  ALBUMIN 4.2   No results for input(s): LIPASE, AMYLASE in the last 168 hours. Recent Labs  Lab 05/05/18 1141  AMMONIA 23   Coagulation Profile: Recent Labs  Lab 05/05/18 1141  INR 1.37   Cardiac Enzymes: No results for input(s): CKTOTAL, CKMB, CKMBINDEX, TROPONINI in the last 168 hours. BNP (last 3 results) No results for input(s): PROBNP in the last 8760 hours. HbA1C: No results for input(s): HGBA1C in the last 72 hours. CBG: No results for input(s): GLUCAP in the last 168 hours. Lipid Profile: No results for input(s): CHOL, HDL, LDLCALC, TRIG, CHOLHDL, LDLDIRECT in the last 72 hours. Thyroid Function Tests: No results for input(s): TSH, T4TOTAL, FREET4, T3FREE, THYROIDAB in the last 72 hours. Anemia Panel: No results for input(s): VITAMINB12, FOLATE, FERRITIN, TIBC, IRON, RETICCTPCT in the last 72 hours. Urine analysis:    Component Value Date/Time   COLORURINE YELLOW 05/05/2018 1233   APPEARANCEUR HAZY (A) 05/05/2018 1233   LABSPEC 1.011 05/05/2018 1233   PHURINE 5.0 05/05/2018 1233   GLUCOSEU NEGATIVE 05/05/2018 1233   HGBUR SMALL (A) 05/05/2018 1233   BILIRUBINUR NEGATIVE 05/05/2018 1233   KETONESUR NEGATIVE 05/05/2018 1233   PROTEINUR NEGATIVE 05/05/2018 1233   NITRITE NEGATIVE 05/05/2018 1233   LEUKOCYTESUR NEGATIVE 05/05/2018 1233   Sepsis  Labs: @LABRCNTIP (procalcitonin:4,lacticidven:4)  ) Recent Results (from the past 240 hour(s))  MRSA PCR Screening     Status: None   Collection Time: 05/05/18  6:16 PM  Result Value Ref Range Status   MRSA by PCR NEGATIVE NEGATIVE Final    Comment:        The GeneXpert MRSA Assay (FDA approved for NASAL specimens only), is one component of a comprehensive MRSA colonization surveillance program. It is not intended to diagnose MRSA infection nor to guide or monitor treatment for MRSA infections. Performed at [facility], [address], [place], [place] [address]          Radiology Studies: No results found.      Scheduled Meds: . aspirin  81 mg Oral Daily  . atorvastatin  10 mg Oral Daily  . clopidogrel  75 mg Oral Daily  . docusate sodium  100 mg Oral BID  . enoxaparin (LOVENOX) injection  40 mg Subcutaneous Q24H  .  ferrous sulfate  325 mg Oral Q breakfast  . folic acid  1 mg Oral Daily  . levETIRAcetam  500 mg Oral BID  . midodrine  5 mg Oral BID WC  . midodrine  5 mg Oral Once  . sacubitril-valsartan  1 tablet Oral BID  . sertraline  50 mg Oral QHS   Continuous Infusions: . dextrose 75 mL/hr at 05/09/18 0245     LOS: 4 days    Time spent: 25 minutes.     Lelon Frohlich, MD [facility] Pager [phone]  If 7PM-7AM, please contact night-coverage [email or web address] Password [email or web address] 05/09/2018, 7:59 AM

## 2018-05-10 LAB — BASIC METABOLIC PANEL
Anion gap: 6 (ref 5–15)
BUN: 14 mg/dL (ref 6–20)
CHLORIDE: 115 mmol/L — AB (ref 98–111)
CO2: 20 mmol/L — ABNORMAL LOW (ref 22–32)
Calcium: 8.4 mg/dL — ABNORMAL LOW (ref 8.9–10.3)
Creatinine, Ser: 1.08 mg/dL (ref 0.61–1.24)
GFR calc Af Amer: >60 mL/min (ref >60)
GFR calc non Af Amer: >60 mL/min (ref >60)
GLUCOSE: 144 mg/dL — AB (ref 70–99)
Potassium: 3.5 mmol/L (ref 3.5–5.1)
SODIUM: 141 mmol/L (ref 135–145)

## 2018-05-10 MED ORDER — FUROSEMIDE 20 MG PO TABS
20.0000 mg | ORAL_TABLET | Freq: Every day | ORAL | Status: DC
  Administered 2018-05-10: 20 mg via ORAL
  Filled 2018-05-10: qty 1

## 2018-05-10 NOTE — Clinical Social Work Note (Signed)
Discharge clinicals sent to facility.   Hilda Blades at [facility] notified of discharge.    [facility] called for transport.    LCSW signing off.     Montzerrat Brunell, Clydene Pugh, LCSW

## 2018-05-10 NOTE — Discharge Summary (Signed)
Physician Discharge Summary  [name] ZOX:[id number] DOB: 1956/10/29 DOA: 05/05/2018  PCP: Larry Haggard, FNP  Admit date: 05/05/2018 Discharge date: 05/10/2018  Time spent: 45 minutes  Recommendations for Outpatient Follow-up:  -To be discharged today. -Make sure that patient stays hydrated now that we are resuming his Lasix.  Discharge Diagnoses:  Principal Problem:   Acute hypernatremia Active Problems:   Metabolic encephalopathy   Protein-calorie malnutrition, severe   Schizophrenia ([place])   ARF (acute renal failure) (HCC)   Pressure injury of skin   Discharge Condition: Stable and improved  Filed Weights   05/05/18 1655  Weight: 56 kg (123 lb 7.3 oz)    History of present illness:  [name] is a 62 y.o. male with a history of schizophrenia, diabetes, hypertension who presents to the hospital today with confusion from his skilled nursing facility.  Upon arrival to the emergency department he is found to have a sodium level of 178.  He also has a history of systolic heart failure with a known ejection fraction of 15% and has been on Lasix chronically.  Patient is really not able to give me much history and does appear confused, is not able to tell me where he is, the name of the town, he is oriented to person.  In the ED is found to be bradycardic, although this appears to be his baseline, he is not on a beta-blocker.  Creatinine is 2.04, Last known is 1.10 in April 2019.  Admission has been requested for further evaluation and management.    Hospital Course:   Acute hypernatremia -Sodium was markedly elevated to 178 on admission, down to 141 on DC with administration of D5W. -We will resume low Lasix dosing of 20 mg daily. -Important that patient be maintained well-hydrated.  Acute metabolic encephalopathy -Secondary to severe hypernatremia. -This appears to be improving as per my examination today; likely is at baseline.  Chronic systolic heart  failure -Known ejection fraction of 15%. -Has been well compensated throughout this hospitalization despite administration of IV fluids to correct his severe hypernatremia.  Severe protein caloric malnutrition -Appreciate dietitian input and recommendations.  Schizophrenia -Mood is currently stable, does not appear to be on any chronic mood stabilizer medications.  Acute renal failure -Suspect due to ATN, prerenal azotemia and excessive diuretic use. -Baseline is 1.10, 2.03 on admission and down to 1.61 on 6/27, 1.43 on 6/28, 1.23 on 6/29, 1.08 on DC.     Procedures:  None  Consultations:  None  Discharge Instructions  Discharge Instructions    Diet - low sodium heart healthy   Complete by:  As directed    Increase activity slowly   Complete by:  As directed      Allergies as of 05/10/2018   No Known Allergies     Medication List    STOP taking these medications   LORazepam 0.5 MG tablet Commonly known as:  ATIVAN     TAKE these medications   aspirin 81 MG chewable tablet Chew 1 tablet (81 mg total) by mouth daily.   atorvastatin 10 MG tablet Commonly known as:  LIPITOR Take 1 tablet (10 mg total) by mouth daily.   B-complex with vitamin C tablet Take 1 tablet by mouth daily.   clopidogrel 75 MG tablet Commonly known as:  PLAVIX Take 1 tablet (75 mg total) by mouth daily.   docusate sodium 100 MG capsule Commonly known as:  COLACE Take 100 mg by mouth 2 (two) times  daily.   ENTRESTO 24-26 MG Generic drug:  sacubitril-valsartan Take 1 tablet by mouth 2 (two) times daily.   ferrous sulfate 325 (65 FE) MG tablet Take 325 mg by mouth daily with breakfast.   folic acid 1 MG tablet Commonly known as:  FOLVITE Take 1 mg by mouth daily.   furosemide 20 MG tablet Commonly known as:  LASIX Take 20 mg by mouth daily.   levETIRAcetam 500 MG tablet Commonly known as:  KEPPRA Take 1 tablet (500 mg total) by mouth 2 (two) times daily.   midodrine  5 MG tablet Commonly known as:  PROAMATINE Take 1 tablet (5 mg total) by mouth 2 (two) times daily with a meal.   OVER THE COUNTER MEDICATION [place] fanny cream: Apply as directed as needed for rash   sertraline 50 MG tablet Commonly known as:  ZOLOFT Take 50 mg by mouth at bedtime.      No Known Allergies Follow-up Information    Larry Haggard, FNP. Schedule an appointment as soon as possible for a visit in 2 week(s).   Specialty:  Family Medicine Contact information: [place] [address] [phone]            The results of significant diagnostics from this hospitalization (including imaging, microbiology, ancillary and laboratory) are listed below for reference.    Significant Diagnostic Studies: Dg Chest 2 View  Result Date: 05/05/2018 CLINICAL DATA:  Altered mental status with cough. EXAM: CHEST - 2 VIEW COMPARISON:  03/06/2018 FINDINGS: Left-sided pacemaker unchanged. Lungs are adequately inflated and otherwise clear. Moderate stable cardiomegaly. Remainder of the exam is unchanged. IMPRESSION: No acute cardiopulmonary disease. Moderate stable cardiomegaly. Electronically Signed   By: Marin Olp M.D.   On: 05/05/2018 11:58   Ct Head Wo Contrast  Result Date: 05/05/2018 CLINICAL DATA:  Altered mental status and generalized weakness. EXAM: CT HEAD WITHOUT CONTRAST TECHNIQUE: Contiguous axial images were obtained from the base of the skull through the vertex without intravenous contrast. COMPARISON:  02/10/2016 FINDINGS: Brain: The ventricles, cisterns and other CSF spaces compatible with minimal atrophic change. There is moderate chronic ischemic microvascular disease. 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 No evidence of mass, mass effect, shift of midline structures or acute hemorrhage. No evidence of acute infarction. Small old lacune infarct over the left lentiform nucleus. Vascular: No hyperdense vessel or unexpected  calcification. Skull: Normal. Negative for fracture or focal lesion. Sinuses/Orbits: Orbits are normal. Paranasal sinuses are well developed and demonstrate mild patchy opacification and mucosal membrane thickening compatible chronic inflammatory change. Mastoid air cells are clear. Other: Soft tissue swelling over the left frontal scalp. IMPRESSION: No acute intracranial findings. Moderate chronic ischemic microvascular disease and stable atrophic change. 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 Minimal soft tissue swelling over the left frontal scalp. Mild chronic sinus inflammatory change. Electronically Signed   By: Marin Olp M.D.   On: 05/05/2018 11:45    Microbiology: Recent Results (from the past 240 hour(s))  MRSA PCR Screening     Status: None   Collection Time: 05/05/18  6:16 PM  Result Value Ref Range Status   MRSA by PCR NEGATIVE NEGATIVE Final    Comment:        The GeneXpert MRSA Assay (FDA approved for NASAL specimens only), is one component of a comprehensive MRSA colonization surveillance program. It is not intended to diagnose MRSA infection nor to guide or monitor treatment for MRSA infections.  Performed at [facility], [address], [place], [place] [address]      Labs: Basic Metabolic Panel: Recent Labs  Lab 05/06/18 [id number] 05/07/18 0449 05/08/18 0807 05/09/18 0603 05/10/18 0610  NA 170* 154* 148* 146* 141  K 3.4* 2.8* 3.6 3.6 3.5  CL >130* 126* 121* 118* 115*  CO2 25 22 22 23  20*  GLUCOSE 101* 161* 147* 148* 144*  BUN 46* 32* 19 15 14   CREATININE 1.61* 1.43* 1.23 1.08 1.08  CALCIUM 8.8* 8.4* 8.4* 8.6* 8.4*   Liver Function Tests: Recent Labs  Lab 05/05/18 1141  AST 99*  ALT 95*  ALKPHOS 63  BILITOT 0.6  PROT 7.7  ALBUMIN 4.2   No results for input(s): LIPASE, AMYLASE in the last 168 hours. Recent Labs  Lab 05/05/18 1141  AMMONIA 23   CBC: Recent Labs  Lab 05/05/18 1141 05/06/18 0412   WBC 3.7* 4.6  NEUTROABS 2.0  --   HGB 10.8* 10.7*  HCT 35.7* 35.2*  MCV 106.9* 106.0*  PLT 98* 95*   Cardiac Enzymes: No results for input(s): CKTOTAL, CKMB, CKMBINDEX, TROPONINI in the last 168 hours. BNP: BNP (last 3 results) Recent Labs    03/06/18 1050  BNP 2,749.0*    ProBNP (last 3 results) No results for input(s): PROBNP in the last 8760 hours.  CBG: No results for input(s): GLUCAP in the last 168 hours.     Signed:  Lelon Frohlich  [facility] Pager: [phone] 05/10/2018, 11:34 AM

## 2018-05-10 NOTE — [place] FL2 (Signed)
[place] MEDICAID FL2 LEVEL OF CARE SCREENING TOOL     IDENTIFICATION  Patient Name: [name] Birthdate: 11-10-1956 Sex: male Admission Date (Current Location): 05/05/2018  [facility] and [place] Number:  [employer] and Address:  [place] [address], [place]      Provider Number: [id number]  Attending Physician Name and Address:  Isaac Bliss, Olam Idler*  Relative Name and Phone Number:       Current Level of Care: Hospital Recommended Level of Care: [place] Prior Approval Number:    Date Approved/Denied:   PASRR Number:    Discharge Plan: Other (Comment)([facility])    Current Diagnoses: Patient Active Problem List   Diagnosis Date Noted  . Pressure injury of skin 05/08/2018  . ARF (acute renal failure) ([place]) 05/05/2018  . Hypotension 03/09/2018  . Acute on chronic systolic CHF (congestive heart failure) ([place]) 03/06/2018  . Pancytopenia ([place]) 03/06/2018  . Troponin level elevated 03/06/2018  . Schizophrenia ([place]) 03/06/2018  . History of stroke 03/06/2018  . PVD (peripheral vascular disease) ([place]) 03/06/2018  . Type 2 diabetes mellitus without complication ([place]) 24/07/7352  . Leukopenia 05/05/2017  . Protein-calorie malnutrition, severe 01/16/2017  . Acute hypernatremia 01/14/2017  . Collapse of lung   . Mucus plugging of bronchi   . Acute on chronic respiratory failure ([place])   . Pressure ulcer 02/04/2016  . Right lower lobe pneumonia ([place]) 02/01/2016  . Septic shock ([place]) 02/01/2016  . Hypernatremia 02/01/2016  . Acute renal failure (ARF) ([place]) 02/01/2016  . Lactic acidosis 02/01/2016  . Metabolic encephalopathy 29/92/4268  . Thrombocytopenia ([place]) 02/01/2016  . Anemia 02/01/2016  . Sepsis ([place]) 02/01/2016    Orientation RESPIRATION BLADDER Height & Weight     Self  Normal Incontinent Weight: 123 lb 7.3 oz (56 kg) Height:     BEHAVIORAL SYMPTOMS/MOOD NEUROLOGICAL BOWEL NUTRITION STATUS       Incontinent (heart healthy)  AMBULATORY STATUS COMMUNICATION OF NEEDS Skin   Total Care(wheelchair. Staff transfers patient. ) Verbally PU Stage and Appropriate Care                       Personal Care Assistance Level of Assistance  Bathing, Feeding, Dressing Bathing Assistance: Maximum assistance Feeding assistance: Independent Dressing Assistance: Maximum assistance     Functional Limitations Info             SPECIAL CARE FACTORS FREQUENCY                       Contractures Contractures Info: Not present    Additional Factors Info  Code Status, Allergies, Psychotropic Code Status Info: DNR Allergies Info: NKA Psychotropic Info: Zoloft, Ativan         Current Medications (05/10/2018):  This is the current hospital active medication list Current Facility-Administered Medications  Medication Dose Route Frequency Provider Last Rate Last Dose  . acetaminophen (TYLENOL) tablet 650 mg  650 mg Oral Q6H PRN Isaac Bliss, Rayford Halsted, MD       Or  . acetaminophen (TYLENOL) suppository 650 mg  650 mg Rectal Q6H PRN Isaac Bliss, Rayford Halsted, MD      . aspirin chewable tablet 81 mg  81 mg Oral Daily Isaac Bliss, Rayford Halsted, MD   81 mg at 05/10/18 1023  . atorvastatin (LIPITOR) tablet 10 mg  10 mg Oral Daily Isaac Bliss, Rayford Halsted, MD   10 mg at 05/10/18 1023  . clopidogrel (  PLAVIX) tablet 75 mg  75 mg Oral Daily Isaac Bliss, Rayford Halsted, MD   75 mg at 05/10/18 1022  . docusate sodium (COLACE) capsule 100 mg  100 mg Oral BID Isaac Bliss, Rayford Halsted, MD   100 mg at 05/10/18 1022  . enoxaparin (LOVENOX) injection 40 mg  40 mg Subcutaneous Q24H Isaac Bliss, Rayford Halsted, MD   40 mg at 05/09/18 1613  . ferrous sulfate tablet 325 mg  325 mg Oral Q breakfast Isaac Bliss, Rayford Halsted, MD   325 mg at 05/10/18 1023  . folic acid (FOLVITE) tablet 1 mg  1 mg Oral Daily Isaac Bliss, Rayford Halsted, MD   1 mg at 05/10/18 1022  . furosemide (LASIX) tablet 20 mg  20 mg  Oral Daily Isaac Bliss, Rayford Halsted, MD   20 mg at 05/10/18 1025  . levETIRAcetam (KEPPRA) tablet 500 mg  500 mg Oral BID Isaac Bliss, Rayford Halsted, MD   500 mg at 05/10/18 1023  . midodrine (PROAMATINE) tablet 5 mg  5 mg Oral BID WC Isaac Bliss, Rayford Halsted, MD   5 mg at 05/10/18 1023  . midodrine (PROAMATINE) tablet 5 mg  5 mg Oral Once Oswald Hillock, MD      . ondansetron [facility]) tablet 4 mg  4 mg Oral Q6H PRN Isaac Bliss, Rayford Halsted, MD       Or  . ondansetron [facility]) injection 4 mg  4 mg Intravenous Q6H PRN Isaac Bliss, Rayford Halsted, MD      . sacubitril-valsartan (ENTRESTO) 24-26 mg per tablet  1 tablet Oral BID Isaac Bliss, Rayford Halsted, MD   1 tablet at 05/10/18 1023  . senna-docusate (Senokot-S) tablet 1 tablet  1 tablet Oral QHS PRN Isaac Bliss, Rayford Halsted, MD      . sertraline (ZOLOFT) tablet 50 mg  50 mg Oral QHS Isaac Bliss, Rayford Halsted, MD   50 mg at 05/09/18 2326     Discharge Medications: Medication List    STOP taking these medications   LORazepam 0.5 MG tablet Commonly known as:  ATIVAN     TAKE these medications   aspirin 81 MG chewable tablet Chew 1 tablet (81 mg total) by mouth daily.   atorvastatin 10 MG tablet Commonly known as:  LIPITOR Take 1 tablet (10 mg total) by mouth daily.   B-complex with vitamin C tablet Take 1 tablet by mouth daily.   clopidogrel 75 MG tablet Commonly known as:  PLAVIX Take 1 tablet (75 mg total) by mouth daily.   docusate sodium 100 MG capsule Commonly known as:  COLACE Take 100 mg by mouth 2 (two) times daily.   ENTRESTO 24-26 MG Generic drug:  sacubitril-valsartan Take 1 tablet by mouth 2 (two) times daily.   ferrous sulfate 325 (65 FE) MG tablet Take 325 mg by mouth daily with breakfast.   folic acid 1 MG tablet Commonly known as:  FOLVITE Take 1 mg by mouth daily.   furosemide 20 MG tablet Commonly known as:  LASIX Take 20 mg by mouth daily.   levETIRAcetam 500 MG  tablet Commonly known as:  KEPPRA Take 1 tablet (500 mg total) by mouth 2 (two) times daily.   midodrine 5 MG tablet Commonly known as:  PROAMATINE Take 1 tablet (5 mg total) by mouth 2 (two) times daily with a meal.   OVER THE COUNTER MEDICATION Dermacloud fanny cream: Apply as directed as needed for rash   sertraline 50 MG tablet Commonly known as:  ZOLOFT Take 50 mg by mouth at bedtime.       Relevant Imaging Results:  Relevant Lab Results:   Additional Information    Chalice Philbert, Clydene Pugh, LCSW

## 2018-05-10 NOTE — Progress Notes (Signed)
Spoke with Education officer, museum, Water quality scientist, about discharge. She will set up transportation and notify facility of discharge, she states no report is needed due to not being a SNF. Patient is resting in bed with no complaints and no signs of distress or discomfort. Will continue to monitor.

## 2018-05-10 NOTE — Progress Notes (Signed)
Patient transported to [facility] by [facility]. Discharge packet and information sent with Ems.

## 2018-05-20 ENCOUNTER — Ambulatory Visit: Payer: Medicare Other | Admitting: Podiatry

## 2018-05-24 ENCOUNTER — Encounter: Payer: Self-pay | Admitting: Podiatry

## 2018-05-24 ENCOUNTER — Ambulatory Visit (INDEPENDENT_AMBULATORY_CARE_PROVIDER_SITE_OTHER): Payer: Medicare Other | Admitting: Podiatry

## 2018-05-24 DIAGNOSIS — L089 Local infection of the skin and subcutaneous tissue, unspecified: Secondary | ICD-10-CM | POA: Diagnosis not present

## 2018-05-24 DIAGNOSIS — B351 Tinea unguium: Secondary | ICD-10-CM

## 2018-05-24 DIAGNOSIS — D689 Coagulation defect, unspecified: Secondary | ICD-10-CM | POA: Diagnosis not present

## 2018-05-24 DIAGNOSIS — S90821A Blister (nonthermal), right foot, initial encounter: Secondary | ICD-10-CM | POA: Diagnosis not present

## 2018-05-24 DIAGNOSIS — M79674 Pain in right toe(s): Secondary | ICD-10-CM | POA: Diagnosis not present

## 2018-05-24 DIAGNOSIS — M79675 Pain in left toe(s): Secondary | ICD-10-CM | POA: Diagnosis not present

## 2018-05-24 DIAGNOSIS — E1159 Type 2 diabetes mellitus with other circulatory complications: Secondary | ICD-10-CM

## 2018-05-24 NOTE — Progress Notes (Signed)
This patient presents to the office with chief complaint of long thick nails and diabetic feet.  This patient  says he is having no pain and discomfort in his feet.  This patient says he has long thick painful nails.  These nails are painful walking and wearing his shoes.[name] He presents to the office with his driver. This patient presents the office today for treatment of the  long nails.  He recently has been hospitalized and he has developed a small ulcerated area that is healing on the medial aspect of his big toe.  He also He has history of revascularization of both feet.  Patient is diabetic and taking plavix.  Previous history of decubital heel ulcers both feet. He presents to the office today for preventative foot care services due to his long nails, but he also has a blister formation on the outside  Of his right foot.  General Appearance   Non communicative and unaware of his surroundings.  Vascular  Dorsalis pedis and posterior tibial  pulses are weakly palpable  bilaterally.  Capillary return is within normal limits  bilaterally. Temperature is within normal limits  bilaterally.  Neurologic  Deferred.  Nails Thick disfigured discolored nails with subungual debris  from hallux to fifth toes bilaterally. No evidence of bacterial infection or drainage bilaterally.  Orthopedic  No limitations of motion of motion feet .  No crepitus or effusions noted.  No bony pathology or digital deformities noted.  Skin  normotropic skin with no porokeratosis noted bilaterally.  No signs of infections or ulcers noted.   Healing skin lesion medial aspect right hallux.  Blister noted dorsolateral aspect fifth MPJ right foot.  Onychomycosis  Diabetes  Blister right foot.  S/P ulcer right hallux.   Debride nails x 10.  Incision and Drainage blister right foot.  Apply silvadene DSD.  I informed the driver that the person who is evaluating his healing ulcer should oversee the healing of the incised blister.     RTC  3 months.   Gardiner Barefoot DPM

## 2018-05-25 ENCOUNTER — Inpatient Hospital Stay (HOSPITAL_COMMUNITY)
Admission: EM | Admit: 2018-05-25 | Discharge: 2018-05-27 | DRG: 641 | Disposition: A | Discharge status: Home Health | Payer: Medicare Other | Attending: Family Medicine | Admitting: Family Medicine

## 2018-05-25 ENCOUNTER — Encounter (HOSPITAL_COMMUNITY): Payer: Self-pay

## 2018-05-25 ENCOUNTER — Other Ambulatory Visit: Payer: Self-pay

## 2018-05-25 DIAGNOSIS — E119 Type 2 diabetes mellitus without complications: Secondary | ICD-10-CM

## 2018-05-25 DIAGNOSIS — Z66 Do not resuscitate: Secondary | ICD-10-CM | POA: Diagnosis present

## 2018-05-25 DIAGNOSIS — E870 Hyperosmolality and hypernatremia: Secondary | ICD-10-CM | POA: Diagnosis present

## 2018-05-25 DIAGNOSIS — I130 Hypertensive heart and chronic kidney disease with heart failure and stage 1 through stage 4 chronic kidney disease, or unspecified chronic kidney disease: Secondary | ICD-10-CM | POA: Diagnosis present

## 2018-05-25 DIAGNOSIS — E079 Disorder of thyroid, unspecified: Secondary | ICD-10-CM | POA: Diagnosis present

## 2018-05-25 DIAGNOSIS — E860 Dehydration: Secondary | ICD-10-CM | POA: Diagnosis present

## 2018-05-25 DIAGNOSIS — F209 Schizophrenia, unspecified: Secondary | ICD-10-CM | POA: Diagnosis present

## 2018-05-25 DIAGNOSIS — I429 Cardiomyopathy, unspecified: Secondary | ICD-10-CM | POA: Diagnosis present

## 2018-05-25 DIAGNOSIS — Z7982 Long term (current) use of aspirin: Secondary | ICD-10-CM

## 2018-05-25 DIAGNOSIS — F200 Paranoid schizophrenia: Secondary | ICD-10-CM

## 2018-05-25 DIAGNOSIS — Z79899 Other long term (current) drug therapy: Secondary | ICD-10-CM | POA: Diagnosis not present

## 2018-05-25 DIAGNOSIS — D72819 Decreased white blood cell count, unspecified: Secondary | ICD-10-CM | POA: Diagnosis present

## 2018-05-25 DIAGNOSIS — I739 Peripheral vascular disease, unspecified: Secondary | ICD-10-CM | POA: Diagnosis present

## 2018-05-25 DIAGNOSIS — Z7902 Long term (current) use of antithrombotics/antiplatelets: Secondary | ICD-10-CM | POA: Diagnosis not present

## 2018-05-25 DIAGNOSIS — L89151 Pressure ulcer of sacral region, stage 1: Secondary | ICD-10-CM | POA: Diagnosis present

## 2018-05-25 DIAGNOSIS — Z681 Body mass index (BMI) 19 or less, adult: Secondary | ICD-10-CM | POA: Diagnosis not present

## 2018-05-25 DIAGNOSIS — D61818 Other pancytopenia: Secondary | ICD-10-CM | POA: Diagnosis present

## 2018-05-25 DIAGNOSIS — F329 Major depressive disorder, single episode, unspecified: Secondary | ICD-10-CM | POA: Diagnosis present

## 2018-05-25 DIAGNOSIS — E1122 Type 2 diabetes mellitus with diabetic chronic kidney disease: Secondary | ICD-10-CM | POA: Diagnosis present

## 2018-05-25 DIAGNOSIS — E876 Hypokalemia: Secondary | ICD-10-CM | POA: Diagnosis present

## 2018-05-25 DIAGNOSIS — I5023 Acute on chronic systolic (congestive) heart failure: Secondary | ICD-10-CM | POA: Diagnosis present

## 2018-05-25 DIAGNOSIS — G9341 Metabolic encephalopathy: Secondary | ICD-10-CM | POA: Diagnosis present

## 2018-05-25 DIAGNOSIS — R001 Bradycardia, unspecified: Secondary | ICD-10-CM | POA: Diagnosis present

## 2018-05-25 DIAGNOSIS — L8990 Pressure ulcer of unspecified site, unspecified stage: Secondary | ICD-10-CM | POA: Diagnosis present

## 2018-05-25 DIAGNOSIS — E1151 Type 2 diabetes mellitus with diabetic peripheral angiopathy without gangrene: Secondary | ICD-10-CM | POA: Diagnosis present

## 2018-05-25 DIAGNOSIS — F17210 Nicotine dependence, cigarettes, uncomplicated: Secondary | ICD-10-CM | POA: Diagnosis present

## 2018-05-25 DIAGNOSIS — Z8673 Personal history of transient ischemic attack (TIA), and cerebral infarction without residual deficits: Secondary | ICD-10-CM

## 2018-05-25 DIAGNOSIS — R627 Adult failure to thrive: Secondary | ICD-10-CM | POA: Diagnosis present

## 2018-05-25 DIAGNOSIS — I509 Heart failure, unspecified: Secondary | ICD-10-CM

## 2018-05-25 DIAGNOSIS — N189 Chronic kidney disease, unspecified: Secondary | ICD-10-CM | POA: Diagnosis present

## 2018-05-25 DIAGNOSIS — I5022 Chronic systolic (congestive) heart failure: Secondary | ICD-10-CM | POA: Diagnosis not present

## 2018-05-25 DIAGNOSIS — D649 Anemia, unspecified: Secondary | ICD-10-CM | POA: Diagnosis not present

## 2018-05-25 DIAGNOSIS — I5032 Chronic diastolic (congestive) heart failure: Secondary | ICD-10-CM | POA: Diagnosis present

## 2018-05-25 LAB — CBC WITH DIFFERENTIAL/PLATELET
BASOS ABS: 0 10*3/uL (ref 0.0–0.1)
Basophils Relative: 0 %
EOS PCT: 1 %
Eosinophils Absolute: 0 10*3/uL (ref 0.0–0.7)
HCT: 30.6 % — ABNORMAL LOW (ref 39.0–52.0)
Hemoglobin: 9.8 g/dL — ABNORMAL LOW (ref 13.0–17.0)
LYMPHS PCT: 37 %
Lymphs Abs: 1.4 10*3/uL (ref 0.7–4.0)
MCH: 32.6 pg (ref 26.0–34.0)
MCHC: 32 g/dL (ref 30.0–36.0)
MCV: 101.7 fL — AB (ref 78.0–100.0)
MONO ABS: 0.2 10*3/uL (ref 0.1–1.0)
Monocytes Relative: 6 %
Neutro Abs: 2 10*3/uL (ref 1.7–7.7)
Neutrophils Relative %: 56 %
PLATELETS: 204 10*3/uL (ref 150–400)
RBC: 3.01 MIL/uL — ABNORMAL LOW (ref 4.22–5.81)
RDW: 15.2 % (ref 11.5–15.5)
WBC: 3.7 10*3/uL — ABNORMAL LOW (ref 4.0–10.5)

## 2018-05-25 LAB — BASIC METABOLIC PANEL
ANION GAP: 5 (ref 5–15)
BUN: 17 mg/dL (ref 6–20)
CALCIUM: 9.4 mg/dL (ref 8.9–10.3)
CO2: 28 mmol/L (ref 22–32)
Chloride: 124 mmol/L — ABNORMAL HIGH (ref 98–111)
Creatinine, Ser: 0.99 mg/dL (ref 0.61–1.24)
GFR calc Af Amer: >60 mL/min (ref >60)
GLUCOSE: 122 mg/dL — AB (ref 70–99)
Potassium: 3.8 mmol/L (ref 3.5–5.1)
Sodium: 157 mmol/L — ABNORMAL HIGH (ref 135–145)

## 2018-05-25 LAB — GLUCOSE, CAPILLARY: Glucose-Capillary: 75 mg/dL (ref 70–99)

## 2018-05-25 LAB — MRSA PCR SCREENING: MRSA by PCR: NEGATIVE

## 2018-05-25 LAB — CBG MONITORING, ED: GLUCOSE-CAPILLARY: 132 mg/dL — AB (ref 70–99)

## 2018-05-25 MED ORDER — LEVETIRACETAM 500 MG PO TABS
500.0000 mg | ORAL_TABLET | Freq: Two times a day (BID) | ORAL | Status: DC
  Administered 2018-05-25 – 2018-05-27 (×4): 500 mg via ORAL
  Filled 2018-05-25 (×4): qty 1

## 2018-05-25 MED ORDER — ACETAMINOPHEN 325 MG PO TABS
650.0000 mg | ORAL_TABLET | Freq: Four times a day (QID) | ORAL | Status: DC | PRN
  Administered 2018-05-25: 650 mg via ORAL
  Filled 2018-05-25: qty 2

## 2018-05-25 MED ORDER — ASPIRIN 81 MG PO CHEW
81.0000 mg | CHEWABLE_TABLET | Freq: Every day | ORAL | Status: DC
  Administered 2018-05-26 – 2018-05-27 (×2): 81 mg via ORAL
  Filled 2018-05-25 (×2): qty 1

## 2018-05-25 MED ORDER — MIDODRINE HCL 5 MG PO TABS
5.0000 mg | ORAL_TABLET | Freq: Two times a day (BID) | ORAL | Status: DC
  Administered 2018-05-25 – 2018-05-27 (×4): 5 mg via ORAL
  Filled 2018-05-25 (×4): qty 1

## 2018-05-25 MED ORDER — INSULIN ASPART 100 UNIT/ML ~~LOC~~ SOLN
0.0000 [IU] | Freq: Three times a day (TID) | SUBCUTANEOUS | Status: DC
  Administered 2018-05-26: 1 [IU] via SUBCUTANEOUS

## 2018-05-25 MED ORDER — NICOTINE 14 MG/24HR TD PT24
14.0000 mg | MEDICATED_PATCH | Freq: Every day | TRANSDERMAL | Status: DC
  Administered 2018-05-25 – 2018-05-27 (×3): 14 mg via TRANSDERMAL
  Filled 2018-05-25 (×3): qty 1

## 2018-05-25 MED ORDER — CLOPIDOGREL BISULFATE 75 MG PO TABS
75.0000 mg | ORAL_TABLET | Freq: Every day | ORAL | Status: DC
  Administered 2018-05-26 – 2018-05-27 (×2): 75 mg via ORAL
  Filled 2018-05-25 (×2): qty 1

## 2018-05-25 MED ORDER — HEPARIN SODIUM (PORCINE) 5000 UNIT/ML IJ SOLN
5000.0000 [IU] | Freq: Three times a day (TID) | INTRAMUSCULAR | Status: DC
  Administered 2018-05-25 – 2018-05-26 (×4): 5000 [IU] via SUBCUTANEOUS
  Filled 2018-05-25 (×4): qty 1

## 2018-05-25 MED ORDER — SODIUM CHLORIDE 0.45 % IV SOLN
INTRAVENOUS | Status: DC
  Administered 2018-05-25: 16:00:00 via INTRAVENOUS

## 2018-05-25 MED ORDER — PANTOPRAZOLE SODIUM 20 MG PO TBEC: 20.0000 mg | DELAYED_RELEASE_TABLET | Freq: Every day | ORAL | Status: DC

## 2018-05-25 MED ORDER — SACUBITRIL-VALSARTAN 24-26 MG PO TABS
1.0000 | ORAL_TABLET | Freq: Two times a day (BID) | ORAL | Status: DC
  Administered 2018-05-25 – 2018-05-27 (×4): 1 via ORAL
  Filled 2018-05-25 (×6): qty 1

## 2018-05-25 MED ORDER — FERROUS SULFATE 325 (65 FE) MG PO TABS
325.0000 mg | ORAL_TABLET | Freq: Every day | ORAL | Status: DC
  Administered 2018-05-26 – 2018-05-27 (×2): 325 mg via ORAL
  Filled 2018-05-25 (×2): qty 1

## 2018-05-25 MED ORDER — TRAZODONE HCL 50 MG PO TABS
25.0000 mg | ORAL_TABLET | Freq: Every evening | ORAL | Status: DC | PRN
  Administered 2018-05-26: 25 mg via ORAL
  Filled 2018-05-25: qty 1

## 2018-05-25 MED ORDER — ACETAMINOPHEN 650 MG RE SUPP: 650.0000 mg | Freq: Four times a day (QID) | RECTAL | Status: DC | PRN

## 2018-05-25 MED ORDER — ATORVASTATIN CALCIUM 10 MG PO TABS
10.0000 mg | ORAL_TABLET | Freq: Every day | ORAL | Status: DC
  Administered 2018-05-26 – 2018-05-27 (×2): 10 mg via ORAL
  Filled 2018-05-25 (×2): qty 1

## 2018-05-25 MED ORDER — SERTRALINE HCL 50 MG PO TABS
50.0000 mg | ORAL_TABLET | Freq: Every day | ORAL | Status: DC
  Administered 2018-05-25 – 2018-05-26 (×2): 50 mg via ORAL
  Filled 2018-05-25 (×2): qty 1

## 2018-05-25 MED ORDER — DOCUSATE SODIUM 100 MG PO CAPS
100.0000 mg | ORAL_CAPSULE | Freq: Two times a day (BID) | ORAL | Status: DC
  Administered 2018-05-25 – 2018-05-27 (×4): 100 mg via ORAL
  Filled 2018-05-25 (×4): qty 1

## 2018-05-25 MED ORDER — LACTATED RINGERS IV SOLN: INTRAVENOUS | Status: DC

## 2018-05-25 MED ORDER — FOLIC ACID 1 MG PO TABS
1.0000 mg | ORAL_TABLET | Freq: Every day | ORAL | Status: DC
  Administered 2018-05-26 – 2018-05-27 (×2): 1 mg via ORAL
  Filled 2018-05-25 (×2): qty 1

## 2018-05-25 MED ORDER — INSULIN ASPART 100 UNIT/ML ~~LOC~~ SOLN: 0.0000 [IU] | Freq: Every day | SUBCUTANEOUS | Status: DC

## 2018-05-25 MED ORDER — PANTOPRAZOLE SODIUM 40 MG PO TBEC
40.0000 mg | DELAYED_RELEASE_TABLET | Freq: Every day | ORAL | Status: DC
  Administered 2018-05-25 – 2018-05-27 (×3): 40 mg via ORAL
  Filled 2018-05-25 (×3): qty 1

## 2018-05-25 NOTE — ED Notes (Signed)
Attempted IV x 2. Unsuccessful. 

## 2018-05-25 NOTE — ED Notes (Signed)
Hospitalist at bedside 

## 2018-05-25 NOTE — ED Triage Notes (Signed)
Ems reports pt was admitted to hospital recently for ams.  Reports had follow up with pcp yesterday and was told today his sodium was high.  Pt resident of [place].  Caregiver says today pt's mental status is at baseline and c/o generalized weakness.  bp 106/72, hr 65, o2 sat 96%

## 2018-05-25 NOTE — ED Provider Notes (Signed)
[facility] EMERGENCY DEPARTMENT Provider Note   CSN: [id number] Arrival date & time: 05/25/18  1111     History   Chief Complaint Chief Complaint  Patient presents with  . Abnormal Lab    HPI [name] is a 62 y.o. male.  Level 5 caveat for schizophrenia.  Patient admitted to the hospital in June 2019 with a sodium greater than 170.  His blood work was recently repeated.  Sodium was still elevated.  He was sent to the emergency department for further evaluation.  Caregiver reports normal behavior.  Past medical history includes schizophrenia, stroke, encephalopathy, diabetes, heart failure, several others.  He is able to communicate, but is very hard to understand him.     Past Medical History:  Diagnosis Date  . Anemia   . Anginal pain ([place])   . CHF (congestive heart failure) ([place])   . Collapse of lung 2018  . Decubitus skin ulcer since 2016-17   bilateral heels   . Depression   . Diabetes mellitus without complication ([place])   . Discontinued smoking   . Hypertension   . Metabolic encephalopathy 0086  . Peripheral vascular disease ([place])   . Renal failure (ARF), acute on chronic (HCC)   . Schizophrenia ([place])   . Sepsis ([place]) 2017  . Stroke ([place])   . Thrombocythemia ([place]) 2017  . Thyroid disease     Patient Active Problem List   Diagnosis Date Noted  . Pressure injury of skin 05/08/2018  . ARF (acute renal failure) ([place]) 05/05/2018  . Hypotension 03/09/2018  . Acute on chronic systolic CHF (congestive heart failure) ([place]) 03/06/2018  . Pancytopenia ([place]) 03/06/2018  . Troponin level elevated 03/06/2018  . Schizophrenia ([place]) 03/06/2018  . History of stroke 03/06/2018  . PVD (peripheral vascular disease) ([place]) 03/06/2018  . Type 2 diabetes mellitus without complication ([place]) 76/19/5093  . Leukopenia 05/05/2017  . Protein-calorie malnutrition, severe 01/16/2017  . Acute hypernatremia 01/14/2017  . Collapse of lung   . Mucus plugging of bronchi   . Acute  on chronic respiratory failure ([place])   . Pressure ulcer 02/04/2016  . Right lower lobe pneumonia ([place]) 02/01/2016  . Septic shock ([place]) 02/01/2016  . Hypernatremia 02/01/2016  . Acute renal failure (ARF) ([place]) 02/01/2016  . Lactic acidosis 02/01/2016  . Metabolic encephalopathy 26/71/2458  . Thrombocytopenia ([place]) 02/01/2016  . Anemia 02/01/2016  . Sepsis ([place]) 02/01/2016    Past Surgical History:  Procedure Laterality Date  . PERIPHERAL VASCULAR CATHETERIZATION Left 05/26/2016   Procedure: Lower Extremity Angiography;  Surgeon: Algernon Huxley, MD;  Location: [facility];  Service: Cardiovascular;  Laterality: Left;  . PERIPHERAL VASCULAR CATHETERIZATION  05/26/2016   Procedure: Lower Extremity Intervention;  Surgeon: Algernon Huxley, MD;  Location: [facility];  Service: Cardiovascular;;  . PERIPHERAL VASCULAR CATHETERIZATION Right 06/05/2016   Procedure: Lower Extremity Angiography;  Surgeon: Algernon Huxley, MD;  Location: [facility];  Service: Cardiovascular;  Laterality: Right;  . periphervascular procedures          Home Medications    Prior to Admission medications   Medication Sig Start Date End Date Taking? Authorizing Provider  aspirin 81 MG chewable tablet Chew 1 tablet (81 mg total) by mouth daily. 03/10/18  Yes Dhungel, Nishant, MD  atorvastatin (LIPITOR) 10 MG tablet Take 1 tablet (10 mg total) by mouth daily. 05/26/16  Yes Dew, Erskine Squibb, MD  B Complex-C (B-COMPLEX WITH VITAMIN C) tablet Take 1 tablet by  mouth daily.   Yes [provider]  clopidogrel (PLAVIX) 75 MG tablet Take 1 tablet (75 mg total) by mouth daily. 03/09/18 03/09/19 Yes Dhungel, Nishant, MD  docusate sodium (COLACE) 100 MG capsule Take 100 mg by mouth 2 (two) times daily.   Yes [provider]  ferrous sulfate 325 (65 FE) MG tablet Take 325 mg by mouth daily with breakfast.   Yes [provider]  folic acid (FOLVITE) 1 MG tablet Take 1 mg by mouth daily.   Yes  [provider]  furosemide (LASIX) 20 MG tablet Take 20 mg by mouth daily.   Yes [provider]  levETIRAcetam (KEPPRA) 500 MG tablet Take 1 tablet (500 mg total) by mouth 2 (two) times daily. 03/09/18  Yes Dhungel, Nishant, MD  midodrine (PROAMATINE) 5 MG tablet Take 1 tablet (5 mg total) by mouth 2 (two) times daily with a meal. 03/09/18  Yes Dhungel, Nishant, MD  OVER THE COUNTER MEDICATION Dermacloud fanny cream: Apply as directed as needed for rash   Yes [provider]  sacubitril-valsartan (ENTRESTO) 24-26 MG Take 1 tablet by mouth 2 (two) times daily.   Yes [provider]  sertraline (ZOLOFT) 50 MG tablet Take 50 mg by mouth at bedtime.   Yes [provider]    Family History Family History  Family history unknown: Yes    Social History Social History   Tobacco Use  . Smoking status: Current Every Day Smoker    Packs/day: 1.00    Years: 30.00    Pack years: 30.00    Types: Cigarettes  . Smokeless tobacco: Never Used  Substance Use Topics  . Alcohol use: No    Alcohol/week: 0.0 oz    Comment:  pt unable to respond. Hx of alcohol abuse (per pt )  . Drug use: No    Comment: unknown pt UTA     Allergies   Patient has no known allergies.   Review of Systems Review of Systems  Unable to perform ROS: Psychiatric disorder     Physical Exam Updated Vital Signs BP 95/70   Pulse (!) 53   Temp 97.7 F (36.5 C) (Oral)   Resp 16   Ht 5\' 10"  (1.778 m)   Wt 55.8 kg (123 lb)   SpO2 100%   BMI 17.65 kg/m   Physical Exam  Constitutional:  Alert, attentive, mumbling speech  HENT:  Head: Normocephalic and atraumatic.  Eyes: Conjunctivae are normal.  Neck: Neck supple.  Cardiovascular: Normal rate and regular rhythm.  Pulmonary/Chest: Effort normal and breath sounds normal.  Abdominal: Soft. Bowel sounds are normal.  Musculoskeletal: Normal range of motion.  Neurological: He is alert.  Skin: Skin is warm and dry.    Psychiatric:  Flat affect  Nursing note and vitals reviewed.    ED Treatments / Results  Labs (all labs ordered are listed, but only abnormal results are displayed) Labs Reviewed  CBC WITH DIFFERENTIAL/PLATELET - Abnormal; Notable for the following components:      Result Value   WBC 3.7 (*)    RBC 3.01 (*)    Hemoglobin 9.8 (*)    HCT 30.6 (*)    MCV 101.7 (*)    All other components within normal limits  BASIC METABOLIC PANEL - Abnormal; Notable for the following components:   Sodium 157 (*)    Chloride 124 (*)    Glucose, Bld 122 (*)    All other components within normal limits  EKG EKG Interpretation  Date/Time:  Tuesday May 25 2018 11:19:01 EDT Ventricular Rate:  63 PR Interval:    QRS Duration: 160 QT Interval:  465 QTC Calculation: 476 R Axis:   -62 Text Interpretation:  Sinus rhythm Paired ventricular premature complexes Borderline prolonged PR interval Left bundle branch block Confirmed by Nat Christen [phone] on 05/25/2018 11:38:34 AM   Radiology No results found.  Procedures Procedures (including critical care time)  Medications Ordered in ED Medications  lactated ringers infusion (has no administration in time range)     Initial Impression / Assessment and Plan / ED Course  I have reviewed the triage vital signs and the nursing notes.  Pertinent labs & imaging results that were available during my care of the patient were reviewed by me and considered in my medical decision making (see chart for details).     Complex patient with a past history of hypernatremia presents with similar concern.  Uncertain etiology for elevated sodium.  He has mental health issues, but is hemodynamically stable.  Admit to general medicine.  Final Clinical Impressions(s) / ED Diagnoses   Final diagnoses:  Hypernatremia    ED Discharge Orders    None       Nat Christen, MD 05/25/18 1445

## 2018-05-25 NOTE — ED Notes (Signed)
Attempted 3 times to obtain IV site.  Pt pulls back and resist.  Charge nurse to attempt.

## 2018-05-25 NOTE — H&P (Addendum)
History and Physical  [name] TDV:[id number] DOB: [birth year] DOA: 05/25/2018  Referring physician: Nat Christen, MD PCP: Remi Haggard, FNP   Chief Complaint: Dehydration  Historian: Information taken from ED provider and records  HPI: [name] is a 62 y.o. male with a history significant for CHF, diabetes mellitus type 2, anemia, hypertension, encephalopathy, peripheral vascular disease, stroke, thyroid disease and schizophrenia. He was sent from [facility] assisted living. Patient had elevated sodium on labs with PCP today. The patient does not complain of pain and is at base line mental status. His only complaint is thirst. On his last echo his ejection fraction was 15%. He has had a prior admission for elevated sodium and altered mental status last June.  In the ED the patient's vital signs were: Temp 97.7, RR 14, HR 75, BP 93/68, SpO2 100% on room air. His labs showed Na 157, Cl 124, WBC 3.7, RBC 3.01, Hgb 9.8, and CBG of 132. This represents a slight change from last admission with Na of 170, Hgb 10.7, and RBC 3.32 on 05/06/2018. EKG was reviews and was normal. Inpatient treatment was sought for hypernatremia.   Review of Systems: All systems reviewed and apart from history of presenting illness, are negative.  Past Medical History:  Diagnosis Date  . Anemia   . Anginal pain ([place])   . CHF (congestive heart failure) ([place])   . Collapse of lung 2018  . Decubitus skin ulcer since 2016-17   bilateral heels   . Depression   . Diabetes mellitus without complication ([place])   . Discontinued smoking   . Hypertension   . Metabolic encephalopathy 0626  . Peripheral vascular disease ([place])   . Renal failure (ARF), acute on chronic (HCC)   . Schizophrenia ([place])   . Sepsis ([place]) 2017  . Stroke ([place])   . Thrombocythemia ([place]) 2017  . Thyroid disease    Past Surgical History:  Procedure Laterality Date  . PERIPHERAL VASCULAR CATHETERIZATION Left 05/26/2016   Procedure: Lower Extremity Angiography;  Surgeon: Algernon Huxley, MD;  Location: [place] [facility];  Service: Cardiovascular;  Laterality: Left;  . PERIPHERAL VASCULAR CATHETERIZATION  05/26/2016   Procedure: Lower Extremity Intervention;  Surgeon: Algernon Huxley, MD;  Location: [place] [facility];  Service: Cardiovascular;;  . PERIPHERAL VASCULAR CATHETERIZATION Right 06/05/2016   Procedure: Lower Extremity Angiography;  Surgeon: Algernon Huxley, MD;  Location: [place] [facility];  Service: Cardiovascular;  Laterality: Right;  . periphervascular procedures     Social History:  reports that he has been smoking cigarettes.  He has a 30.00 pack-year smoking history. He has never used smokeless tobacco. He reports that he does not drink alcohol or use drugs.  No Known Allergies  Family History  Family history unknown: Yes    Prior to Admission medications   Medication Sig Start Date End Date Taking? Authorizing Provider  aspirin 81 MG chewable tablet Chew 1 tablet (81 mg total) by mouth daily. 03/10/18  Yes Dhungel, Nishant, MD  atorvastatin (LIPITOR) 10 MG tablet Take 1 tablet (10 mg total) by mouth daily. 05/26/16  Yes Dew, Erskine Squibb, MD  B Complex-C (B-COMPLEX WITH VITAMIN C) tablet Take 1 tablet by mouth daily.   Yes [provider]  clopidogrel (PLAVIX) 75 MG tablet Take 1 tablet (75 mg total) by mouth daily. 03/09/18 03/09/19 Yes Dhungel, Nishant, MD  docusate sodium (COLACE) 100 MG capsule Take 100 mg by mouth 2 (two) times daily.  Yes [provider]  ferrous sulfate 325 (65 FE) MG tablet Take 325 mg by mouth daily with breakfast.   Yes [provider]  folic acid (FOLVITE) 1 MG tablet Take 1 mg by mouth daily.   Yes [provider]  furosemide (LASIX) 20 MG tablet Take 20 mg by mouth daily.   Yes [provider]  levETIRAcetam (KEPPRA) 500 MG tablet Take 1 tablet (500 mg total) by mouth 2 (two) times daily. 03/09/18  Yes Dhungel, Nishant, MD  midodrine  (PROAMATINE) 5 MG tablet Take 1 tablet (5 mg total) by mouth 2 (two) times daily with a meal. 03/09/18  Yes Dhungel, Nishant, MD  OVER THE COUNTER MEDICATION D[place] fanny cream: Apply as directed as needed for rash   Yes [provider]  sacubitril-valsartan (ENTRESTO) 24-26 MG Take 1 tablet by mouth 2 (two) times daily.   Yes [provider]  sertraline (ZOLOFT) 50 MG tablet Take 50 mg by mouth at bedtime.   Yes [provider]   Physical Exam: Vitals:   05/25/18 1223 05/25/18 1230 05/25/18 1300 05/25/18 1330  BP: 93/68 95/70 95/79  103/72  Pulse: 65 (!) 53 64 (!) 56  Resp: (!) 27 16 19 17   Temp:      TempSrc:      SpO2: 100% 100% 100% 100%  Weight:      Height:         General exam: Thin and malnourished looking patient, lying comfortably supine on the gurney in no obvious distress.  Head, eyes and ENT: Nontraumatic and normocephalic. Pupils equally reacting to light and accommodation. Oral mucosa moist.  Neck: Supple. No JVD, carotid bruit or thyromegaly.  Lymphatics: No lymphadenopathy.  Respiratory system: Clear to auscultation. No increased work of breathing.  Cardiovascular system: S1 and S2 heard, RRR. No JVD, murmurs, gallops, clicks or pedal edema.  Gastrointestinal system: Abdomen is nondistended, soft and nontender. Normal bowel sounds heard. No organomegaly or masses appreciated.  Central nervous system: Alert and oriented. No focal neurological deficits.  Extremities: Symmetric 5 x 5 power. Peripheral pulses symmetrically felt.   Skin: Stage 1 coccygeal ulcer; no rashes or other acute finding.  Musculoskeletal system: Bilateral extremity weakness; residual stroke. Mild bilateral contracture of lower extremities.   Psychiatry: Pleasant and cooperative.  Labs on Admission:  Basic Metabolic Panel: Recent Labs  Lab 05/25/18 1211  NA 157*  K 3.8  CL 124*  CO2 28  GLUCOSE 122*  BUN 17  CREATININE 0.99  CALCIUM 9.4   Liver  Function Tests: No results for input(s): AST, ALT, ALKPHOS, BILITOT, PROT, ALBUMIN in the last 168 hours. No results for input(s): LIPASE, AMYLASE in the last 168 hours. No results for input(s): AMMONIA in the last 168 hours. CBC: Recent Labs  Lab 05/25/18 1211  WBC 3.7*  NEUTROABS 2.0  HGB 9.8*  HCT 30.6*  MCV 101.7*  PLT 204   Cardiac Enzymes: No results for input(s): CKTOTAL, CKMB, CKMBINDEX, TROPONINI in the last 168 hours.  BNP (last 3 results) No results for input(s): PROBNP in the last 8760 hours. CBG: Recent Labs  Lab 05/25/18 1117  GLUCAP 132*    Radiological Exams on Admission: No results found.  EKG: Independently reviewed.   Assessment/Plan Principal Problem:   Hypernatremia Active Problems:   Dehydration, severe   Leukocytopenia   Bradycardia   Acute on chronic systolic CHF (congestive heart failure) (HCC)   Type 2 diabetes mellitus without complication (HCC)   Anemia   Pressure  ulcer   Pancytopenia ([place])   Schizophrenia ([place])   PVD (peripheral vascular disease) ([place])   1. Hypernatremia - Patient was started on lactated ringers in the ED. Lactated ringers was discontinued and patient started on 1/2 normal saline IV at 60 mL/hr for fluid repletion. With his history of CHF mild rehydration will be used to avoid fluid overload. Will follow with morning labs. A portable chest xray has been ordered for the morning. 2. Dehydration - Will rehydrate with 1/2 normal saline IV. 3. Leukocytopenia - WBC of 3.7 this visit. This is a chronic problem and patient had a WBC of 3.7 on last admission. Will follow with morning labs. 4. Bradycardia - On admission his HR was 73 and dropped to 56. Patient has an implanted Pacemaker and defibrillator. Other then a few episodes of bradycardia patient's HR has remained stable. Will monitor closely. 5. Chronic systolic CHF - currently asymptomatic. Will continue home anticoagulation and continue to monitor. 6. Type 2 diabetes  mellitus - Patient placed on sensitive sliding scale for hyperglycemic control. 7. Anemia - Chronic anemia. Patient's hgb was 10.7 on last admission. This visit Hgb 9.8. Possibly decreased due to schizophrenic medications. 8. Pressure ulcer - Stage 1 coccygeal ulcer. Will consult to wound nurse and continue to monitor.  9. Pancytopenia - Patient presents with chronic low WBC (3.7), RBC (3.01), Hgb (9.8). Currently within patient's baseline. 10. Schizophrenia - Currently at baseline. Will continue home medications. 11. PVD - Home medications continued and DVT prophylaxis started with heparin and SCDs.  DVT Prophylaxis: heparin, SCDs Code Status: DNR  Family Communication: None  Disposition Plan: Rehydrate and admission to med-surg for observation; discharge back to assisted living following return to baseline. Will consult with social work for possible SNF placement.   Time spent: 60 mins  Ashok Norris, Student AGACNP  Attending:  Pt was seen and examined with NP student Gaston Islam.  I agree with assessment, plan and documentation as noted.  Please see orders.  Pt was admitted with recurrent hypernatremia on lasix and likely with very low free water consumption.  I wonder if he needs SNF to help with nutrition and self care.  Will ask for social worker and PT to investigate further.  Consult dietitian for assistance with nutrition and free water.   Irwin Brakeman, MD [facility] Pager [phone]  If 7PM-7AM, please contact night-coverage [email or web address] Password [facility] 05/25/2018, 2:27 PM

## 2018-05-26 ENCOUNTER — Inpatient Hospital Stay (HOSPITAL_COMMUNITY): Payer: Medicare Other

## 2018-05-26 DIAGNOSIS — I5022 Chronic systolic (congestive) heart failure: Secondary | ICD-10-CM

## 2018-05-26 DIAGNOSIS — R627 Adult failure to thrive: Secondary | ICD-10-CM | POA: Diagnosis present

## 2018-05-26 DIAGNOSIS — I509 Heart failure, unspecified: Secondary | ICD-10-CM

## 2018-05-26 DIAGNOSIS — L89151 Pressure ulcer of sacral region, stage 1: Secondary | ICD-10-CM

## 2018-05-26 DIAGNOSIS — I5023 Acute on chronic systolic (congestive) heart failure: Secondary | ICD-10-CM

## 2018-05-26 LAB — COMPREHENSIVE METABOLIC PANEL WITH GFR
ALT: 28 U/L (ref 0–44)
AST: 27 U/L (ref 15–41)
Albumin: 3.5 g/dL (ref 3.5–5.0)
Alkaline Phosphatase: 58 U/L (ref 38–126)
Anion gap: 8 (ref 5–15)
BUN: 16 mg/dL (ref 6–20)
CO2: 24 mmol/L (ref 22–32)
Calcium: 8.7 mg/dL — ABNORMAL LOW (ref 8.9–10.3)
Chloride: 117 mmol/L — ABNORMAL HIGH (ref 98–111)
Creatinine, Ser: 0.98 mg/dL (ref 0.61–1.24)
GFR calc Af Amer: >60 mL/min
GFR calc non Af Amer: >60 mL/min
Glucose, Bld: 89 mg/dL (ref 70–99)
Potassium: 3.5 mmol/L (ref 3.5–5.1)
Sodium: 149 mmol/L — ABNORMAL HIGH (ref 135–145)
Total Bilirubin: 0.7 mg/dL (ref 0.3–1.2)
Total Protein: 6.6 g/dL (ref 6.5–8.1)

## 2018-05-26 LAB — CBC
HCT: 28.4 % — ABNORMAL LOW (ref 39.0–52.0)
Hemoglobin: 9.3 g/dL — ABNORMAL LOW (ref 13.0–17.0)
MCH: 32.9 pg (ref 26.0–34.0)
MCHC: 32.7 g/dL (ref 30.0–36.0)
MCV: 100.4 fL — ABNORMAL HIGH (ref 78.0–100.0)
Platelets: 163 K/uL (ref 150–400)
RBC: 2.83 MIL/uL — ABNORMAL LOW (ref 4.22–5.81)
RDW: 14.9 % (ref 11.5–15.5)
WBC: 3.7 K/uL — ABNORMAL LOW (ref 4.0–10.5)

## 2018-05-26 LAB — MAGNESIUM: Magnesium: 1.8 mg/dL (ref 1.7–2.4)

## 2018-05-26 LAB — GLUCOSE, CAPILLARY
Glucose-Capillary: 89 mg/dL (ref 70–99)
Glucose-Capillary: 73 mg/dL (ref 70–99)
Glucose-Capillary: 121 mg/dL — ABNORMAL HIGH (ref 70–99)
Glucose-Capillary: 105 mg/dL — ABNORMAL HIGH (ref 70–99)
Glucose-Capillary: 135 mg/dL — ABNORMAL HIGH (ref 70–99)

## 2018-05-26 MED ORDER — POTASSIUM CHLORIDE IN NACL 20-0.45 MEQ/L-% IV SOLN
INTRAVENOUS | Status: DC
  Administered 2018-05-26 – 2018-05-27 (×2): via INTRAVENOUS
  Filled 2018-05-26 (×3): qty 1000

## 2018-05-26 NOTE — Consult Note (Signed)
[facility] Nurse wound consult note Reason for Consult: Consult requested for sacrum; it is reported to be a stage 1 in the H&P notes.  Discussed appearance and size with bedside nurse via phone call.  She describes an area of generalized erythemia consistent with either a stage 1 pressure injury or pink dry scar tissue from a previous wound which has healed.  Intact skin, no odor, drainage, or fluctuance. Pressure Injury POA: Yes Measurement: approx 10X.5cm, no open wound or depth Dressing procedure/placement/frequency: Foam dressing to protect from further injury.  Orders provided for bedisde nurses to perform. Please re-consult if further assistance is needed.  Thank-you,  Julien Girt MSN, [place], [place], [place], [place]

## 2018-05-26 NOTE — Clinical Social Work Note (Signed)
Received CSW consult to see pt who admitted from [facility]. Reviewed pt's record. Pt was recently admitted here and full CSW assessment from that admission is copied below.   Spoke with Hilda Blades at [facility] to update. Per Hilda Blades, pt's condition remains the same and pt is able to return to the facility at dc. Pt is his own Media planner. Pt will need EMS transport. Per Hilda Blades, DC summary and FL2 can be sent with pt at dc.  [facility] is following pt at the group home for nursing. RN CM is going to add [facility] LCSW order. Per MD, pt will likely be stable for dc in 1-2 days.   Will follow and assist with dc planning.   Clinical Social Work Assessment  Patient Details  Name: [name] MRN: [id number] Date of Birth: [birth year]  Date of referral:  05/07/18               Reason for consult:  Facility Placement                 Permission sought to share information with:    Permission granted to share information::                Name::                   Agency::  Hilda Blades at [facility]             Relationship::                Contact Information:     Housing/Transportation Living arrangements for the past 2 months:  Group Home([facility] ) Source of Information:  Facility Patient Interpreter Needed:  None Criminal Activity/Legal Involvement Pertinent to Current Situation/Hospitalization:  No - Comment as needed Significant Relationships:  None Lives with:  Facility Resident Do you feel safe going back to the place where you live?  Yes Need for family participation in patient care:  Yes (Comment)  Care giving concerns:  No concerns identified.    Social Worker assessment / plan:  Patient has been at the facility since 12/2015. Patient is wheelchair bound. Patient can feed himself. All ADLs are maximum assistance with staff.   Employment status:  Disabled (Comment on whether or not currently receiving Disability) Insurance information:  Programmer, applications, Medicaid  In [place] PT Recommendations:  Not assessed at this time Information / Referral to community resources:     Patient/Family's Response to care:  Patient is a long term resident.   Patient/Family's Understanding of and Emotional Response to Diagnosis, Current Treatment, and Prognosis:  Facility has been aprised of patient's diagnosis, treatment and prognosis.   Emotional Assessment Appearance:  Appears stated age Attitude/Demeanor/Rapport:    Affect (typically observed):  Accepting, Calm Orientation:  Oriented to Self Alcohol / Substance use:  Not Applicable Psych involvement (Current and /or in the community):  No (Comment)  Discharge Needs  Concerns to be addressed:  No discharge needs identified Readmission within the last 30 days:  No Current discharge risk:  None Barriers to Discharge:  No Barriers Identified   Ihor Gully, LCSW 05/07/2018, 4:03 PM

## 2018-05-26 NOTE — Plan of Care (Signed)
  Problem: Acute Rehab PT Goals(only PT should resolve) Goal: Pt Will Go Supine/Side To Sit Outcome: Progressing Flowsheets (Taken 05/26/2018 1210) Pt will go Supine/Side to Sit: with moderate assist Goal: Patient Will Perform Sitting Balance Outcome: Progressing Flowsheets (Taken 05/26/2018 1210) Patient will perform sitting balance: with bilateral UE support;3- 5 min;with moderate assist Note:  During bed to w/c transfers Goal: Pt Will Transfer Bed To Chair/Chair To Bed Outcome: Progressing Flowsheets (Taken 05/26/2018 1210) Pt will Transfer Bed to Chair/Chair to Bed: with mod assist

## 2018-05-26 NOTE — Evaluation (Signed)
Physical Therapy Evaluation Patient Details Name: [name] MRN: [id number] DOB: Oct 28, 1956 Today's Date: 05/26/2018   History of Present Illness  [name] is a 62 y.o. male with a history significant for CHF, diabetes mellitus type 2, anemia, hypertension, encephalopathy, peripheral vascular disease, chronic renal failure, stroke, thyroid disease and schizophrenia. He was sent from [facility] assisted living. Patient had elevated sodium on labs with PCP today. The patient does not complain of pain and is at base line mental status. His only complaint is thirst. On his last echo his ejection fraction was 15%. He has had a prior admission for elevated sodium and altered mental status last June.    Clinical Impression  Patient presents with severe bilateral ankle plantar flexor contractures with limited dorsiflexion, demonstrates slow labored movement to sit up at bedside, unable to stand or scoot over to chair due to generalized weakness and poor dynamic sitting balance.  Patient states he was able to scoot over to his wheelchair at home with assistance.  Patient will benefit from continued physical therapy in hospital and recommended venue below to increase strength, balance, endurance for safe ADLs and gait.    Follow Up Recommendations Home health PT    Equipment Recommendations  None recommended by PT    Recommendations for Other Services       Precautions / Restrictions Precautions Precautions: Fall Restrictions Weight Bearing Restrictions: No      Mobility  Bed Mobility Overal bed mobility: Needs Assistance Bed Mobility: Supine to Sit;Sit to Supine     Supine to sit: Max assist Sit to supine: Max assist   General bed mobility comments: slow labored  movement, Max/total assist to scoot forward and laterally at bedside  Transfers                    Ambulation/Gait                Stairs            Wheelchair Mobility    Modified  [place] (Stroke Patients Only)       Balance Overall balance assessment: Needs assistance Sitting-balance support: Feet supported;Bilateral upper extremity supported Sitting balance-Leahy Scale: Fair                                       Pertinent Vitals/Pain Pain Assessment: No/denies pain    Home Living Family/patient expects to be discharged to:: Group home                      Prior Function Level of Independence: Needs assistance   Gait / Transfers Assistance Needed: non ambulatory, assisted for transfers by scooting over to wheelchair per patient  ADL's / Homemaking Assistance Needed: assisted by group home staff        Hand Dominance        Extremity/Trunk Assessment   Upper Extremity Assessment Upper Extremity Assessment: Generalized weakness    Lower Extremity Assessment Lower Extremity Assessment: Generalized weakness;RLE deficits/detail;LLE deficits/detail RLE Deficits / Details: grossly 0-1/5 with severe ankle plantar flexor contractures, moderate hamstring contractures LLE Deficits / Details: grossly 0-1/5 with sever ankle plantar flexor contractures, moderate hamstring contractures    Cervical / Trunk Assessment Cervical / Trunk Assessment: Kyphotic  Communication   Communication: No difficulties  Cognition Arousal/Alertness: Awake/alert Behavior During Therapy: WFL for tasks assessed/performed Overall Cognitive Status: No family/caregiver present  to determine baseline cognitive functioning                                        General Comments      Exercises     Assessment/Plan    PT Assessment Patient needs continued PT services  PT Problem List Decreased strength;Decreased range of motion;Decreased activity tolerance;Decreased balance;Decreased mobility       PT Treatment Interventions Functional mobility training;Therapeutic activities;Therapeutic exercise;Patient/family education;Wheelchair  mobility training    PT Goals (Current goals can be found in the Care Plan section)  Acute Rehab PT Goals Patient Stated Goal: return to group home with staff to assist PT Goal Formulation: With patient Time For Goal Achievement: 06/02/18 Potential to Achieve Goals: Good    Frequency Min 3X/week   Barriers to discharge        Co-evaluation               AM-PAC PT "6 Clicks" Daily Activity  Outcome Measure Difficulty turning over in bed (including adjusting bedclothes, sheets and blankets)?: A Lot Difficulty moving from lying on back to sitting on the side of the bed? : A Lot Difficulty sitting down on and standing up from a chair with arms (e.g., wheelchair, bedside commode, etc,.)?: Unable Help needed moving to and from a bed to chair (including a wheelchair)?: Total Help needed walking in hospital room?: Total Help needed climbing 3-5 steps with a railing? : Total 6 Click Score: 8    End of Session   Activity Tolerance: Patient tolerated treatment well;Patient limited by fatigue Patient left: in bed;with call bell/phone within reach;with bed alarm set Nurse Communication: Mobility status PT Visit Diagnosis: Muscle weakness (generalized) (M62.81);Difficulty in walking, not elsewhere classified (R26.2);Unsteadiness on feet (R26.81)    Time: 8309-4076 PT Time Calculation (min) (ACUTE ONLY): 28 min   Charges:   PT Evaluation $PT Eval Moderate Complexity: 1 Mod PT Treatments $Therapeutic Activity: 23-37 mins   PT G Codes:        12:07 PM, 06-17-2018 Lonell Grandchild, MPT Physical Therapist with [facility] [phone] office [phone] mobile phone

## 2018-05-26 NOTE — Progress Notes (Signed)
Pt had a WOC consult for area on his sacrum. Area seems to be a healing area of tissue, if not a scar of what was a pressure ulcer. Spoke with the wound nurse, only recommendations is to keep a sacral foam on it. Measurements obtained, 10 cm length and 0.5 cm width.

## 2018-05-26 NOTE — Progress Notes (Signed)
PROGRESS NOTE    [name]  GUR:[id number]  DOB: 1956-02-06  DOA: 05/25/2018 PCP: Remi Haggard, FNP   Brief Admission Hx:  [name] is a 62 y.o. male with a history significant for CHF, diabetes mellitus type 2, anemia, hypertension, encephalopathy, peripheral vascular disease, chronic renal failure, stroke, thyroid disease and schizophrenia. He was sent from [facility] assisted living. Patient had elevated sodium on labs with PCP today. The patient does not complain of pain and is at base line mental status. His only complaint is thirst. On his last echo his ejection fraction was 15%. He has had a prior admission for elevated sodium and altered mental status last June. Na this visit was 157 and inpatient admission was sought.    MDM/Assessment & Plan:    Hospital Problems: Principal Problem:   Hypernatremia Active Problems:   Dehydration, severe   Leukocytopenia   Bradycardia   Acute on chronic systolic CHF (congestive heart failure) (HCC)   Type 2 diabetes mellitus without complication (HCC)   Anemia   Pressure ulcer   Pancytopenia (HCC)   Schizophrenia (HCC)   PVD (peripheral vascular disease) (HCC)   FTT (failure to thrive) in adult  1. Hypernatremia - Patient was started on lactated ringers in the ED. Lactated ringers was discontinued and patient started on 1/2 normal saline IV at 60 mL/hr for fluid repletion. With his history of CHF mild rehydration will be used to avoid fluid overload. Morning labs show improvement in Na with drop from 157 to 149. A portable chest xray has been done; results still pending. 2. Dehydration - Will rehydrate with 1/2 normal saline IV. Patient is still thirsty and unable to give himself nourishment. Have placed orders for patient to be assisted with feeding. 3. Leukocytopenia - WBC of 3.7 this visit. This is a chronic problem and patient had a WBC of 3.7 on last admission. Will follow with morning labs. 4. Bradycardia - On  admission his HR was 73 and dropped to 56. Patient has an implanted Pacemaker and defibrillator. Other then a few episodes of bradycardia patient's HR has remained stable. Will monitor closely. Continues to be stable. 5. Acute on Chronic CHF - currently asymptomatic. Will continue home anticoagulation and continue to monitor. 6. Type 2 diabetes mellitus - Patient placed on sensitive sliding scale for hyperglycemic control. CBGs are stable on sliding scale. 7. Anemia - Chronic anemia. Patient's hgb was 10.7 on last admission. This visit Hgb 9.8. Possibly decreased due to schizophrenic medications. 8. Pressure ulcer - Stage 1 coccygeal ulcer. Will consult to wound nurse and continue to monitor.  9. Pancytopenia - Patient presents with chronic low WBC (3.7), RBC (3.01), Hgb (9.8). Currently within patient's baseline. 10. Schizophrenia - Currently at baseline. Will continue home medications. 11. PVD - Home medications continued and DVT prophylaxis started with heparin and SCDs. 12. Failure to thrive - Patient come into hospital dehydrated. Patient is unable to feed himself or drink unless food/fluid is held for him. Have ordered for patient to be assisted with feedings. Have ordered PT consult to eval and treat.    DVT prophylaxis: heparin, SCDs Code Status: DNR Family Communication: None Disposition Plan: Consult to social work for SNF placement upon discharge.    Consultants:  Pt eval and treat  Social work   Subjective: Patient states he is in no pain, but is thirsty.  Objective: Vitals:   05/25/18 1527 05/25/18 2151 05/26/18 0542 05/26/18 0904  BP: 98/69 107/77 109/83 100/71  Pulse: (!) 53 (!) 59 78 92  Resp: 16 16 18    Temp: 98 F (36.7 C) 98.6 F (37 C) 98.3 F (36.8 C)   TempSrc: Oral Oral Oral   SpO2: 100% 100% 98%   Weight: 59.1 kg (130 lb 4.7 oz)  65.2 kg (143 lb 11.8 oz)   Height:        Intake/Output Summary (Last 24 hours) at 05/26/2018 1111 Last data filed at  05/26/2018 0900 Gross per 24 hour  Intake 2039 ml  Output 300 ml  Net 1739 ml   Filed Weights   05/25/18 1114 05/25/18 1527 05/26/18 0542  Weight: 55.8 kg (123 lb) 59.1 kg (130 lb 4.7 oz) 65.2 kg (143 lb 11.8 oz)     REVIEW OF SYSTEMS  As per history otherwise all reviewed and reported negative  Exam:  General exam: Patient is thin and malnourished looking, alert, cooperative. NAD. Respiratory system: Clear. No increased work of breathing. Cardiovascular system: S1 & S2 heard, RRR. No JVD, murmurs, gallops, clicks or pedal edema. Gastrointestinal system: Abdomen is nondistended, soft and nontender. Normal bowel sounds heard. Central nervous system: Alert and oriented. No focal neurological deficits. Extremities:  Bilateral extremity weakness; residual stroke. Mild bilateral contracture of lower extremities.    .  Data Reviewed: Basic Metabolic Panel: Recent Labs  Lab 05/25/18 1211 05/26/18 0609  NA 157* 149*  K 3.8 3.5  CL 124* 117*  CO2 28 24  GLUCOSE 122* 89  BUN 17 16  CREATININE 0.99 0.98  CALCIUM 9.4 8.7*  MG  --  1.8   Liver Function Tests: Recent Labs  Lab 05/26/18 0609  AST 27  ALT 28  ALKPHOS 58  BILITOT 0.7  PROT 6.6  ALBUMIN 3.5   No results for input(s): LIPASE, AMYLASE in the last 168 hours. No results for input(s): AMMONIA in the last 168 hours. CBC: Recent Labs  Lab 05/25/18 1211 05/26/18 0609  WBC 3.7* 3.7*  NEUTROABS 2.0  --   HGB 9.8* 9.3*  HCT 30.6* 28.4*  MCV 101.7* 100.4*  PLT 204 163   Cardiac Enzymes: No results for input(s): CKTOTAL, CKMB, CKMBINDEX, TROPONINI in the last 168 hours. CBG (last 3)  Recent Labs    05/25/18 1612 05/25/18 2150 05/26/18 0751  GLUCAP 75 89 73   Recent Results (from the past 240 hour(s))  MRSA PCR Screening     Status: None   Collection Time: 05/25/18  3:59 PM  Result Value Ref Range Status   MRSA by PCR NEGATIVE NEGATIVE Final    Comment:        The GeneXpert MRSA Assay  (FDA approved for NASAL specimens only), is one component of a comprehensive MRSA colonization surveillance program. It is not intended to diagnose MRSA infection nor to guide or monitor treatment for MRSA infections. Performed at [facility], [address] [place], [place] [address]      Studies: No results found.   Scheduled Meds: . aspirin  81 mg Oral Daily  . atorvastatin  10 mg Oral Daily  . clopidogrel  75 mg Oral Daily  . docusate sodium  100 mg Oral BID  . ferrous sulfate  325 mg Oral Q breakfast  . folic acid  1 mg Oral Daily  . heparin  5,000 Units Subcutaneous Q8H  . insulin aspart  0-5 Units Subcutaneous QHS  . insulin aspart  0-9 Units Subcutaneous TID WC  . levETIRAcetam  500 mg Oral BID  . midodrine  5  mg Oral BID WC  . nicotine  14 mg Transdermal Daily  . pantoprazole  40 mg Oral Daily  . sacubitril-valsartan  1 tablet Oral BID  . sertraline  50 mg Oral QHS   Continuous Infusions: . sodium chloride 60 mL/hr at 05/25/18 1601    Principal Problem:   Hypernatremia Active Problems:   Dehydration, severe   Leukocytopenia   Bradycardia   Acute on chronic systolic CHF (congestive heart failure) (HCC)   Type 2 diabetes mellitus without complication (HCC)   Anemia   Pressure ulcer   Pancytopenia (HCC)   Schizophrenia (HCC)   PVD (peripheral vascular disease) (HCC)   FTT (failure to thrive) in adult   Time spent:   Ashok Norris, Student AGACNP  Attending:  Irwin Brakeman, MD, FAAFP [facility] Pager [phone]  If 7PM-7AM, please contact night-coverage [email or web address] Password [email or web address] 05/26/2018, 11:11 AM    LOS: 1 day

## 2018-05-26 NOTE — Care Management Note (Signed)
Case Management Note  Patient Details  Name: [name] MRN: [id number] Date of Birth: 1955-12-02  Subjective/Objective: Hypernatremia. From a [facility]. Active with [facility] for nursing services.                    Action/Plan: DC back to Care home with resumption of services. Will add SW to services to assess patient for needs-? Needing higher level.   Expected Discharge Date:  05/28/18               Expected Discharge Plan:  [place]  In-House Referral:  Clinical Social Work  Discharge planning Services  CM Consult  Post Acute Care Choice:  Home Health, Resumption of Svcs/PTA Provider Choice offered to:  Patient  DME Arranged:    DME Agency:     HH Arranged:    [place] Agency:  [place]  Status of Service:  Completed, signed off  If discussed at [place] of Stay Meetings, dates discussed:    Additional Comments:  Jenni Thew, Chauncey [place], RN 05/26/2018, 2:04 PM

## 2018-05-27 DIAGNOSIS — I739 Peripheral vascular disease, unspecified: Secondary | ICD-10-CM

## 2018-05-27 DIAGNOSIS — R627 Adult failure to thrive: Secondary | ICD-10-CM

## 2018-05-27 LAB — COMPREHENSIVE METABOLIC PANEL
ALBUMIN: 3 g/dL — AB (ref 3.5–5.0)
ALK PHOS: 56 U/L (ref 38–126)
ALT: 29 U/L (ref 0–44)
ANION GAP: 7 (ref 5–15)
AST: 26 U/L (ref 15–41)
BUN: 14 mg/dL (ref 6–20)
CO2: 22 mmol/L (ref 22–32)
Calcium: 8.5 mg/dL — ABNORMAL LOW (ref 8.9–10.3)
Chloride: 114 mmol/L — ABNORMAL HIGH (ref 98–111)
Creatinine, Ser: 0.97 mg/dL (ref 0.61–1.24)
GFR calc Af Amer: >60 mL/min (ref >60)
GFR calc non Af Amer: >60 mL/min (ref >60)
GLUCOSE: 109 mg/dL — AB (ref 70–99)
POTASSIUM: 3.4 mmol/L — AB (ref 3.5–5.1)
Sodium: 143 mmol/L (ref 135–145)
Total Bilirubin: 0.5 mg/dL (ref 0.3–1.2)
Total Protein: 6 g/dL — ABNORMAL LOW (ref 6.5–8.1)

## 2018-05-27 LAB — CBC
HEMATOCRIT: 26.8 % — AB (ref 39.0–52.0)
HEMOGLOBIN: 9.1 g/dL — AB (ref 13.0–17.0)
MCH: 33.2 pg (ref 26.0–34.0)
MCHC: 34 g/dL (ref 30.0–36.0)
MCV: 97.8 fL (ref 78.0–100.0)
PLATELETS: 171 10*3/uL (ref 150–400)
RBC: 2.74 MIL/uL — ABNORMAL LOW (ref 4.22–5.81)
RDW: 14.7 % (ref 11.5–15.5)
WBC: 3.7 10*3/uL — ABNORMAL LOW (ref 4.0–10.5)

## 2018-05-27 LAB — GLUCOSE, CAPILLARY
GLUCOSE-CAPILLARY: 109 mg/dL — AB (ref 70–99)
GLUCOSE-CAPILLARY: 92 mg/dL (ref 70–99)

## 2018-05-27 LAB — MAGNESIUM: MAGNESIUM: 1.7 mg/dL (ref 1.7–2.4)

## 2018-05-27 MED ORDER — POTASSIUM CHLORIDE 10 MEQ/100ML IV SOLN
10.0000 meq | INTRAVENOUS | Status: AC
  Administered 2018-05-27 (×3): 10 meq via INTRAVENOUS
  Filled 2018-05-27 (×2): qty 100

## 2018-05-27 NOTE — Progress Notes (Signed)
IV removed, WNL. D/C instructions discussed with patient, verbalized understanding. EMS to arrive at 1330 to transport patient back to group home.

## 2018-05-27 NOTE — Discharge Instructions (Signed)
Please assist patient with feeding at all meals and ensure that patient drinks free water at least 250 mL 3 times per day.  Please check BMP in 1 week to check on sodium and potassium levels.

## 2018-05-27 NOTE — [place] FL2 (Signed)
[place] MEDICAID FL2 LEVEL OF CARE SCREENING TOOL     IDENTIFICATION  Patient Name: [name] Birthdate: 12/09/1955 Sex: male Admission Date (Current Location): 05/25/2018  [facility] and [place] Number:  [employer] and Address:  [place] [address], [place]      Provider Number: [id number]  Attending Physician Name and Address:  Murlean Iba, MD  Relative Name and Phone Number:       Current Level of Care: Hospital Recommended Level of Care: [facility] Prior Approval Number:    Date Approved/Denied:   PASRR Number:    Discharge Plan: Other (Comment)    Current Diagnoses: Patient Active Problem List   Diagnosis Date Noted  . FTT (failure to thrive) in adult 05/26/2018  . CHF (congestive heart failure) ([place])   . Dehydration, severe 05/25/2018  . Leukocytopenia 05/25/2018  . Bradycardia 05/25/2018  . Pressure injury of skin 05/08/2018  . ARF (acute renal failure) ([place]) 05/05/2018  . Hypotension 03/09/2018  . Pancytopenia ([place]) 03/06/2018  . Troponin level elevated 03/06/2018  . Schizophrenia ([place]) 03/06/2018  . History of stroke 03/06/2018  . PVD (peripheral vascular disease) ([place]) 03/06/2018  . Type 2 diabetes mellitus without complication ([place]) 15/17/6160  . Protein-calorie malnutrition, severe 01/16/2017  . Acute hypernatremia 01/14/2017  . Collapse of lung   . Mucus plugging of bronchi   . Acute on chronic respiratory failure ([place])   . Pressure ulcer 02/04/2016  . Right lower lobe pneumonia ([place]) 02/01/2016  . Septic shock ([place]) 02/01/2016  . Hypernatremia 02/01/2016  . Acute renal failure (ARF) ([place]) 02/01/2016  . Lactic acidosis 02/01/2016  . Metabolic encephalopathy 73/71/0626  . Thrombocytopenia ([place]) 02/01/2016  . Anemia 02/01/2016  . Sepsis ([place]) 02/01/2016    Orientation RESPIRATION BLADDER Height & Weight     Self  Normal Incontinent Weight: 152 lb 5.4 oz (69.1 kg) Height:  5\' 10"   (177.8 cm)  BEHAVIORAL SYMPTOMS/MOOD NEUROLOGICAL BOWEL NUTRITION STATUS      Incontinent Diet(heart healthy, carb modified)  AMBULATORY STATUS COMMUNICATION OF NEEDS Skin   Total Care Verbally                         Personal Care Assistance Level of Assistance    Bathing Assistance: Maximum assistance Feeding assistance: Independent Dressing Assistance: Maximum assistance     Functional Limitations Info             SPECIAL CARE FACTORS FREQUENCY                       Contractures Contractures Info: Not present    Additional Factors Info    Code Status Info: DNR Allergies Info: NKA Psychotropic Info: Zoloft, Ativan         Current Medications (05/27/2018):  This is the current hospital active medication list Current Facility-Administered Medications  Medication Dose Route Frequency Provider Last Rate Last Dose  . 0.45 % NaCl with KCl 20 mEq / L infusion   Intravenous Continuous Wynetta Emery, Clanford L, MD 45 mL/hr at 05/27/18 0657    . acetaminophen (TYLENOL) tablet 650 mg  650 mg Oral Q6H PRN Wynetta Emery, Clanford L, MD   650 mg at 05/25/18 2205   Or  . acetaminophen (TYLENOL) suppository 650 mg  650 mg Rectal Q6H PRN Johnson, Clanford L, MD      . aspirin chewable tablet 81 mg  81 mg Oral Daily Johnson, Clanford  L, MD   81 mg at 05/27/18 0855  . atorvastatin (LIPITOR) tablet 10 mg  10 mg Oral Daily Johnson, Clanford L, MD   10 mg at 05/27/18 0855  . clopidogrel (PLAVIX) tablet 75 mg  75 mg Oral Daily Johnson, Clanford L, MD   75 mg at 05/27/18 0855  . docusate sodium (COLACE) capsule 100 mg  100 mg Oral BID Johnson, Clanford L, MD   100 mg at 05/27/18 0855  . ferrous sulfate tablet 325 mg  325 mg Oral Q breakfast Johnson, Clanford L, MD   325 mg at 05/27/18 0800  . folic acid (FOLVITE) tablet 1 mg  1 mg Oral Daily Johnson, Clanford L, MD   1 mg at 05/27/18 0855  . heparin injection 5,000 Units  5,000 Units Subcutaneous Q8H Johnson, Clanford L, MD   5,000 Units at  05/26/18 2046  . insulin aspart (novoLOG) injection 0-5 Units  0-5 Units Subcutaneous QHS Johnson, Clanford L, MD      . insulin aspart (novoLOG) injection 0-9 Units  0-9 Units Subcutaneous TID WC Johnson, Clanford L, MD   1 Units at 05/26/18 1224  . levETIRAcetam (KEPPRA) tablet 500 mg  500 mg Oral BID Wynetta Emery, Clanford L, MD   500 mg at 05/26/18 2046  . midodrine (PROAMATINE) tablet 5 mg  5 mg Oral BID WC Johnson, Clanford L, MD   5 mg at 05/27/18 0800  . nicotine (NICODERM CQ - dosed in mg/24 hours) patch 14 mg  14 mg Transdermal Daily Johnson, Clanford L, MD   14 mg at 05/27/18 0856  . pantoprazole (PROTONIX) EC tablet 40 mg  40 mg Oral Daily Johnson, Clanford L, MD   40 mg at 05/27/18 0855  . sacubitril-valsartan (ENTRESTO) 24-26 mg per tablet  1 tablet Oral BID Irwin Brakeman L, MD   1 tablet at 05/27/18 0855  . sertraline (ZOLOFT) tablet 50 mg  50 mg Oral QHS Johnson, Clanford L, MD   50 mg at 05/26/18 2046  . traZODone (DESYREL) tablet 25 mg  25 mg Oral QHS PRN Wynetta Emery, Clanford L, MD   25 mg at 05/26/18 2046     Discharge Medications:  STOP taking these medications   furosemide 20 MG tablet Commonly known as:  LASIX     TAKE these medications   aspirin 81 MG chewable tablet Chew 1 tablet (81 mg total) by mouth daily.   atorvastatin 10 MG tablet Commonly known as:  LIPITOR Take 1 tablet (10 mg total) by mouth daily.   B-complex with vitamin C tablet Take 1 tablet by mouth daily.   clopidogrel 75 MG tablet Commonly known as:  PLAVIX Take 1 tablet (75 mg total) by mouth daily.   docusate sodium 100 MG capsule Commonly known as:  COLACE Take 100 mg by mouth 2 (two) times daily.   ENTRESTO 24-26 MG Generic drug:  sacubitril-valsartan Take 1 tablet by mouth 2 (two) times daily.   ferrous sulfate 325 (65 FE) MG tablet Take 325 mg by mouth daily with breakfast.   folic acid 1 MG tablet Commonly known as:  FOLVITE Take 1 mg by mouth daily.    levETIRAcetam 500 MG tablet Commonly known as:  KEPPRA Take 1 tablet (500 mg total) by mouth 2 (two) times daily.   midodrine 5 MG tablet Commonly known as:  PROAMATINE Take 1 tablet (5 mg total) by mouth 2 (two) times daily with a meal.   OVER THE COUNTER MEDICATION [place] fanny cream: Apply as  directed as needed for rash   sertraline 50 MG tablet Commonly known as:  ZOLOFT Take 50 mg by mouth at bedtime.         Follow-up Information    Corey Skains, MD. Schedule an appointment as soon as possible for a visit in 2 week(s).   Specialty:  Cardiology Why:  Hospital Follow Up  Contact information: [address] [facility] [place] [address] [phone]      Relevant Imaging Results:  Relevant Lab Results:   Additional Information: Discharge Instructions    (HEART FAILURE PATIENTS) Call MD:  Anytime you have any of the following symptoms: 1) 3 pound weight gain in 24 hours or 5 pounds in 1 week 2) shortness of breath, with or without a dry hacking cough 3) swelling in the hands, feet or stomach 4) if you have to sleep on extra pillows at night in order to breathe.   Complete by:  As directed    Call MD for:  difficulty breathing, headache or visual disturbances   Complete by:  As directed    Call MD for:  extreme fatigue   Complete by:  As directed    Call MD for:  persistant dizziness or light-headedness   Complete by:  As directed    Diet - low sodium heart healthy   Complete by:  As directed    Discharge instructions   Complete by:  As directed    Please assist with all meals and please be sure patient drinks free water at least 200 mL 3 times per day. Please check BMP in 1 week to follow up on sodium and potassium levels.   Increase activity slowly   Complete by:  As directed          Shade Flood, LCSW

## 2018-05-27 NOTE — Discharge Summary (Addendum)
Physician Discharge Summary  [name] HBZ:[id number] DOB: Apr 20, 1956 DOA: 05/25/2018  PCP: Remi Haggard, FNP  Cardiologist: Manuela Schwartz date: 05/25/2018 Discharge date: 05/27/2018  Admitted From: ALF Disposition: ALF  Recommendations for Outpatient Follow-up:  1. Follow up with cardiologist in 2 weeks 2. Please obtain BMP/CBC in one week  3. Please monitor blood glucose at least 1-2 times per day and as needed if not feeling well.   Home Health: PT, RN, SW  Discharge Condition: STABLE   CODE STATUS: DNR    Brief Hospitalization Summary: Please see all hospital notes, images, labs for full details of the hospitalization.  HPI: [name] is a 62 y.o. male with a history significant for CHF, diabetes mellitus type 2, anemia, hypertension, encephalopathy, peripheral vascular disease, stroke, thyroid disease and schizophrenia. He was sent from [facility] assisted living. Patient had elevated sodium on labs with PCP today. The patient does not complain of pain and is at base line mental status. His only complaint is thirst. On his last echo his ejection fraction was 15%. He has had a prior admission for elevated sodium and altered mental status last June.  In the ED the patient's vital signs were: Temp 97.7, RR 14, HR 75, BP 93/68, SpO2 100% on room air. His labs showed Na 157, Cl 124, WBC 3.7, RBC 3.01, Hgb 9.8, and CBG of 132. This represents a slight change from last admission with Na of 170, Hgb 10.7, and RBC 3.32 on 05/06/2018. EKG was reviews and was normal. Inpatient treatment was sought for hypernatremia.    1. Hypernatremia - Patient was started on lactated ringers in the ED. Lactated ringers was discontinued and patient started on 1/2 normal saline IV at 60 mL/hr for fluid repletion. With his history of CHF mild rehydration was used to avoid fluid overload. Morning labs show improvement in Na with drop from 157 to 143 (normal range). He is eating and drinking  better. 2. Dehydration -Resolved now.  He was rehydrated with 1/2 normal saline IV. Have placed orders for patient to be assisted with feeding. 3. Leukocytopenia - WBC of 3.7 this visit. This is a chronic problem and patient had a WBC of 3.7 on last admission. Will follow with morning labs.  He has a hematology outpatient follow up appointment arranged.   4. Bradycardia -  On admission his HR was 73 and dropped to 56. Patient has an implanted Pacemaker and defibrillator. Other then a few episodes of bradycardia patient's HR has remained stable. Outpatient follow up with cardiologist in 2 weeks.  5. Chronic systolic CHF/cardiomyopathy - currently stable.  Will continue home anticoagulation and continue to monitor.  Discontinued the lasix as he is not eating and drinking regularly enough for me to trust that he won't get severely dehydrated again if he is given lasix on a daily basis.  I have made a follow up with his cardiologist.  They may need to order for his lasix to be given based on daily weight testing or some other arrangement.  Will defer to his cardiologist.  6. Type 2 diabetes mellitus - Patient placed on sensitive sliding scale for hyperglycemic control. CBGs are stable on sliding scale. 7. Anemia,  unspecified - possibly due to marrow failure from chronic malnutrition.  Patient's hgb was 10.7 on last admission. This visit Hgb 9.8.  He has outpatient hematologist appointment arranged.   8. Pressure ulcer - Stage 1 coccygeal ulcer. Barrier protection ordered.  Home health RN and PT  arranged.    9. Pancytopenia - Patient presents with chronic low WBC (3.7), RBC (3.01), Hgb (9.8). Currently within patient's baseline.  Follow up with hematology.   10. Hypokalemia - IV replacement given.  Recheck BMP in 1 week recommended.   11. Schizophrenia - Currently at baseline. Will continue home medications. 12. PVD - Home medications continued. 13. Failure to thrive - Patient come into hospital dehydrated.  Patient is unable to feed himself or drink unless food/fluid is held for him. Have ordered for patient to be assisted with feedings. PT recommending Home Health PT.  Home health PT, RN, SW arranged.  I believe that he likely will need long-term SNF in near future.    DVT prophylaxis: heparin, SCDs Code Status: DNR Family Communication: None Disposition Plan: Return to ALF    Consultants:  PT eval and treat  Discharge Diagnoses:  Principal Problem:   Hypernatremia Active Problems:   Anemia   Pressure ulcer   Pancytopenia (HCC)   Schizophrenia (HCC)   PVD (peripheral vascular disease) ([place])   Type 2 diabetes mellitus without complication (HCC)   Dehydration, severe   Leukocytopenia   Bradycardia   FTT (failure to thrive) in adult   CHF (congestive heart failure) ([place])  Discharge Instructions: Discharge Instructions    (HEART FAILURE PATIENTS) Call MD:  Anytime you have any of the following symptoms: 1) 3 pound weight gain in 24 hours or 5 pounds in 1 week 2) shortness of breath, with or without a dry hacking cough 3) swelling in the hands, feet or stomach 4) if you have to sleep on extra pillows at night in order to breathe.   Complete by:  As directed    Call MD for:  difficulty breathing, headache or visual disturbances   Complete by:  As directed    Call MD for:  extreme fatigue   Complete by:  As directed    Call MD for:  persistant dizziness or light-headedness   Complete by:  As directed    Diet - low sodium heart healthy   Complete by:  As directed    Discharge instructions   Complete by:  As directed    Please assist with all meals and please be sure patient drinks free water at least 200 mL 3 times per day. Please check BMP in 1 week to follow up on sodium and potassium levels.   Increase activity slowly   Complete by:  As directed      Allergies as of 05/27/2018   No Known Allergies     Medication List    STOP taking these medications   furosemide 20  MG tablet Commonly known as:  LASIX     TAKE these medications   aspirin 81 MG chewable tablet Chew 1 tablet (81 mg total) by mouth daily.   atorvastatin 10 MG tablet Commonly known as:  LIPITOR Take 1 tablet (10 mg total) by mouth daily.   B-complex with vitamin C tablet Take 1 tablet by mouth daily.   clopidogrel 75 MG tablet Commonly known as:  PLAVIX Take 1 tablet (75 mg total) by mouth daily.   docusate sodium 100 MG capsule Commonly known as:  COLACE Take 100 mg by mouth 2 (two) times daily.   ENTRESTO 24-26 MG Generic drug:  sacubitril-valsartan Take 1 tablet by mouth 2 (two) times daily.   ferrous sulfate 325 (65 FE) MG tablet Take 325 mg by mouth daily with breakfast.   folic acid 1 MG tablet  Commonly known as:  FOLVITE Take 1 mg by mouth daily.   levETIRAcetam 500 MG tablet Commonly known as:  KEPPRA Take 1 tablet (500 mg total) by mouth 2 (two) times daily.   midodrine 5 MG tablet Commonly known as:  PROAMATINE Take 1 tablet (5 mg total) by mouth 2 (two) times daily with a meal.   OVER THE COUNTER MEDICATION Dermacloud fanny cream: Apply as directed as needed for rash   sertraline 50 MG tablet Commonly known as:  ZOLOFT Take 50 mg by mouth at bedtime.      Follow-up Information    Corey Skains, MD. Schedule an appointment as soon as possible for a visit in 2 week(s).   Specialty:  Cardiology Why:  Hospital Follow Up  Contact information: [facility] [id number] [phone]          No Known Allergies Allergies as of 05/27/2018   No Known Allergies     Medication List    STOP taking these medications   furosemide 20 MG tablet Commonly known as:  LASIX     TAKE these medications   aspirin 81 MG chewable tablet Chew 1 tablet (81 mg total) by mouth daily.   atorvastatin 10 MG tablet Commonly known as:  LIPITOR Take 1 tablet (10 mg total) by mouth daily.   B-complex with  vitamin C tablet Take 1 tablet by mouth daily.   clopidogrel 75 MG tablet Commonly known as:  PLAVIX Take 1 tablet (75 mg total) by mouth daily.   docusate sodium 100 MG capsule Commonly known as:  COLACE Take 100 mg by mouth 2 (two) times daily.   ENTRESTO 24-26 MG Generic drug:  sacubitril-valsartan Take 1 tablet by mouth 2 (two) times daily.   ferrous sulfate 325 (65 FE) MG tablet Take 325 mg by mouth daily with breakfast.   folic acid 1 MG tablet Commonly known as:  FOLVITE Take 1 mg by mouth daily.   levETIRAcetam 500 MG tablet Commonly known as:  KEPPRA Take 1 tablet (500 mg total) by mouth 2 (two) times daily.   midodrine 5 MG tablet Commonly known as:  PROAMATINE Take 1 tablet (5 mg total) by mouth 2 (two) times daily with a meal.   OVER THE COUNTER MEDICATION Dermacloud fanny cream: Apply as directed as needed for rash   sertraline 50 MG tablet Commonly known as:  ZOLOFT Take 50 mg by mouth at bedtime.       Procedures/Studies: Dg Chest 2 View  Result Date: 05/05/2018 CLINICAL DATA:  Altered mental status with cough. EXAM: CHEST - 2 VIEW COMPARISON:  03/06/2018 FINDINGS: Left-sided pacemaker unchanged. Lungs are adequately inflated and otherwise clear. Moderate stable cardiomegaly. Remainder of the exam is unchanged. IMPRESSION: No acute cardiopulmonary disease. Moderate stable cardiomegaly. Electronically Signed   By: Marin Olp M.D.   On: 05/05/2018 11:58   Ct Head Wo Contrast  Result Date: 05/05/2018 CLINICAL DATA:  Altered mental status and generalized weakness. EXAM: CT HEAD WITHOUT CONTRAST TECHNIQUE: Contiguous axial images were obtained from the base of the skull through the vertex without intravenous contrast. COMPARISON:  02/10/2016 FINDINGS: Brain: The ventricles, cisterns and other CSF spaces compatible with minimal atrophic change. There is moderate chronic ischemic microvascular disease. There is an old left posterior parietal/watershed  infarct. No evidence of mass, mass effect, shift of midline structures or acute hemorrhage. No evidence of acute infarction. Small old lacune infarct over the left lentiform nucleus. Vascular:  No hyperdense vessel or unexpected calcification. Skull: Normal. Negative for fracture or focal lesion. Sinuses/Orbits: Orbits are normal. Paranasal sinuses are well developed and demonstrate mild patchy opacification and mucosal membrane thickening compatible chronic inflammatory change. Mastoid air cells are clear. Other: Soft tissue swelling over the left frontal scalp. IMPRESSION: No acute intracranial findings. Moderate chronic ischemic microvascular disease and stable atrophic change. 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 Minimal soft tissue swelling over the left frontal scalp. Mild chronic sinus inflammatory change. Electronically Signed   By: Marin Olp M.D.   On: 05/05/2018 11:45   Portable Chest 1 View  Result Date: 05/26/2018 CLINICAL DATA:  Congestive heart failure.  History of pneumothorax. EXAM: PORTABLE CHEST 1 VIEW COMPARISON:  05/05/2018. FINDINGS: Trachea is midline. Heart is enlarged. ICD lead tip projects over the right ventricle. Lungs are clear. No pleural fluid. IMPRESSION: No acute findings. Electronically Signed   By: Lorin Picket M.D.   On: 05/26/2018 11:29     Subjective: The patient does not have any complaints today.  He has been eating and drinking well.  Discharge Exam: Vitals:   05/26/18 2147 05/27/18 0500  BP:  100/68  Pulse:  72  Resp:  18  Temp:  98.3 F (36.8 C)  SpO2: 99% 100%   Vitals:   05/26/18 2016 05/26/18 2042 05/26/18 2147 05/27/18 0500  BP: (!) 79/58 90/64  100/68  Pulse: 74   72  Resp: 17   18  Temp: 98.1 F (36.7 C)   98.3 F (36.8 C)  TempSrc: Oral   Oral  SpO2: 100%  99% 100%  Weight:    69.1 kg (152 lb 5.4 oz)  Height:       General exam: Patient is thin and malnourished looking, alert,  cooperative. NAD. Mucus membranes moist.   Respiratory system: Clear. No increased work of breathing. Cardiovascular system: S1 & S2 heard, RRR. Soft holosystolic murmur heard.   Gastrointestinal system: Abdomen is nondistended, soft and nontender. Normal bowel sounds heard. Central nervous system: Alert and oriented. No focal neurological deficits. Extremities:  Bilateral extremity weakness; residual stroke. Mild bilateral contracture of lower extremities.   The results of significant diagnostics from this hospitalization (including imaging, microbiology, ancillary and laboratory) are listed below for reference.     Microbiology: Recent Results (from the past 240 hour(s))  MRSA PCR Screening     Status: None   Collection Time: 05/25/18  3:59 PM  Result Value Ref Range Status   MRSA by PCR NEGATIVE NEGATIVE Final    Comment:        The GeneXpert MRSA Assay (FDA approved for NASAL specimens only), is one component of a comprehensive MRSA colonization surveillance program. It is not intended to diagnose MRSA infection nor to guide or monitor treatment for MRSA infections. Performed at [facility], [address], [place],  [address]      Labs: BNP (last 3 results) Recent Labs    03/06/18 1050  BNP 6,045.4*   Basic Metabolic Panel: Recent Labs  Lab 05/25/18 1211 05/26/18 0609 05/27/18 0452  NA 157* 149* 143  K 3.8 3.5 3.4*  CL 124* 117* 114*  CO2 28 24 22   GLUCOSE 122* 89 109*  BUN 17 16 14   CREATININE 0.99 0.98 0.97  CALCIUM 9.4 8.7* 8.5*  MG  --  1.8 1.7   Liver Function Tests: Recent Labs  Lab 05/26/18 0609 05/27/18 0452  AST 27 26  ALT 28 29  ALKPHOS 58 56  BILITOT 0.7 0.5  PROT 6.6 6.0*  ALBUMIN 3.5 3.0*   No results for input(s): LIPASE, AMYLASE in the last 168 hours. No results for input(s): AMMONIA in the last 168 hours. CBC: Recent Labs  Lab 05/25/18 1211 05/26/18 0609 05/27/18 0452  WBC 3.7* 3.7* 3.7*  NEUTROABS 2.0  --   --    HGB 9.8* 9.3* 9.1*  HCT 30.6* 28.4* 26.8*  MCV 101.7* 100.4* 97.8  PLT 204 163 171   Cardiac Enzymes: No results for input(s): CKTOTAL, CKMB, CKMBINDEX, TROPONINI in the last 168 hours. BNP: Invalid input(s): POCBNP CBG: Recent Labs  Lab 05/26/18 0751 05/26/18 1114 05/26/18 1631 05/26/18 2017 05/27/18 0728  GLUCAP 73 121* 105* 135* 109*   D-Dimer No results for input(s): DDIMER in the last 72 hours. Hgb A1c No results for input(s): HGBA1C in the last 72 hours. Lipid Profile No results for input(s): CHOL, HDL, LDLCALC, TRIG, CHOLHDL, LDLDIRECT in the last 72 hours. Thyroid function studies No results for input(s): TSH, T4TOTAL, T3FREE, THYROIDAB in the last 72 hours.  Invalid input(s): FREET3 Anemia work up No results for input(s): VITAMINB12, FOLATE, FERRITIN, TIBC, IRON, RETICCTPCT in the last 72 hours. Urinalysis    Component Value Date/Time   COLORURINE YELLOW 05/05/2018 1233   APPEARANCEUR HAZY (A) 05/05/2018 1233   LABSPEC 1.011 05/05/2018 1233   PHURINE 5.0 05/05/2018 1233   GLUCOSEU NEGATIVE 05/05/2018 1233   HGBUR SMALL (A) 05/05/2018 1233   BILIRUBINUR NEGATIVE 05/05/2018 1233   KETONESUR NEGATIVE 05/05/2018 1233   PROTEINUR NEGATIVE 05/05/2018 1233   NITRITE NEGATIVE 05/05/2018 1233   LEUKOCYTESUR NEGATIVE 05/05/2018 1233   Sepsis Labs Invalid input(s): PROCALCITONIN,  WBC,  LACTICIDVEN Microbiology Recent Results (from the past 240 hour(s))  MRSA PCR Screening     Status: None   Collection Time: 05/25/18  3:59 PM  Result Value Ref Range Status   MRSA by PCR NEGATIVE NEGATIVE Final    Comment:        The GeneXpert MRSA Assay (FDA approved for NASAL specimens only), is one component of a comprehensive MRSA colonization surveillance program. It is not intended to diagnose MRSA infection nor to guide or monitor treatment for MRSA infections. Performed at [facility], [address], [place], [place] [address]    Time coordinating discharge:  33 minutes  SIGNED:  Irwin Brakeman, MD  [facility] 05/27/2018, 10:51 AM Pager [phone]  If 7PM-7AM, please contact night-coverage [email or web address] Password [email or web address]

## 2018-05-27 NOTE — Clinical Social Work Note (Signed)
LCSW following. Per MD, pt stable to return to his family care home today. Updated Debra at the [facility]. Spoke with pt's RN to update. EMS arranged for 1330. DC summary and FL2 to be sent with pt at request of the [facility]. There are no other CSW needs for dc.

## 2018-06-29 ENCOUNTER — Encounter: Payer: Self-pay | Admitting: Oncology

## 2018-06-29 ENCOUNTER — Inpatient Hospital Stay (HOSPITAL_BASED_OUTPATIENT_CLINIC_OR_DEPARTMENT_OTHER): Payer: Medicare Other | Admitting: Oncology

## 2018-06-29 ENCOUNTER — Inpatient Hospital Stay: Payer: Medicare Other | Attending: Oncology

## 2018-06-29 VITALS — BP 112/77 | HR 83 | Temp 97.4°F | Resp 18 | Ht 70.0 in

## 2018-06-29 DIAGNOSIS — F17210 Nicotine dependence, cigarettes, uncomplicated: Secondary | ICD-10-CM | POA: Diagnosis not present

## 2018-06-29 DIAGNOSIS — D649 Anemia, unspecified: Secondary | ICD-10-CM | POA: Diagnosis not present

## 2018-06-29 DIAGNOSIS — I10 Essential (primary) hypertension: Secondary | ICD-10-CM

## 2018-06-29 DIAGNOSIS — D539 Nutritional anemia, unspecified: Secondary | ICD-10-CM

## 2018-06-29 DIAGNOSIS — D696 Thrombocytopenia, unspecified: Secondary | ICD-10-CM | POA: Insufficient documentation

## 2018-06-29 DIAGNOSIS — D72819 Decreased white blood cell count, unspecified: Secondary | ICD-10-CM

## 2018-06-29 DIAGNOSIS — F319 Bipolar disorder, unspecified: Secondary | ICD-10-CM

## 2018-06-29 LAB — CBC WITH DIFFERENTIAL/PLATELET
BASOS ABS: 0 10*3/uL (ref 0–0.1)
BASOS PCT: 1 %
Eosinophils Absolute: 0.1 10*3/uL (ref 0–0.7)
Eosinophils Relative: 2 %
HEMATOCRIT: 33 % — AB (ref 40.0–52.0)
HEMOGLOBIN: 10.9 g/dL — AB (ref 13.0–18.0)
Lymphocytes Relative: 35 %
Lymphs Abs: 1.3 10*3/uL (ref 1.0–3.6)
MCH: 33.9 pg (ref 26.0–34.0)
MCHC: 33 g/dL (ref 32.0–36.0)
MCV: 102.8 fL — ABNORMAL HIGH (ref 80.0–100.0)
MONOS PCT: 6 %
Monocytes Absolute: 0.2 10*3/uL (ref 0.2–1.0)
Neutro Abs: 2 10*3/uL (ref 1.4–6.5)
Neutrophils Relative %: 56 %
Platelets: 195 10*3/uL (ref 150–440)
RBC: 3.21 MIL/uL — ABNORMAL LOW (ref 4.40–5.90)
RDW: 17 % — ABNORMAL HIGH (ref 11.5–14.5)
WBC: 3.6 10*3/uL — ABNORMAL LOW (ref 3.8–10.6)

## 2018-06-29 LAB — VITAMIN B12: Vitamin B-12: 314 pg/mL (ref 180–914)

## 2018-06-29 NOTE — Progress Notes (Signed)
No new changes noted today 

## 2018-06-30 NOTE — Progress Notes (Signed)
Hematology/Oncology Consult note [facility]  Telephone:([phone] [phone]  Patient Care Team: Remi Haggard, FNP as PCP - General (Family Medicine)   Name of the patient: [name]  [id number]  1956/04/15   Date of visit: 06/30/18  Diagnosis- normocyctic anemia liely due to chronic disease  Mild leukopenia likely benign   Chief complaint/ Reason for visit- routine f/u of anemia and leukopenia  Heme/Onc history: patient is a 62 year old male with a past medical history significant for symptoms of any of, bipolar disorder, history of CVA with right-sided weakness and peripheral vascular disease. He is on Zoloft and Keppra for schizophreniahe has been referred to me for evaluation and management of anemia and leukopenia. Recent CBC from June 2018 showed white count of 2.8, H&H of 12.3/38 and a platelet count of 190. Recent TSH from June 2018 was normal at 1.15.  Patient lives in a group home and is a poor historian. His care givers from group home report no acute issues. He had recurrent foot infections in the past which have now healed. He has also been hospitalized on and off for sepsis/ respiratory infections  Results of bloodwork from 06/09/2017 were as follows: CBC showed white count of 3.4, H&H of 11.5/33.4 and platelet count of 280.Differential on the CBC was normal. CMP showed mild hyponatremia with a sodium of 148. Kidney functions and liver functions were normal. Iron studies B12 and folate were within normal limits. Multiple myeloma panel revealed a mildly increased IgA with apparent polyclonal gammopathy. No monoclonal gammopathy was noted. HIV and hepatitis C testing was negative. Haptoglobin was normal and so H pylori and was negative.   Interval history- he is doing well at his nursing home. He was admitted last month for hypernatremia but other than that he has not had major hralth issues. No recurrent infections  ECOG PS-  3 Pain scale- 0 Opioid associated constipation- no  Review of systems- limited dueto underlying bipolar disorder   Review of Systems  Constitutional: Negative for chills, fever, malaise/fatigue and weight loss.  HENT: Negative for congestion, ear discharge and nosebleeds.   Eyes: Negative for blurred vision.  Respiratory: Negative for cough, hemoptysis, sputum production, shortness of breath and wheezing.   Cardiovascular: Negative for chest pain, palpitations, orthopnea and claudication.  Gastrointestinal: Negative for abdominal pain, blood in stool, constipation, diarrhea, heartburn, melena, nausea and vomiting.  Genitourinary: Negative for dysuria, flank pain, frequency, hematuria and urgency.  Musculoskeletal: Negative for back pain, joint pain and myalgias.  Skin: Negative for rash.  Neurological: Negative for dizziness, tingling, focal weakness, seizures, weakness and headaches.  Endo/Heme/Allergies: Does not bruise/bleed easily.  Psychiatric/Behavioral: Negative for depression and suicidal ideas. The patient does not have insomnia.      No Known Allergies   Past Medical History:  Diagnosis Date  . Anemia   . Anginal pain ([place])   . CHF (congestive heart failure) ([place])   . Collapse of lung 2018  . Decubitus skin ulcer since 2016-17   bilateral heels   . Depression   . Diabetes mellitus without complication ([place])   . Discontinued smoking   . Hypertension   . Metabolic encephalopathy 1829  . Peripheral vascular disease ([place])   . Renal failure (ARF), acute on chronic (HCC)   . Schizophrenia ([place])   . Sepsis ([place]) 2017  . Stroke ([place])   . Thrombocythemia ([place]) 2017  . Thyroid disease      Past Surgical History:  Procedure Laterality  Date  . PERIPHERAL VASCULAR CATHETERIZATION Left 05/26/2016   Procedure: Lower Extremity Angiography;  Surgeon: Algernon Huxley, MD;  Location: [place] [facility];  Service: Cardiovascular;  Laterality: Left;  . PERIPHERAL VASCULAR  CATHETERIZATION  05/26/2016   Procedure: Lower Extremity Intervention;  Surgeon: Algernon Huxley, MD;  Location: [facility] [facility];  Service: Cardiovascular;;  . PERIPHERAL VASCULAR CATHETERIZATION Right 06/05/2016   Procedure: Lower Extremity Angiography;  Surgeon: Algernon Huxley, MD;  Location: [place] [facility];  Service: Cardiovascular;  Laterality: Right;  . periphervascular procedures      Social History   Socioeconomic History  . Marital status: Single    Spouse name: Not on file  . Number of children: Not on file  . Years of education: Not on file  . Highest education level: Not on file  Occupational History  . Not on file  Social Needs  . Financial resource strain: Not on file  . Food insecurity:    Worry: Not on file    Inability: Not on file  . Transportation needs:    Medical: Not on file    Non-medical: Not on file  Tobacco Use  . Smoking status: Current Every Day Smoker    Packs/day: 1.00    Years: 30.00    Pack years: 30.00    Types: Cigarettes  . Smokeless tobacco: Never Used  Substance and Sexual Activity  . Alcohol use: No    Alcohol/week: 0.0 standard drinks    Comment:  pt unable to respond. Hx of alcohol abuse (per pt )  . Drug use: No    Comment: unknown pt UTA  . Sexual activity: Not Currently  Lifestyle  . Physical activity:    Days per week: Not on file    Minutes per session: Not on file  . Stress: Not on file  Relationships  . Social connections:    Talks on phone: Not on file    Gets together: Not on file    Attends religious service: Not on file    Active member of club or organization: Not on file    Attends meetings of clubs or organizations: Not on file    Relationship status: Not on file  . Intimate partner violence:    Fear of current or ex partner: Not on file    Emotionally abused: Not on file    Physically abused: Not on file    Forced sexual activity: Not on file  Other Topics Concern  . Not on file  Social History  Narrative  . Not on file    Family History  Family history unknown: Yes     Current Outpatient Medications:  .  aspirin 81 MG chewable tablet, Chew 1 tablet (81 mg total) by mouth daily., Disp: 30 tablet, Rfl: 0 .  atorvastatin (LIPITOR) 10 MG tablet, Take 1 tablet (10 mg total) by mouth daily., Disp: 30 tablet, Rfl: 5 .  B Complex-C (B-COMPLEX WITH VITAMIN C) tablet, Take 1 tablet by mouth daily., Disp: , Rfl:  .  clopidogrel (PLAVIX) 75 MG tablet, Take 1 tablet (75 mg total) by mouth daily., Disp: 30 tablet, Rfl: 0 .  docusate sodium (COLACE) 100 MG capsule, Take 100 mg by mouth 2 (two) times daily., Disp: , Rfl:  .  ferrous sulfate 325 (65 FE) MG tablet, Take 325 mg by mouth daily with breakfast., Disp: , Rfl:  .  folic acid (FOLVITE) 1 MG tablet, Take 1 mg by mouth daily., Disp: ,  Rfl:  .  levETIRAcetam (KEPPRA) 500 MG tablet, Take 1 tablet (500 mg total) by mouth 2 (two) times daily., Disp: 30 tablet, Rfl: 0 .  midodrine (PROAMATINE) 5 MG tablet, Take 1 tablet (5 mg total) by mouth 2 (two) times daily with a meal., Disp: 30 tablet, Rfl: 0 .  OVER THE COUNTER MEDICATION, [place] fanny cream: Apply as directed as needed for rash, Disp: , Rfl:  .  sacubitril-valsartan (ENTRESTO) 24-26 MG, Take 1 tablet by mouth 2 (two) times daily., Disp: , Rfl:  .  sertraline (ZOLOFT) 50 MG tablet, Take 50 mg by mouth at bedtime., Disp: , Rfl:   Physical exam:  Vitals:   06/29/18 1049  BP: 112/77  Pulse: 83  Resp: 18  Temp: (!) 97.4 F (36.3 C)  TempSrc: Tympanic  SpO2: 97%  Height: _0  (1.778 m)   Physical Exam  Constitutional:  Thin unkempt man sitting ina  Wheelchair. No acute distress  HENT:  Head: Normocephalic and atraumatic.  Eyes: Pupils are equal, round, and reactive to light. EOM are normal.  Neck: Normal range of motion.  Cardiovascular: Normal rate, regular rhythm and normal heart sounds.  Pulmonary/Chest: Effort normal and breath sounds normal.  Abdominal: Soft. Bowel  sounds are normal.  Neurological: He is alert.  Oriented to self  Skin: Skin is warm and dry.     CMP Latest Ref Rng & Units 05/27/2018  Glucose 70 - 99 mg/dL 109(H)  BUN 6 - 20 mg/dL 14  Creatinine 0.61 - 1.24 mg/dL 0.97  Sodium 135 - 145 mmol/L 143  Potassium 3.5 - 5.1 mmol/L 3.4(L)  Chloride 98 - 111 mmol/L 114(H)  CO2 22 - 32 mmol/L 22  Calcium 8.9 - 10.3 mg/dL 8.5(L)  Total Protein 6.5 - 8.1 g/dL 6.0(L)  Total Bilirubin 0.3 - 1.2 mg/dL 0.5  Alkaline Phos 38 - 126 U/L 56  AST 15 - 41 U/L 26  ALT 0 - 44 U/L 29   CBC Latest Ref Rng & Units 06/29/2018  WBC 3.8 - 10.6 K/uL 3.6(L)  Hemoglobin 13.0 - 18.0 g/dL 10.9(L)  Hematocrit 40.0 - 52.0 % 33.0(L)  Platelets 150 - 440 K/uL 195    Assessment and plan- Patient is a 62 y.o. male with normocytic anemia and leukopenia  1. Normocytic anemia: hb over the last 1 year has been ~10. Anemia work up is otheriwse unremarkable. He does have macrocytosis and his meds are not contributing towards it. ? MDS. No indication for bone marrow biopsy at this time unless his anemia worsens  2. Leukopenia: mild between 3-3.5 with a normal differential. Continue to monitor. Plan as above  I will see him back in 6 months with cbc with diff   Visit Diagnosis 1. Leukopenia, unspecified type   2. Thrombocytopenia ([place])      Dr. Randa Evens, MD, MPH [facility] at [facility] [phone] 06/30/2018 10:55 AM

## 2018-08-23 ENCOUNTER — Ambulatory Visit: Payer: Medicare Other | Admitting: Podiatry

## 2018-08-30 ENCOUNTER — Ambulatory Visit: Payer: Medicare Other | Admitting: Podiatry

## 2018-08-31 ENCOUNTER — Inpatient Hospital Stay: Payer: Medicare Other

## 2018-08-31 ENCOUNTER — Emergency Department: Payer: Medicare Other

## 2018-08-31 ENCOUNTER — Inpatient Hospital Stay
Admission: EM | Admit: 2018-08-31 | Discharge: 2018-09-10 | DRG: 871 | Disposition: E | Payer: Medicare Other | Source: Ambulatory Visit | Attending: Internal Medicine | Admitting: Internal Medicine

## 2018-08-31 ENCOUNTER — Other Ambulatory Visit: Payer: Self-pay

## 2018-08-31 DIAGNOSIS — I420 Dilated cardiomyopathy: Secondary | ICD-10-CM | POA: Diagnosis present

## 2018-08-31 DIAGNOSIS — R6521 Severe sepsis with septic shock: Secondary | ICD-10-CM | POA: Diagnosis present

## 2018-08-31 DIAGNOSIS — T68XXXA Hypothermia, initial encounter: Secondary | ICD-10-CM

## 2018-08-31 DIAGNOSIS — Z66 Do not resuscitate: Secondary | ICD-10-CM | POA: Diagnosis present

## 2018-08-31 DIAGNOSIS — A419 Sepsis, unspecified organism: Secondary | ICD-10-CM | POA: Diagnosis present

## 2018-08-31 DIAGNOSIS — F17210 Nicotine dependence, cigarettes, uncomplicated: Secondary | ICD-10-CM | POA: Diagnosis present

## 2018-08-31 DIAGNOSIS — E869 Volume depletion, unspecified: Secondary | ICD-10-CM | POA: Diagnosis present

## 2018-08-31 DIAGNOSIS — K7589 Other specified inflammatory liver diseases: Secondary | ICD-10-CM | POA: Diagnosis present

## 2018-08-31 DIAGNOSIS — D62 Acute posthemorrhagic anemia: Secondary | ICD-10-CM | POA: Diagnosis present

## 2018-08-31 DIAGNOSIS — Z95810 Presence of automatic (implantable) cardiac defibrillator: Secondary | ICD-10-CM

## 2018-08-31 DIAGNOSIS — E1122 Type 2 diabetes mellitus with diabetic chronic kidney disease: Secondary | ICD-10-CM | POA: Diagnosis present

## 2018-08-31 DIAGNOSIS — Z7982 Long term (current) use of aspirin: Secondary | ICD-10-CM

## 2018-08-31 DIAGNOSIS — D649 Anemia, unspecified: Secondary | ICD-10-CM | POA: Diagnosis present

## 2018-08-31 DIAGNOSIS — I130 Hypertensive heart and chronic kidney disease with heart failure and stage 1 through stage 4 chronic kidney disease, or unspecified chronic kidney disease: Secondary | ICD-10-CM | POA: Diagnosis present

## 2018-08-31 DIAGNOSIS — R0902 Hypoxemia: Secondary | ICD-10-CM

## 2018-08-31 DIAGNOSIS — J189 Pneumonia, unspecified organism: Secondary | ICD-10-CM | POA: Diagnosis present

## 2018-08-31 DIAGNOSIS — J95811 Postprocedural pneumothorax: Secondary | ICD-10-CM | POA: Diagnosis not present

## 2018-08-31 DIAGNOSIS — I5022 Chronic systolic (congestive) heart failure: Secondary | ICD-10-CM | POA: Diagnosis not present

## 2018-08-31 DIAGNOSIS — R531 Weakness: Secondary | ICD-10-CM

## 2018-08-31 DIAGNOSIS — E870 Hyperosmolality and hypernatremia: Secondary | ICD-10-CM | POA: Diagnosis present

## 2018-08-31 DIAGNOSIS — I5023 Acute on chronic systolic (congestive) heart failure: Secondary | ICD-10-CM | POA: Diagnosis present

## 2018-08-31 DIAGNOSIS — J9601 Acute respiratory failure with hypoxia: Secondary | ICD-10-CM | POA: Diagnosis present

## 2018-08-31 DIAGNOSIS — Z0189 Encounter for other specified special examinations: Secondary | ICD-10-CM

## 2018-08-31 DIAGNOSIS — Z978 Presence of other specified devices: Secondary | ICD-10-CM

## 2018-08-31 DIAGNOSIS — G9341 Metabolic encephalopathy: Secondary | ICD-10-CM | POA: Diagnosis present

## 2018-08-31 DIAGNOSIS — D696 Thrombocytopenia, unspecified: Secondary | ICD-10-CM | POA: Diagnosis present

## 2018-08-31 DIAGNOSIS — Z515 Encounter for palliative care: Secondary | ICD-10-CM

## 2018-08-31 DIAGNOSIS — D689 Coagulation defect, unspecified: Secondary | ICD-10-CM | POA: Diagnosis present

## 2018-08-31 DIAGNOSIS — R4182 Altered mental status, unspecified: Secondary | ICD-10-CM | POA: Diagnosis not present

## 2018-08-31 DIAGNOSIS — T85528A Displacement of other gastrointestinal prosthetic devices, implants and grafts, initial encounter: Secondary | ICD-10-CM | POA: Diagnosis not present

## 2018-08-31 DIAGNOSIS — Y92239 Unspecified place in hospital as the place of occurrence of the external cause: Secondary | ICD-10-CM | POA: Diagnosis not present

## 2018-08-31 DIAGNOSIS — K922 Gastrointestinal hemorrhage, unspecified: Secondary | ICD-10-CM | POA: Diagnosis present

## 2018-08-31 DIAGNOSIS — Z8673 Personal history of transient ischemic attack (TIA), and cerebral infarction without residual deficits: Secondary | ICD-10-CM

## 2018-08-31 DIAGNOSIS — Z7189 Other specified counseling: Secondary | ICD-10-CM | POA: Diagnosis not present

## 2018-08-31 DIAGNOSIS — N390 Urinary tract infection, site not specified: Secondary | ICD-10-CM | POA: Diagnosis present

## 2018-08-31 DIAGNOSIS — I340 Nonrheumatic mitral (valve) insufficiency: Secondary | ICD-10-CM | POA: Diagnosis not present

## 2018-08-31 DIAGNOSIS — I509 Heart failure, unspecified: Secondary | ICD-10-CM

## 2018-08-31 DIAGNOSIS — I5030 Unspecified diastolic (congestive) heart failure: Secondary | ICD-10-CM | POA: Diagnosis not present

## 2018-08-31 DIAGNOSIS — E1151 Type 2 diabetes mellitus with diabetic peripheral angiopathy without gangrene: Secondary | ICD-10-CM | POA: Diagnosis present

## 2018-08-31 DIAGNOSIS — J810 Acute pulmonary edema: Secondary | ICD-10-CM

## 2018-08-31 DIAGNOSIS — F209 Schizophrenia, unspecified: Secondary | ICD-10-CM | POA: Diagnosis present

## 2018-08-31 DIAGNOSIS — N170 Acute kidney failure with tubular necrosis: Secondary | ICD-10-CM | POA: Diagnosis present

## 2018-08-31 DIAGNOSIS — Z7902 Long term (current) use of antithrombotics/antiplatelets: Secondary | ICD-10-CM

## 2018-08-31 DIAGNOSIS — Y848 Other medical procedures as the cause of abnormal reaction of the patient, or of later complication, without mention of misadventure at the time of the procedure: Secondary | ICD-10-CM | POA: Diagnosis not present

## 2018-08-31 DIAGNOSIS — I351 Nonrheumatic aortic (valve) insufficiency: Secondary | ICD-10-CM | POA: Diagnosis not present

## 2018-08-31 DIAGNOSIS — Z6821 Body mass index (BMI) 21.0-21.9, adult: Secondary | ICD-10-CM

## 2018-08-31 DIAGNOSIS — R680 Hypothermia, not associated with low environmental temperature: Secondary | ICD-10-CM | POA: Diagnosis present

## 2018-08-31 DIAGNOSIS — N183 Chronic kidney disease, stage 3 (moderate): Secondary | ICD-10-CM | POA: Diagnosis present

## 2018-08-31 DIAGNOSIS — J939 Pneumothorax, unspecified: Secondary | ICD-10-CM

## 2018-08-31 LAB — CBC WITH DIFFERENTIAL/PLATELET
Abs Immature Granulocytes: 0.05 10*3/uL (ref 0.00–0.07)
BASOS ABS: 0 10*3/uL (ref 0.0–0.1)
BASOS PCT: 0 %
EOS ABS: 0.1 10*3/uL (ref 0.0–0.5)
Eosinophils Relative: 2 %
HEMATOCRIT: 21.2 % — AB (ref 39.0–52.0)
Hemoglobin: 6.8 g/dL — ABNORMAL LOW (ref 13.0–17.0)
Immature Granulocytes: 1 %
LYMPHS ABS: 1.1 10*3/uL (ref 0.7–4.0)
Lymphocytes Relative: 28 %
MCH: 36.2 pg — AB (ref 26.0–34.0)
MCHC: 32.1 g/dL (ref 30.0–36.0)
MCV: 112.8 fL — AB (ref 80.0–100.0)
Monocytes Absolute: 0.3 10*3/uL (ref 0.1–1.0)
Monocytes Relative: 7 %
NEUTROS PCT: 62 %
NRBC: 42.2 % — AB (ref 0.0–0.2)
Neutro Abs: 2.4 10*3/uL (ref 1.7–7.7)
Platelets: 77 10*3/uL — ABNORMAL LOW (ref 150–400)
RBC: 1.88 MIL/uL — AB (ref 4.22–5.81)
RDW: 20.8 % — AB (ref 11.5–15.5)
Smear Review: NORMAL
WBC: 3.9 10*3/uL — AB (ref 4.0–10.5)

## 2018-08-31 LAB — BASIC METABOLIC PANEL
ANION GAP: 9 (ref 5–15)
BUN: 74 mg/dL — ABNORMAL HIGH (ref 6–20)
CALCIUM: 8.1 mg/dL — AB (ref 8.9–10.3)
CHLORIDE: 123 mmol/L — AB (ref 98–111)
CO2: 19 mmol/L — ABNORMAL LOW (ref 22–32)
Creatinine, Ser: 2 mg/dL — ABNORMAL HIGH (ref 0.61–1.24)
GFR calc Af Amer: 40 mL/min — ABNORMAL LOW (ref >60)
GFR, EST NON AFRICAN AMERICAN: 35 mL/min — AB (ref >60)
GLUCOSE: 98 mg/dL (ref 70–99)
POTASSIUM: 4.2 mmol/L (ref 3.5–5.1)
Sodium: 151 mmol/L — ABNORMAL HIGH (ref 135–145)

## 2018-08-31 LAB — BLOOD GAS, ARTERIAL
Acid-base deficit: 5.8 mmol/L — ABNORMAL HIGH (ref 0.0–2.0)
BICARBONATE: 18.5 mmol/L — AB (ref 20.0–28.0)
FIO2: 0.28
O2 SAT: 98.6 %
PATIENT TEMPERATURE: 37
PO2 ART: 122 mmHg — AB (ref 83.0–108.0)
pCO2 arterial: 32 mmHg (ref 32.0–48.0)
pH, Arterial: 7.37 (ref 7.350–7.450)

## 2018-08-31 LAB — URINALYSIS, ROUTINE W REFLEX MICROSCOPIC
BILIRUBIN URINE: NEGATIVE
Bacteria, UA: NONE SEEN
Glucose, UA: NEGATIVE mg/dL
Ketones, ur: NEGATIVE mg/dL
Nitrite: NEGATIVE
PROTEIN: 30 mg/dL — AB
SQUAMOUS EPITHELIAL / LPF: NONE SEEN (ref 0–5)
Specific Gravity, Urine: 1.012 (ref 1.005–1.030)
pH: 5 (ref 5.0–8.0)

## 2018-08-31 LAB — URINALYSIS, COMPLETE (UACMP) WITH MICROSCOPIC
BACTERIA UA: NONE SEEN
Bilirubin Urine: NEGATIVE
Glucose, UA: NEGATIVE mg/dL
HGB URINE DIPSTICK: NEGATIVE
Ketones, ur: NEGATIVE mg/dL
Leukocytes, UA: NEGATIVE
Nitrite: NEGATIVE
PH: 5 (ref 5.0–8.0)
Protein, ur: NEGATIVE mg/dL
SPECIFIC GRAVITY, URINE: 1.012 (ref 1.005–1.030)
Squamous Epithelial / LPF: NONE SEEN (ref 0–5)

## 2018-08-31 LAB — MRSA PCR SCREENING: MRSA by PCR: NEGATIVE

## 2018-08-31 LAB — COMPREHENSIVE METABOLIC PANEL
ALK PHOS: 254 U/L — AB (ref 38–126)
ALT: 72 U/L — AB (ref 0–44)
ANION GAP: 12 (ref 5–15)
AST: 55 U/L — ABNORMAL HIGH (ref 15–41)
Albumin: 3.9 g/dL (ref 3.5–5.0)
BILIRUBIN TOTAL: 2.9 mg/dL — AB (ref 0.3–1.2)
BUN: 73 mg/dL — ABNORMAL HIGH (ref 6–20)
CO2: 19 mmol/L — ABNORMAL LOW (ref 22–32)
Calcium: 9.1 mg/dL (ref 8.9–10.3)
Chloride: 118 mmol/L — ABNORMAL HIGH (ref 98–111)
Creatinine, Ser: 1.86 mg/dL — ABNORMAL HIGH (ref 0.61–1.24)
GFR, EST AFRICAN AMERICAN: 44 mL/min — AB (ref >60)
GFR, EST NON AFRICAN AMERICAN: 38 mL/min — AB (ref >60)
Glucose, Bld: 104 mg/dL — ABNORMAL HIGH (ref 70–99)
Potassium: 4.3 mmol/L (ref 3.5–5.1)
Sodium: 149 mmol/L — ABNORMAL HIGH (ref 135–145)
TOTAL PROTEIN: 6.8 g/dL (ref 6.5–8.1)

## 2018-08-31 LAB — SAMPLE TO BLOOD BANK

## 2018-08-31 LAB — LACTIC ACID, PLASMA
LACTIC ACID, VENOUS: 2.2 mmol/L — AB (ref 0.5–1.9)
LACTIC ACID, VENOUS: 1.4 mmol/L (ref 0.5–1.9)
Lactic Acid, Venous: 1.5 mmol/L (ref 0.5–1.9)
Lactic Acid, Venous: 1.7 mmol/L (ref 0.5–1.9)

## 2018-08-31 LAB — PATHOLOGIST SMEAR REVIEW

## 2018-08-31 LAB — BRAIN NATRIURETIC PEPTIDE

## 2018-08-31 LAB — GLUCOSE, CAPILLARY: Glucose-Capillary: 114 mg/dL — ABNORMAL HIGH (ref 70–99)

## 2018-08-31 LAB — PROTIME-INR
INR: 1.92
Prothrombin Time: 21.7 seconds — ABNORMAL HIGH (ref 11.4–15.2)

## 2018-08-31 LAB — PHOSPHORUS: Phosphorus: 6.4 mg/dL — ABNORMAL HIGH (ref 2.5–4.6)

## 2018-08-31 LAB — HEMOGLOBIN AND HEMATOCRIT, BLOOD
HEMATOCRIT: 20.3 % — AB (ref 39.0–52.0)
Hemoglobin: 6.6 g/dL — ABNORMAL LOW (ref 13.0–17.0)

## 2018-08-31 LAB — ABO/RH: ABO/RH(D): A POS

## 2018-08-31 LAB — PROCALCITONIN: PROCALCITONIN: 0.41 ng/mL

## 2018-08-31 LAB — MAGNESIUM: MAGNESIUM: 2.5 mg/dL — AB (ref 1.7–2.4)

## 2018-08-31 LAB — APTT: aPTT: 44 seconds — ABNORMAL HIGH (ref 24–36)

## 2018-08-31 LAB — TROPONIN I: Troponin I: 0.06 ng/mL (ref <0.03)

## 2018-08-31 MED ORDER — PANTOPRAZOLE SODIUM 40 MG IV SOLR: 40.0000 mg | Freq: Two times a day (BID) | INTRAVENOUS | Status: DC

## 2018-08-31 MED ORDER — NOREPINEPHRINE 16 MG/250ML-% IV SOLN
0.0000 ug/min | INTRAVENOUS | Status: DC
  Administered 2018-08-31: 4 ug/min via INTRAVENOUS
  Administered 2018-09-03 – 2018-09-04 (×2): 20 ug/min via INTRAVENOUS
  Filled 2018-08-31: qty 16
  Filled 2018-08-31 (×6): qty 250

## 2018-08-31 MED ORDER — SODIUM CHLORIDE 0.9 % IV BOLUS
500.0000 mL | Freq: Once | INTRAVENOUS | Status: AC
  Administered 2018-08-31: 500 mL via INTRAVENOUS

## 2018-08-31 MED ORDER — SODIUM CHLORIDE 0.9 % IV SOLN
8.0000 mg/h | INTRAVENOUS | Status: DC
  Administered 2018-08-31 – 2018-09-03 (×6): 8 mg/h via INTRAVENOUS
  Filled 2018-08-31 (×7): qty 80

## 2018-08-31 MED ORDER — ORAL CARE MOUTH RINSE
15.0000 mL | Freq: Two times a day (BID) | OROMUCOSAL | Status: DC
  Administered 2018-08-31 – 2018-09-03 (×6): 15 mL via OROMUCOSAL

## 2018-08-31 MED ORDER — SODIUM CHLORIDE 0.9% FLUSH: 10.0000 mL | INTRAVENOUS | Status: DC | PRN

## 2018-08-31 MED ORDER — SODIUM CHLORIDE 0.9% IV SOLUTION
Freq: Once | INTRAVENOUS | Status: AC
  Administered 2018-08-31: 21:00:00 via INTRAVENOUS

## 2018-08-31 MED ORDER — SODIUM CHLORIDE 0.9 % IV SOLN
2.0000 g | Freq: Once | INTRAVENOUS | Status: AC
  Administered 2018-08-31: 2 g via INTRAVENOUS
  Filled 2018-08-31: qty 2

## 2018-08-31 MED ORDER — SODIUM CHLORIDE 0.9% FLUSH
3.0000 mL | Freq: Two times a day (BID) | INTRAVENOUS | Status: DC
  Administered 2018-08-31 – 2018-09-03 (×7): 3 mL via INTRAVENOUS

## 2018-08-31 MED ORDER — VANCOMYCIN HCL IN DEXTROSE 1-5 GM/200ML-% IV SOLN
1000.0000 mg | Freq: Once | INTRAVENOUS | Status: AC
  Administered 2018-08-31: 1000 mg via INTRAVENOUS
  Filled 2018-08-31: qty 200

## 2018-08-31 MED ORDER — ONDANSETRON HCL 4 MG/2ML IJ SOLN: 4.0000 mg | Freq: Four times a day (QID) | INTRAMUSCULAR | Status: DC | PRN

## 2018-08-31 MED ORDER — ACETAMINOPHEN 650 MG RE SUPP: 650.0000 mg | Freq: Four times a day (QID) | RECTAL | Status: DC | PRN

## 2018-08-31 MED ORDER — ACETAMINOPHEN 325 MG PO TABS: 650.0000 mg | ORAL_TABLET | Freq: Four times a day (QID) | ORAL | Status: DC | PRN

## 2018-08-31 MED ORDER — POLYETHYLENE GLYCOL 3350 17 G PO PACK: 17.0000 g | PACK | Freq: Every day | ORAL | Status: DC | PRN

## 2018-08-31 MED ORDER — STERILE WATER FOR INJECTION IV SOLN
INTRAVENOUS | Status: DC
  Administered 2018-08-31: 21:00:00 via INTRAVENOUS
  Filled 2018-08-31 (×3): qty 850

## 2018-08-31 MED ORDER — ONDANSETRON HCL 4 MG PO TABS: 4.0000 mg | ORAL_TABLET | Freq: Four times a day (QID) | ORAL | Status: DC | PRN

## 2018-08-31 MED ORDER — SODIUM CHLORIDE 0.9 % IV SOLN
2.0000 g | Freq: Two times a day (BID) | INTRAVENOUS | Status: DC
  Administered 2018-09-01 – 2018-09-03 (×5): 2 g via INTRAVENOUS
  Filled 2018-08-31 (×7): qty 2

## 2018-08-31 MED ORDER — SODIUM CHLORIDE 0.9 % IV BOLUS
1000.0000 mL | Freq: Once | INTRAVENOUS | Status: AC
  Administered 2018-08-31: 1000 mL via INTRAVENOUS

## 2018-08-31 MED ORDER — SODIUM BICARBONATE 8.4 % IV SOLN
50.0000 meq | Freq: Once | INTRAVENOUS | Status: AC
  Administered 2018-08-31: 50 meq via INTRAVENOUS
  Filled 2018-08-31: qty 50

## 2018-08-31 MED ORDER — SODIUM CHLORIDE 0.9% FLUSH
10.0000 mL | Freq: Two times a day (BID) | INTRAVENOUS | Status: DC
  Administered 2018-08-31 – 2018-09-02 (×5): 10 mL
  Administered 2018-09-03: 40 mL

## 2018-08-31 MED ORDER — METRONIDAZOLE IN NACL 5-0.79 MG/ML-% IV SOLN
500.0000 mg | Freq: Three times a day (TID) | INTRAVENOUS | Status: DC
  Administered 2018-08-31 – 2018-09-03 (×9): 500 mg via INTRAVENOUS
  Filled 2018-08-31 (×10): qty 100

## 2018-08-31 MED ORDER — ALBUTEROL SULFATE (2.5 MG/3ML) 0.083% IN NEBU: 2.5000 mg | INHALATION_SOLUTION | RESPIRATORY_TRACT | Status: DC | PRN

## 2018-08-31 MED ORDER — SODIUM CHLORIDE 0.9 % IV SOLN
80.0000 mg | Freq: Once | INTRAVENOUS | Status: AC
  Administered 2018-08-31: 80 mg via INTRAVENOUS
  Filled 2018-08-31: qty 80

## 2018-08-31 MED ORDER — SODIUM CHLORIDE 0.9 % IV SOLN
10.0000 mL/h | Freq: Once | INTRAVENOUS | Status: AC
  Administered 2018-08-31: 10 mL/h via INTRAVENOUS

## 2018-08-31 MED ORDER — VANCOMYCIN HCL IN DEXTROSE 1-5 GM/200ML-% IV SOLN
1000.0000 mg | INTRAVENOUS | Status: DC
  Administered 2018-09-01: 1000 mg via INTRAVENOUS
  Filled 2018-08-31 (×2): qty 200

## 2018-08-31 NOTE — Progress Notes (Signed)
CODE SEPSIS - PHARMACY COMMUNICATION  **Broad Spectrum Antibiotics should be administered within 1 hour of Sepsis diagnosis**  Time Code Sepsis Called/Page Received: 1435  Antibiotics Ordered: Cefepime, metronidazole, vancomycin  Time of 1st antibiotic administration: 1447  Additional action taken by pharmacy: n/a  If necessary, Name of Provider/Nurse Contacted: [place] ,PharmD Clinical Pharmacist  08/28/2018  2:36 PM

## 2018-08-31 NOTE — ED Notes (Signed)
When asking pt name and birthday, it seems like pt atemopts to respond but only able to [facility] and no clear speech can be understood at this time. Pt contracted on arrival with knees bent up to chest. When attempting to straighten legs pt unable to completely straighten legs.

## 2018-08-31 NOTE — Consult Note (Signed)
Name: [name] MRN: [id number] DOB: [birth year]     CONSULTATION DATE: 09/05/2018     HISTORY OF PRESENT ILLNESS:  72 y old man with h/o chronic systolic CHF with ejection fraction 15%, diabetes mellitus, CKD 3, schizophrenia, recurrent admissions for dehydration and hyponatremia. Patient presented to ED form group residence because of lethargy. He was found to be hypotensive, acidosis, anemic with HB < 7 gm/dl. While in ED he received 1 RBC, had Rt. IJ LC placed and didn't received volume resuscitation because of concern for CHF. All history was obtained from Dr. Darvin Neighbours, nursing and EMR. Patient arrived to ICU unresponsive, SBP 70s, on [place]. Code status DNR however family are OK with presser as verified with Dr. Darvin Neighbours  PAST MEDICAL HISTORY :   has a past medical history of Anemia, Anginal pain ([place]), CHF (congestive heart failure) ([place]), Collapse of lung (2018), Decubitus skin ulcer (since 2016-17), Depression, Diabetes mellitus without complication ([place]), Discontinued smoking, Hypertension, Metabolic encephalopathy (2409), Peripheral vascular disease ([place]), Renal failure (ARF), acute on chronic ([place]), Schizophrenia ([place]), Sepsis ([place]) (2017), Stroke ([place]), Thrombocythemia ([place]) (2017), and Thyroid disease.  has a past surgical history that includes Cardiac catheterization (Left, 05/26/2016); Cardiac catheterization (05/26/2016); periphervascular procedures; and Cardiac catheterization (Right, 06/05/2016). Prior to Admission medications   Medication Sig Start Date End Date Taking? Authorizing Provider  aspirin 81 MG chewable tablet Chew 1 tablet (81 mg total) by mouth daily. 03/10/18  Yes Dhungel, Nishant, MD  atorvastatin (LIPITOR) 10 MG tablet Take 1 tablet (10 mg total) by mouth daily. 05/26/16  Yes Dew, Erskine Squibb, MD  B Complex-C (B-COMPLEX WITH VITAMIN C) tablet Take 1 tablet by mouth daily.   Yes [provider]  clopidogrel (PLAVIX) 75 MG tablet Take 1 tablet (75 mg total) by  mouth daily. 03/09/18 03/09/19 Yes Dhungel, Nishant, MD  docusate sodium (COLACE) 100 MG capsule Take 100 mg by mouth 2 (two) times daily.   Yes [provider]  ferrous sulfate 325 (65 FE) MG tablet Take 325 mg by mouth daily with breakfast.   Yes [provider]  folic acid (FOLVITE) 1 MG tablet Take 1 mg by mouth daily.   Yes [provider]  [place] (DERMACLOUD) CREA Apply topically as needed (for pressure ulcers).   Yes [provider]  levETIRAcetam (KEPPRA) 500 MG tablet Take 1 tablet (500 mg total) by mouth 2 (two) times daily. 03/09/18  Yes Dhungel, Nishant, MD  midodrine (PROAMATINE) 5 MG tablet Take 1 tablet (5 mg total) by mouth 2 (two) times daily with a meal. 03/09/18  Yes Dhungel, Nishant, MD  sacubitril-valsartan (ENTRESTO) 24-26 MG Take 1 tablet by mouth 2 (two) times daily.   Yes [provider]  sertraline (ZOLOFT) 50 MG tablet Take 50 mg by mouth at bedtime.   Yes [provider]   No Known Allergies  FAMILY HISTORY:  Family history is unknown by patient. SOCIAL HISTORY:  reports that he has been smoking cigarettes. He has a 30.00 pack-year smoking history. He has never used smokeless tobacco. He reports that he does not drink alcohol or use drugs.  REVIEW OF SYSTEMS:   Unable to obtain due to critical illness   VITAL SIGNS: Temp:  [89.8 F (32.1 C)-95.6 F (35.3 C)] 95.6 F (35.3 C) (10/22 1730) Pulse Rate:  [48-96] 67 (10/22 1730) Resp:  [0-35] 28 (10/22 1730) BP: (72-125)/(52-112) 89/68 (10/22 1730) SpO2:  [93 %-100 %] 96 % (10/22 1730) Weight:  [63.5 kg] 63.5  kg (10/22 1209)  Physical Examination:  Unresponsive with transverse nystagmus On [place], no distress, AE on Lt. < Rt. And no rales S1 & S2 audible and no murmur Benign abdomen with feeble peristalses Wasted extremities and no edema   ASSESSMENT / PLAN:  Acute respiratory failure. Tolerating [place]. DNR -Monitor ABG and optimize O2  therapy  Altered mental status with unresponsiveness due to toxic metabolic encephalopathy. Base line schizophrenia and encephalopathy. CT head 04/2018 Ole Lt. Post parietal and watershed and old Lt. Central lacunar infarct. -Monitor neuro status -CT head when hemodynamically stable  Septic shock / cardiogenic etiology with acute on chronic HFrEF. h/o dilated cardiomyopathy and EF of 15% on ECHO 02/2018 -Optimize volume, pressers and monitor hemodynamics. -Empiric Vanc + Cefe -Monitor Procalcitonin and cultures.  Pneumonia and atelectasis. Lt. L airspace disease -Cefep + Vanc + Flagyl -Monitor CXR + CBC + FIO2  Metabolic acidosis with lactic acidemia -Optimize Bicarb based solution -Monitor renal panel, PH and LA  AKI and hypernatremia with ATN, intravascular volume depletion and hypoperfusion -Optimize hemodynamics, avoid nephrotoxins, monitor renal panel and urine out put. -Renal U/S to r/o obstructive uropathy if no improvement  PAD on ASA + Plavix. No acute ischemia -Keep antiplat on hold  Elevated LFT with possible acute ischemic hepatitis -Optimize hemodynamics. Avoid hepatotoxins and monitor LFTs. -Follow with GI  Anemia requiring transfusion with no obvious bleeding.  -PPI -Monitor HB and keep > 8 gm/dl -GI is following and advised hemodynamic stability and possible endoscopy in am  Thrombocytopenia -Monitor Plat and keep > 50000.  DNR  DVT & GI prophylaxis. Continue with supportive care  Critical care time 45 min

## 2018-08-31 NOTE — ED Notes (Addendum)
Blood products completed infusing prior to transfer to ICU

## 2018-08-31 NOTE — ED Notes (Signed)
Blood administration rate increased to 110mL per hour

## 2018-08-31 NOTE — ED Notes (Signed)
bair hugger placed on pt due to low temperature.

## 2018-08-31 NOTE — ED Notes (Signed)
Since placing foley cath pt has only had total of 38 mL urine output which was recorded at this time.

## 2018-08-31 NOTE — Consult Note (Signed)
Pharmacy Antibiotic Note  [name] is a 61 y.o. male admitted on 08/19/2018 with sepsis.  Pharmacy has been consulted for cefepime and vancomycin dosing.  Plan: Vancomycin 1000 IV every 24 hours.  Goal trough 15-20 mcg/mL. Cefepime 2 gm IV every 12 hours  Height: 5\' 8"  (172.7 cm) Weight: 140 lb (63.5 kg) IBW/kg (Calculated) : 68.4  Temp (24hrs), Avg:91.6 F (33.1 C), Min:89.8 F (32.1 C), Max:93 F (33.9 C)  Recent Labs  Lab 08/27/2018 1242 09/08/2018 1244  WBC 3.9*  --   CREATININE 1.86*  --   LATICACIDVEN  --  2.2*    Estimated Creatinine Clearance: 37.9 mL/min (A) (by C-G formula based on SCr of 1.86 mg/dL (H)).    No Known Allergies  Antimicrobials this admission: Cefepime 10/22 >>  Metronidazole 10/22 >>  Vancomycin 10/22  Dose adjustments this admission:   Microbiology results: 10/22 BCx: pending 10/22 UCx: pending   Sputum:    MRSA PCR:   Thank you for allowing pharmacy to be a part of this patient's care.  Forrest Moron, PharmD 08/25/2018 4:06 PM

## 2018-08-31 NOTE — ED Notes (Addendum)
When attempting to answer name pt voice was gargled. Pt attempting to clear throat but unable to do so without assistance. RN attached suction and used yankar to suction secretions from back of pt mouth. 76mL of thin yellow fluids suctioned from pt mouth. No redness noted at the iv site at this time.

## 2018-08-31 NOTE — ED Notes (Signed)
Date and time results received: 09/03/2018  Test: troponin Critical Value: 0.06  Name of Provider [place]  Orders Received? Or Actions Taken?: MD notified

## 2018-08-31 NOTE — ED Triage Notes (Addendum)
Pt arrived via ems from doctor's office. Pt is a resident at a group home in [place]. Staff stated that pt was more lethargic and swollen than usual and they were not able to get any vitals on the pt. Pt is normally non verbal and is lethargic upon arrival. EMS vitals 128/97, 60, and 95% on RA. Pt is a resident of [facility] in [place], [place].

## 2018-08-31 NOTE — ED Provider Notes (Addendum)
[facility] Emergency Department Provider Note  Time seen: 12:34 PM  I have reviewed the triage vital signs and the nursing notes.   HISTORY  Chief Complaint Fatigue    HPI [name] is a 63 y.o. male with a past medical history of anemia, CHF, diabetes, hypertension, schizophrenia, renal insufficiency, presents to the emergency department from his group facility for somnolence and significant swelling.  According EMS report patient lives at a group home, they noted today the patient looked more swollen than normal and was acting much more somnolent.  They were able to get the patient to his primary care physician who then referred the patient to ER via EMS.  Here the patient is very somnolent, difficult to stay awake is not able to answer questions contribute to his history or answer review of systems questioning's.   Past Medical History:  Diagnosis Date  . Anemia   . Anginal pain ([place])   . CHF (congestive heart failure) ([place])   . Collapse of lung 2018  . Decubitus skin ulcer since 2016-17   bilateral heels   . Depression   . Diabetes mellitus without complication ([place])   . Discontinued smoking   . Hypertension   . Metabolic encephalopathy 9470  . Peripheral vascular disease ([place])   . Renal failure (ARF), acute on chronic (HCC)   . Schizophrenia ([place])   . Sepsis ([place]) 2017  . Stroke ([place])   . Thrombocythemia ([place]) 2017  . Thyroid disease     Patient Active Problem List   Diagnosis Date Noted  . FTT (failure to thrive) in adult 05/26/2018  . CHF (congestive heart failure) ([place])   . Dehydration, severe 05/25/2018  . Leukocytopenia 05/25/2018  . Bradycardia 05/25/2018  . Pressure injury of skin 05/08/2018  . ARF (acute renal failure) ([place]) 05/05/2018  . Hypotension 03/09/2018  . Pancytopenia ([place]) 03/06/2018  . Troponin level elevated 03/06/2018  . Schizophrenia ([place]) 03/06/2018  . History of stroke 03/06/2018  . PVD (peripheral vascular  disease) ([place]) 03/06/2018  . Type 2 diabetes mellitus without complication ([place]) 96/28/3662  . Protein-calorie malnutrition, severe 01/16/2017  . Acute hypernatremia 01/14/2017  . Collapse of lung   . Mucus plugging of bronchi   . Acute on chronic respiratory failure ([place])   . Pressure ulcer 02/04/2016  . Right lower lobe pneumonia ([place]) 02/01/2016  . Septic shock ([place]) 02/01/2016  . Hypernatremia 02/01/2016  . Acute renal failure (ARF) ([place]) 02/01/2016  . Lactic acidosis 02/01/2016  . Metabolic encephalopathy 94/76/5465  . Thrombocytopenia ([place]) 02/01/2016  . Anemia 02/01/2016  . Sepsis ([place]) 02/01/2016    Past Surgical History:  Procedure Laterality Date  . PERIPHERAL VASCULAR CATHETERIZATION Left 05/26/2016   Procedure: Lower Extremity Angiography;  Surgeon: Algernon Huxley, MD;  Location: [facility];  Service: Cardiovascular;  Laterality: Left;  . PERIPHERAL VASCULAR CATHETERIZATION  05/26/2016   Procedure: Lower Extremity Intervention;  Surgeon: Algernon Huxley, MD;  Location: [place] [facility];  Service: Cardiovascular;;  . PERIPHERAL VASCULAR CATHETERIZATION Right 06/05/2016   Procedure: Lower Extremity Angiography;  Surgeon: Algernon Huxley, MD;  Location: [facility];  Service: Cardiovascular;  Laterality: Right;  . periphervascular procedures      Prior to Admission medications   Medication Sig Start Date End Date Taking? Authorizing Provider  aspirin 81 MG chewable tablet Chew 1 tablet (81 mg total) by mouth daily. 03/10/18   Dhungel, Nishant, MD  atorvastatin (LIPITOR) 10 MG tablet Take 1  tablet (10 mg total) by mouth daily. 05/26/16   Algernon Huxley, MD  B Complex-C (B-COMPLEX WITH VITAMIN C) tablet Take 1 tablet by mouth daily.    [provider]  clopidogrel (PLAVIX) 75 MG tablet Take 1 tablet (75 mg total) by mouth daily. 03/09/18 03/09/19  Dhungel, Flonnie Overman, MD  docusate sodium (COLACE) 100 MG capsule Take 100 mg by mouth 2 (two) times daily.    [provider]  ferrous sulfate 325 (65 FE) MG tablet Take 325 mg by mouth daily with breakfast.    [provider]  folic acid (FOLVITE) 1 MG tablet Take 1 mg by mouth daily.    [provider]  levETIRAcetam (KEPPRA) 500 MG tablet Take 1 tablet (500 mg total) by mouth 2 (two) times daily. 03/09/18   Dhungel, Flonnie Overman, MD  midodrine (PROAMATINE) 5 MG tablet Take 1 tablet (5 mg total) by mouth 2 (two) times daily with a meal. 03/09/18   Dhungel, Flonnie Overman, MD  OVER THE COUNTER MEDICATION [place] fanny cream: Apply as directed as needed for rash    [provider]  sacubitril-valsartan (ENTRESTO) 24-26 MG Take 1 tablet by mouth 2 (two) times daily.    [provider]  sertraline (ZOLOFT) 50 MG tablet Take 50 mg by mouth at bedtime.    [provider]    No Known Allergies  Family History  Family history unknown: Yes    Social History Social History   Tobacco Use  . Smoking status: Current Every Day Smoker    Packs/day: 1.00    Years: 30.00    Pack years: 30.00    Types: Cigarettes  . Smokeless tobacco: Never Used  Substance Use Topics  . Alcohol use: No    Alcohol/week: 0.0 standard drinks    Comment:  pt unable to respond. Hx of alcohol abuse (per pt )  . Drug use: No    Comment: unknown pt UTA    Review of Systems Unable to obtain an adequate/accurate review of systems secondary to altered mental status  ____________________________________________   PHYSICAL EXAM:  VITAL SIGNS: ED Triage Vitals  Enc Vitals Group     BP --      Pulse Rate 09/09/2018 1208 (!) 58     Resp 09/03/2018 1208 19     Temp 08/29/2018 1219 (!) 92.4 F (33.6 C)     Temp Source 08/25/2018 1219 Rectal     SpO2 08/29/2018 1208 95 %     Weight 08/25/2018 1209 140 lb (63.5 kg)     Height 08/23/2018 1209 5\' 8"  (1.727 m)     Head Circumference --      [place] Flow --      Pain Score --      Pain Loc --      Pain Edu? --      Excl. in [place]? --    Constitutional:  Patient is very somnolent/lethargic, slurred speech.  Falls asleep but not engaged. Eyes: Normal exam ENT   Head: Patient has a very swollen appearing face   Mouth/Throat: Mucous membranes are moist. Cardiovascular: Normal rate, regular rhythm Respiratory: Normal respiratory effort without tachypnea nor retractions. Breath sounds are clear  Gastrointestinal: Soft and nontender. No distention.   Musculoskeletal: Nontender with normal range of motion in all extremities. No lower extremity tenderness  Neurologic: Somewhat slowed and slurred speech.  Unable to complete an adequate neurological examination due to patient mental status and effort. Skin:  Skin is warm, dry  and intact.  Psychiatric: Somnolent  ____________________________________________    EKG  EKG reviewed and interpreted by myself shows a junctional rhythm at 57 bpm with a widened QRS, left axis deviation, prolonged QTC nonspecific ST changes.  ____________________________________________    RADIOLOGY  Chest x-ray shows interstitial edema possible infiltrate versus atelectasis.  ____________________________________________   INITIAL IMPRESSION / ASSESSMENT AND PLAN / ED COURSE  Pertinent labs & imaging results that were available during my care of the patient were reviewed by me and considered in my medical decision making (see chart for details).  Patient presents to the emergency department for altered mental status/lethargy/somnolence.  Differential is quite broad at this time but would include infectious etiology, metabolic or electrolyte abnormality, CHF exacerbation, renal failure.  Of note patient has a rectal temperature of 92.4, will place on a bear hugger.  We will check labs including lactic acid, blood cultures, obtain a chest x-ray EKG and continue to closely monitor in the emergency department.  I reviewed the patient's records, he has CHF with a last ejection fraction of 15% in April 2019, this  could explain the significant swelling.  Patient's labs are resulted showing a lactic acid of 2.2, chemistry shows acute renal insufficiency.  Patient does have elevated LFTs, no abdominal pain or tenderness on my exam.  Could be due to shock liver given persistent hypotension.  Troponin is elevated 0.06 could be related to renal insufficiency or cardiac disease.  Patient CBC shows his hemoglobin is currently 6.8 down from 10.9 2 months ago.  Rectal examination shows dark appearing stool however only trace guaiac positive.  We will order 1 unit of emergency release blood given his altered mental status.  Patient remains somewhat hypotensive 76/63 receiving IV fluids although at a lower rate given we will also half to transfuse blood and I do not want to overload the patient given his significantly decreased ejection fraction.  Patient will likely require ICU level of care.  Given his altered mental status we will also obtain CT imaging of the head as a precaution.  Patient remains somnolent but does awaken to voice, will attempt to follow simple commands.  Chest x-ray shows interstitial edema early infiltrate versus atelectasis.  Patient will require IV diuresis however given his current hypotension we will hold off at this time until the patient becomes more stable.  Lactate is elevated 2.2 patient remains hypotensive and hypothermic, given the questionable pneumonia on x-ray we will cover with antibiotics as a precaution.  Discussed with the hospitalist they will be admitting to their service for further treatment.  Patient remains critically ill and possibly septic.  We will continue to push IV fluids as tolerated, antibiotics and blood products.   CENTRAL LINE Performed by: Harvest Dark Consent: The procedure was performed in an emergent situation. Required items: required blood products, implants, devices, and special equipment available Patient identity confirmed: arm band and provided  demographic data Time out: Immediately prior to procedure a "time out" was called to verify the correct patient, procedure, equipment, support staff and site/side marked as required. Indications: vascular access Anesthesia: local infiltration Local anesthetic: lidocaine 1% with epinephrine Anesthetic total: 3 ml Patient sedated: no Preparation: skin prepped with 2% chlorhexidine Skin prep agent dried: skin prep agent completely dried prior to procedure Sterile barriers: all five maximum sterile barriers used - cap, mask, sterile gown, sterile gloves, and large sterile sheet Hand hygiene: hand hygiene performed prior to central venous catheter insertion  Location details: Right IJ  Catheter type: triple lumen Catheter size: 8 Fr Pre-procedure: landmarks identified Ultrasound guidance: Yes Successful placement: yes Post-procedure: line sutured and dressing applied Assessment: blood return through all parts, free fluid flow, placement verified by x-ray and no pneumothorax on x-ray Patient tolerance: Patient tolerated the procedure well with no immediate complications.  Chest x-ray shows well-placed line.  Okay to use.    Peripheral IV insertion by physician Indication: Multiple failed attempts by nursing staff, need for IV access and/or blood samples for workup Performed under continuous real-time ultrasound visualization Area cleaned with chlorhexidine. 20-gauge IV successfully placed in the right antecubital fossa. 1 attempt, no complications, EBL 0.   CRITICAL CARE Performed by: Harvest Dark   Total critical care time: 60 minutes  Critical care time was exclusive of separately billable procedures and treating other patients.  Critical care was necessary to treat or prevent imminent or life-threatening deterioration.  Critical care was time spent personally by me on the following activities: development of treatment plan with patient and/or surrogate as well as  nursing, discussions with consultants, evaluation of patient's response to treatment, examination of patient, obtaining history from patient or surrogate, ordering and performing treatments and interventions, ordering and review of laboratory studies, ordering and review of radiographic studies, pulse oximetry and re-evaluation of patient's condition.  ____________________________________________   FINAL CLINICAL IMPRESSION(S) / ED DIAGNOSES  Altered mental status Anasarca Acute renal insufficiency Anemia Sepsis Pneumonia   Harvest Dark, MD 08/21/2018 1434    Harvest Dark, MD 08/29/2018 1538    Harvest Dark, MD 09/03/2018 1626

## 2018-08-31 NOTE — Consult Note (Signed)
[name] , MD [address] [place], [place], [place], [address] [address] [place], [place], [place], [address] Phone: [phone]  Fax: [phone]  Consultation  Referring Provider:   Dr Darvin Neighbours Primary Care Physician:  Remi Haggard, FNP Primary Gastroenterologist:None        Reason for Consultation:     GI bleed   Date of Admission:  08/13/2018 Date of Consultation:  08/28/2018         HPI:   [name] is a 62 y.o. male with a history of CHF and ejection fraction of 15%, CKD.  The to the ER hypotensive with lactic acidosis.  On admission hemoglobin 6.8 g with a platelet count of 77 white cell count of 3.9.  Hemoglobin 2 months back was 10.9 g.  Elevated troponin at 0.06, creatinine elevated at 1.86, 3 months back was 0.97.  BNP over 2500.  Urinalysis no infection seen.  Chest x-ray shows left lower lobe atelectasis or early infiltrate.  Been treated for severe sepsis. Central line inserted in the ER.  Patient unable to give any history . On bear hugger . Discussed with ER physician , being treated for pneumonia, no melena or hematochezia noted in the ER , no hematemesis   Past Medical History:  Diagnosis Date  . Anemia   . Anginal pain ([place])   . CHF (congestive heart failure) ([place])   . Collapse of lung 2018  . Decubitus skin ulcer since 2016-17   bilateral heels   . Depression   . Diabetes mellitus without complication ([place])   . Discontinued smoking   . Hypertension   . Metabolic encephalopathy 2458  . Peripheral vascular disease ([place])   . Renal failure (ARF), acute on chronic (HCC)   . Schizophrenia ([place])   . Sepsis ([place]) 2017  . Stroke ([place])   . Thrombocythemia ([place]) 2017  . Thyroid disease     Past Surgical History:  Procedure Laterality Date  . PERIPHERAL VASCULAR CATHETERIZATION Left 05/26/2016   Procedure: Lower Extremity Angiography;  Surgeon: Algernon Huxley, MD;  Location: [facility];  Service: Cardiovascular;  Laterality: Left;   . PERIPHERAL VASCULAR CATHETERIZATION  05/26/2016   Procedure: Lower Extremity Intervention;  Surgeon: Algernon Huxley, MD;  Location: [place] [facility];  Service: Cardiovascular;;  . PERIPHERAL VASCULAR CATHETERIZATION Right 06/05/2016   Procedure: Lower Extremity Angiography;  Surgeon: Algernon Huxley, MD;  Location: [facility];  Service: Cardiovascular;  Laterality: Right;  . periphervascular procedures      Prior to Admission medications   Medication Sig Start Date End Date Taking? Authorizing Provider  aspirin 81 MG chewable tablet Chew 1 tablet (81 mg total) by mouth daily. 03/10/18  Yes Dhungel, Nishant, MD  atorvastatin (LIPITOR) 10 MG tablet Take 1 tablet (10 mg total) by mouth daily. 05/26/16  Yes Dew, Erskine Squibb, MD  B Complex-C (B-COMPLEX WITH VITAMIN C) tablet Take 1 tablet by mouth daily.   Yes [provider]  clopidogrel (PLAVIX) 75 MG tablet Take 1 tablet (75 mg total) by mouth daily. 03/09/18 03/09/19 Yes Dhungel, Nishant, MD  docusate sodium (COLACE) 100 MG capsule Take 100 mg by mouth 2 (two) times daily.   Yes [provider]  ferrous sulfate 325 (65 FE) MG tablet Take 325 mg by mouth daily with breakfast.   Yes [provider]  folic acid (FOLVITE) 1 MG tablet Take 1 mg by mouth daily.   Yes [provider]  Infant Care Products (DERMACLOUD) CREA Apply topically as needed (for pressure ulcers).   Yes [provider]  levETIRAcetam (KEPPRA) 500 MG tablet Take 1 tablet (500 mg total) by mouth 2 (two) times daily. 03/09/18  Yes Dhungel, Nishant, MD  midodrine (PROAMATINE) 5 MG tablet Take 1 tablet (5 mg total) by mouth 2 (two) times daily with a meal. 03/09/18  Yes Dhungel, Nishant, MD  sacubitril-valsartan (ENTRESTO) 24-26 MG Take 1 tablet by mouth 2 (two) times daily.   Yes [provider]  sertraline (ZOLOFT) 50 MG tablet Take 50 mg by mouth at bedtime.   Yes [provider]    Family History  Family history  unknown: Yes     Social History   Tobacco Use  . Smoking status: Current Every Day Smoker    Packs/day: 1.00    Years: 30.00    Pack years: 30.00    Types: Cigarettes  . Smokeless tobacco: Never Used  Substance Use Topics  . Alcohol use: No    Alcohol/week: 0.0 standard drinks    Comment:  pt unable to respond. Hx of alcohol abuse (per pt )  . Drug use: No    Comment: unknown pt UTA    Allergies as of 08/19/2018  . (No Known Allergies)    Review of Systems:    ROS cannot be assessed    Physical Exam:  Vital signs in last 24 hours: Temp:  [89.8 F (32.1 C)-93 F (33.9 C)] 93 F (33.9 C) (10/22 1545) Pulse Rate:  [48-96] 56 (10/22 1545) Resp:  [10-35] 17 (10/22 1545) BP: (76-125)/(52-112) 81/65 (10/22 1545) SpO2:  [93 %-100 %] 96 % (10/22 1545) Weight:  [63.5 kg] 63.5 kg (10/22 1209)   General:   Quit and confused- unable to give any history  Head:  Normocephalic and atraumatic. Eyes:   No icterus.   Conjunctiva pink. PERRLA. Ears: Assess Neck:  Supple; no masses or thyroidomegaly.  Central line noted in the right side of the neck Lungs: Respirations even and unlabored.decreased air entry bilaterally  Heart:  Regular rate and rhythm;  Without murmur, clicks, rubs or gallops Abdomen:  Soft, nondistended, nontender. Normal bowel sounds. No appreciable masses or hepatomegaly.  No rebound or guarding.  Neurologic:  Alert and oriented x0; . Skin:  Intact without significant lesions or rashes. Cervical Nodes:  No significant cervical adenopathy. Psych: Not assessed  LAB RESULTS: Recent Labs    08/10/2018 1242  WBC 3.9*  HGB 6.8*  HCT 21.2*  PLT 77*   BMET Recent Labs    08/30/2018 1242  NA 149*  K 4.3  CL 118*  CO2 19*  GLUCOSE 104*  BUN 73*  CREATININE 1.86*  CALCIUM 9.1   LFT Recent Labs    09/01/2018 1242  PROT 6.8  ALBUMIN 3.9  AST 55*  ALT 72*  ALKPHOS 254*  BILITOT 2.9*   PT/INR No results for input(s): LABPROT, INR in the last 72  hours.  STUDIES: Dg Chest Portable 1 View  Result Date: 09/09/2018 CLINICAL DATA:  Central line placement EXAM: PORTABLE CHEST 1 VIEW COMPARISON:  08/15/2018, 05/26/2018 FINDINGS: Left-sided pacing device as before. Right-sided central venous catheter tip over the SVC allowing for marked patient rotation. No right pneumothorax. Cardiomegaly with vascular congestion and mild edema. Atelectasis or infiltrate at the left base. Possible small pleural effusions. IMPRESSION: 1. Right-sided central venous catheter tip projects over expected location of SVC allowing for marked patient rotation. No pneumothorax 2. Cardiomegaly with small pleural  effusions, vascular congestion and mild pulmonary edema. Atelectasis or pneumonia at the left base. Electronically Signed   By: Donavan Foil M.D.   On: 09/01/2018 15:56   Dg Chest Portable 1 View  Result Date: 08/14/2018 CLINICAL DATA:  Pt is a resident at a group home in [place]. Staff stated that pt was more lethargic and swollen than usual and they were not able to get any vitals on the pt. Pt is normally non verbal, hypothermia EXAM: PORTABLE CHEST 1 VIEW COMPARISON:  05/26/2018 FINDINGS: There is a LEFT-sided transvenous pacemaker with leads to the RIGHT ventricle. The heart size is enlarged and stable. There is mild pulmonary vascular congestion and probable early interstitial edema. There is asymmetric density in the LEFT lung base, consistent with atelectasis or early infiltrate. IMPRESSION: Cardiomegaly and mild interstitial pulmonary edema. LEFT LOWER lobe atelectasis or early infiltrate. Electronically Signed   By: Nolon Nations M.D.   On: 08/29/2018 13:39      Impression / Plan:   Arsh Feutz is a 62 y.o. y/o male severe CHF, CKD presented to the ER hypotensive in shock ,hypothermic concern for sepsis.  BNP over 4500, AKI and drop in hemoglobin from baseline by about 4 g.  At this point of time the patient is being stabilized from a  hemodynamic point of view.  For any endoscopy procedure the patient would need to be stable to tolerate anesthesia.  At this point of time there is no overt hemoptysis or hematochezia and we can wait for the patient to be stabilized before any endoscopy procedure can be entertained.  Suggest to continue supportive care with blood transfusion IV fluids.  Monitor CBC closely, IV PPI and I will reevaluate tomorrow to determine timing of any endoscopy if we feel indicated that needs to be done as an inpatient.  Thank you for involving me in the care of this patient.      LOS: 0 days   Larry Bellows, MD  08/10/2018, 4:10 PM

## 2018-08-31 NOTE — ED Notes (Signed)
Date and time results received: 08/15/2018  Test: lactic Critical Value: 2.2  Name of Provider Notified: [email or web address]  Orders Received? Or Actions Taken?: MD notified

## 2018-08-31 NOTE — ED Notes (Signed)
Daughter contact information Dustine Stickler   [phone]

## 2018-08-31 NOTE — H&P (Signed)
[facility] NAME: [name]    MR#:  [id number]  DATE OF BIRTH:  July 25, 1956  DATE OF ADMISSION:  08/11/2018  PRIMARY CARE PHYSICIAN: Remi Haggard, FNP   REQUESTING/REFERRING PHYSICIAN: Dr. Kerman Passey  CHIEF COMPLAINT:   Chief Complaint  Patient presents with  . Fatigue    HISTORY OF PRESENT ILLNESS:  [name]  is a 62 y.o. male with a known history of chronic systolic CHF with ejection fraction 15%, diabetes mellitus, CKD 3, schizophrenia, recurrent admissions for dehydration and hyponatremia presents to the hospital sent in from his group home due to lethargy.  Here patient has been found to be hypotensive, lactic acidosis, stool guaiac positive, acute blood loss anemia.  Patient unable to give any history.  History obtained from ED staff, discussed with daughter over the phone.  Patient has been ordered vancomycin, cefepime, 1 L normal saline bolus, 1 unit packed RBC O- transfusion  PAST MEDICAL HISTORY:   Past Medical History:  Diagnosis Date  . Anemia   . Anginal pain ([place])   . CHF (congestive heart failure) ([place])   . Collapse of lung 2018  . Decubitus skin ulcer since 2016-17   bilateral heels   . Depression   . Diabetes mellitus without complication ([place])   . Discontinued smoking   . Hypertension   . Metabolic encephalopathy 3086  . Peripheral vascular disease ([place])   . Renal failure (ARF), acute on chronic (HCC)   . Schizophrenia ([place])   . Sepsis ([place]) 2017  . Stroke ([place])   . Thrombocythemia ([place]) 2017  . Thyroid disease     PAST SURGICAL HISTORY:   Past Surgical History:  Procedure Laterality Date  . PERIPHERAL VASCULAR CATHETERIZATION Left 05/26/2016   Procedure: Lower Extremity Angiography;  Surgeon: Algernon Huxley, MD;  Location: [facility];  Service: Cardiovascular;  Laterality: Left;  . PERIPHERAL VASCULAR CATHETERIZATION  05/26/2016   Procedure: Lower Extremity Intervention;   Surgeon: Algernon Huxley, MD;  Location: [facility];  Service: Cardiovascular;;  . PERIPHERAL VASCULAR CATHETERIZATION Right 06/05/2016   Procedure: Lower Extremity Angiography;  Surgeon: Algernon Huxley, MD;  Location: [facility];  Service: Cardiovascular;  Laterality: Right;  . periphervascular procedures      SOCIAL HISTORY:   Social History   Tobacco Use  . Smoking status: Current Every Day Smoker    Packs/day: 1.00    Years: 30.00    Pack years: 30.00    Types: Cigarettes  . Smokeless tobacco: Never Used  Substance Use Topics  . Alcohol use: No    Alcohol/week: 0.0 standard drinks    Comment:  pt unable to respond. Hx of alcohol abuse (per pt )    FAMILY HISTORY:   Family History  Family history unknown: Yes    DRUG ALLERGIES:  No Known Allergies  REVIEW OF SYSTEMS:   Review of Systems  Unable to perform ROS: Mental status change    MEDICATIONS AT HOME:   Prior to Admission medications   Medication Sig Start Date End Date Taking? Authorizing Provider  aspirin 81 MG chewable tablet Chew 1 tablet (81 mg total) by mouth daily. 03/10/18  Yes Dhungel, Nishant, MD  atorvastatin (LIPITOR) 10 MG tablet Take 1 tablet (10 mg total) by mouth daily. 05/26/16  Yes Dew, Erskine Squibb, MD  B Complex-C (B-COMPLEX WITH VITAMIN C) tablet Take 1 tablet by mouth daily.   Yes [provider]  clopidogrel (PLAVIX) 75 MG tablet Take 1 tablet (75 mg total) by mouth daily. 03/09/18 03/09/19 Yes Dhungel, Nishant, MD  docusate sodium (COLACE) 100 MG capsule Take 100 mg by mouth 2 (two) times daily.   Yes [provider]  ferrous sulfate 325 (65 FE) MG tablet Take 325 mg by mouth daily with breakfast.   Yes [provider]  folic acid (FOLVITE) 1 MG tablet Take 1 mg by mouth daily.   Yes [provider]  [place] (DERMACLOUD) CREA Apply topically as needed (for pressure ulcers).   Yes [provider]  levETIRAcetam (KEPPRA) 500 MG  tablet Take 1 tablet (500 mg total) by mouth 2 (two) times daily. 03/09/18  Yes Dhungel, Nishant, MD  midodrine (PROAMATINE) 5 MG tablet Take 1 tablet (5 mg total) by mouth 2 (two) times daily with a meal. 03/09/18  Yes Dhungel, Nishant, MD  sacubitril-valsartan (ENTRESTO) 24-26 MG Take 1 tablet by mouth 2 (two) times daily.   Yes [provider]  sertraline (ZOLOFT) 50 MG tablet Take 50 mg by mouth at bedtime.   Yes [provider]     VITAL SIGNS:  Blood pressure (!) 78/64, pulse (!) 55, temperature (!) 91.7 F (33.2 C), resp. rate 15, height 5\' 8"  (1.727 m), weight 63.5 kg, SpO2 98 %.  PHYSICAL EXAMINATION:  Physical Exam  GENERAL:  62 y.o.-year-old patient lying in the bed, looks critically ill. EYES: Pupils equal, round, reactive to light and accommodation.  Pallor positive HEENT: Head atraumatic, normocephalic. Oropharynx and nasopharynx clear.  NECK:  Supple, no jugular venous distention. No thyroid enlargement, no tenderness.  LUNGS: Decreased air entry bilaterally CARDIOVASCULAR: S1, S2 , bradycardia ABDOMEN: Soft, nontender, nondistended. Bowel sounds present. No organomegaly or mass.  EXTREMITIES: No pedal edema, cyanosis, or clubbing. + 2 pedal & radial pulses b/l.   NEUROLOGIC: Not following instructions PSYCHIATRIC: The patient is lethargic  LABORATORY PANEL:   CBC Recent Labs  Lab 08/18/2018 1242  WBC 3.9*  HGB 6.8*  HCT 21.2*  PLT 77*   ------------------------------------------------------------------------------------------------------------------  Chemistries  Recent Labs  Lab 08/26/2018 1242  NA 149*  K 4.3  CL 118*  CO2 19*  GLUCOSE 104*  BUN 73*  CREATININE 1.86*  CALCIUM 9.1  AST 55*  ALT 72*  ALKPHOS 254*  BILITOT 2.9*   ------------------------------------------------------------------------------------------------------------------  Cardiac Enzymes Recent Labs  Lab 08/30/2018 1242  TROPONINI 0.06*    ------------------------------------------------------------------------------------------------------------------  RADIOLOGY:  Dg Chest Portable 1 View  Result Date: 09/06/2018 CLINICAL DATA:  Pt is a resident at a group home in [place]. Staff stated that pt was more lethargic and swollen than usual and they were not able to get any vitals on the pt. Pt is normally non verbal, hypothermia EXAM: PORTABLE CHEST 1 VIEW COMPARISON:  05/26/2018 FINDINGS: There is a LEFT-sided transvenous pacemaker with leads to the RIGHT ventricle. The heart size is enlarged and stable. There is mild pulmonary vascular congestion and probable early interstitial edema. There is asymmetric density in the LEFT lung base, consistent with atelectasis or early infiltrate. IMPRESSION: Cardiomegaly and mild interstitial pulmonary edema. LEFT LOWER lobe atelectasis or early infiltrate. Electronically Signed   By: Nolon Nations M.D.   On: 09/09/2018 13:39     IMPRESSION AND PLAN:   * Severe sepsis Source unclear.  Blood culture sent.  Will start broad-spectrum IV antibiotics.  Vancomycin and cefepime. Received 1 L bolus in the emergency room.  Will repeat fluid bolus. Patient's lactic  acidosis could also be due to hypotension from GI bleed. Wait for cultures Will need pressors if no improvement with fluid resuscitation.  *GI bleed with acute blood loss anemia.  Patient being transfused 1 unit packed RBC.  Will start Protonix IV.  Consult GI.  Transfuse to keep hemoglobin greater than 7.  *Acute metabolic encephalopathy due to sepsis and hypotension.  *Acute kidney injury over CKD stage III.  Due to hypotension and severe sepsis.  Monitor input and output.  Fluid resuscitation.  *Chronic systolic CHF with ejection fraction 15%.  Monitor for fluid overload.  DVT prophylaxis with SCDs  All the records are reviewed and case discussed with ED provider. Management plans discussed with the patient, family and  they are in agreement.  CODE STATUS: DNR  Patient had DNR orders during last 3 admissions.  Spoke and confirmed this with daughter Crue Otero  TOTAL CC TIME TAKING CARE OF THIS PATIENT: 80 minutes.   Leia Alf Macklin Jacquin M.D on 08/11/2018 at 3:12 PM  Between 7am to 6pm - Pager - [phone]  After 6pm go to [email or web address] - password [email or web address] [facility]  Office  [phone]  CC: Primary care physician; Remi Haggard, FNP  Note: This dictation was prepared with Dragon dictation along with smaller phrase technology. Any transcriptional errors that result from this process are unintentional.

## 2018-08-31 NOTE — ED Notes (Signed)
1 unit of emergency blood administered by this RN and Verified with RN Dee. 353ml of O positive emergent blood started at 163ml/hr

## 2018-09-01 ENCOUNTER — Inpatient Hospital Stay: Payer: Medicare Other

## 2018-09-01 ENCOUNTER — Inpatient Hospital Stay (HOSPITAL_COMMUNITY)
Admit: 2018-09-01 | Discharge: 2018-09-01 | Disposition: A | Discharge status: Home / Self Care | Payer: Medicare Other | Attending: Internal Medicine | Admitting: Internal Medicine

## 2018-09-01 DIAGNOSIS — I351 Nonrheumatic aortic (valve) insufficiency: Secondary | ICD-10-CM

## 2018-09-01 DIAGNOSIS — J9601 Acute respiratory failure with hypoxia: Secondary | ICD-10-CM

## 2018-09-01 DIAGNOSIS — I340 Nonrheumatic mitral (valve) insufficiency: Secondary | ICD-10-CM

## 2018-09-01 DIAGNOSIS — I5030 Unspecified diastolic (congestive) heart failure: Secondary | ICD-10-CM

## 2018-09-01 DIAGNOSIS — I5022 Chronic systolic (congestive) heart failure: Secondary | ICD-10-CM

## 2018-09-01 DIAGNOSIS — Z7189 Other specified counseling: Secondary | ICD-10-CM

## 2018-09-01 DIAGNOSIS — J189 Pneumonia, unspecified organism: Secondary | ICD-10-CM

## 2018-09-01 LAB — COMPREHENSIVE METABOLIC PANEL
ALT: 87 U/L — AB (ref 0–44)
AST: 83 U/L — AB (ref 15–41)
Albumin: 3.5 g/dL (ref 3.5–5.0)
Alkaline Phosphatase: 236 U/L — ABNORMAL HIGH (ref 38–126)
Anion gap: 11 (ref 5–15)
BILIRUBIN TOTAL: 5 mg/dL — AB (ref 0.3–1.2)
BUN: 75 mg/dL — AB (ref 6–20)
CO2: 19 mmol/L — ABNORMAL LOW (ref 22–32)
Calcium: 8 mg/dL — ABNORMAL LOW (ref 8.9–10.3)
Chloride: 121 mmol/L — ABNORMAL HIGH (ref 98–111)
Creatinine, Ser: 2.08 mg/dL — ABNORMAL HIGH (ref 0.61–1.24)
GFR calc Af Amer: 38 mL/min — ABNORMAL LOW (ref >60)
GFR calc non Af Amer: 33 mL/min — ABNORMAL LOW (ref >60)
GLUCOSE: 93 mg/dL (ref 70–99)
POTASSIUM: 4.4 mmol/L (ref 3.5–5.1)
Sodium: 151 mmol/L — ABNORMAL HIGH (ref 135–145)
TOTAL PROTEIN: 5.9 g/dL — AB (ref 6.5–8.1)

## 2018-09-01 LAB — PHOSPHORUS
Phosphorus: 6.5 mg/dL — ABNORMAL HIGH (ref 2.5–4.6)
Phosphorus: 6.8 mg/dL — ABNORMAL HIGH (ref 2.5–4.6)
Phosphorus: 7.4 mg/dL — ABNORMAL HIGH (ref 2.5–4.6)

## 2018-09-01 LAB — BASIC METABOLIC PANEL
ANION GAP: 11 (ref 5–15)
ANION GAP: 13 (ref 5–15)
Anion gap: 12 (ref 5–15)
BUN: 77 mg/dL — ABNORMAL HIGH (ref 6–20)
BUN: 83 mg/dL — ABNORMAL HIGH (ref 6–20)
BUN: 86 mg/dL — AB (ref 6–20)
CALCIUM: 8.2 mg/dL — AB (ref 8.9–10.3)
CHLORIDE: 120 mmol/L — AB (ref 98–111)
CHLORIDE: 119 mmol/L — AB (ref 98–111)
CO2: 18 mmol/L — ABNORMAL LOW (ref 22–32)
CO2: 22 mmol/L (ref 22–32)
CO2: 19 mmol/L — ABNORMAL LOW (ref 22–32)
CREATININE: 2.07 mg/dL — AB (ref 0.61–1.24)
Calcium: 7.8 mg/dL — ABNORMAL LOW (ref 8.9–10.3)
Calcium: 7.7 mg/dL — ABNORMAL LOW (ref 8.9–10.3)
Chloride: 122 mmol/L — ABNORMAL HIGH (ref 98–111)
Creatinine, Ser: 2.18 mg/dL — ABNORMAL HIGH (ref 0.61–1.24)
Creatinine, Ser: 2.26 mg/dL — ABNORMAL HIGH (ref 0.61–1.24)
GFR calc Af Amer: 36 mL/min — ABNORMAL LOW (ref >60)
GFR calc Af Amer: 35 mL/min — ABNORMAL LOW (ref >60)
GFR calc non Af Amer: 30 mL/min — ABNORMAL LOW (ref >60)
GFR, EST AFRICAN AMERICAN: 38 mL/min — AB (ref >60)
GFR, EST NON AFRICAN AMERICAN: 33 mL/min — AB (ref >60)
GFR, EST NON AFRICAN AMERICAN: 31 mL/min — AB (ref >60)
GLUCOSE: 83 mg/dL (ref 70–99)
GLUCOSE: 72 mg/dL (ref 70–99)
Glucose, Bld: 74 mg/dL (ref 70–99)
POTASSIUM: 4.3 mmol/L (ref 3.5–5.1)
Potassium: 4.5 mmol/L (ref 3.5–5.1)
Potassium: 4.6 mmol/L (ref 3.5–5.1)
SODIUM: 153 mmol/L — AB (ref 135–145)
SODIUM: 151 mmol/L — AB (ref 135–145)
Sodium: 152 mmol/L — ABNORMAL HIGH (ref 135–145)

## 2018-09-01 LAB — CBC
HCT: 24.3 % — ABNORMAL LOW (ref 39.0–52.0)
Hemoglobin: 8.3 g/dL — ABNORMAL LOW (ref 13.0–17.0)
MCH: 35.3 pg — AB (ref 26.0–34.0)
MCHC: 34.2 g/dL (ref 30.0–36.0)
MCV: 103.4 fL — AB (ref 80.0–100.0)
PLATELETS: 82 10*3/uL — AB (ref 150–400)
RBC: 2.35 MIL/uL — AB (ref 4.22–5.81)
RDW: 19.2 % — ABNORMAL HIGH (ref 11.5–15.5)
WBC: 6.6 10*3/uL (ref 4.0–10.5)
nRBC: 30.2 % — ABNORMAL HIGH (ref 0.0–0.2)

## 2018-09-01 LAB — BLOOD GAS, ARTERIAL
ACID-BASE DEFICIT: 7.1 mmol/L — AB (ref 0.0–2.0)
Bicarbonate: 17.7 mmol/L — ABNORMAL LOW (ref 20.0–28.0)
FIO2: 0.28
O2 SAT: 88.7 %
Patient temperature: 37
pCO2 arterial: 32 mmHg (ref 32.0–48.0)
pH, Arterial: 7.35 (ref 7.350–7.450)
pO2, Arterial: 59 mmHg — ABNORMAL LOW (ref 83.0–108.0)

## 2018-09-01 LAB — HEMOGLOBIN AND HEMATOCRIT, BLOOD
HCT: 25.6 % — ABNORMAL LOW (ref 39.0–52.0)
HCT: 25.7 % — ABNORMAL LOW (ref 39.0–52.0)
HEMATOCRIT: 25.7 % — AB (ref 39.0–52.0)
HEMOGLOBIN: 8.6 g/dL — AB (ref 13.0–17.0)
HEMOGLOBIN: 8.7 g/dL — AB (ref 13.0–17.0)
Hemoglobin: 8.7 g/dL — ABNORMAL LOW (ref 13.0–17.0)

## 2018-09-01 LAB — BLOOD GAS, VENOUS
Acid-base deficit: 6.6 mmol/L — ABNORMAL HIGH (ref 0.0–2.0)
Bicarbonate: 15.6 mmol/L — ABNORMAL LOW (ref 20.0–28.0)
O2 Saturation: 90.8 %
PCO2 VEN: 23 mmHg — AB (ref 44.0–60.0)
PO2 VEN: 58 mmHg — AB (ref 32.0–45.0)
Patient temperature: 37
pH, Ven: 7.44 — ABNORMAL HIGH (ref 7.250–7.430)

## 2018-09-01 LAB — URINE CULTURE: CULTURE: NO GROWTH

## 2018-09-01 LAB — MAGNESIUM
MAGNESIUM: 2.4 mg/dL (ref 1.7–2.4)
Magnesium: 2.5 mg/dL — ABNORMAL HIGH (ref 1.7–2.4)
Magnesium: 2.4 mg/dL (ref 1.7–2.4)

## 2018-09-01 LAB — ECHOCARDIOGRAM COMPLETE
HEIGHTINCHES: 67 in
Weight: 2158.74 oz

## 2018-09-01 LAB — PROCALCITONIN: PROCALCITONIN: 0.66 ng/mL

## 2018-09-01 LAB — LACTIC ACID, PLASMA
Lactic Acid, Venous: 1.6 mmol/L (ref 0.5–1.9)
Lactic Acid, Venous: 1.7 mmol/L (ref 0.5–1.9)

## 2018-09-01 MED ORDER — VASOPRESSIN 20 UNIT/ML IV SOLN
0.0300 [IU]/min | INTRAVENOUS | Status: DC
  Administered 2018-09-01 – 2018-09-03 (×4): 0.03 [IU]/min via INTRAVENOUS
  Filled 2018-09-01 (×4): qty 2

## 2018-09-01 MED ORDER — FREE WATER
100.0000 mL | Status: DC
  Administered 2018-09-02 – 2018-09-03 (×2): 100 mL

## 2018-09-01 MED ORDER — FREE WATER: 150.0000 mL | Status: DC

## 2018-09-01 MED ORDER — DOBUTAMINE IN D5W 4-5 MG/ML-% IV SOLN
2.5000 ug/kg/min | INTRAVENOUS | Status: DC
  Administered 2018-09-01 – 2018-09-03 (×2): 2.5 ug/kg/min via INTRAVENOUS
  Filled 2018-09-01 (×4): qty 250

## 2018-09-01 MED ORDER — SODIUM BICARBONATE 8.4 % IV SOLN
50.0000 meq | Freq: Once | INTRAVENOUS | Status: AC
  Administered 2018-09-01: 50 meq via INTRAVENOUS

## 2018-09-01 NOTE — Progress Notes (Signed)
[name] , MD [address], [place], [place], [place], [address] [address], [place], [place], [place], [address] Phone: [phone]  Fax: [phone]   [name] is being followed for gi bleed  Day 1 of follow up   Subjective: Patient unable to provide any history    Objective: Vital signs in last 24 hours: Vitals:   09/01/18 0400 09/01/18 0500 09/01/18 0600 09/01/18 0800  BP: 93/74 97/77 (!) 84/72 91/72  Pulse: 87 85 84 86  Resp: (!) 30 (!) 27 (!) 28 (!) 28  Temp: 98.2 F (36.8 C) 98.2 F (36.8 C) 98.1 F (36.7 C) 97.9 F (36.6 C)  TempSrc:    Bladder  SpO2: 91% (!) 88% 92% (!) 86%  Weight:  61.2 kg    Height:       Weight change:   Intake/Output Summary (Last 24 hours) at 09/01/2018 1003 Last data filed at 09/01/2018 2542 Gross per 24 hour  Intake 2577.09 ml  Output 165 ml  Net 2412.09 ml     Exam: Heart:: Regular rate and rhythm, S1S2 present or without murmur or extra heart sounds Lungs: normal, clear to auscultation and clear to auscultation and percussion Abdomen: soft, nontender, normal bowel sounds   Lab Results: @LABTEST2 @ Micro Results: Recent Results (from the past 240 hour(s))  Blood culture (routine x 2)     Status: None (Preliminary result)   Collection Time: 08/23/2018 12:44 PM  Result Value Ref Range Status   Specimen Description BLOOD RAC  Final   Special Requests   Final    BOTTLES DRAWN AEROBIC AND ANAEROBIC Blood Culture adequate volume   Culture   Final    NO GROWTH < 24 HOURS Performed at [facility], [address] [place], [place] [address]    Report Status PENDING  Incomplete  Blood culture (routine x 2)     Status: None (Preliminary result)   Collection Time: 08/25/2018 12:49 PM  Result Value Ref Range Status   Specimen Description BLOOD LFA  Final   Special Requests   Final    BOTTLES DRAWN AEROBIC AND ANAEROBIC Blood Culture results may not be optimal due to an inadequate volume of blood received  in culture bottles   Culture   Final    NO GROWTH < 24 HOURS Performed at [facility], [address] [place], [place] [address]    Report Status PENDING  Incomplete  MRSA PCR Screening     Status: None   Collection Time: 09/02/2018  6:06 PM  Result Value Ref Range Status   MRSA by PCR NEGATIVE NEGATIVE Final    Comment:        The GeneXpert MRSA Assay (FDA approved for NASAL specimens only), is one component of a comprehensive MRSA colonization surveillance program. It is not intended to diagnose MRSA infection nor to guide or monitor treatment for MRSA infections. Performed at [facility], [address] [place], [place] [address]    Studies/Results: Dg Chest Portable 1 View  Result Date: 08/30/2018 CLINICAL DATA:  Central line placement EXAM: PORTABLE CHEST 1 VIEW COMPARISON:  08/11/2018, 05/26/2018 FINDINGS: Left-sided pacing device as before. Right-sided central venous catheter tip over the SVC allowing for marked patient rotation. No right pneumothorax. Cardiomegaly with vascular congestion and mild edema. Atelectasis or infiltrate at the left base. Possible small pleural effusions. IMPRESSION: 1. Right-sided central venous catheter tip projects over expected location of SVC allowing for marked patient rotation. No pneumothorax 2. Cardiomegaly  with small pleural effusions, vascular congestion and mild pulmonary edema. Atelectasis or pneumonia at the left base. Electronically Signed   By: Donavan Foil M.D.   On: 09/09/2018 15:56   Dg Chest Portable 1 View  Result Date: 09/09/2018 CLINICAL DATA:  Pt is a resident at a group home in [place]. Staff stated that pt was more lethargic and swollen than usual and they were not able to get any vitals on the pt. Pt is normally non verbal, hypothermia EXAM: PORTABLE CHEST 1 VIEW COMPARISON:  05/26/2018 FINDINGS: There is a LEFT-sided transvenous pacemaker with leads to the RIGHT ventricle. The heart size is  enlarged and stable. There is mild pulmonary vascular congestion and probable early interstitial edema. There is asymmetric density in the LEFT lung base, consistent with atelectasis or early infiltrate. IMPRESSION: Cardiomegaly and mild interstitial pulmonary edema. LEFT LOWER lobe atelectasis or early infiltrate. Electronically Signed   By: Nolon Nations M.D.   On: 08/14/2018 13:39   Medications: I have reviewed the patient's current medications. Scheduled Meds: . mouth rinse  15 mL Mouth Rinse BID  . [START ON 09/25/2018] pantoprazole  40 mg Intravenous Q12H  . sodium chloride flush  10-40 mL Intracatheter Q12H  . sodium chloride flush  3 mL Intravenous Q12H   Continuous Infusions: . ceFEPime (MAXIPIME) IV Stopped (09/01/18 0431)  . metronidazole 500 mg (09/01/18 0603)  . norepinephrine (LEVOPHED) Adult infusion 24 mcg/min (09/01/18 0600)  . pantoprozole (PROTONIX) infusion 8 mg/hr (09/01/18 3151)  .  sodium bicarbonate (isotonic) infusion in sterile water 50 mL/hr at 09/01/18 0600  . vancomycin Stopped (09/01/18 0306)  . vasopressin (PITRESSIN) infusion - *FOR SHOCK* 0.03 Units/min (09/01/18 0600)   PRN Meds:.albuterol, ondansetron **OR** ondansetron (ZOFRAN) IV, sodium chloride flush CBC Latest Ref Rng & Units 09/01/2018 09/01/2018 09/01/2018  WBC 4.0 - 10.5 K/uL - 6.6 -  Hemoglobin 13.0 - 17.0 g/dL 8.7(L) 8.3(L) 8.6(L)  Hematocrit 39.0 - 52.0 % 25.7(L) 24.3(L) 25.6(L)  Platelets 150 - 400 K/uL - 82(L) -     Assessment: Active Problems:   Anemia [name] is a 62 y.o. y/o male severe CHF, CKD admitted with  Septic shock,AMS,pneumonoia ,hypothermia .  BNP over 4500, AKI and drop in hemoglobin from baseline by about 4 g. On plavix . Hb stable     Plan: 1. Continue supportive care- no EGD cannot be done for 5 days as he was on Plavix. If has bleeding in the interim get tagged RBC scan to localize source then we can evaluate options.  At present Hb stable and no active  bleed.    LOS: 1 day   Larry Bellows, MD 09/01/2018, 10:03 AM

## 2018-09-01 NOTE — Progress Notes (Addendum)
Name: [name] MRN: [id number] DOB: 1956-10-19     CONSULTATION DATE: 08/24/2018  Subjective & Objectives: remains on Levophed + vasopressin + Bicarb, improved mental status and oliguric   PAST MEDICAL HISTORY :   has a past medical history of Anemia, Anginal pain ([place]), CHF (congestive heart failure) ([place]), Collapse of lung (2018), Decubitus skin ulcer (since 2016-17), Depression, Diabetes mellitus without complication ([place]), Discontinued smoking, Hypertension, Metabolic encephalopathy ([place]), Peripheral vascular disease ([place]), Renal failure (ARF), acute on chronic ([place]), Schizophrenia ([place]), Sepsis ([place]) (2017), Stroke ([place]), Thrombocythemia ([place]) (2017), and Thyroid disease.  has a past surgical history that includes Cardiac catheterization (Left, 05/26/2016); Cardiac catheterization (05/26/2016); periphervascular procedures; and Cardiac catheterization (Right, 06/05/2016). Prior to Admission medications   Medication Sig Start Date End Date Taking? Authorizing Provider  aspirin 81 MG chewable tablet Chew 1 tablet (81 mg total) by mouth daily. 03/10/18  Yes Dhungel, Nishant, MD  atorvastatin (LIPITOR) 10 MG tablet Take 1 tablet (10 mg total) by mouth daily. 05/26/16  Yes Dew, Erskine Squibb, MD  B Complex-C (B-COMPLEX WITH VITAMIN C) tablet Take 1 tablet by mouth daily.   Yes [provider]  clopidogrel (PLAVIX) 75 MG tablet Take 1 tablet (75 mg total) by mouth daily. 03/09/18 03/09/19 Yes Dhungel, Nishant, MD  docusate sodium (COLACE) 100 MG capsule Take 100 mg by mouth 2 (two) times daily.   Yes [provider]  ferrous sulfate 325 (65 FE) MG tablet Take 325 mg by mouth daily with breakfast.   Yes [provider]  folic acid (FOLVITE) 1 MG tablet Take 1 mg by mouth daily.   Yes [provider]  [place] (DERMACLOUD) CREA Apply topically as needed (for pressure ulcers).   Yes [provider]  levETIRAcetam (KEPPRA) 500 MG tablet Take 1 tablet  (500 mg total) by mouth 2 (two) times daily. 03/09/18  Yes Dhungel, Nishant, MD  midodrine (PROAMATINE) 5 MG tablet Take 1 tablet (5 mg total) by mouth 2 (two) times daily with a meal. 03/09/18  Yes Dhungel, Nishant, MD  sacubitril-valsartan (ENTRESTO) 24-26 MG Take 1 tablet by mouth 2 (two) times daily.   Yes [provider]  sertraline (ZOLOFT) 50 MG tablet Take 50 mg by mouth at bedtime.   Yes [provider]   No Known Allergies  FAMILY HISTORY:  Family history is unknown by patient. SOCIAL HISTORY:  reports that he has been smoking cigarettes. He has a 30.00 pack-year smoking history. He has never used smokeless tobacco. He reports that he does not drink alcohol or use drugs.  REVIEW OF SYSTEMS:   Unable to obtain due to critical illness   VITAL SIGNS: Temp:  [89.8 F (32.1 C)-99.9 F (37.7 C)] 97.5 F (36.4 C) (10/23 1100) Pulse Rate:  [48-96] 80 (10/23 1100) Resp:  [0-35] 25 (10/23 1100) BP: (72-125)/(52-112) 84/74 (10/23 1100) SpO2:  [86 %-100 %] 87 % (10/23 1100) Weight:  [58.1 kg-63.5 kg] 61.2 kg (10/23 0500)  Physical Examination:  Lethargic, awake, responds to verbal stimulation by turing his head. Doesn't track and not following commands On [place], no distress, AE on Lt. < Rt. And no rales S1 & S2 audible and no murmur Benign abdomen with feeble peristalses Wasted extremities and no edema   ASSESSMENT / PLAN:  Acute respiratory failure. Tolerating [place]. DNR -Monitor ABG and optimize O2 therapy  Altered mental status with unresponsiveness due to toxic metabolic encephalopathy. Base line schizophrenia and encephalopathy. CT head 04/2018 Ole Lt. Post parietal  and watershed and old Lt. Central lacunar infarct. -Monitor neuro status -CT head when hemodynamically stable  Septic shock / cardiogenic etiology  -Optimize volume, pressers and monitor hemodynamics. -d/c Vanc (MRSA PCR -ve) + c/w Cefe -Monitor Procalcitonin and cultures.  Acute on  chronic HFrEF. h/o dilated cardiomyopathy and EF of 15% on ECHO 02/2018 s/p AICD. On Levophed + Vasopressin + Dobutamin  -Optimize volume and consider diuresis  Pneumonia and atelectasis. Lt. L airspace disease -Cefep + Flagyl -Monitor CXR + CBC + FIO2  Metabolic acidosis (improved) with lactic acidemia -Off Bicarb. gtt -Monitor renal panel, PH and LA  AKI and hypernatremia (worsening) with ATN, intravascular volume depletion and hypoperfusion -Optimize hemodynamics, avoid nephrotoxins, monitor renal panel and urine out put. -Renal U/S to r/o obstructive uropathy if no improvement  UTI -ABX as above and follow with cultures.  PAD on ASA + Plavix. No acute ischemia -Keep antiplat on hold  Elevated LFT with possible acute ischemic hepatitis -Optimize hemodynamics. Avoid hepatotoxins and monitor LFTs. -Follow with GI  Anemia requiring transfusion with no obvious bleeding.  -PPI -Monitor HB and keep > 8 gm/dl -GI is following and advised hemodynamic stability and bleeding scan if HB continued to drop.  Thrombocytopenia -Monitor Plat and keep > 50000.  DNR  DVT & GI prophylaxis. Continue with supportive care  Critical care time 45 min    Small Lt. pneumthorax s/p attempted placement of DHT -Monitor CXR and consider chest drain if worsening  He continues to be hypotensive on pressers and worsening mental status Mrs. Bugaj was updated and code status was readdressed and remains DNR. No CPR and no intubation. Poor outcome

## 2018-09-01 NOTE — Consult Note (Signed)
Pharmacy Antibiotic Note  [name] is a 62 y.o. male admitted on 09/07/2018 with sepsis.  Pharmacy has been consulted for cefepime dosing. Patient resides in group residency home, presented to the ED with fatigue and acute blood loss anemia. Source is currently unclear for septic etiology. Patient has CKD stage III.  Plan: Vancomycin discontinued per CCM rounds' discussion.  Continue Cefepime 2 g IV q12h and Flagyl 500 mg IV q8h.   Height: 5\' 7"  (170.2 cm) Weight: 134 lb 14.7 oz (61.2 kg) IBW/kg (Calculated) : 66.1  Temp (24hrs), Avg:95.1 F (35.1 C), Min:89.8 F (32.1 C), Max:99.9 F (37.7 C)  Recent Labs  Lab 08/23/2018 1242  09/08/2018 1820 08/10/2018 1918 08/11/2018 2223 09/01/18 0128 09/01/18 0440 09/01/18 0902  WBC 3.9*  --   --   --   --   --  6.6  --   CREATININE 1.86*  --   --  2.00*  --  2.07* 2.08* 2.18*  LATICACIDVEN  --    < > 1.5 1.4 1.7 1.6 1.7  --    < > = values in this interval not displayed.    Estimated Creatinine Clearance: 31.2 mL/min (A) (by C-G formula based on SCr of 2.18 mg/dL (H)).    No Known Allergies  Antimicrobials this admission: Cefepime 10/22 >>  Metronidazole 10/22 >>  Vancomycin 10/22 >> 10/23  Dose adjustments this admission: N/A  Microbiology results: 10/22 BCx: NG < 1 day 10/22 UCx: pending   MRSA PCR: (-)  Thank you for allowing pharmacy to be a part of this patient's care.   Paticia Stack, PharmD Pharmacy Resident  09/01/2018 11:30 AM

## 2018-09-01 NOTE — Consult Note (Signed)
Consultation Note Date: 09/01/2018   Patient Name: [name]  DOB: Jun 11, 1956  MRN: [id number]  Age / Sex: 62 y.o., male  PCP: Remi Haggard, FNP Referring Physician: Max Sane, MD  Reason for Consultation: Establishing goals of care  HPI/Patient Profile: 62 y.o. male  with past medical history of CHF (EF 15%), CKD 3, T2DM, schizophrenia, bipolar disorder, PAD, and recurrent admissions for dehydration admitted on 08/17/2018 with lethargy. Patient found to be hypotensive and anemic with a hgb of 6.6. Required blood transfusion. Also found to have lactic acid of 2.2 and BNP >4500. Elevated LFTs. Creatinine rising - now at 2.18. AMS thought to be due to toxic metabolic encephalopathy and baseline of schizophrenia. History of strokes. Diagnosed with septic shock and pna - requiring vasopressors. PMT consulted by critical care for [place].  Clinical Assessment and Goals of Care: I have reviewed medical records including EPIC notes, labs and imaging, received report from RN, assessed the patient and then spoke with patient's wife to discuss diagnosis prognosis, GOC, EOL wishes, disposition and options.  I introduced Palliative Medicine as specialized medical care for people living with serious illness. It focuses on providing relief from the symptoms and stress of a serious illness. The goal is to improve quality of life for both the patient and the family.  We discussed a brief life review of the patient.  Patient has 8 children - 6 girls and 2 boys. She tells me that [name] is the most involved. He has lived in a facility for at least three years - maybe more - she was a bit vague about this. He required care after multiple strokes/seizures. She tells me when she does visit he is always in bed and talks minimally. She tells me he requires assistance for ADLs and has to be fed.    We discussed his current illness and what it means in the larger context  of his on-going co-morbidities.  Natural disease trajectory and expectations at EOL were discussed. She acknowledges that he is very ill.  The difference between aggressive medical intervention and comfort care was considered in light of the patient's goals of care. Wife is considering this but wants to continue current care and see how patient responds.  Advance directives, concepts specific to code status, artifical feeding and hydration, and rehospitalization were considered and discussed.  Hospice and Palliative Care services outpatient were explained and offered.  I offered a family meeting to the wife to include patient's children; however, she declined. She tells me this would be overwhelming to the children and she would like to be the one to make decisions for her husband.  Questions and concerns were addressed.  The family was encouraged to call with questions or concerns.   Primary Decision Maker NEXT OF KIN - spouse - [name]    SUMMARY OF RECOMMENDATIONS   - initial discussion with wife - discussed severity of illness, quality of life, and continued aggressive measures vs comfort care - she is considering options but at this time would like to continue current care - she declined family meeting as she does not want children to be involved in decision making right now - will call her again tomorrow (10/24) to continue [place] discussions  Code Status/Advance Care Planning:  DNR   Symptom Management:   Per primary  Palliative Prophylaxis:   Aspiration, Bowel Regimen, Delirium Protocol, Frequent Pain Assessment and Turn Reposition  Additional Recommendations (Limitations, Scope, Preferences):  Full Scope Treatment  Psycho-social/Spiritual:  Desire for further Chaplaincy support:no  Additional Recommendations: Education on Hospice  Prognosis:   Unable to determine  Discharge Planning: To Be Determined      Primary Diagnoses: Present on Admission: .  Anemia   I have reviewed the medical record, interviewed the patient and family, and examined the patient. The following aspects are pertinent.  Past Medical History:  Diagnosis Date  . Anemia   . Anginal pain ([place])   . CHF (congestive heart failure) ([place])   . Collapse of lung 2018  . Decubitus skin ulcer since 2016-17   bilateral heels   . Depression   . Diabetes mellitus without complication ([place])   . Discontinued smoking   . Hypertension   . Metabolic encephalopathy 2355  . Peripheral vascular disease ([place])   . Renal failure (ARF), acute on chronic (HCC)   . Schizophrenia ([place])   . Sepsis ([place]) 2017  . Stroke ([place])   . Thrombocythemia ([place]) 2017  . Thyroid disease    Social History   Socioeconomic History  . Marital status: Single    Spouse name: Not on file  . Number of children: Not on file  . Years of education: Not on file  . Highest education level: Not on file  Occupational History  . Not on file  Social Needs  . Financial resource strain: Not on file  . Food insecurity:    Worry: Not on file    Inability: Not on file  . Transportation needs:    Medical: Not on file    Non-medical: Not on file  Tobacco Use  . Smoking status: Current Every Day Smoker    Packs/day: 1.00    Years: 30.00    Pack years: 30.00    Types: Cigarettes  . Smokeless tobacco: Never Used  Substance and Sexual Activity  . Alcohol use: No    Alcohol/week: 0.0 standard drinks    Comment:  pt unable to respond. Hx of alcohol abuse (per pt )  . Drug use: No    Comment: unknown pt UTA  . Sexual activity: Not Currently  Lifestyle  . Physical activity:    Days per week: Not on file    Minutes per session: Not on file  . Stress: Not on file  Relationships  . Social connections:    Talks on phone: Not on file    Gets together: Not on file    Attends religious service: Not on file    Active member of club or organization: Not on file    Attends meetings of clubs or organizations:  Not on file    Relationship status: Not on file  Other Topics Concern  . Not on file  Social History Narrative  . Not on file   Family History  Family history unknown: Yes   Scheduled Meds: . free water  100 mL Per Tube Q4H  . mouth rinse  15 mL Mouth Rinse BID  . [START ON 09/09/18] pantoprazole  40 mg Intravenous Q12H  . sodium chloride flush  10-40 mL Intracatheter Q12H  . sodium chloride flush  3 mL Intravenous Q12H   Continuous Infusions: . ceFEPime (MAXIPIME) IV Stopped (09/01/18 0431)  . DOBUTamine    . metronidazole Stopped (09/01/18 0703)  . norepinephrine (LEVOPHED) Adult infusion 15 mcg/min (09/01/18 1000)  . pantoprozole (PROTONIX) infusion 8 mg/hr (09/01/18 1000)  . vasopressin (PITRESSIN) infusion - *FOR SHOCK* 0.03 Units/min (09/01/18 1000)   PRN Meds:.albuterol, ondansetron **OR** ondansetron (ZOFRAN) IV, sodium chloride flush  No Known Allergies Review of Systems  Unable to perform ROS: Patient nonverbal    Physical Exam  Constitutional: He appears lethargic. He has a sickly appearance. No distress.  Cardiovascular: Normal rate and regular rhythm.  Pulmonary/Chest: No accessory muscle usage. No respiratory distress.  Neurological: He appears lethargic. He is disoriented.  Non-verbal  Skin: Skin is warm and dry.  Psychiatric: Cognition and memory are impaired.    Vital Signs: BP (!) 89/75   Pulse 78   Temp (!) 97.5 F (36.4 C) (Bladder)   Resp (!) 27   Ht 5\' 7"  (1.702 m)   Wt 61.2 kg   SpO2 91%   BMI 21.13 kg/m  Pain Scale: CPOT       SpO2: SpO2: 91 % O2 Device:SpO2: 91 % O2 Flow Rate: .O2 Flow Rate (L/min): 6 L/min  IO: Intake/output summary:   Intake/Output Summary (Last 24 hours) at 09/01/2018 1453 Last data filed at 09/01/2018 1000 Gross per 24 hour  Intake 3048.78 ml  Output 165 ml  Net 2883.78 ml    LBM: Last BM Date: (UTA-pt not communicating) Baseline Weight: Weight: 63.5 kg Most recent weight: Weight: 61.2 kg       Palliative Assessment/Data: PPS 20%    Time Total: 60 minutes Greater than 50%  of this time was spent counseling and coordinating care related to the above assessment and plan.  Juel Burrow, DNP, AGNP-C Palliative Medicine Team [phone] Pager: [phone]

## 2018-09-01 NOTE — NC FL2 (Signed)
[place] LEVEL OF CARE SCREENING TOOL     IDENTIFICATION  Patient Name: [name] Birthdate: 1956/08/23 Sex: male Admission Date (Current Location): 09/08/2018  [place] and [place] Number:  Engineering geologist and Address:  [facility], [address], [place], [place] [address]      Provider Number: [id number]  Attending Physician Name and Address:  Max Sane, MD  Relative Name and Phone Number:  [name]- wife [phone]    Current Level of Care: Hospital Recommended Level of Care: [facility] Prior Approval Number:    Date Approved/Denied:   PASRR Number:    Discharge Plan: Other (Comment)(Family Care Home )    Current Diagnoses: Patient Active Problem List   Diagnosis Date Noted  . FTT (failure to thrive) in adult 05/26/2018  . CHF (congestive heart failure) ([place])   . Dehydration, severe 05/25/2018  . Leukocytopenia 05/25/2018  . Bradycardia 05/25/2018  . Pressure injury of skin 05/08/2018  . ARF (acute renal failure) ([place]) 05/05/2018  . Hypotension 03/09/2018  . Pancytopenia ([place]) 03/06/2018  . Troponin level elevated 03/06/2018  . Schizophrenia ([place]) 03/06/2018  . History of stroke 03/06/2018  . PVD (peripheral vascular disease) ([place]) 03/06/2018  . Type 2 diabetes mellitus without complication ([place]) 93/23/5573  . Protein-calorie malnutrition, severe 01/16/2017  . Acute hypernatremia 01/14/2017  . Collapse of lung   . Mucus plugging of bronchi   . Acute on chronic respiratory failure ([place])   . Pressure ulcer 02/04/2016  . Right lower lobe pneumonia ([place]) 02/01/2016  . Septic shock ([place]) 02/01/2016  . Hypernatremia 02/01/2016  . Acute renal failure (ARF) ([place]) 02/01/2016  . Lactic acidosis 02/01/2016  . Metabolic encephalopathy 22/12/5425  . Thrombocytopenia ([place]) 02/01/2016  . Anemia 02/01/2016  . Sepsis ([place]) 02/01/2016    Orientation RESPIRATION BLADDER Height & Weight     Self  O2(6  liters ) Indwelling catheter Weight: 134 lb 14.7 oz (61.2 kg) Height:  5\' 7"  (170.2 cm)  BEHAVIORAL SYMPTOMS/MOOD NEUROLOGICAL BOWEL NUTRITION STATUS  (none) (none) Continent Diet(NPO )  AMBULATORY STATUS COMMUNICATION OF NEEDS Skin   Limited Assist Verbally Normal                       Personal Care Assistance Level of Assistance  Bathing, Dressing, Feeding Bathing Assistance: Limited assistance Feeding assistance: Limited assistance Dressing Assistance: Limited assistance     Functional Limitations Info  Sight, Hearing, Speech Sight Info: Adequate Hearing Info: Adequate Speech Info: Adequate    SPECIAL CARE FACTORS FREQUENCY                       Contractures Contractures Info: Not present    Additional Factors Info  Code Status, Allergies Code Status Info: DNR Allergies Info: NKA           Current Medications (09/01/2018):  This is the current hospital active medication list Current Facility-Administered Medications  Medication Dose Route Frequency Provider Last Rate Last Dose  . albuterol (PROVENTIL) (2.5 MG/3ML) 0.083% nebulizer solution 2.5 mg  2.5 mg Nebulization Q2H PRN Sudini, Srikar, MD      . ceFEPIme (MAXIPIME) 2 g in sodium chloride 0.9 % 100 mL IVPB  2 g Intravenous Q12H Shari Prows, RPH   Stopped at 09/01/18 0431  . DOBUTamine (DOBUTREX) infusion 4000 mcg/mL  2.5 mcg/kg/min Intravenous Continuous Samaan, Maged, MD      . free water 100 mL  100 mL  Per Tube Q4H Cassandria Santee, MD      . MEDLINE mouth rinse  15 mL Mouth Rinse BID Darel Hong D, NP   15 mL at 09/01/18 0915  . metroNIDAZOLE (FLAGYL) IVPB 500 mg  500 mg Intravenous Debe Coder, MD 100 mL/hr at 09/01/18 1510 500 mg at 09/01/18 1510  . norepinephrine (LEVOPHED) 16 mg in D5W 267mL premix infusion  0-40 mcg/min Intravenous Titrated Samaan, Maged, MD 14.06 mL/hr at 09/01/18 1000 15 mcg/min at 09/01/18 1000  . ondansetron (ZOFRAN) tablet 4 mg  4 mg Oral Q6H PRN  Hillary Bow, MD       Or  . ondansetron (ZOFRAN) injection 4 mg  4 mg Intravenous Q6H PRN Sudini, Alveta Heimlich, MD      . pantoprazole (PROTONIX) 80 mg in sodium chloride 0.9 % 250 mL (0.32 mg/mL) infusion  8 mg/hr Intravenous Continuous Sudini, Srikar, MD 25 mL/hr at 09/01/18 1000 8 mg/hr at 09/01/18 1000  . [START ON 09/12/2018] pantoprazole (PROTONIX) injection 40 mg  40 mg Intravenous Q12H Sudini, Srikar, MD      . sodium chloride flush (NS) 0.9 % injection 10-40 mL  10-40 mL Intracatheter Q12H Max Sane, MD   10 mL at 09/01/18 0915  . sodium chloride flush (NS) 0.9 % injection 10-40 mL  10-40 mL Intracatheter PRN Manuella Ghazi, Vipul, MD      . sodium chloride flush (NS) 0.9 % injection 3 mL  3 mL Intravenous Q12H Hillary Bow, MD   3 mL at 09/01/18 0915  . vasopressin (PITRESSIN) 40 Units in sodium chloride 0.9 % 250 mL (0.16 Units/mL) infusion  0.03 Units/min Intravenous Continuous Darel Hong D, NP 11.25 mL/hr at 09/01/18 1000 0.03 Units/min at 09/01/18 1000     Discharge Medications: Please see discharge summary for a list of discharge medications.  Relevant Imaging Results:  Relevant Lab Results:   Additional Information    [name]  [name], LCSWA

## 2018-09-01 NOTE — Clinical Social Work Note (Signed)
Clinical Social Work Assessment  Patient Details  Name: [name] MRN: [id number] Date of Birth: 1956/08/26  Date of referral:  09/01/18               Reason for consult:  Facility Placement                Permission sought to share information with:  Case Manager, Customer service manager, Family Supports Permission granted to share information::  Yes, Verbal Permission Granted  Name::        Agency::     Relationship::     Contact Information:     Housing/Transportation Living arrangements for the past 2 months:  Group Home Source of Information:  Spouse Patient Interpreter Needed:  None Criminal Activity/Legal Involvement Pertinent to Current Situation/Hospitalization:  No - Comment as needed Significant Relationships:  Adult Children, Spouse Lives with:  Facility Resident Do you feel safe going back to the place where you live?  Yes Need for family participation in patient care:  Yes (Comment)  Care giving concerns:  Patient is from a family care home    Social Worker assessment / plan:  CSW consulted for facility placement. CSW noted in chart that patient is from [facility] family home in [place]. CSW attempted to speak with patient but he is asleep and CSW unable to wake him at this time. Per nursing staff, patient is alert to self only at this time. CSW contacted patient's wife Kyrie Fludd [phone]. Wife states that patient has been living at [facility] care home for about 2 years now and they would like patient to return to the group home. Wife states that patient has declined in the past few months and she understands that patient may need increased care but her goal is to have patient return to his normal environment. CSW noted in chart review that palliative is also involved with this case. CSW contacted [facility] [phone] and spoke with Neoma Laming. Neoma Laming states that patient is a long term resident there and can return when ready for discharge. CSW  will continue to follow for discharge planning.   Employment status:  Disabled (Comment on whether or not currently receiving Disability) Insurance information:  Medicare PT Recommendations:  Not assessed at this time Information / Referral to community resources:     Patient/Family's Response to care:  Wife thanked CSW for assistance   Patient/Family's Understanding of and Emotional Response to Diagnosis, Current Treatment, and Prognosis:  Wife states she understands that patient is declining and may need more assistance   Emotional Assessment Appearance:  Appears stated age Attitude/Demeanor/Rapport:  Unable to Assess Affect (typically observed):  Unable to Assess Orientation:  Oriented to Self Alcohol / Substance use:  Not Applicable Psych involvement (Current and /or in the community):  No (Comment)  Discharge Needs  Concerns to be addressed:  Discharge Planning Concerns Readmission within the last 30 days:  No Current discharge risk:  None Barriers to Discharge:  Continued Medical Work up   [employer], [place] 09/01/2018, 3:35 PM

## 2018-09-01 NOTE — Progress Notes (Addendum)
[facility] [place] NAME: [name]    MR#:  [id number]  DATE OF BIRTH:  [birth year]  SUBJECTIVE:  CHIEF COMPLAINT:   Chief Complaint  Patient presents with  . Fatigue  remains hypotensive - still on pressors & Bicarb, critically sick looking, oliguric REVIEW OF SYSTEMS:  Review of Systems  Unable to perform ROS: Mental acuity    DRUG ALLERGIES:  No Known Allergies VITALS:  Blood pressure (!) 85/71, pulse 74, temperature 97.7 F (36.5 C), temperature source Bladder, resp. rate (!) 24, height 5\' 7"  (1.702 m), weight 61.2 kg, SpO2 96 %. PHYSICAL EXAMINATION:  Physical Exam  Constitutional: He appears lethargic. He appears cachectic. He appears toxic. He has a sickly appearance.  HENT:  Head: Normocephalic and atraumatic.  Eyes: Pupils are equal, round, and reactive to light. Conjunctivae and EOM are normal.  Neck: Normal range of motion. Neck supple. No tracheal deviation present. No thyromegaly present.  Cardiovascular: Normal rate, regular rhythm and normal heart sounds.  Pulmonary/Chest: Effort normal and breath sounds normal. No respiratory distress. He has no wheezes. He exhibits no tenderness.  Abdominal: Soft. Bowel sounds are normal. He exhibits no distension. There is no tenderness.  Musculoskeletal: Normal range of motion.  Neurological: He appears lethargic. He is disoriented. No cranial nerve deficit.  Skin: Skin is warm and dry. No rash noted.  Psychiatric:  Difficult to evaluate due to his current mental status   LABORATORY PANEL:  Male CBC Recent Labs  Lab 09/01/18 0440  09/01/18 1510  WBC 6.6  --   --   HGB 8.3*   < > 8.7*  HCT 24.3*   < > 25.7*  PLT 82*  --   --    < > = values in this interval not displayed.   ------------------------------------------------------------------------------------------------------------------ Chemistries  Recent Labs  Lab 09/01/18 0440  09/01/18 1515  NA 151*   < >  151*  K 4.4   < > 4.6  CL 121*   < > 119*  CO2 19*   < > 19*  GLUCOSE 93   < > 72  BUN 75*   < > 86*  CREATININE 2.08*   < > 2.26*  CALCIUM 8.0*   < > 7.7*  MG  --    < > 2.4  AST 83*  --   --   ALT 87*  --   --   ALKPHOS 236*  --   --   BILITOT 5.0*  --   --    < > = values in this interval not displayed.   RADIOLOGY:  Dg Chest Port 1 View  Result Date: 09/01/2018 CLINICAL DATA:  Pneumothorax. EXAM: PORTABLE CHEST 1 VIEW COMPARISON:  Radiograph of September 01, 2018. FINDINGS: Stable cardiomegaly. Single lead left-sided pacemaker is unchanged in position. Right internal jugular catheter is unchanged in position. Grossly stable left pneumothorax is noted compared to prior exam. Mild bibasilar subsegmental atelectasis is noted. Bony thorax is unremarkable. IMPRESSION: Grossly stable left pneumothorax compared to prior exam. Electronically Signed   By: Marijo Conception, M.D.   On: 09/01/2018 16:34   Dg Chest Port 1 View  Result Date: 09/01/2018 CLINICAL DATA:  Pneumothorax suspected, interval removal of the malpositioned OG tube EXAM: PORTABLE CHEST 1 VIEW COMPARISON:  09/01/2018 FINDINGS: Small left pneumothorax with suspected apical and basilar components. Apical pleural separation measures 13 mm. Right IJ central line tip upper SVC level. Single lead left subclavian  defibrillator noted. Heart is enlarged. Diffuse airspace disease versus edema again noted with basilar atelectasis. Small pleural effusions noted. IMPRESSION: Small left pneumothorax. Stable cardiomegaly with diffuse edema versus airspace disease. Bibasilar atelectasis/partial consolidation and pleural effusions as well. These results will be called to the ordering clinician or representative by the Radiologist Assistant, and communication documented in the PACS or zVision Dashboard. Electronically Signed   By: Jerilynn Mages.  Shick M.D.   On: 09/01/2018 14:05   Dg Chest Port 1 View  Result Date: 09/01/2018 CLINICAL DATA:  Follow-up  pneumonia. EXAM: PORTABLE CHEST 1 VIEW COMPARISON:  08/17/2018 and older exams. FINDINGS: Cardiac silhouette is mildly enlarged. Right internal jugular central venous line is stable, tip in the mid superior vena cava. There are thickened bronchovascular markings with hazy intervening airspace opacities, the latter most of it in the lung bases. Small pleural effusions. These findings are similar to the most recent prior study, the represent a change from an exam dated 05/26/2018. No pneumothorax. Stable left anterior chest wall AICD. IMPRESSION: 1. Findings consistent with congestive heart failure interstitial and mild hazy airspace pulmonary edema and small effusions. Findings similar to most recent prior exam. Electronically Signed   By: Lajean Manes M.D.   On: 09/01/2018 12:31   Dg Abd Portable 1v  Result Date: 09/01/2018 CLINICAL DATA:  Feeding tube placement EXAM: PORTABLE ABDOMEN - 1 VIEW COMPARISON:  09/01/2018 FINDINGS: Feeding tube extends within the left mainstem bronchus and into the left lower lobe projects over the left basilar pleural space. Right IJ central line tip upper SVC.  Defibrillator noted. Cardiomegaly evident with diffuse interstitial opacities and small pleural effusions edema is favored versus pneumonia. Degenerative changes of the spine. Nonobstructive bowel gas pattern. IMPRESSION: Feeding tube extends into the left lower lobe but projects over the pleural space. Therefore, patient at risk for pneumothorax. Stable cardiomegaly, diffuse edema versus airspace disease, and small pleural effusions These results were called by telephone at the time of interpretation on 09/01/2018 at 1:14 pm to Dr. Marda Stalker , who verbally acknowledged these results. Electronically Signed   By: Jerilynn Mages.  Shick M.D.   On: 09/01/2018 13:14   Dg Abd Portable 1v  Result Date: 09/01/2018 CLINICAL DATA:  OG tube placement. EXAM: PORTABLE ABDOMEN - 1 VIEW COMPARISON:  Abdominal radiograph dated 02/04/2016  FINDINGS: The OG tube enters the left mainstem bronchus and extends inferiorly, laterally and probably into the posterior left costophrenic sulcus. The patient has risk for pneumothorax after this tube is removed since it may be within the pleural space. Cardiomegaly. AICD in place. Patchy infiltrates at both lung bases which could represent pulmonary edema or pneumonia. There is suggestion of a central venous catheter with the tip at the level of the carina. IMPRESSION: 1. The OG tube is in the left lung and possibly extends into the pleural space. Follow-up chest x-ray is recommended after tube removal since the patient is at risk for pneumothorax as described above. 2. Bilateral pulmonary infiltrates suggesting pulmonary edema or pneumonia. Critical Value/emergent results were called by telephone at the time of interpretation on 09/01/2018 at 12:51 pm to Blue Ball, RN , who verbally acknowledged these results. Negative. Electronically Signed   By: Lorriane Shire M.D.   On: 09/01/2018 12:54   ASSESSMENT AND PLAN:  62 y.o. male with a known history of chronic systolic CHF with ejection fraction 15%, diabetes mellitus, CKD 3, schizophrenia, recurrent admissions for dehydration and hyponatremia presents to the hospital sent in from his group home due to  lethargy  * Septic shock with multiorgan failure - likely due to PNA, await culture results, procalcitonin - requiring pressors - continue cefepime and Metro  *GI bleed with acute blood loss anemia: s/p 1 unit packed RBC.  continue Protonix IV.  appreciate GI input.  Transfuse to keep hemoglobin greater than 7. - no obvious source of bleeding - if Hb drops - may need bleeding scan  *Acute metabolic encephalopathy due to sepsis with underlying schizophrenia   *Acute kidney injury over CKD stage III.  Due to ATN from hypotension and severe sepsis.  Monitor input and output.  Fluid resuscitation.  *Acute on Chronic systolic CHF with ejection fraction  15% s/p AICD.  Monitor for fluid overload.  * Acute hypoxic resp failure: due to pna & atelectasis. on [place] o2 - continue Abx as above  * Acute Metabolic acidosis - improving    Consider Hospice, await palliative care input  All the records are reviewed and case discussed with Care Management/Social Worker. Management plans discussed with the patient, ICU attending and nursing and they are in agreement.  CODE STATUS: DNR  TOTAL TIME TAKING CARE OF THIS PATIENT: 15 minutes.   More than 50% of the time was spent in counseling/coordination of care: YES  Poor prognosis   Max Sane M.D on 09/01/2018 at 4:59 PM  Between 7am to 6pm - Pager - [phone]  After 6pm go to [email or web address] - Proofreader  [facility]  Office  [phone]  CC: Primary care physician; Remi Haggard, FNP  Note: This dictation was prepared with Dragon dictation along with smaller phrase technology. Any transcriptional errors that result from this process are unintentional.

## 2018-09-01 NOTE — Progress Notes (Signed)
*  PRELIMINARY RESULTS* Echocardiogram 2D Echocardiogram has been performed.  [name] 09/01/2018, 1:26 PM

## 2018-09-01 NOTE — Progress Notes (Signed)
Abg collected instead of vbg per [place] Breeze np

## 2018-09-02 ENCOUNTER — Inpatient Hospital Stay: Payer: Medicare Other

## 2018-09-02 DIAGNOSIS — J189 Pneumonia, unspecified organism: Secondary | ICD-10-CM

## 2018-09-02 DIAGNOSIS — Z7189 Other specified counseling: Secondary | ICD-10-CM

## 2018-09-02 DIAGNOSIS — Z515 Encounter for palliative care: Secondary | ICD-10-CM

## 2018-09-02 LAB — BPAM RBC
BLOOD PRODUCT EXPIRATION DATE: 201911112359
BLOOD PRODUCT EXPIRATION DATE: 201911182359
BLOOD PRODUCT EXPIRATION DATE: 201911182359
BLOOD PRODUCT EXPIRATION DATE: 201911182359
Blood Product Expiration Date: 201911082359
ISSUE DATE / TIME: 201910221355
ISSUE DATE / TIME: 201910221747
ISSUE DATE / TIME: 201910222028
UNIT TYPE AND RH: 5100
UNIT TYPE AND RH: 6200
Unit Type and Rh: 5100
Unit Type and Rh: 6200
Unit Type and Rh: 6200

## 2018-09-02 LAB — COMPREHENSIVE METABOLIC PANEL
ALT: 189 U/L — ABNORMAL HIGH (ref 0–44)
ANION GAP: 10 (ref 5–15)
AST: 226 U/L — ABNORMAL HIGH (ref 15–41)
Albumin: 3.2 g/dL — ABNORMAL LOW (ref 3.5–5.0)
Alkaline Phosphatase: 217 U/L — ABNORMAL HIGH (ref 38–126)
BILIRUBIN TOTAL: 5.3 mg/dL — AB (ref 0.3–1.2)
BUN: 96 mg/dL — ABNORMAL HIGH (ref 6–20)
CALCIUM: 7.6 mg/dL — AB (ref 8.9–10.3)
CO2: 21 mmol/L — ABNORMAL LOW (ref 22–32)
CREATININE: 2.55 mg/dL — AB (ref 0.61–1.24)
Chloride: 122 mmol/L — ABNORMAL HIGH (ref 98–111)
GFR calc non Af Amer: 26 mL/min — ABNORMAL LOW (ref >60)
GFR, EST AFRICAN AMERICAN: 30 mL/min — AB (ref >60)
GLUCOSE: 106 mg/dL — AB (ref 70–99)
Potassium: 4.3 mmol/L (ref 3.5–5.1)
Sodium: 153 mmol/L — ABNORMAL HIGH (ref 135–145)
TOTAL PROTEIN: 5.6 g/dL — AB (ref 6.5–8.1)

## 2018-09-02 LAB — TYPE AND SCREEN
ABO/RH(D): A POS
ANTIBODY SCREEN: NEGATIVE
UNIT DIVISION: 0
Unit division: 0
Unit division: 0
Unit division: 0
Unit division: 0

## 2018-09-02 LAB — CALCIUM, IONIZED
Calcium, Ionized, Serum: 4.5 mg/dL (ref 4.5–5.6)
Calcium, Ionized, Serum: 4.5 mg/dL (ref 4.5–5.6)
Calcium, Ionized, Serum: 4.2 mg/dL — ABNORMAL LOW (ref 4.5–5.6)
Calcium, Ionized, Serum: 4.1 mg/dL — ABNORMAL LOW (ref 4.5–5.6)

## 2018-09-02 LAB — CBC WITH DIFFERENTIAL/PLATELET
BAND NEUTROPHILS: 2 %
Basophils Absolute: 0 10*3/uL (ref 0.0–0.1)
Basophils Relative: 0 %
EOS ABS: 0 10*3/uL (ref 0.0–0.5)
EOS PCT: 0 %
HEMATOCRIT: 23.1 % — AB (ref 39.0–52.0)
Hemoglobin: 7.9 g/dL — ABNORMAL LOW (ref 13.0–17.0)
Lymphocytes Relative: 13 %
Lymphs Abs: 1 10*3/uL (ref 0.7–4.0)
MCH: 35.7 pg — AB (ref 26.0–34.0)
MCHC: 34.2 g/dL (ref 30.0–36.0)
MCV: 104.5 fL — ABNORMAL HIGH (ref 80.0–100.0)
MONO ABS: 0.2 10*3/uL (ref 0.1–1.0)
MONOS PCT: 2 %
Neutro Abs: 6.8 10*3/uL (ref 1.7–7.7)
Neutrophils Relative %: 83 %
Platelets: 56 10*3/uL — ABNORMAL LOW (ref 150–400)
RBC: 2.21 MIL/uL — ABNORMAL LOW (ref 4.22–5.81)
RDW: 21.4 % — ABNORMAL HIGH (ref 11.5–15.5)
WBC: 8 10*3/uL (ref 4.0–10.5)
nRBC: 15.1 % — ABNORMAL HIGH (ref 0.0–0.2)
nRBC: 24 /100 WBC — ABNORMAL HIGH

## 2018-09-02 LAB — HIV ANTIBODY (ROUTINE TESTING W REFLEX): HIV SCREEN 4TH GENERATION: NONREACTIVE

## 2018-09-02 LAB — PROCALCITONIN: Procalcitonin: 13.38 ng/mL

## 2018-09-02 LAB — HEMOGLOBIN AND HEMATOCRIT, BLOOD
HEMATOCRIT: 23.3 % — AB (ref 39.0–52.0)
Hemoglobin: 7.9 g/dL — ABNORMAL LOW (ref 13.0–17.0)

## 2018-09-02 LAB — PROTIME-INR
INR: 3.53
Prothrombin Time: 34.8 seconds — ABNORMAL HIGH (ref 11.4–15.2)

## 2018-09-02 LAB — PHOSPHORUS: PHOSPHORUS: 7.3 mg/dL — AB (ref 2.5–4.6)

## 2018-09-02 LAB — PREPARE RBC (CROSSMATCH)

## 2018-09-02 LAB — MAGNESIUM: Magnesium: 2.4 mg/dL (ref 1.7–2.4)

## 2018-09-02 MED ORDER — DEXTROSE 5 % IV SOLN
INTRAVENOUS | Status: DC
  Administered 2018-09-02: 11:00:00 via INTRAVENOUS

## 2018-09-02 MED ORDER — SODIUM CHLORIDE 0.9% IV SOLUTION
Freq: Once | INTRAVENOUS | Status: AC
  Administered 2018-09-02: 15:00:00 via INTRAVENOUS

## 2018-09-02 NOTE — Progress Notes (Signed)
[facility] [place] NAME: [name]    MR#:  [id number]  DATE OF BIRTH:  October 25, 1956  SUBJECTIVE:  CHIEF COMPLAINT:   Chief Complaint  Patient presents with  . Fatigue  remains critically sick REVIEW OF SYSTEMS:  Review of Systems  Unable to perform ROS: Mental acuity   DRUG ALLERGIES:  No Known Allergies VITALS:  Blood pressure (!) 78/62, pulse 87, temperature 100 F (37.8 C), resp. rate (!) 32, height 5\' 7"  (1.702 m), weight 61.7 kg, SpO2 94 %. PHYSICAL EXAMINATION:  Physical Exam  Constitutional: He appears lethargic. He appears cachectic. He appears toxic. He has a sickly appearance.  HENT:  Head: Normocephalic and atraumatic.  Eyes: Pupils are equal, round, and reactive to light. Conjunctivae and EOM are normal.  Neck: Normal range of motion. Neck supple. No tracheal deviation present. No thyromegaly present.  Cardiovascular: Normal rate, regular rhythm and normal heart sounds.  Pulmonary/Chest: Effort normal and breath sounds normal. No respiratory distress. He has no wheezes. He exhibits no tenderness.  Abdominal: Soft. Bowel sounds are normal. He exhibits no distension. There is no tenderness.  Musculoskeletal: Normal range of motion.  Neurological: He appears lethargic. He is disoriented. No cranial nerve deficit.  Skin: Skin is warm and dry. No rash noted.  Psychiatric:  Difficult to evaluate due to his current mental status   LABORATORY PANEL:  Male CBC Recent Labs  Lab 09/02/18 0410 09/02/18 1450  WBC 8.0  --   HGB 7.9* 7.9*  HCT 23.1* 23.3*  PLT 56*  --    ------------------------------------------------------------------------------------------------------------------ Chemistries  Recent Labs  Lab 09/02/18 0410  NA 153*  K 4.3  CL 122*  CO2 21*  GLUCOSE 106*  BUN 96*  CREATININE 2.55*  CALCIUM 7.6*  MG 2.4  AST 226*  ALT 189*  ALKPHOS 217*  BILITOT 5.3*   RADIOLOGY:  Dg Chest Port 1  View  Result Date: 09/02/2018 CLINICAL DATA:  Left-sided pneumothorax EXAM: PORTABLE CHEST 1 VIEW COMPARISON:  September 01, 2018 FINDINGS: Left-sided pneumothorax is again noted. This pneumothorax is seen in the apical and medial aspects on the left but is no longer appreciable laterally. There is no tension component. There are pleural effusions bilaterally with patchy airspace consolidation in both lower lobes, more on the left than on the right. There is cardiomegaly with pulmonary vascularity normal. Central catheter tip is in the superior vena cava. 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 No evident adenopathy. No bone lesions. IMPRESSION: Persistent pneumothorax on the left without tension component. Bilateral lower lobe airspace consolidation and pleural effusions present. Stable cardiomegaly. Central catheter tip in superior vena cava. Pacemaker lead attached to right ventricle. Electronically Signed   By: Lowella Grip III M.D.   On: 09/02/2018 09:47   Dg Chest Port 1 View  Result Date: 09/01/2018 CLINICAL DATA:  Pneumothorax EXAM: PORTABLE CHEST 1 VIEW COMPARISON:  None. FINDINGS: Left AICD remains in place, unchanged. Left lateral and apical pneumothorax again noted, likely stable. Cardiomegaly. Focal airspace opacities in the lower lobes are unchanged. IMPRESSION: Grossly stable left pneumothorax. Cardiomegaly. Bilateral lower lobe airspace opacities could reflect edema or pneumonia. Electronically Signed   By: Rolm Baptise M.D.   On: 09/01/2018 23:37   Dg Chest Port 1 View  Result Date: 09/01/2018 CLINICAL DATA:  Pneumothorax. EXAM: PORTABLE CHEST 1 VIEW COMPARISON:  Radiograph of September 01, 2018. FINDINGS: Stable cardiomegaly. Single lead left-sided pacemaker is unchanged in position. Right  internal jugular catheter is unchanged in position. Grossly stable left pneumothorax is noted compared to prior exam. Mild bibasilar subsegmental atelectasis is noted. Bony thorax is  unremarkable. IMPRESSION: Grossly stable left pneumothorax compared to prior exam. Electronically Signed   By: Marijo Conception, M.D.   On: 09/01/2018 16:34   ASSESSMENT AND PLAN:  62 y.o. male with a known history of chronic systolic CHF with ejection fraction 15%, diabetes mellitus, CKD 3, schizophrenia, recurrent admissions for dehydration and hyponatremia presents to the hospital sent in from his group home due to lethargy  * Septic shock with multiorgan failure - likely due to PNA, await culture results - requiring pressors - continue cefepime and Metro  *GI bleed with acute blood loss anemia: hb 7.9 today. s/p 1 unit packed RBC.  continue Protonix IV.  appreciate GI input.  Transfuse to keep hemoglobin greater than 7. - no obvious source of bleeding - if Hb drops - may need bleeding scan  *Acute metabolic encephalopathy due to sepsis with underlying schizophrenia   *Acute kidney injury over CKD stage III.  Due to ATN from hypotension and severe sepsis.  Monitor input and output.  Fluid resuscitation.  *Acute on Chronic systolic CHF with ejection fraction 15% s/p AICD.  Monitor for fluid overload.  * Acute hypoxic resp failure: due to pna & atelectasis. on  o2 - continue Abx as above  * Acute Metabolic acidosis - improving   Overall poor prognosis, may not survive.  Consider Hospice, await palliative care input  All the records are reviewed and case discussed with Care Management/Social Worker. Management plans discussed with the patient, ICU attending and nursing and they are in agreement.  CODE STATUS: DNR  TOTAL TIME TAKING CARE OF THIS PATIENT: 15 minutes.   More than 50% of the time was spent in counseling/coordination of care: YES  Poor prognosis   Max Sane M.D on 09/02/2018 at 3:50 PM  Between 7am to 6pm - Pager - [phone]  After 6pm go to [email or web address] - Proofreader  [facility]  Office  [phone]  CC:  Primary care physician; Remi Haggard, FNP  Note: This dictation was prepared with Dragon dictation along with smaller phrase technology. Any transcriptional errors that result from this process are unintentional.

## 2018-09-02 NOTE — Consult Note (Addendum)
Pharmacy Antibiotic Note  [name] is a 62 y.o. male admitted on 08/25/2018 with sepsis.  Pharmacy has been consulted for cefepime dosing. Patient resides in group residency home, presented to the ED with fatigue and acute blood loss anemia. Source is likely PNA. Patient has CKD stage III.   Plan: Continue Cefepime 2 g IV q12h and Flagyl 500 mg IV q8h.    Height: 5\' 7"  (170.2 cm) Weight: 136 lb 0.4 oz (61.7 kg) IBW/kg (Calculated) : 66.1  Temp (24hrs), Avg:96.8 F (36 C), Min:95.7 F (35.4 C), Max:99 F (37.2 C)  Recent Labs  Lab 08/14/2018 1242  08/30/2018 1820 08/29/2018 1918 08/29/2018 2223 09/01/18 0128 09/01/18 0440 09/01/18 0902 09/01/18 1515 09/02/18 0410  WBC 3.9*  --   --   --   --   --  6.6  --   --  8.0  CREATININE 1.86*  --   --  2.00*  --  2.07* 2.08* 2.18* 2.26* 2.55*  LATICACIDVEN  --    < > 1.5 1.4 1.7 1.6 1.7  --   --   --    < > = values in this interval not displayed.    Estimated Creatinine Clearance: 26.9 mL/min (A) (by C-G formula based on SCr of 2.55 mg/dL (H)).    No Known Allergies  Antimicrobials this admission: Cefepime 10/22 >>  Metronidazole 10/22 >>  Vancomycin 10/22 >> 10/23  Dose adjustments this admission: N/A  Microbiology results: 10/22 BCx: NG < 2 days 10/22 UCx: NG  MRSA PCR: (-)  Thank you for allowing pharmacy to be a part of this patient's care.   Paticia Stack, PharmD Pharmacy Resident  09/02/2018 12:46 PM

## 2018-09-02 NOTE — Consult Note (Signed)
[name] , MD [address] [place], [place], [place], [address] [address], [place], [place], [place], [address] Phone: [phone]  Fax: [phone]   [name] is being followed for Anemia   Subjective: Cannot give any history Objective: Vital signs in last 24 hours: Vitals:   09/02/18 0730 09/02/18 0800 09/02/18 0900 09/02/18 1000  BP: (!) 81/63 (!) 80/64 (!) 81/63 (!) 82/68  Pulse: 79 80 83 88  Resp: 18 19 (!) 22 10  Temp: (!) 95.9 F (35.5 C) (!) 96.3 F (35.7 C) (!) 97.5 F (36.4 C) 99 F (37.2 C)  TempSrc:      SpO2: 99% 99% 99% 99%  Weight:      Height:       Weight change: -1.804 kg  Intake/Output Summary (Last 24 hours) at 09/02/2018 1216 Last data filed at 09/02/2018 1000 Gross per 24 hour  Intake 1398.91 ml  Output 445 ml  Net 953.91 ml     Exam: Heart:: Regular rate and rhythm, S1S2 present or without murmur or extra heart sounds Lungs: Decreased air entry bilaterally, bilateral wheeze heard. Abdomen: soft, nontender, normal bowel sounds   Lab Results: @LABTEST2 @ Micro Results: Recent Results (from the past 240 hour(s))  Blood culture (routine x 2)     Status: None (Preliminary result)   Collection Time: 08/23/2018 12:44 PM  Result Value Ref Range Status   Specimen Description BLOOD RAC  Final   Special Requests   Final    BOTTLES DRAWN AEROBIC AND ANAEROBIC Blood Culture adequate volume   Culture   Final    NO GROWTH 2 DAYS Performed at [facility], [address], [place], [place] [address]    Report Status PENDING  Incomplete  Urine culture     Status: None   Collection Time: 08/20/2018 12:44 PM  Result Value Ref Range Status   Specimen Description   Final    URINE, RANDOM Performed at [facility], [address], [place], [place] [address]    Special Requests   Final    NONE Performed at [facility], [address], [place], [place] [address]    Culture   Final    NO  GROWTH Performed at [facility], [place] [address] [place], [place] [address]    Report Status 09/01/2018 FINAL  Final  Blood culture (routine x 2)     Status: None (Preliminary result)   Collection Time: 08/26/2018 12:49 PM  Result Value Ref Range Status   Specimen Description BLOOD LFA  Final   Special Requests   Final    BOTTLES DRAWN AEROBIC AND ANAEROBIC Blood Culture results may not be optimal due to an inadequate volume of blood received in culture bottles   Culture   Final    NO GROWTH 2 DAYS Performed at [facility], [address] [place], [place]  [address]    Report Status PENDING  Incomplete  MRSA PCR Screening     Status: None   Collection Time: 09/06/2018  6:06 PM  Result Value Ref Range Status   MRSA by PCR NEGATIVE NEGATIVE Final    Comment:        The GeneXpert MRSA Assay (FDA approved for NASAL specimens only), is one component of a comprehensive MRSA colonization surveillance program. It is not intended to diagnose MRSA infection nor to guide or monitor treatment for MRSA infections. Performed at [facility], [address], [place], [place] [address]  Studies/Results: Dg Chest Port 1 View  Result Date: 09/02/2018 CLINICAL DATA:  Left-sided pneumothorax EXAM: PORTABLE CHEST 1 VIEW COMPARISON:  September 01, 2018 FINDINGS: Left-sided pneumothorax is again noted. This pneumothorax is seen in the apical and medial aspects on the left but is no longer appreciable laterally. There is no tension component. There are pleural effusions bilaterally with patchy airspace consolidation in both lower lobes, more on the left than on the right. There is cardiomegaly with pulmonary vascularity normal. Central catheter tip is in the superior vena cava. 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 No evident adenopathy. No bone lesions. IMPRESSION: Persistent pneumothorax on the left without tension component. Bilateral lower lobe airspace  consolidation and pleural effusions present. Stable cardiomegaly. Central catheter tip in superior vena cava. Pacemaker lead attached to right ventricle. Electronically Signed   By: Lowella Grip III M.D.   On: 09/02/2018 09:47   Dg Chest Port 1 View  Result Date: 09/01/2018 CLINICAL DATA:  Pneumothorax EXAM: PORTABLE CHEST 1 VIEW COMPARISON:  None. FINDINGS: Left AICD remains in place, unchanged. Left lateral and apical pneumothorax again noted, likely stable. Cardiomegaly. Focal airspace opacities in the lower lobes are unchanged. IMPRESSION: Grossly stable left pneumothorax. Cardiomegaly. Bilateral lower lobe airspace opacities could reflect edema or pneumonia. Electronically Signed   By: Rolm Baptise M.D.   On: 09/01/2018 23:37   Dg Chest Port 1 View  Result Date: 09/01/2018 CLINICAL DATA:  Pneumothorax. EXAM: PORTABLE CHEST 1 VIEW COMPARISON:  Radiograph of September 01, 2018. FINDINGS: Stable cardiomegaly. Single lead left-sided pacemaker is unchanged in position. Right internal jugular catheter is unchanged in position. Grossly stable left pneumothorax is noted compared to prior exam. Mild bibasilar subsegmental atelectasis is noted. Bony thorax is unremarkable. IMPRESSION: Grossly stable left pneumothorax compared to prior exam. Electronically Signed   By: Marijo Conception, M.D.   On: 09/01/2018 16:34   Dg Chest Port 1 View  Result Date: 09/01/2018 CLINICAL DATA:  Pneumothorax suspected, interval removal of the malpositioned OG tube EXAM: PORTABLE CHEST 1 VIEW COMPARISON:  09/01/2018 FINDINGS: Small left pneumothorax with suspected apical and basilar components. Apical pleural separation measures 13 mm. Right IJ central line tip upper SVC level. Single lead left subclavian defibrillator noted. Heart is enlarged. Diffuse airspace disease versus edema again noted with basilar atelectasis. Small pleural effusions noted. IMPRESSION: Small left pneumothorax. Stable cardiomegaly with diffuse edema  versus airspace disease. Bibasilar atelectasis/partial consolidation and pleural effusions as well. These results will be called to the ordering clinician or representative by the Radiologist Assistant, and communication documented in the PACS or zVision Dashboard. Electronically Signed   By: Jerilynn Mages.  Shick M.D.   On: 09/01/2018 14:05   Dg Chest Port 1 View  Result Date: 09/01/2018 CLINICAL DATA:  Follow-up pneumonia. EXAM: PORTABLE CHEST 1 VIEW COMPARISON:  08/16/2018 and older exams. FINDINGS: Cardiac silhouette is mildly enlarged. Right internal jugular central venous line is stable, tip in the mid superior vena cava. There are thickened bronchovascular markings with hazy intervening airspace opacities, the latter most of it in the lung bases. Small pleural effusions. These findings are similar to the most recent prior study, the represent a change from an exam dated 05/26/2018. No pneumothorax. Stable left anterior chest wall AICD. IMPRESSION: 1. Findings consistent with congestive heart failure interstitial and mild hazy airspace pulmonary edema and small effusions. Findings similar to most recent prior exam. Electronically Signed   By: Lajean Manes M.D.   On: 09/01/2018 12:31   Dg  Chest Portable 1 View  Result Date: 09/02/2018 CLINICAL DATA:  Central line placement EXAM: PORTABLE CHEST 1 VIEW COMPARISON:  08/17/2018, 05/26/2018 FINDINGS: Left-sided pacing device as before. Right-sided central venous catheter tip over the SVC allowing for marked patient rotation. No right pneumothorax. Cardiomegaly with vascular congestion and mild edema. Atelectasis or infiltrate at the left base. Possible small pleural effusions. IMPRESSION: 1. Right-sided central venous catheter tip projects over expected location of SVC allowing for marked patient rotation. No pneumothorax 2. Cardiomegaly with small pleural effusions, vascular congestion and mild pulmonary edema. Atelectasis or pneumonia at the left base.  Electronically Signed   By: Donavan Foil M.D.   On: 09/05/2018 15:56   Dg Chest Portable 1 View  Result Date: 08/19/2018 CLINICAL DATA:  Pt is a resident at a group home in [place]. Staff stated that pt was more lethargic and swollen than usual and they were not able to get any vitals on the pt. Pt is normally non verbal, hypothermia EXAM: PORTABLE CHEST 1 VIEW COMPARISON:  05/26/2018 FINDINGS: There is a LEFT-sided transvenous pacemaker with leads to the RIGHT ventricle. The heart size is enlarged and stable. There is mild pulmonary vascular congestion and probable early interstitial edema. There is asymmetric density in the LEFT lung base, consistent with atelectasis or early infiltrate. IMPRESSION: Cardiomegaly and mild interstitial pulmonary edema. LEFT LOWER lobe atelectasis or early infiltrate. Electronically Signed   By: Nolon Nations M.D.   On: 08/17/2018 13:39   Dg Abd Portable 1v  Result Date: 09/01/2018 CLINICAL DATA:  Feeding tube placement EXAM: PORTABLE ABDOMEN - 1 VIEW COMPARISON:  09/01/2018 FINDINGS: Feeding tube extends within the left mainstem bronchus and into the left lower lobe projects over the left basilar pleural space. Right IJ central line tip upper SVC.  Defibrillator noted. Cardiomegaly evident with diffuse interstitial opacities and small pleural effusions edema is favored versus pneumonia. Degenerative changes of the spine. Nonobstructive bowel gas pattern. IMPRESSION: Feeding tube extends into the left lower lobe but projects over the pleural space. Therefore, patient at risk for pneumothorax. Stable cardiomegaly, diffuse edema versus airspace disease, and small pleural effusions These results were called by telephone at the time of interpretation on 09/01/2018 at 1:14 pm to Dr. Marda Stalker , who verbally acknowledged these results. Electronically Signed   By: Jerilynn Mages.  Shick M.D.   On: 09/01/2018 13:14   Dg Abd Portable 1v  Result Date: 09/01/2018 CLINICAL DATA:   OG tube placement. EXAM: PORTABLE ABDOMEN - 1 VIEW COMPARISON:  Abdominal radiograph dated 02/04/2016 FINDINGS: The OG tube enters the left mainstem bronchus and extends inferiorly, laterally and probably into the posterior left costophrenic sulcus. The patient has risk for pneumothorax after this tube is removed since it may be within the pleural space. Cardiomegaly. AICD in place. Patchy infiltrates at both lung bases which could represent pulmonary edema or pneumonia. There is suggestion of a central venous catheter with the tip at the level of the carina. IMPRESSION: 1. The OG tube is in the left lung and possibly extends into the pleural space. Follow-up chest x-ray is recommended after tube removal since the patient is at risk for pneumothorax as described above. 2. Bilateral pulmonary infiltrates suggesting pulmonary edema or pneumonia. Critical Value/emergent results were called by telephone at the time of interpretation on 09/01/2018 at 12:51 pm to [place], RN , who verbally acknowledged these results. Negative. Electronically Signed   By: Lorriane Shire M.D.   On: 09/01/2018 12:54   Medications: I have  reviewed the patient's current medications. Scheduled Meds: . free water  100 mL Per Tube Q4H  . mouth rinse  15 mL Mouth Rinse BID  . [START ON 13-Sep-2018] pantoprazole  40 mg Intravenous Q12H  . sodium chloride flush  10-40 mL Intracatheter Q12H  . sodium chloride flush  3 mL Intravenous Q12H   Continuous Infusions: . ceFEPime (MAXIPIME) IV Stopped (09/02/18 0233)  . dextrose 50 mL/hr at 09/02/18 1112  . DOBUTamine 7.5 mcg/kg/min (09/02/18 1000)  . metronidazole Stopped (09/02/18 1749)  . norepinephrine (LEVOPHED) Adult infusion Stopped (09/01/18 1754)  . pantoprozole (PROTONIX) infusion 8 mg/hr (09/02/18 1000)  . vasopressin (PITRESSIN) infusion - *FOR SHOCK* 0.03 Units/min (09/02/18 1000)   PRN Meds:.albuterol, ondansetron **OR** ondansetron (ZOFRAN) IV, sodium chloride flush  CBC  Latest Ref Rng & Units 09/02/2018 09/01/2018 09/01/2018  WBC 4.0 - 10.5 K/uL 8.0 - -  Hemoglobin 13.0 - 17.0 g/dL 7.9(L) 8.7(L) 8.7(L)  Hematocrit 39.0 - 52.0 % 23.1(L) 25.7(L) 25.7(L)  Platelets 150 - 400 K/uL 56(L) - -   Assessment: Active Problems:   Anemia   Pneumonia due to infectious organism   Goals of care, counseling/discussion [name] a 62 y.o.y/o malesevere CHF, CKD admitted with  Septic shock,AMS,pneumonoia ,hypothermia . BNP over 4500, AKI anddrop in hemoglobin from baseline byabout 4 g. On plavix .  Dropped today from 8.7 to 7.9 grams recently he is on inotropic support.  Blood pressure still holding around 80 systolic.  At this point of time he is not hemodynamically stable for any endoscopy evaluation.  If there is a concern for a GI bleed if you could start with a tagged RBC scan to locate source of bleed.  He has been on Plavix and hence endoscopy therapy would be very hard while the drug is still within his system.    LOS: 2 days   Larry Bellows, MD 09/02/2018, 12:16 PM

## 2018-09-02 NOTE — Progress Notes (Signed)
[facility]  ROUNDING NOTE   Subjective:  Patient known to [place] from prior admission. He is in a critically ill state at this point in time with respiratory failure, congestive heart failure, and acute renal failure. He also is significantly hypernatremic. Also appears to have septic shock. Pt is DNR status.     Objective:  Vital signs in last 24 hours:  Temp:  [95.7 F (35.4 C)-100 F (37.8 C)] 100 F (37.8 C) (10/24 1200) Pulse Rate:  [74-89] 87 (10/24 1200) Resp:  [10-32] 32 (10/24 1200) BP: (77-94)/(62-78) 78/62 (10/24 1200) SpO2:  [91 %-100 %] 97 % (10/24 1200) Weight:  [61.7 kg] 61.7 kg (10/24 0404)  Weight change: -1.804 kg Filed Weights   09/06/2018 1800 09/01/18 0500 09/02/18 0404  Weight: 58.1 kg 61.2 kg 61.7 kg    Intake/Output: I/O last 3 completed shifts: In: 3776.2 [I.V.:2239.5; Blood:290; IV Piggyback:1246.8] Out: 610 [Urine:610]   Intake/Output this shift:  Total I/O In: 247.4 [I.V.:247.4] Out: -   Physical Exam: General: Critically ill appearing  Head: Normocephalic, atraumatic. Dry oral mucosal membranes  Eyes: Anicteric  Neck: Supple, trachea midline  Lungs:  Coarse rales and rhonchi bilateral.   Heart: S1S2 tachycardic  Abdomen:  Soft, nontender, bowel sounds present  Extremities: trace peripheral edema.  Neurologic: Awake not following commands  Skin: Hyperpigmentation b/l LE's       Basic Metabolic Panel: Recent Labs  Lab 08/10/2018 1918 09/01/18 0128 09/01/18 0440 09/01/18 0902 09/01/18 1515 09/02/18 0410  NA 151* 152* 151* 153* 151* 153*  K 4.2 4.5 4.4 4.3 4.6 4.3  CL 123* 122* 121* 120* 119* 122*  CO2 19* 18* 19* 22 19* 21*  GLUCOSE 98 83 93 74 72 106*  BUN 74* 77* 75* 83* 86* 96*  CREATININE 2.00* 2.07* 2.08* 2.18* 2.26* 2.55*  CALCIUM 8.1* 8.2* 8.0* 7.8* 7.7* 7.6*  MG 2.5* 2.5*  --  2.4 2.4 2.4  PHOS 6.4* 6.5*  --  6.8* 7.4* 7.3*    Liver Function Tests: Recent Labs  Lab 08/10/2018 1242 09/01/18 0440  09/02/18 0410  AST 55* 83* 226*  ALT 72* 87* 189*  ALKPHOS 254* 236* 217*  BILITOT 2.9* 5.0* 5.3*  PROT 6.8 5.9* 5.6*  ALBUMIN 3.9 3.5 3.2*   No results for input(s): LIPASE, AMYLASE in the last 168 hours. No results for input(s): AMMONIA in the last 168 hours.  CBC: Recent Labs  Lab 08/30/2018 1242  09/01/18 0019 09/01/18 0440 09/01/18 0902 09/01/18 1510 09/02/18 0410  WBC 3.9*  --   --  6.6  --   --  8.0  NEUTROABS 2.4  --   --   --   --   --  6.8  HGB 6.8*   < > 8.6* 8.3* 8.7* 8.7* 7.9*  HCT 21.2*   < > 25.6* 24.3* 25.7* 25.7* 23.1*  MCV 112.8*  --   --  103.4*  --   --  104.5*  PLT 77*  --   --  82*  --   --  56*   < > = values in this interval not displayed.    Cardiac Enzymes: Recent Labs  Lab 08/10/2018 1242  TROPONINI 0.06*    BNP: Invalid input(s): POCBNP  CBG: Recent Labs  Lab 08/12/2018 1752  GLUCAP 114*    Microbiology: Results for orders placed or performed during the hospital encounter of 08/30/2018  Blood culture (routine x 2)     Status: None (Preliminary result)   Collection  Time: 08/30/2018 12:44 PM  Result Value Ref Range Status   Specimen Description BLOOD RAC  Final   Special Requests   Final    BOTTLES DRAWN AEROBIC AND ANAEROBIC Blood Culture adequate volume   Culture   Final    NO GROWTH 2 DAYS Performed at [facility], [address], [place], [place] [address]    Report Status PENDING  Incomplete  Urine culture     Status: None   Collection Time: 08/13/2018 12:44 PM  Result Value Ref Range Status   Specimen Description   Final    URINE, RANDOM Performed at [facility], [address] [place], [place] [address]    Special Requests   Final    NONE Performed at [facility], [address] [place], [place] [address]    Culture   Final    NO GROWTH Performed at [facility], [place] [address] [place], [place] [address]    Report Status 09/01/2018 FINAL  Final  Blood culture (routine x 2)      Status: None (Preliminary result)   Collection Time: 09/08/2018 12:49 PM  Result Value Ref Range Status   Specimen Description BLOOD LFA  Final   Special Requests   Final    BOTTLES DRAWN AEROBIC AND ANAEROBIC Blood Culture results may not be optimal due to an inadequate volume of blood received in culture bottles   Culture   Final    NO GROWTH 2 DAYS Performed at [facility], [address] [place], [place] [address]    Report Status PENDING  Incomplete  MRSA PCR Screening     Status: None   Collection Time: 08/10/2018  6:06 PM  Result Value Ref Range Status   MRSA by PCR NEGATIVE NEGATIVE Final    Comment:        The GeneXpert MRSA Assay (FDA approved for NASAL specimens only), is one component of a comprehensive MRSA colonization surveillance program. It is not intended to diagnose MRSA infection nor to guide or monitor treatment for MRSA infections. Performed at [facility], [address] [place],  [address]     Coagulation Studies: Recent Labs    09/09/2018 1918  LABPROT 21.7*  INR 1.92    Urinalysis: Recent Labs    08/30/2018 1244 08/12/2018 1432  COLORURINE AMBER* AMBER*  LABSPEC 1.012 1.012  PHURINE 5.0 5.0  GLUCOSEU NEGATIVE NEGATIVE  HGBUR NEGATIVE LARGE*  BILIRUBINUR NEGATIVE NEGATIVE  KETONESUR NEGATIVE NEGATIVE  PROTEINUR NEGATIVE 30*  NITRITE NEGATIVE NEGATIVE  LEUKOCYTESUR NEGATIVE TRACE*      Imaging: Dg Chest Port 1 View  Result Date: 09/02/2018 CLINICAL DATA:  Left-sided pneumothorax EXAM: PORTABLE CHEST 1 VIEW COMPARISON:  September 01, 2018 FINDINGS: Left-sided pneumothorax is again noted. This pneumothorax is seen in the apical and medial aspects on the left but is no longer appreciable laterally. There is no tension component. There are pleural effusions bilaterally with patchy airspace consolidation in both lower lobes, more on the left than on the right. There is cardiomegaly with pulmonary vascularity normal.  Central catheter tip is in the superior vena cava. 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 No evident adenopathy. No bone lesions. IMPRESSION: Persistent pneumothorax on the left without tension component. Bilateral lower lobe airspace consolidation and pleural effusions present. Stable cardiomegaly. Central catheter tip in superior vena cava. Pacemaker lead attached to right ventricle. Electronically Signed   By: Lowella Grip III M.D.   On: 09/02/2018 09:47  Dg Chest Port 1 View  Result Date: 09/01/2018 CLINICAL DATA:  Pneumothorax EXAM: PORTABLE CHEST 1 VIEW COMPARISON:  None. FINDINGS: Left AICD remains in place, unchanged. Left lateral and apical pneumothorax again noted, likely stable. Cardiomegaly. Focal airspace opacities in the lower lobes are unchanged. IMPRESSION: Grossly stable left pneumothorax. Cardiomegaly. Bilateral lower lobe airspace opacities could reflect edema or pneumonia. Electronically Signed   By: Rolm Baptise M.D.   On: 09/01/2018 23:37   Dg Chest Port 1 View  Result Date: 09/01/2018 CLINICAL DATA:  Pneumothorax. EXAM: PORTABLE CHEST 1 VIEW COMPARISON:  Radiograph of September 01, 2018. FINDINGS: Stable cardiomegaly. Single lead left-sided pacemaker is unchanged in position. Right internal jugular catheter is unchanged in position. Grossly stable left pneumothorax is noted compared to prior exam. Mild bibasilar subsegmental atelectasis is noted. Bony thorax is unremarkable. IMPRESSION: Grossly stable left pneumothorax compared to prior exam. Electronically Signed   By: Marijo Conception, M.D.   On: 09/01/2018 16:34   Dg Chest Port 1 View  Result Date: 09/01/2018 CLINICAL DATA:  Pneumothorax suspected, interval removal of the malpositioned OG tube EXAM: PORTABLE CHEST 1 VIEW COMPARISON:  09/01/2018 FINDINGS: Small left pneumothorax with suspected apical and basilar components. Apical pleural separation measures 13 mm. Right IJ central line tip upper SVC level.  Single lead left subclavian defibrillator noted. Heart is enlarged. Diffuse airspace disease versus edema again noted with basilar atelectasis. Small pleural effusions noted. IMPRESSION: Small left pneumothorax. Stable cardiomegaly with diffuse edema versus airspace disease. Bibasilar atelectasis/partial consolidation and pleural effusions as well. These results will be called to the ordering clinician or representative by the Radiologist Assistant, and communication documented in the PACS or zVision Dashboard. Electronically Signed   By: Jerilynn Mages.  Shick M.D.   On: 09/01/2018 14:05   Dg Chest Port 1 View  Result Date: 09/01/2018 CLINICAL DATA:  Follow-up pneumonia. EXAM: PORTABLE CHEST 1 VIEW COMPARISON:  08/23/2018 and older exams. FINDINGS: Cardiac silhouette is mildly enlarged. Right internal jugular central venous line is stable, tip in the mid superior vena cava. There are thickened bronchovascular markings with hazy intervening airspace opacities, the latter most of it in the lung bases. Small pleural effusions. These findings are similar to the most recent prior study, the represent a change from an exam dated 05/26/2018. No pneumothorax. Stable left anterior chest wall AICD. IMPRESSION: 1. Findings consistent with congestive heart failure interstitial and mild hazy airspace pulmonary edema and small effusions. Findings similar to most recent prior exam. Electronically Signed   By: Lajean Manes M.D.   On: 09/01/2018 12:31   Dg Chest Portable 1 View  Result Date: 09/07/2018 CLINICAL DATA:  Central line placement EXAM: PORTABLE CHEST 1 VIEW COMPARISON:  09/07/2018, 05/26/2018 FINDINGS: Left-sided pacing device as before. Right-sided central venous catheter tip over the SVC allowing for marked patient rotation. No right pneumothorax. Cardiomegaly with vascular congestion and mild edema. Atelectasis or infiltrate at the left base. Possible small pleural effusions. IMPRESSION: 1. Right-sided central venous  catheter tip projects over expected location of SVC allowing for marked patient rotation. No pneumothorax 2. Cardiomegaly with small pleural effusions, vascular congestion and mild pulmonary edema. Atelectasis or pneumonia at the left base. Electronically Signed   By: Donavan Foil M.D.   On: 08/16/2018 15:56   Dg Abd Portable 1v  Result Date: 09/01/2018 CLINICAL DATA:  Feeding tube placement EXAM: PORTABLE ABDOMEN - 1 VIEW COMPARISON:  09/01/2018 FINDINGS: Feeding tube extends within the left mainstem bronchus and into the left lower lobe  projects over the left basilar pleural space. Right IJ central line tip upper SVC.  Defibrillator noted. Cardiomegaly evident with diffuse interstitial opacities and small pleural effusions edema is favored versus pneumonia. Degenerative changes of the spine. Nonobstructive bowel gas pattern. IMPRESSION: Feeding tube extends into the left lower lobe but projects over the pleural space. Therefore, patient at risk for pneumothorax. Stable cardiomegaly, diffuse edema versus airspace disease, and small pleural effusions These results were called by telephone at the time of interpretation on 09/01/2018 at 1:14 pm to Dr. Marda Stalker , who verbally acknowledged these results. Electronically Signed   By: Jerilynn Mages.  Shick M.D.   On: 09/01/2018 13:14   Dg Abd Portable 1v  Result Date: 09/01/2018 CLINICAL DATA:  OG tube placement. EXAM: PORTABLE ABDOMEN - 1 VIEW COMPARISON:  Abdominal radiograph dated 02/04/2016 FINDINGS: The OG tube enters the left mainstem bronchus and extends inferiorly, laterally and probably into the posterior left costophrenic sulcus. The patient has risk for pneumothorax after this tube is removed since it may be within the pleural space. Cardiomegaly. AICD in place. Patchy infiltrates at both lung bases which could represent pulmonary edema or pneumonia. There is suggestion of a central venous catheter with the tip at the level of the carina. IMPRESSION: 1. The  OG tube is in the left lung and possibly extends into the pleural space. Follow-up chest x-ray is recommended after tube removal since the patient is at risk for pneumothorax as described above. 2. Bilateral pulmonary infiltrates suggesting pulmonary edema or pneumonia. Critical Value/emergent results were called by telephone at the time of interpretation on 09/01/2018 at 12:51 pm to [place], RN , who verbally acknowledged these results. Negative. Electronically Signed   By: Lorriane Shire M.D.   On: 09/01/2018 12:54     Medications:   . ceFEPime (MAXIPIME) IV Stopped (09/02/18 0233)  . dextrose 50 mL/hr at 09/02/18 1200  . DOBUTamine 7.5 mcg/kg/min (09/02/18 1200)  . metronidazole Stopped (09/02/18 4782)  . norepinephrine (LEVOPHED) Adult infusion Stopped (09/01/18 1754)  . pantoprozole (PROTONIX) infusion 8 mg/hr (09/02/18 1200)  . vasopressin (PITRESSIN) infusion - *FOR SHOCK* 0.03 Units/min (09/02/18 1200)   . sodium chloride   Intravenous Once  . free water  100 mL Per Tube Q4H  . mouth rinse  15 mL Mouth Rinse BID  . [START ON Sep 11, 2018] pantoprazole  40 mg Intravenous Q12H  . sodium chloride flush  10-40 mL Intracatheter Q12H  . sodium chloride flush  3 mL Intravenous Q12H   albuterol, ondansetron **OR** ondansetron (ZOFRAN) IV, sodium chloride flush  Assessment/ Plan:  62 y.o. male with medical problems of schizophrenia, CVA, PVD, HTN, severe HF with EF 10% who presents now with acute respiratory failure, sepsis, acute renal failure, hypernatremia.   1.  Hypernatremia.  erum sodium high at 153.  We will start the patient on low-dose D5 W at 50 cc per hour.  Continue to periodically monitor serum sodium.  2.  Acute renal failure. BUN currently 96 with a creatinine of 2.5.  Urine output was only 445 cc of the preceding 24 hours. If family continues to desire aggressive care and we may need to consider renal replacement therapy however even his multiple comorbidities the patient  would not be a good long-term dialysis candidate.   3.  Acute respiratory failure.  Patient with significant shortness of breath at this time.  He is DNR status.  Continue supportive care for now.  4.  Hypotension/septic shock.  Patient currently on vasopressin.  Antibiotic management as per current Holter.   LOS: 2 Cynthea Zachman 10/24/20192:07 PM

## 2018-09-02 NOTE — Progress Notes (Signed)
Dr Soyla Murphy at bedside, states to try and switch out vasopressin with levophed as vasopressin is an add on.  Titrate for map of 65.  RN will restart levophed per MD and attempt to wean off vasopressin.

## 2018-09-02 NOTE — Consult Note (Signed)
[facility] Consultation Note  Patient ID: [name], MRN: [id number], DOB/AGE: 13-Mar-1956 62 y.o. Admit date: 08/10/2018   Date of Consult: 09/02/2018 Primary Physician: Remi Haggard, FNP Primary Cardiologist: Nehemiah Massed  Chief Complaint:  Chief Complaint  Patient presents with  . Fatigue   Reason for Consult: Heart failure  HPI: 62 y.o. male with known chronic systolic dysfunction congestive heart failure with ejection fraction of 15% by latest echocardiogram with chronic kidney disease stage IV anemia with hemoglobin of 7.9 diabetes and known abnormal EKG showing normal sinus rhythm with left bundle branch block.  The patient appears to have had a significant progression of shortness of breath edema and pulmonary edema causing a history of acute on chronic systolic dysfunction heart failure.  Patient is hypotensive and has had requirement of dobutamine for blood pressure support.  Therefore the patient is not able to use other medication management previously used including Entresto beta-blocker and diuretics.  The patient has had an issue in the past of concerns for dietary indiscretion but in addition to anemia and chronic kidney disease likely has a significant more poor prognosis at this time.  Currently there is no evidence of myocardial infarction  Past Medical History:  Diagnosis Date  . Anemia   . Anginal pain ([place])   . CHF (congestive heart failure) ([place])   . Collapse of lung 2018  . Decubitus skin ulcer since 2016-17   bilateral heels   . Depression   . Diabetes mellitus without complication ([place])   . Discontinued smoking   . Hypertension   . Metabolic encephalopathy 7989  . Peripheral vascular disease ([place])   . Renal failure (ARF), acute on chronic (HCC)   . Schizophrenia ([employer])   . Sepsis ([name]) 2017  . Stroke ([place])   . Thrombocythemia ([place]) 2017  . Thyroid disease       Surgical History:  Past Surgical History:  Procedure Laterality Date  .  PERIPHERAL VASCULAR CATHETERIZATION Left 05/26/2016   Procedure: Lower Extremity Angiography;  Surgeon: Algernon Huxley, MD;  Location: [facility];  Service: Cardiovascular;  Laterality: Left;  . PERIPHERAL VASCULAR CATHETERIZATION  05/26/2016   Procedure: Lower Extremity Intervention;  Surgeon: Algernon Huxley, MD;  Location: [facility];  Service: Cardiovascular;;  . PERIPHERAL VASCULAR CATHETERIZATION Right 06/05/2016   Procedure: Lower Extremity Angiography;  Surgeon: Algernon Huxley, MD;  Location: [facility];  Service: Cardiovascular;  Laterality: Right;  . periphervascular procedures       Home Meds: Prior to Admission medications   Medication Sig Start Date End Date Taking? Authorizing Provider  aspirin 81 MG chewable tablet Chew 1 tablet (81 mg total) by mouth daily. 03/10/18  Yes Dhungel, Nishant, MD  atorvastatin (LIPITOR) 10 MG tablet Take 1 tablet (10 mg total) by mouth daily. 05/26/16  Yes Dew, Erskine Squibb, MD  B Complex-C (B-COMPLEX WITH VITAMIN C) tablet Take 1 tablet by mouth daily.   Yes [provider]  clopidogrel (PLAVIX) 75 MG tablet Take 1 tablet (75 mg total) by mouth daily. 03/09/18 03/09/19 Yes Dhungel, Nishant, MD  docusate sodium (COLACE) 100 MG capsule Take 100 mg by mouth 2 (two) times daily.   Yes [provider]  ferrous sulfate 325 (65 FE) MG tablet Take 325 mg by mouth daily with breakfast.   Yes [provider]  folic acid (FOLVITE) 1 MG tablet Take 1 mg by mouth daily.   Yes [provider]  Infant Care  Products (DERMACLOUD) CREA Apply topically as needed (for pressure ulcers).   Yes [provider]  levETIRAcetam (KEPPRA) 500 MG tablet Take 1 tablet (500 mg total) by mouth 2 (two) times daily. 03/09/18  Yes Dhungel, Nishant, MD  midodrine (PROAMATINE) 5 MG tablet Take 1 tablet (5 mg total) by mouth 2 (two) times daily with a meal. 03/09/18  Yes Dhungel, Nishant, MD  sacubitril-valsartan (ENTRESTO) 24-26 MG Take  1 tablet by mouth 2 (two) times daily.   Yes [provider]  sertraline (ZOLOFT) 50 MG tablet Take 50 mg by mouth at bedtime.   Yes [provider]    Inpatient Medications:  . free water  100 mL Per Tube Q4H  . mouth rinse  15 mL Mouth Rinse BID  . [START ON Sep 26, 2018] pantoprazole  40 mg Intravenous Q12H  . sodium chloride flush  10-40 mL Intracatheter Q12H  . sodium chloride flush  3 mL Intravenous Q12H   . ceFEPime (MAXIPIME) IV Stopped (09/02/18 0233)  . dextrose 50 mL/hr at 09/02/18 1112  . DOBUTamine 7.5 mcg/kg/min (09/02/18 1000)  . metronidazole Stopped (09/02/18 8841)  . norepinephrine (LEVOPHED) Adult infusion Stopped (09/01/18 1754)  . pantoprozole (PROTONIX) infusion 8 mg/hr (09/02/18 1000)  . vasopressin (PITRESSIN) infusion - *FOR SHOCK* 0.03 Units/min (09/02/18 1000)    Allergies: No Known Allergies  Social History   Socioeconomic History  . Marital status: Single    Spouse name: Not on file  . Number of children: Not on file  . Years of education: Not on file  . Highest education level: Not on file  Occupational History  . Not on file  Social Needs  . Financial resource strain: Not on file  . Food insecurity:    Worry: Not on file    Inability: Not on file  . Transportation needs:    Medical: Not on file    Non-medical: Not on file  Tobacco Use  . Smoking status: Current Every Day Smoker    Packs/day: 1.00    Years: 30.00    Pack years: 30.00    Types: Cigarettes  . Smokeless tobacco: Never Used  Substance and Sexual Activity  . Alcohol use: No    Alcohol/week: 0.0 standard drinks    Comment:  pt unable to respond. Hx of alcohol abuse (per pt )  . Drug use: No    Comment: unknown pt UTA  . Sexual activity: Not Currently  Lifestyle  . Physical activity:    Days per week: Not on file    Minutes per session: Not on file  . Stress: Not on file  Relationships  . Social connections:    Talks on phone: Not on file    Gets  together: Not on file    Attends religious service: Not on file    Active member of club or organization: Not on file    Attends meetings of clubs or organizations: Not on file    Relationship status: Not on file  . Intimate partner violence:    Fear of current or ex partner: Not on file    Emotionally abused: Not on file    Physically abused: Not on file    Forced sexual activity: Not on file  Other Topics Concern  . Not on file  Social History Narrative  . Not on file     Family History  Family history unknown: Yes     Review of [employer] not able to converse at this time Labs:  Recent Labs    08/21/2018 1242  TROPONINI 0.06*   Lab Results  Component Value Date   WBC 8.0 09/02/2018   HGB 7.9 (L) 09/02/2018   HCT 23.1 (L) 09/02/2018   MCV 104.5 (H) 09/02/2018   PLT 56 (L) 09/02/2018    Recent Labs  Lab 09/02/18 0410  NA 153*  K 4.3  CL 122*  CO2 21*  BUN 96*  CREATININE 2.55*  CALCIUM 7.6*  PROT 5.6*  BILITOT 5.3*  ALKPHOS 217*  ALT 189*  AST 226*  GLUCOSE 106*   No results found for: CHOL, HDL, LDLCALC, TRIG Lab Results  Component Value Date   DDIMER 0.60 (H) 03/06/2018    Radiology/Studies:  Dg Chest Port 1 View  Result Date: 09/02/2018 CLINICAL DATA:  Left-sided pneumothorax EXAM: PORTABLE CHEST 1 VIEW COMPARISON:  September 01, 2018 FINDINGS: Left-sided pneumothorax is again noted. This pneumothorax is seen in the apical and medial aspects on the left but is no longer appreciable laterally. There is no tension component. There are pleural effusions bilaterally with patchy airspace consolidation in both lower lobes, more on the left than on the right. There is cardiomegaly with pulmonary vascularity normal. Central catheter tip is in the superior vena cava. 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 No evident adenopathy. No bone lesions. IMPRESSION: Persistent pneumothorax on the left without tension component. Bilateral lower lobe airspace  consolidation and pleural effusions present. Stable cardiomegaly. Central catheter tip in superior vena cava. Pacemaker lead attached to right ventricle. Electronically Signed   By: Lowella Grip III M.D.   On: 09/02/2018 09:47   Dg Chest Port 1 View  Result Date: 09/01/2018 CLINICAL DATA:  Pneumothorax EXAM: PORTABLE CHEST 1 VIEW COMPARISON:  None. FINDINGS: Left AICD remains in place, unchanged. Left lateral and apical pneumothorax again noted, likely stable. Cardiomegaly. Focal airspace opacities in the lower lobes are unchanged. IMPRESSION: Grossly stable left pneumothorax. Cardiomegaly. Bilateral lower lobe airspace opacities could reflect edema or pneumonia. Electronically Signed   By: Rolm Baptise M.D.   On: 09/01/2018 23:37   Dg Chest Port 1 View  Result Date: 09/01/2018 CLINICAL DATA:  Pneumothorax. EXAM: PORTABLE CHEST 1 VIEW COMPARISON:  Radiograph of September 01, 2018. FINDINGS: Stable cardiomegaly. Single lead left-sided pacemaker is unchanged in position. Right internal jugular catheter is unchanged in position. Grossly stable left pneumothorax is noted compared to prior exam. Mild bibasilar subsegmental atelectasis is noted. Bony thorax is unremarkable. IMPRESSION: Grossly stable left pneumothorax compared to prior exam. Electronically Signed   By: Marijo Conception, M.D.   On: 09/01/2018 16:34   Dg Chest Port 1 View  Result Date: 09/01/2018 CLINICAL DATA:  Pneumothorax suspected, interval removal of the malpositioned OG tube EXAM: PORTABLE CHEST 1 VIEW COMPARISON:  09/01/2018 FINDINGS: Small left pneumothorax with suspected apical and basilar components. Apical pleural separation measures 13 mm. Right IJ central line tip upper SVC level. Single lead left subclavian defibrillator noted. Heart is enlarged. Diffuse airspace disease versus edema again noted with basilar atelectasis. Small pleural effusions noted. IMPRESSION: Small left pneumothorax. Stable cardiomegaly with diffuse edema  versus airspace disease. Bibasilar atelectasis/partial consolidation and pleural effusions as well. These results will be called to the ordering clinician or representative by the Radiologist Assistant, and communication documented in the PACS or zVision Dashboard. Electronically Signed   By: Jerilynn Mages.  Shick M.D.   On: 09/01/2018 14:05   Dg Chest Port 1 View  Result Date: 09/01/2018 CLINICAL DATA:  Follow-up pneumonia. EXAM: PORTABLE  CHEST 1 VIEW COMPARISON:  09/01/2018 and older exams. FINDINGS: Cardiac silhouette is mildly enlarged. Right internal jugular central venous line is stable, tip in the mid superior vena cava. There are thickened bronchovascular markings with hazy intervening airspace opacities, the latter most of it in the lung bases. Small pleural effusions. These findings are similar to the most recent prior study, the represent a change from an exam dated 05/26/2018. No pneumothorax. Stable left anterior chest wall AICD. IMPRESSION: 1. Findings consistent with congestive heart failure interstitial and mild hazy airspace pulmonary edema and small effusions. Findings similar to most recent prior exam. Electronically Signed   By: Lajean Manes M.D.   On: 09/01/2018 12:31   Dg Chest Portable 1 View  Result Date: 09/06/2018 CLINICAL DATA:  Central line placement EXAM: PORTABLE CHEST 1 VIEW COMPARISON:  08/12/2018, 05/26/2018 FINDINGS: Left-sided pacing device as before. Right-sided central venous catheter tip over the SVC allowing for marked patient rotation. No right pneumothorax. Cardiomegaly with vascular congestion and mild edema. Atelectasis or infiltrate at the left base. Possible small pleural effusions. IMPRESSION: 1. Right-sided central venous catheter tip projects over expected location of SVC allowing for marked patient rotation. No pneumothorax 2. Cardiomegaly with small pleural effusions, vascular congestion and mild pulmonary edema. Atelectasis or pneumonia at the left base.  Electronically Signed   By: Donavan Foil M.D.   On: 08/28/2018 15:56   Dg Chest Portable 1 View  Result Date: 08/16/2018 CLINICAL DATA:  Pt is a resident at a group home in [place]. Staff stated that pt was more lethargic and swollen than usual and they were not able to get any vitals on the pt. Pt is normally non verbal, hypothermia EXAM: PORTABLE CHEST 1 VIEW COMPARISON:  05/26/2018 FINDINGS: There is a LEFT-sided transvenous pacemaker with leads to the RIGHT ventricle. The heart size is enlarged and stable. There is mild pulmonary vascular congestion and probable early interstitial edema. There is asymmetric density in the LEFT lung base, consistent with atelectasis or early infiltrate. IMPRESSION: Cardiomegaly and mild interstitial pulmonary edema. LEFT LOWER lobe atelectasis or early infiltrate. Electronically Signed   By: Nolon Nations M.D.   On: 09/03/2018 13:39   Dg Abd Portable 1v  Result Date: 09/01/2018 CLINICAL DATA:  Feeding tube placement EXAM: PORTABLE ABDOMEN - 1 VIEW COMPARISON:  09/01/2018 FINDINGS: Feeding tube extends within the left mainstem bronchus and into the left lower lobe projects over the left basilar pleural space. Right IJ central line tip upper SVC.  Defibrillator noted. Cardiomegaly evident with diffuse interstitial opacities and small pleural effusions edema is favored versus pneumonia. Degenerative changes of the spine. Nonobstructive bowel gas pattern. IMPRESSION: Feeding tube extends into the left lower lobe but projects over the pleural space. Therefore, patient at risk for pneumothorax. Stable cardiomegaly, diffuse edema versus airspace disease, and small pleural effusions These results were called by telephone at the time of interpretation on 09/01/2018 at 1:14 pm to Dr. Marda Stalker , who verbally acknowledged these results. Electronically Signed   By: Jerilynn Mages.  Shick M.D.   On: 09/01/2018 13:14   Dg Abd Portable 1v  Result Date: 09/01/2018 CLINICAL DATA:   OG tube placement. EXAM: PORTABLE ABDOMEN - 1 VIEW COMPARISON:  Abdominal radiograph dated 02/04/2016 FINDINGS: The OG tube enters the left mainstem bronchus and extends inferiorly, laterally and probably into the posterior left costophrenic sulcus. The patient has risk for pneumothorax after this tube is removed since it may be within the pleural space. Cardiomegaly. AICD in place.  Patchy infiltrates at both lung bases which could represent pulmonary edema or pneumonia. There is suggestion of a central venous catheter with the tip at the level of the carina. IMPRESSION: 1. The OG tube is in the left lung and possibly extends into the pleural space. Follow-up chest x-ray is recommended after tube removal since the patient is at risk for pneumothorax as described above. 2. Bilateral pulmonary infiltrates suggesting pulmonary edema or pneumonia. Critical Value/emergent results were called by telephone at the time of interpretation on 09/01/2018 at 12:51 pm to [place], RN , who verbally acknowledged these results. Negative. Electronically Signed   By: Lorriane Shire M.D.   On: 09/01/2018 12:54    EKG: Normal sinus rhythm with left bundle branch block  Weights: Filed Weights   08/26/2018 1800 09/01/18 0500 09/02/18 0404  Weight: 58.1 kg 61.2 kg 61.7 kg     Physical Exam: Blood pressure (!) 78/62, pulse 87, temperature 100 F (37.8 C), resp. rate (!) 32, height 5\' 7"  (1.702 m), weight 61.7 kg, SpO2 97 %. Body mass index is 21.3 kg/m. General: Well developed, well nourished, in no acute distress. Head eyes ears nose throat: Normocephalic, atraumatic, sclera non-icteric, no xanthomas, nares are without discharge. No apparent thyromegaly and/or mass  Lungs: Normal respiratory effort.  no wheezes, basilar rales, no rhonchi.  Heart: RRR with normal S1 S2. no murmur gallop, no rub, PMI is normal size and placement, carotid upstroke normal without bruit, jugular venous pressure is normal Abdomen: Soft,  non-tender, non-distended with normoactive bowel sounds. No hepatomegaly. No rebound/guarding. No obvious abdominal masses. Abdominal aorta is normal size without bruit Extremities: Trace edema. no cyanosis, no clubbing, no ulcers  Peripheral : 2+ bilateral upper extremity pulses, 2+ bilateral femoral pulses, 2+ bilateral dorsal pedal pulse Neuro: Not alert and oriented. No facial asymmetry. No focal deficit. Moves all extremities spontaneously. Musculoskeletal: Normal muscle tone without kyphosis Psych: Does not responds to questions appropriately with a normal affect.    Assessment: 62 year old male with severe dilated cardiomyopathy with acute on chronic systolic dysfunction heart failure with hypotension and abnormal EKG without evidence of myocardial infarction needing's pressure support  Plan: 1.  Pressure support with dobutamine as able for hypotension as well as poor cardiac output 2.  Abstain from other medication management including Entresto carvedilol due to hypotension and acute on chronic systolic dysfunction with hemodynamic compromise 3.  Diuresis as necessary for pulmonary edema 4.  Consider echocardiogram for evaluation of extent of myocardial heart failure 5.  Further treatment options after above  Signed, Corey Skains M.D. [facility] 09/02/2018, 12:50 PM

## 2018-09-02 NOTE — Progress Notes (Signed)
Daily Progress Note   Patient Name: [name]       Date: 09/02/2018 DOB: 20-May-1956  Age: 62 y.o. MRN#: [id number] Attending Physician: Max Sane, MD Primary Care Physician: Remi Haggard, FNP Admit Date: 08/28/2018  Reason for Consultation/Follow-up: Establishing goals of care  Subjective: Patient does not respond to me. No family at bedside.  Length of Stay: 2  Current Medications: Scheduled Meds:  . free water  100 mL Per Tube Q4H  . mouth rinse  15 mL Mouth Rinse BID  . [START ON 09-14-18] pantoprazole  40 mg Intravenous Q12H  . sodium chloride flush  10-40 mL Intracatheter Q12H  . sodium chloride flush  3 mL Intravenous Q12H    Continuous Infusions: . ceFEPime (MAXIPIME) IV Stopped (09/02/18 0233)  . dextrose    . DOBUTamine 7.5 mcg/kg/min (09/02/18 1000)  . metronidazole Stopped (09/02/18 0569)  . norepinephrine (LEVOPHED) Adult infusion Stopped (09/01/18 1754)  . pantoprozole (PROTONIX) infusion 8 mg/hr (09/02/18 1000)  . vasopressin (PITRESSIN) infusion - *FOR SHOCK* 0.03 Units/min (09/02/18 1000)    PRN Meds: albuterol, ondansetron **OR** ondansetron (ZOFRAN) IV, sodium chloride flush  Physical Exam         Constitutional: He appears lethargic. He has a sickly appearance. No distress.  Cardiovascular: Normal rate and regular rhythm.  Pulmonary/Chest: No accessory muscle usage. No respiratory distress.  Neurological: He appears lethargic. He is disoriented.  Non-verbal  Skin: Skin is warm and dry.  Psychiatric: Cognition and memory are impaired.   Vital Signs: BP (!) 82/68   Pulse 88   Temp 99 F (37.2 C)   Resp 10   Ht 5\' 7"  (1.702 m)   Wt 61.7 kg   SpO2 99%   BMI 21.30 kg/m  SpO2: SpO2: 99 % O2 Device: O2 Device: Nasal Cannula O2 Flow Rate: O2 Flow  Rate (L/min): 6 L/min  Intake/output summary:   Intake/Output Summary (Last 24 hours) at 09/02/2018 1049 Last data filed at 09/02/2018 1000 Gross per 24 hour  Intake 1497.68 ml  Output 445 ml  Net 1052.68 ml   LBM: Last BM Date: 09/01/18 Baseline Weight: Weight: 63.5 kg Most recent weight: Weight: 61.7 kg       Palliative Assessment/Data: PPS 20%    Flowsheet Rows     Most Recent Value  Intake Tab  Referral Department  Critical care  Unit at Time of Referral  ICU  Palliative Care Primary Diagnosis  Cardiac  Date Notified  09/01/18  Palliative Care Type  New Palliative care  Date of Admission  09/05/2018  Date first seen by Palliative Care  09/01/18  # of days Palliative referral response time  0 Day(s)  # of days IP prior to Palliative referral  1  Clinical Assessment  Palliative Performance Scale Score  20%  Psychosocial & Spiritual Assessment  Palliative Care Outcomes  Palliative Care Outcomes  Provided psychosocial or spiritual support, Counseled regarding hospice      Patient Active Problem List   Diagnosis Date Noted  . Pneumonia due to infectious organism   . Goals of care, counseling/discussion   . FTT (failure to thrive) in adult 05/26/2018  . CHF (congestive heart failure) ([place])   .  Dehydration, severe 05/25/2018  . Leukocytopenia 05/25/2018  . Bradycardia 05/25/2018  . Pressure injury of skin 05/08/2018  . ARF (acute renal failure) ([place]) 05/05/2018  . Hypotension 03/09/2018  . Pancytopenia ([place]) 03/06/2018  . Troponin level elevated 03/06/2018  . Schizophrenia ([place]) 03/06/2018  . History of stroke 03/06/2018  . PVD (peripheral vascular disease) ([place]) 03/06/2018  . Type 2 diabetes mellitus without complication ([place]) 07/62/2633  . Protein-calorie malnutrition, severe 01/16/2017  . Acute hypernatremia 01/14/2017  . Collapse of lung   . Mucus plugging of bronchi   . Acute on chronic respiratory failure ([place])   . Pressure ulcer 02/04/2016  . Right  lower lobe pneumonia ([place]) 02/01/2016  . Septic shock ([place]) 02/01/2016  . Hypernatremia 02/01/2016  . Acute renal failure (ARF) ([place]) 02/01/2016  . Lactic acidosis 02/01/2016  . Metabolic encephalopathy 35/45/6256  . Thrombocytopenia ([place]) 02/01/2016  . Anemia 02/01/2016  . Sepsis ([place]) 02/01/2016    Palliative Care Assessment & Plan   HPI: 62 y.o. male  with past medical history of CHF (EF 15%), CKD3, T2DM, schizophrenia, bipolar disorder, PAD, and recurrent admissions for dehydration admitted on 08/25/2018 with lethargy. Patient found to be hypotensive and anemic with a hgb of 6.6. Required blood transfusion. Also found to have lactic acid of 2.2 and BNP >4500. Elevated LFTs. Creatinine rising - now at 2.18. AMS thought to be due to toxic metabolic encephalopathy and baseline of schizophrenia. History of strokes. Diagnosed with septic shock and pna - requiring vasopressors. PMT consulted by critical care for [place].  Assessment: Recent events noted - worsening kidney failure, hgb dropping, issues with feeding, hypothermia, and hypotension.  Spoke with hospice liaison - patient received hospice services in 2017 for a couple of months; however, services were discontinued as hospice philosophy not in line with family's goals - they requested home health.   Attempted to speak to wife regarding poor prognosis - she was not at bedside today and did not call me back. Will continue to reach out to her to discuss poor prognosis and goals of care.   Of note, yesterday I offered a family meeting to the wife to include patient's children; however, she declined. She tells me this would be overwhelming to the children and she would like to be the one to make decisions for her husband.  Recommendations/Plan:  As of conversation 10/23  With patient's wife - continue current care. Wife declined family meeting  Attempts to discuss patient's poor prognosis with wife again; however, she has not returned my  calls - will continue to reach out to her  Code Status:  DNR  Prognosis:   Unable to determine  Discharge Planning:  To Be Determined  Care plan was discussed with RN  Thank you for allowing the Palliative Medicine Team to assist in the care of this patient.   Total Time 15 minutes Prolonged Time Billed  no       Greater than 50%  of this time was spent counseling and coordinating care related to the above assessment and plan.  Juel Burrow, DNP, [facility] Palliative Medicine Team Team Phone # [phone]  Pager [phone]

## 2018-09-02 NOTE — Progress Notes (Signed)
RN clarified with Dr Holley Raring that patient EF is 10% but MD ordered D5 at 99ml, Dr Holley Raring gave order to change D5 fluids to 75ml/hr.

## 2018-09-02 NOTE — Progress Notes (Signed)
Name: [name] MRN: [id number] DOB: Apr 07, 1956     CONSULTATION DATE: 09/09/2018  Subjective & objective: Remains on pressors levo fed + vasopressin + dobutamine + Protonix drip, map above 65, on nasal cannula.  PAST MEDICAL HISTORY :   has a past medical history of Anemia, Anginal pain ([place]), CHF (congestive heart failure) ([place]), Collapse of lung (2018), Decubitus skin ulcer (since 2016-17), Depression, Diabetes mellitus without complication ([place]), Discontinued smoking, Hypertension, Metabolic encephalopathy (4332), Peripheral vascular disease ([place]), Renal failure (ARF), acute on chronic ([place]), Schizophrenia ([place]), Sepsis ([place]) (2017), Stroke ([place]), Thrombocythemia ([place]) (2017), and Thyroid disease.  has a past surgical history that includes Cardiac catheterization (Left, 05/26/2016); Cardiac catheterization (05/26/2016); periphervascular procedures; and Cardiac catheterization (Right, 06/05/2016). Prior to Admission medications   Medication Sig Start Date End Date Taking? Authorizing Provider  aspirin 81 MG chewable tablet Chew 1 tablet (81 mg total) by mouth daily. 03/10/18  Yes Dhungel, Nishant, MD  atorvastatin (LIPITOR) 10 MG tablet Take 1 tablet (10 mg total) by mouth daily. 05/26/16  Yes Dew, Erskine Squibb, MD  B Complex-C (B-COMPLEX WITH VITAMIN C) tablet Take 1 tablet by mouth daily.   Yes [provider]  clopidogrel (PLAVIX) 75 MG tablet Take 1 tablet (75 mg total) by mouth daily. 03/09/18 03/09/19 Yes Dhungel, Nishant, MD  docusate sodium (COLACE) 100 MG capsule Take 100 mg by mouth 2 (two) times daily.   Yes [provider]  ferrous sulfate 325 (65 FE) MG tablet Take 325 mg by mouth daily with breakfast.   Yes [provider]  folic acid (FOLVITE) 1 MG tablet Take 1 mg by mouth daily.   Yes [provider]  [place] (DERMACLOUD) CREA Apply topically as needed (for pressure ulcers).   Yes [provider]  levETIRAcetam (KEPPRA) 500  MG tablet Take 1 tablet (500 mg total) by mouth 2 (two) times daily. 03/09/18  Yes Dhungel, Nishant, MD  midodrine (PROAMATINE) 5 MG tablet Take 1 tablet (5 mg total) by mouth 2 (two) times daily with a meal. 03/09/18  Yes Dhungel, Nishant, MD  sacubitril-valsartan (ENTRESTO) 24-26 MG Take 1 tablet by mouth 2 (two) times daily.   Yes [provider]  sertraline (ZOLOFT) 50 MG tablet Take 50 mg by mouth at bedtime.   Yes [provider]   No Known Allergies  FAMILY HISTORY:  Family history is unknown by patient. SOCIAL HISTORY:  reports that he has been smoking cigarettes. He has a 30.00 pack-year smoking history. He has never used smokeless tobacco. He reports that he does not drink alcohol or use drugs.  REVIEW OF SYSTEMS:   Unable to obtain due to critical illness   VITAL SIGNS: Temp:  [95.7 F (35.4 C)-100 F (37.8 C)] 100 F (37.8 C) (10/24 1200) Pulse Rate:  [74-89] 87 (10/24 1200) Resp:  [10-32] 32 (10/24 1200) BP: (77-95)/(62-78) 78/62 (10/24 1200) SpO2:  [91 %-100 %] 97 % (10/24 1200) Weight:  [61.7 kg] 61.7 kg (10/24 0404)  Physical Examination: Obtunded, responds to verbal stimulation by eye opening. Doesn't track and not following commands [place], no distress, AE on Lt. < Rt. And no rales S1 & S2 audible and no murmur Benign abdomen with feeble peristalses Wasted extremities and no edema   ASSESSMENT / PLAN:  Acute respiratory failure. Tolerating [place]. DNR -Monitor ABG and optimize O2 therapy  Altered mental status with unresponsiveness due to toxic metabolic encephalopathy. Base line schizophrenia and encephalopathy. CT head 04/2018 [place] Lt. Post  parietal and watershed and old Lt. Central lacunar infarct. -Monitor neuro status -CT head when hemodynamically stable  Septic shock / cardiogenic etiology  -Optimize volume, pressers and monitor hemodynamics. -d/c Vanc (MRSA PCR -ve) + c/w Cefe -Monitor Procalcitonin and cultures.  Acute on  chronic HFrEF. ECHO 09/01/2018 LVEF 20-25% with grade II diastolic dysfunction.  h/o dilated cardiomyopathy and EF of 15% on ECHO 02/2018 s/p AICD. On Levophed + Vasopressin + Dobutamin  -Optimize volume and diuresis -Follow with cardiology  Pneumonia and atelectasis. Improved Lt. L airspace disease -Cefep + Flagyl -Monitor CXR + CBC + FIO2  Iatrogenic Lt. Pneumothorax after attempted placement of DHT (improved) -Monitor CXR  Metabolic acidosis (improved) with lactic acidemia -Off Bicarb. gtt -Monitor renal panel, PH and LA  AKI and hypernatremia (worsening) with ATN, intravascular volume depletion and hypoperfusion -Optimize hemodynamics, avoid nephrotoxins, monitor renal panel and urine out put. -Renal U/S to r/o obstructive uropathy if no improvement -Follow with renal consult  UTI -ABX as above and follow with cultures.  PAD on ASA + Plavix. No acute ischemia -Keep antiplat on hold  Elevated LFT with possible acute ischemic hepatitis -Optimize hemodynamics. Avoid hepatotoxins and monitor LFTs. -Follow with GI  Anemia, HB drop requiring transfusion with no obvious bleeding. Concern for GI bleeding -PPI, off antiplat as advised by GI  -Monitor HB and keep > 8 gm/dl -GI is following and advised hemodynamic stability and bleeding scan if HB continued to drop.  Thrombocytopenia -Monitor Plat and keep > 50000.  Coagulopathy -FFP transfusion to keep INR less than 1.5  DNR  DVT & GI prophylaxis. Continue with supportive care  Critical care time 40 min

## 2018-09-03 ENCOUNTER — Inpatient Hospital Stay: Payer: Medicare Other

## 2018-09-03 DIAGNOSIS — R4182 Altered mental status, unspecified: Secondary | ICD-10-CM

## 2018-09-03 LAB — PREPARE FRESH FROZEN PLASMA: Unit division: 0

## 2018-09-03 LAB — COMPREHENSIVE METABOLIC PANEL
ALBUMIN: 3.3 g/dL — AB (ref 3.5–5.0)
ALK PHOS: 189 U/L — AB (ref 38–126)
ALT: 201 U/L — ABNORMAL HIGH (ref 0–44)
ANION GAP: 13 (ref 5–15)
AST: 213 U/L — ABNORMAL HIGH (ref 15–41)
BUN: 101 mg/dL — AB (ref 6–20)
CHLORIDE: 122 mmol/L — AB (ref 98–111)
CO2: 18 mmol/L — ABNORMAL LOW (ref 22–32)
CREATININE: 2.86 mg/dL — AB (ref 0.61–1.24)
Calcium: 7.4 mg/dL — ABNORMAL LOW (ref 8.9–10.3)
GFR calc Af Amer: 26 mL/min — ABNORMAL LOW (ref >60)
GFR calc non Af Amer: 22 mL/min — ABNORMAL LOW (ref >60)
GLUCOSE: 169 mg/dL — AB (ref 70–99)
Potassium: 4.3 mmol/L (ref 3.5–5.1)
SODIUM: 153 mmol/L — AB (ref 135–145)
Total Bilirubin: 7.6 mg/dL — ABNORMAL HIGH (ref 0.3–1.2)
Total Protein: 5.5 g/dL — ABNORMAL LOW (ref 6.5–8.1)

## 2018-09-03 LAB — CBC WITH DIFFERENTIAL/PLATELET
BASOS PCT: 0 %
Basophils Absolute: 0 10*3/uL (ref 0.0–0.1)
Eosinophils Absolute: 0 10*3/uL (ref 0.0–0.5)
Eosinophils Relative: 0 %
HCT: 24.1 % — ABNORMAL LOW (ref 39.0–52.0)
Hemoglobin: 8.1 g/dL — ABNORMAL LOW (ref 13.0–17.0)
LYMPHS PCT: 4 %
Lymphs Abs: 0.4 10*3/uL — ABNORMAL LOW (ref 0.7–4.0)
MCH: 35.7 pg — AB (ref 26.0–34.0)
MCHC: 33.6 g/dL (ref 30.0–36.0)
MCV: 106.2 fL — ABNORMAL HIGH (ref 80.0–100.0)
MONOS PCT: 2 %
Monocytes Absolute: 0.2 10*3/uL (ref 0.1–1.0)
NEUTROS ABS: 8.5 10*3/uL — AB (ref 1.7–7.7)
NEUTROS PCT: 94 %
PLATELETS: 101 10*3/uL — AB (ref 150–400)
RBC: 2.27 MIL/uL — ABNORMAL LOW (ref 4.22–5.81)
RDW: 22.2 % — ABNORMAL HIGH (ref 11.5–15.5)
WBC: 9 10*3/uL (ref 4.0–10.5)
nRBC: 14.8 % — ABNORMAL HIGH (ref 0.0–0.2)
nRBC: 24 /100 WBC — ABNORMAL HIGH

## 2018-09-03 LAB — BLOOD GAS, ARTERIAL
ACID-BASE DEFICIT: 8.1 mmol/L — AB (ref 0.0–2.0)
BICARBONATE: 16.4 mmol/L — AB (ref 20.0–28.0)
FIO2: 0.44
O2 Saturation: 94.9 %
PATIENT TEMPERATURE: 37
pCO2 arterial: 29 mmHg — ABNORMAL LOW (ref 32.0–48.0)
pH, Arterial: 7.36 (ref 7.350–7.450)
pO2, Arterial: 78 mmHg — ABNORMAL LOW (ref 83.0–108.0)

## 2018-09-03 LAB — BLOOD GAS, VENOUS
Acid-base deficit: 9.7 mmol/L — ABNORMAL HIGH (ref 0.0–2.0)
BICARBONATE: 16.6 mmol/L — AB (ref 20.0–28.0)
PATIENT TEMPERATURE: 37
PH VEN: 7.26 (ref 7.250–7.430)
pCO2, Ven: 37 mmHg — ABNORMAL LOW (ref 44.0–60.0)

## 2018-09-03 LAB — BPAM FFP
Blood Product Expiration Date: 201910292359
ISSUE DATE / TIME: 201910242146
Unit Type and Rh: 6200

## 2018-09-03 LAB — CALCIUM, IONIZED: CALCIUM, IONIZED, SERUM: 4.1 mg/dL — AB (ref 4.5–5.6)

## 2018-09-03 LAB — HEMOGLOBIN AND HEMATOCRIT, BLOOD
HEMATOCRIT: 23.3 % — AB (ref 39.0–52.0)
Hemoglobin: 7.8 g/dL — ABNORMAL LOW (ref 13.0–17.0)

## 2018-09-03 LAB — MAGNESIUM: Magnesium: 2.5 mg/dL — ABNORMAL HIGH (ref 1.7–2.4)

## 2018-09-03 LAB — PHOSPHORUS: Phosphorus: 7.7 mg/dL — ABNORMAL HIGH (ref 2.5–4.6)

## 2018-09-03 LAB — AMMONIA: Ammonia: 37 umol/L — ABNORMAL HIGH (ref 9–35)

## 2018-09-03 MED ORDER — LORAZEPAM 1 MG PO TABS: 1.0000 mg | ORAL_TABLET | ORAL | Status: DC | PRN

## 2018-09-03 MED ORDER — HYDROMORPHONE HCL 1 MG/ML IJ SOLN: 0.5000 mg | INTRAMUSCULAR | Status: DC | PRN

## 2018-09-03 MED ORDER — GLYCOPYRROLATE 0.2 MG/ML IJ SOLN
0.2000 mg | INTRAMUSCULAR | Status: DC | PRN
  Administered 2018-09-03: 0.2 mg via INTRAVENOUS
  Filled 2018-09-03: qty 1

## 2018-09-03 MED ORDER — HALOPERIDOL LACTATE 2 MG/ML PO CONC
0.5000 mg | ORAL | Status: DC | PRN
  Filled 2018-09-03: qty 0.3

## 2018-09-03 MED ORDER — HYDROMORPHONE HCL 1 MG/ML IJ SOLN
0.5000 mg | INTRAMUSCULAR | Status: AC
  Administered 2018-09-03: 0.5 mg via INTRAVENOUS
  Filled 2018-09-03: qty 1

## 2018-09-03 MED ORDER — DEXTROSE 10 % IV SOLN
INTRAVENOUS | Status: DC
  Administered 2018-09-03: 09:00:00 via INTRAVENOUS

## 2018-09-03 MED ORDER — ORAL CARE MOUTH RINSE: 15.0000 mL | Freq: Two times a day (BID) | OROMUCOSAL | Status: DC

## 2018-09-03 MED ORDER — HALOPERIDOL 0.5 MG PO TABS
0.5000 mg | ORAL_TABLET | ORAL | Status: DC | PRN
  Filled 2018-09-03: qty 1

## 2018-09-03 MED ORDER — LORAZEPAM 2 MG/ML IJ SOLN: 1.0000 mg | INTRAMUSCULAR | Status: DC | PRN

## 2018-09-03 MED ORDER — CHLORHEXIDINE GLUCONATE 0.12 % MT SOLN
15.0000 mL | Freq: Two times a day (BID) | OROMUCOSAL | Status: DC
  Administered 2018-09-03: 15 mL via OROMUCOSAL

## 2018-09-03 MED ORDER — SODIUM CHLORIDE 0.9 % IV SOLN
0.5000 mg/h | INTRAVENOUS | Status: DC
  Administered 2018-09-03: 0.5 mg/h via INTRAVENOUS
  Filled 2018-09-03: qty 2.5

## 2018-09-03 MED ORDER — HALOPERIDOL LACTATE 5 MG/ML IJ SOLN: 0.5000 mg | INTRAMUSCULAR | Status: DC | PRN

## 2018-09-03 MED ORDER — LORAZEPAM 2 MG/ML PO CONC
1.0000 mg | ORAL | Status: DC | PRN
  Filled 2018-09-03: qty 0.5

## 2018-09-03 MED ORDER — HYDROMORPHONE BOLUS VIA INFUSION
0.5000 mg | INTRAVENOUS | Status: DC | PRN
  Filled 2018-09-03: qty 1

## 2018-09-03 MED ORDER — GLYCOPYRROLATE 0.2 MG/ML IJ SOLN
0.4000 mg | INTRAMUSCULAR | Status: AC
  Administered 2018-09-03: 0.4 mg via INTRAVENOUS
  Filled 2018-09-03: qty 2

## 2018-09-03 NOTE — Progress Notes (Signed)
[facility] Encounter Note  Patient: [name] / Admit Date: 08/30/2018 / Date of Encounter: 09/03/2018, 8:30 AM   Subjective: Patient still significantly disease with congestive heart failure and hypotension with respiratory distress despite appropriate medication management and supportive care.  No evidence of myocardial infarction.  Patient is continuing to have worsening chronic kidney disease and acute renal failure due to congestive heart failure Cardiogram without change in LV function with severe dilation of left ventricle and ejection fraction less than 20% Moderate mitral and tricuspid regurgitation  Review of Systems: Not assessed due to obtundation Objective: Telemetry: Normal sinus rhythm Physical Exam: Blood pressure (!) 77/65, pulse 90, temperature 98.6 F (37 C), resp. rate (!) 31, height 5\' 7"  (1.702 m), weight 62.8 kg, SpO2 100 %. Body mass index is 21.68 kg/m. General: Poorly developed, poorly nourished, in   acute distress. Head: Normocephalic, atraumatic, sclera non-icteric, no xanthomas, nares are without discharge. Neck: No apparent masses Lungs: Normal respirations with diffuse wheezes, some rhonchi, no rales , basilar crackles   Heart: Regular rate and rhythm, normal S1 S2, apical murmur, no rub, no gallop, PMI is normal size and placement, carotid upstroke normal without bruit, jugular venous pressure normal Abdomen: Soft, non-tender, non-distended with normoactive bowel sounds. No hepatosplenomegaly. Abdominal aorta is normal size without bruit Extremities: 1+ edema, no clubbing, no cyanosis, no ulcers,  Peripheral: 2+ radial, 2+ femoral, 2+ dorsal pedal pulses Neuro: Not alert and oriented. Moves all extremities spontaneously. Psych: Does not responds to questions appropriately with a normal affect.   Intake/Output Summary (Last 24 hours) at 09/03/2018 0830 Last data filed at 09/03/2018 0807 Gross per 24 hour  Intake 3188.43 ml   Output 597 ml  Net 2591.43 ml    Inpatient Medications:  . chlorhexidine  15 mL Mouth Rinse BID  . free water  100 mL Per Tube Q4H  . mouth rinse  15 mL Mouth Rinse BID  . mouth rinse  15 mL Mouth Rinse q12n4p  . [START ON 09/27/2018] pantoprazole  40 mg Intravenous Q12H  . sodium chloride flush  10-40 mL Intracatheter Q12H  . sodium chloride flush  3 mL Intravenous Q12H   Infusions:  . ceFEPime (MAXIPIME) IV 2 g (09/03/18 0235)  . dextrose 50 mL/hr at 09/02/18 1900  . DOBUTamine 7.5 mcg/kg/min (09/02/18 1900)  . metronidazole 500 mg (09/03/18 0610)  . norepinephrine (LEVOPHED) Adult infusion 20 mcg/min (09/03/18 0759)  . pantoprozole (PROTONIX) infusion 8 mg/hr (09/03/18 0228)  . vasopressin (PITRESSIN) infusion - *FOR SHOCK* 0.03 Units/min (09/03/18 0010)    Labs: Recent Labs    09/02/18 0410 09/03/18 0503  NA 153* 153*  K 4.3 4.3  CL 122* 122*  CO2 21* 18*  GLUCOSE 106* 169*  BUN 96* 101*  CREATININE 2.55* 2.86*  CALCIUM 7.6* 7.4*  MG 2.4 2.5*  PHOS 7.3* 7.7*   Recent Labs    09/02/18 0410 09/03/18 0503  AST 226* 213*  ALT 189* 201*  ALKPHOS 217* 189*  BILITOT 5.3* 7.6*  PROT 5.6* 5.5*  ALBUMIN 3.2* 3.3*   Recent Labs    09/02/18 0410  09/02/18 2350 09/03/18 0503  WBC 8.0  --   --  9.0  NEUTROABS 6.8  --   --  8.5*  HGB 7.9*   < > 7.8* 8.1*  HCT 23.1*   < > 23.3* 24.1*  MCV 104.5*  --   --  106.2*  PLT 56*  --   --  101*   < > =  values in this interval not displayed.   Recent Labs    08/27/2018 1242  TROPONINI 0.06*   Invalid input(s): POCBNP No results for input(s): HGBA1C in the last 72 hours.   Weights: Filed Weights   09/01/18 0500 09/02/18 0404 09/03/18 0500  Weight: 61.2 kg 61.7 kg 62.8 kg     Radiology/Studies:  Dg Abd 1 View  Result Date: 09/02/2018 CLINICAL DATA:  Evaluate NG tube placement EXAM: ABDOMEN - 1 VIEW COMPARISON:  September 02, 2017 FINDINGS: The NG tube appears to coil in the stomach with the distal tip in the  fundus. Decreasing opacity in the right lung base. No other change. IMPRESSION: The NG tube appears to be in good position, likely terminating in the gastric fundus. Improving right basilar infiltrate. Electronically Signed   By: Dorise Bullion III M.D   On: 09/02/2018 21:58   Dg Abd 1 View  Result Date: 09/02/2018 CLINICAL DATA:  NG tube placement EXAM: ABDOMEN - 1 VIEW COMPARISON:  09/02/2018, 09/01/2018, 02/05/2016 FINDINGS: Esophageal tube tip projects over the distal esophageal region. Dense airspace disease within the bilateral lung bases with some worsening on the right. Cardiomegaly. IMPRESSION: 1. Esophageal tube tip overlies the distal esophagus 2. Cardiomegaly. Dense bibasilar airspace disease with suspected worsening at the right base. Electronically Signed   By: Donavan Foil M.D.   On: 09/02/2018 20:12   Dg Abd 1 View  Result Date: 09/02/2018 CLINICAL DATA:  NG tube placement. EXAM: ABDOMEN - 1 VIEW COMPARISON:  None. FINDINGS: An NG tube is identified with tip overlying the distal esophagus-recommend advancement. Gas-filled loops of bowel are again noted. IMPRESSION: NG tube with tip overlying the distal esophagus-recommend advancement. Electronically Signed   By: Margarette Canada M.D.   On: 09/02/2018 18:36   Dg Chest Port 1 View  Result Date: 09/02/2018 CLINICAL DATA:  Left-sided pneumothorax EXAM: PORTABLE CHEST 1 VIEW COMPARISON:  September 01, 2018 FINDINGS: Left-sided pneumothorax is again noted. This pneumothorax is seen in the apical and medial aspects on the left but is no longer appreciable laterally. There is no tension component. There are pleural effusions bilaterally with patchy airspace consolidation in both lower lobes, more on the left than on the right. There is cardiomegaly with pulmonary vascularity normal. Central catheter tip is in the superior vena cava. 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 No evident adenopathy. No bone lesions. IMPRESSION: Persistent  pneumothorax on the left without tension component. Bilateral lower lobe airspace consolidation and pleural effusions present. Stable cardiomegaly. Central catheter tip in superior vena cava. Pacemaker lead attached to right ventricle. Electronically Signed   By: Lowella Grip III M.D.   On: 09/02/2018 09:47   Dg Chest Port 1 View  Result Date: 09/01/2018 CLINICAL DATA:  Pneumothorax EXAM: PORTABLE CHEST 1 VIEW COMPARISON:  None. FINDINGS: Left AICD remains in place, unchanged. Left lateral and apical pneumothorax again noted, likely stable. Cardiomegaly. Focal airspace opacities in the lower lobes are unchanged. IMPRESSION: Grossly stable left pneumothorax. Cardiomegaly. Bilateral lower lobe airspace opacities could reflect edema or pneumonia. Electronically Signed   By: Rolm Baptise M.D.   On: 09/01/2018 23:37   Dg Chest Port 1 View  Result Date: 09/01/2018 CLINICAL DATA:  Pneumothorax. EXAM: PORTABLE CHEST 1 VIEW COMPARISON:  Radiograph of September 01, 2018. FINDINGS: Stable cardiomegaly. Single lead left-sided pacemaker is unchanged in position. Right internal jugular catheter is unchanged in position. Grossly stable left pneumothorax is noted compared to prior exam. Mild bibasilar subsegmental atelectasis is noted.  Bony thorax is unremarkable. IMPRESSION: Grossly stable left pneumothorax compared to prior exam. Electronically Signed   By: Marijo Conception, M.D.   On: 09/01/2018 16:34   Dg Chest Port 1 View  Result Date: 09/01/2018 CLINICAL DATA:  Pneumothorax suspected, interval removal of the malpositioned OG tube EXAM: PORTABLE CHEST 1 VIEW COMPARISON:  09/01/2018 FINDINGS: Small left pneumothorax with suspected apical and basilar components. Apical pleural separation measures 13 mm. Right IJ central line tip upper SVC level. Single lead left subclavian defibrillator noted. Heart is enlarged. Diffuse airspace disease versus edema again noted with basilar atelectasis. Small pleural effusions  noted. IMPRESSION: Small left pneumothorax. Stable cardiomegaly with diffuse edema versus airspace disease. Bibasilar atelectasis/partial consolidation and pleural effusions as well. These results will be called to the ordering clinician or representative by the Radiologist Assistant, and communication documented in the PACS or zVision Dashboard. Electronically Signed   By: Jerilynn Mages.  Shick M.D.   On: 09/01/2018 14:05   Dg Chest Port 1 View  Result Date: 09/01/2018 CLINICAL DATA:  Follow-up pneumonia. EXAM: PORTABLE CHEST 1 VIEW COMPARISON:  09/07/2018 and older exams. FINDINGS: Cardiac silhouette is mildly enlarged. Right internal jugular central venous line is stable, tip in the mid superior vena cava. There are thickened bronchovascular markings with hazy intervening airspace opacities, the latter most of it in the lung bases. Small pleural effusions. These findings are similar to the most recent prior study, the represent a change from an exam dated 05/26/2018. No pneumothorax. Stable left anterior chest wall AICD. IMPRESSION: 1. Findings consistent with congestive heart failure interstitial and mild hazy airspace pulmonary edema and small effusions. Findings similar to most recent prior exam. Electronically Signed   By: Lajean Manes M.D.   On: 09/01/2018 12:31   Dg Chest Portable 1 View  Result Date: 08/11/2018 CLINICAL DATA:  Central line placement EXAM: PORTABLE CHEST 1 VIEW COMPARISON:  08/24/2018, 05/26/2018 FINDINGS: Left-sided pacing device as before. Right-sided central venous catheter tip over the SVC allowing for marked patient rotation. No right pneumothorax. Cardiomegaly with vascular congestion and mild edema. Atelectasis or infiltrate at the left base. Possible small pleural effusions. IMPRESSION: 1. Right-sided central venous catheter tip projects over expected location of SVC allowing for marked patient rotation. No pneumothorax 2. Cardiomegaly with small pleural effusions, vascular  congestion and mild pulmonary edema. Atelectasis or pneumonia at the left base. Electronically Signed   By: Donavan Foil M.D.   On: 08/26/2018 15:56   Dg Chest Portable 1 View  Result Date: 09/03/2018 CLINICAL DATA:  Pt is a resident at a group home in [place]. Staff stated that pt was more lethargic and swollen than usual and they were not able to get any vitals on the pt. Pt is normally non verbal, hypothermia EXAM: PORTABLE CHEST 1 VIEW COMPARISON:  05/26/2018 FINDINGS: There is a LEFT-sided transvenous pacemaker with leads to the RIGHT ventricle. The heart size is enlarged and stable. There is mild pulmonary vascular congestion and probable early interstitial edema. There is asymmetric density in the LEFT lung base, consistent with atelectasis or early infiltrate. IMPRESSION: Cardiomegaly and mild interstitial pulmonary edema. LEFT LOWER lobe atelectasis or early infiltrate. Electronically Signed   By: Nolon Nations M.D.   On: 08/12/2018 13:39   Dg Abd Portable 1v  Result Date: 09/01/2018 CLINICAL DATA:  Feeding tube placement EXAM: PORTABLE ABDOMEN - 1 VIEW COMPARISON:  09/01/2018 FINDINGS: Feeding tube extends within the left mainstem bronchus and into the left lower lobe projects over the  left basilar pleural space. Right IJ central line tip upper SVC.  Defibrillator noted. Cardiomegaly evident with diffuse interstitial opacities and small pleural effusions edema is favored versus pneumonia. Degenerative changes of the spine. Nonobstructive bowel gas pattern. IMPRESSION: Feeding tube extends into the left lower lobe but projects over the pleural space. Therefore, patient at risk for pneumothorax. Stable cardiomegaly, diffuse edema versus airspace disease, and small pleural effusions These results were called by telephone at the time of interpretation on 09/01/2018 at 1:14 pm to Dr. Marda Stalker , who verbally acknowledged these results. Electronically Signed   By: Jerilynn Mages.  Shick M.D.   On:  09/01/2018 13:14   Dg Abd Portable 1v  Result Date: 09/01/2018 CLINICAL DATA:  OG tube placement. EXAM: PORTABLE ABDOMEN - 1 VIEW COMPARISON:  Abdominal radiograph dated 02/04/2016 FINDINGS: The OG tube enters the left mainstem bronchus and extends inferiorly, laterally and probably into the posterior left costophrenic sulcus. The patient has risk for pneumothorax after this tube is removed since it may be within the pleural space. Cardiomegaly. AICD in place. Patchy infiltrates at both lung bases which could represent pulmonary edema or pneumonia. There is suggestion of a central venous catheter with the tip at the level of the carina. IMPRESSION: 1. The OG tube is in the left lung and possibly extends into the pleural space. Follow-up chest x-ray is recommended after tube removal since the patient is at risk for pneumothorax as described above. 2. Bilateral pulmonary infiltrates suggesting pulmonary edema or pneumonia. Critical Value/emergent results were called by telephone at the time of interpretation on 09/01/2018 at 12:51 pm to [place], RN , who verbally acknowledged these results. Negative. Electronically Signed   By: Lorriane Shire M.D.   On: 09/01/2018 12:54     Assessment and Recommendation  62 y.o. male with a severe LV systolic dysfunction with acute on chronic systolic dysfunction congestive heart failure acute renal failure pulmonary edema and hypotension not responding well to supportive care without evidence of myocardial infarction 1.  Continue supportive care including dobutamine for further risk reduction in hypotension and heart failure 2.  Gentle diuresis as able with aggressive oxygenation for congestive heart failure and acute renal failure 3.  Abstain from medication management for previous heart failure due to hypotension 4.  No further cardiac diagnostics necessary at this time 5.  Continue working with pulmonary toilet for reduction of pneumonia aspiration and follow for  need for change in medication management as above  Signed, Serafina Royals M.D. FACC

## 2018-09-03 NOTE — Progress Notes (Signed)
[name] , MD [address] [place], [place], [place], [address] [address], [place], [place], [place], [address] Phone: [phone]  Fax: [phone]   [name] is being followed for anemia    Subjective: Appears very, labored breathing , not able to communicate    Objective: Vital signs in last 24 hours: Vitals:   09/03/18 0900 09/03/18 0915 09/03/18 0930 09/03/18 0945  BP: (!) 79/65 (!) 80/69 (!) 75/66 (!) 79/62  Pulse: 90 90 86 93  Resp: (!) 24 (!) 26 (!) 23 (!) 36  Temp: 98.4 F (36.9 C) 98.4 F (36.9 C) 98.4 F (36.9 C) 98.4 F (36.9 C)  TempSrc:      SpO2: 96% 96% 97% 99%  Weight:      Height:       Weight change: 1.1 kg  Intake/Output Summary (Last 24 hours) at 09/03/2018 1039 Last data filed at 09/03/2018 8250 Gross per 24 hour  Intake 3173.81 ml  Output 597 ml  Net 2576.81 ml     Exam: Heart:: Regular rate and rhythm, S1S2 present or without murmur or extra heart sounds Lungs: expiratory wheezes and bibasilar rales Abdomen: soft, nontender, normal bowel sounds   Lab Results: @LABTEST2 @ Micro Results: Recent Results (from the past 240 hour(s))  Blood culture (routine x 2)     Status: None (Preliminary result)   Collection Time: 08/28/2018 12:44 PM  Result Value Ref [place] Status   Specimen Description BLOOD RAC  Final   Special Requests   Final    BOTTLES DRAWN AEROBIC AND ANAEROBIC Blood Culture adequate volume   Culture   Final    NO GROWTH 3 DAYS Performed at [facility], [address], [place], [place] [address]    Report Status PENDING  Incomplete  Urine culture     Status: None   Collection Time: 08/19/2018 12:44 PM  Result Value Ref Range Status   Specimen Description   Final    URINE, RANDOM Performed at [facility], [address], [place], [place] [address]    Special Requests   Final    NONE Performed at [facility], [address], [place], [place] [address]    Culture   Final      NO GROWTH Performed at [facility], [place] [address], [place], [place] [address]    Report Status 09/01/2018 FINAL  Final  Blood culture (routine x 2)     Status: None (Preliminary result)   Collection Time: 09/07/2018 12:49 PM  Result Value Ref Range Status   Specimen Description BLOOD LFA  Final   Special Requests   Final    BOTTLES DRAWN AEROBIC AND ANAEROBIC Blood Culture results may not be optimal due to an inadequate volume of blood received in culture bottles   Culture   Final    NO GROWTH 3 DAYS Performed at [facility], [address] [place],  [address]    Report Status PENDING  Incomplete  MRSA PCR Screening     Status: None   Collection Time: 08/26/2018  6:06 PM  Result Value Ref Range Status   MRSA by PCR NEGATIVE NEGATIVE Final    Comment:        The GeneXpert MRSA Assay (FDA approved for NASAL specimens only), is one component of a comprehensive MRSA colonization surveillance program. It is not intended to diagnose MRSA infection nor to guide or monitor treatment for MRSA infections. Performed at [facility], [phone]  [address] [place], [place] [address]    Studies/Results: Dg Abd 1 View  Result Date: 09/02/2018 CLINICAL DATA:  Evaluate NG tube placement EXAM: ABDOMEN - 1 VIEW COMPARISON:  September 02, 2017 FINDINGS: The NG tube appears to coil in the stomach with the distal tip in the fundus. Decreasing opacity in the right lung base. No other change. IMPRESSION: The NG tube appears to be in good position, likely terminating in the gastric fundus. Improving right basilar infiltrate. Electronically Signed   By: Dorise Bullion III M.D   On: 09/02/2018 21:58   Dg Abd 1 View  Result Date: 09/02/2018 CLINICAL DATA:  NG tube placement EXAM: ABDOMEN - 1 VIEW COMPARISON:  09/02/2018, 09/01/2018, 02/05/2016 FINDINGS: Esophageal tube tip projects over the distal esophageal region. Dense airspace disease within the bilateral lung bases  with some worsening on the right. Cardiomegaly. IMPRESSION: 1. Esophageal tube tip overlies the distal esophagus 2. Cardiomegaly. Dense bibasilar airspace disease with suspected worsening at the right base. Electronically Signed   By: Donavan Foil M.D.   On: 09/02/2018 20:12   Dg Abd 1 View  Result Date: 09/02/2018 CLINICAL DATA:  NG tube placement. EXAM: ABDOMEN - 1 VIEW COMPARISON:  None. FINDINGS: An NG tube is identified with tip overlying the distal esophagus-recommend advancement. Gas-filled loops of bowel are again noted. IMPRESSION: NG tube with tip overlying the distal esophagus-recommend advancement. Electronically Signed   By: Margarette Canada M.D.   On: 09/02/2018 18:36   Dg Chest Port 1 View  Result Date: 09/03/2018 CLINICAL DATA:  Pneumothorax. EXAM: PORTABLE CHEST 1 VIEW COMPARISON:  09/02/2018 FINDINGS: Right IJ central venous catheter with tip over the SVC. Enteric tube courses into the stomach and off the inferior portion of the film as tip is not visualized. Left-sided pacemaker unchanged. Lungs are hypoinflated as the previously seen left pneumothorax is not visualized. There is hazy bilateral perihilar opacification without significant change likely mild interstitial edema. Likely small left effusion with associated basilar atelectasis unchanged. Stable moderate cardiomegaly. Remainder of the exam is unchanged. IMPRESSION: Stable moderate cardiomegaly with stable mild interstitial edema. Stable left base opacification likely effusion and associated basilar atelectasis. Previously noted left pneumothorax no longer visualized. Tubes and lines as described. Electronically Signed   By: Marin Olp M.D.   On: 09/03/2018 09:01   Dg Chest Port 1 View  Result Date: 09/02/2018 CLINICAL DATA:  Left-sided pneumothorax EXAM: PORTABLE CHEST 1 VIEW COMPARISON:  September 01, 2018 FINDINGS: Left-sided pneumothorax is again noted. This pneumothorax is seen in the apical and medial aspects on the left  but is no longer appreciable laterally. There is no tension component. There are pleural effusions bilaterally with patchy airspace consolidation in both lower lobes, more on the left than on the right. There is cardiomegaly with pulmonary vascularity normal. Central catheter tip is in the superior vena cava. 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 No evident adenopathy. No bone lesions. IMPRESSION: Persistent pneumothorax on the left without tension component. Bilateral lower lobe airspace consolidation and pleural effusions present. Stable cardiomegaly. Central catheter tip in superior vena cava. Pacemaker lead attached to right ventricle. Electronically Signed   By: Lowella Grip III M.D.   On: 09/02/2018 09:47   Dg Chest Port 1 View  Result Date: 09/01/2018 CLINICAL DATA:  Pneumothorax EXAM: PORTABLE CHEST 1 VIEW COMPARISON:  None. FINDINGS: Left AICD remains in place, unchanged. Left lateral and apical pneumothorax again noted, likely stable. Cardiomegaly. Focal airspace opacities in the lower lobes  are unchanged. IMPRESSION: Grossly stable left pneumothorax. Cardiomegaly. Bilateral lower lobe airspace opacities could reflect edema or pneumonia. Electronically Signed   By: Rolm Baptise M.D.   On: 09/01/2018 23:37   Dg Chest Port 1 View  Result Date: 09/01/2018 CLINICAL DATA:  Pneumothorax. EXAM: PORTABLE CHEST 1 VIEW COMPARISON:  Radiograph of September 01, 2018. FINDINGS: Stable cardiomegaly. Single lead left-sided pacemaker is unchanged in position. Right internal jugular catheter is unchanged in position. Grossly stable left pneumothorax is noted compared to prior exam. Mild bibasilar subsegmental atelectasis is noted. Bony thorax is unremarkable. IMPRESSION: Grossly stable left pneumothorax compared to prior exam. Electronically Signed   By: Marijo Conception, M.D.   On: 09/01/2018 16:34   Dg Chest Port 1 View  Result Date: 09/01/2018 CLINICAL DATA:  Pneumothorax suspected,  interval removal of the malpositioned OG tube EXAM: PORTABLE CHEST 1 VIEW COMPARISON:  09/01/2018 FINDINGS: Small left pneumothorax with suspected apical and basilar components. Apical pleural separation measures 13 mm. Right IJ central line tip upper SVC level. Single lead left subclavian defibrillator noted. Heart is enlarged. Diffuse airspace disease versus edema again noted with basilar atelectasis. Small pleural effusions noted. IMPRESSION: Small left pneumothorax. Stable cardiomegaly with diffuse edema versus airspace disease. Bibasilar atelectasis/partial consolidation and pleural effusions as well. These results will be called to the ordering clinician or representative by the Radiologist Assistant, and communication documented in the PACS or zVision Dashboard. Electronically Signed   By: Jerilynn Mages.  Shick M.D.   On: 09/01/2018 14:05   Dg Chest Port 1 View  Result Date: 09/01/2018 CLINICAL DATA:  Follow-up pneumonia. EXAM: PORTABLE CHEST 1 VIEW COMPARISON:  09/06/2018 and older exams. FINDINGS: Cardiac silhouette is mildly enlarged. Right internal jugular central venous line is stable, tip in the mid superior vena cava. There are thickened bronchovascular markings with hazy intervening airspace opacities, the latter most of it in the lung bases. Small pleural effusions. These findings are similar to the most recent prior study, the represent a change from an exam dated 05/26/2018. No pneumothorax. Stable left anterior chest wall AICD. IMPRESSION: 1. Findings consistent with congestive heart failure interstitial and mild hazy airspace pulmonary edema and small effusions. Findings similar to most recent prior exam. Electronically Signed   By: Lajean Manes M.D.   On: 09/01/2018 12:31   Dg Abd Portable 1v  Result Date: 09/01/2018 CLINICAL DATA:  Feeding tube placement EXAM: PORTABLE ABDOMEN - 1 VIEW COMPARISON:  09/01/2018 FINDINGS: Feeding tube extends within the left mainstem bronchus and into the left  lower lobe projects over the left basilar pleural space. Right IJ central line tip upper SVC.  Defibrillator noted. Cardiomegaly evident with diffuse interstitial opacities and small pleural effusions edema is favored versus pneumonia. Degenerative changes of the spine. Nonobstructive bowel gas pattern. IMPRESSION: Feeding tube extends into the left lower lobe but projects over the pleural space. Therefore, patient at risk for pneumothorax. Stable cardiomegaly, diffuse edema versus airspace disease, and small pleural effusions These results were called by telephone at the time of interpretation on 09/01/2018 at 1:14 pm to Dr. Marda Stalker , who verbally acknowledged these results. Electronically Signed   By: Jerilynn Mages.  Shick M.D.   On: 09/01/2018 13:14   Dg Abd Portable 1v  Result Date: 09/01/2018 CLINICAL DATA:  OG tube placement. EXAM: PORTABLE ABDOMEN - 1 VIEW COMPARISON:  Abdominal radiograph dated 02/04/2016 FINDINGS: The OG tube enters the left mainstem bronchus and extends inferiorly, laterally and probably into the posterior left costophrenic sulcus. The patient  has risk for pneumothorax after this tube is removed since it may be within the pleural space. Cardiomegaly. AICD in place. Patchy infiltrates at both lung bases which could represent pulmonary edema or pneumonia. There is suggestion of a central venous catheter with the tip at the level of the carina. IMPRESSION: 1. The OG tube is in the left lung and possibly extends into the pleural space. Follow-up chest x-ray is recommended after tube removal since the patient is at risk for pneumothorax as described above. 2. Bilateral pulmonary infiltrates suggesting pulmonary edema or pneumonia. Critical Value/emergent results were called by telephone at the time of interpretation on 09/01/2018 at 12:51 pm to [place], RN , who verbally acknowledged these results. Negative. Electronically Signed   By: Lorriane Shire M.D.   On: 09/01/2018 12:54   Medications:  I have reviewed the patient's current medications. Scheduled Meds: . chlorhexidine  15 mL Mouth Rinse BID  . mouth rinse  15 mL Mouth Rinse BID  . mouth rinse  15 mL Mouth Rinse q12n4p  . [START ON September 27, 2018] pantoprazole  40 mg Intravenous Q12H  . sodium chloride flush  10-40 mL Intracatheter Q12H  . sodium chloride flush  3 mL Intravenous Q12H   Continuous Infusions: . ceFEPime (MAXIPIME) IV 2 g (09/03/18 0235)  . dextrose 30 mL/hr at 09/03/18 0916  . DOBUTamine 2.5 mcg/kg/min (09/03/18 0919)  . metronidazole 500 mg (09/03/18 0610)  . norepinephrine (LEVOPHED) Adult infusion 20 mcg/min (09/03/18 0920)  . pantoprozole (PROTONIX) infusion 8 mg/hr (09/03/18 0228)  . vasopressin (PITRESSIN) infusion - *FOR SHOCK* 0.03 Units/min (09/03/18 0010)   PRN Meds:.albuterol, ondansetron **OR** ondansetron (ZOFRAN) IV, sodium chloride flush CBC Latest Ref Rng & Units 09/03/2018 09/02/2018 09/02/2018  WBC 4.0 - 10.5 K/uL 9.0 - -  Hemoglobin 13.0 - 17.0 g/dL 8.1(L) 7.8(L) 7.9(L)  Hematocrit 39.0 - 52.0 % 24.1(L) 23.3(L) 23.3(L)  Platelets 150 - 400 K/uL 101(L) - -     Assessment: Active Problems:   Anemia   Pneumonia due to infectious organism   Goals of care, counseling/discussion   Palliative care by specialist  Staci Acosta a 62 y.o.y/o malesevere CHF, CKDadmitted withSeptic shock,AMS,pneumoniia ,hypothermia. BNP over 4500, AKI anddrop in hemoglobin from baseline byabout 4 g.On plavix .  Hb stable.  Blood pressure still holding around 80 systolic. No overt bleeding  At this point of time he is not hemodynamically stable for any endoscopy evaluation.  If there is a concern for a GI bleed , get  a tagged RBC scan to locate source of bleed.        LOS: 3 days   Larry Bellows, MD 09/03/2018, 10:39 AM

## 2018-09-03 NOTE — Progress Notes (Addendum)
Daily Progress Note   Patient Name: [name]       Date: 09/03/2018 DOB: 10/17/1956  Age: 62 y.o. MRN#: [id number] Attending Physician: Max Sane, MD Primary Care Physician: Remi Haggard, FNP Admit Date: 08/18/2018  Reason for Consultation/Follow-up: Establishing goals of care, Non pain symptom management, Psychosocial/spiritual support, Terminal Care and Withdrawal of life-sustaining treatment  Subjective: Patient appears distressed - rapid, labored breathing and restless in bed. Does not open eyes or respond to me. Remains hypotensive. Kidney function continues to worsen.  Length of Stay: 3  Current Medications: Scheduled Meds:  . mouth rinse  15 mL Mouth Rinse BID    Continuous Infusions: . HYDROmorphone    . norepinephrine (LEVOPHED) Adult infusion 20 mcg/min (09/03/18 0920)  . vasopressin (PITRESSIN) infusion - *FOR SHOCK* 0.03 Units/min (09/03/18 0010)    PRN Meds: glycopyrrolate, [DISCONTINUED] haloperidol **OR** haloperidol **OR** haloperidol lactate, HYDROmorphone, [DISCONTINUED] LORazepam **OR** LORazepam **OR** LORazepam, [DISCONTINUED] ondansetron **OR** ondansetron (ZOFRAN) IV  Physical Exam         Constitutional: He appears unresponsive. He has a sickly appearance.  Cardiovascular: Normal rate and regular rhythm.  Pulmonary/Chest: Labored respirations, tachypneic, accessory muscle use, Rhonchus breath sounds Neurological: He appears restless. He is disoriented.  Non-verbal  Skin: Skin is warm and dry.  Psychiatric: Cognition and memory are impaired.   Vital Signs: BP (!) 85/64   Pulse 92   Temp 98.2 F (36.8 C)   Resp (!) 38   Ht 5\' 7"  (1.702 m)   Wt 62.8 kg   SpO2 100%   BMI 21.68 kg/m  SpO2: SpO2: 100 % O2 Device: O2 Device: High Flow Nasal Cannula O2  Flow Rate: O2 Flow Rate (L/min): 50 L/min  Intake/output summary:   Intake/Output Summary (Last 24 hours) at 09/03/2018 1139 Last data filed at 09/03/2018 1113 Gross per 24 hour  Intake 3412.42 ml  Output 597 ml  Net 2815.42 ml   LBM: Last BM Date: 09/01/18 Baseline Weight: Weight: 63.5 kg Most recent weight: Weight: 62.8 kg       Palliative Assessment/Data: PPS 10%    Flowsheet Rows     Most Recent Value  Intake Tab  Referral Department  Critical care  Unit at Time of Referral  ICU  Palliative Care Primary Diagnosis  Cardiac  Date Notified  09/01/18  Palliative Care Type  New Palliative care  Date of Admission  08/29/2018  Date first seen by Palliative Care  09/01/18  # of days Palliative referral response time  0 Day(s)  # of days IP prior to Palliative referral  1  Clinical Assessment  Palliative Performance Scale Score  20%  Psychosocial & Spiritual Assessment  Palliative Care Outcomes  Palliative Care Outcomes  Provided psychosocial or spiritual support, Counseled regarding hospice      Patient Active Problem List   Diagnosis Date Noted  . Pneumonia due to infectious organism   . Goals of care, counseling/discussion   . Palliative care by specialist   . FTT (failure to thrive) in adult 05/26/2018  . CHF (congestive heart failure) ([place])   . Dehydration, severe 05/25/2018  . Leukocytopenia 05/25/2018  . Bradycardia 05/25/2018  . Pressure injury of skin 05/08/2018  .  ARF (acute renal failure) ([place]) 05/05/2018  . Hypotension 03/09/2018  . Pancytopenia ([place]) 03/06/2018  . Troponin level elevated 03/06/2018  . Schizophrenia ([place]) 03/06/2018  . History of stroke 03/06/2018  . PVD (peripheral vascular disease) ([place]) 03/06/2018  . Type 2 diabetes mellitus without complication ([place]) 44/81/8563  . Protein-calorie malnutrition, severe 01/16/2017  . Acute hypernatremia 01/14/2017  . Collapse of lung   . Mucus plugging of bronchi   . Acute on chronic respiratory  failure ([place])   . Pressure ulcer 02/04/2016  . Right lower lobe pneumonia ([place]) 02/01/2016  . Septic shock ([place]) 02/01/2016  . Hypernatremia 02/01/2016  . Acute renal failure (ARF) ([place]) 02/01/2016  . Lactic acidosis 02/01/2016  . Metabolic encephalopathy 14/97/0263  . Thrombocytopenia ([place]) 02/01/2016  . Anemia 02/01/2016  . Sepsis ([place]) 02/01/2016    Palliative Care Assessment & Plan   HPI: 62 y.o. male  with past medical history of CHF (EF 15%), CKD3, T2DM, schizophrenia, bipolar disorder, PAD, and recurrent admissions for dehydration admitted on 08/25/2018 with lethargy. Patient found to be hypotensive and anemic with a hgb of 6.6. Required blood transfusion. Also found to have lactic acid of 2.2 and BNP >4500. Elevated LFTs. Creatinine rising - now at 2.18. AMS thought to be due to toxic metabolic encephalopathy and baseline of schizophrenia. History of strokes. Diagnosed with septic shock and pna - requiring vasopressors. PMT consulted by critical care for [place].  Assessment: Upon assessment this morning, patient has been transitioned to HFNC at 65% FiO2. Increased work of breathing. RR about 40/bpm. He appears agitated/restless. Kidney function worsening. Per nephrologist, even CRRT would pose significant risks and comfort care should be considered.  I spoke with patient's wife about assessment of patient. She tells me to please make him comfortable. She is going to call other family members and attempt to come to the hospital. I shared with her that time may be very short and expressed understanding. I discussed discontinuing care not aimed at comfort and she agreed. I offered to call her children but she tells me she will call. Per conversation with her Wednesday she did not want me to discuss [place] with children as she did not want to burden/overwhelm them.   We can continue levophed/vasopressin for now until family can come to hospital. However, will stop continuous fluids, dobutamine,  and protonix drips. Transition off of HFNC. Remove NG. Start dilaudid drip to ease work of breathing. Dose of robinul given now as gurgling noted during exam and ordered PRN. Also ordered PRN haldol and ativan.   Patient appeared significantly more comfortable after comfort measures were initiated.  Of note, Wednesday I offered a family meeting to the wife to include patient's children; however, she declined. She tells me this would be overwhelming to the children and she would like to be the one to make decisions for her husband.  Recommendations/Plan:  Transition to full comfort care - d/c HFNC, NG tube, fluids, protonix and dobutamine drips - initiate dilaudid drip, prn ativan, haldol, and robinul  Can continue levophed and vasopressin until family arrives - d/c once they are here  Patient's wife agreed to comfort measures  ADDENDUM: Received call from RN that pt's HR dropping, now 20-30 bpm. I called wife to provide update. She is still waiting on transportation to come but understands that he may pass away before family can arrive. She is thankful he is comfortable.   Code Status:  DNR  Prognosis:   Hours - Days  Discharge Planning:  Anticipated Hospital Death  Care plan was discussed with patient's wife, Moises Blood, Dr. Soyla Murphy, RN, case manager  Thank you for allowing the Palliative Medicine Team to assist in the care of this patient.   Total Time 65 minutes 1100-1205 Prolonged Time Billed  yes      Greater than 50%  of this time was spent counseling and coordinating care related to the above assessment and plan.  Juel Burrow, DNP, [facility] Palliative Medicine Team Team Phone # [phone]  Pager [phone]

## 2018-09-03 NOTE — Progress Notes (Signed)
Name: [name] MRN: [id number] DOB: 09/26/1956     CONSULTATION DATE: 08/23/2018  Subjective & objectives: Remains on norepinephrine + vasopressin + dobutamine + Protonix drip, nasal cannula and an altered mental status  PAST MEDICAL HISTORY :   has a past medical history of Anemia, Anginal pain ([place]), CHF (congestive heart failure) ([place]), Collapse of lung (2018), Decubitus skin ulcer (since 2016-17), Depression, Diabetes mellitus without complication ([place]), Discontinued smoking, Hypertension, Metabolic encephalopathy (4[place]), Peripheral vascular disease ([place]), Renal failure (ARF), acute on chronic ([place]), Schizophrenia ([place]), Sepsis ([place]) (2017), Stroke ([place]), Thrombocythemia ([place]) (2017), and Thyroid disease.  has a past surgical history that includes Cardiac catheterization (Left, 05/26/2016); Cardiac catheterization (05/26/2016); periphervascular procedures; and Cardiac catheterization (Right, 06/05/2016). Prior to Admission medications   Medication Sig Start Date End Date Taking? Authorizing Provider  aspirin 81 MG chewable tablet Chew 1 tablet (81 mg total) by mouth daily. 03/10/18  Yes Dhungel, Nishant, MD  atorvastatin (LIPITOR) 10 MG tablet Take 1 tablet (10 mg total) by mouth daily. 05/26/16  Yes Dew, Erskine Squibb, MD  B Complex-C (B-COMPLEX WITH VITAMIN C) tablet Take 1 tablet by mouth daily.   Yes [provider]  clopidogrel (PLAVIX) 75 MG tablet Take 1 tablet (75 mg total) by mouth daily. 03/09/18 03/09/19 Yes Dhungel, Nishant, MD  docusate sodium (COLACE) 100 MG capsule Take 100 mg by mouth 2 (two) times daily.   Yes [provider]  ferrous sulfate 325 (65 FE) MG tablet Take 325 mg by mouth daily with breakfast.   Yes [provider]  folic acid (FOLVITE) 1 MG tablet Take 1 mg by mouth daily.   Yes [provider]  [place] (DERMACLOUD) CREA Apply topically as needed (for pressure ulcers).   Yes [provider]  levETIRAcetam  (KEPPRA) 500 MG tablet Take 1 tablet (500 mg total) by mouth 2 (two) times daily. 03/09/18  Yes Dhungel, Nishant, MD  midodrine (PROAMATINE) 5 MG tablet Take 1 tablet (5 mg total) by mouth 2 (two) times daily with a meal. 03/09/18  Yes Dhungel, Nishant, MD  sacubitril-valsartan (ENTRESTO) 24-26 MG Take 1 tablet by mouth 2 (two) times daily.   Yes [provider]  sertraline (ZOLOFT) 50 MG tablet Take 50 mg by mouth at bedtime.   Yes [provider]   No Known Allergies  FAMILY HISTORY:  Family history is unknown by patient. SOCIAL HISTORY:  reports that he has been smoking cigarettes. He has a 30.00 pack-year smoking history. He has never used smokeless tobacco. He reports that he does not drink alcohol or use drugs.  REVIEW OF SYSTEMS:   Unable to obtain due to critical illness   VITAL SIGNS: Temp:  [97.3 F (36.3 C)-99.9 F (37.7 C)] 97.7 F (36.5 C) (10/25 1300) Pulse Rate:  [76-97] 86 (10/25 1300) Resp:  [10-43] 25 (10/25 1300) BP: (74-92)/(61-75) 85/64 (10/25 1100) SpO2:  [85 %-100 %] 96 % (10/25 1300) FiO2 (%):  [28 %-89 %] 28 % (10/25 1052) Weight:  [62.8 kg] 62.8 kg (10/25 0500)  Physical Examination: Obtunded, responds to verbal stimulation by eye opening. Doesn't track and not following commands On [place], no distress, AE on Lt. < Rt. And no rales S1 & S2 audible and no murmur Benign abdomen with feeble peristalses Wasted extremities and no edema   ASSESSMENT / PLAN:  Acute respiratory failure. Tolerating [place]. DNR -Family decided to proceed with comfort measures  Altered mental status with unresponsiveness due to toxic metabolic encephalopathy. Base  line schizophrenia and encephalopathy. CT head 04/2018 [place] Lt. Post parietal and watershed and old Lt. Central lacunar infarct.  Septic shock / cardiogenic etiology  -Norepinephrine + vasopressin + dobutamine.  Awaiting family arrival and plan to discontinue vasopressors as the patient has been  started on comfort measures.  Acute on chronic HFrEF. ECHO 09/01/2018 LVEF 20-25% with grade II diastolic dysfunction.  h/o dilated cardiomyopathy and EF of 15% on ECHO 04/2019s/p AICD.  Pneumonia and atelectasis. Improved Lt. L airspace disease -Cefep + Flagyl -Monitor CXR + CBC + FIO2  Lt. Pneumothorax after attempted placement of DHT (improved)  Metabolic acidosiswith lactic acidemia  AKI and hypernatremia(worsening)with ATN, intravascular volume depletion and hypoperfusion  UTI  PAD on ASA + Plavix. No acute ischemia  Elevated LFT with possible acute ischemic hepatitis  Anemia, HB drop requiring transfusion with no obvious bleeding.   Thrombocytopenia  Coagulopathy  DNR.Mrs Wymer decided to start with comfort measures as per palliative care.  The patient will continue on vasopressors awaiting family visitation.  DVT & GI prophylaxis. Continue with supportive care

## 2018-09-03 NOTE — Progress Notes (Signed)
[facility]  ROUNDING NOTE   Subjective:  Patient seen at bedside. Remains critically ill. Urine output was only 575 cc over the preceding 24 hours. Azotemia worsening.   Objective:  Vital signs in last 24 hours:  Temp:  [97.3 F (36.3 C)-100 F (37.8 C)] 98.4 F (36.9 C) (10/25 0930) Pulse Rate:  [76-97] 86 (10/25 0930) Resp:  [10-43] 23 (10/25 0930) BP: (74-92)/(61-75) 75/66 (10/25 0930) SpO2:  [85 %-100 %] 97 % (10/25 0930) FiO2 (%):  [65 %-89 %] 65 % (10/25 0730) Weight:  [62.8 kg] 62.8 kg (10/25 0500)  Weight change: 1.1 kg Filed Weights   09/01/18 0500 09/02/18 0404 09/03/18 0500  Weight: 61.2 kg 61.7 kg 62.8 kg    Intake/Output: I/O last 3 completed shifts: In: 3795.1 [I.V.:2409.8; NG/GT:100; IV Piggyback:1285.4] Out: 920 [Urine:920]   Intake/Output this shift:  Total I/O In: 285.7 [I.V.:285.7] Out: 22 [Urine:22]  Physical Exam: General: Critically ill appearing  Head: Normocephalic, atraumatic. Dry oral mucosal membranes  Eyes: Anicteric  Neck: Supple, trachea midline  Lungs:  Coarse rales and rhonchi bilateral.   Heart: S1S2 tachycardic  Abdomen:  Soft, nontender, bowel sounds present  Extremities: trace peripheral edema.  Neurologic: Awake not following commands  Skin: Hyperpigmentation b/l LE's       Basic Metabolic Panel: Recent Labs  Lab 09/01/18 0128 09/01/18 0440 09/01/18 0902 09/01/18 1515 09/02/18 0410 09/03/18 0503  NA 152* 151* 153* 151* 153* 153*  K 4.5 4.4 4.3 4.6 4.3 4.3  CL 122* 121* 120* 119* 122* 122*  CO2 18* 19* 22 19* 21* 18*  GLUCOSE 83 93 74 72 106* 169*  BUN 77* 75* 83* 86* 96* 101*  CREATININE 2.07* 2.08* 2.18* 2.26* 2.55* 2.86*  CALCIUM 8.2* 8.0* 7.8* 7.7* 7.6* 7.4*  MG 2.5*  --  2.4 2.4 2.4 2.5*  PHOS 6.5*  --  6.8* 7.4* 7.3* 7.7*    Liver Function Tests: Recent Labs  Lab 08/19/2018 1242 09/01/18 0440 09/02/18 0410 09/03/18 0503  AST 55* 83* 226* 213*  ALT 72* 87* 189* 201*  ALKPHOS 254*  236* 217* 189*  BILITOT 2.9* 5.0* 5.3* 7.6*  PROT 6.8 5.9* 5.6* 5.5*  ALBUMIN 3.9 3.5 3.2* 3.3*   No results for input(s): LIPASE, AMYLASE in the last 168 hours. Recent Labs  Lab 09/03/18 0509  AMMONIA 37*    CBC: Recent Labs  Lab 08/29/2018 1242  09/01/18 0440  09/01/18 1510 09/02/18 0410 09/02/18 1450 09/02/18 2350 09/03/18 0503  WBC 3.9*  --  6.6  --   --  8.0  --   --  9.0  NEUTROABS 2.4  --   --   --   --  6.8  --   --  8.5*  HGB 6.8*   < > 8.3*   < > 8.7* 7.9* 7.9* 7.8* 8.1*  HCT 21.2*   < > 24.3*   < > 25.7* 23.1* 23.3* 23.3* 24.1*  MCV 112.8*  --  103.4*  --   --  104.5*  --   --  106.2*  PLT 77*  --  82*  --   --  56*  --   --  101*   < > = values in this interval not displayed.    Cardiac Enzymes: Recent Labs  Lab 08/22/2018 1242  TROPONINI 0.06*    BNP: Invalid input(s): POCBNP  CBG: Recent Labs  Lab 08/27/2018 1752  GLUCAP 114*    Microbiology: Results for orders placed or performed during  the hospital encounter of 08/13/2018  Blood culture (routine x 2)     Status: None (Preliminary result)   Collection Time: 08/28/2018 12:44 PM  Result Value Ref Range Status   Specimen Description BLOOD RAC  Final   Special Requests   Final    BOTTLES DRAWN AEROBIC AND ANAEROBIC Blood Culture adequate volume   Culture   Final    NO GROWTH 3 DAYS Performed at [facility], [address] [place], [place] [address]    Report Status PENDING  Incomplete  Urine culture     Status: None   Collection Time: 09/08/2018 12:44 PM  Result Value Ref Range Status   Specimen Description   Final    URINE, RANDOM Performed at [facility], [address], [place], [place] [address]    Special Requests   Final    NONE Performed at [facility], [address] [place], [place] [address]    Culture   Final    NO GROWTH Performed at [facility], [place] [address], [place], [place] [address]    Report Status 09/01/2018 FINAL  Final  Blood  culture (routine x 2)     Status: None (Preliminary result)   Collection Time: 08/22/2018 12:49 PM  Result Value Ref Range Status   Specimen Description BLOOD LFA  Final   Special Requests   Final    BOTTLES DRAWN AEROBIC AND ANAEROBIC Blood Culture results may not be optimal due to an inadequate volume of blood received in culture bottles   Culture   Final    NO GROWTH 3 DAYS Performed at [facility], [address], [place], [place] [address]    Report Status PENDING  Incomplete  MRSA PCR Screening     Status: None   Collection Time: 09/03/2018  6:06 PM  Result Value Ref Range Status   MRSA by PCR NEGATIVE NEGATIVE Final    Comment:        The GeneXpert MRSA Assay (FDA approved for NASAL specimens only), is one component of a comprehensive MRSA colonization surveillance program. It is not intended to diagnose MRSA infection nor to guide or monitor treatment for MRSA infections. Performed at [facility], [address] [place],  [address]     Coagulation Studies: Recent Labs    09/09/2018 1918 09/02/18 1450  LABPROT 21.7* 34.8*  INR 1.92 3.53    Urinalysis: Recent Labs    08/30/2018 1244 08/22/2018 1432  COLORURINE AMBER* AMBER*  LABSPEC 1.012 1.012  PHURINE 5.0 5.0  GLUCOSEU NEGATIVE NEGATIVE  HGBUR NEGATIVE LARGE*  BILIRUBINUR NEGATIVE NEGATIVE  KETONESUR NEGATIVE NEGATIVE  PROTEINUR NEGATIVE 30*  NITRITE NEGATIVE NEGATIVE  LEUKOCYTESUR NEGATIVE TRACE*      Imaging: Dg Abd 1 View  Result Date: 09/02/2018 CLINICAL DATA:  Evaluate NG tube placement EXAM: ABDOMEN - 1 VIEW COMPARISON:  September 02, 2017 FINDINGS: The NG tube appears to coil in the stomach with the distal tip in the fundus. Decreasing opacity in the right lung base. No other change. IMPRESSION: The NG tube appears to be in good position, likely terminating in the gastric fundus. Improving right basilar infiltrate. Electronically Signed   By: Dorise Bullion III M.D   On:  09/02/2018 21:58   Dg Abd 1 View  Result Date: 09/02/2018 CLINICAL DATA:  NG tube placement EXAM: ABDOMEN - 1 VIEW COMPARISON:  09/02/2018, 09/01/2018, 02/05/2016 FINDINGS: Esophageal tube tip projects over the distal esophageal region. Dense airspace disease within  the bilateral lung bases with some worsening on the right. Cardiomegaly. IMPRESSION: 1. Esophageal tube tip overlies the distal esophagus 2. Cardiomegaly. Dense bibasilar airspace disease with suspected worsening at the right base. Electronically Signed   By: Donavan Foil M.D.   On: 09/02/2018 20:12   Dg Abd 1 View  Result Date: 09/02/2018 CLINICAL DATA:  NG tube placement. EXAM: ABDOMEN - 1 VIEW COMPARISON:  None. FINDINGS: An NG tube is identified with tip overlying the distal esophagus-recommend advancement. Gas-filled loops of bowel are again noted. IMPRESSION: NG tube with tip overlying the distal esophagus-recommend advancement. Electronically Signed   By: Margarette Canada M.D.   On: 09/02/2018 18:36   Dg Chest Port 1 View  Result Date: 09/03/2018 CLINICAL DATA:  Pneumothorax. EXAM: PORTABLE CHEST 1 VIEW COMPARISON:  09/02/2018 FINDINGS: Right IJ central venous catheter with tip over the SVC. Enteric tube courses into the stomach and off the inferior portion of the film as tip is not visualized. Left-sided pacemaker unchanged. Lungs are hypoinflated as the previously seen left pneumothorax is not visualized. There is hazy bilateral perihilar opacification without significant change likely mild interstitial edema. Likely small left effusion with associated basilar atelectasis unchanged. Stable moderate cardiomegaly. Remainder of the exam is unchanged. IMPRESSION: Stable moderate cardiomegaly with stable mild interstitial edema. Stable left base opacification likely effusion and associated basilar atelectasis. Previously noted left pneumothorax no longer visualized. Tubes and lines as described. Electronically Signed   By: Marin Olp  M.D.   On: 09/03/2018 09:01   Dg Chest Port 1 View  Result Date: 09/02/2018 CLINICAL DATA:  Left-sided pneumothorax EXAM: PORTABLE CHEST 1 VIEW COMPARISON:  September 01, 2018 FINDINGS: Left-sided pneumothorax is again noted. This pneumothorax is seen in the apical and medial aspects on the left but is no longer appreciable laterally. There is no tension component. There are pleural effusions bilaterally with patchy airspace consolidation in both lower lobes, more on the left than on the right. There is cardiomegaly with pulmonary vascularity normal. Central catheter tip is in the superior vena cava. 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 No evident adenopathy. No bone lesions. IMPRESSION: Persistent pneumothorax on the left without tension component. Bilateral lower lobe airspace consolidation and pleural effusions present. Stable cardiomegaly. Central catheter tip in superior vena cava. Pacemaker lead attached to right ventricle. Electronically Signed   By: Lowella Grip III M.D.   On: 09/02/2018 09:47   Dg Chest Port 1 View  Result Date: 09/01/2018 CLINICAL DATA:  Pneumothorax EXAM: PORTABLE CHEST 1 VIEW COMPARISON:  None. FINDINGS: Left AICD remains in place, unchanged. Left lateral and apical pneumothorax again noted, likely stable. Cardiomegaly. Focal airspace opacities in the lower lobes are unchanged. IMPRESSION: Grossly stable left pneumothorax. Cardiomegaly. Bilateral lower lobe airspace opacities could reflect edema or pneumonia. Electronically Signed   By: Rolm Baptise M.D.   On: 09/01/2018 23:37   Dg Chest Port 1 View  Result Date: 09/01/2018 CLINICAL DATA:  Pneumothorax. EXAM: PORTABLE CHEST 1 VIEW COMPARISON:  Radiograph of September 01, 2018. FINDINGS: Stable cardiomegaly. Single lead left-sided pacemaker is unchanged in position. Right internal jugular catheter is unchanged in position. Grossly stable left pneumothorax is noted compared to prior exam. Mild bibasilar  subsegmental atelectasis is noted. Bony thorax is unremarkable. IMPRESSION: Grossly stable left pneumothorax compared to prior exam. Electronically Signed   By: Marijo Conception, M.D.   On: 09/01/2018 16:34   Dg Chest Port 1 View  Result Date: 09/01/2018 CLINICAL DATA:  Pneumothorax  suspected, interval removal of the malpositioned OG tube EXAM: PORTABLE CHEST 1 VIEW COMPARISON:  09/01/2018 FINDINGS: Small left pneumothorax with suspected apical and basilar components. Apical pleural separation measures 13 mm. Right IJ central line tip upper SVC level. Single lead left subclavian defibrillator noted. Heart is enlarged. Diffuse airspace disease versus edema again noted with basilar atelectasis. Small pleural effusions noted. IMPRESSION: Small left pneumothorax. Stable cardiomegaly with diffuse edema versus airspace disease. Bibasilar atelectasis/partial consolidation and pleural effusions as well. These results will be called to the ordering clinician or representative by the Radiologist Assistant, and communication documented in the PACS or zVision Dashboard. Electronically Signed   By: Jerilynn Mages.  Shick M.D.   On: 09/01/2018 14:05   Dg Chest Port 1 View  Result Date: 09/01/2018 CLINICAL DATA:  Follow-up pneumonia. EXAM: PORTABLE CHEST 1 VIEW COMPARISON:  08/10/2018 and older exams. FINDINGS: Cardiac silhouette is mildly enlarged. Right internal jugular central venous line is stable, tip in the mid superior vena cava. There are thickened bronchovascular markings with hazy intervening airspace opacities, the latter most of it in the lung bases. Small pleural effusions. These findings are similar to the most recent prior study, the represent a change from an exam dated 05/26/2018. No pneumothorax. Stable left anterior chest wall AICD. IMPRESSION: 1. Findings consistent with congestive heart failure interstitial and mild hazy airspace pulmonary edema and small effusions. Findings similar to most recent prior exam.  Electronically Signed   By: Lajean Manes M.D.   On: 09/01/2018 12:31   Dg Abd Portable 1v  Result Date: 09/01/2018 CLINICAL DATA:  Feeding tube placement EXAM: PORTABLE ABDOMEN - 1 VIEW COMPARISON:  09/01/2018 FINDINGS: Feeding tube extends within the left mainstem bronchus and into the left lower lobe projects over the left basilar pleural space. Right IJ central line tip upper SVC.  Defibrillator noted. Cardiomegaly evident with diffuse interstitial opacities and small pleural effusions edema is favored versus pneumonia. Degenerative changes of the spine. Nonobstructive bowel gas pattern. IMPRESSION: Feeding tube extends into the left lower lobe but projects over the pleural space. Therefore, patient at risk for pneumothorax. Stable cardiomegaly, diffuse edema versus airspace disease, and small pleural effusions These results were called by telephone at the time of interpretation on 09/01/2018 at 1:14 pm to Dr. Marda Stalker , who verbally acknowledged these results. Electronically Signed   By: Jerilynn Mages.  Shick M.D.   On: 09/01/2018 13:14   Dg Abd Portable 1v  Result Date: 09/01/2018 CLINICAL DATA:  OG tube placement. EXAM: PORTABLE ABDOMEN - 1 VIEW COMPARISON:  Abdominal radiograph dated 02/04/2016 FINDINGS: The OG tube enters the left mainstem bronchus and extends inferiorly, laterally and probably into the posterior left costophrenic sulcus. The patient has risk for pneumothorax after this tube is removed since it may be within the pleural space. Cardiomegaly. AICD in place. Patchy infiltrates at both lung bases which could represent pulmonary edema or pneumonia. There is suggestion of a central venous catheter with the tip at the level of the carina. IMPRESSION: 1. The OG tube is in the left lung and possibly extends into the pleural space. Follow-up chest x-ray is recommended after tube removal since the patient is at risk for pneumothorax as described above. 2. Bilateral pulmonary infiltrates suggesting  pulmonary edema or pneumonia. Critical Value/emergent results were called by telephone at the time of interpretation on 09/01/2018 at 12:51 pm to [place], RN , who verbally acknowledged these results. Negative. Electronically Signed   By: Lorriane Shire M.D.   On: 09/01/2018 12:54  Medications:   . ceFEPime (MAXIPIME) IV 2 g (09/03/18 0235)  . dextrose 30 mL/hr at 09/03/18 0916  . DOBUTamine 2.5 mcg/kg/min (09/03/18 0919)  . metronidazole 500 mg (09/03/18 0610)  . norepinephrine (LEVOPHED) Adult infusion 20 mcg/min (09/03/18 0920)  . pantoprozole (PROTONIX) infusion 8 mg/hr (09/03/18 0228)  . vasopressin (PITRESSIN) infusion - *FOR SHOCK* 0.03 Units/min (09/03/18 0010)   . chlorhexidine  15 mL Mouth Rinse BID  . mouth rinse  15 mL Mouth Rinse BID  . mouth rinse  15 mL Mouth Rinse q12n4p  . [START ON 09-09-2018] pantoprazole  40 mg Intravenous Q12H  . sodium chloride flush  10-40 mL Intracatheter Q12H  . sodium chloride flush  3 mL Intravenous Q12H   albuterol, ondansetron **OR** ondansetron (ZOFRAN) IV, sodium chloride flush  Assessment/ Plan:  62 y.o. male with medical problems of schizophrenia, CVA, PVD, HTN, severe HF with EF 10% who presents now with acute respiratory failure, sepsis, acute renal failure, hypernatremia.   1.  Hypernatremia.  Serum sodium remains high at 153 despite D5W.  We will attempt to increase the rate a bit to 50 cc/h.  2.  Acute renal failure.  Renal function worsening.  BUN up to 101 with a creatinine of 2.86.  Urine output was 575 cc over the preceding 24 hours.  Blood pressure still low at 75/66 despite multiple pressors.  Even CRRT would pose significant risks to him in this setting.  Recommend consideration of comfort care.   3.  Acute respiratory failure.  [place] on nasal cannula.  He is currently DNR status.  4.  Hypotension/septic shock.  Patient remains hypotensive with a blood pressure 75/66 despite multiple pressors.  Overall prognosis quite  guarded.   LOS: 3 [name] 10/25/20199:55 AM

## 2018-09-03 NOTE — Progress Notes (Signed)
[facility] NAME: [name]    MR#:  [id number]  DATE OF BIRTH:  11-29-1955  SUBJECTIVE:  CHIEF COMPLAINT:   Chief Complaint  Patient presents with  . Fatigue  remains critically sick, getting progressively more azotemic, hypotensive REVIEW OF SYSTEMS:  Review of Systems  Unable to perform ROS: Mental acuity   DRUG ALLERGIES:  No Known Allergies VITALS:  Blood pressure (!) 79/62, pulse 93, temperature 98.4 F (36.9 C), resp. rate (!) 36, height 5\' 7"  (1.702 m), weight 62.8 kg, SpO2 99 %. PHYSICAL EXAMINATION:  Physical Exam  Constitutional: He appears lethargic. He appears cachectic. He appears toxic. He has a sickly appearance.  HENT:  Head: Normocephalic and atraumatic.  Eyes: Pupils are equal, round, and reactive to light. Conjunctivae and EOM are normal.  Neck: Normal range of motion. Neck supple. No tracheal deviation present. No thyromegaly present.  Cardiovascular: Normal rate, regular rhythm and normal heart sounds.  Pulmonary/Chest: Effort normal and breath sounds normal. No respiratory distress. He has no wheezes. He exhibits no tenderness.  Abdominal: Soft. Bowel sounds are normal. He exhibits no distension. There is no tenderness.  Musculoskeletal: Normal range of motion.  Neurological: He appears lethargic. He is disoriented. No cranial nerve deficit.  Skin: Skin is warm and dry. No rash noted.  Psychiatric:  Difficult to evaluate due to his current mental status   LABORATORY PANEL:  Male CBC Recent Labs  Lab 09/03/18 0503  WBC 9.0  HGB 8.1*  HCT 24.1*  PLT 101*   ------------------------------------------------------------------------------------------------------------------ Chemistries  Recent Labs  Lab 09/03/18 0503  NA 153*  K 4.3  CL 122*  CO2 18*  GLUCOSE 169*  BUN 101*  CREATININE 2.86*  CALCIUM 7.4*  MG 2.5*  AST 213*  ALT 201*  ALKPHOS 189*  BILITOT 7.6*   RADIOLOGY:    Dg Abd 1 View  Result Date: 09/02/2018 CLINICAL DATA:  Evaluate NG tube placement EXAM: ABDOMEN - 1 VIEW COMPARISON:  September 02, 2017 FINDINGS: The NG tube appears to coil in the stomach with the distal tip in the fundus. Decreasing opacity in the right lung base. No other change. IMPRESSION: The NG tube appears to be in good position, likely terminating in the gastric fundus. Improving right basilar infiltrate. Electronically Signed   By: Dorise Bullion III M.D   On: 09/02/2018 21:58   Dg Abd 1 View  Result Date: 09/02/2018 CLINICAL DATA:  NG tube placement EXAM: ABDOMEN - 1 VIEW COMPARISON:  09/02/2018, 09/01/2018, 02/05/2016 FINDINGS: Esophageal tube tip projects over the distal esophageal region. Dense airspace disease within the bilateral lung bases with some worsening on the right. Cardiomegaly. IMPRESSION: 1. Esophageal tube tip overlies the distal esophagus 2. Cardiomegaly. Dense bibasilar airspace disease with suspected worsening at the right base. Electronically Signed   By: Donavan Foil M.D.   On: 09/02/2018 20:12   Dg Abd 1 View  Result Date: 09/02/2018 CLINICAL DATA:  NG tube placement. EXAM: ABDOMEN - 1 VIEW COMPARISON:  None. FINDINGS: An NG tube is identified with tip overlying the distal esophagus-recommend advancement. Gas-filled loops of bowel are again noted. IMPRESSION: NG tube with tip overlying the distal esophagus-recommend advancement. Electronically Signed   By: Margarette Canada M.D.   On: 09/02/2018 18:36   Dg Chest Port 1 View  Result Date: 09/03/2018 CLINICAL DATA:  Pneumothorax. EXAM: PORTABLE CHEST 1 VIEW COMPARISON:  09/02/2018 FINDINGS: Right IJ central venous catheter with tip over the  SVC. Enteric tube courses into the stomach and off the inferior portion of the film as tip is not visualized. Left-sided pacemaker unchanged. Lungs are hypoinflated as the previously seen left pneumothorax is not visualized. There is hazy bilateral perihilar opacification without  significant change likely mild interstitial edema. Likely small left effusion with associated basilar atelectasis unchanged. Stable moderate cardiomegaly. Remainder of the exam is unchanged. IMPRESSION: Stable moderate cardiomegaly with stable mild interstitial edema. Stable left base opacification likely effusion and associated basilar atelectasis. Previously noted left pneumothorax no longer visualized. Tubes and lines as described. Electronically Signed   By: Marin Olp M.D.   On: 09/03/2018 09:01   ASSESSMENT AND PLAN:  62 y.o. male with a known history of chronic systolic CHF with ejection fraction 15%, diabetes mellitus, CKD 3, schizophrenia, recurrent admissions for dehydration and hyponatremia presents to the hospital sent in from his group home due to lethargy  * Septic shock with multiorgan failure - likely due to PNA, await culture results - requiring pressors - continue cefepime and Metro  *GI bleed with acute blood loss anemia: hb 8.1 today. s/p 1 unit packed RBC.  continue Protonix IV.  appreciate GI input.  Transfuse to keep hemoglobin greater than 7. - no obvious source of bleeding - if Hb drops - may need bleeding scan  *Acute metabolic encephalopathy due to sepsis with underlying schizophrenia   *Acute kidney injury over CKD stage III.  Due to ATN from hypotension and severe sepsis.  Monitor input and output.  Fluid resuscitation.  *Acute on Chronic systolic CHF with ejection fraction 15% s/p AICD.  Monitor for fluid overload.  * Acute hypoxic resp failure: due to pna & atelectasis. on [place] o2 - continue Abx as above  * Acute Metabolic acidosis - improving   Overall poor prognosis, may not survive.  Consider Hospice, appreciate palliative care efforts. Family not quite agreeable and responsive.   All the records are reviewed and case discussed with Care Management/Social Worker. Management plans discussed with the patient, ICU attending and nursing and they are  in agreement.  CODE STATUS: DNR  TOTAL TIME TAKING CARE OF THIS PATIENT: 15 minutes.   More than 50% of the time was spent in counseling/coordination of care: YES  Poor prognosis   Max Sane M.D on 09/03/2018 at 10:30 AM  Between 7am to 6pm - Pager - [phone]  After 6pm go to [email or web address] - Proofreader  [facility]  Office  [phone]  CC: Primary care physician; Remi Haggard, FNP  Note: This dictation was prepared with Dragon dictation along with smaller phrase technology. Any transcriptional errors that result from this process are unintentional.

## 2018-09-03 NOTE — Progress Notes (Signed)
Palliative Medicine RN Note:  Rec'd call from Kathie Rhodes, DNP with PMT. Pt has [employer] AICD that needs to be deactivated d/t now being comfort care & order on chart to deactivate. Per web search, contact is [phone].   I spoke with Beverlee Nims, who took the patient's information and will have the local rep call me. They will need order faxed to them, and it is printed and ready to be faxed.  Marjie Skiff Johannah Rozas, RN, BSN, [facility] Palliative Medicine Team 09/03/2018 11:12 AM Office [phone]    ADDENDUM: Spoke with [employer] rep; they will be here later tonight and recommend placing a magnet until they can arrive. If the patient expires before they get here, please call them at [phone], as it's a difficult trip to coordinate to [facility]. Updated NP Shae.  Marjie Skiff Konstantina Nachreiner, RN, BSN, [facility] Palliative Medicine Team 09/03/2018 11:28 AM Office [phone]

## 2018-09-03 NOTE — Clinical Social Work Note (Signed)
CSW has been informed that patient will be made comfort measures and a terminal wean will be done. Shela Leff MSW,LcSW [phone]

## 2018-09-03 NOTE — Progress Notes (Signed)
Patient is comfort care and appears to be, resting quietly with eyes closed. Dilaudid, Levophed and Vasopressin  continues to infuse per order. Foley catheter continues to drain dark yellow urine. Per shift report we are waiting for wife to arrive before we discontinue Levophed or Vasopressin. 6 of 8 children were at bedside prior to shift change. No family present at bedside currently.

## 2018-09-04 DIAGNOSIS — R4182 Altered mental status, unspecified: Secondary | ICD-10-CM

## 2018-09-04 LAB — PREPARE PLATELET PHERESIS: UNIT DIVISION: 0

## 2018-09-04 LAB — BPAM PLATELET PHERESIS
Blood Product Expiration Date: 201910252359
ISSUE DATE / TIME: 201910250028
Unit Type and Rh: 6200

## 2018-09-04 LAB — CALCIUM, IONIZED: CALCIUM, IONIZED, SERUM: 3.9 mg/dL — AB (ref 4.5–5.6)

## 2018-09-05 LAB — CULTURE, BLOOD (ROUTINE X 2)
CULTURE: NO GROWTH
Culture: NO GROWTH
Special Requests: ADEQUATE

## 2018-09-07 NOTE — Progress Notes (Signed)
Received fax from [facility] to sign Death certificate and faxed back to them for cremation. They will bring original Death Cert.

## 2018-09-08 ENCOUNTER — Telehealth: Payer: Self-pay

## 2018-09-08 NOTE — Telephone Encounter (Signed)
Recieved Death Certificate from [facility] ________ Delivered/Placed ___in nurse box_________

## 2018-09-09 NOTE — Telephone Encounter (Signed)
Original death certificate received and completed. Placed at front desk. [facility] aware.

## 2018-09-10 NOTE — Progress Notes (Signed)
Patient is made comfort care, expired at 9:50 AM.

## 2018-09-10 NOTE — Progress Notes (Signed)
Patient expired [phone] with this RN and MD Samaan at bedside. All infusions discontinued. MD Samaan to notify wife. CDS notified.

## 2018-09-10 NOTE — Progress Notes (Signed)
   09/25/2018 1145  Clinical Encounter Type  Visited With Family;Health care provider  Visit Type Death  Referral From Nurse  Consult/Referral To Chaplain   Chaplain responded to page to support family after patient death.  Family expressed desire for time alone and declined chaplain visit.  Chaplain offered to follow up later then checked in with unit staff.

## 2018-09-10 NOTE — Discharge Summary (Signed)
Name: [name] MRN: [id number] DOB: 1956-07-08     CONSULTATION DATE: 08/24/2018    HISTORY OF PRESENT ILLNESS:   [name] is 15 y old man with h/o chronic systolic CHF with ejection fraction 15%, diabetes mellitus, CKD 3, schizophrenia, recurrent admissions for dehydration and hyponatremia. Patient presented to ED form group residence because of lethargy.  CODE STATUS was DNR, while in intensive care unit he continued to be on high FiO2, requiring vasopressors with worsening kidney function, decompensated heart failure was baseline severe cardiomyopathy and questionable GI bleed.  [name] agreed to move to comfort care on 09/03/2018 after obtaining a palliative consult.  The patient had expired on 10/02/18. PAST MEDICAL HISTORY :   has a past medical history of Anemia, Anginal pain ([place]), CHF (congestive heart failure) ([place]), Collapse of lung (2018), Decubitus skin ulcer (since 2016-17), Depression, Diabetes mellitus without complication ([place]), Discontinued smoking, Hypertension, Metabolic encephalopathy (6283), Peripheral vascular disease ([place]), Renal failure (ARF), acute on chronic ([place]), Schizophrenia ([place]), Sepsis ([place]) (2017), Stroke ([place]), Thrombocythemia ([place]) (2017), and Thyroid disease.  has a past surgical history that includes Cardiac catheterization (Left, 05/26/2016); Cardiac catheterization (05/26/2016); periphervascular procedures; and Cardiac catheterization (Right, 06/05/2016). Prior to Admission medications   Medication Sig Start Date End Date Taking? Authorizing Provider  aspirin 81 MG chewable tablet Chew 1 tablet (81 mg total) by mouth daily. 03/10/18  Yes Dhungel, Nishant, MD  atorvastatin (LIPITOR) 10 MG tablet Take 1 tablet (10 mg total) by mouth daily. 05/26/16  Yes Dew, Erskine Squibb, MD  B Complex-C (B-COMPLEX WITH VITAMIN C) tablet Take 1 tablet by mouth daily.   Yes [provider]  clopidogrel (PLAVIX) 75 MG tablet Take 1 tablet (75 mg total) by mouth daily.  03/09/18 03/09/19 Yes Dhungel, Nishant, MD  docusate sodium (COLACE) 100 MG capsule Take 100 mg by mouth 2 (two) times daily.   Yes [provider]  ferrous sulfate 325 (65 FE) MG tablet Take 325 mg by mouth daily with breakfast.   Yes [provider]  folic acid (FOLVITE) 1 MG tablet Take 1 mg by mouth daily.   Yes [provider]  Bazine (DERMACLOUD) CREA Apply topically as needed (for pressure ulcers).   Yes [provider]  levETIRAcetam (KEPPRA) 500 MG tablet Take 1 tablet (500 mg total) by mouth 2 (two) times daily. 03/09/18  Yes Dhungel, Nishant, MD  midodrine (PROAMATINE) 5 MG tablet Take 1 tablet (5 mg total) by mouth 2 (two) times daily with a meal. 03/09/18  Yes Dhungel, Nishant, MD  sacubitril-valsartan (ENTRESTO) 24-26 MG Take 1 tablet by mouth 2 (two) times daily.   Yes [provider]  sertraline (ZOLOFT) 50 MG tablet Take 50 mg by mouth at bedtime.   Yes [provider]   No Known Allergies  FAMILY HISTORY:  Family history is unknown by patient. SOCIAL HISTORY:  reports that he has been smoking cigarettes. He has a 30.00 pack-year smoking history. He has never used smokeless tobacco. He reports that he does not drink alcohol or use drugs.  REVIEW OF SYSTEMS:   Unable to obtain due to critical illness   VITAL SIGNS: Temp:  [97.1 F (36.2 C)-98.9 F (37.2 C)] 98.7 F (37.1 C) (10/26 0943) Pulse Rate:  [46-110] 46 (10/26 0600) Resp:  [5-38] 10 (10/26 0900) BP: (85)/(64) 85/64 (10/25 1100) SpO2:  [91 %-100 %] 91 % (10/26 0600) FiO2 (%):  [28 %] 28 % (10/25 1052)  Last physical Examination: Unresponsive  in no distress On [place], no distress, AE on Lt. < Rt. And no rales S1 & S2 audible and no murmur Benign abdomen with feeble peristalses Wasted extremities and no edema   Course of admission:  Acute respiratory failure. Tolerating [place]. DNR -Family decided to proceed with comfort measures  Altered  mental status with unresponsiveness due to toxic metabolic encephalopathy. Base line schizophrenia and encephalopathy. CT head 04/2018 [place] Lt. Post parietal and watershed and old Lt. Central lacunar infarct.  Septic shock / cardiogenic etiology  -Norepinephrine + vasopressin + dobutamine.  Awaiting family arrival and plan to discontinue vasopressors as the patient has been started on comfort measures.  Acute on chronic HFrEF.ECHO 09/01/2018 LVEF 20-25% with grade II diastolic dysfunction.h/o dilated cardiomyopathy and EF of 15% on ECHO 04/2019s/p AICD.  Pneumonia and atelectasis.ImprovedLt. L airspace disease  Lt. Pneumothorax after attempted placement of DHT (improved)  Metabolic acidosiswith lactic acidemia  AKI and hypernatremia(worsening)with ATN, intravascular volume depletion and hypoperfusion  UTI  PAD. No acute ischemia  Elevated LFT with possible acute ischemic hepatitis  Anemia, HB droprequiring transfusion with no obvious bleeding.   Thrombocytopenia  Coagulopathy  DNR.started on comfort measures on 09/03/2018.  Patient expired on 10/20 04/2018 at 9:50 AM.  [name] (his wife) was notified.

## 2018-09-10 NOTE — Progress Notes (Signed)
Patient resting comfortably, in no distress. Wife's number provided to MD Samaan to get update on her arrival. Vasopressin and Levophed continue to infuse at this time. Will continue to monitor.

## 2018-09-10 NOTE — Plan of Care (Signed)
Patient continues to be on comfort care. Dilaudid, Levophed, and Vasopressin continue to infuse per MD order. Foley continues to drain dark yellow urine. Patient being repositioned q 2 hrs. Oral care per order. Per shift report, wife to come in and see patient this shift, keep Levophed and vasopressin infusing until that time. No family present this shift.

## 2018-09-10 DEATH — deceased

## 2018-12-30 ENCOUNTER — Other Ambulatory Visit: Payer: Medicare Other

## 2018-12-30 ENCOUNTER — Ambulatory Visit: Payer: Medicare Other | Admitting: Oncology

## 2019-06-10 IMAGING — DX DG ABDOMEN 1V
1 series · 1 of 1 positions shown · non-contrast
Comparison: 09/02/2018, 09/01/2018, 02/05/2016

CLINICAL DATA: NG tube placement

EXAM:
ABDOMEN - 1 VIEW

[abdomen supine]
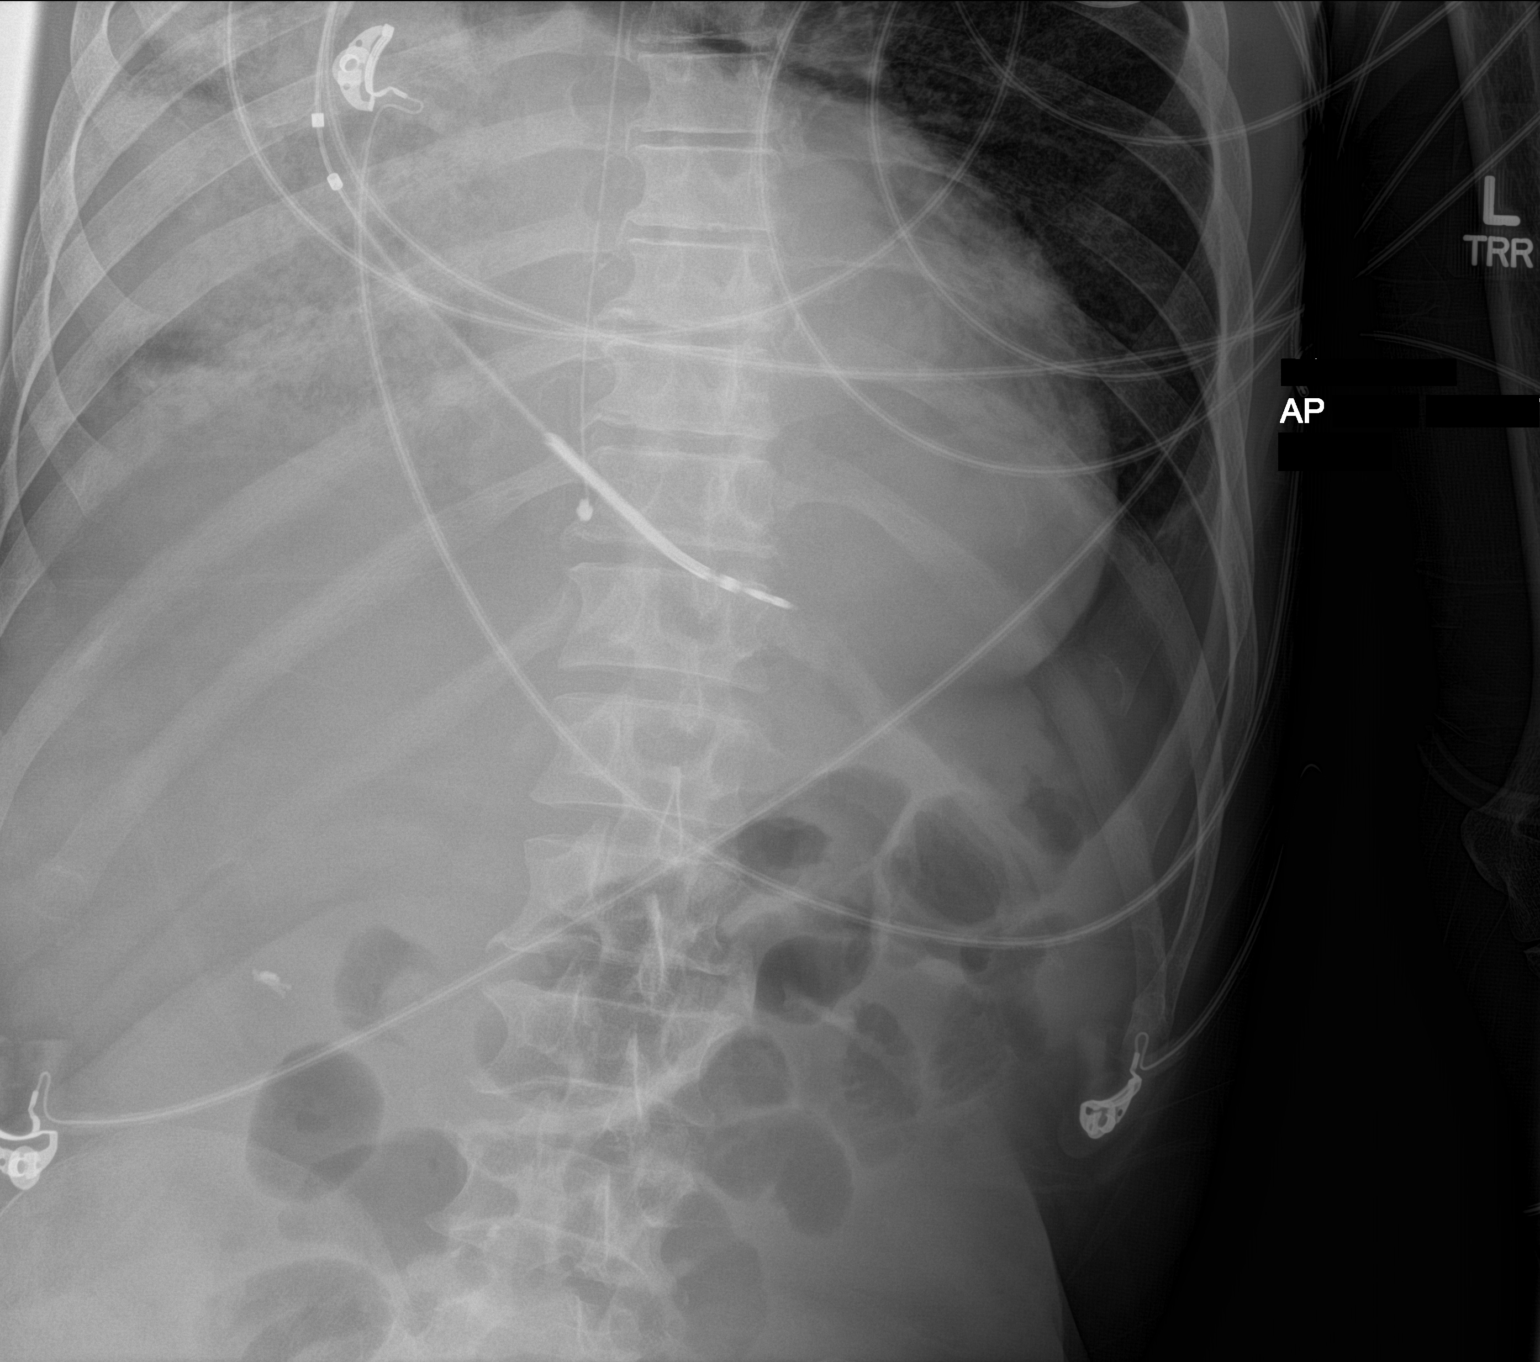

[1 of 1 positions shown; findings below may reference images not displayed]

FINDINGS: Esophageal tube tip projects over the distal esophageal region.
Dense airspace disease within the bilateral lung bases with some
worsening on the right. Cardiomegaly.
IMPRESSION: 1. Esophageal tube tip overlies the distal esophagus
2. Cardiomegaly. Dense bibasilar airspace disease with suspected
worsening at the right base.
